# Patient Record
Sex: Female | Born: 1950
Health system: Southern US, Community
[De-identification: ages and names within clinical notes are randomized; demographics above are authoritative.]

## PROBLEM LIST (undated history)

## (undated) DIAGNOSIS — Z9119 Patient's noncompliance with other medical treatment and regimen: Secondary | ICD-10-CM

## (undated) DIAGNOSIS — K08109 Complete loss of teeth, unspecified cause, unspecified class: Secondary | ICD-10-CM

## (undated) DIAGNOSIS — R5383 Other fatigue: Secondary | ICD-10-CM

## (undated) DIAGNOSIS — E119 Type 2 diabetes mellitus without complications: Secondary | ICD-10-CM

## (undated) DIAGNOSIS — M549 Dorsalgia, unspecified: Secondary | ICD-10-CM

## (undated) DIAGNOSIS — R1011 Right upper quadrant pain: Secondary | ICD-10-CM

## (undated) DIAGNOSIS — I480 Paroxysmal atrial fibrillation: Secondary | ICD-10-CM

## (undated) DIAGNOSIS — R9431 Abnormal electrocardiogram [ECG] [EKG]: Secondary | ICD-10-CM

## (undated) DIAGNOSIS — Z91199 Patient's noncompliance with other medical treatment and regimen due to unspecified reason: Secondary | ICD-10-CM

## (undated) DIAGNOSIS — K219 Gastro-esophageal reflux disease without esophagitis: Secondary | ICD-10-CM

## (undated) DIAGNOSIS — I4892 Unspecified atrial flutter: Secondary | ICD-10-CM

## (undated) DIAGNOSIS — Z5189 Encounter for other specified aftercare: Secondary | ICD-10-CM

## (undated) DIAGNOSIS — Z973 Presence of spectacles and contact lenses: Secondary | ICD-10-CM

## (undated) DIAGNOSIS — E785 Hyperlipidemia, unspecified: Secondary | ICD-10-CM

## (undated) DIAGNOSIS — Z972 Presence of dental prosthetic device (complete) (partial): Secondary | ICD-10-CM

## (undated) DIAGNOSIS — I10 Essential (primary) hypertension: Secondary | ICD-10-CM

## (undated) DIAGNOSIS — J9601 Acute respiratory failure with hypoxia: Secondary | ICD-10-CM

## (undated) DIAGNOSIS — Z72820 Sleep deprivation: Secondary | ICD-10-CM

## (undated) DIAGNOSIS — J90 Pleural effusion, not elsewhere classified: Secondary | ICD-10-CM

## (undated) DIAGNOSIS — R21 Rash and other nonspecific skin eruption: Secondary | ICD-10-CM

## (undated) DIAGNOSIS — M1990 Unspecified osteoarthritis, unspecified site: Secondary | ICD-10-CM

## (undated) DIAGNOSIS — R160 Hepatomegaly, not elsewhere classified: Secondary | ICD-10-CM

## (undated) HISTORY — DX: Gastro-esophageal reflux disease without esophagitis: K21.9

## (undated) HISTORY — DX: Presence of dental prosthetic device (complete) (partial): Z97.2

## (undated) HISTORY — DX: Pleural effusion, not elsewhere classified: J90

## (undated) HISTORY — DX: Right upper quadrant pain: R10.11

## (undated) HISTORY — DX: Dorsalgia, unspecified: M54.9

## (undated) HISTORY — PX: BREAST EXCISIONAL BIOPSY: SUR124

## (undated) HISTORY — DX: Presence of dental prosthetic device (complete) (partial): K08.109

## (undated) HISTORY — PX: CATARACT EXTRACTION: SUR2

## (undated) HISTORY — PX: CARDIAC SURGERY: SHX584

## (undated) HISTORY — DX: Acute respiratory failure with hypoxia: J96.01

## (undated) HISTORY — DX: Presence of spectacles and contact lenses: Z97.3

## (undated) HISTORY — DX: Sleep deprivation: Z72.820

## (undated) HISTORY — DX: Other fatigue: R53.83

## (undated) HISTORY — PX: BREAST DUCTAL SYSTEM EXCISION: SHX5242

## (undated) HISTORY — PX: ESOPHAGEAL DILATION: SHX303

## (undated) HISTORY — DX: Abnormal electrocardiogram (ECG) (EKG): R94.31

## (undated) HISTORY — DX: Hyperlipidemia, unspecified: E78.5

## (undated) HISTORY — DX: Rash and other nonspecific skin eruption: R21

## (undated) HISTORY — DX: Unspecified osteoarthritis, unspecified site: M19.90

## (undated) HISTORY — PX: CHOLECYSTECTOMY: SHX55

## (undated) HISTORY — PX: ABDOMINAL HYSTERECTOMY: SHX81

## (undated) HISTORY — DX: Encounter for other specified aftercare: Z51.89

---

## 2006-02-09 ENCOUNTER — Ambulatory Visit (HOSPITAL_COMMUNITY): Admission: RE | Admit: 2006-02-09 | Discharge: 2006-02-09 | Payer: Self-pay | Admitting: Internal Medicine

## 2006-03-14 ENCOUNTER — Ambulatory Visit: Payer: Self-pay | Admitting: Internal Medicine

## 2006-03-17 ENCOUNTER — Ambulatory Visit: Payer: Self-pay | Admitting: Internal Medicine

## 2006-05-23 ENCOUNTER — Ambulatory Visit: Payer: Self-pay | Admitting: Internal Medicine

## 2007-02-05 ENCOUNTER — Emergency Department (HOSPITAL_COMMUNITY): Admission: EM | Admit: 2007-02-05 | Discharge: 2007-02-05 | Payer: Self-pay | Admitting: Emergency Medicine

## 2008-04-16 ENCOUNTER — Encounter: Admission: RE | Admit: 2008-04-16 | Discharge: 2008-04-16 | Payer: Self-pay | Admitting: Family Medicine

## 2008-06-18 ENCOUNTER — Encounter: Admission: RE | Admit: 2008-06-18 | Discharge: 2008-06-18 | Payer: Self-pay | Admitting: Family Medicine

## 2008-07-04 ENCOUNTER — Encounter: Admission: RE | Admit: 2008-07-04 | Discharge: 2008-07-04 | Payer: Self-pay | Admitting: Family Medicine

## 2008-09-12 ENCOUNTER — Encounter: Admission: RE | Admit: 2008-09-12 | Discharge: 2008-09-12 | Payer: Self-pay | Admitting: Family Medicine

## 2008-12-12 ENCOUNTER — Encounter: Admission: RE | Admit: 2008-12-12 | Discharge: 2009-01-08 | Payer: Self-pay | Admitting: Family Medicine

## 2009-03-27 ENCOUNTER — Encounter: Admission: RE | Admit: 2009-03-27 | Discharge: 2009-03-27 | Payer: Self-pay | Admitting: Family Medicine

## 2009-05-29 ENCOUNTER — Encounter: Admission: RE | Admit: 2009-05-29 | Discharge: 2009-05-29 | Payer: Self-pay | Admitting: Family Medicine

## 2009-06-23 ENCOUNTER — Encounter: Admission: RE | Admit: 2009-06-23 | Discharge: 2009-06-23 | Payer: Self-pay | Admitting: Family Medicine

## 2009-07-28 ENCOUNTER — Emergency Department (HOSPITAL_COMMUNITY): Admission: EM | Admit: 2009-07-28 | Discharge: 2009-07-28 | Payer: Self-pay | Admitting: Emergency Medicine

## 2009-09-23 ENCOUNTER — Encounter: Admission: RE | Admit: 2009-09-23 | Discharge: 2009-10-10 | Payer: Self-pay | Admitting: Family Medicine

## 2009-12-24 ENCOUNTER — Encounter
Admission: RE | Admit: 2009-12-24 | Discharge: 2010-02-10 | Payer: Self-pay | Source: Home / Self Care | Attending: Family Medicine | Admitting: Family Medicine

## 2010-02-01 ENCOUNTER — Encounter: Payer: Self-pay | Admitting: Internal Medicine

## 2010-02-22 ENCOUNTER — Emergency Department (HOSPITAL_COMMUNITY): Payer: Medicare Other

## 2010-02-22 ENCOUNTER — Emergency Department (HOSPITAL_COMMUNITY)
Admission: EM | Admit: 2010-02-22 | Discharge: 2010-02-22 | Disposition: A | Discharge status: Home / Self Care | Payer: Medicare Other | Attending: Emergency Medicine | Admitting: Emergency Medicine

## 2010-02-22 DIAGNOSIS — J40 Bronchitis, not specified as acute or chronic: Secondary | ICD-10-CM | POA: Insufficient documentation

## 2010-02-22 DIAGNOSIS — R509 Fever, unspecified: Secondary | ICD-10-CM | POA: Insufficient documentation

## 2010-02-22 DIAGNOSIS — R000 Tachycardia, unspecified: Secondary | ICD-10-CM | POA: Insufficient documentation

## 2010-02-22 DIAGNOSIS — E119 Type 2 diabetes mellitus without complications: Secondary | ICD-10-CM | POA: Insufficient documentation

## 2010-02-22 DIAGNOSIS — I10 Essential (primary) hypertension: Secondary | ICD-10-CM | POA: Insufficient documentation

## 2010-02-22 LAB — URINE MICROSCOPIC-ADD ON

## 2010-02-22 LAB — DIFFERENTIAL
Basophils Relative: 0 % (ref 0–1)
Eosinophils Absolute: 0.2 10*3/uL (ref 0.0–0.7)
Eosinophils Relative: 1 % (ref 0–5)
Lymphocytes Relative: 14 % (ref 12–46)
Neutro Abs: 12.4 10*3/uL — ABNORMAL HIGH (ref 1.7–7.7)

## 2010-02-22 LAB — BASIC METABOLIC PANEL
BUN: 17 mg/dL (ref 6–23)
CO2: 26 mEq/L (ref 19–32)
GFR calc Af Amer: 59 mL/min — ABNORMAL LOW (ref >60)
Potassium: 3.9 mEq/L (ref 3.5–5.1)

## 2010-02-22 LAB — CBC
MCV: 76.8 fL — ABNORMAL LOW (ref 78.0–100.0)
RBC: 4.23 MIL/uL (ref 3.87–5.11)
WBC: 16.8 10*3/uL — ABNORMAL HIGH (ref 4.0–10.5)

## 2010-02-22 LAB — URINALYSIS, ROUTINE W REFLEX MICROSCOPIC
Hgb urine dipstick: NEGATIVE
Leukocytes, UA: NEGATIVE
Protein, ur: 30 mg/dL — AB
pH: 5 (ref 5.0–8.0)

## 2010-03-05 ENCOUNTER — Encounter (HOSPITAL_COMMUNITY): Payer: Self-pay | Admitting: Radiology

## 2010-03-05 ENCOUNTER — Emergency Department (HOSPITAL_COMMUNITY): Payer: Medicare Other

## 2010-03-05 ENCOUNTER — Inpatient Hospital Stay (INDEPENDENT_AMBULATORY_CARE_PROVIDER_SITE_OTHER)
Admission: RE | Admit: 2010-03-05 | Discharge: 2010-03-05 | Disposition: A | Discharge status: Home / Self Care | Payer: Medicare Other | Source: Ambulatory Visit

## 2010-03-05 ENCOUNTER — Inpatient Hospital Stay (HOSPITAL_COMMUNITY)
Admission: EM | Admit: 2010-03-05 | Discharge: 2010-03-17 | DRG: 871 | Disposition: A | Discharge status: Other Acute Inpatient Hospital | Payer: Medicare Other | Attending: Internal Medicine | Admitting: Internal Medicine

## 2010-03-05 DIAGNOSIS — Z23 Encounter for immunization: Secondary | ICD-10-CM

## 2010-03-05 DIAGNOSIS — N12 Tubulo-interstitial nephritis, not specified as acute or chronic: Secondary | ICD-10-CM | POA: Diagnosis present

## 2010-03-05 DIAGNOSIS — G20A1 Parkinson's disease without dyskinesia, without mention of fluctuations: Secondary | ICD-10-CM | POA: Diagnosis present

## 2010-03-05 DIAGNOSIS — K8020 Calculus of gallbladder without cholecystitis without obstruction: Secondary | ICD-10-CM | POA: Diagnosis present

## 2010-03-05 DIAGNOSIS — F319 Bipolar disorder, unspecified: Secondary | ICD-10-CM | POA: Diagnosis present

## 2010-03-05 DIAGNOSIS — M6281 Muscle weakness (generalized): Secondary | ICD-10-CM

## 2010-03-05 DIAGNOSIS — Z794 Long term (current) use of insulin: Secondary | ICD-10-CM

## 2010-03-05 DIAGNOSIS — I209 Angina pectoris, unspecified: Secondary | ICD-10-CM | POA: Diagnosis present

## 2010-03-05 DIAGNOSIS — R7989 Other specified abnormal findings of blood chemistry: Secondary | ICD-10-CM | POA: Diagnosis present

## 2010-03-05 DIAGNOSIS — I319 Disease of pericardium, unspecified: Secondary | ICD-10-CM | POA: Diagnosis present

## 2010-03-05 DIAGNOSIS — M25519 Pain in unspecified shoulder: Secondary | ICD-10-CM | POA: Diagnosis present

## 2010-03-05 DIAGNOSIS — K219 Gastro-esophageal reflux disease without esophagitis: Secondary | ICD-10-CM | POA: Diagnosis present

## 2010-03-05 DIAGNOSIS — E785 Hyperlipidemia, unspecified: Secondary | ICD-10-CM | POA: Diagnosis present

## 2010-03-05 DIAGNOSIS — K560 Paralytic ileus: Secondary | ICD-10-CM | POA: Diagnosis not present

## 2010-03-05 DIAGNOSIS — I959 Hypotension, unspecified: Secondary | ICD-10-CM

## 2010-03-05 DIAGNOSIS — R000 Tachycardia, unspecified: Secondary | ICD-10-CM | POA: Diagnosis not present

## 2010-03-05 DIAGNOSIS — I10 Essential (primary) hypertension: Secondary | ICD-10-CM | POA: Diagnosis present

## 2010-03-05 DIAGNOSIS — A419 Sepsis, unspecified organism: Secondary | ICD-10-CM | POA: Diagnosis present

## 2010-03-05 DIAGNOSIS — K659 Peritonitis, unspecified: Secondary | ICD-10-CM | POA: Diagnosis not present

## 2010-03-05 DIAGNOSIS — J9819 Other pulmonary collapse: Secondary | ICD-10-CM | POA: Diagnosis present

## 2010-03-05 DIAGNOSIS — R627 Adult failure to thrive: Secondary | ICD-10-CM | POA: Diagnosis present

## 2010-03-05 DIAGNOSIS — J9600 Acute respiratory failure, unspecified whether with hypoxia or hypercapnia: Secondary | ICD-10-CM | POA: Diagnosis present

## 2010-03-05 DIAGNOSIS — E860 Dehydration: Secondary | ICD-10-CM | POA: Diagnosis present

## 2010-03-05 DIAGNOSIS — E119 Type 2 diabetes mellitus without complications: Secondary | ICD-10-CM | POA: Diagnosis present

## 2010-03-05 DIAGNOSIS — K750 Abscess of liver: Secondary | ICD-10-CM | POA: Diagnosis present

## 2010-03-05 DIAGNOSIS — G20 Parkinson's disease: Secondary | ICD-10-CM | POA: Diagnosis present

## 2010-03-05 DIAGNOSIS — J189 Pneumonia, unspecified organism: Secondary | ICD-10-CM | POA: Diagnosis present

## 2010-03-05 DIAGNOSIS — D72829 Elevated white blood cell count, unspecified: Secondary | ICD-10-CM | POA: Diagnosis present

## 2010-03-05 DIAGNOSIS — D509 Iron deficiency anemia, unspecified: Secondary | ICD-10-CM | POA: Diagnosis present

## 2010-03-05 DIAGNOSIS — R079 Chest pain, unspecified: Secondary | ICD-10-CM

## 2010-03-05 LAB — URINALYSIS, ROUTINE W REFLEX MICROSCOPIC
Bilirubin Urine: NEGATIVE
Hgb urine dipstick: NEGATIVE
Urine Glucose, Fasting: NEGATIVE mg/dL
pH: 5 (ref 5.0–8.0)

## 2010-03-05 LAB — COMPREHENSIVE METABOLIC PANEL
ALT: 55 U/L — ABNORMAL HIGH (ref 0–35)
Alkaline Phosphatase: 108 U/L (ref 39–117)
GFR calc non Af Amer: 33 mL/min — ABNORMAL LOW (ref >60)
Glucose, Bld: 139 mg/dL — ABNORMAL HIGH (ref 70–99)
Total Bilirubin: 0.2 mg/dL — ABNORMAL LOW (ref 0.3–1.2)

## 2010-03-05 LAB — AMYLASE: Amylase: 18 U/L (ref 0–105)

## 2010-03-05 LAB — POCT CARDIAC MARKERS
CKMB, poc: 1.5 ng/mL (ref 1.0–8.0)
Myoglobin, poc: 194 ng/mL (ref 12–200)
Troponin i, poc: <0.05 ng/mL (ref 0.00–0.09)

## 2010-03-05 LAB — URINE MICROSCOPIC-ADD ON

## 2010-03-05 LAB — DIFFERENTIAL
Basophils Relative: 0 % (ref 0–1)
Eosinophils Absolute: 0.1 10*3/uL (ref 0.0–0.7)
Lymphocytes Relative: 9 % — ABNORMAL LOW (ref 12–46)
Lymphs Abs: 2.3 10*3/uL (ref 0.7–4.0)
Monocytes Relative: 6 % (ref 3–12)
Neutro Abs: 20.3 10*3/uL — ABNORMAL HIGH (ref 1.7–7.7)
Neutrophils Relative %: 84 % — ABNORMAL HIGH (ref 43–77)

## 2010-03-05 LAB — CBC
Hemoglobin: 9.2 g/dL — ABNORMAL LOW (ref 12.0–15.0)
MCH: 25.2 pg — ABNORMAL LOW (ref 26.0–34.0)
MCV: 75.9 fL — ABNORMAL LOW (ref 78.0–100.0)
Platelets: 675 10*3/uL — ABNORMAL HIGH (ref 150–400)

## 2010-03-05 LAB — LIPASE, BLOOD: Lipase: 16 U/L (ref 11–59)

## 2010-03-05 LAB — GLUCOSE, CAPILLARY: Glucose-Capillary: 178 mg/dL — ABNORMAL HIGH (ref 70–99)

## 2010-03-06 ENCOUNTER — Inpatient Hospital Stay (HOSPITAL_COMMUNITY): Payer: Medicare Other

## 2010-03-06 DIAGNOSIS — I319 Disease of pericardium, unspecified: Secondary | ICD-10-CM

## 2010-03-06 LAB — DIFFERENTIAL
Lymphocytes Relative: 11 % — ABNORMAL LOW (ref 12–46)
Lymphs Abs: 2.2 10*3/uL (ref 0.7–4.0)
Neutro Abs: 16 10*3/uL — ABNORMAL HIGH (ref 1.7–7.7)
Neutrophils Relative %: 80 % — ABNORMAL HIGH (ref 43–77)

## 2010-03-06 LAB — PROTIME-INR
INR: 1.4 (ref 0.00–1.49)
Prothrombin Time: 17.4 seconds — ABNORMAL HIGH (ref 11.6–15.2)

## 2010-03-06 LAB — GLUCOSE, CAPILLARY
Glucose-Capillary: 106 mg/dL — ABNORMAL HIGH (ref 70–99)
Glucose-Capillary: 84 mg/dL (ref 70–99)

## 2010-03-06 LAB — CBC
HCT: 25.2 % — ABNORMAL LOW (ref 36.0–46.0)
Hemoglobin: 8.3 g/dL — ABNORMAL LOW (ref 12.0–15.0)
MCV: 75.7 fL — ABNORMAL LOW (ref 78.0–100.0)
RBC: 3.33 MIL/uL — ABNORMAL LOW (ref 3.87–5.11)
WBC: 20 10*3/uL — ABNORMAL HIGH (ref 4.0–10.5)

## 2010-03-06 LAB — COMPREHENSIVE METABOLIC PANEL
AST: 55 U/L — ABNORMAL HIGH (ref 0–37)
CO2: 26 mEq/L (ref 19–32)
Calcium: 8.2 mg/dL — ABNORMAL LOW (ref 8.4–10.5)
Creatinine, Ser: 1.04 mg/dL (ref 0.4–1.2)
GFR calc Af Amer: >60 mL/min (ref >60)
GFR calc non Af Amer: 54 mL/min — ABNORMAL LOW (ref >60)
Glucose, Bld: 97 mg/dL (ref 70–99)

## 2010-03-06 LAB — URINE CULTURE: Culture  Setup Time: 201202231847

## 2010-03-06 LAB — APTT: aPTT: 38 seconds — ABNORMAL HIGH (ref 24–37)

## 2010-03-06 MED ORDER — GADOBENATE DIMEGLUMINE 529 MG/ML IV SOLN
20.0000 mL | Freq: Once | INTRAVENOUS | Status: AC
  Administered 2010-03-06: 20 mL via INTRAVENOUS

## 2010-03-07 LAB — DIFFERENTIAL
Basophils Relative: 0 % (ref 0–1)
Eosinophils Absolute: 0.2 10*3/uL (ref 0.0–0.7)
Eosinophils Relative: 1 % (ref 0–5)
Lymphocytes Relative: 10 % — ABNORMAL LOW (ref 12–46)
Neutrophils Relative %: 80 % — ABNORMAL HIGH (ref 43–77)

## 2010-03-07 LAB — BASIC METABOLIC PANEL
GFR calc Af Amer: >60 mL/min (ref >60)
GFR calc non Af Amer: >60 mL/min (ref >60)
Glucose, Bld: 143 mg/dL — ABNORMAL HIGH (ref 70–99)
Potassium: 4.2 mEq/L (ref 3.5–5.1)
Sodium: 136 mEq/L (ref 135–145)

## 2010-03-07 LAB — GLUCOSE, CAPILLARY
Glucose-Capillary: 104 mg/dL — ABNORMAL HIGH (ref 70–99)
Glucose-Capillary: 211 mg/dL — ABNORMAL HIGH (ref 70–99)
Glucose-Capillary: 165 mg/dL — ABNORMAL HIGH (ref 70–99)

## 2010-03-07 LAB — CBC
HCT: 23.4 % — ABNORMAL LOW (ref 36.0–46.0)
RDW: 14 % (ref 11.5–15.5)
WBC: 18.3 10*3/uL — ABNORMAL HIGH (ref 4.0–10.5)

## 2010-03-08 ENCOUNTER — Inpatient Hospital Stay (HOSPITAL_COMMUNITY): Payer: Medicare Other

## 2010-03-08 ENCOUNTER — Other Ambulatory Visit: Payer: Self-pay | Admitting: Diagnostic Radiology

## 2010-03-08 DIAGNOSIS — J9600 Acute respiratory failure, unspecified whether with hypoxia or hypercapnia: Secondary | ICD-10-CM

## 2010-03-08 DIAGNOSIS — A419 Sepsis, unspecified organism: Secondary | ICD-10-CM

## 2010-03-08 DIAGNOSIS — K750 Abscess of liver: Secondary | ICD-10-CM

## 2010-03-08 LAB — COMPREHENSIVE METABOLIC PANEL
ALT: 29 U/L (ref 0–35)
Alkaline Phosphatase: 100 U/L (ref 39–117)
Alkaline Phosphatase: 102 U/L (ref 39–117)
BUN: 4 mg/dL — ABNORMAL LOW (ref 6–23)
BUN: 4 mg/dL — ABNORMAL LOW (ref 6–23)
CO2: 26 mEq/L (ref 19–32)
CO2: 24 mEq/L (ref 19–32)
Chloride: 102 mEq/L (ref 96–112)
Chloride: 102 mEq/L (ref 96–112)
Creatinine, Ser: 0.71 mg/dL (ref 0.4–1.2)
GFR calc non Af Amer: >60 mL/min (ref >60)
Glucose, Bld: 208 mg/dL — ABNORMAL HIGH (ref 70–99)
Glucose, Bld: 236 mg/dL — ABNORMAL HIGH (ref 70–99)
Potassium: 4.3 mEq/L (ref 3.5–5.1)
Potassium: 4.3 mEq/L (ref 3.5–5.1)
Sodium: 135 mEq/L (ref 135–145)
Total Bilirubin: 0.2 mg/dL — ABNORMAL LOW (ref 0.3–1.2)
Total Bilirubin: 0.5 mg/dL (ref 0.3–1.2)
Total Protein: 6.1 g/dL (ref 6.0–8.3)

## 2010-03-08 LAB — LIPASE, BLOOD: Lipase: 19 U/L (ref 11–59)

## 2010-03-08 LAB — DIFFERENTIAL
Basophils Relative: 0 % (ref 0–1)
Eosinophils Absolute: 0.2 10*3/uL (ref 0.0–0.7)
Lymphs Abs: 1.8 10*3/uL (ref 0.7–4.0)
Lymphs Abs: 1.3 10*3/uL (ref 0.7–4.0)
Monocytes Absolute: 1.5 10*3/uL — ABNORMAL HIGH (ref 0.1–1.0)
Monocytes Absolute: 0.6 10*3/uL (ref 0.1–1.0)
Monocytes Relative: 9 % (ref 3–12)
Monocytes Relative: 3 % (ref 3–12)
Neutro Abs: 18.8 10*3/uL — ABNORMAL HIGH (ref 1.7–7.7)
Neutrophils Relative %: 91 % — ABNORMAL HIGH (ref 43–77)

## 2010-03-08 LAB — CBC
HCT: 27.1 % — ABNORMAL LOW (ref 36.0–46.0)
Hemoglobin: 8 g/dL — ABNORMAL LOW (ref 12.0–15.0)
Hemoglobin: 8.9 g/dL — ABNORMAL LOW (ref 12.0–15.0)
MCH: 25.2 pg — ABNORMAL LOW (ref 26.0–34.0)
MCH: 25.2 pg — ABNORMAL LOW (ref 26.0–34.0)
MCHC: 32.5 g/dL (ref 30.0–36.0)
MCHC: 32.8 g/dL (ref 30.0–36.0)
MCV: 77.6 fL — ABNORMAL LOW (ref 78.0–100.0)
MCV: 76.8 fL — ABNORMAL LOW (ref 78.0–100.0)
Platelets: 588 10*3/uL — ABNORMAL HIGH (ref 150–400)
RBC: 3.17 MIL/uL — ABNORMAL LOW (ref 3.87–5.11)
RBC: 3.53 MIL/uL — ABNORMAL LOW (ref 3.87–5.11)

## 2010-03-08 LAB — IRON AND TIBC
Iron: 10 ug/dL — ABNORMAL LOW (ref 42–135)
Saturation Ratios: 7 % — ABNORMAL LOW (ref 20–55)
UIBC: 133 ug/dL

## 2010-03-08 LAB — RETICULOCYTES: Retic Ct Pct: 1.3 % (ref 0.4–3.1)

## 2010-03-08 LAB — GLUCOSE, CAPILLARY: Glucose-Capillary: 164 mg/dL — ABNORMAL HIGH (ref 70–99)

## 2010-03-08 LAB — FERRITIN: Ferritin: 820 ng/mL — ABNORMAL HIGH (ref 10–291)

## 2010-03-08 LAB — D-DIMER, QUANTITATIVE: D-Dimer, Quant: 4.56 ug/mL-FEU — ABNORMAL HIGH (ref 0.00–0.48)

## 2010-03-08 MED ORDER — IOHEXOL 300 MG/ML  SOLN
80.0000 mL | Freq: Once | INTRAMUSCULAR | Status: AC | PRN
  Administered 2010-03-08: 80 mL via INTRAVENOUS

## 2010-03-09 ENCOUNTER — Inpatient Hospital Stay (HOSPITAL_COMMUNITY): Payer: Medicare Other

## 2010-03-09 DIAGNOSIS — K750 Abscess of liver: Secondary | ICD-10-CM

## 2010-03-09 LAB — GLUCOSE, CAPILLARY
Glucose-Capillary: 146 mg/dL — ABNORMAL HIGH (ref 70–99)
Glucose-Capillary: 176 mg/dL — ABNORMAL HIGH (ref 70–99)
Glucose-Capillary: 165 mg/dL — ABNORMAL HIGH (ref 70–99)
Glucose-Capillary: 262 mg/dL — ABNORMAL HIGH (ref 70–99)

## 2010-03-09 LAB — CBC
HCT: 27.5 % — ABNORMAL LOW (ref 36.0–46.0)
MCH: 25 pg — ABNORMAL LOW (ref 26.0–34.0)
MCHC: 32.4 g/dL (ref 30.0–36.0)
MCHC: 32 g/dL (ref 30.0–36.0)
MCV: 77.2 fL — ABNORMAL LOW (ref 78.0–100.0)
Platelets: 582 10*3/uL — ABNORMAL HIGH (ref 150–400)
Platelets: 563 10*3/uL — ABNORMAL HIGH (ref 150–400)
RDW: 14 % (ref 11.5–15.5)
RDW: 14.1 % (ref 11.5–15.5)

## 2010-03-09 LAB — BASIC METABOLIC PANEL
BUN: 3 mg/dL — ABNORMAL LOW (ref 6–23)
CO2: 25 mEq/L (ref 19–32)
Calcium: 7.4 mg/dL — ABNORMAL LOW (ref 8.4–10.5)
Creatinine, Ser: 0.69 mg/dL (ref 0.4–1.2)
GFR calc Af Amer: >60 mL/min (ref >60)

## 2010-03-10 DIAGNOSIS — K750 Abscess of liver: Secondary | ICD-10-CM

## 2010-03-10 LAB — COMPREHENSIVE METABOLIC PANEL
AST: 22 U/L (ref 0–37)
Albumin: 1.4 g/dL — ABNORMAL LOW (ref 3.5–5.2)
Alkaline Phosphatase: 85 U/L (ref 39–117)
BUN: 6 mg/dL (ref 6–23)
CO2: 26 mEq/L (ref 19–32)
Chloride: 107 mEq/L (ref 96–112)
Creatinine, Ser: 0.74 mg/dL (ref 0.4–1.2)
GFR calc non Af Amer: >60 mL/min (ref >60)
Potassium: 3.9 mEq/L (ref 3.5–5.1)
Total Bilirubin: 0.4 mg/dL (ref 0.3–1.2)

## 2010-03-10 LAB — CBC
HCT: 26.7 % — ABNORMAL LOW (ref 36.0–46.0)
Hemoglobin: 8.3 g/dL — ABNORMAL LOW (ref 12.0–15.0)
Hemoglobin: 8.6 g/dL — ABNORMAL LOW (ref 12.0–15.0)
MCH: 24.1 pg — ABNORMAL LOW (ref 26.0–34.0)
MCH: 24.3 pg — ABNORMAL LOW (ref 26.0–34.0)
MCHC: 31.1 g/dL (ref 30.0–36.0)
MCHC: 31.3 g/dL (ref 30.0–36.0)
Platelets: 549 10*3/uL — ABNORMAL HIGH (ref 150–400)
RBC: 3.44 MIL/uL — ABNORMAL LOW (ref 3.87–5.11)
RDW: 14.4 % (ref 11.5–15.5)

## 2010-03-10 LAB — URINE MICROSCOPIC-ADD ON

## 2010-03-10 LAB — GLUCOSE, CAPILLARY
Glucose-Capillary: 218 mg/dL — ABNORMAL HIGH (ref 70–99)
Glucose-Capillary: 190 mg/dL — ABNORMAL HIGH (ref 70–99)
Glucose-Capillary: 178 mg/dL — ABNORMAL HIGH (ref 70–99)

## 2010-03-10 LAB — URINALYSIS, ROUTINE W REFLEX MICROSCOPIC
Hgb urine dipstick: NEGATIVE
Ketones, ur: 15 mg/dL — AB
Protein, ur: 30 mg/dL — AB
Urine Glucose, Fasting: NEGATIVE mg/dL
Urobilinogen, UA: 0.2 mg/dL (ref 0.0–1.0)

## 2010-03-10 NOTE — Progress Notes (Signed)
NAMENOELLY, LASSEIGNE NO.:  [id number]  MEDICAL RECORD NO.:  [id number]           PATIENT TYPE:  I  LOCATION:  2113                         FACILITY:  [facility]  PHYSICIAN:  Marinda Elk, M.D.DATE OF BIRTH:  26-Aug-1950                                PROGRESS NOTE   ADMITTING PHYSICIAN: Homero Fellers, MD with [facility].  PRIMARY CARE PHYSICIAN: Betti D. Pecola Leisure, MD  CONSULTANT FOR THIS HOSPITALIZATION: 1. Gabrielle Dare Janee Morn, MD with General Surgery. 2. Kalman Shan, MD with Pulmonary Critical Care Medicine. 3. Lacretia Leigh. Ninetta Lights, MD with Infectious Disease. 4. Interventional Radiology.  CHIEF COMPLAINT AND REASON FOR ADMISSION: Ms. Corbit is a 60 year old female patient who endorses having sickness symptoms for a week and half, was initially diagnosed with pneumonia February 21, 2010, took Levaquin for this diagnosis.  She represented back to the hospital on the day of admission because of abdominal pain, chest pain, weakness and anorexia.  The abdominal pain was localized to the right upper quadrant epigastric region.  This was associated with vomiting and diarrhea.  She also endorsed shortness of breath and mild nonproductive cough.  In the ER, CT of the abdomen and pelvis showed a 10.5-cm right hepatic mass versus abscess as well as moderate pericardial effusion and a small amount of pelvic ascites.  Her chest x- ray in the ER demonstrated worsening bilateral atelectasis or infiltrate.  The patient was hypoxic mildly but was afebrile.  She had significant white count of 24,000 with a left shift.  While in the ER General Surgery evaluated the patient as a consultant that requested internal medicine admit the patient.  PHYSICAL EXAMINATION: GENERAL:  The patient was found to be weak appearing, mildly dehydrated but in no obvious distress. VITAL SIGNS:  BP was 101/53, pulse 107, respirations 28, temperature 98.3.  Exam was unremarkable  except for the following findings. HEENT:  Oral mucous membranes were dry. ABDOMEN:  Distended, diffusely tender with focal tenderness in right upper quadrant epigastric guarding, questionable mild rebounding.  Her urinalysis was possibly consistent with urinary tract infection. Her white count was 24,000 with a left shift, hemoglobin 9.2, platelet count 675.  Sodium 137, potassium 4.1, BUN 31, creatinine 1.6.  LFTs were mildly elevated with an AST of 58, ALT of 55, total bilirubin 0.2. Amylase was normal.  Her plain abdominal x-ray in addition, showed a normal bowel gas pattern without any evidence of obstruction.  PAST MEDICAL HISTORY: 1. Diabetes mellitus type 2 on oral hypoglycemic agents. 2. Hypertension. 3. Bipolar disorder. 4. GERD. 5. Dyslipidemia. 6. Parkinson's disease. 7. Prior lumpectomy of the breast.  ADMITTING DIAGNOSES: 1. Leukocytosis, possible early sepsis. 2. Abdominal pain with associated 10 cm hepatic mass versus abscess. 3. Mild hypoxemic respiratory failure, question early community-     acquired pneumonia. 4. Moderate pericardial effusion seen on CT.  No symptoms. 5. Possible urinary tract infection. 6. Mild anemia, nonspecified. 7. Diabetes, moderate controlled. 8. Volume depletion and dehydration. 9. Transaminitis of uncertain etiology.  DIAGNOSTICS: 1. Abdominal x-ray, March 05, 2010, shows progression in bibasilar     airspace disease which could be atelectasis and pneumonia  with a     normal bowel gas pattern.  CT of the abdomen and pelvis on March 05, 2010 noncontrasted shows a 10.5 cm right hepatic mass or     abscess with recommendations for MRI to characterize the area.     Moderate pericardial effusion with small pleural effusions.  Small     amount of pelvic ascites.  Cholelithiasis. 2. Portable chest x-ray, March 06, 2010 shows very low lung     volumes, increase in bibasilar opacities likely atelectasis,     cardiomegaly  with vascular congestion. 3. MRI of the liver with and without contrast March 07, 2010 that     shows an 11 cm predominantly cystic lesion in the posterior segment     of the right hepatic lobe with a regular rim enhancement and a few     thickened enhancing internal septations.  This is consistent with     hepatic abscess with less likely differential including biliary     cyst adenoma or necrotic neoplasm.  In addition, there was     cholelithiasis without evidence of cholecystitis or biliary     dilatation. 4. CT angio of the chest on March 08, 2010 that shows a moderate     pericardial effusion primarily adjacent to the left ventricle,     moderate right pleural effusion, bibasilar atelectasis and no PE. 5. CT of the abdomen and pelvis March 08, 2010 that shows new     drainage catheter in the abscess in the liver with decreased free     fluid in the pelvis and this completely resolved.  Gallstones are     stable.  Otherwise, benign-appearing abdomen.  Slight increase in     right pleural effusion.  Stable small left effusion and moderate     pericardial effusion.  LABORATORY DATA: MRSA PCR screening was negative.  Lactic acid at presentation was normal at 0.9.  Lipase normal at 16.  PTT was mildly elevated at 38.  PT 17.4, anion of 1.4.  Urine culture showed no growth from March 05, 2010. Blood cultures from March 05, 2010 showed no growth.  D-dimer March 08, 2010 was elevated 4.56.  Anemia panel showed low iron at 10, TIBC of 143, percent sat 7, UIBC 133, vitamin B12 803, serum folate 9.1 and ferritin 820.  Repeat lactic acid March 08, 2010 was 1.0 with an amylase of 18.  LFTs at presentation were mildly elevated and had normalized by March 08, 2010 with a total bilirubin 0.5, alkaline phosphatase 102, AST 29, ALT 29.  Culture and fungus smears from the liver abscess aspiration showed no growth to date.  Anaerobic cultures showed gram-positive cocci in  pairs and chains.  HIV antibody was checked and it was nonreactive.  Repeat urinalysis on March 10, 2010 shows wbc's 3-6, rare yeast, positive nitrites, small amount of leukocytes, not consistent with a urinary tract infection and as of today sodium is 141, potassium 3.9, chloride 107, CO2 26, glucose 136, BUN 6, creatinine 0.74, total bilirubin 0.4, alkaline phosphatase 85, AST 22, ALT 19.  White count still elevated at 19,400 but trending downward, hemoglobin 8.3, hematocrit 26.7 and platelets 554,000.  HOSPITAL COURSE: 1. A 10.5-cm hepatic abscess.  The patient presented with failure to     thrive and fever and abdominal pain what was later clarified to be     hepatic abscess.  The patient has subsequently undergone     interventional radiology aspiration and  placement of a drain in     the abcess.  Cultures are pending and she is on empiric Unasyn.     Infectious Disease is assisting with the management.     Interventional Radiology recommends repeating a CT to determine if     drain is ready to come out once output is less than 10 mL in the 24     hours. 2. Ileus with mild peritonitis.  The patient has not had frank of     peritonitis or surgical abdomen of this hospitalization.  General     Surgery is following along with [place] and currently advancing the     diet.  The patient remains distended with hypoactive bowel sounds     and endorses early satiety with attempts to eat.  We are giving her     a Dulcolax suppository x1 today.  She is passing flatus but has not     had a bowel movement since admission. 3. Mild hypoxemic respiratory failure with a right pleural effusion.     The patient has been having issues with mild tachypnea, possibly     exacerbated by increasing abdominal girth from the previously     mentioned ileus.  She also has a right pleural effusion.  We will     check a 2-view chest x-ray in the morning with decubitus views to     determine if this is a layering  pleural effusion and might benefit     from thoracentesis.  The patient is currently on room air     maintaining sats of 95-98%. 4. Moderate pericardial effusion.  On initial CT, the patient was     found to have a moderate pericardial effusion.  This was further     characterized on echocardiogram from March 06, 2010 that     demonstrated predominantly posterior and lateral pericardial     effusion with mild RA collapse.  This was obtained when the patient     was clinically dehydrated and now she is somewhat euvolemic.  She     has persistent tachycardia.  Preadmission labs were reviewed and     she was not on beta blockers.  The etiology to the persistent     tachycardia, but any event this is related to the effusion and it     has been several days since rehab.  I have evaluated the patient.     We will go ahead and obtain an another 2-D echocardiogram today to     make sure the effusion is stable or possibly improving. 5. Dehydration and volume depletion.  This is a multifactorial problem     secondary to poor intake before admission as well as utilization of     combination ACE inhibitor and hydrochlorothiazide prior to     admission.  She did have some mild renal insufficiency at     presentation but this has resolved.  Due to poor p.o. intake     related to ileus, we will continue low volume IV fluids.  Suspect    due to the intra-abdominal process, she may be third spacing fluids     and volume particularly in the bowels themselves and this may be     contributing to be ileus problem. 6. Diabetes 2, currently the patient is maintaining stable CBGs on     sliding-scale insulin.  At home, she was on metformin, glyburide     and Januvia. 7. Hypertension.  The patient's  blood pressure is moderately     controlled here.  She is off medications. 8. Iron-deficiency anemia.  The patient presented with low-grade     anemia at 9.5 while dehydrated and after rehydration hemoglobin is      in the 8 range.  Serum iron was low.  Once the patient is able to     tolerate oral diet better recommend begin oral repletion with iron.  DISPOSITION: At the present time, the patient is appropriate to transfer to a telemetry bed. We  will continue telemetry to monitor the tachycardia as well as continue to monitor from a cardiac standpoint given the pericardial effusion.     [name] L. Rennis Harding, N.P.   ______________________________ Marinda Elk, M.D.    ALE/MEDQ  D:  03/10/2010  T:  03/10/2010  Job:  [id number]  Electronically Signed by Junious Silk N.P. on 03/10/2010 02:49:52 PM Electronically Signed by Lambert Keto M.D. on 03/10/2010 04:40:02 PM

## 2010-03-11 ENCOUNTER — Inpatient Hospital Stay (HOSPITAL_COMMUNITY): Payer: Medicare Other

## 2010-03-11 DIAGNOSIS — I319 Disease of pericardium, unspecified: Secondary | ICD-10-CM

## 2010-03-11 LAB — CBC
HCT: 26.8 % — ABNORMAL LOW (ref 36.0–46.0)
MCH: 24.4 pg — ABNORMAL LOW (ref 26.0–34.0)
MCHC: 30.6 g/dL (ref 30.0–36.0)
MCV: 78.3 fL (ref 78.0–100.0)
Platelets: 539 10*3/uL — ABNORMAL HIGH (ref 150–400)
Platelets: 536 10*3/uL — ABNORMAL HIGH (ref 150–400)
RBC: 3.32 MIL/uL — ABNORMAL LOW (ref 3.87–5.11)
RDW: 14.4 % (ref 11.5–15.5)
RDW: 14.5 % (ref 11.5–15.5)
WBC: 18.6 10*3/uL — ABNORMAL HIGH (ref 4.0–10.5)

## 2010-03-11 LAB — GLUCOSE, CAPILLARY: Glucose-Capillary: 211 mg/dL — ABNORMAL HIGH (ref 70–99)

## 2010-03-12 LAB — BASIC METABOLIC PANEL
BUN: 5 mg/dL — ABNORMAL LOW (ref 6–23)
Calcium: 8.3 mg/dL — ABNORMAL LOW (ref 8.4–10.5)
Chloride: 104 mEq/L (ref 96–112)
Creatinine, Ser: 0.73 mg/dL (ref 0.4–1.2)
GFR calc Af Amer: >60 mL/min (ref >60)
GFR calc non Af Amer: >60 mL/min (ref >60)

## 2010-03-12 LAB — CBC
MCV: 80.1 fL (ref 78.0–100.0)
Platelets: 543 10*3/uL — ABNORMAL HIGH (ref 150–400)
RBC: 3.47 MIL/uL — ABNORMAL LOW (ref 3.87–5.11)
RDW: 14.7 % (ref 11.5–15.5)
WBC: 17.7 10*3/uL — ABNORMAL HIGH (ref 4.0–10.5)

## 2010-03-12 LAB — CULTURE, BLOOD (ROUTINE X 2)
Culture  Setup Time: 201202241106
Culture: NO GROWTH

## 2010-03-12 LAB — GLUCOSE, CAPILLARY
Glucose-Capillary: 199 mg/dL — ABNORMAL HIGH (ref 70–99)
Glucose-Capillary: 253 mg/dL — ABNORMAL HIGH (ref 70–99)

## 2010-03-13 ENCOUNTER — Inpatient Hospital Stay (HOSPITAL_COMMUNITY): Payer: Medicare Other

## 2010-03-13 DIAGNOSIS — M79609 Pain in unspecified limb: Secondary | ICD-10-CM

## 2010-03-13 LAB — CBC
HCT: 23.7 % — ABNORMAL LOW (ref 36.0–46.0)
Hemoglobin: 7.4 g/dL — ABNORMAL LOW (ref 12.0–15.0)
MCHC: 31.2 g/dL (ref 30.0–36.0)
RDW: 14.7 % (ref 11.5–15.5)
WBC: 16.9 10*3/uL — ABNORMAL HIGH (ref 4.0–10.5)

## 2010-03-13 LAB — CULTURE, ROUTINE-ABSCESS

## 2010-03-13 LAB — GLUCOSE, CAPILLARY
Glucose-Capillary: 163 mg/dL — ABNORMAL HIGH (ref 70–99)
Glucose-Capillary: 188 mg/dL — ABNORMAL HIGH (ref 70–99)
Glucose-Capillary: 133 mg/dL — ABNORMAL HIGH (ref 70–99)
Glucose-Capillary: 180 mg/dL — ABNORMAL HIGH (ref 70–99)

## 2010-03-13 LAB — BASIC METABOLIC PANEL
Calcium: 8.3 mg/dL — ABNORMAL LOW (ref 8.4–10.5)
Chloride: 106 mEq/L (ref 96–112)
Creatinine, Ser: 0.6 mg/dL (ref 0.4–1.2)
GFR calc Af Amer: >60 mL/min (ref >60)
Sodium: 143 mEq/L (ref 135–145)

## 2010-03-13 LAB — ANAEROBIC CULTURE

## 2010-03-13 MED ORDER — IOHEXOL 300 MG/ML  SOLN
100.0000 mL | Freq: Once | INTRAMUSCULAR | Status: AC | PRN
  Administered 2010-03-13: 100 mL via INTRAVENOUS

## 2010-03-13 NOTE — Consult Note (Signed)
  NAMEJAI, BEAR             ACCOUNT NO.:  [id number]  MEDICAL RECORD NO.:  [id number]           PATIENT TYPE:  I  LOCATION:  2923                         FACILITY:  [facility]  PHYSICIAN:  Gabrielle Dare. Janee Morn, M.D.DATE OF BIRTH:  July 17, 1950  DATE OF CONSULTATION: DATE OF DISCHARGE:                                CONSULTATION   CHIEF COMPLAINT:  Liver mass.  HISTORY OF PRESENT ILLNESS:  Ms. Hereford is a 60 year old African American female who complains of right-sided abdominal pain with frequent sweats especially at night.  She was evaluated in the [facility] Emergency Department and workup shows elevated white blood cell count.  CT scan of the abdomen and pelvis demonstrates a 10.5 cm liver mass versus liver abscess.  PAST MEDICAL HISTORY:  Bipolar disorder, hypertension, Parkinson disease, GERD, and diabetes mellitus.  PAST SURGICAL HISTORY:  Tubal ligation and rectal surgery.  SOCIAL HISTORY:  She does not smoke or drink alcohol.  REVIEW OF SYSTEMS:  CONSTITUTIONAL:  She has frequent night sweats.  GI: She has abdominal pain as described above.  RESPIRATORY:  She has pain with deep inspiration under her right rib.  MEDICATIONS:  Lisinopril, benztropine, glyburide, metformin, hydroxyzine, Januvia, pantoprazole, risperidone, and Seroquel.  ALLERGIES:  ASPIRIN.  PHYSICAL EXAMINATION:  VITAL SIGNS:  Temperature 99.7, heart rate 104, blood pressure 109/59, respirations 31, and saturations 96%. HEENT:  Ears are clear.  Face is symmetric and nontender.  Lips are dry. NECK:  Supple with no significant tenderness.  Trachea is in the midline. LUNGS:  Clear to auscultation.  Although, respirations are slightly labored. CARDIAC:  S1 and S2 with palpable impulse on the left chest. ABDOMEN:  Some moderate distention with tenderness in the right upper quadrant.  Her liver edge is palpable.  Bowel sounds are present.  She has a scar under umbilicus likely from her previous tubal  ligation. SKIN:  Warm and dry. NEUROLOGIC:  No gross focal deficit.  I did not appreciate a significant tremor.  LABORATORY STUDIES:  White blood cell count 24.1, lipase 16, INR 1.40, AST 58, ALT 55, alkaline phosphatase 108, bilirubin 0.2.  CT scan of the abdomen and pelvis shows a 10.5 cm liver mass versus liver abscess.  She also has a gallstone visible, but no evidence of acute cholecystitis.  IMPRESSION:  A 10.5 cm liver mass versus abscess with elevated white blood cell count.  PLAN:  I agree with medical admission and IV antibiotics.  The MRI has planned to further characterize this lesion if it is an abscess.  I would recommend percutaneous drain and I discussed this with primary service.  We will follow along with you.     Gabrielle Dare Janee Morn, M.D.     BET/MEDQ  D:  03/06/2010  T:  03/06/2010  Job:  [id number]  cc:   [facility]  Electronically Signed by Violeta Gelinas M.D. on 03/13/2010 10:16:13 AM

## 2010-03-14 LAB — GLUCOSE, CAPILLARY
Glucose-Capillary: 159 mg/dL — ABNORMAL HIGH (ref 70–99)
Glucose-Capillary: 154 mg/dL — ABNORMAL HIGH (ref 70–99)

## 2010-03-15 LAB — CBC
HCT: 22.8 % — ABNORMAL LOW (ref 36.0–46.0)
Hemoglobin: 7.2 g/dL — ABNORMAL LOW (ref 12.0–15.0)
MCH: 25.1 pg — ABNORMAL LOW (ref 26.0–34.0)
MCHC: 31.6 g/dL (ref 30.0–36.0)
MCV: 79.4 fL (ref 78.0–100.0)
Platelets: 438 10*3/uL — ABNORMAL HIGH (ref 150–400)
RBC: 2.87 MIL/uL — ABNORMAL LOW (ref 3.87–5.11)
RDW: 14.8 % (ref 11.5–15.5)
WBC: 14.1 10*3/uL — ABNORMAL HIGH (ref 4.0–10.5)

## 2010-03-15 LAB — BASIC METABOLIC PANEL
BUN: 9 mg/dL (ref 6–23)
CO2: 28 mEq/L (ref 19–32)
Calcium: 8.5 mg/dL (ref 8.4–10.5)
Chloride: 103 mEq/L (ref 96–112)
Creatinine, Ser: 0.83 mg/dL (ref 0.4–1.2)
GFR calc Af Amer: >60 mL/min (ref >60)
GFR calc non Af Amer: >60 mL/min (ref >60)
Glucose, Bld: 176 mg/dL — ABNORMAL HIGH (ref 70–99)
Potassium: 4.6 mEq/L (ref 3.5–5.1)
Sodium: 140 mEq/L (ref 135–145)

## 2010-03-15 LAB — GLUCOSE, CAPILLARY: Glucose-Capillary: 161 mg/dL — ABNORMAL HIGH (ref 70–99)

## 2010-03-16 LAB — GLUCOSE, CAPILLARY: Glucose-Capillary: 174 mg/dL — ABNORMAL HIGH (ref 70–99)

## 2010-03-17 ENCOUNTER — Inpatient Hospital Stay
Admission: AD | Admit: 2010-03-17 | Discharge: 2010-04-15 | Disposition: A | Discharge status: Home / Self Care | Payer: Self-pay | Source: Ambulatory Visit | Attending: Internal Medicine | Admitting: Internal Medicine

## 2010-03-17 ENCOUNTER — Encounter: Payer: Self-pay | Admitting: Internal Medicine

## 2010-03-17 HISTORY — DX: Essential (primary) hypertension: I10

## 2010-03-17 LAB — GLUCOSE, CAPILLARY
Glucose-Capillary: 177 mg/dL — ABNORMAL HIGH (ref 70–99)
Glucose-Capillary: 163 mg/dL — ABNORMAL HIGH (ref 70–99)
Glucose-Capillary: 200 mg/dL — ABNORMAL HIGH (ref 70–99)

## 2010-03-18 DIAGNOSIS — K750 Abscess of liver: Secondary | ICD-10-CM

## 2010-03-18 LAB — CBC
HCT: 25 % — ABNORMAL LOW (ref 36.0–46.0)
Hemoglobin: 7.5 g/dL — ABNORMAL LOW (ref 12.0–15.0)
MCH: 24.2 pg — ABNORMAL LOW (ref 26.0–34.0)
MCHC: 30 g/dL (ref 30.0–36.0)
MCV: 80.6 fL (ref 78.0–100.0)

## 2010-03-18 LAB — VITAMIN B12: Vitamin B-12: 778 pg/mL (ref 211–911)

## 2010-03-18 LAB — IRON AND TIBC
Saturation Ratios: 18 % — ABNORMAL LOW (ref 20–55)
TIBC: 136 ug/dL — ABNORMAL LOW (ref 250–470)
UIBC: 112 ug/dL

## 2010-03-18 LAB — BASIC METABOLIC PANEL
BUN: 4 mg/dL — ABNORMAL LOW (ref 6–23)
CO2: 35 mEq/L — ABNORMAL HIGH (ref 19–32)
Calcium: 8 mg/dL — ABNORMAL LOW (ref 8.4–10.5)
Creatinine, Ser: 0.64 mg/dL (ref 0.4–1.2)
Glucose, Bld: 171 mg/dL — ABNORMAL HIGH (ref 70–99)

## 2010-03-18 LAB — ABO/RH: ABO/RH(D): O POS

## 2010-03-19 LAB — CBC
MCHC: 30.7 g/dL (ref 30.0–36.0)
MCV: 81.9 fL (ref 78.0–100.0)
Platelets: 365 10*3/uL (ref 150–400)
RDW: 15.5 % (ref 11.5–15.5)
WBC: 9.9 10*3/uL (ref 4.0–10.5)

## 2010-03-19 LAB — TYPE AND SCREEN
ABO/RH(D): O POS
Antibody Screen: NEGATIVE

## 2010-03-19 NOTE — Consult Note (Signed)
NAMEORISSA, ARREAGA NO.:  [id number]  MEDICAL RECORD NO.:  [id number]           PATIENT TYPE:  I  LOCATION:  4734                         FACILITY:  [facility]  PHYSICIAN:  Vesta Mixer, M.D. DATE OF BIRTH:  [birth year]  DATE OF CONSULTATION:  03/11/2010 DATE OF DISCHARGE:                                CONSULTATION   PRIMARY CARDIOLOGIST:  The patient is new to [facility].  PRIMARY CARE PROVIDER:  Dr. Haskel Schroeder.  PATIENT PROFILE:  A 60 year old African American female without prior cardiac history, who was admitted March 05, 2010, and diagnosed with a hepatic abscess requiring drainage and also unsteadily noted to have a moderate pericardial effusion whom we have been asked to evaluate.  PROBLEM LIST: 1. Moderate pericardial effusion March 06, 2010, 2-D echocardiogram     EF 60-65% with normal wall motion.  Grade 1 diastolic dysfunction.     Mild without a left atrium.  Moderate posterior pericardial     effusion. 2. A 10.5 cm right hepatic abscess.     a.     Status post percutaneous drain placement, March 08, 2010. 3. Hypertension. 4. Type 2 diabetes mellitus. 5. Hyperlipidemia. 6. Gastroesophageal reflux disease. 7. Obesity. 8. Bipolar disorder. 9. Parkinson. 10.Cholelithiasis noted on abdominal CT this admission. 11.Prerenal azotemia on admission. 12.History of breast lumpectomy.  ALLERGIES:  ASPIRIN causes GI upset.  HISTORY OF PRESENT ILLNESS:  A 60 year old [place] American female with the above problem list.  She has never been diagnosed with coronary artery disease.  However, had a long history of exertional chest tightness and dyspnea that occurs when she goes for her regular walks. Does not appear that have been evaluated.  The patient was seen at [facility] February, 12, 2012, with complaints of chest and upper epigastric pain.  There may have been a reactive airway component with wheezing as well.  She  was diagnosed with bronchitis.  There was no pneumonia on chest x-ray.  The patient was placed on Levaquin and discharged from the ED.  Unfortunately, she never really felt better.  She was having progressive right upper quadrant and diffuse abdominal discomfort along with vomiting and loose stools, was subsequently readmitted on March 05, 2010.  In the ED, a CT of the abdomen was performed showing a 10.5 cm right hepatic abscess versus mass as well as a moderate pericardial effusion.  The patient was placed on IV antibiotics and seen by [facility] with recommendation for MRI to better clarify abscess versus mass.  Followup MRI confirmed abscess on the 25th and Interventional Radiology was consulted for placement of percutaneous drain which was performed March 08, 2010.  A 300 mL of purulent yellow fluid were withdrawn. Since the 26th, the patient has been tachycardic.  She also developed significant respiratory distress following drain placement on the 26th, however, did not require intubation.  She has been having wheezing requiring nebulizers, however.  The patient has been seen by Infectious Disease as she has had persistent leukocytosis and it felt that she likely developed peritonitis in the setting of percutaneous drain placement and probable spillage of abscess  contents.  She had been maintained on Unasyn therapy.  Overall, her abdomen is feeling better, although she is still diffusely tender.  We have been asked to evaluate secondary to ongoing tachycardia and dyspnea in the setting of this known moderate pericardial effusion.  The patient currently denies any chest pain, PND or orthopnea.  She did apparently have a rough night last night with some dyspnea, but this was improved with nebulizers. She does have somewhat labored speech, however, simply with talking.  A repeat 2-D echocardiogram is pending.  CURRENT MEDICATIONS: 1. Unasyn 3 g IV q.6 h. 2.  Cogentin 2 mg daily. 3. Colace 100 mg b.i.d. 4. Ferrous sulfate 325 mg b.i.d. 5. Heparin 5000 units subcu q.8 h. 6. Atarax 50 mg b.i.d. 7. NovoLog sliding scale insulin q.i.d. a.c. and at bed time. 8. Protonix 40 mg daily. 9. Seroquel 50 mg daily. 10.Risperdal 3 mg nightly. 11.Januvia 50 mg daily.  FAMILY HISTORY:  Mother died at 55 with history of diabetes and MI. Father died at 23 with history of diabetes and hepatic cancer.  She has 3 brothers and 4 sisters and at least 2 of her sisters have diabetes.  SOCIAL HISTORY:  The patient lives in [place] with her daughter and granddaughter.  She is on disability related to history of hypertension, depression and Parkinson.  She denies tobacco, alcohol and drug use. She tries to walk on a regular basis for exercise.  REVIEW OF SYSTEMS:  Positive for chest pain or exertional chest tightness and dyspnea as outlined in the HPI and this has been going on for years.  She currently has labored speech.  She has a history of a shuffling gait.  She did have vomiting and diarrhea on admission.  She has ongoing abdominal and back pain.  She is diabetic.  She is a full code.  Otherwise, all systems reviewed and negative.  PHYSICAL EXAMINATION:  VITAL SIGNS:  Temperature 98.0, heart rate 124, respirations 28, blood pressure 115/79.  She is a pulsus paradoxus 14. Weight 91.808 kg, pulse ox 100% on 2 L. GENERAL:  Pleasant African American female, in no acute distress.  Awake and oriented x3.Marland Kitchen  She has a normal affect.  She does have labored speech. HEENT:  Normal nares grossly intact, nonfocal. SKIN:  Warm and dry without lesions or masses. NECK:  Supple and obese, difficult to assess JVP.  No bruits. LUNGS:  Respirations are regular and unlabored with diminished breath sounds at bilateral bases and crackles on the left.  Occasional inspiratory and expiratory wheezing. CARDIAC:  Regular tachycardic S1 and S2.  Somewhat distant heart sounds. No  rub. ABDOMEN:  Obese and firm.  She is diffusely tender more so on the right upper quadrant.  Bowel sounds present x4.  EXTREMITIES:  Warm, dry and pink.  No clubbing, cyanosis or edema.  Dorsalis pedis and posterior tibial pulse are 2+ and equal bilaterally.  Chest x-ray March 11, 2010, shows very low lung volumes with bibasilar opacities and small effusion.  CT angiography of the chest March 08, 2010, shows moderate pericardial effusion and moderate right pleural effusion.  No pulmonary embolus.  EKG shows sinus tachycardia rate of 127, normal axis, no acute ST-T changes.  Hemoglobin 8.2, hematocrit 26.8, WBC 18.9 and platelets 536.  Sodium 141, potassium 3.9, chloride 107, CO2 26, BUN 6, creatinine 0.74, glucose 136, total bilirubin 0.4, alkaline phosphatase 85, AST 22, ALT 19, total protein 5.7, albumin 1.4, amylase 18, lipase 19, CK-MB 1.5, troponin-I less than  0.05, INR 1.4, albumin 1.4, total protein 5.7, lactate 1.0, iron 10, TIBC 143, ferritin 820 folate 9.1, B12 803.  Urine culture shows no growth.  Blood culture showed no growth to date x2. Liver smear shows gram-positive cocci in chains and pairs, although culture shows no growth.  ASSESSMENT: 1. Moderate pericardial effusion.  The patient is having only     tachycardia and dyspnea.  She is a pulse paradoxus 14 mmHg.  I     agree with repeat 2-D echocardiogram as echo from March 06, 2010, has been reviewed by Dr. Elease Hashimoto and shows no evidence of     tamponades.  We planned to review the repeat echo, though at this     time does not appear that she needs to be tapped.  Her ongoing     dyspnea is likely multifactorial.  She has been wheezing a fair     amount and requiring nebulizers.  She also has pleural effusion on     chest x-ray.  History of asthma/COPD may account for her pulsus     paradoxus. 2. Chronic stable exertional angina.  The patient reports a long,     several year history of exertional chest  tightness and dyspnea with     her usual walks.  Risk factors include hypertension,     hyperlipidemia, diabetes and obesity.  Once current issues resolve,     the patient should likely undergo outpatient ischemic evaluation. 3. Hepatic abscess.  Antibiotics per Internal Medicine Infectious     Disease.  Additional workup is still pending.     Nicolasa Ducking, ANP   ______________________________ Vesta Mixer, M.D.    CB/MEDQ  D:  03/11/2010  T:  03/12/2010  Job:  [id number]  Electronically Signed by Nicolasa Ducking ANP on 03/17/2010 04:30:52 PM Electronically Signed by Kristeen Miss M.D. on 03/18/2010 07:03:42 AM

## 2010-03-21 LAB — BASIC METABOLIC PANEL
BUN: 4 mg/dL — ABNORMAL LOW (ref 6–23)
Calcium: 8.4 mg/dL (ref 8.4–10.5)
Creatinine, Ser: 0.7 mg/dL (ref 0.4–1.2)
GFR calc non Af Amer: >60 mL/min (ref >60)
Potassium: 4.5 mEq/L (ref 3.5–5.1)

## 2010-03-22 NOTE — Discharge Summary (Signed)
[name], [name]             ACCOUNT NO.:  [id number]  MEDICAL RECORD NO.:  [id number]           PATIENT TYPE:  I  LOCATION:  4734                         FACILITY:  [facility]  PHYSICIAN:  Lonia Blood, M.D.       DATE OF BIRTH:  1950/04/18  DATE OF ADMISSION:  03/05/2010 DATE OF DISCHARGE:  03/17/2010                        DISCHARGE SUMMARY - REFERRING   PRIMARY CARE PHYSICIAN:  Betti D. Pecola Leisure, MD  DISCHARGE DIAGNOSES: 1. Hepatic abscess, status post CT-guided liver abscess drain     placement on March 08, 2010 by Interventional Radiology. 2. Pericardial effusion felt to be simple and reactive in nature --     the patient will need to follow-up transthoracic echocardiogram on     March 25, 2010. 3. Bipolar disorder. 4. Diabetes mellitus type 2. 5. Hypertension. 6. Gastroesophageal reflux disease. 7. Morbid obesity. 8. Parkinsonism. 9. Cholelithiasis -- the patient needs to follow up [facility]     Surgery for cholecystectomy after the hepatic drain has been     removed.  DISCHARGE MEDICATIONS: 1. Tylenol 650 mg by mouth every 4 hours as needed for pain. 2. Ceftriaxone 1 g intravenously every 24 hours with stop date being     March 26, 2010. 3. Flagyl 500 mg by mouth three times a day with stop date March 26, 2010. 4. Colace 100 mg by mouth twice a day. 5. Ensure 1 can by mouth twice a day. 6. Iron 325 mg by mouth twice a day. 7. Glyburide 5 mg by mouth daily. 8. Heparin 5000 units subcutaneous every 8 hours. 9. Hydroxyzine 25 mg by mouth twice a day. 10.Insulin sliding scale. 11.Metoprolol 12.5 mg by mouth every 8 hours. 12.Multivitamin 1 tablet daily. 13.Zofran 4 mg by mouth every 6 hours for nausea. 14.Oxycodone 5 mg by mouth every 4 hours as needed for pain. 15.Risperdal 2 mg by mouth daily at bedtime. 16.Ambien 5 mg by mouth daily at bedtime as needed for sleep. 17.Cogentin 1 tablet by mouth daily. 18.Januvia 50 mg by mouth daily. 19.Protonix 40  mg daily.  CONDITION ON DISCHARGE:  [name] [name] is getting transferred to a [facility].  CONSULTATION AT ADMISSION:  The patient was seen in consultation by Critical Care Medicine and General Surgery, Infectious Disease, and Interventional Radiology.  The patient was seen in consultation by [facility] Cardiology, Pulmonary at [facility].  PROCEDURE AT THIS ADMISSION: 1. So the patient underwent a MRI of the liver on March 07, 2010     that shows an 11 cm lesion consistent with hepatic abscess. 2. On March 08, 2010, CT angiogram of the chest which shows     positive pericardial effusion, negative pulmonary emboli. 3. On March 08, 2010, CT-guided placement of liver abscess drained. 4. On March 13, 2010, CT scan of abdomen and pelvis with intravenous     contrast that shows decrease in size of the hepatic abscess to 8.5     x 5.4 cm. 5. On March 13, 2010, PICC line by interventional radiology placement. 6. Echocardiogram on March 10, 2010, which shows decrease in the  side of the pericardial effusion which is moderate in nature and     free-flowing without any internal echoes in the fluid and no     suggestion of hemodynamic compromise.  HISTORY AND PHYSICAL:  Refer to dictated H and P done by Dr. Phineas Douglas.  HOSPITAL COURSE:  For the first 5 days of this hospitalization, [name] [name] was cared for in the step-down unit.  She was started on broad- spectrum antibiotics by the admission team and which were continued by infectious consultant.  The patient underwent placement of hepatic drain on March 08, 2010.  After placement of hepatic drain, the patient develops sores-like symptoms.  She was transferred initially to step- down unit and subsequently to the ICU, but she never developed septic shock low full-blown decompensation.  It was felt a lot of her symptoms were due to pain and inability to breathe from her abscess.  We have noticed also the  patient was having pericardial effusion and a consult cardiologist felt this was reactive in nature, this is a hepatic abscess.  As [name] [name] was improving with antibiotics, it was felt there was no need to drain the pericardial effusion.  An MRI of the abdomen revealed presence of cholelithiasis, which is felt to be the source of the hepatic abscess.  Cholecystectomy has been recommended once the hepatic abscess has been appropriate drain, the patient has been treated.  After a prolonged period of incubation, the cultures finally grew microaerophilic streptococci.  Infectious Disease consulted recommend intravenous Rocephin and oral Flagyl for a total of 3 weeks. During this hospitalization, we have adjusted [name] [name]'s psychiatric medications as to provide her with the decreased sedation and more opportunity to participate with physical therapy.  We namely have decrease the dose of hydroxyzine, decrease the dose of Risperdal and discontinue the Seroquel.  Currently, [name] [name] is improving and she is getting transferred to the long-term acute care hospital.     Lonia Blood, M.D.     SL/MEDQ  D:  03/17/2010  T:  03/17/2010  Job:  [id number]  cc:   Jocelyn Lamer D. Pecola Leisure, M.D.  Electronically Signed by Lonia Blood M.D. on 03/22/2010 08:56:12 AM

## 2010-03-23 ENCOUNTER — Encounter: Payer: Self-pay | Admitting: Radiology

## 2010-03-23 ENCOUNTER — Other Ambulatory Visit (HOSPITAL_COMMUNITY): Payer: Medicare Other | Attending: Internal Medicine

## 2010-03-23 MED ORDER — IOHEXOL 300 MG/ML  SOLN
100.0000 mL | Freq: Once | INTRAMUSCULAR | Status: AC | PRN
  Administered 2010-03-23: 100 mL via INTRAVENOUS

## 2010-03-25 DIAGNOSIS — I319 Disease of pericardium, unspecified: Secondary | ICD-10-CM

## 2010-03-25 DIAGNOSIS — K750 Abscess of liver: Secondary | ICD-10-CM

## 2010-03-25 LAB — POTASSIUM: Potassium: 4.2 mEq/L (ref 3.5–5.1)

## 2010-03-25 LAB — BASIC METABOLIC PANEL
Calcium: 9.1 mg/dL (ref 8.4–10.5)
GFR calc Af Amer: >60 mL/min (ref >60)
GFR calc non Af Amer: >60 mL/min (ref >60)
Potassium: 5.1 mEq/L (ref 3.5–5.1)
Sodium: 139 mEq/L (ref 135–145)

## 2010-03-25 LAB — CBC
HCT: 31.9 % — ABNORMAL LOW (ref 36.0–46.0)
MCHC: 30.1 g/dL (ref 30.0–36.0)
Platelets: 381 10*3/uL (ref 150–400)
RDW: 16.3 % — ABNORMAL HIGH (ref 11.5–15.5)
WBC: 11.1 10*3/uL — ABNORMAL HIGH (ref 4.0–10.5)

## 2010-03-28 LAB — COMPREHENSIVE METABOLIC PANEL
ALT: 19 U/L (ref 0–35)
AST: 17 U/L (ref 0–37)
Albumin: 3.3 g/dL — ABNORMAL LOW (ref 3.5–5.2)
Alkaline Phosphatase: 60 U/L (ref 39–117)
Calcium: 8.9 mg/dL (ref 8.4–10.5)
GFR calc Af Amer: >60 mL/min (ref >60)
Potassium: 4 mEq/L (ref 3.5–5.1)
Sodium: 138 mEq/L (ref 135–145)
Total Protein: 6.6 g/dL (ref 6.0–8.3)

## 2010-03-28 LAB — CBC
Hemoglobin: 9.9 g/dL — ABNORMAL LOW (ref 12.0–15.0)
MCH: 25 pg — ABNORMAL LOW (ref 26.0–34.0)
MCHC: 30.9 g/dL (ref 30.0–36.0)
MCHC: 32.9 g/dL (ref 30.0–36.0)
Platelets: 397 10*3/uL (ref 150–400)
Platelets: 258 10*3/uL (ref 150–400)
RBC: 3.96 MIL/uL (ref 3.87–5.11)
RDW: 14.7 % (ref 11.5–15.5)
WBC: 7.5 10*3/uL (ref 4.0–10.5)

## 2010-03-28 LAB — BASIC METABOLIC PANEL
Calcium: 9 mg/dL (ref 8.4–10.5)
GFR calc non Af Amer: >60 mL/min (ref >60)
Potassium: 4 mEq/L (ref 3.5–5.1)
Sodium: 139 mEq/L (ref 135–145)

## 2010-03-28 LAB — DIFFERENTIAL
Eosinophils Absolute: 0.1 10*3/uL (ref 0.0–0.7)
Lymphs Abs: 2.4 10*3/uL (ref 0.7–4.0)
Monocytes Absolute: 0.4 10*3/uL (ref 0.1–1.0)
Monocytes Relative: 6 % (ref 3–12)
Neutrophils Relative %: 62 % (ref 43–77)

## 2010-03-28 LAB — URINALYSIS, ROUTINE W REFLEX MICROSCOPIC
Glucose, UA: NEGATIVE mg/dL
Ketones, ur: 15 mg/dL — AB
Nitrite: NEGATIVE
Protein, ur: NEGATIVE mg/dL
pH: 5.5 (ref 5.0–8.0)

## 2010-03-28 LAB — URINE MICROSCOPIC-ADD ON

## 2010-03-28 LAB — MAGNESIUM: Magnesium: 1.6 mg/dL (ref 1.5–2.5)

## 2010-03-28 LAB — URINE CULTURE: Colony Count: 15000

## 2010-03-28 LAB — GLUCOSE, CAPILLARY: Glucose-Capillary: 156 mg/dL — ABNORMAL HIGH (ref 70–99)

## 2010-03-31 ENCOUNTER — Other Ambulatory Visit (HOSPITAL_COMMUNITY): Payer: Medicare Other | Attending: Internal Medicine

## 2010-03-31 MED ORDER — IOHEXOL 300 MG/ML  SOLN
85.0000 mL | Freq: Once | INTRAMUSCULAR | Status: AC | PRN
  Administered 2010-03-31: 85 mL via INTRAVENOUS

## 2010-03-31 NOTE — H&P (Signed)
[name] [name], [name] [name] NO.:  [id number]  MEDICAL RECORD NO.:  [id number]           PATIENT TYPE:  E  LOCATION:  [facility]                         FACILITY:  [facility]  PHYSICIAN:  Homero Fellers, MD   DATE OF BIRTH:  08-26-1950  DATE OF ADMISSION:  03/05/2010 DATE OF DISCHARGE:                             HISTORY & PHYSICAL   PRIMARY CARE PHYSICIAN:  Betti D. Pecola Leisure, MD  CHIEF COMPLAINT:  Abdominal pain, chest pain, weakness, and poor appetite.  HISTORY OF PRESENT ILLNESS:  This is a 60 year old African American woman who has been feeling sick for about a week and half ago.  She went to [facility] on the February 21, 2010, because of her chest pain and chest discomfort and was apparently diagnosed with pneumonia, was given some Levaquin which she has almost completed.  The patient came back today because she was not feeling better.  She now cannot eat. She could not sleep.  Also, developed diffuse abdominal pain, more localized in the right upper quadrant and epigastric region.  She also had 1 episode of vomiting today and has been having scanty loose stools. The patient denied any definite fever since she has not been taking a temperature.  She has been having some shortness of breath and mild nonproductive cough.  She denied any history of travel out of immediately neighborhood.  She came to the emergency room and had a CT of the abdomen and pelvis done without contrast which showed a 10.5-cm right hepatic mass or abscess, moderate pericardial effusion, and small amount of pelvic ascites.  Chest x-ray also showed worsening bilateral atelectasis or infiltrate.  The patient is somewhat hypoxic and temperature is 98.3.  She, however, has the leukocytosis of 24,000 with a left shift and mild anemia.  She is going to be admitted for further workup and management.  Dr. Violeta Gelinas of General Surgery has been notified, but would prefer to follow the patient  as a consultant.  PAST MEDICAL HISTORY: 1. Diabetes mellitus, type 2. 2. High blood pressure. 3. Bipolar disorder. 4. Gastroesophageal reflux disease. 5. Hyperlipidemia. 6. Parkinson disease. 7. History of breast lumpectomy.  MEDICATION LIST: 1. Lisinopril 20/12.5 one tablet daily. 2. Benztropine 2 mg daily. 3. Glyburide/metformin 5/500 four tablets per day. 4. Hydroxyzine 50 mg 2 tablets per day. 5. Januvia 50 mg daily. 6. Protonix 40 mg daily. 7. Risperidone 3 mg daily. 8. Seroquel 50 mg daily.  ALLERGIES:  ASPIRIN causes gastric irritation.  SOCIAL HISTORY:  No smoking, alcohol, or drugs.  FAMILY HISTORY:  Positive for diabetes in both parents.  No history of myocardial infarction.  REVIEW OF SYSTEMS:  A 10-point review of systems negative except as above.  PHYSICAL EXAMINATION:  VITAL SIGNS:  Blood pressure 101/53, lowest blood pressure in the emergency room was 99/56.  Pulse 107, respirations 28, and temperature is 98.3. GENERAL:  The patient is weak, appeared to be mildly dehydrated.  No obvious respiratory distress. HEENT:  Mouth is dry. NECK:  Supple.  No JVD, adenopathy, or thyromegaly. LUNGS:  Clear anteriorly to auscultation.  There were reduced breath sounds at bases.  S1 and S2.  No  murmurs, rubs, or gallops. ABDOMEN:  Diffusely tender with increasing tenderness in the right upper quadrant and epigastric region.  There is mild rebound tenderness. Bowel sounds present. EXTREMITIES:  No edema, clubbing, or cyanosis. NEUROLOGICAL:  No deficits.  LABORATORY DATA:  Urinalysis suggests urinary tract infection.  White count 24,000 with a left shift.  Hemoglobin 9.2, platelet count 675. Chemistry:  Sodium is 137, potassium 4.1, BUN 31, and creatinine 1.6. Liver enzymes modestly elevated at 58 AST, 55 ALT, bilirubin is 0.2. Amylase is normal.  Chest x-ray showed progression of bibasilar airspace disease.  Abdominal x-ray showed normal bowel gas pattern.  CT of  the abdomen as described above.  ASSESSMENT:  This is a 60 year old woman admitted with: 1. A 10-cm hepatic mass which is likely an abscess. 2. Leukocytosis with left shift and possible early sepsis. 3. Bilateral pneumonia. 4. Moderate pericardial effusion.  Further evaluation is needed.  I do     not think she needs any drainage of pericardial fluid at this time,     but cardiology evaluation will be reasonable. 5. Urinary tract infection. 6. Mild anemia. 7. Diabetes mellitus with fair control. 8. Mild dehydration. 9. Elevated liver enzymes.  PLAN:  Admit to Step-Down, start broad-spectrum antibiotics including ceftazidime, Flagyl, and Levaquin.  This should cover Pseudomonas anaerobes or other gram negatives which are most likely indicated.  I would suggest ID consultation for optimization of antibiotics coverage. A 2-D echo has been ordered to further evaluate pericardial effusion.  I will also recommend cardiology evaluation.  Dr. Violeta Gelinas of General Surgery will see the patient tonight.  I have ordered MRI of the liver with contrast to further evaluate the liver mass or abscess.  She will probably benefit from CT drainage of the abscess if abscess is confirmed.  Creatinine is 1.6.  She was given some fluids.  We will repeat BMP to ensure that creatinine is adequate or appropriate for contrast.  The patient was given IV fluids.  I will use SCDs for DVT prophylaxis in the view of planned liver abscess drainage. Her condition overall is guarded.  70 minutes spent.     Homero Fellers, MD     FA/MEDQ  D:  03/05/2010  T:  03/05/2010  Job:  [id number]  Electronically Signed by Homero Fellers  on 03/31/2010 02:09:37 AM

## 2010-04-01 DIAGNOSIS — K750 Abscess of liver: Secondary | ICD-10-CM

## 2010-04-01 LAB — CBC
HCT: 31.6 % — ABNORMAL LOW (ref 36.0–46.0)
MCH: 25.3 pg — ABNORMAL LOW (ref 26.0–34.0)
MCV: 80.8 fL (ref 78.0–100.0)
RDW: 16.5 % — ABNORMAL HIGH (ref 11.5–15.5)
WBC: 9.3 10*3/uL (ref 4.0–10.5)

## 2010-04-01 LAB — BASIC METABOLIC PANEL
BUN: 6 mg/dL (ref 6–23)
Chloride: 98 mEq/L (ref 96–112)
Creatinine, Ser: 0.65 mg/dL (ref 0.4–1.2)
GFR calc non Af Amer: >60 mL/min (ref >60)
Glucose, Bld: 188 mg/dL — ABNORMAL HIGH (ref 70–99)
Potassium: 4.4 mEq/L (ref 3.5–5.1)

## 2010-04-03 LAB — BASIC METABOLIC PANEL
BUN: 5 mg/dL — ABNORMAL LOW (ref 6–23)
Calcium: 8.8 mg/dL (ref 8.4–10.5)
GFR calc non Af Amer: >60 mL/min (ref >60)
Glucose, Bld: 150 mg/dL — ABNORMAL HIGH (ref 70–99)

## 2010-04-03 LAB — CBC
HCT: 31.6 % — ABNORMAL LOW (ref 36.0–46.0)
MCHC: 31 g/dL (ref 30.0–36.0)
MCV: 81.4 fL (ref 78.0–100.0)
RDW: 16.5 % — ABNORMAL HIGH (ref 11.5–15.5)

## 2010-04-05 ENCOUNTER — Other Ambulatory Visit (HOSPITAL_COMMUNITY): Payer: Medicare Other | Attending: Internal Medicine

## 2010-04-06 ENCOUNTER — Other Ambulatory Visit (HOSPITAL_COMMUNITY): Payer: Medicare Other | Attending: Internal Medicine

## 2010-04-06 ENCOUNTER — Other Ambulatory Visit: Payer: Self-pay | Admitting: Diagnostic Radiology

## 2010-04-06 ENCOUNTER — Other Ambulatory Visit (HOSPITAL_COMMUNITY): Payer: Medicare Other | Attending: Pulmonary Disease

## 2010-04-06 ENCOUNTER — Other Ambulatory Visit (HOSPITAL_COMMUNITY): Payer: Medicare Other

## 2010-04-06 LAB — BODY FLUID CELL COUNT WITH DIFFERENTIAL
Eos, Fluid: 0 %
Lymphs, Fluid: 24 %

## 2010-04-06 LAB — FUNGUS CULTURE W SMEAR: Fungal Smear: NONE SEEN

## 2010-04-06 LAB — LACTATE DEHYDROGENASE, PLEURAL OR PERITONEAL FLUID: LD, Fluid: 157 U/L — ABNORMAL HIGH (ref 3–23)

## 2010-04-06 LAB — GLUCOSE, SEROUS FLUID: Glucose, Fluid: 249 mg/dL

## 2010-04-07 DIAGNOSIS — J90 Pleural effusion, not elsewhere classified: Secondary | ICD-10-CM

## 2010-04-07 DIAGNOSIS — K750 Abscess of liver: Secondary | ICD-10-CM

## 2010-04-07 LAB — CBC
Hemoglobin: 10.3 g/dL — ABNORMAL LOW (ref 12.0–15.0)
MCH: 25.3 pg — ABNORMAL LOW (ref 26.0–34.0)
MCHC: 31.8 g/dL (ref 30.0–36.0)
MCV: 79.6 fL (ref 78.0–100.0)

## 2010-04-07 LAB — BASIC METABOLIC PANEL
BUN: 6 mg/dL (ref 6–23)
CO2: 32 mEq/L (ref 19–32)
Calcium: 8.7 mg/dL (ref 8.4–10.5)
Chloride: 96 mEq/L (ref 96–112)
Creatinine, Ser: 0.59 mg/dL (ref 0.4–1.2)
GFR calc Af Amer: >60 mL/min (ref >60)

## 2010-04-07 LAB — LACTATE DEHYDROGENASE: LDH: 107 U/L (ref 94–250)

## 2010-04-09 ENCOUNTER — Other Ambulatory Visit (HOSPITAL_COMMUNITY): Payer: Medicare Other | Attending: Internal Medicine

## 2010-04-09 LAB — BODY FLUID CULTURE: Culture: NO GROWTH

## 2010-04-10 ENCOUNTER — Other Ambulatory Visit (HOSPITAL_COMMUNITY): Payer: Medicare Other

## 2010-04-10 ENCOUNTER — Other Ambulatory Visit: Payer: Self-pay | Admitting: Pulmonary Disease

## 2010-04-10 ENCOUNTER — Other Ambulatory Visit (HOSPITAL_COMMUNITY): Payer: Medicare Other | Attending: Internal Medicine

## 2010-04-10 DIAGNOSIS — K750 Abscess of liver: Secondary | ICD-10-CM

## 2010-04-10 DIAGNOSIS — J90 Pleural effusion, not elsewhere classified: Secondary | ICD-10-CM

## 2010-04-10 LAB — BODY FLUID CELL COUNT WITH DIFFERENTIAL
Eos, Fluid: 0 %
Neutrophil Count, Fluid: 34 % — ABNORMAL HIGH (ref 0–25)
Other Cells, Fluid: 0 %
Total Nucleated Cell Count, Fluid: 3659 cu mm — ABNORMAL HIGH (ref 0–1000)

## 2010-04-10 LAB — BASIC METABOLIC PANEL
BUN: 4 mg/dL — ABNORMAL LOW (ref 6–23)
Calcium: 9.1 mg/dL (ref 8.4–10.5)
Creatinine, Ser: 0.66 mg/dL (ref 0.4–1.2)
GFR calc non Af Amer: >60 mL/min (ref >60)
Glucose, Bld: 112 mg/dL — ABNORMAL HIGH (ref 70–99)
Potassium: 4.2 mEq/L (ref 3.5–5.1)

## 2010-04-10 LAB — LACTATE DEHYDROGENASE, PLEURAL OR PERITONEAL FLUID: LD, Fluid: 135 U/L — ABNORMAL HIGH (ref 3–23)

## 2010-04-10 LAB — PROTEIN, BODY FLUID: Total protein, fluid: 5.8 g/dL

## 2010-04-10 LAB — DIFFERENTIAL
Eosinophils Absolute: 0.4 10*3/uL (ref 0.0–0.7)
Eosinophils Relative: 4 % (ref 0–5)
Lymphocytes Relative: 23 % (ref 12–46)
Lymphs Abs: 2.7 10*3/uL (ref 0.7–4.0)
Monocytes Absolute: 1.5 10*3/uL — ABNORMAL HIGH (ref 0.1–1.0)
Monocytes Relative: 13 % — ABNORMAL HIGH (ref 3–12)

## 2010-04-10 LAB — CBC
HCT: 32.5 % — ABNORMAL LOW (ref 36.0–46.0)
MCH: 25.6 pg — ABNORMAL LOW (ref 26.0–34.0)
MCHC: 32.3 g/dL (ref 30.0–36.0)
MCV: 79.3 fL (ref 78.0–100.0)
Platelets: 394 10*3/uL (ref 150–400)
RDW: 16.2 % — ABNORMAL HIGH (ref 11.5–15.5)
WBC: 11.4 10*3/uL — ABNORMAL HIGH (ref 4.0–10.5)

## 2010-04-10 LAB — PH, BODY FLUID

## 2010-04-10 LAB — GLUCOSE, SEROUS FLUID

## 2010-04-11 ENCOUNTER — Other Ambulatory Visit (HOSPITAL_COMMUNITY): Payer: Medicare Other | Attending: Internal Medicine

## 2010-04-12 LAB — BASIC METABOLIC PANEL
BUN: 6 mg/dL (ref 6–23)
CO2: 30 mEq/L (ref 19–32)
Chloride: 98 mEq/L (ref 96–112)
Creatinine, Ser: 0.64 mg/dL (ref 0.4–1.2)
Glucose, Bld: 128 mg/dL — ABNORMAL HIGH (ref 70–99)
Potassium: 4.1 mEq/L (ref 3.5–5.1)

## 2010-04-12 LAB — CBC
HCT: 33.3 % — ABNORMAL LOW (ref 36.0–46.0)
MCH: 24.6 pg — ABNORMAL LOW (ref 26.0–34.0)
MCHC: 31.5 g/dL (ref 30.0–36.0)
MCV: 78 fL (ref 78.0–100.0)
Platelets: 390 10*3/uL (ref 150–400)
RDW: 16.2 % — ABNORMAL HIGH (ref 11.5–15.5)

## 2010-04-12 LAB — RHEUMATOID FACTOR: Rhuematoid fact SerPl-aCnc: <10 IU/mL (ref <=14)

## 2010-04-13 ENCOUNTER — Other Ambulatory Visit (HOSPITAL_COMMUNITY): Payer: Medicare Other

## 2010-04-13 ENCOUNTER — Other Ambulatory Visit (HOSPITAL_COMMUNITY): Payer: Medicare Other | Attending: Internal Medicine

## 2010-04-13 DIAGNOSIS — K750 Abscess of liver: Secondary | ICD-10-CM

## 2010-04-13 DIAGNOSIS — J90 Pleural effusion, not elsewhere classified: Secondary | ICD-10-CM

## 2010-04-13 LAB — BASIC METABOLIC PANEL
CO2: 30 mEq/L (ref 19–32)
Chloride: 98 mEq/L (ref 96–112)
Creatinine, Ser: 0.61 mg/dL (ref 0.4–1.2)
GFR calc Af Amer: >60 mL/min (ref >60)
Glucose, Bld: 93 mg/dL (ref 70–99)

## 2010-04-13 LAB — CBC
HCT: 30.5 % — ABNORMAL LOW (ref 36.0–46.0)
Hemoglobin: 9.7 g/dL — ABNORMAL LOW (ref 12.0–15.0)
MCH: 25.3 pg — ABNORMAL LOW (ref 26.0–34.0)
MCV: 79.4 fL (ref 78.0–100.0)
RBC: 3.84 MIL/uL — ABNORMAL LOW (ref 3.87–5.11)

## 2010-04-13 LAB — ANTI-NUCLEAR AB-TITER (ANA TITER): ANA Titer 1: 1:40 {titer} — ABNORMAL HIGH

## 2010-04-14 ENCOUNTER — Other Ambulatory Visit (HOSPITAL_COMMUNITY): Payer: Medicare Other | Attending: Internal Medicine

## 2010-04-14 LAB — BODY FLUID CULTURE

## 2010-04-14 LAB — MPO/PR-3 (ANCA) ANTIBODIES

## 2010-04-14 LAB — ANTI-NEUTROPHIL ANTIBODY

## 2010-04-14 LAB — C3 COMPLEMENT: C3 Complement: 102 mg/dL (ref 88–201)

## 2010-04-15 LAB — CBC
HCT: 30.5 % — ABNORMAL LOW (ref 36.0–46.0)
MCV: 77.8 fL — ABNORMAL LOW (ref 78.0–100.0)
RDW: 16.2 % — ABNORMAL HIGH (ref 11.5–15.5)
WBC: 10.4 10*3/uL (ref 4.0–10.5)

## 2010-04-15 LAB — COMPREHENSIVE METABOLIC PANEL
Alkaline Phosphatase: 40 U/L (ref 39–117)
BUN: 5 mg/dL — ABNORMAL LOW (ref 6–23)
Chloride: 100 mEq/L (ref 96–112)
GFR calc non Af Amer: >60 mL/min (ref >60)
Glucose, Bld: 93 mg/dL (ref 70–99)
Potassium: 4.1 mEq/L (ref 3.5–5.1)
Total Bilirubin: 0.2 mg/dL — ABNORMAL LOW (ref 0.3–1.2)
Total Protein: 7.6 g/dL (ref 6.0–8.3)

## 2010-04-15 LAB — DIFFERENTIAL
Basophils Absolute: 0.1 10*3/uL (ref 0.0–0.1)
Lymphs Abs: 3.3 10*3/uL (ref 0.7–4.0)
Monocytes Relative: 11 % (ref 3–12)
Neutro Abs: 5.5 10*3/uL (ref 1.7–7.7)

## 2010-04-29 ENCOUNTER — Encounter: Admit: 2010-04-29 | Payer: Self-pay | Admitting: Family Medicine

## 2010-05-06 ENCOUNTER — Encounter: Payer: Self-pay | Admitting: Cardiovascular Disease

## 2010-05-07 ENCOUNTER — Encounter: Payer: Medicare Other | Admitting: Cardiovascular Disease

## 2010-05-07 ENCOUNTER — Other Ambulatory Visit: Payer: Self-pay | Admitting: Family Medicine

## 2010-05-07 ENCOUNTER — Ambulatory Visit
Admission: RE | Admit: 2010-05-07 | Discharge: 2010-05-07 | Disposition: A | Discharge status: Home / Self Care | Payer: Medicare Other | Source: Ambulatory Visit | Attending: Family Medicine | Admitting: Family Medicine

## 2010-05-07 DIAGNOSIS — K7689 Other specified diseases of liver: Secondary | ICD-10-CM

## 2010-05-09 ENCOUNTER — Emergency Department (HOSPITAL_COMMUNITY)
Admission: EM | Admit: 2010-05-09 | Discharge: 2010-05-10 | Disposition: A | Discharge status: Home / Self Care | Payer: Medicare Other | Attending: Emergency Medicine | Admitting: Emergency Medicine

## 2010-05-09 ENCOUNTER — Emergency Department (HOSPITAL_COMMUNITY): Payer: Medicare Other

## 2010-05-09 DIAGNOSIS — M545 Low back pain, unspecified: Secondary | ICD-10-CM | POA: Insufficient documentation

## 2010-05-09 DIAGNOSIS — F319 Bipolar disorder, unspecified: Secondary | ICD-10-CM | POA: Insufficient documentation

## 2010-05-09 DIAGNOSIS — E785 Hyperlipidemia, unspecified: Secondary | ICD-10-CM | POA: Insufficient documentation

## 2010-05-09 DIAGNOSIS — G20 Parkinson's disease: Secondary | ICD-10-CM | POA: Insufficient documentation

## 2010-05-09 DIAGNOSIS — K219 Gastro-esophageal reflux disease without esophagitis: Secondary | ICD-10-CM | POA: Insufficient documentation

## 2010-05-09 DIAGNOSIS — R5381 Other malaise: Secondary | ICD-10-CM | POA: Insufficient documentation

## 2010-05-09 DIAGNOSIS — G20A1 Parkinson's disease without dyskinesia, without mention of fluctuations: Secondary | ICD-10-CM | POA: Insufficient documentation

## 2010-05-09 DIAGNOSIS — R42 Dizziness and giddiness: Secondary | ICD-10-CM | POA: Insufficient documentation

## 2010-05-09 DIAGNOSIS — E119 Type 2 diabetes mellitus without complications: Secondary | ICD-10-CM | POA: Insufficient documentation

## 2010-05-09 DIAGNOSIS — I10 Essential (primary) hypertension: Secondary | ICD-10-CM | POA: Insufficient documentation

## 2010-05-09 DIAGNOSIS — R109 Unspecified abdominal pain: Secondary | ICD-10-CM | POA: Insufficient documentation

## 2010-05-09 LAB — COMPREHENSIVE METABOLIC PANEL
Albumin: 3 g/dL — ABNORMAL LOW (ref 3.5–5.2)
BUN: 8 mg/dL (ref 6–23)
Creatinine, Ser: 0.59 mg/dL (ref 0.4–1.2)
Glucose, Bld: 110 mg/dL — ABNORMAL HIGH (ref 70–99)
Total Bilirubin: 0.9 mg/dL (ref 0.3–1.2)
Total Protein: 7.6 g/dL (ref 6.0–8.3)

## 2010-05-09 LAB — POCT CARDIAC MARKERS
CKMB, poc: <1 ng/mL — ABNORMAL LOW (ref 1.0–8.0)
Troponin i, poc: <0.05 ng/mL (ref 0.00–0.09)

## 2010-05-09 LAB — URINALYSIS, ROUTINE W REFLEX MICROSCOPIC
Leukocytes, UA: NEGATIVE
Specific Gravity, Urine: 1.02 (ref 1.005–1.030)
Urobilinogen, UA: 0.2 mg/dL (ref 0.0–1.0)

## 2010-05-09 LAB — URINE MICROSCOPIC-ADD ON

## 2010-05-10 LAB — DIFFERENTIAL
Basophils Absolute: 0.1 K/uL (ref 0.0–0.1)
Basophils Relative: 1 % (ref 0–1)
Eosinophils Absolute: 0.3 K/uL (ref 0.0–0.7)
Eosinophils Relative: 3 % (ref 0–5)
Lymphocytes Relative: 40 % (ref 12–46)
Lymphs Abs: 3.3 K/uL (ref 0.7–4.0)
Monocytes Absolute: 0.7 K/uL (ref 0.1–1.0)
Monocytes Relative: 9 % (ref 3–12)
Neutro Abs: 3.9 K/uL (ref 1.7–7.7)
Neutrophils Relative %: 47 % (ref 43–77)

## 2010-05-10 LAB — CBC
Platelets: ADEQUATE 10*3/uL (ref 150–400)
RBC: 4.76 MIL/uL (ref 3.87–5.11)
WBC: 8.3 10*3/uL (ref 4.0–10.5)

## 2010-05-12 ENCOUNTER — Encounter: Payer: Self-pay | Admitting: Cardiovascular Disease

## 2010-05-12 ENCOUNTER — Ambulatory Visit (INDEPENDENT_AMBULATORY_CARE_PROVIDER_SITE_OTHER): Payer: Medicare Other | Admitting: Cardiovascular Disease

## 2010-05-12 VITALS — BP 105/73 | HR 89 | Resp 16 | Ht 63.0 in | Wt 171.8 lb

## 2010-05-12 DIAGNOSIS — I313 Pericardial effusion (noninflammatory): Secondary | ICD-10-CM | POA: Insufficient documentation

## 2010-05-12 DIAGNOSIS — E782 Mixed hyperlipidemia: Secondary | ICD-10-CM | POA: Insufficient documentation

## 2010-05-12 DIAGNOSIS — E1169 Type 2 diabetes mellitus with other specified complication: Secondary | ICD-10-CM | POA: Insufficient documentation

## 2010-05-12 DIAGNOSIS — I3139 Other pericardial effusion (noninflammatory): Secondary | ICD-10-CM

## 2010-05-12 DIAGNOSIS — I10 Essential (primary) hypertension: Secondary | ICD-10-CM | POA: Insufficient documentation

## 2010-05-12 DIAGNOSIS — I319 Disease of pericardium, unspecified: Secondary | ICD-10-CM

## 2010-05-12 HISTORY — DX: Other pericardial effusion (noninflammatory): I31.39

## 2010-05-12 HISTORY — DX: Pericardial effusion (noninflammatory): I31.3

## 2010-05-12 NOTE — Patient Instructions (Signed)
Your physician recommends that you schedule a follow-up appointment in: 3 MONTHS WITH DR Elease Hashimoto WITH ECHO SAME DAY  Your physician has requested that you have an echocardiogram. Echocardiography is a painless test that uses sound waves to create images of your heart. It provides your doctor with information about the size and shape of your heart and how well your heart's chambers and valves are working. This procedure takes approximately one hour. There are no restrictions for this procedure. IN 3 MONTHS DX PERICARDIAL EFFUSION SEE DR Elease Hashimoto  SAME DAY

## 2010-05-12 NOTE — Progress Notes (Signed)
60 yo patient of Dr Delane Ginger mistakenly put on my schedule post hospital F/U  Reviewed recent consult note from hospitalization for liver abscess and drainage. Had a moderate pericardial effusion with no tamponade.  Las F/U echo 3/12 with no change.  Patient still had some pain in RUQ. No cardiac symptoms.  No dyspnea, SSCP, palpitations, syncope or edema.  Compliant with meds.  CRF;s HTN and DM well treated.    Reviewed echos from 2/28 and 3/14  Moderate pericardial effusion with no tamponade  Note connective tissue labs negative in hospital but ESR was 105.  Did not see TSH  ROS: Denies fever, malais, weight loss, blurry vision, decreased visual acuity, cough, sputum, SOB, hemoptysis, pleuritic pain, palpitaitons, heartburn, abdominal pain, melena, lower extremity edema, claudication, or rash.   General: Affect appropriate Healthy:  appears stated age HEENT: normal Neck supple with no adenopathy JVP normal no bruits no thyromegaly Lungs clear with no wheezing and good diaphragmatic motion Heart:  S1/S2 no murmur,rub, gallop or click PMI normal Abdomen: benighn, BS positve, no tenderness, no AAA no bruit.  No HSM or HJR Distal pulses intact with no bruits No edema Neuro non-focal Skin warm and dry No muscular weakness   Current Outpatient Prescriptions  Medication Sig Dispense Refill  . amLODipine (NORVASC) 5 MG tablet Take 5 mg by mouth daily.        . benztropine (COGENTIN) 2 MG tablet Take 1 mg by mouth 2 (two) times daily.       . ferrous gluconate (FERGON) 325 MG tablet Take 325 mg by mouth 2 (two) times daily.        . furosemide (LASIX) 40 MG tablet Take 40 mg by mouth daily.        Marland Kitchen glipiZIDE-metformin (METAGLIP) 5-500 MG per tablet Take 2 tablets by mouth 2 (two) times daily before a meal.       . hydrOXYzine (ATARAX) 25 MG tablet Take 25 mg by mouth 2 (two) times daily.        . metoprolol (LOPRESSOR) 50 MG tablet Take 50 mg by mouth 2 (two) times daily.        Marland Kitchen  Nystatin (NYSTOP EX) Apply topically as directed.        Marland Kitchen oxyCODONE (OXYCONTIN) 10 MG 12 hr tablet Take 10 mg by mouth every 8 (eight) hours as needed.        . pantoprazole (PROTONIX) 40 MG tablet Take 40 mg by mouth daily.        . QUEtiapine (SEROQUEL) 50 MG tablet Take 50 mg by mouth at bedtime.        Marland Kitchen zolpidem (AMBIEN) 10 MG tablet Take 10 mg by mouth at bedtime as needed.        Marland Kitchen DISCONTD: divalproex (DEPAKOTE) 500 MG 24 hr tablet Take 500 mg by mouth daily.        Marland Kitchen DISCONTD: glyBURIDE (DIABETA) 5 MG tablet Take 5 mg by mouth daily with breakfast.        . DISCONTD: lisinopril-hydrochlorothiazide (PRINZIDE,ZESTORETIC) 20-25 MG per tablet Take 1 tablet by mouth daily.        Marland Kitchen DISCONTD: metoprolol (TOPROL-XL) 100 MG 24 hr tablet Take 100 mg by mouth 2 (two) times daily.       Marland Kitchen DISCONTD: pioglitazone (ACTOS) 30 MG tablet Take 30 mg by mouth daily.        Marland Kitchen DISCONTD: RABEprazole (ACIPHEX) 20 MG tablet Take 20 mg by mouth daily.        Marland Kitchen  DISCONTD: risperiDONE (RISPERDAL) 3 MG tablet Take 3 mg by mouth 2 (two) times daily.        Marland Kitchen DISCONTD: simvastatin (ZOCOR) 20 MG tablet Take 20 mg by mouth at bedtime.          Allergies  Review of patient's allergies indicates no known allergies.  Electrocardiogram:  Assessment and Plan

## 2010-05-12 NOTE — Assessment & Plan Note (Signed)
Well controlled.  Continue current medications and low sodium Dash type diet.    

## 2010-05-12 NOTE — Assessment & Plan Note (Signed)
Cholesterol is at goal.  Continue current dose of statin and diet Rx.  No myalgias or side effects.  F/U  LFT's in 6 months. No results found for this basename: LDLCALC             

## 2010-05-12 NOTE — Assessment & Plan Note (Signed)
F/U echo in 3 months with Dr Elease Hashimoto.  Primary should further w/u ESR and follow.  Usually means inflamatory disease or cancer.  Currently not on steroids.

## 2010-05-24 LAB — AFB CULTURE WITH SMEAR (NOT AT ARMC)

## 2010-05-26 NOTE — Assessment & Plan Note (Signed)
 HEALTHCARE                         GASTROENTEROLOGY OFFICE NOTE   ZENDA, HERSKOWITZ                    MRN:          [id number]  DATE:05/23/2006                            DOB:          1950-11-28    HISTORY OF PRESENT ILLNESS:  Ms. Dusenbury is Dr. Lovell Sheehan' patient.  She is  a 60 year old African American female with mild distal esophageal  stricture which was seen on upper endoscopy on March 16, 2004, after  presenting with solid food dysphagia.  She was dilated with several  dilators, 14, 15 and 16 mm with complete resolution of her dysphagia.  She no longer has regurgitation of the food.  She is now on Prevacid 30  mg daily, but would be interested in a cheaper medications.   PHYSICAL EXAMINATION:  VITAL SIGNS:  Blood pressure 120/64, pulse 74,  weight 219 pounds.  The patient was not reexamined today.   IMPRESSION:  A 60 year old African American female with chronic  gastroesophageal reflux disease and benign distal esophageal strictures  status post dilation to 48-French with complete resolution of her  symptoms.   PLAN:  1. Continue anti-reflux measures.  2. Her insurance is [employer].  We will electronically transmit Aciphex      20 mg daily which is covered by [facility].  The patient was encouraged      to take one daily.  3. Continue to loose weight.  4. Full anti-reflux measures.  5. We will see her in about a year.     Hedwig Morton. Juanda Chance, MD  Electronically Signed    DMB/MedQ  DD: 05/23/2006  DT: 05/23/2006  Job #: [id number]   cc:   Della Goo, M.D.

## 2010-06-05 ENCOUNTER — Other Ambulatory Visit: Payer: Self-pay | Admitting: Gastroenterology

## 2010-06-15 ENCOUNTER — Encounter (INDEPENDENT_AMBULATORY_CARE_PROVIDER_SITE_OTHER): Payer: Self-pay | Admitting: General Surgery

## 2010-07-22 ENCOUNTER — Encounter (HOSPITAL_COMMUNITY)
Admission: RE | Admit: 2010-07-22 | Discharge: 2010-07-22 | Disposition: A | Discharge status: Home / Self Care | Payer: Medicare Other | Source: Ambulatory Visit | Attending: General Surgery | Admitting: General Surgery

## 2010-07-22 LAB — SURGICAL PCR SCREEN
MRSA, PCR: NEGATIVE
Staphylococcus aureus: POSITIVE — AB

## 2010-07-22 LAB — COMPREHENSIVE METABOLIC PANEL
AST: 14 U/L (ref 0–37)
Albumin: 3.5 g/dL (ref 3.5–5.2)
Alkaline Phosphatase: 82 U/L (ref 39–117)
BUN: 12 mg/dL (ref 6–23)
Creatinine, Ser: 0.6 mg/dL (ref 0.50–1.10)
Potassium: 4.3 mEq/L (ref 3.5–5.1)
Total Protein: 7.8 g/dL (ref 6.0–8.3)

## 2010-07-22 LAB — CBC
HCT: 41.8 % (ref 36.0–46.0)
MCHC: 32.5 g/dL (ref 30.0–36.0)
Platelets: 265 10*3/uL (ref 150–400)
RDW: 15.1 % (ref 11.5–15.5)
WBC: 6.7 10*3/uL (ref 4.0–10.5)

## 2010-07-24 ENCOUNTER — Ambulatory Visit (HOSPITAL_COMMUNITY)
Admission: RE | Admit: 2010-07-24 | Discharge: 2010-07-24 | Disposition: A | Discharge status: Home / Self Care | Payer: Medicare Other | Source: Ambulatory Visit | Attending: General Surgery | Admitting: General Surgery

## 2010-07-24 ENCOUNTER — Other Ambulatory Visit (INDEPENDENT_AMBULATORY_CARE_PROVIDER_SITE_OTHER): Payer: Self-pay | Admitting: General Surgery

## 2010-07-24 ENCOUNTER — Ambulatory Visit (HOSPITAL_COMMUNITY): Payer: Medicare Other

## 2010-07-24 DIAGNOSIS — K8020 Calculus of gallbladder without cholecystitis without obstruction: Secondary | ICD-10-CM | POA: Insufficient documentation

## 2010-07-24 DIAGNOSIS — Z79899 Other long term (current) drug therapy: Secondary | ICD-10-CM | POA: Insufficient documentation

## 2010-07-24 DIAGNOSIS — Z01812 Encounter for preprocedural laboratory examination: Secondary | ICD-10-CM | POA: Insufficient documentation

## 2010-07-24 DIAGNOSIS — K8010 Calculus of gallbladder with chronic cholecystitis without obstruction: Secondary | ICD-10-CM

## 2010-07-24 HISTORY — PX: CHOLECYSTECTOMY, LAPAROSCOPIC: SHX56

## 2010-07-24 LAB — GLUCOSE, CAPILLARY: Glucose-Capillary: 171 mg/dL — ABNORMAL HIGH (ref 70–99)

## 2010-07-25 NOTE — Op Note (Signed)
[name] [name], FENDERSON NO.:  [id number]  MEDICAL RECORD NO.:  [id number]  LOCATION:  [facility]                         FACILITY:  [facility]  PHYSICIAN:  Gabrielle Dare. Janee Morn, M.D.DATE OF BIRTH:  12-22-1950  DATE OF PROCEDURE:  07/24/2010 DATE OF DISCHARGE:                              OPERATIVE REPORT   PREOPERATIVE DIAGNOSES: 1. Cholelithiasis. 2. History of liver abscess.  POSTOPERATIVE DIAGNOSES: 1. Cholelithiasis. 2. History of liver abscess.  PROCEDURE:  Laparoscopic cholecystectomy with intraoperative cholangiogram.  SURGEON:  Gabrielle Dare. Janee Morn, MD  ANESTHESIA:  General endotracheal.  HISTORY OF PRESENT ILLNESS:  [name] is a 60 year old African American female, who was admitted to the hospital with a liver abscess. This was treated with percutaneous drainage and IV antibiotics.  She was noted to have gallstones at that time, but no evidence of acute cholecystitis.  After she recovered from her liver abscess, she was evaluated by Dr. Bosie Clos from GI for colonoscopy to make sure there were no sources of the abscess seen in her colon now, that showed a small polyp, but no other explanation.  She presents for cholecystectomy as this seemed the most likely source for her liver abscess.  PROCEDURE IN DETAIL:  Informed consent was obtained.  The patient was identified in the preop holding area.  She received intravenous antibiotics.  She was brought to the operating room.  General endotracheal anesthesia was administered by the anesthesia staff.  Her abdomen was prepped and draped in sterile fashion.  We did time-out procedure.  Next, 0.25% Marcaine with epinephrine was injected in the supraumbilical region to her previous periumbilical surgery. Supraumbilical incision was made.  Subcutaneous tissues were dissected down revealing the anterior fascia.  This was divided sharply along the midline and peritoneal cavity was entered under direct vision.  A  0 Vicryl pursestring suture was placed around the fascial opening.  The [facility] trocar was inserted into the abdomen.  The abdomen was insufflated with carbon dioxide in standard fashion.  Under direct vision, a 5-mm epigastric and two 5-mm right lateral ports were placed. Marcaine 0.25% with epinephrine was used at all port site.  The abdomen was then explored laparoscopically.  There were some adhesions in the left upper quadrant.  No significant abnormalities were seen grossly in the lower quadrant.  The right upper quadrant demonstrated the gallbladder had chronic adhesions of the omentum up to the gallbladder and the liver.  The dome of the gallbladder was retracted superomedially and these adhesions were gently and gradually taken down.  Some were adherent to the body of the gallbladder and some were adherent to the liver.  These were cleared off the liver and cautery was used to get good hemostasis.  The mostly filmy adhesions were stripped down off the gallbladder revealing the infundibulum.  This was then retracted inferolaterally.  The dissection continued first revealing the cystic artery.  This kind of overlaid the cystic duct and was seen to clearly run up along the body of the gallbladder.  This was dissected out and clipped twice proximally and once distally and divided.  The cystic duct was then dissected, beginning laterally and progressed medially. Dissection continued until a large window was  created between the infundibulum, cystic duct, and the liver.  Once we had critical view, the clip was placed on the infundibulum and cystic duct junction and a small nick was made in the cystic duct.  The cholangiogram catheter was inserted.  This demonstrated no common bile duct filling defects and good flow of contrast into the duodenum.  The catheter was removed. Four clips were placed proximally on the cystic duct and it was divided. The gallbladder was removed from the liver  bed with cautery achieving excellent hemostasis along the way.  The gallbladder was placed in EndoCatch bag and removed from the abdomen via the supraumbilical port site.  The liver bed was copiously irrigated.  Clips were checked and were in good position.  Meticulous hemostasis was ensured in the liver bed.  There was no other bleeding from the areas of adhesiolysis.  After copious irrigation, the irrigation fluid returned clear.  Liver bed remained dry.  Ports were removed under direct vision.  Pneumoperitoneum was released.  The supraumbilical fascia was closed by tying the 0 Vicryl pursestring suture with care not to trap any intraabdominal contents.  All four wounds were copiously irrigated.  The skin of each was closed with a running 4-0 Vicryl subcuticular stitch followed by Dermabond.  Sponge, needle, and instrument counts were all correct.  The patient tolerated the procedure well without apparent complication and was taken to the recovery room in stable condition.     Gabrielle Dare Janee Morn, M.D.     BET/MEDQ  D:  07/24/2010  T:  07/24/2010  Job:  [id number]  cc:   Shirley Friar, MD  Electronically Signed by Violeta Gelinas M.D. on 07/25/2010 02:46:56 PM

## 2010-08-12 ENCOUNTER — Other Ambulatory Visit (HOSPITAL_COMMUNITY): Payer: Medicare Other | Admitting: Radiology

## 2010-08-12 ENCOUNTER — Ambulatory Visit: Payer: Medicare Other | Admitting: Cardiovascular Disease

## 2010-08-12 ENCOUNTER — Encounter (INDEPENDENT_AMBULATORY_CARE_PROVIDER_SITE_OTHER): Payer: Medicare Other | Admitting: General Surgery

## 2010-08-17 ENCOUNTER — Other Ambulatory Visit (INDEPENDENT_AMBULATORY_CARE_PROVIDER_SITE_OTHER): Payer: Self-pay

## 2010-08-17 DIAGNOSIS — G8918 Other acute postprocedural pain: Secondary | ICD-10-CM

## 2010-08-17 MED ORDER — HYDROCODONE-ACETAMINOPHEN 5-325 MG PO TABS: 1.0000 | ORAL_TABLET | ORAL | Status: AC | PRN

## 2010-08-17 NOTE — Telephone Encounter (Signed)
Pt called for refill on Percocet.  She has had no refill s/p her gb surgery so I called in the refill protocol of Hydrocodone to [facility] [phone]

## 2010-08-26 ENCOUNTER — Encounter (INDEPENDENT_AMBULATORY_CARE_PROVIDER_SITE_OTHER): Payer: Self-pay

## 2010-08-28 ENCOUNTER — Ambulatory Visit (HOSPITAL_COMMUNITY): Payer: Medicare Other | Attending: Cardiovascular Disease | Admitting: Radiology

## 2010-08-28 ENCOUNTER — Other Ambulatory Visit (HOSPITAL_COMMUNITY): Payer: Self-pay | Admitting: Cardiovascular Disease

## 2010-08-28 DIAGNOSIS — I059 Rheumatic mitral valve disease, unspecified: Secondary | ICD-10-CM | POA: Insufficient documentation

## 2010-08-28 DIAGNOSIS — E785 Hyperlipidemia, unspecified: Secondary | ICD-10-CM | POA: Insufficient documentation

## 2010-08-28 DIAGNOSIS — I10 Essential (primary) hypertension: Secondary | ICD-10-CM | POA: Insufficient documentation

## 2010-08-28 DIAGNOSIS — I313 Pericardial effusion (noninflammatory): Secondary | ICD-10-CM

## 2010-08-28 DIAGNOSIS — I319 Disease of pericardium, unspecified: Secondary | ICD-10-CM | POA: Insufficient documentation

## 2010-09-02 ENCOUNTER — Other Ambulatory Visit: Payer: Self-pay | Admitting: Rheumatology

## 2010-09-02 ENCOUNTER — Encounter (INDEPENDENT_AMBULATORY_CARE_PROVIDER_SITE_OTHER): Payer: Self-pay | Admitting: General Surgery

## 2010-09-02 ENCOUNTER — Ambulatory Visit (INDEPENDENT_AMBULATORY_CARE_PROVIDER_SITE_OTHER): Payer: Medicare Other | Admitting: General Surgery

## 2010-09-02 ENCOUNTER — Ambulatory Visit
Admission: RE | Admit: 2010-09-02 | Discharge: 2010-09-02 | Disposition: A | Discharge status: Home / Self Care | Payer: Medicare Other | Source: Ambulatory Visit | Attending: Rheumatology | Admitting: Rheumatology

## 2010-09-02 VITALS — BP 106/68 | HR 68 | Temp 94.6°F

## 2010-09-02 DIAGNOSIS — M545 Low back pain: Secondary | ICD-10-CM

## 2010-09-02 DIAGNOSIS — Z9889 Other specified postprocedural states: Secondary | ICD-10-CM

## 2010-09-02 DIAGNOSIS — Z9049 Acquired absence of other specified parts of digestive tract: Secondary | ICD-10-CM

## 2010-09-02 NOTE — Progress Notes (Signed)
Subjective:     Patient ID: [name] [name], female   DOB: 22-Apr-1950, 60 y.o.   MRN: [id number]  HPI Patient is status post laparoscopic cholecystectomy. Patient had a previous history of hepatic abscess. Postoperatively the patient is doing well aside from some constipation. She still has some soreness around her navel. She is eating well.  Review of Systems     Objective:   Physical Exam Patient is alert and appears well. Abdomen is soft. All 4 wounds are well healed without signs of infection. She has no significant tenderness. No masses are felt.    Assessment:     Doing well status post laparoscopic cholecystectomy.    Plan:     No Heavy lifting for a total of 6 weeks after surgery. Try milk of magnesia for constipation. Return p.r.n. The patient's pathology report showed chronic cholecystitis. This was very likely the cause of her hepatic abscess.

## 2011-02-16 ENCOUNTER — Other Ambulatory Visit (HOSPITAL_COMMUNITY): Payer: Self-pay | Admitting: Cardiovascular Disease

## 2011-02-16 DIAGNOSIS — I313 Pericardial effusion (noninflammatory): Secondary | ICD-10-CM

## 2011-02-23 ENCOUNTER — Other Ambulatory Visit: Payer: Self-pay

## 2011-02-23 ENCOUNTER — Encounter: Payer: Self-pay | Admitting: Cardiovascular Disease

## 2011-02-23 ENCOUNTER — Ambulatory Visit (INDEPENDENT_AMBULATORY_CARE_PROVIDER_SITE_OTHER): Payer: PRIVATE HEALTH INSURANCE | Admitting: Cardiovascular Disease

## 2011-02-23 ENCOUNTER — Ambulatory Visit (HOSPITAL_COMMUNITY): Payer: PRIVATE HEALTH INSURANCE | Attending: Cardiology | Admitting: Radiology

## 2011-02-23 ENCOUNTER — Encounter (INDEPENDENT_AMBULATORY_CARE_PROVIDER_SITE_OTHER): Payer: PRIVATE HEALTH INSURANCE | Admitting: Cardiovascular Disease

## 2011-02-23 DIAGNOSIS — I319 Disease of pericardium, unspecified: Secondary | ICD-10-CM | POA: Insufficient documentation

## 2011-02-23 DIAGNOSIS — R0989 Other specified symptoms and signs involving the circulatory and respiratory systems: Secondary | ICD-10-CM

## 2011-02-23 DIAGNOSIS — I10 Essential (primary) hypertension: Secondary | ICD-10-CM

## 2011-02-23 DIAGNOSIS — E785 Hyperlipidemia, unspecified: Secondary | ICD-10-CM | POA: Insufficient documentation

## 2011-02-23 DIAGNOSIS — I313 Pericardial effusion (noninflammatory): Secondary | ICD-10-CM

## 2011-02-23 MED ORDER — CARVEDILOL 25 MG PO TABS: 25.0000 mg | ORAL_TABLET | Freq: Two times a day (BID) | ORAL | Status: DC

## 2011-02-23 NOTE — Assessment & Plan Note (Signed)
[name] [name] had a moderate-sized pericardial effusion during her hospitalization last February. Her pericardial effusion is no moderate. She does not have any specific collagen vascular disease. There is no evidence of infection.  We'll continue with her same medications. I'll see her again in one year.  She'll follow with her medical doctor for management of her hypertension and hyperlipidemia.

## 2011-02-23 NOTE — Assessment & Plan Note (Signed)
[name] [name]'s blood pressure was a little bit elevated today. We'll change her metoprolol to carvedilol 25 mg twice a day. She'll be seeing Dr. Pecola Leisure next week we'll have her followup with Dr. Pecola Leisure for further management of her hypertension. I will be happy to see her sooner than scheduled if needed for further advice regarding her high blood pressure.

## 2011-02-23 NOTE — Progress Notes (Signed)
[name] [name] Date of Birth  [birth year] [facility]     [place] [facility]  [address]    [address]   [address] [place], [place]  [address]    [place], [place]  [address] [phone]  Fax  [phone]  [phone]  Fax [phone]  Problem list: 1. Pericardial effusion-moderate. This was associated with a hepatic abscess that was discovered in February, 2012.  Followup echocardiogram performed in February, 2013 revealed a small pericardial effusion. There is no evidence of tamponade. 2. Hypertension 3. Diabetes mellitus 4. Bipolar disease  History of Present Illness:  [name] [name] is a 61 year old female who we saw last year for further evaluation of a pericardial effusion that was diagnosed when she was hospitalized with a hepatic abscess. The pericardial effusion has resolved and has not recurred. Her echocardiogram from last week reveals only a small pericardial effusion. There is no hemodynamic compromise.  Is involved is doing well. She feels quite well. She's not having any episodes of chest pain or shortness of breath.  Current Outpatient Prescriptions on File Prior to Visit  Medication Sig Dispense Refill  . amLODipine (NORVASC) 5 MG tablet Take 5 mg by mouth daily.        . benztropine (COGENTIN) 2 MG tablet Take 1 mg by mouth 2 (two) times daily.       . ferrous gluconate (FERGON) 325 MG tablet Take 325 mg by mouth 2 (two) times daily.        . furosemide (LASIX) 40 MG tablet Take 40 mg by mouth daily.        Marland Kitchen glipiZIDE-metformin (METAGLIP) 5-500 MG per tablet Take 2 tablets by mouth 2 (two) times daily before a meal.       . hydrOXYzine (ATARAX) 25 MG tablet Take 25 mg by mouth 2 (two) times daily.        Marland Kitchen oxyCODONE (OXYCONTIN) 10 MG 12 hr tablet Take 10 mg by mouth every 8 (eight) hours as needed.        . pantoprazole (PROTONIX) 40 MG tablet Take 40 mg by mouth daily.        . QUEtiapine (SEROQUEL) 50 MG tablet Take 50 mg by mouth at  bedtime.        Marland Kitchen zolpidem (AMBIEN) 10 MG tablet Take 10 mg by mouth at bedtime as needed.          No Known Allergies  Past Medical History  Diagnosis Date  . Diabetes mellitus   . Hypertension   . Lack of adequate sleep   . Fatigue   . Full dentures   . Rash     on right breast  . Abdominal pain   . Hemorrhoids   . Wears glasses   . Constipation   . Arthritis   . Back pain     Past Surgical History  Procedure Date  . Cholecystectomy, laparoscopic 07/24/2010    History  Smoking status  . Never Smoker   Smokeless tobacco  . Not on file    History  Alcohol Use No    Family History  Problem Relation Age of Onset  . Alcohol abuse Other   . Diabetes Other     Reviw of Systems:  Reviewed in the HPI.  All other systems are negative.  Physical Exam: Blood pressure 145/95, pulse 64, height 5\' 3"  (1.6 m), weight 192 lb (87.091 kg). General: Well developed, well nourished, in no acute distress.  Head: Normocephalic, atraumatic,  sclera non-icteric, mucus membranes are moist,   Neck: Supple. Negative for carotid bruits. JVD not elevated.  Lungs: Clear bilaterally to auscultation without wheezes, rales, or rhonchi. Breathing is unlabored.  Heart: RRR with S1 S2. No murmurs, rubs, or gallops appreciated.  Abdomen: Soft, non-tender, non-distended with normoactive bowel sounds. No hepatomegaly. No rebound/guarding. No obvious abdominal masses.  Msk:  Strength and tone appear normal for age.  Extremities: No clubbing or cyanosis. No edema.  Distal pedal pulses are 2+ and equal bilaterally.  Neuro: Alert and oriented X 3. Moves all extremities spontaneously.  Psych:  Responds to questions appropriately with a normal affect.  ECG:  Assessment / Plan:

## 2011-02-23 NOTE — Patient Instructions (Addendum)
Your physician wants you to follow-up in: 1 YEAR You will receive a reminder letter in the mail two months in advance. If you don't receive a letter, please call our office to schedule the follow-up appointment.  Your physician has recommended you make the following change in your medication: STOP METOPROLOL START COREG 25 MG ONE TABLET TWICE A DAY   F/U WITH PRIMARY CARE DR RE MED/ BP CHANGES AT YOUR OFFICE VISIT NEXT WEEK

## 2011-02-24 NOTE — Progress Notes (Signed)
This encounter was created in error - please disregard.

## 2011-03-26 ENCOUNTER — Emergency Department (INDEPENDENT_AMBULATORY_CARE_PROVIDER_SITE_OTHER)
Admission: EM | Admit: 2011-03-26 | Discharge: 2011-03-26 | Disposition: A | Discharge status: Home / Self Care | Payer: PRIVATE HEALTH INSURANCE | Source: Home / Self Care | Attending: Family Medicine | Admitting: Family Medicine

## 2011-03-26 ENCOUNTER — Encounter (HOSPITAL_COMMUNITY): Payer: Self-pay | Admitting: *Deleted

## 2011-03-26 DIAGNOSIS — J111 Influenza due to unidentified influenza virus with other respiratory manifestations: Secondary | ICD-10-CM

## 2011-03-26 HISTORY — DX: Hepatomegaly, not elsewhere classified: R16.0

## 2011-03-26 MED ORDER — GUAIFENESIN-CODEINE 100-10 MG/5ML PO SYRP: 5.0000 mL | ORAL_SOLUTION | Freq: Three times a day (TID) | ORAL | Status: AC | PRN

## 2011-03-26 MED ORDER — AZITHROMYCIN 250 MG PO TABS: 250.0000 mg | ORAL_TABLET | Freq: Every day | ORAL | Status: AC

## 2011-03-26 NOTE — ED Provider Notes (Signed)
History     CSN: [id number]  Arrival date & time 03/26/11  1050   First MD Initiated Contact with Patient 03/26/11 1228      Chief Complaint  Patient presents with  . Cough  . Fever  . Nasal Congestion    (Consider location/radiation/quality/duration/timing/severity/associated sxs/prior treatment) HPI Comments: Dajae presents for evaluation of one week of cough, chills, body aches, lower back pain, runny nose, and decreased appetite. She reports, that she's been taking [employer] for fever, without relief. She denied realize that this is acetaminophen as she cannot take acetaminophen secondary to liver disease. She has not tried ibuprofen. She also reports feeling dizzy and unsteady, when she sits up, so. She had to go home yesterday from church and lay down.  Patient is a 61 y.o. female presenting with flu symptoms. The history is provided by the patient.  Influenza This is a new problem. The current episode started more than 1 week ago. The problem occurs constantly. The problem has not changed since onset.Associated symptoms include headaches. The symptoms are aggravated by nothing. The symptoms are relieved by nothing. She has tried acetaminophen for the symptoms.    Past Medical History  Diagnosis Date  . Diabetes mellitus   . Hypertension   . Lack of adequate sleep   . Fatigue   . Full dentures   . Rash     on right breast  . Abdominal pain   . Hemorrhoids   . Wears glasses   . Constipation   . Arthritis   . Back pain   . Liver mass     Past Surgical History  Procedure Date  . Cholecystectomy, laparoscopic 07/24/2010    Family History  Problem Relation Age of Onset  . Alcohol abuse Other   . Diabetes Other     History  Substance Use Topics  . Smoking status: Never Smoker   . Smokeless tobacco: Not on file  . Alcohol Use: No    OB History    Grav Para Term Preterm Abortions TAB SAB Ect Mult Living                  Review of Systems    Constitutional: Positive for fever and chills.  HENT: Positive for congestion and rhinorrhea.   Eyes: Negative.   Respiratory: Positive for cough.   Cardiovascular: Negative.   Gastrointestinal: Negative.   Genitourinary: Negative.   Musculoskeletal: Positive for back pain.  Skin: Negative.   Neurological: Positive for light-headedness and headaches.    Allergies  Aspirin and Tylenol  Home Medications   Current Outpatient Rx  Name Route Sig Dispense Refill  . AMLODIPINE BESYLATE 5 MG PO TABS Oral Take 5 mg by mouth daily.      . AZITHROMYCIN 250 MG PO TABS Oral Take 1 tablet (250 mg total) by mouth daily. Take two tablets on first day, then one tablet each day for four days 6 tablet 0  . BENZTROPINE MESYLATE 2 MG PO TABS Oral Take 1 mg by mouth 2 (two) times daily.     Marland Kitchen CARVEDILOL 25 MG PO TABS Oral Take 1 tablet (25 mg total) by mouth 2 (two) times daily. 60 tablet 11  . FERROUS GLUCONATE 325 MG PO TABS Oral Take 325 mg by mouth 2 (two) times daily.      . FUROSEMIDE 40 MG PO TABS Oral Take 40 mg by mouth daily.      Marland Kitchen GLIPIZIDE-METFORMIN HCL 5-500 MG PO TABS Oral Take 2  tablets by mouth 2 (two) times daily before a meal.     . GUAIFENESIN-CODEINE 100-10 MG/5ML PO SYRP Oral Take 5 mLs by mouth 3 (three) times daily as needed for cough. 120 mL 0  . HYDROXYZINE HCL 25 MG PO TABS Oral Take 25 mg by mouth 2 (two) times daily.      . OXYCODONE HCL ER 10 MG PO TB12 Oral Take 10 mg by mouth every 8 (eight) hours as needed.      Marland Kitchen PANTOPRAZOLE SODIUM 40 MG PO TBEC Oral Take 40 mg by mouth daily.      . QUETIAPINE FUMARATE 50 MG PO TABS Oral Take 50 mg by mouth at bedtime.      Marland Kitchen ZOLPIDEM TARTRATE 10 MG PO TABS Oral Take 10 mg by mouth at bedtime as needed.        BP 132/81  Pulse 103  Temp(Src) 101.5 F (38.6 C) (Oral)  Resp 18  SpO2 98%  Physical Exam  Nursing note and vitals reviewed. Constitutional: She is oriented to person, place, and time. She appears well-developed and  well-nourished.  HENT:  Head: Normocephalic and atraumatic.  Right Ear: Tympanic membrane normal.  Left Ear: Tympanic membrane normal.  Mouth/Throat: Uvula is midline, oropharynx is clear and moist and mucous membranes are normal.  Eyes: EOM are normal.  Neck: Normal range of motion.  Pulmonary/Chest: Effort normal and breath sounds normal. She has no decreased breath sounds. She has no wheezes. She has no rhonchi.  Musculoskeletal: Normal range of motion.  Neurological: She is alert and oriented to person, place, and time.  Skin: Skin is warm and dry.  Psychiatric: Her behavior is normal.    ED Course  Procedures (including critical care time)  Labs Reviewed - No data to display No results found.   1. Influenza-like illness       MDM  Likely viral, given delayed rx for z-pack; given rx for guaifenesin AC        Renaee Munda, MD 03/26/11 1422

## 2011-03-26 NOTE — Discharge Instructions (Signed)
Your exam is not concerning for a bacterial infection, and this is most likely viral. I recommend supportive care with fever and pain control with ibuprofen 600 mg every 6 hours or 800 mg every 8 hours. Use nasal saline, available over the counter in brands such as Ayr, or Arm & Hammer, for nasal congestion. Also, there is evidence for Vitamin C daily. Use the prescribed cough syrup as directed. If no improvement in symptoms in 48 hours, fill antibiotic prescription. Return to care should your symptoms not improve, or worsen in any way.

## 2011-03-26 NOTE — ED Notes (Signed)
Pt is here with complaints of 1 week history of cough, chills, runny nose and decreased appetite.  Pt reports chest and back pain from coughing so much.

## 2012-12-21 IMAGING — CT CT ABD-PELV W/O CM
2 of 5 series · 17 of 46 positions shown, 19 images · non-contrast
Comparison: None.

CLINICAL DATA: Abdominal tenderness on the right

CT ABDOMEN AND PELVIS WITHOUT CONTRAST
TECHNIQUE: Multidetector CT imaging of the abdomen and pelvis was
performed following the standard protocol without intravenous
contrast.

[Series 2: abd/pelv w/o 5.0 b31f st · axial · non-contrast · 0.88mm/px · z∈[-430,+10]mm · 14 of 98 slices shown, 16 images]
[im 5/98  soft-tissue]
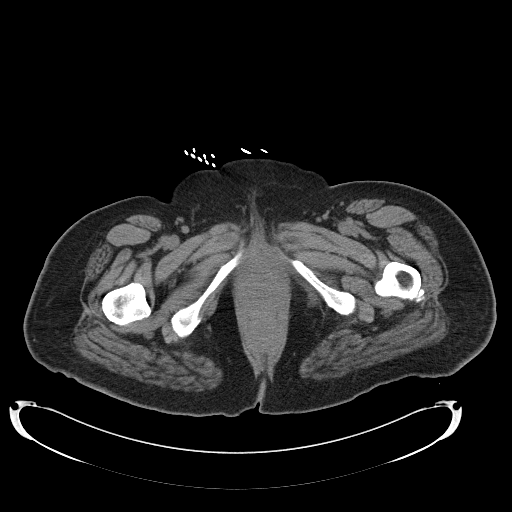
[im 5/98  bone]
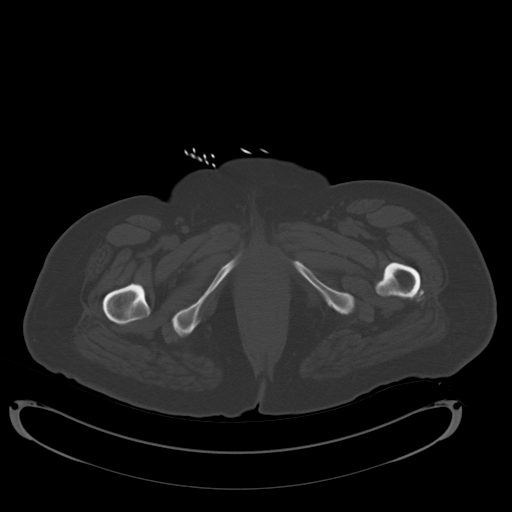
[im 13/98  soft-tissue]
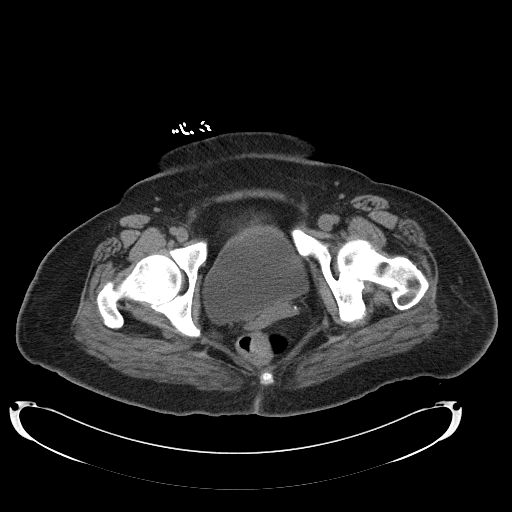
[im 21/98  soft-tissue]
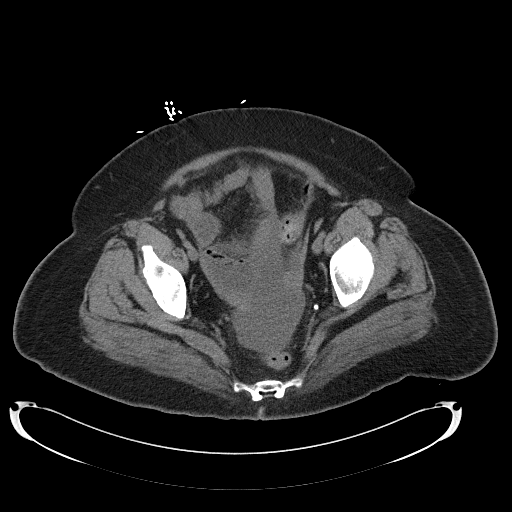
[im 25/98  soft-tissue]
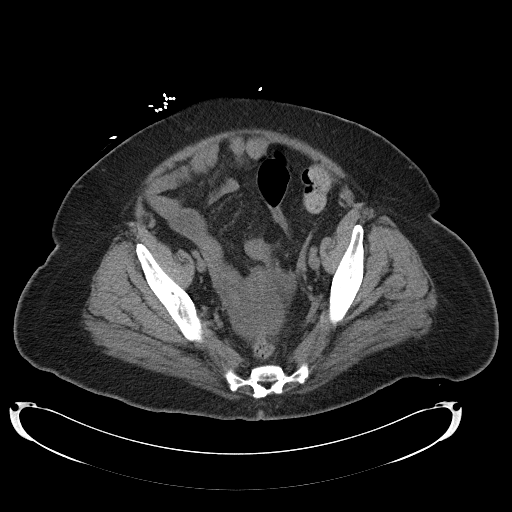
[im 33/98  soft-tissue]
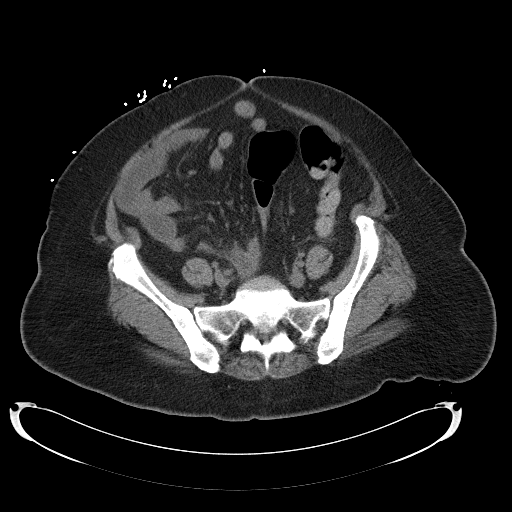
[im 41/98  soft-tissue]
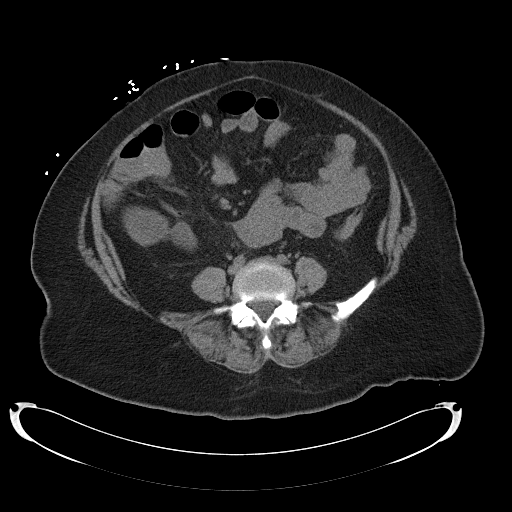
[im 45/98  soft-tissue]
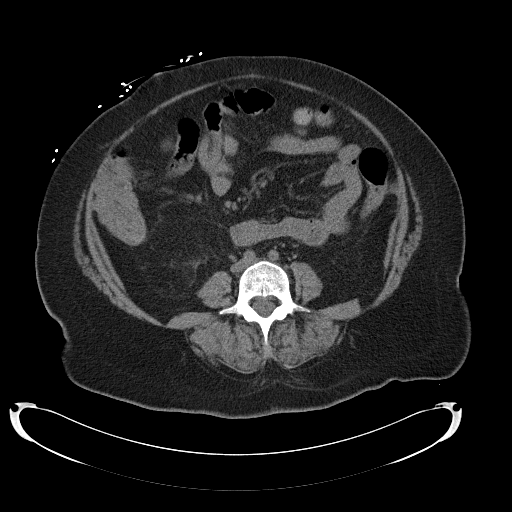
[im 53/98  soft-tissue]
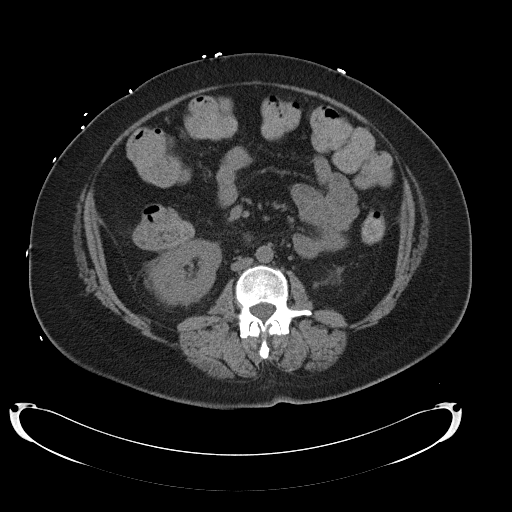
[im 57/98  soft-tissue]
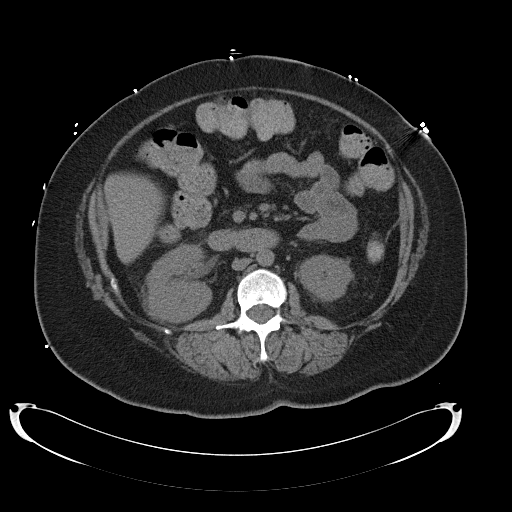
[im 57/98  bone]
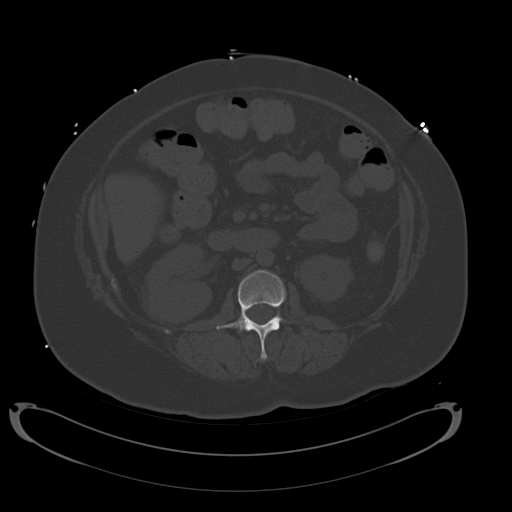
[im 65/98  soft-tissue]
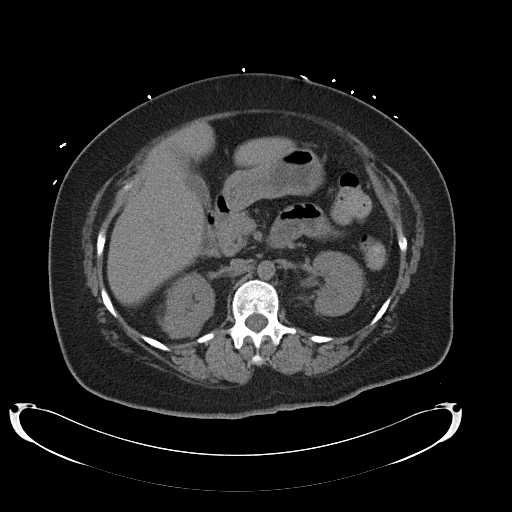
[im 73/98  soft-tissue]
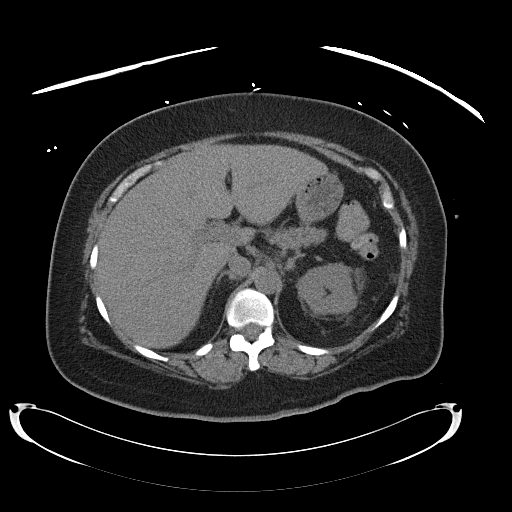
[im 77/98  soft-tissue]
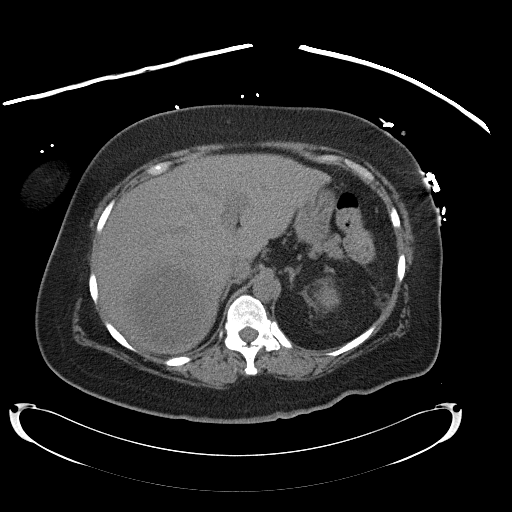
[im 85/98  soft-tissue]
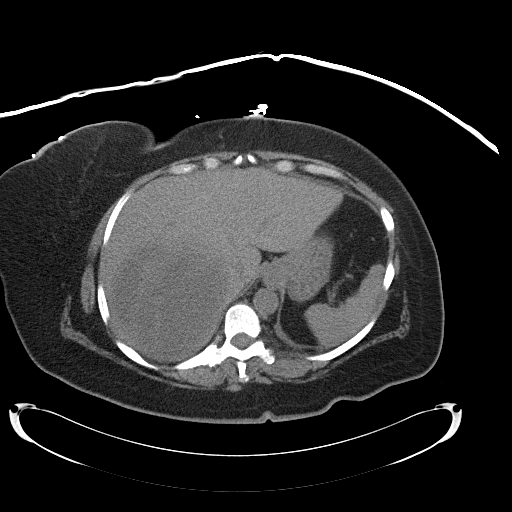
[im 93/98  soft-tissue]
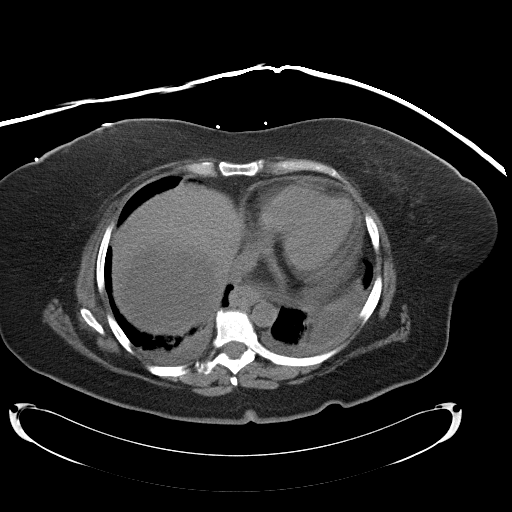

[Series 5: abd/pelv w/o 3.0 spo cor thins · coronal · non-contrast · 1.04mm/px · 3 of 89 slices shown]
[im 30/89  soft-tissue]
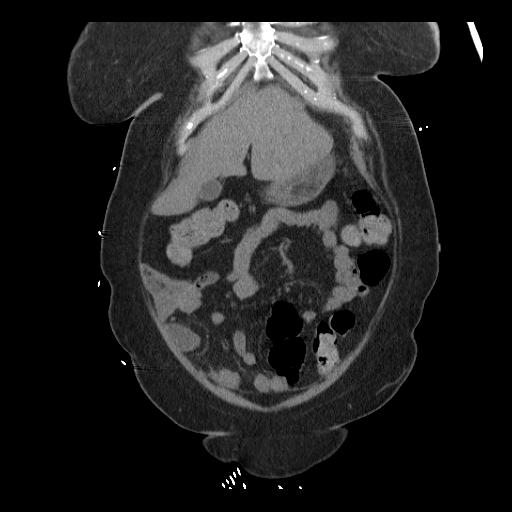
[im 40/89  soft-tissue]
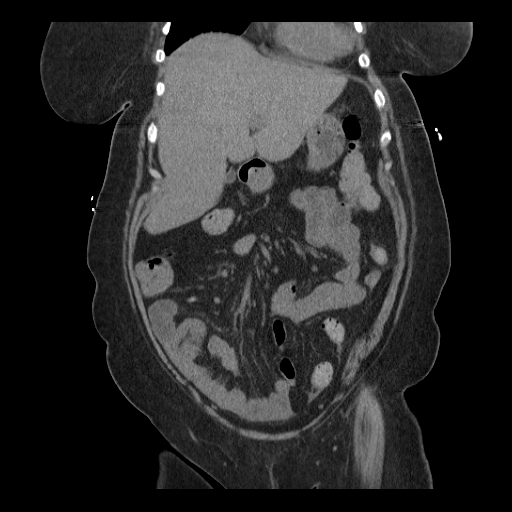
[im 49/89  soft-tissue]
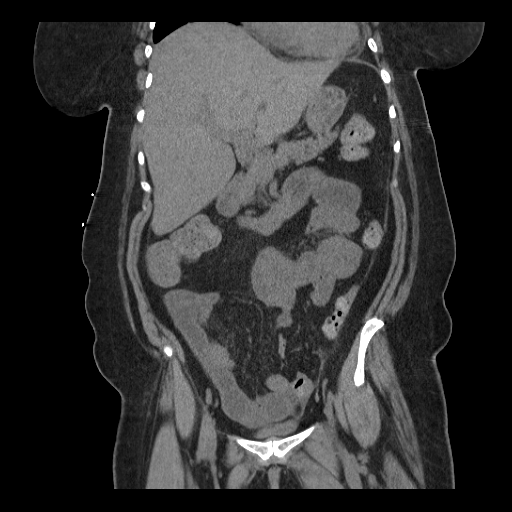

[17 of 46 positions shown; findings below may reference images not displayed]

FINDINGS: There are small pleural effusions.  There is a moderate
pericardial effusion.  There is patchy atelectasis posteriorly in
both lung bases.

There is a 10.5 cm low attenuation complex lesion in the posterior
right hepatic segment.  No other liver lesion evident on this
uninfused scan.

Partially calcified  sub centimeter gallstones are noted in the
gallbladder neck.  The gallbladder nondistended.

Unremarkable uninfused evaluation of adrenal glands, kidneys,
pancreas, abdominal aorta.

The stomach is nondistended.  Small bowel decompressed.  Normal
appendix.  The colon is nondilated containing moderate fecal
material.  There is a small amount of pelvic ascites.  Urinary
bladder incompletely distended.  Apparent prior hysterectomy.  No
free air.  Regional bones unremarkable.
IMPRESSION: 1.  10.5 cm right hepatic mass or abscess.  Recommend nonemergent
liver  MR with contrast for further characterization and treatment
planning.
2.  Moderate pericardial effusion and small pleural effusions.
3.  Small amount of pelvic ascites.
4.  Cholelithiasis.

## 2013-01-01 ENCOUNTER — Other Ambulatory Visit: Payer: Self-pay

## 2013-01-01 DIAGNOSIS — Z1231 Encounter for screening mammogram for malignant neoplasm of breast: Secondary | ICD-10-CM

## 2013-01-10 ENCOUNTER — Ambulatory Visit
Admission: RE | Admit: 2013-01-10 | Discharge: 2013-01-10 | Disposition: A | Discharge status: Home / Self Care | Payer: PRIVATE HEALTH INSURANCE | Source: Ambulatory Visit

## 2013-01-10 DIAGNOSIS — Z1231 Encounter for screening mammogram for malignant neoplasm of breast: Secondary | ICD-10-CM

## 2013-08-10 ENCOUNTER — Encounter: Payer: Self-pay | Admitting: Internal Medicine

## 2013-09-07 ENCOUNTER — Emergency Department (HOSPITAL_COMMUNITY)
Admission: EM | Admit: 2013-09-07 | Discharge: 2013-09-07 | Disposition: A | Discharge status: Home / Self Care | Payer: PRIVATE HEALTH INSURANCE | Attending: Emergency Medicine | Admitting: Emergency Medicine

## 2013-09-07 ENCOUNTER — Emergency Department (HOSPITAL_COMMUNITY): Payer: PRIVATE HEALTH INSURANCE

## 2013-09-07 ENCOUNTER — Encounter (HOSPITAL_COMMUNITY): Payer: Self-pay | Admitting: Emergency Medicine

## 2013-09-07 DIAGNOSIS — M129 Arthropathy, unspecified: Secondary | ICD-10-CM | POA: Insufficient documentation

## 2013-09-07 DIAGNOSIS — M549 Dorsalgia, unspecified: Secondary | ICD-10-CM | POA: Insufficient documentation

## 2013-09-07 DIAGNOSIS — Z8719 Personal history of other diseases of the digestive system: Secondary | ICD-10-CM | POA: Insufficient documentation

## 2013-09-07 DIAGNOSIS — G8929 Other chronic pain: Secondary | ICD-10-CM | POA: Insufficient documentation

## 2013-09-07 DIAGNOSIS — I10 Essential (primary) hypertension: Secondary | ICD-10-CM | POA: Insufficient documentation

## 2013-09-07 DIAGNOSIS — R112 Nausea with vomiting, unspecified: Secondary | ICD-10-CM

## 2013-09-07 DIAGNOSIS — R55 Syncope and collapse: Secondary | ICD-10-CM | POA: Diagnosis not present

## 2013-09-07 DIAGNOSIS — E119 Type 2 diabetes mellitus without complications: Secondary | ICD-10-CM | POA: Insufficient documentation

## 2013-09-07 DIAGNOSIS — R4182 Altered mental status, unspecified: Secondary | ICD-10-CM | POA: Insufficient documentation

## 2013-09-07 DIAGNOSIS — Z79899 Other long term (current) drug therapy: Secondary | ICD-10-CM | POA: Diagnosis not present

## 2013-09-07 LAB — CBC WITH DIFFERENTIAL/PLATELET
Basophils Absolute: 0 10*3/uL (ref 0.0–0.1)
Basophils Relative: 1 % (ref 0–1)
EOS ABS: 0.2 10*3/uL (ref 0.0–0.7)
EOS PCT: 2 % (ref 0–5)
HEMATOCRIT: 38.6 % (ref 36.0–46.0)
HEMOGLOBIN: 12.7 g/dL (ref 12.0–15.0)
LYMPHS ABS: 1.8 10*3/uL (ref 0.7–4.0)
LYMPHS PCT: 29 % (ref 12–46)
MCH: 26.1 pg (ref 26.0–34.0)
MCHC: 32.9 g/dL (ref 30.0–36.0)
MCV: 79.3 fL (ref 78.0–100.0)
MONO ABS: 0.3 10*3/uL (ref 0.1–1.0)
MONOS PCT: 6 % (ref 3–12)
Neutro Abs: 3.9 10*3/uL (ref 1.7–7.7)
Neutrophils Relative %: 62 % (ref 43–77)
PLATELETS: 274 10*3/uL (ref 150–400)
RBC: 4.87 MIL/uL (ref 3.87–5.11)
RDW: 15.3 % (ref 11.5–15.5)
WBC: 6.2 10*3/uL (ref 4.0–10.5)

## 2013-09-07 LAB — URINALYSIS, ROUTINE W REFLEX MICROSCOPIC
BILIRUBIN URINE: NEGATIVE
Glucose, UA: NEGATIVE mg/dL
HGB URINE DIPSTICK: NEGATIVE
Ketones, ur: 15 mg/dL — AB
NITRITE: NEGATIVE
PROTEIN: NEGATIVE mg/dL
SPECIFIC GRAVITY, URINE: 1.011 (ref 1.005–1.030)
UROBILINOGEN UA: 0.2 mg/dL (ref 0.0–1.0)
pH: 8 (ref 5.0–8.0)

## 2013-09-07 LAB — LIPASE, BLOOD: LIPASE: 16 U/L (ref 11–59)

## 2013-09-07 LAB — COMPREHENSIVE METABOLIC PANEL
ALT: 11 U/L (ref 0–35)
ANION GAP: 15 (ref 5–15)
AST: 10 U/L (ref 0–37)
Albumin: 3.3 g/dL — ABNORMAL LOW (ref 3.5–5.2)
Alkaline Phosphatase: 67 U/L (ref 39–117)
BUN: 9 mg/dL (ref 6–23)
CALCIUM: 9.3 mg/dL (ref 8.4–10.5)
CO2: 25 meq/L (ref 19–32)
CREATININE: 0.61 mg/dL (ref 0.50–1.10)
Chloride: 99 mEq/L (ref 96–112)
GLUCOSE: 143 mg/dL — AB (ref 70–99)
Potassium: 4.1 mEq/L (ref 3.7–5.3)
Sodium: 139 mEq/L (ref 137–147)
TOTAL PROTEIN: 7.6 g/dL (ref 6.0–8.3)
Total Bilirubin: 0.3 mg/dL (ref 0.3–1.2)

## 2013-09-07 LAB — I-STAT TROPONIN, ED: Troponin i, poc: 0 ng/mL (ref 0.00–0.08)

## 2013-09-07 LAB — URINE MICROSCOPIC-ADD ON

## 2013-09-07 LAB — CBG MONITORING, ED: GLUCOSE-CAPILLARY: 132 mg/dL — AB (ref 70–99)

## 2013-09-07 MED ORDER — ONDANSETRON 4 MG PO TBDP: ORAL_TABLET | ORAL | Status: DC

## 2013-09-07 MED ORDER — SODIUM CHLORIDE 0.9 % IV BOLUS (SEPSIS)
1000.0000 mL | Freq: Once | INTRAVENOUS | Status: AC
  Administered 2013-09-07: 1000 mL via INTRAVENOUS

## 2013-09-07 MED ORDER — OXYCODONE HCL 5 MG PO TABS: 5.0000 mg | ORAL_TABLET | ORAL | Status: DC | PRN

## 2013-09-07 MED ORDER — ONDANSETRON HCL 4 MG/2ML IJ SOLN
4.0000 mg | Freq: Once | INTRAMUSCULAR | Status: AC
Start: 2013-09-07 — End: 2013-09-07
  Administered 2013-09-07: 4 mg via INTRAVENOUS
  Filled 2013-09-07: qty 2

## 2013-09-07 MED ORDER — KETOROLAC TROMETHAMINE 30 MG/ML IJ SOLN
30.0000 mg | Freq: Once | INTRAMUSCULAR | Status: AC
Start: 2013-09-07 — End: 2013-09-07
  Administered 2013-09-07: 30 mg via INTRAVENOUS
  Filled 2013-09-07: qty 1

## 2013-09-07 MED ORDER — ONDANSETRON HCL 4 MG/2ML IJ SOLN
4.0000 mg | Freq: Once | INTRAMUSCULAR | Status: AC
  Administered 2013-09-07: 4 mg via INTRAVENOUS
  Filled 2013-09-07: qty 2

## 2013-09-07 MED ORDER — ONDANSETRON 4 MG PO TBDP: 4.0000 mg | ORAL_TABLET | Freq: Three times a day (TID) | ORAL | Status: DC | PRN

## 2013-09-07 MED ORDER — OXYCODONE HCL 5 MG PO CAPS: 5.0000 mg | ORAL_CAPSULE | ORAL | Status: DC | PRN

## 2013-09-07 NOTE — ED Notes (Signed)
Pt from home c/o N/V and  Back pain; per family started sometime this am; pt hyperventilating at present; pt appears tearful; pt answers some questions appropriately but will not look at this RN

## 2013-09-07 NOTE — ED Notes (Signed)
Lab states that urine microscopic is still in process

## 2013-09-07 NOTE — ED Notes (Signed)
CBG 132 reported to nurse

## 2013-09-07 NOTE — Discharge Instructions (Signed)
Follow up with your primary care doctor about your hospital visit. Continue to hydrate orally.Take all medications as prescribed & use Zofran as directed for nausea & vomiting.  Read the instructions below for reasons to return to the ER.   The 'BRAT' diet is suggested, then progress to diet as tolerated as symptoms abate. Call if bloody stools, persistent diarrhea, vomiting, fever or abdominal pain. Bananas.  Rice.  Applesauce.  Toast (and other simple starches such as crackers, potatoes, noodles).   SEEK IMMEDIATE MEDICAL ATTENTION IF:  You begin having localized abdominal pain that does not go away or becomes severe (The right side could  possibly be appendicitis. In an adult, the left lower portion of the abdomen could be colitis or diverticulitis)   A temperature above 101 develops  Repeated vomiting occurs (multiple uncontrollable episodes) or you are unable to keep fluids down  Blood is being passed in stools or vomit (bright red or black tarry stools).   Return also if you develop chest pain, difficulty breathing, dizziness or fainting, or become confused, poorly responsive, or inconsolable (young children).

## 2013-09-07 NOTE — ED Notes (Signed)
Called lab will add lipase to available blood.

## 2013-09-07 NOTE — ED Notes (Signed)
PA at bedside.

## 2013-09-07 NOTE — ED Provider Notes (Signed)
CSN: [id number]     Arrival date & time 09/07/13  1221 History   First MD Initiated Contact with Patient 09/07/13 1300     Chief Complaint  Patient presents with  . Back Pain  . Emesis  . Altered Mental Status     (Consider location/radiation/quality/duration/timing/severity/associated sxs/prior Treatment) HPI Comments: Patient presenting with several complaints.  She reports that she has chronic back pain, which has been present since 2012.  She reports that her back pain today is no different than the pain that she has had in the past.  Pain does not radiate.  She also reports that this morning she had two episodes of emesis and also felt like she was going to pass out.  She denies syncope.  She denies any chest pain, SOB, abdominal pain, diarrhea, numbness, tingling, or bowel/bladder incontinence.  Denies urinary symptoms.  She reports that she has had near syncope several times in the past and has been evaluated by Cardiology for this.  She reports that she is unsure what causes her to feel like she is going to pass out.  Review of the chart shows that the patient has a history of Pericardial Effusion.  Patient has not taken anything for symptoms prior to arrival.  Past abdominal surgeries include a laparoscopic cholecystectomy.    The history is provided by the patient.    Past Medical History  Diagnosis Date  . Diabetes mellitus   . Hypertension   . Lack of adequate sleep   . Fatigue   . Full dentures   . Rash     on right breast  . Abdominal pain   . Hemorrhoids   . Wears glasses   . Constipation   . Arthritis   . Back pain   . Liver mass    Past Surgical History  Procedure Laterality Date  . Cholecystectomy, laparoscopic  07/24/2010   Family History  Problem Relation Age of Onset  . Alcohol abuse Other   . Diabetes Other    History  Substance Use Topics  . Smoking status: Never Smoker   . Smokeless tobacco: Not on file  . Alcohol Use: No   OB History   Grav  Para Term Preterm Abortions TAB SAB Ect Mult Living                 Review of Systems  All other systems reviewed and are negative.     Allergies  Aspirin and Tylenol  Home Medications   Prior to Admission medications   Medication Sig Start Date End Date Taking? Authorizing Provider  amLODipine (NORVASC) 5 MG tablet Take 5 mg by mouth daily.      Historical Provider, MD  benztropine (COGENTIN) 2 MG tablet Take 1 mg by mouth 2 (two) times daily.     Historical Provider, MD  carvedilol (COREG) 25 MG tablet Take 1 tablet (25 mg total) by mouth 2 (two) times daily. 02/23/11 02/23/12  Vesta Mixer, MD  ferrous gluconate (FERGON) 325 MG tablet Take 325 mg by mouth 2 (two) times daily.      Historical Provider, MD  furosemide (LASIX) 40 MG tablet Take 40 mg by mouth daily.      Historical Provider, MD  glipiZIDE-metformin (METAGLIP) 5-500 MG per tablet Take 2 tablets by mouth 2 (two) times daily before a meal.     Historical Provider, MD  hydrOXYzine (ATARAX) 25 MG tablet Take 25 mg by mouth 2 (two) times daily.  Historical Provider, MD  oxyCODONE (OXYCONTIN) 10 MG 12 hr tablet Take 10 mg by mouth every 8 (eight) hours as needed.      Historical Provider, MD  pantoprazole (PROTONIX) 40 MG tablet Take 40 mg by mouth daily.      Historical Provider, MD  QUEtiapine (SEROQUEL) 50 MG tablet Take 50 mg by mouth at bedtime.      Historical Provider, MD  zolpidem (AMBIEN) 10 MG tablet Take 10 mg by mouth at bedtime as needed.      Historical Provider, MD   BP 178/82  Pulse 55  Temp(Src) 98.3 F (36.8 C) (Oral)  Resp 22  SpO2 100% Physical Exam  Nursing note and vitals reviewed. Constitutional: She appears well-developed and well-nourished.  HENT:  Head: Normocephalic and atraumatic.  Mouth/Throat: Oropharynx is clear and moist.  Eyes: EOM are normal. Pupils are equal, round, and reactive to light.  Neck: Normal range of motion. Neck supple.  Cardiovascular: Normal rate, regular  rhythm and normal heart sounds.   Pulmonary/Chest: Effort normal and breath sounds normal.  Abdominal: Soft. Bowel sounds are normal. She exhibits no distension and no mass. There is no rebound and no guarding.  Patient reports back pain with palpation of the abdomen  Musculoskeletal: Normal range of motion.       Cervical back: She exhibits no tenderness, no bony tenderness, no swelling, no edema and no deformity.       Thoracic back: She exhibits no tenderness, no bony tenderness, no swelling, no edema and no deformity.       Lumbar back: She exhibits no tenderness, no bony tenderness, no swelling, no edema and no deformity.  Neurological: She is alert. She has normal strength. No cranial nerve deficit or sensory deficit.  Reflex Scores:      Patellar reflexes are 2+ on the right side and 2+ on the left side. Skin: Skin is warm and dry.  Psychiatric: She has a normal mood and affect.    ED Course  Procedures (including critical care time) Labs Review Labs Reviewed  COMPREHENSIVE METABOLIC PANEL - Abnormal; Notable for the following:    Glucose, Bld 143 (*)    Albumin 3.3 (*)    All other components within normal limits  CBC WITH DIFFERENTIAL  LIPASE, BLOOD  URINALYSIS, ROUTINE W REFLEX MICROSCOPIC  CBG MONITORING, ED  Rosezena Sensor, ED    Imaging Review Dg Chest 2 View  09/07/2013   CLINICAL DATA:  Syncopal episode  EXAM: CHEST  2 VIEW  COMPARISON:  05/09/2010  FINDINGS: Cardiac shadow is within normal limits. The lungs are clear bilaterally. No focal infiltrate is noted. No bony abnormality is seen.  IMPRESSION: No active cardiopulmonary disease.   Electronically Signed   By: Alcide Clever M.D.   On: 09/07/2013 14:00     EKG Interpretation None      Date: 09/07/2013  Rate: 67  Rhythm: normal sinus rhythm  QRS Axis: normal  Intervals: normal  ST/T Wave abnormalities: normal  Conduction Disutrbances:none  Narrative Interpretation: low voltage  Old EKG Reviewed:  unchanged  4:35 PM Reassessed patient.  She reports that symptoms have improved at this time. MDM   Final diagnoses:  None   Patient presenting today with chronic back pain, vomiting, and near syncope.  Initial chief complaint stated AMS.  However, patient answering questions appropriately in the ED.  Family with her report that they have not noticed any confusion.  Back pain is chronic and similar to the pain  that she has had in the past.  No neurological deficits and normal neuro exam.  Patient can walk but states is painful.  No loss of bowel or bladder control.  No concern for cauda equina.  No fever, night sweats, weight loss, h/o cancer, IVDU.  Labs today unremarkable.  Patient also complaining of near syncope and vomiting.  No vomiting in ED.  Labs unremarkable.  No syncope.  EKG unremarkable.  Troponin negative.  No focal neurological deficits on exam.  VSS.  CXR negative. Feel that the patient is stable for discharge.  Patient discussed with Dr. Jeraldine Loots who also evaluated the patient and is in agreement with the plan.  Return precautions given to the patient.       Santiago Glad, PA-C 09/07/13 1646

## 2013-09-08 LAB — URINE CULTURE: Colony Count: 40000

## 2013-09-09 NOTE — ED Provider Notes (Signed)
  This was a shared visit with a mid-level provided (NP or PA).  Throughout the patient's course I was available for consultation/collaboration.  I saw the ECG (if appropriate), relevant labs and studies - I agree with the interpretation.  On my exam the patient was in no distress.  She was awake, alert, with improved condition. Patient had no neurologic function, was not in distress. Patient's EKG showed sinus rhythm, rate 67, unremarkable I had a lengthy conversation with her and the multiple family members about her presentation, her history of chronic pain, these results.       Gerhard Munch, MD 09/09/13 [phone]

## 2013-09-11 ENCOUNTER — Emergency Department (HOSPITAL_COMMUNITY): Payer: PRIVATE HEALTH INSURANCE

## 2013-09-11 ENCOUNTER — Encounter (HOSPITAL_COMMUNITY): Payer: Self-pay | Admitting: Emergency Medicine

## 2013-09-11 ENCOUNTER — Emergency Department (HOSPITAL_COMMUNITY)
Admission: EM | Admit: 2013-09-11 | Discharge: 2013-09-11 | Disposition: A | Discharge status: Home / Self Care | Payer: PRIVATE HEALTH INSURANCE | Attending: Emergency Medicine | Admitting: Emergency Medicine

## 2013-09-11 DIAGNOSIS — R109 Unspecified abdominal pain: Secondary | ICD-10-CM | POA: Insufficient documentation

## 2013-09-11 DIAGNOSIS — R112 Nausea with vomiting, unspecified: Secondary | ICD-10-CM | POA: Diagnosis not present

## 2013-09-11 DIAGNOSIS — Z8719 Personal history of other diseases of the digestive system: Secondary | ICD-10-CM | POA: Diagnosis not present

## 2013-09-11 DIAGNOSIS — E119 Type 2 diabetes mellitus without complications: Secondary | ICD-10-CM | POA: Diagnosis not present

## 2013-09-11 DIAGNOSIS — Z8739 Personal history of other diseases of the musculoskeletal system and connective tissue: Secondary | ICD-10-CM | POA: Insufficient documentation

## 2013-09-11 DIAGNOSIS — I10 Essential (primary) hypertension: Secondary | ICD-10-CM | POA: Diagnosis not present

## 2013-09-11 DIAGNOSIS — Z98811 Dental restoration status: Secondary | ICD-10-CM | POA: Insufficient documentation

## 2013-09-11 DIAGNOSIS — Z79899 Other long term (current) drug therapy: Secondary | ICD-10-CM | POA: Diagnosis not present

## 2013-09-11 DIAGNOSIS — R55 Syncope and collapse: Secondary | ICD-10-CM | POA: Insufficient documentation

## 2013-09-11 LAB — COMPREHENSIVE METABOLIC PANEL
ALT: 10 U/L (ref 0–35)
ANION GAP: 15 (ref 5–15)
AST: 16 U/L (ref 0–37)
Albumin: 2.9 g/dL — ABNORMAL LOW (ref 3.5–5.2)
Alkaline Phosphatase: 64 U/L (ref 39–117)
BUN: 9 mg/dL (ref 6–23)
CO2: 28 mEq/L (ref 19–32)
CREATININE: 0.59 mg/dL (ref 0.50–1.10)
Calcium: 9.2 mg/dL (ref 8.4–10.5)
Chloride: 92 mEq/L — ABNORMAL LOW (ref 96–112)
GFR calc non Af Amer: >90 mL/min (ref >90)
Glucose, Bld: 156 mg/dL — ABNORMAL HIGH (ref 70–99)
Potassium: 3.7 mEq/L (ref 3.7–5.3)
Sodium: 135 mEq/L — ABNORMAL LOW (ref 137–147)
TOTAL PROTEIN: 7.2 g/dL (ref 6.0–8.3)
Total Bilirubin: 0.4 mg/dL (ref 0.3–1.2)

## 2013-09-11 LAB — I-STAT TROPONIN, ED: Troponin i, poc: 0 ng/mL (ref 0.00–0.08)

## 2013-09-11 LAB — CBC WITH DIFFERENTIAL/PLATELET
Basophils Absolute: 0 10*3/uL (ref 0.0–0.1)
Basophils Relative: 0 % (ref 0–1)
EOS ABS: 0 10*3/uL (ref 0.0–0.7)
EOS PCT: 0 % (ref 0–5)
HCT: 38 % (ref 36.0–46.0)
Hemoglobin: 12.5 g/dL (ref 12.0–15.0)
LYMPHS ABS: 1.7 10*3/uL (ref 0.7–4.0)
Lymphocytes Relative: 21 % (ref 12–46)
MCH: 26 pg (ref 26.0–34.0)
MCHC: 32.9 g/dL (ref 30.0–36.0)
MCV: 79.2 fL (ref 78.0–100.0)
MONO ABS: 0.8 10*3/uL (ref 0.1–1.0)
Monocytes Relative: 10 % (ref 3–12)
Neutro Abs: 5.7 10*3/uL (ref 1.7–7.7)
Neutrophils Relative %: 69 % (ref 43–77)
PLATELETS: 235 10*3/uL (ref 150–400)
RBC: 4.8 MIL/uL (ref 3.87–5.11)
RDW: 14.7 % (ref 11.5–15.5)
WBC: 8.3 10*3/uL (ref 4.0–10.5)

## 2013-09-11 LAB — LIPASE, BLOOD: LIPASE: 14 U/L (ref 11–59)

## 2013-09-11 MED ORDER — IOHEXOL 300 MG/ML  SOLN
25.0000 mL | INTRAMUSCULAR | Status: AC
Start: 2013-09-11 — End: 2013-09-11
  Administered 2013-09-11: 25 mL via ORAL

## 2013-09-11 MED ORDER — SODIUM CHLORIDE 0.9 % IV BOLUS (SEPSIS)
500.0000 mL | Freq: Once | INTRAVENOUS | Status: AC
Start: 2013-09-11 — End: 2013-09-11
  Administered 2013-09-11: 500 mL via INTRAVENOUS

## 2013-09-11 MED ORDER — PROMETHAZINE HCL 25 MG PO TABS: 25.0000 mg | ORAL_TABLET | Freq: Four times a day (QID) | ORAL | Status: DC | PRN

## 2013-09-11 MED ORDER — MECLIZINE HCL 25 MG PO TABS
25.0000 mg | ORAL_TABLET | Freq: Once | ORAL | Status: AC
  Administered 2013-09-11: 25 mg via ORAL
  Filled 2013-09-11: qty 1

## 2013-09-11 MED ORDER — BENZTROPINE MESYLATE 1 MG PO TABS: 2.0000 mg | ORAL_TABLET | Freq: Two times a day (BID) | ORAL | Status: DC

## 2013-09-11 MED ORDER — ONDANSETRON HCL 4 MG/2ML IJ SOLN
4.0000 mg | Freq: Once | INTRAMUSCULAR | Status: AC
Start: 2013-09-11 — End: 2013-09-11
  Administered 2013-09-11: 4 mg via INTRAVENOUS
  Filled 2013-09-11: qty 2

## 2013-09-11 MED ORDER — IOHEXOL 300 MG/ML  SOLN
80.0000 mL | Freq: Once | INTRAMUSCULAR | Status: AC | PRN
  Administered 2013-09-11: 80 mL via INTRAVENOUS

## 2013-09-11 MED ORDER — OMEPRAZOLE 20 MG PO CPDR: 20.0000 mg | DELAYED_RELEASE_CAPSULE | Freq: Every day | ORAL | Status: DC

## 2013-09-11 NOTE — ED Notes (Signed)
Pt transported to CT scan.

## 2013-09-11 NOTE — Discharge Instructions (Signed)
Cut the dose of you Coreg (Carvedilol) in half.  Follow up with your Cardiologist regarding you passing out.  Follow up with GI physician listed above regarding your nausea and vomiting.

## 2013-09-11 NOTE — ED Notes (Signed)
Pt states she feels a little better but is still nauseated and dizzy but not as bad for either one.

## 2013-09-11 NOTE — ED Notes (Signed)
Heather, PA back at the bedside.

## 2013-09-11 NOTE — ED Notes (Signed)
Per [facility], pt from home for ongoing N/V which has been for a week, and a syncopal episode. Pt was seen here on 8/28 for same and given 4 zofran tabs. Pt reports finished them Sunday. She had gotten up to go to the bathroom and then she remembers waking up in the bed with her daughter standing beside the bed. Denies any pain. 20g to LAC and given 200 ml NS. Reports feeling dizzy like the room is spinning and is worse with movement. Was given 4 mg zofran IV.

## 2013-09-11 NOTE — ED Notes (Signed)
Heather, PA at the bedside.  

## 2013-09-11 NOTE — ED Notes (Signed)
CT made aware pt finished drinking PO contrast. 

## 2013-09-11 NOTE — ED Provider Notes (Signed)
CSN: [id number]     Arrival date & time 09/11/13  0740 History   First MD Initiated Contact with Patient 09/11/13 [phone]     Chief Complaint  Patient presents with  . Nausea  . Emesis  . Loss of Consciousness     (Consider location/radiation/quality/duration/timing/severity/associated sxs/prior Treatment) HPI Comments: Patient presents today with a chief complaint of syncope.  She reports that this morning she was urinating.  When she got up from the toilet she felt dizzy and then loss consciousness and fell to the ground.  She does not remember falling.  Her daughter reports that she heard the patient fall and quickly checked on the patient.  When daughter found her the patient was lying on the ground, but quickly regained consciousness within seconds.  Patient reports that she has had several episodes of near syncope in the past that has been occurring intermittently over the past 3 years.  She states that she has been seen by Cardiology in the past.  Review of the chart shows that the patient has a history of large Pericardial effusion that was diagnosed three years ago.  Patient denies chest pain, SOB, focal weakness, headache, neck pain, or vision changes.  She reports that she has also had nausea and vomiting over the past week.  She states that she has been vomiting twice daily.  She states that she is unable to keep anything down.  She states that she was taking Zofran tablets, but did not feel that it was helping.  No blood in her emesis.  She denies diarrhea or abdominal pain.  No fever or chills.    The history is provided by the patient.    Past Medical History  Diagnosis Date  . Diabetes mellitus   . Hypertension   . Lack of adequate sleep   . Fatigue   . Full dentures   . Rash     on right breast  . Abdominal pain   . Hemorrhoids   . Wears glasses   . Constipation   . Arthritis   . Back pain   . Liver mass    Past Surgical History  Procedure Laterality Date  .  Cholecystectomy, laparoscopic  07/24/2010   Family History  Problem Relation Age of Onset  . Alcohol abuse Other   . Diabetes Other    History  Substance Use Topics  . Smoking status: Never Smoker   . Smokeless tobacco: Not on file  . Alcohol Use: No   OB History   Grav Para Term Preterm Abortions TAB SAB Ect Mult Living                 Review of Systems  All other systems reviewed and are negative.     Allergies  Aspirin; Fruit & vegetable daily; and Tylenol  Home Medications   Prior to Admission medications   Medication Sig Start Date End Date Taking? Authorizing Provider  benztropine (COGENTIN) 2 MG tablet Take 1 mg by mouth 2 (two) times daily.     Historical Provider, MD  carvedilol (COREG) 25 MG tablet Take 1 tablet (25 mg total) by mouth 2 (two) times daily. 02/23/11 02/23/12  Vesta Mixer, MD  carvedilol (COREG) 25 MG tablet Take 25 mg by mouth 2 (two) times daily with a meal.    Historical Provider, MD  diltiazem (CARDIZEM CD) 240 MG 24 hr capsule Take 240 mg by mouth daily.    Historical Provider, MD  glipiZIDE-metformin (METAGLIP) 5-500 MG  per tablet Take 1 tablet by mouth 2 (two) times daily before a meal.     Historical Provider, MD  hydrochlorothiazide (HYDRODIURIL) 12.5 MG tablet Take 12.5 mg by mouth daily.    Historical Provider, MD  olmesartan-hydrochlorothiazide (BENICAR HCT) 40-25 MG per tablet Take 1 tablet by mouth daily.    Historical Provider, MD  ondansetron (ZOFRAN ODT) 4 MG disintegrating tablet 4mg  ODT q8 hours prn nausea/vomit 09/07/13   Joya Gaskins, MD  oxyCODONE (ROXICODONE) 5 MG immediate release tablet Take 1 tablet (5 mg total) by mouth every 4 (four) hours as needed for severe pain. 09/07/13   Joya Gaskins, MD  pioglitazone (ACTOS) 15 MG tablet Take 15 mg by mouth daily.    Historical Provider, MD   BP 154/76  Temp(Src) 97 F (36.1 C) (Oral)  Resp 22  Ht 5\' 2"  (1.575 m)  Wt 192 lb (87.091 kg)  BMI 35.11 kg/m2  SpO2  100% Physical Exam  Nursing note and vitals reviewed. Constitutional: She appears well-developed and well-nourished.  HENT:  Head: Normocephalic and atraumatic.  Mouth/Throat: Oropharynx is clear and moist.  Eyes: EOM are normal. Pupils are equal, round, and reactive to light.  Neck: Normal range of motion. Neck supple.  Cardiovascular: Normal rate, regular rhythm and normal heart sounds.   Pulmonary/Chest: Effort normal and breath sounds normal.  Abdominal: Soft. Bowel sounds are normal. She exhibits no distension and no mass. There is tenderness. There is no rebound and no guarding.  Diffuse abdominal tenderness to palpation  Musculoskeletal: Normal range of motion.  Neurological: She is alert. She has normal strength. No cranial nerve deficit or sensory deficit. Gait normal.  Skin: Skin is warm and dry.  Psychiatric: She has a normal mood and affect.    ED Course  Procedures (including critical care time) Labs Review Labs Reviewed  CBC WITH DIFFERENTIAL  COMPREHENSIVE METABOLIC PANEL  LIPASE, BLOOD  I-STAT TROPOININ, ED    Imaging Review Dg Chest 2 View  09/11/2013   CLINICAL DATA:  Syncope.  EXAM: CHEST  2 VIEW  COMPARISON:  September 07, 2013.  FINDINGS: Stable mild cardiomegaly. Both lungs are clear. No pneumothorax or pleural effusion is noted. The visualized skeletal structures are unremarkable.  IMPRESSION: No acute cardiopulmonary abnormality seen.   Electronically Signed   By: Roque Lias M.D.   On: 09/11/2013 08:39   Ct Abdomen Pelvis W Contrast  09/11/2013   CLINICAL DATA:  Nausea vomiting and lower abdominal pain for 1 week  EXAM: CT ABDOMEN AND PELVIS WITH CONTRAST  TECHNIQUE: Multidetector CT imaging of the abdomen and pelvis was performed using the standard protocol following bolus administration of intravenous contrast.  CONTRAST:  80 cc of Omnipaque 300 intravenously ; the patient received a small amount of oral contrast.  COMPARISON:  CT scan of the abdomen and  pelvis dated March 31, 2010.  FINDINGS: The liver exhibits normal density with no evidence of residual or recurrent hepatic abscess. A trace of intrahepatic ductal dilation consistent with previous cholecystectomy is demonstrated. The pancreas, spleen, adrenal glands, and kidneys exhibit no acute abnormalities. There is stable hypodensity in the mid to lower pole of the right kidney consistent with a cyst. The caliber of the abdominal aorta is normal.  There is a small hiatal hernia versus thickened distal esophageal wall. The stomach, small bowel, and colon exhibit no evidence of ileus, obstruction, or acute inflammation. The appendix is normal. There are scattered sigmoid diverticula without objective evidence of acute diverticulitis.  The uterus is surgically absent. There are no adnexal masses. There is no free pelvic fluid. The urinary bladder is normal. The lumbar spine and bony pelvis are unremarkable.  There is atelectasis versus scarring in the posterior costophrenic gutter on the left. There is small pericardial effusion which is not new.  IMPRESSION: 1. There is no evidence of a recurrent hepatic abscess nor other acute hepatobiliary abnormality. 2. There is no evidence of enteritis or colitis or diverticulitis. There is either a small hiatal hernia or thickening of the distal esophagus which could reflect esophagitis. 3. There is no acute hepatobiliary abnormality. 4. There is a small pericardial effusion which stable. A previously demonstrated right pleural effusion has resolved. There is minimal left basilar atelectasis or scarring.   Electronically Signed   By: David  [place]   On: 09/11/2013 16:01     EKG Interpretation None     10:47 AM Reassessed patient.  Ambulated patient in the room.  No ataxia.  Patient reports that her dizziness and nausea has improved.  Will fluid challenge and reassess. 12:15 PM Patient able to tolerate PO liquids. MDM   Final diagnoses:  None   Patient  presents today after a syncopal episode.  Patient also complaining of nausea and vomiting.  She was found to be Orthostatic upon initial evaluation.  Patient given IVF and dizziness improved.  Normal neurological exam.  Patient also found to be bradycardic.  She is currently on Diltiazem, HCTZ, and Coreg.  Patient instructed to half her dose of Coreg and follow up with Cardiology.  No ischemic changes on EKG.  Troponin negative.  She is also complaining of nausea and vomiting.  Labs today unremarkable.  Nausea improved in the ED.  No vomiting during ED course.  CT ab/pelvis results as above showing possible Esophagitis.  Patient started on Omeprazole and given follow up with GI.  Patient stable for discharge.  Return precautions given.  Patient also evaluated by Dr. Patria Mane who is in agreement with the plan.      Santiago Glad, PA-C 09/12/13 2219

## 2013-09-15 NOTE — ED Provider Notes (Signed)
Medical screening examination/treatment/procedure(s) were performed by non-physician practitioner and as supervising physician I was immediately available for consultation/collaboration.   EKG Interpretation   Date/Time:  Tuesday September 11 2013 07:38:56 EDT Ventricular Rate:  54 PR Interval:  164 QRS Duration: 88 QT Interval:  499 QTC Calculation: 473 R Axis:   81 Text Interpretation:  Sinus rhythm Borderline right axis deviation Minimal  ST depression, diffuse leads No significant change was found Confirmed by  Avyon Herendeen  MD, Caryn Bee ([id number]) on 09/11/2013 10:36:05 AM Also confirmed by Patria Mane   MD, Caryn Bee ([id number])  on 09/11/2013 10:52:26 AM        Lyanne Co, MD 09/15/13 [phone]

## 2014-06-17 DIAGNOSIS — E119 Type 2 diabetes mellitus without complications: Secondary | ICD-10-CM | POA: Diagnosis not present

## 2014-08-02 ENCOUNTER — Emergency Department (HOSPITAL_COMMUNITY): Payer: Medicare Other

## 2014-08-02 ENCOUNTER — Emergency Department (HOSPITAL_COMMUNITY)
Admission: EM | Admit: 2014-08-02 | Discharge: 2014-08-02 | Disposition: A | Discharge status: Home / Self Care | Payer: Medicare Other | Attending: Emergency Medicine | Admitting: Emergency Medicine

## 2014-08-02 ENCOUNTER — Encounter (HOSPITAL_COMMUNITY): Payer: Self-pay | Admitting: Emergency Medicine

## 2014-08-02 DIAGNOSIS — M1990 Unspecified osteoarthritis, unspecified site: Secondary | ICD-10-CM | POA: Diagnosis not present

## 2014-08-02 DIAGNOSIS — I10 Essential (primary) hypertension: Secondary | ICD-10-CM | POA: Insufficient documentation

## 2014-08-02 DIAGNOSIS — M545 Low back pain: Secondary | ICD-10-CM | POA: Diagnosis not present

## 2014-08-02 DIAGNOSIS — R1031 Right lower quadrant pain: Secondary | ICD-10-CM | POA: Insufficient documentation

## 2014-08-02 DIAGNOSIS — Z79899 Other long term (current) drug therapy: Secondary | ICD-10-CM | POA: Insufficient documentation

## 2014-08-02 DIAGNOSIS — E119 Type 2 diabetes mellitus without complications: Secondary | ICD-10-CM | POA: Insufficient documentation

## 2014-08-02 DIAGNOSIS — R109 Unspecified abdominal pain: Secondary | ICD-10-CM | POA: Diagnosis present

## 2014-08-02 LAB — URINALYSIS, ROUTINE W REFLEX MICROSCOPIC
Bilirubin Urine: NEGATIVE
GLUCOSE, UA: NEGATIVE mg/dL
HGB URINE DIPSTICK: NEGATIVE
Ketones, ur: 15 mg/dL — AB
NITRITE: NEGATIVE
Protein, ur: NEGATIVE mg/dL
Specific Gravity, Urine: 1.015 (ref 1.005–1.030)
Urobilinogen, UA: 1 mg/dL (ref 0.0–1.0)
pH: 5.5 (ref 5.0–8.0)

## 2014-08-02 LAB — COMPREHENSIVE METABOLIC PANEL
ALK PHOS: 71 U/L (ref 38–126)
ALT: 12 U/L — AB (ref 14–54)
AST: 16 U/L (ref 15–41)
Albumin: 3.8 g/dL (ref 3.5–5.0)
Anion gap: 9 (ref 5–15)
BILIRUBIN TOTAL: 0.5 mg/dL (ref 0.3–1.2)
CALCIUM: 10 mg/dL (ref 8.9–10.3)
CO2: 27 mmol/L (ref 22–32)
Chloride: 99 mmol/L — ABNORMAL LOW (ref 101–111)
Creatinine, Ser: 0.81 mg/dL (ref 0.44–1.00)
GFR calc Af Amer: >60 mL/min (ref >60)
GFR calc non Af Amer: >60 mL/min (ref >60)
GLUCOSE: 195 mg/dL — AB (ref 65–99)
Potassium: 3.8 mmol/L (ref 3.5–5.1)
Sodium: 135 mmol/L (ref 135–145)
Total Protein: 8.3 g/dL — ABNORMAL HIGH (ref 6.5–8.1)

## 2014-08-02 LAB — CBC
HCT: 44 % (ref 36.0–46.0)
HEMOGLOBIN: 14.6 g/dL (ref 12.0–15.0)
MCH: 25.8 pg — ABNORMAL LOW (ref 26.0–34.0)
MCHC: 33.2 g/dL (ref 30.0–36.0)
MCV: 77.7 fL — ABNORMAL LOW (ref 78.0–100.0)
Platelets: 245 10*3/uL (ref 150–400)
RBC: 5.66 MIL/uL — ABNORMAL HIGH (ref 3.87–5.11)
RDW: 15.4 % (ref 11.5–15.5)
WBC: 6.3 10*3/uL (ref 4.0–10.5)

## 2014-08-02 LAB — LIPASE, BLOOD: Lipase: 18 U/L — ABNORMAL LOW (ref 22–51)

## 2014-08-02 LAB — CBG MONITORING, ED: GLUCOSE-CAPILLARY: 171 mg/dL — AB (ref 65–99)

## 2014-08-02 LAB — URINE MICROSCOPIC-ADD ON

## 2014-08-02 MED ORDER — SODIUM CHLORIDE 0.9 % IV SOLN
INTRAVENOUS | Status: DC
  Administered 2014-08-02: 13:00:00 via INTRAVENOUS

## 2014-08-02 MED ORDER — IOHEXOL 300 MG/ML  SOLN
100.0000 mL | Freq: Once | INTRAMUSCULAR | Status: AC | PRN
  Administered 2014-08-02: 100 mL via INTRAVENOUS

## 2014-08-02 MED ORDER — FENTANYL CITRATE (PF) 100 MCG/2ML IJ SOLN
100.0000 ug | INTRAMUSCULAR | Status: DC | PRN
  Administered 2014-08-02 (×2): 100 ug via INTRAVENOUS
  Filled 2014-08-02 (×2): qty 2

## 2014-08-02 MED ORDER — SODIUM CHLORIDE 0.9 % IV BOLUS (SEPSIS)
500.0000 mL | Freq: Once | INTRAVENOUS | Status: AC
  Administered 2014-08-02: 500 mL via INTRAVENOUS

## 2014-08-02 MED ORDER — ONDANSETRON HCL 8 MG PO TABS: 8.0000 mg | ORAL_TABLET | Freq: Three times a day (TID) | ORAL | Status: DC | PRN

## 2014-08-02 MED ORDER — IOHEXOL 300 MG/ML  SOLN
25.0000 mL | Freq: Once | INTRAMUSCULAR | Status: AC | PRN
  Administered 2014-08-02: 25 mL via ORAL

## 2014-08-02 MED ORDER — ONDANSETRON HCL 4 MG/2ML IJ SOLN
4.0000 mg | Freq: Once | INTRAMUSCULAR | Status: AC
  Administered 2014-08-02: 4 mg via INTRAVENOUS
  Filled 2014-08-02: qty 2

## 2014-08-02 MED ORDER — TRAMADOL HCL 50 MG PO TABS: 50.0000 mg | ORAL_TABLET | Freq: Four times a day (QID) | ORAL | Status: DC | PRN

## 2014-08-02 NOTE — ED Notes (Signed)
Patient transported to CT 

## 2014-08-02 NOTE — ED Notes (Signed)
Pt. Stated, [name] [name] been sick since Monday with abdominal pain and back pain, sometimes I feel like Im going to pass out.

## 2014-08-02 NOTE — ED Provider Notes (Signed)
CSN: [id number]     Arrival date & time 08/02/14  1031 History   First MD Initiated Contact with Patient 08/02/14 1107     Chief Complaint  Patient presents with  . Abdominal Pain  . Back Pain     (Consider location/radiation/quality/duration/timing/severity/associated sxs/prior Treatment) HPI   [name] [name] is a 64 y.o. female who presents for evaluation of right-sided abdominal pain. The pain is intermittent. She denies fever, nausea or vomiting. She had a bowel movement yesterday. She is not having dysuria or vomiting. She has not had this previously. She is taking her usual medications. There are no other known modifying factors.   Past Medical History  Diagnosis Date  . Diabetes mellitus   . Hypertension   . Lack of adequate sleep   . Fatigue   . Full dentures   . Rash     on right breast  . Abdominal pain   . Hemorrhoids   . Wears glasses   . Constipation   . Arthritis   . Back pain   . Liver mass    Past Surgical History  Procedure Laterality Date  . Cholecystectomy, laparoscopic  07/24/2010   Family History  Problem Relation Age of Onset  . Alcohol abuse Other   . Diabetes Other    History  Substance Use Topics  . Smoking status: Never Smoker   . Smokeless tobacco: Not on file  . Alcohol Use: No   OB History    No data available     Review of Systems  All other systems reviewed and are negative.     Allergies  Aspirin; Fruit & vegetable daily; and Tylenol  Home Medications   Prior to Admission medications   Medication Sig Start Date End Date Taking? Authorizing Provider  benztropine (COGENTIN) 1 MG tablet Take 2 tablets (2 mg total) by mouth 2 (two) times daily. 09/11/13   Heather Laisure, PA-C  carvedilol (COREG) 25 MG tablet Take 25 mg by mouth 2 (two) times daily with a meal.    Historical Provider, MD  diltiazem (CARDIZEM CD) 240 MG 24 hr capsule Take 240 mg by mouth daily.    Historical Provider, MD  hydrochlorothiazide (HYDRODIURIL)  12.5 MG tablet Take 12.5 mg by mouth daily.    Historical Provider, MD  metFORMIN (GLUCOPHAGE) 1000 MG tablet Take 1,000 mg by mouth 2 (two) times daily. 08/25/13   Historical Provider, MD  omeprazole (PRILOSEC) 20 MG capsule Take 1 capsule (20 mg total) by mouth daily. 09/11/13   Heather Laisure, PA-C  ondansetron (ZOFRAN) 8 MG tablet Take 1 tablet (8 mg total) by mouth every 8 (eight) hours as needed for nausea or vomiting. 08/02/14   Mancel Bale, MD  pioglitazone (ACTOS) 15 MG tablet Take 15 mg by mouth daily.    Historical Provider, MD  promethazine (PHENERGAN) 25 MG tablet Take 1 tablet (25 mg total) by mouth every 6 (six) hours as needed for nausea or vomiting. 09/11/13   Heather Laisure, PA-C  traMADol (ULTRAM) 50 MG tablet Take 1 tablet (50 mg total) by mouth every 6 (six) hours as needed. 08/02/14   Mancel Bale, MD   BP 178/72 mmHg  Pulse 61  Temp(Src) 97.9 F (36.6 C) (Oral)  Resp 18  SpO2 100% Physical Exam  Constitutional: She is oriented to person, place, and time. She appears well-developed and well-nourished.  HENT:  Head: Normocephalic and atraumatic.  Right Ear: External ear normal.  Left Ear: External ear normal.  Eyes: Conjunctivae  and EOM are normal. Pupils are equal, round, and reactive to light.  Neck: Normal range of motion and phonation normal. Neck supple.  Cardiovascular: Normal rate, regular rhythm and normal heart sounds.   Pulmonary/Chest: Effort normal and breath sounds normal. No respiratory distress. She exhibits no bony tenderness.  Abdominal: Soft. There is tenderness (Right upper and lower abdominal tenderness, mild).  Musculoskeletal: Normal range of motion.  Neurological: She is alert and oriented to person, place, and time. No cranial nerve deficit or sensory deficit. She exhibits normal muscle tone. Coordination normal.  Skin: Skin is warm, dry and intact.  Psychiatric: She has a normal mood and affect. Her behavior is normal. Judgment and thought  content normal.  Nursing note and vitals reviewed.   ED Course  Procedures (including critical care time)  Medications  fentaNYL (SUBLIMAZE) injection 100 mcg (100 mcg Intravenous Given 08/02/14 1235)  0.9 %  sodium chloride infusion ( Intravenous New Bag/Given 08/02/14 1234)  ondansetron (ZOFRAN) injection 4 mg (4 mg Intravenous Given 08/02/14 1234)  sodium chloride 0.9 % bolus 500 mL (0 mLs Intravenous Stopped 08/02/14 1335)  iohexol (OMNIPAQUE) 300 MG/ML solution 25 mL (25 mLs Oral Contrast Given 08/02/14 1306)  iohexol (OMNIPAQUE) 300 MG/ML solution 100 mL (100 mLs Intravenous Contrast Given 08/02/14 1342)    Patient Vitals for the past 24 hrs:  BP Temp Temp src Pulse Resp SpO2  08/02/14 1427 178/72 mmHg - - 61 18 100 %  08/02/14 1415 178/72 mmHg - - 73 - 96 %  08/02/14 1330 161/75 mmHg - - (!) 59 - 100 %  08/02/14 1329 159/99 mmHg - - 60 18 100 %  08/02/14 1230 146/76 mmHg - - 87 - 100 %  08/02/14 1203 147/75 mmHg - - (!) 58 18 99 %  08/02/14 1130 168/87 mmHg - - 68 - 100 %  08/02/14 1043 152/80 mmHg 97.9 F (36.6 C) Oral 64 16 100 %    4:11 PM Reevaluation with update and discussion. After initial assessment and treatment, an updated evaluation reveals she is comfortable at this time. She has mild persistent low back pain, and nausea. Findings discussed with patient and family members, all questions were answered.Mancel Bale L     Labs Review Labs Reviewed  LIPASE, BLOOD - Abnormal; Notable for the following:    Lipase 18 (*)    All other components within normal limits  COMPREHENSIVE METABOLIC PANEL - Abnormal; Notable for the following:    Chloride 99 (*)    Glucose, Bld 195 (*)    BUN <5 (*)    Total Protein 8.3 (*)    ALT 12 (*)    All other components within normal limits  CBC - Abnormal; Notable for the following:    RBC 5.66 (*)    MCV 77.7 (*)    MCH 25.8 (*)    All other components within normal limits  URINALYSIS, ROUTINE W REFLEX MICROSCOPIC (NOT AT  [facility]) - Abnormal; Notable for the following:    APPearance HAZY (*)    Ketones, ur 15 (*)    Leukocytes, UA LARGE (*)    All other components within normal limits  CBG MONITORING, ED - Abnormal; Notable for the following:    Glucose-Capillary 171 (*)    All other components within normal limits  URINE CULTURE  URINE MICROSCOPIC-ADD ON    Imaging Review Ct Abdomen Pelvis W Contrast  08/02/2014   CLINICAL DATA:  Right-sided abdominal pain radiating to the pelvis and back.  Vomiting.  EXAM: CT ABDOMEN AND PELVIS WITH CONTRAST  TECHNIQUE: Multidetector CT imaging of the abdomen and pelvis was performed using the standard protocol following bolus administration of intravenous contrast.  CONTRAST:  OMNIPAQUE IOHEXOL 300 MG/ML  SOLN  COMPARISON:  09/11/2013  FINDINGS: Lower chest: Motion degradation of imaging through the lung bases is noted. Minimal dependent left basilar atelectasis. Trace left pleural fluid or thickening. Heart size at upper limits of normal with trace pericardial fluid.  Hepatobiliary: Cholecystectomy clips are noted. Mild fusiform prominence of the mid common duct measuring 1.1 cm image 23 is reidentified, with tapering to the ampulla. No focal hepatic abnormality.  Pancreas: Normal  Spleen: Normal  Adrenals/Urinary Tract: Adrenal glands are normal. Bilateral too small to characterize renal cortical hypodensities are noted. 7 mm right mid renal cortical cyst. No hydroureteronephrosis. No perinephric fluid or stranding.  Stomach/Bowel: Appendix is normal. A few colonic tics are identified without evidence for diverticulitis. No bowel wall thickening or focal segmental dilatation is identified. Stomach appears normal.  Vascular/Lymphatic: Mild atheromatous aortic calcification without aneurysm. No lymphadenopathy.  Other: Uterus surgically absent. Ovaries appear normal. No free air or fluid.  Musculoskeletal: No acute osseous abnormality or lytic or sclerotic osseous lesion.   IMPRESSION: No acute intra-abdominal or pelvic pathology.   Electronically Signed   By: Christiana Pellant M.D.   On: 08/02/2014 14:24     EKG Interpretation None      MDM   Final diagnoses:  Right lower quadrant abdominal pain    Nonspecific abdominal pain, nausea and back pain. No evidence for acute appendicitis, colitis, bowel obstruction, UTI or serious bacterial infection.  Nursing Notes Reviewed/ Care Coordinated Applicable Imaging Reviewed Interpretation of Laboratory Data incorporated into ED treatment  The patient appears reasonably screened and/or stabilized for discharge and I doubt any other medical condition or other [facility] requiring further screening, evaluation, or treatment in the ED at this time prior to discharge.  Plan: Home Medications- Zofran, Tramadol; Home Treatments- rest; return here if the recommended treatment, does not improve the symptoms; Recommended follow up- PCP prn      Mancel Bale, MD 08/02/14 [phone]

## 2014-08-02 NOTE — ED Notes (Signed)
Pt from home for eval of right sided abd pain with radiation to pelvic region and back since Monday, pt reports emesis on Monday and unable to keep any food or liquids down but "I got better on Tuesday and then started feeling bad again." Pt denies any urinary symptoms at this time and reports constant nausea. nad noted.

## 2014-08-02 NOTE — Discharge Instructions (Signed)

## 2014-08-03 LAB — URINE CULTURE: Special Requests: NORMAL

## 2014-08-29 ENCOUNTER — Other Ambulatory Visit: Payer: Self-pay

## 2014-08-29 DIAGNOSIS — E1165 Type 2 diabetes mellitus with hyperglycemia: Secondary | ICD-10-CM | POA: Diagnosis not present

## 2014-08-29 DIAGNOSIS — Z1231 Encounter for screening mammogram for malignant neoplasm of breast: Secondary | ICD-10-CM

## 2014-08-29 DIAGNOSIS — I10 Essential (primary) hypertension: Secondary | ICD-10-CM | POA: Diagnosis not present

## 2014-08-29 DIAGNOSIS — Z79899 Other long term (current) drug therapy: Secondary | ICD-10-CM | POA: Diagnosis not present

## 2014-08-29 DIAGNOSIS — Z1239 Encounter for other screening for malignant neoplasm of breast: Secondary | ICD-10-CM | POA: Diagnosis not present

## 2014-09-03 ENCOUNTER — Ambulatory Visit
Admission: RE | Admit: 2014-09-03 | Discharge: 2014-09-03 | Disposition: A | Discharge status: Home / Self Care | Payer: Medicaid Other | Source: Ambulatory Visit

## 2014-09-03 DIAGNOSIS — Z1231 Encounter for screening mammogram for malignant neoplasm of breast: Secondary | ICD-10-CM | POA: Diagnosis not present

## 2014-09-09 DIAGNOSIS — I313 Pericardial effusion (noninflammatory): Secondary | ICD-10-CM | POA: Diagnosis not present

## 2014-09-09 DIAGNOSIS — I10 Essential (primary) hypertension: Secondary | ICD-10-CM | POA: Diagnosis not present

## 2014-09-09 DIAGNOSIS — R55 Syncope and collapse: Secondary | ICD-10-CM | POA: Diagnosis not present

## 2014-09-09 DIAGNOSIS — Z6831 Body mass index (BMI) 31.0-31.9, adult: Secondary | ICD-10-CM | POA: Diagnosis not present

## 2014-10-09 ENCOUNTER — Encounter (HOSPITAL_COMMUNITY): Payer: Self-pay | Admitting: *Deleted

## 2014-10-09 ENCOUNTER — Emergency Department (HOSPITAL_COMMUNITY)
Admission: EM | Admit: 2014-10-09 | Discharge: 2014-10-09 | Disposition: A | Discharge status: Home / Self Care | Payer: Medicare Other | Attending: Emergency Medicine | Admitting: Emergency Medicine

## 2014-10-09 ENCOUNTER — Emergency Department (HOSPITAL_COMMUNITY): Payer: Medicare Other

## 2014-10-09 DIAGNOSIS — Z8719 Personal history of other diseases of the digestive system: Secondary | ICD-10-CM | POA: Diagnosis not present

## 2014-10-09 DIAGNOSIS — R110 Nausea: Secondary | ICD-10-CM | POA: Diagnosis not present

## 2014-10-09 DIAGNOSIS — R197 Diarrhea, unspecified: Secondary | ICD-10-CM | POA: Diagnosis not present

## 2014-10-09 DIAGNOSIS — R1084 Generalized abdominal pain: Secondary | ICD-10-CM | POA: Diagnosis not present

## 2014-10-09 DIAGNOSIS — R1010 Upper abdominal pain, unspecified: Secondary | ICD-10-CM | POA: Insufficient documentation

## 2014-10-09 DIAGNOSIS — R112 Nausea with vomiting, unspecified: Secondary | ICD-10-CM | POA: Insufficient documentation

## 2014-10-09 DIAGNOSIS — I10 Essential (primary) hypertension: Secondary | ICD-10-CM | POA: Diagnosis not present

## 2014-10-09 DIAGNOSIS — R109 Unspecified abdominal pain: Secondary | ICD-10-CM | POA: Diagnosis not present

## 2014-10-09 DIAGNOSIS — R102 Pelvic and perineal pain: Secondary | ICD-10-CM | POA: Diagnosis not present

## 2014-10-09 DIAGNOSIS — E119 Type 2 diabetes mellitus without complications: Secondary | ICD-10-CM | POA: Insufficient documentation

## 2014-10-09 DIAGNOSIS — Z9049 Acquired absence of other specified parts of digestive tract: Secondary | ICD-10-CM | POA: Insufficient documentation

## 2014-10-09 DIAGNOSIS — Z79899 Other long term (current) drug therapy: Secondary | ICD-10-CM | POA: Diagnosis not present

## 2014-10-09 DIAGNOSIS — R1030 Lower abdominal pain, unspecified: Secondary | ICD-10-CM | POA: Insufficient documentation

## 2014-10-09 LAB — COMPREHENSIVE METABOLIC PANEL
ALK PHOS: 65 U/L (ref 38–126)
ALT: 9 U/L — ABNORMAL LOW (ref 14–54)
ANION GAP: 10 (ref 5–15)
AST: 30 U/L (ref 15–41)
Albumin: 3.6 g/dL (ref 3.5–5.0)
BUN: 8 mg/dL (ref 6–20)
CALCIUM: 9.4 mg/dL (ref 8.9–10.3)
CO2: 30 mmol/L (ref 22–32)
CREATININE: 0.8 mg/dL (ref 0.44–1.00)
Chloride: 98 mmol/L — ABNORMAL LOW (ref 101–111)
Glucose, Bld: 170 mg/dL — ABNORMAL HIGH (ref 65–99)
Potassium: 4.9 mmol/L (ref 3.5–5.1)
SODIUM: 138 mmol/L (ref 135–145)
TOTAL PROTEIN: 7.9 g/dL (ref 6.5–8.1)
Total Bilirubin: 1.3 mg/dL — ABNORMAL HIGH (ref 0.3–1.2)

## 2014-10-09 LAB — CBC WITH DIFFERENTIAL/PLATELET
BASOS ABS: 0 10*3/uL (ref 0.0–0.1)
Basophils Relative: 0 %
EOS ABS: 0 10*3/uL (ref 0.0–0.7)
Eosinophils Relative: 1 %
HEMATOCRIT: 40.9 % (ref 36.0–46.0)
HEMOGLOBIN: 13.3 g/dL (ref 12.0–15.0)
Lymphocytes Relative: 26 %
Lymphs Abs: 1.6 10*3/uL (ref 0.7–4.0)
MCH: 25.3 pg — ABNORMAL LOW (ref 26.0–34.0)
MCHC: 32.5 g/dL (ref 30.0–36.0)
MCV: 77.8 fL — ABNORMAL LOW (ref 78.0–100.0)
MONOS PCT: 6 %
Monocytes Absolute: 0.3 10*3/uL (ref 0.1–1.0)
NEUTROS ABS: 4.1 10*3/uL (ref 1.7–7.7)
NEUTROS PCT: 67 %
Platelets: 277 10*3/uL (ref 150–400)
RBC: 5.26 MIL/uL — AB (ref 3.87–5.11)
RDW: 14.9 % (ref 11.5–15.5)
WBC: 6.1 10*3/uL (ref 4.0–10.5)

## 2014-10-09 LAB — URINALYSIS, ROUTINE W REFLEX MICROSCOPIC
Bilirubin Urine: NEGATIVE
Glucose, UA: NEGATIVE mg/dL
Hgb urine dipstick: NEGATIVE
Ketones, ur: NEGATIVE mg/dL
Leukocytes, UA: NEGATIVE
NITRITE: NEGATIVE
Protein, ur: NEGATIVE mg/dL
SPECIFIC GRAVITY, URINE: 1.008 (ref 1.005–1.030)
Urobilinogen, UA: 1 mg/dL (ref 0.0–1.0)
pH: 6.5 (ref 5.0–8.0)

## 2014-10-09 LAB — LIPASE, BLOOD: Lipase: 20 U/L — ABNORMAL LOW (ref 22–51)

## 2014-10-09 MED ORDER — HYDROMORPHONE HCL 1 MG/ML IJ SOLN
1.0000 mg | Freq: Once | INTRAMUSCULAR | Status: DC
  Filled 2014-10-09: qty 1

## 2014-10-09 MED ORDER — SODIUM CHLORIDE 0.9 % IV BOLUS (SEPSIS)
1000.0000 mL | Freq: Once | INTRAVENOUS | Status: AC
  Administered 2014-10-09: 1000 mL via INTRAVENOUS

## 2014-10-09 MED ORDER — ONDANSETRON HCL 4 MG/2ML IJ SOLN
4.0000 mg | Freq: Once | INTRAMUSCULAR | Status: AC
  Administered 2014-10-09: 4 mg via INTRAVENOUS
  Filled 2014-10-09: qty 2

## 2014-10-09 MED ORDER — MORPHINE SULFATE (PF) 4 MG/ML IV SOLN
4.0000 mg | Freq: Once | INTRAVENOUS | Status: AC
  Administered 2014-10-09: 4 mg via INTRAVENOUS
  Filled 2014-10-09: qty 1

## 2014-10-09 MED ORDER — IOHEXOL 300 MG/ML  SOLN
100.0000 mL | Freq: Once | INTRAMUSCULAR | Status: AC | PRN
Start: 2014-10-09 — End: 2014-10-09
  Administered 2014-10-09: 100 mL via INTRAVENOUS

## 2014-10-09 NOTE — ED Notes (Signed)
Patient sent here from her doctors office today.  Has had severe sharp abdominal pain, NVD x 3 days, body aches, pt is diabetic

## 2014-10-09 NOTE — Discharge Instructions (Signed)

## 2014-10-09 NOTE — ED Notes (Signed)
Bed: WA02 Expected date:  Expected time:  Means of arrival:  Comments: Ems- n/v/d

## 2014-10-09 NOTE — ED Notes (Signed)
Pt aware of need for urine specimen. Feels dehydrated at this time.  States she will try when she has received more of her IV fluids  Will call out when she feels like she can go.

## 2014-10-09 NOTE — ED Provider Notes (Signed)
CSN: [id number]     Arrival date & time 10/09/14  1147 History   First MD Initiated Contact with Patient 10/09/14 1220     Chief Complaint  Patient presents with  . Abdominal Pain  . Nausea     (Consider location/radiation/quality/duration/timing/severity/associated sxs/prior Treatment) HPI  64 year old female presents with a chief complaint of abdominal pain, nausea, vomiting, and diarrhea for the past 2 days. Started with diarrhea 2 days ago and then became vomiting with upper greater than lower abdominal pain. Has had similar episodes to this in the past with negative CT scans. Denies fevers. No hematemesis or bloody stools. No urinary symptoms. Patient rates her pain as severe. Patient is also having back pain with this.  Past Medical History  Diagnosis Date  . Diabetes mellitus   . Hypertension   . Lack of adequate sleep   . Fatigue   . Full dentures   . Rash     on right breast  . Abdominal pain   . Hemorrhoids   . Wears glasses   . Constipation   . Arthritis   . Back pain   . Liver mass    Past Surgical History  Procedure Laterality Date  . Cholecystectomy, laparoscopic  07/24/2010   Family History  Problem Relation Age of Onset  . Alcohol abuse Other   . Diabetes Other    Social History  Substance Use Topics  . Smoking status: Never Smoker   . Smokeless tobacco: None  . Alcohol Use: No   OB History    No data available     Review of Systems  Constitutional: Negative for fever.  Gastrointestinal: Positive for nausea, vomiting, abdominal pain and diarrhea. Negative for blood in stool.  Genitourinary: Negative for dysuria.  Musculoskeletal: Positive for back pain.  All other systems reviewed and are negative.     Allergies  Aspirin; Fruit & vegetable daily; and Tylenol  Home Medications   Prior to Admission medications   Medication Sig Start Date End Date Taking? Authorizing Provider  benztropine (COGENTIN) 1 MG tablet Take 2 tablets (2 mg  total) by mouth 2 (two) times daily. 09/11/13  Yes Heather Laisure, PA-C  carvedilol (COREG) 25 MG tablet Take 25 mg by mouth 2 (two) times daily with a meal.   Yes Historical Provider, MD  diltiazem (CARDIZEM CD) 240 MG 24 hr capsule Take 240 mg by mouth daily.   Yes Historical Provider, MD  metFORMIN (GLUCOPHAGE) 1000 MG tablet Take 1,000 mg by mouth 2 (two) times daily. 08/25/13  Yes Historical Provider, MD  omeprazole (PRILOSEC) 40 MG capsule Take 40 mg by mouth daily. 10/08/14  Yes Historical Provider, MD  ondansetron (ZOFRAN) 8 MG tablet Take 1 tablet (8 mg total) by mouth every 8 (eight) hours as needed for nausea or vomiting. 08/02/14  Yes Mancel Bale, MD  pioglitazone (ACTOS) 15 MG tablet Take 15 mg by mouth daily.   Yes Historical Provider, MD  triamterene-hydrochlorothiazide (MAXZIDE-25) 37.5-25 MG tablet Take 0.5 tablets by mouth daily. 08/29/14  Yes Historical Provider, MD  omeprazole (PRILOSEC) 20 MG capsule Take 1 capsule (20 mg total) by mouth daily. Patient not taking: Reported on 10/09/2014 09/11/13   Santiago Glad, PA-C  promethazine (PHENERGAN) 25 MG tablet Take 1 tablet (25 mg total) by mouth every 6 (six) hours as needed for nausea or vomiting. Patient not taking: Reported on 10/09/2014 09/11/13   Santiago Glad, PA-C  traMADol (ULTRAM) 50 MG tablet Take 1 tablet (50 mg total) by mouth  every 6 (six) hours as needed. Patient not taking: Reported on 10/09/2014 08/02/14   Mancel Bale, MD   BP 176/83 mmHg  Pulse 66  Temp(Src) 98.6 F (37 C) (Oral)  Resp 18  SpO2 100% Physical Exam  Constitutional: She is oriented to person, place, and time. She appears well-developed and well-nourished.  HENT:  Head: Normocephalic and atraumatic.  Right Ear: External ear normal.  Left Ear: External ear normal.  Nose: Nose normal.  Eyes: Right eye exhibits no discharge. Left eye exhibits no discharge.  Cardiovascular: Normal rate, regular rhythm and normal heart sounds.   Pulmonary/Chest:  Effort normal and breath sounds normal.  Abdominal: Soft. There is tenderness (upper > lower abdominal pain).  Neurological: She is alert and oriented to person, place, and time.  Skin: Skin is warm and dry.  Nursing note and vitals reviewed.   ED Course  Procedures (including critical care time) Labs Review Labs Reviewed  LIPASE, BLOOD - Abnormal; Notable for the following:    Lipase 20 (*)    All other components within normal limits  COMPREHENSIVE METABOLIC PANEL - Abnormal; Notable for the following:    Chloride 98 (*)    Glucose, Bld 170 (*)    ALT 9 (*)    Total Bilirubin 1.3 (*)    All other components within normal limits  CBC WITH DIFFERENTIAL/PLATELET - Abnormal; Notable for the following:    RBC 5.26 (*)    MCV 77.8 (*)    MCH 25.3 (*)    All other components within normal limits  URINALYSIS, ROUTINE W REFLEX MICROSCOPIC (NOT AT [facility]) - Abnormal; Notable for the following:    APPearance CLOUDY (*)    All other components within normal limits    Imaging Review US Abdomen Complete  10/09/2014   CLINICAL DATA:  Three days of nausea vomiting with abdominal and back pain, history of previous cholecystectomy, history of hypertension and diabetes.  EXAM: ULTRASOUND ABDOMEN COMPLETE  COMPARISON:  Abdominal and pelvic CT scan of August 02, 2014  FINDINGS: Gallbladder: The gallbladder is surgically absent.  Common bile duct: Diameter: 9.1 mm. No abnormal intraluminal echoes were demonstrated. The patient was quite tender to palpation during interrogation of the right upper quadrant for evaluation of the common bile duct.  Liver: The liver exhibits normal echotexture. There is no focal mass nor ductal dilation.  IVC: No abnormality visualized.  Pancreas: Visualized portion unremarkable.  Spleen: Size and appearance within normal limits.  Right Kidney: Length: 12.0 cm. The renal cortical echotexture is normal. There is a midpole cyst measuring 1 cm in diameter. There is no  hydronephrosis.  Left Kidney: Length: 10.5 cm. Echogenicity within normal limits. No mass or hydronephrosis visualized.  Abdominal aorta: No aneurysm visualized.  Other findings: There is no ascites.  IMPRESSION: 1. Mild dilation of the common bile duct not unusual in the post cholecystectomy state. However, there was marked tenderness in the right upper quadrant of the abdomen. No obvious abnormality in the gallbladder fossa or within the liver was observed. Further evaluation of the abdomen with CT scanning would be useful. 2. No acute abnormality demonstrated elsewhere within the abdomen.   Electronically Signed   By: David  [place] M.D.   On: 10/09/2014 16:47   I have personally reviewed and evaluated these images and lab results as part of my medical decision-making.   EKG Interpretation None      MDM   Final diagnoses:  None    Ultrasound ordered due to  patient's upper abdominal pain with repeatedly negative CT scans and mild bilirubin elevation. Ultrasound shows mild CBD dilation but no other specific findings. However they recommended CT given the nonspecific findings. Plan to get CT scan, if negative discharge home with pain and nausea medicine as well as follow-up with her gastroenterologist. Care transferred to Dr. Adela Lank with CT pending.    Pricilla Loveless, MD 10/09/14 [phone]

## 2014-10-09 NOTE — ED Notes (Signed)
Patient transported to CT 

## 2014-10-09 NOTE — ED Notes (Signed)
Per patient request the nurse is going to try and pull blood off patient IV

## 2014-10-11 DIAGNOSIS — M545 Low back pain: Secondary | ICD-10-CM | POA: Diagnosis not present

## 2014-10-11 DIAGNOSIS — R112 Nausea with vomiting, unspecified: Secondary | ICD-10-CM | POA: Diagnosis not present

## 2014-10-11 DIAGNOSIS — R109 Unspecified abdominal pain: Secondary | ICD-10-CM | POA: Diagnosis not present

## 2014-10-11 DIAGNOSIS — R1310 Dysphagia, unspecified: Secondary | ICD-10-CM | POA: Diagnosis not present

## 2014-10-25 ENCOUNTER — Ambulatory Visit
Admission: RE | Admit: 2014-10-25 | Discharge: 2014-10-25 | Disposition: A | Discharge status: Home / Self Care | Payer: Medicare Other | Source: Ambulatory Visit | Attending: Internal Medicine | Admitting: Internal Medicine

## 2014-10-25 ENCOUNTER — Other Ambulatory Visit: Payer: Self-pay | Admitting: Nurse Practitioner

## 2014-10-25 DIAGNOSIS — M545 Low back pain: Secondary | ICD-10-CM | POA: Diagnosis not present

## 2014-10-25 DIAGNOSIS — I10 Essential (primary) hypertension: Secondary | ICD-10-CM | POA: Diagnosis not present

## 2014-10-25 DIAGNOSIS — M47816 Spondylosis without myelopathy or radiculopathy, lumbar region: Secondary | ICD-10-CM | POA: Diagnosis not present

## 2014-10-25 DIAGNOSIS — E1165 Type 2 diabetes mellitus with hyperglycemia: Secondary | ICD-10-CM | POA: Diagnosis not present

## 2014-10-30 DIAGNOSIS — K219 Gastro-esophageal reflux disease without esophagitis: Secondary | ICD-10-CM | POA: Diagnosis not present

## 2014-10-30 DIAGNOSIS — K222 Esophageal obstruction: Secondary | ICD-10-CM | POA: Diagnosis not present

## 2014-10-30 DIAGNOSIS — R1310 Dysphagia, unspecified: Secondary | ICD-10-CM | POA: Diagnosis not present

## 2014-11-07 DIAGNOSIS — K222 Esophageal obstruction: Secondary | ICD-10-CM | POA: Diagnosis not present

## 2014-11-07 DIAGNOSIS — B3781 Candidal esophagitis: Secondary | ICD-10-CM | POA: Diagnosis not present

## 2014-11-07 DIAGNOSIS — R1310 Dysphagia, unspecified: Secondary | ICD-10-CM | POA: Diagnosis not present

## 2014-11-15 ENCOUNTER — Encounter: Payer: Self-pay | Admitting: Neurology

## 2014-11-15 ENCOUNTER — Ambulatory Visit (INDEPENDENT_AMBULATORY_CARE_PROVIDER_SITE_OTHER): Payer: Medicare Other | Admitting: Neurology

## 2014-11-15 VITALS — BP 144/93 | HR 93 | Ht 62.0 in | Wt 171.0 lb

## 2014-11-15 DIAGNOSIS — M549 Dorsalgia, unspecified: Secondary | ICD-10-CM

## 2014-11-15 HISTORY — DX: Dorsalgia, unspecified: M54.9

## 2014-11-15 MED ORDER — GABAPENTIN 300 MG PO CAPS: ORAL_CAPSULE | ORAL | Status: DC

## 2014-11-15 NOTE — Patient Instructions (Addendum)
We will get physical therapy and start a medication called gabapentin for the pain. We will get MRI of the low back.   Back Pain, Adult Back pain is very common in adults.The cause of back pain is rarely dangerous and the pain often gets better over time.The cause of your back pain may not be known. Some common causes of back pain include:  Strain of the muscles or ligaments supporting the spine.  Wear and tear (degeneration) of the spinal disks.  Arthritis.  Direct injury to the back. For many people, back pain may return. Since back pain is rarely dangerous, most people can learn to manage this condition on their own. HOME CARE INSTRUCTIONS Watch your back pain for any changes. The following actions may help to lessen any discomfort you are feeling:  Remain active. It is stressful on your back to sit or stand in one place for long periods of time. Do not sit, drive, or stand in one place for more than 30 minutes at a time. Take short walks on even surfaces as soon as you are able.Try to increase the length of time you walk each day.  Exercise regularly as directed by your health care provider. Exercise helps your back heal faster. It also helps avoid future injury by keeping your muscles strong and flexible.  Do not stay in bed.Resting more than 1-2 days can delay your recovery.  Pay attention to your body when you bend and lift. The most comfortable positions are those that put less stress on your recovering back. Always use proper lifting techniques, including:  Bending your knees.  Keeping the load close to your body.  Avoiding twisting.  Find a comfortable position to sleep. Use a firm mattress and lie on your side with your knees slightly bent. If you lie on your back, put a pillow under your knees.  Avoid feeling anxious or stressed.Stress increases muscle tension and can worsen back pain.It is important to recognize when you are anxious or stressed and learn ways to  manage it, such as with exercise.  Take medicines only as directed by your health care provider. Over-the-counter medicines to reduce pain and inflammation are often the most helpful.Your health care provider may prescribe muscle relaxant drugs.These medicines help dull your pain so you can more quickly return to your normal activities and healthy exercise.  Apply ice to the injured area:  Put ice in a plastic bag.  Place a towel between your skin and the bag.  Leave the ice on for 20 minutes, 2-3 times a day for the first 2-3 days. After that, ice and heat may be alternated to reduce pain and spasms.  Maintain a healthy weight. Excess weight puts extra stress on your back and makes it difficult to maintain good posture. SEEK MEDICAL CARE IF:  You have pain that is not relieved with rest or medicine.  You have increasing pain going down into the legs or buttocks.  You have pain that does not improve in one week.  You have night pain.  You lose weight.  You have a fever or chills. SEEK IMMEDIATE MEDICAL CARE IF:   You develop new bowel or bladder control problems.  You have unusual weakness or numbness in your arms or legs.  You develop nausea or vomiting.  You develop abdominal pain.  You feel faint.   This information is not intended to replace advice given to you by your health care provider. Make sure you discuss any questions  you have with your health care provider.   Document Released: 12/28/2004 Document Revised: 01/18/2014 Document Reviewed: 05/01/2013 Elsevier Interactive Patient Education [employer].

## 2014-11-15 NOTE — Progress Notes (Signed)
Reason for visit: Back pain  Referring physician: Dr. Jonnie Finner [name] [name] is a 64 y.o. female  History of present illness:  [name] [name] is a 64 year old right-handed white female with a history of chronic low back pain since 2012. The patient indicates that she went in for gallbladder surgery, and since that time she has had back pain that is persistent. She indicates that the pain generally stays in the low back, it rarely radiates down the left leg. The pain may become quite severe at times, lasting 2 weeks with pain that spreads up into the thoracic and lower cervical area as well. The pain does not radiate the down arms, the patient denies any weakness. She does have some numbness in the feet associated with her diabetes, but she denies any significant numbness on the legs or arms or body. The patient denies issues controlling the bowels or the bladder, she denies any changes in balance. She has no true weakness of the extremities. She denies headaches. She may have some nausea associated with the episodes of severe back pain. She may feel weak all over during episodes. She recently had an episode about 2 or 3 weeks ago. She indicates that she always has some degree of back pain. He comes to this office for an evaluation.  Past Medical History  Diagnosis Date  . Diabetes mellitus   . Hypertension   . Lack of adequate sleep   . Fatigue   . Full dentures   . Rash     on right breast  . Abdominal pain   . Hemorrhoids   . Wears glasses   . Constipation   . Arthritis   . Back pain   . Liver mass     Past Surgical History  Procedure Laterality Date  . Cholecystectomy, laparoscopic  07/24/2010  . Breast ductal system excision    . Esophageal dilation    . Abdominal hysterectomy      Family History  Problem Relation Age of Onset  . Alcohol abuse Other   . Diabetes Other   . Stroke Mother   . Other Father     broken heart after spouse spouse  . Diabetes Sister   .  Hypertension Sister   . Diabetes Sister   . Multiple sclerosis Sister     Social history:  reports that she has never smoked. She has never used smokeless tobacco. She reports that she does not drink alcohol or use illicit drugs.  Medications:  Prior to Admission medications   Medication Sig Start Date End Date Taking? Authorizing Provider  acyclovir ointment (ZOVIRAX) 5 % Apply 1 application topically every 3 (three) hours.   Yes Historical Provider, MD  amLODipine (NORVASC) 5 MG tablet Take 5 mg by mouth daily.   Yes Historical Provider, MD  benztropine (COGENTIN) 1 MG tablet Take 2 tablets (2 mg total) by mouth 2 (two) times daily. 09/11/13  Yes Heather Laisure, PA-C  carvedilol (COREG) 25 MG tablet Take 25 mg by mouth 2 (two) times daily with a meal.   Yes Historical Provider, MD  diltiazem (CARDIZEM CD) 240 MG 24 hr capsule Take 240 mg by mouth daily.   Yes Historical Provider, MD  fluconazole (DIFLUCAN) 200 MG tablet Take 1 tablet by mouth daily. 11/07/14  Yes Historical Provider, MD  metFORMIN (GLUCOPHAGE) 1000 MG tablet Take 1,000 mg by mouth 2 (two) times daily. 08/25/13  Yes Historical Provider, MD  omeprazole (PRILOSEC) 40 MG capsule Take 40  mg by mouth daily. 10/08/14  Yes Historical Provider, MD  ondansetron (ZOFRAN) 8 MG tablet Take 1 tablet (8 mg total) by mouth every 8 (eight) hours as needed for nausea or vomiting. 08/02/14  Yes Mancel Bale, MD  PARoxetine (PAXIL) 10 MG tablet Take 10 mg by mouth daily.   Yes Historical Provider, MD  pioglitazone (ACTOS) 15 MG tablet Take 15 mg by mouth daily.   Yes Historical Provider, MD  promethazine (PHENERGAN) 25 MG tablet Take 1 tablet (25 mg total) by mouth every 6 (six) hours as needed for nausea or vomiting. 09/11/13  Yes Heather Laisure, PA-C  traMADol (ULTRAM) 50 MG tablet Take 1 tablet (50 mg total) by mouth every 6 (six) hours as needed. 08/02/14  Yes Mancel Bale, MD  triamterene-hydrochlorothiazide (MAXZIDE-25) 37.5-25 MG tablet Take  0.5 tablets by mouth daily. 08/29/14  Yes Historical Provider, MD  valACYclovir (VALTREX) 1000 MG tablet Take 1,000 mg by mouth 3 (three) times daily.    Historical Provider, MD      Allergies  Allergen Reactions  . Aspirin Other (See Comments)    Unkown  . Fruit & Vegetable Daily [Nutritional Supplements] Other (See Comments)    Unknown  . Tylenol [Acetaminophen] Other (See Comments)    Cannot take due to Liver    ROS:  Out of a complete 14 system review of symptoms, the patient complains only of the following symptoms, and all other reviewed systems are negative.  Fatigue Chest pain, swelling in the legs Difficulty swallowing, esophageal stricture Blurred vision Shortness of breath, snoring Constipation Feeling hot, cold Joint pain Allergies Memory loss, weakness, difficulty swallowing, dizziness, passing out Anxiety, not enough sleep, decreased energy Restless legs  Blood pressure 144/93, pulse 93, height  (1.575 m), weight 171 lb (77.565 kg).  Physical Exam  General: The patient is alert and cooperative at the time of the examination.  Eyes: Pupils are equal, round, and reactive to light. Discs are flat bilaterally.  Neck: The neck is supple, no carotid bruits are noted.  Respiratory: The respiratory examination is clear.  Cardiovascular: The cardiovascular examination reveals a regular rate and rhythm, no obvious murmurs or rubs are noted.  Neuromuscular: Range of movement of the lumbar and cervical spines are within normal limits.  Skin: Extremities are without significant edema.  Neurologic Exam  Mental status: The patient is alert and oriented x 3 at the time of the examination. The patient has apparent normal recent and remote memory, with an apparently normal attention span and concentration ability.  Cranial nerves: Facial symmetry is present. There is good sensation of the face to pinprick and soft touch bilaterally. The strength of the facial  muscles and the muscles to head turning and shoulder shrug are normal bilaterally. Speech is well enunciated, no aphasia or dysarthria is noted. Extraocular movements are full. Visual fields are full. The tongue is midline, and the patient has symmetric elevation of the soft palate. No obvious hearing deficits are noted.  Motor: The motor testing reveals 5 over 5 strength of all 4 extremities. Good symmetric motor tone is noted throughout.  Sensory: Sensory testing is intact to pinprick, soft touch, vibration sensation, and position sense on all 4 extremities. No evidence of extinction is noted.  Coordination: Cerebellar testing reveals good finger-nose-finger and heel-to-shin bilaterally.  Gait and station: Gait is normal. Tandem gait is slightly unsteady. Romberg is negative. No drift is seen.  Reflexes: Deep tendon reflexes are symmetric, but are depressed bilaterally. Toes are downgoing  bilaterally.   Assessment/Plan:  1. Chronic low back pain, spine pain  2. Diabetes  The patient has a relatively unremarkable clinical examination. She reports chronic low back pain with occasional exacerbations of pain that spread up the spine. The patient may be having episodes of spasm. She will be sent for physical therapy for neuromuscular therapy, MRI of the low back will be done, the patient will be given gabapentin for the pain. She will follow-up in 4 months.  Marlan Palau MD 11/15/2014 3:55 PM  [facility] [address] [place], [place] [address]  Phone [phone] Fax [phone]

## 2014-12-02 DIAGNOSIS — K222 Esophageal obstruction: Secondary | ICD-10-CM | POA: Diagnosis not present

## 2014-12-02 DIAGNOSIS — R1310 Dysphagia, unspecified: Secondary | ICD-10-CM | POA: Diagnosis not present

## 2014-12-10 DIAGNOSIS — K222 Esophageal obstruction: Secondary | ICD-10-CM | POA: Diagnosis not present

## 2014-12-10 DIAGNOSIS — R1310 Dysphagia, unspecified: Secondary | ICD-10-CM | POA: Diagnosis not present

## 2014-12-31 ENCOUNTER — Ambulatory Visit: Payer: Medicare Other | Attending: Neurology

## 2015-01-31 DIAGNOSIS — E1165 Type 2 diabetes mellitus with hyperglycemia: Secondary | ICD-10-CM | POA: Diagnosis not present

## 2015-01-31 DIAGNOSIS — Z79899 Other long term (current) drug therapy: Secondary | ICD-10-CM | POA: Diagnosis not present

## 2015-01-31 DIAGNOSIS — I10 Essential (primary) hypertension: Secondary | ICD-10-CM | POA: Diagnosis not present

## 2015-03-20 ENCOUNTER — Ambulatory Visit: Payer: Medicare Other | Admitting: Adult Health

## 2015-03-21 ENCOUNTER — Ambulatory Visit: Payer: Medicare Other | Admitting: Neurology

## 2015-03-24 ENCOUNTER — Encounter: Payer: Self-pay | Admitting: Adult Health

## 2015-04-23 DIAGNOSIS — Z0000 Encounter for general adult medical examination without abnormal findings: Secondary | ICD-10-CM | POA: Diagnosis not present

## 2015-04-23 DIAGNOSIS — Z1239 Encounter for other screening for malignant neoplasm of breast: Secondary | ICD-10-CM | POA: Diagnosis not present

## 2015-04-23 DIAGNOSIS — Z79899 Other long term (current) drug therapy: Secondary | ICD-10-CM | POA: Diagnosis not present

## 2015-04-23 DIAGNOSIS — I10 Essential (primary) hypertension: Secondary | ICD-10-CM | POA: Diagnosis not present

## 2015-04-23 DIAGNOSIS — Z23 Encounter for immunization: Secondary | ICD-10-CM | POA: Diagnosis not present

## 2015-06-23 ENCOUNTER — Other Ambulatory Visit: Payer: Self-pay | Admitting: Internal Medicine

## 2015-06-23 ENCOUNTER — Other Ambulatory Visit: Payer: Self-pay | Admitting: Nurse Practitioner

## 2015-06-23 ENCOUNTER — Other Ambulatory Visit: Payer: Self-pay

## 2015-06-23 DIAGNOSIS — Z1231 Encounter for screening mammogram for malignant neoplasm of breast: Secondary | ICD-10-CM

## 2015-06-25 ENCOUNTER — Ambulatory Visit
Admission: RE | Admit: 2015-06-25 | Discharge: 2015-06-25 | Disposition: A | Discharge status: Home / Self Care | Payer: Medicare Other | Source: Ambulatory Visit

## 2015-06-25 DIAGNOSIS — Z1231 Encounter for screening mammogram for malignant neoplasm of breast: Secondary | ICD-10-CM

## 2015-07-20 ENCOUNTER — Emergency Department (HOSPITAL_COMMUNITY): Payer: Medicare Other

## 2015-07-20 ENCOUNTER — Emergency Department (HOSPITAL_COMMUNITY)
Admission: EM | Admit: 2015-07-20 | Discharge: 2015-07-21 | Disposition: A | Discharge status: Home / Self Care | Payer: Medicare Other | Attending: Physician Assistant | Admitting: Physician Assistant

## 2015-07-20 ENCOUNTER — Encounter (HOSPITAL_COMMUNITY): Payer: Self-pay | Admitting: Emergency Medicine

## 2015-07-20 DIAGNOSIS — K449 Diaphragmatic hernia without obstruction or gangrene: Secondary | ICD-10-CM | POA: Diagnosis not present

## 2015-07-20 DIAGNOSIS — R110 Nausea: Secondary | ICD-10-CM | POA: Diagnosis not present

## 2015-07-20 DIAGNOSIS — Z7984 Long term (current) use of oral hypoglycemic drugs: Secondary | ICD-10-CM | POA: Insufficient documentation

## 2015-07-20 DIAGNOSIS — R112 Nausea with vomiting, unspecified: Secondary | ICD-10-CM | POA: Diagnosis not present

## 2015-07-20 DIAGNOSIS — Z79899 Other long term (current) drug therapy: Secondary | ICD-10-CM | POA: Diagnosis not present

## 2015-07-20 DIAGNOSIS — I10 Essential (primary) hypertension: Secondary | ICD-10-CM | POA: Insufficient documentation

## 2015-07-20 DIAGNOSIS — E119 Type 2 diabetes mellitus without complications: Secondary | ICD-10-CM | POA: Diagnosis not present

## 2015-07-20 DIAGNOSIS — R1084 Generalized abdominal pain: Secondary | ICD-10-CM | POA: Insufficient documentation

## 2015-07-20 DIAGNOSIS — Z91010 Allergy to peanuts: Secondary | ICD-10-CM | POA: Insufficient documentation

## 2015-07-20 LAB — COMPREHENSIVE METABOLIC PANEL
ALK PHOS: 71 U/L (ref 38–126)
ALT: 16 U/L (ref 14–54)
ANION GAP: 8 (ref 5–15)
AST: 17 U/L (ref 15–41)
Albumin: 3.4 g/dL — ABNORMAL LOW (ref 3.5–5.0)
BILIRUBIN TOTAL: 0.6 mg/dL (ref 0.3–1.2)
BUN: <5 mg/dL — ABNORMAL LOW (ref 6–20)
CALCIUM: 9.3 mg/dL (ref 8.9–10.3)
CO2: 29 mmol/L (ref 22–32)
Chloride: 101 mmol/L (ref 101–111)
Creatinine, Ser: 0.68 mg/dL (ref 0.44–1.00)
GFR calc non Af Amer: >60 mL/min (ref >60)
Glucose, Bld: 175 mg/dL — ABNORMAL HIGH (ref 65–99)
Potassium: 4.1 mmol/L (ref 3.5–5.1)
SODIUM: 138 mmol/L (ref 135–145)
TOTAL PROTEIN: 7.8 g/dL (ref 6.5–8.1)

## 2015-07-20 LAB — LIPASE, BLOOD: LIPASE: 21 U/L (ref 11–51)

## 2015-07-20 LAB — CBC
HCT: 42.9 % (ref 36.0–46.0)
HEMOGLOBIN: 13.7 g/dL (ref 12.0–15.0)
MCH: 25.6 pg — ABNORMAL LOW (ref 26.0–34.0)
MCHC: 31.9 g/dL (ref 30.0–36.0)
MCV: 80.2 fL (ref 78.0–100.0)
Platelets: 299 10*3/uL (ref 150–400)
RBC: 5.35 MIL/uL — AB (ref 3.87–5.11)
RDW: 15 % (ref 11.5–15.5)
WBC: 5.4 10*3/uL (ref 4.0–10.5)

## 2015-07-20 MED ORDER — IOPAMIDOL (ISOVUE-300) INJECTION 61%
INTRAVENOUS | Status: AC
  Administered 2015-07-20: 100 mL
  Filled 2015-07-20: qty 100

## 2015-07-20 MED ORDER — SODIUM CHLORIDE 0.9 % IV BOLUS (SEPSIS)
1000.0000 mL | Freq: Once | INTRAVENOUS | Status: AC
  Administered 2015-07-20: 1000 mL via INTRAVENOUS

## 2015-07-20 NOTE — ED Notes (Signed)
Patient transported to CT 

## 2015-07-20 NOTE — ED Provider Notes (Signed)
CSN: [id number]     Arrival date & time 07/20/15  1738 History   First MD Initiated Contact with Patient 07/20/15 2006     Chief Complaint  Patient presents with  . Pain  . Weakness  . Emesis     (Consider location/radiation/quality/duration/timing/severity/associated sxs/prior Treatment) HPI   Patietn is a 65 year Old female with history of diabetes, hypertension, need for serial esophageal dilatations presenting today with decreased appetite, nausea, vomiting. Her last bowel movement was 8 days ago. Patient report she has history of constipation in the past. She reports that she's also had some urinary urgency. No fevers. She is not taking anything daily for constipation.  Patient does not have similar symptoms to last time she needed a dilatation.    Past Medical History  Diagnosis Date  . Diabetes mellitus   . Hypertension   . Lack of adequate sleep   . Fatigue   . Full dentures   . Rash     on right breast  . Abdominal pain   . Hemorrhoids   . Wears glasses   . Constipation   . Arthritis   . Back pain   . Liver mass    Past Surgical History  Procedure Laterality Date  . Cholecystectomy, laparoscopic  07/24/2010  . Breast ductal system excision    . Esophageal dilation    . Abdominal hysterectomy     Family History  Problem Relation Age of Onset  . Alcohol abuse Other   . Diabetes Other   . Stroke Mother   . Other Father     broken heart after spouse spouse  . Diabetes Sister   . Hypertension Sister   . Diabetes Sister   . Multiple sclerosis Sister    Social History  Substance Use Topics  . Smoking status: Never Smoker   . Smokeless tobacco: Never Used  . Alcohol Use: No   OB History    No data available     Review of Systems  Constitutional: Negative for activity change and fatigue.  HENT: Negative for congestion.   Eyes: Negative for discharge.  Respiratory: Negative for cough and chest tightness.   Cardiovascular: Negative for chest pain.   Gastrointestinal: Positive for constipation. Negative for diarrhea, blood in stool and abdominal distention.  Genitourinary: Positive for frequency. Negative for dysuria and difficulty urinating.  Musculoskeletal: Negative for joint swelling.  Skin: Negative for rash.  Allergic/Immunologic: Negative for immunocompromised state.  Neurological: Negative for seizures and speech difficulty.  Psychiatric/Behavioral: Negative for behavioral problems and agitation.  All other systems reviewed and are negative.     Allergies  Aspirin; Fruit & vegetable daily; Other; Peanut-containing drug products; and Tylenol  Home Medications   Prior to Admission medications   Medication Sig Start Date End Date Taking? Authorizing Provider  benztropine (COGENTIN) 1 MG tablet Take 2 tablets (2 mg total) by mouth 2 (two) times daily. 09/11/13  Yes Heather Laisure, PA-C  benztropine (COGENTIN) 2 MG tablet Take 4 mg by mouth at bedtime. 06/03/15  Yes Historical Provider, MD  carvedilol (COREG) 25 MG tablet Take 25 mg by mouth 2 (two) times daily with a meal.   Yes Historical Provider, MD  metFORMIN (GLUCOPHAGE) 1000 MG tablet Take 1,000 mg by mouth 2 (two) times daily. 08/25/13  Yes Historical Provider, MD  PARoxetine (PAXIL) 10 MG tablet Take 10 mg by mouth at bedtime.    Yes Historical Provider, MD  pioglitazone (ACTOS) 15 MG tablet Take 15 mg by mouth  daily.   Yes Historical Provider, MD  promethazine (PHENERGAN) 25 MG tablet Take 1 tablet (25 mg total) by mouth every 6 (six) hours as needed for nausea or vomiting. Patient taking differently: Take 25 mg by mouth daily.  09/11/13  Yes Heather Laisure, PA-C  triamterene-hydrochlorothiazide (MAXZIDE-25) 37.5-25 MG tablet Take 0.5 tablets by mouth daily. 08/29/14  Yes Historical Provider, MD  amLODipine (NORVASC) 5 MG tablet Take 5 mg by mouth daily.    Historical Provider, MD  diltiazem (CARDIZEM CD) 240 MG 24 hr capsule Take 240 mg by mouth at bedtime.     Historical  Provider, MD  gabapentin (NEURONTIN) 300 MG capsule One capsule at night for 1 week, then take one capsule twice a day Patient not taking: Reported on 07/20/2015 11/15/14   York Spaniel, MD  ondansetron (ZOFRAN) 4 MG tablet Take 1 tablet (4 mg total) by mouth every 8 (eight) hours as needed for nausea or vomiting. 07/21/15   Jeramiah Mccaughey Lyn Blessin Kanno, MD  ondansetron (ZOFRAN) 8 MG tablet Take 1 tablet (8 mg total) by mouth every 8 (eight) hours as needed for nausea or vomiting. Patient not taking: Reported on 07/20/2015 08/02/14   Mancel Bale, MD  senna-docusate (SENOKOT-S) 8.6-50 MG tablet Take 1 tablet by mouth at bedtime as needed for mild constipation. 07/21/15   Elza Sortor Lyn Shaylin Blatt, MD  traMADol (ULTRAM) 50 MG tablet Take 1 tablet (50 mg total) by mouth every 6 (six) hours as needed. Patient not taking: Reported on 07/20/2015 08/02/14   Mancel Bale, MD   BP 175/93 mmHg  Pulse 60  Temp(Src) 98.2 F (36.8 C)  Resp 18  SpO2 100% Physical Exam  Constitutional: She is oriented to person, place, and time. She appears well-developed and well-nourished.  HENT:  Head: Normocephalic and atraumatic.  Mildly dry mucous membranes.  Eyes: Conjunctivae are normal. Right eye exhibits no discharge.  Neck: Neck supple.  Cardiovascular: Normal rate, regular rhythm and normal heart sounds.   No murmur heard. Pulmonary/Chest: Effort normal and breath sounds normal. She has no wheezes. She has no rales.  Abdominal: Soft. She exhibits no distension. There is tenderness.  Diffuse nonfocal tenderness.  Musculoskeletal: Normal range of motion. She exhibits no edema.  Neurological: She is oriented to person, place, and time. No cranial nerve deficit.  Skin: Skin is warm and dry. No rash noted. She is not diaphoretic.  Psychiatric: She has a normal mood and affect. Her behavior is normal.  Nursing note and vitals reviewed.   ED Course  Procedures (including critical care time) Labs Review Labs Reviewed   COMPREHENSIVE METABOLIC PANEL - Abnormal; Notable for the following:    Glucose, Bld 175 (*)    BUN <5 (*)    Albumin 3.4 (*)    All other components within normal limits  CBC - Abnormal; Notable for the following:    RBC 5.35 (*)    MCH 25.6 (*)    All other components within normal limits  URINALYSIS, ROUTINE W REFLEX MICROSCOPIC (NOT AT [facility]) - Abnormal; Notable for the following:    Leukocytes, UA TRACE (*)    All other components within normal limits  URINE MICROSCOPIC-ADD ON - Abnormal; Notable for the following:    Squamous Epithelial / LPF 0-5 (*)    Bacteria, UA RARE (*)    All other components within normal limits  LIPASE, BLOOD    Imaging Review Ct Abdomen Pelvis W Contrast  07/20/2015  CLINICAL DATA:  Generalized abdominal pain, weakness, and  vomiting for 1 week. Previous symptoms after having esophagus stretched. Unable to eat or drink. EXAM: CT ABDOMEN AND PELVIS WITH CONTRAST TECHNIQUE: Multidetector CT imaging of the abdomen and pelvis was performed using the standard protocol following bolus administration of intravenous contrast. CONTRAST:  ISOVUE-300 IOPAMIDOL (ISOVUE-300) INJECTION 61% COMPARISON:  10/09/2014 FINDINGS: Mild dependent atelectasis in the lung bases. Small pericardial effusion. Small esophageal hiatal hernia. Thickening in distal esophagus may indicate reflux disease. Surgical absence of the gallbladder. No bile duct dilatation. The liver, spleen, pancreas, adrenal glands, abdominal aorta, inferior vena cava, and retroperitoneal lymph nodes are unremarkable. Mild parenchymal atrophy or scarring in the kidneys. Stomach, small bowel, and colon are not abnormally distended. No free air or free fluid in the abdomen. Pelvis: Uterus and ovaries are surgically absent. Bladder wall is not thickened. Scattered diverticula in the sigmoid colon without evidence of diverticulitis. The appendix is normal. No destructive bone lesions. IMPRESSION: Small esophageal  hiatal hernia with distal esophageal wall thickening. Small pericardial effusion. Mild fatty infiltration of the liver. No evidence of bowel obstruction or inflammation. Electronically Signed   By: Burman Nieves M.D.   On: 07/20/2015 23:49   I have personally reviewed and evaluated these images and lab results as part of my medical decision-making.   EKG Interpretation None      MDM   Final diagnoses:  Nausea   She has a very pleasant 65 year old female with history of diabetes, and multiple esophageal dilatations in the past. And constipation. Patient not had a bowel movement in 8 days. She reports that she's had mild nausea. Has had trouble keeping things down. Patient has mild tenderness diffusely. I think this is likely represents constipation. However given patient's comp located past medical history, we will get a CAT scan to confirm that there is no acute process. Otherwise labs are so far normal. UA is pending.  CT normal. Patietn tolerated PO without difficulty.  Will give medicines for contiaption, nausea and have patient follow up with GI physician.   Eloina Ergle Randall An, MD 07/21/15 2310

## 2015-07-20 NOTE — ED Notes (Addendum)
Pt c/o generalized body pain, weakness and vomiting x 1 week. Pt had symptoms in past since getting her esophagus stretched. Family reports pt has not been taking her medications or able to eat or drink well.

## 2015-07-21 DIAGNOSIS — R112 Nausea with vomiting, unspecified: Secondary | ICD-10-CM | POA: Diagnosis not present

## 2015-07-21 LAB — URINALYSIS, ROUTINE W REFLEX MICROSCOPIC
BILIRUBIN URINE: NEGATIVE
GLUCOSE, UA: NEGATIVE mg/dL
HGB URINE DIPSTICK: NEGATIVE
Ketones, ur: NEGATIVE mg/dL
Nitrite: NEGATIVE
Protein, ur: NEGATIVE mg/dL
SPECIFIC GRAVITY, URINE: 1.015 (ref 1.005–1.030)
pH: 6 (ref 5.0–8.0)

## 2015-07-21 LAB — URINE MICROSCOPIC-ADD ON

## 2015-07-21 MED ORDER — ONDANSETRON HCL 4 MG PO TABS
4.0000 mg | ORAL_TABLET | Freq: Once | ORAL | Status: AC
  Administered 2015-07-21: 4 mg via ORAL
  Filled 2015-07-21: qty 1

## 2015-07-21 MED ORDER — ONDANSETRON HCL 4 MG PO TABS: 4.0000 mg | ORAL_TABLET | Freq: Three times a day (TID) | ORAL | Status: DC | PRN

## 2015-07-21 MED ORDER — SENNOSIDES-DOCUSATE SODIUM 8.6-50 MG PO TABS: 1.0000 | ORAL_TABLET | Freq: Every evening | ORAL | Status: DC | PRN

## 2015-07-21 NOTE — ED Notes (Signed)
Patient able to ambulate independently  

## 2015-07-21 NOTE — Discharge Instructions (Signed)
We will need to follow-up with your GI physician. You had some thickening around your esophagus and may need to have a dilated again.  Nausea and Vomiting Nausea means you feel sick to your stomach. Throwing up (vomiting) is a reflex where stomach contents come out of your mouth. HOME CARE   Take medicine as told by your doctor.  Do not force yourself to eat. However, you do need to drink fluids.  If you feel like eating, eat a normal diet as told by your doctor.  Eat rice, wheat, potatoes, bread, lean meats, yogurt, fruits, and vegetables.  Avoid high-fat foods.  Drink enough fluids to keep your pee (urine) clear or pale yellow.  Ask your doctor how to replace body fluid losses (rehydrate). Signs of body fluid loss (dehydration) include:  Feeling very thirsty.  Dry lips and mouth.  Feeling dizzy.  Dark pee.  Peeing less than normal.  Feeling confused.  Fast breathing or heart rate. GET HELP RIGHT AWAY IF:   You have blood in your throw up.  You have black or bloody poop (stool).  You have a bad headache or stiff neck.  You feel confused.  You have bad belly (abdominal) pain.  You have chest pain or trouble breathing.  You do not pee at least once every 8 hours.  You have cold, clammy skin.  You keep throwing up after 24 to 48 hours.  You have a fever. MAKE SURE YOU:   Understand these instructions.  Will watch your condition.  Will get help right away if you are not doing well or get worse.   This information is not intended to replace advice given to you by your health care provider. Make sure you discuss any questions you have with your health care provider.   Document Released: 06/16/2007 Document Revised: 03/22/2011 Document Reviewed: 05/29/2010 Elsevier Interactive Patient Education [employer]

## 2015-07-25 DIAGNOSIS — Z79899 Other long term (current) drug therapy: Secondary | ICD-10-CM | POA: Diagnosis not present

## 2015-07-25 DIAGNOSIS — R109 Unspecified abdominal pain: Secondary | ICD-10-CM | POA: Diagnosis not present

## 2015-07-25 DIAGNOSIS — E1165 Type 2 diabetes mellitus with hyperglycemia: Secondary | ICD-10-CM | POA: Diagnosis not present

## 2015-07-25 DIAGNOSIS — R112 Nausea with vomiting, unspecified: Secondary | ICD-10-CM | POA: Diagnosis not present

## 2015-07-25 DIAGNOSIS — I10 Essential (primary) hypertension: Secondary | ICD-10-CM | POA: Diagnosis not present

## 2015-07-28 DIAGNOSIS — K222 Esophageal obstruction: Secondary | ICD-10-CM | POA: Diagnosis not present

## 2015-07-28 DIAGNOSIS — R1310 Dysphagia, unspecified: Secondary | ICD-10-CM | POA: Diagnosis not present

## 2015-07-28 DIAGNOSIS — R1013 Epigastric pain: Secondary | ICD-10-CM | POA: Diagnosis not present

## 2015-07-31 DIAGNOSIS — E119 Type 2 diabetes mellitus without complications: Secondary | ICD-10-CM | POA: Diagnosis not present

## 2015-08-12 DIAGNOSIS — B3781 Candidal esophagitis: Secondary | ICD-10-CM | POA: Diagnosis not present

## 2015-08-12 DIAGNOSIS — K222 Esophageal obstruction: Secondary | ICD-10-CM | POA: Diagnosis not present

## 2015-08-12 DIAGNOSIS — R1310 Dysphagia, unspecified: Secondary | ICD-10-CM | POA: Diagnosis not present

## 2015-09-02 DIAGNOSIS — I313 Pericardial effusion (noninflammatory): Secondary | ICD-10-CM | POA: Diagnosis not present

## 2015-09-04 ENCOUNTER — Other Ambulatory Visit: Payer: Self-pay | Admitting: Internal Medicine

## 2015-09-04 ENCOUNTER — Ambulatory Visit
Admission: RE | Admit: 2015-09-04 | Discharge: 2015-09-04 | Disposition: A | Discharge status: Home / Self Care | Payer: Medicare Other | Source: Ambulatory Visit | Attending: Internal Medicine | Admitting: Internal Medicine

## 2015-09-04 DIAGNOSIS — Z1231 Encounter for screening mammogram for malignant neoplasm of breast: Secondary | ICD-10-CM

## 2015-10-16 DIAGNOSIS — L309 Dermatitis, unspecified: Secondary | ICD-10-CM | POA: Diagnosis not present

## 2015-10-16 DIAGNOSIS — I10 Essential (primary) hypertension: Secondary | ICD-10-CM | POA: Diagnosis not present

## 2015-10-16 DIAGNOSIS — Z79899 Other long term (current) drug therapy: Secondary | ICD-10-CM | POA: Diagnosis not present

## 2015-10-16 DIAGNOSIS — B372 Candidiasis of skin and nail: Secondary | ICD-10-CM | POA: Diagnosis not present

## 2015-10-16 DIAGNOSIS — E1165 Type 2 diabetes mellitus with hyperglycemia: Secondary | ICD-10-CM | POA: Diagnosis not present

## 2015-10-28 DIAGNOSIS — I313 Pericardial effusion (noninflammatory): Secondary | ICD-10-CM | POA: Diagnosis not present

## 2015-10-28 DIAGNOSIS — I10 Essential (primary) hypertension: Secondary | ICD-10-CM | POA: Diagnosis not present

## 2015-10-28 DIAGNOSIS — I951 Orthostatic hypotension: Secondary | ICD-10-CM | POA: Diagnosis not present

## 2015-12-01 DIAGNOSIS — I951 Orthostatic hypotension: Secondary | ICD-10-CM | POA: Diagnosis not present

## 2015-12-01 DIAGNOSIS — I10 Essential (primary) hypertension: Secondary | ICD-10-CM | POA: Diagnosis not present

## 2015-12-01 DIAGNOSIS — I313 Pericardial effusion (noninflammatory): Secondary | ICD-10-CM | POA: Diagnosis not present

## 2016-01-26 DIAGNOSIS — E119 Type 2 diabetes mellitus without complications: Secondary | ICD-10-CM | POA: Diagnosis not present

## 2016-01-26 DIAGNOSIS — E785 Hyperlipidemia, unspecified: Secondary | ICD-10-CM | POA: Diagnosis not present

## 2016-01-26 DIAGNOSIS — I10 Essential (primary) hypertension: Secondary | ICD-10-CM | POA: Diagnosis not present

## 2016-01-26 DIAGNOSIS — J309 Allergic rhinitis, unspecified: Secondary | ICD-10-CM | POA: Diagnosis not present

## 2016-02-19 DIAGNOSIS — I313 Pericardial effusion (noninflammatory): Secondary | ICD-10-CM | POA: Diagnosis not present

## 2016-03-22 DIAGNOSIS — H40013 Open angle with borderline findings, low risk, bilateral: Secondary | ICD-10-CM | POA: Diagnosis not present

## 2016-04-19 DIAGNOSIS — I951 Orthostatic hypotension: Secondary | ICD-10-CM | POA: Diagnosis not present

## 2016-04-19 DIAGNOSIS — I313 Pericardial effusion (noninflammatory): Secondary | ICD-10-CM | POA: Diagnosis not present

## 2016-04-19 DIAGNOSIS — I10 Essential (primary) hypertension: Secondary | ICD-10-CM | POA: Diagnosis not present

## 2016-04-23 DIAGNOSIS — I10 Essential (primary) hypertension: Secondary | ICD-10-CM | POA: Diagnosis not present

## 2016-04-23 DIAGNOSIS — I313 Pericardial effusion (noninflammatory): Secondary | ICD-10-CM | POA: Diagnosis not present

## 2016-04-30 DIAGNOSIS — I10 Essential (primary) hypertension: Secondary | ICD-10-CM | POA: Diagnosis not present

## 2016-04-30 DIAGNOSIS — Z23 Encounter for immunization: Secondary | ICD-10-CM | POA: Diagnosis not present

## 2016-04-30 DIAGNOSIS — Z0000 Encounter for general adult medical examination without abnormal findings: Secondary | ICD-10-CM | POA: Diagnosis not present

## 2016-04-30 DIAGNOSIS — E1165 Type 2 diabetes mellitus with hyperglycemia: Secondary | ICD-10-CM | POA: Diagnosis not present

## 2016-05-03 ENCOUNTER — Other Ambulatory Visit: Payer: Self-pay | Admitting: Nurse Practitioner

## 2016-05-03 DIAGNOSIS — Z1231 Encounter for screening mammogram for malignant neoplasm of breast: Secondary | ICD-10-CM

## 2016-05-03 DIAGNOSIS — Z0000 Encounter for general adult medical examination without abnormal findings: Secondary | ICD-10-CM | POA: Diagnosis not present

## 2016-05-03 DIAGNOSIS — I10 Essential (primary) hypertension: Secondary | ICD-10-CM | POA: Diagnosis not present

## 2016-07-23 DIAGNOSIS — Z83511 Family history of glaucoma: Secondary | ICD-10-CM | POA: Diagnosis not present

## 2016-07-23 DIAGNOSIS — H33313 Horseshoe tear of retina without detachment, bilateral: Secondary | ICD-10-CM | POA: Diagnosis not present

## 2016-07-23 DIAGNOSIS — H33312 Horseshoe tear of retina without detachment, left eye: Secondary | ICD-10-CM | POA: Diagnosis not present

## 2016-07-23 DIAGNOSIS — H43813 Vitreous degeneration, bilateral: Secondary | ICD-10-CM | POA: Diagnosis not present

## 2016-07-23 DIAGNOSIS — E119 Type 2 diabetes mellitus without complications: Secondary | ICD-10-CM | POA: Diagnosis not present

## 2016-07-23 DIAGNOSIS — H40013 Open angle with borderline findings, low risk, bilateral: Secondary | ICD-10-CM | POA: Diagnosis not present

## 2016-07-23 DIAGNOSIS — H2513 Age-related nuclear cataract, bilateral: Secondary | ICD-10-CM | POA: Diagnosis not present

## 2016-07-23 DIAGNOSIS — H25013 Cortical age-related cataract, bilateral: Secondary | ICD-10-CM | POA: Diagnosis not present

## 2016-07-27 DIAGNOSIS — H33311 Horseshoe tear of retina without detachment, right eye: Secondary | ICD-10-CM | POA: Diagnosis not present

## 2016-08-27 DIAGNOSIS — Z9114 Patient's other noncompliance with medication regimen: Secondary | ICD-10-CM | POA: Diagnosis not present

## 2016-08-27 DIAGNOSIS — I10 Essential (primary) hypertension: Secondary | ICD-10-CM | POA: Diagnosis not present

## 2016-08-27 DIAGNOSIS — E785 Hyperlipidemia, unspecified: Secondary | ICD-10-CM | POA: Diagnosis not present

## 2016-08-27 DIAGNOSIS — E1165 Type 2 diabetes mellitus with hyperglycemia: Secondary | ICD-10-CM | POA: Diagnosis not present

## 2016-08-31 DIAGNOSIS — H33313 Horseshoe tear of retina without detachment, bilateral: Secondary | ICD-10-CM | POA: Diagnosis not present

## 2016-09-06 ENCOUNTER — Ambulatory Visit
Admission: RE | Admit: 2016-09-06 | Discharge: 2016-09-06 | Disposition: A | Discharge status: Home / Self Care | Payer: Medicare Other | Source: Ambulatory Visit | Attending: Nurse Practitioner | Admitting: Nurse Practitioner

## 2016-09-06 DIAGNOSIS — Z1231 Encounter for screening mammogram for malignant neoplasm of breast: Secondary | ICD-10-CM | POA: Diagnosis not present

## 2016-09-19 ENCOUNTER — Emergency Department (HOSPITAL_COMMUNITY): Payer: Medicare Other

## 2016-09-19 ENCOUNTER — Encounter (HOSPITAL_COMMUNITY): Payer: Self-pay | Admitting: Emergency Medicine

## 2016-09-19 ENCOUNTER — Emergency Department (HOSPITAL_COMMUNITY)
Admission: EM | Admit: 2016-09-19 | Discharge: 2016-09-20 | Disposition: A | Discharge status: Home / Self Care | Payer: Medicare Other | Attending: Emergency Medicine | Admitting: Emergency Medicine

## 2016-09-19 DIAGNOSIS — M79601 Pain in right arm: Secondary | ICD-10-CM | POA: Insufficient documentation

## 2016-09-19 DIAGNOSIS — W19XXXA Unspecified fall, initial encounter: Secondary | ICD-10-CM | POA: Insufficient documentation

## 2016-09-19 DIAGNOSIS — M25511 Pain in right shoulder: Secondary | ICD-10-CM | POA: Diagnosis not present

## 2016-09-19 DIAGNOSIS — Z7984 Long term (current) use of oral hypoglycemic drugs: Secondary | ICD-10-CM | POA: Insufficient documentation

## 2016-09-19 DIAGNOSIS — Z79899 Other long term (current) drug therapy: Secondary | ICD-10-CM | POA: Diagnosis not present

## 2016-09-19 DIAGNOSIS — Z91010 Allergy to peanuts: Secondary | ICD-10-CM | POA: Diagnosis not present

## 2016-09-19 DIAGNOSIS — E1165 Type 2 diabetes mellitus with hyperglycemia: Secondary | ICD-10-CM | POA: Insufficient documentation

## 2016-09-19 DIAGNOSIS — I10 Essential (primary) hypertension: Secondary | ICD-10-CM | POA: Diagnosis not present

## 2016-09-19 DIAGNOSIS — R739 Hyperglycemia, unspecified: Secondary | ICD-10-CM

## 2016-09-19 DIAGNOSIS — Z9114 Patient's other noncompliance with medication regimen: Secondary | ICD-10-CM | POA: Diagnosis not present

## 2016-09-19 LAB — BASIC METABOLIC PANEL
Anion gap: 10 (ref 5–15)
BUN: 14 mg/dL (ref 6–20)
CO2: 23 mmol/L (ref 22–32)
Calcium: 9.2 mg/dL (ref 8.9–10.3)
Chloride: 96 mmol/L — ABNORMAL LOW (ref 101–111)
Creatinine, Ser: 0.75 mg/dL (ref 0.44–1.00)
GFR calc Af Amer: >60 mL/min (ref >60)
GLUCOSE: 500 mg/dL — AB (ref 65–99)
POTASSIUM: 4.1 mmol/L (ref 3.5–5.1)
Sodium: 129 mmol/L — ABNORMAL LOW (ref 135–145)

## 2016-09-19 LAB — URINALYSIS, ROUTINE W REFLEX MICROSCOPIC
Bilirubin Urine: NEGATIVE
Glucose, UA: >=500 mg/dL — AB
Ketones, ur: NEGATIVE mg/dL
Leukocytes, UA: NEGATIVE
NITRITE: NEGATIVE
PROTEIN: NEGATIVE mg/dL
SPECIFIC GRAVITY, URINE: 1.027 (ref 1.005–1.030)
pH: 5 (ref 5.0–8.0)

## 2016-09-19 LAB — CBC
HEMATOCRIT: 41 % (ref 36.0–46.0)
Hemoglobin: 13.2 g/dL (ref 12.0–15.0)
MCH: 24.9 pg — AB (ref 26.0–34.0)
MCHC: 32.2 g/dL (ref 30.0–36.0)
MCV: 77.4 fL — ABNORMAL LOW (ref 78.0–100.0)
Platelets: 198 10*3/uL (ref 150–400)
RBC: 5.3 MIL/uL — ABNORMAL HIGH (ref 3.87–5.11)
RDW: 14 % (ref 11.5–15.5)
WBC: 9.8 10*3/uL (ref 4.0–10.5)

## 2016-09-19 LAB — CBG MONITORING, ED
GLUCOSE-CAPILLARY: 435 mg/dL — AB (ref 65–99)
Glucose-Capillary: 523 mg/dL (ref 65–99)

## 2016-09-19 MED ORDER — SODIUM CHLORIDE 0.9 % IV BOLUS (SEPSIS)
1000.0000 mL | Freq: Once | INTRAVENOUS | Status: AC
  Administered 2016-09-19: 1000 mL via INTRAVENOUS

## 2016-09-19 MED ORDER — INSULIN ASPART 100 UNIT/ML ~~LOC~~ SOLN
10.0000 [IU] | Freq: Once | SUBCUTANEOUS | Status: AC
  Administered 2016-09-19: 10 [IU] via SUBCUTANEOUS
  Filled 2016-09-19: qty 1

## 2016-09-19 MED ORDER — FENTANYL CITRATE (PF) 100 MCG/2ML IJ SOLN
50.0000 ug | Freq: Once | INTRAMUSCULAR | Status: AC
  Administered 2016-09-19: 50 ug via INTRAVENOUS
  Filled 2016-09-19: qty 2

## 2016-09-19 NOTE — ED Provider Notes (Signed)
[facility]-EMERGENCY DEPT Provider Note   CSN: [id number] Arrival date & time: 09/19/16  2106     History   Chief Complaint Chief Complaint  Patient presents with  . Arm Pain  . Hyperglycemia    HPI [name] [name] is a 66 y.o. female.  Patient presents to the ED with a chief complaint of right arm pain.  She states that she fell to the ground at a birthday party about 2 weeks ago.  She states that she has been having intermittent right shoulder pain since then, but this has worsened significantly over the past several days.  She states that now she is unable to move her shoulder at all without significant pain.  She denies any numbness, weakness, or tingling.  She denies any fevers, chills, or rash.  She states that she has been out of her home meds for the past several weeks. She states that she just got her med refilled yesterday, but has otherwise been non-compliant with her meds.  She states that she knows her blood sugar and blood pressure are high.     The history is provided by the patient. No language interpreter was used.    Past Medical History:  Diagnosis Date  . Abdominal pain   . Arthritis   . Back pain   . Constipation   . Diabetes mellitus   . Fatigue   . Full dentures   . Hemorrhoids   . Hypertension   . Lack of adequate sleep   . Liver mass   . Rash    on right breast  . Wears glasses     Patient Active Problem List   Diagnosis Date Noted  . Back pain 11/15/2014  . Pericardial effusion 05/12/2010  . HTN (hypertension) 05/12/2010  . Mixed hyperlipidemia 05/12/2010    Past Surgical History:  Procedure Laterality Date  . ABDOMINAL HYSTERECTOMY    . BREAST DUCTAL SYSTEM EXCISION    . BREAST EXCISIONAL BIOPSY Left   . CHOLECYSTECTOMY, LAPAROSCOPIC  07/24/2010  . ESOPHAGEAL DILATION      OB History    No data available       Home Medications    Prior to Admission medications   Medication Sig Start Date End Date Taking? Authorizing Provider    atorvastatin (LIPITOR) 10 MG tablet Take 10 mg by mouth daily at 6 PM.   Yes [provider]  carvedilol (COREG) 25 MG tablet Take 25 mg by mouth 2 (two) times daily with a meal.   Yes [provider]  docusate sodium (COLACE) 100 MG capsule Take 200 mg by mouth 2 (two) times daily.   Yes [provider]  ibuprofen (ADVIL,MOTRIN) 200 MG tablet Take 800 mg by mouth every 6 (six) hours as needed for mild pain.   Yes [provider]  omeprazole (PRILOSEC) 40 MG capsule Take 40 mg by mouth daily.   Yes [provider]  PARoxetine (PAXIL) 10 MG tablet Take 10 mg by mouth at bedtime.    Yes [provider]  benztropine (COGENTIN) 1 MG tablet Take 2 tablets (2 mg total) by mouth 2 (two) times daily. Patient not taking: Reported on 09/19/2016 09/11/13   Santiago Glad, PA-C  gabapentin (NEURONTIN) 300 MG capsule One capsule at night for 1 week, then take one capsule twice a day Patient not taking: Reported on 07/20/2015 11/15/14   York Spaniel, MD  metFORMIN (GLUCOPHAGE) 1000 MG tablet Take 1,000 mg by mouth 2 (two) times daily.  08/25/13   [provider]  pioglitazone (ACTOS) 15 MG tablet Take 15 mg by mouth daily.    [provider]    Family History Family History  Problem Relation Age of Onset  . Stroke Mother   . Other Father        broken heart after spouse spouse  . Diabetes Sister   . Hypertension Sister   . Diabetes Sister   . Multiple sclerosis Sister   . Alcohol abuse Other   . Diabetes Other     Social History Social History  Substance Use Topics  . Smoking status: Never Smoker  . Smokeless tobacco: Never Used  . Alcohol use No     Allergies   Aspirin; Fruit & vegetable daily [nutritional supplements]; Other; Peanut-containing drug products; and Tylenol [acetaminophen]   Review of Systems Review of Systems  All other systems reviewed and are negative.    Physical Exam Updated Vital Signs BP  (!) 213/114 (BP Location: Right Arm)   Pulse 83   Temp 98.3 F (36.8 C) (Oral)   Resp 16   Ht  (1.6 m)   SpO2 97%   Physical Exam  Constitutional: She is oriented to person, place, and time. She appears well-developed and well-nourished.  HENT:  Head: Normocephalic and atraumatic.  Eyes: Pupils are equal, round, and reactive to light. Conjunctivae and EOM are normal.  Neck: Normal range of motion. Neck supple.  Cardiovascular: Normal rate and regular rhythm.  Exam reveals no gallop and no friction rub.   No murmur heard. Pulmonary/Chest: Effort normal and breath sounds normal. No respiratory distress. She has no wheezes. She has no rales. She exhibits no tenderness.  Abdominal: Soft. Bowel sounds are normal. She exhibits no distension and no mass. There is no tenderness. There is no rebound and no guarding.  Musculoskeletal: Normal range of motion. She exhibits no edema or tenderness.  Right shoulder tender to palpation, no bony abnormality or deformity, ROM and strength is severely limited due to pain  Neurological: She is alert and oriented to person, place, and time.  Skin: Skin is warm and dry.  Psychiatric: She has a normal mood and affect. Her behavior is normal. Judgment and thought content normal.  No redness, no abscess  Nursing note and vitals reviewed.    ED Treatments / Results  Labs (all labs ordered are listed, but only abnormal results are displayed) Labs Reviewed  CBC - Abnormal; Notable for the following:       Result Value   RBC 5.30 (*)    MCV 77.4 (*)    MCH 24.9 (*)    All other components within normal limits  URINALYSIS, ROUTINE W REFLEX MICROSCOPIC - Abnormal; Notable for the following:    Color, Urine COLORLESS (*)    Glucose, UA >=500 (*)    Hgb urine dipstick SMALL (*)    Bacteria, UA RARE (*)    Squamous Epithelial / LPF 0-5 (*)    All other components within normal limits  CBG MONITORING, ED - Abnormal; Notable for the following:     Glucose-Capillary 523 (*)    All other components within normal limits  BASIC METABOLIC PANEL  CBG MONITORING, ED    EKG  EKG Interpretation None       Radiology No results found.  Procedures Procedures (including critical care time)  Medications Ordered in ED Medications  insulin aspart (novoLOG) injection 10 Units (not administered)  fentaNYL (SUBLIMAZE) injection 50 mcg (not administered)  sodium chloride 0.9 % bolus 1,000 mL (not administered)     Initial Impression / Assessment and Plan / ED Course  I have reviewed the triage vital signs and the nursing notes.  Pertinent labs & imaging results that were available during my care of the patient were reviewed by me and considered in my medical decision making (see chart for details).     Patient with 2 complaints.  1. Right shoulder pain.  Gradually worsening x 2 weeks.  Recent fall.  Will check right shoulder plain films.  Could be frozen shoulder.  Doubt septic joint, the joint is not hot or swollen.  2. Hyperglycemia.  Has been out of her meds for several weeks.  CBG is 523.  Will give some fluid and subq insulin.  Anion gap is 10.  Patient's glucose is trending down. She states that she did get her medications filled yesterday, and will start taking them.  She has no evidence of an organ damage. Will recommend close follow-up with PCP for better blood pressure and glucose control. She will also need to follow-up with orthopedics for possible frozen shoulder.  Final Clinical Impressions(s) / ED Diagnoses   Final diagnoses:  Hyperglycemia  Right arm pain    New Prescriptions New Prescriptions   TRAMADOL (ULTRAM) 50 MG TABLET    Take 1 tablet (50 mg total) by mouth every 6 (six) hours as needed.     Roxy Horseman, PA-C 09/20/16 0103    Gilda Crease, MD 09/20/16 [phone]

## 2016-09-19 NOTE — ED Triage Notes (Signed)
Pt reports right arm pain for two days, yesterday having leg pain so bad she "could not walk".  She is unable to move it due to the pain, hypertensive at 213/114.  Pt CBG was over 500, pt has not taken diabetic medications in 2-3weeks (trouble getting meds).  Negative NIH scale.

## 2016-09-19 NOTE — ED Notes (Signed)
Patient transported to X-ray 

## 2016-09-20 LAB — CBG MONITORING, ED: GLUCOSE-CAPILLARY: 328 mg/dL — AB (ref 65–99)

## 2016-09-20 MED ORDER — TRAMADOL HCL 50 MG PO TABS: 50.0000 mg | ORAL_TABLET | Freq: Four times a day (QID) | ORAL | 0 refills | Status: DC | PRN

## 2016-09-20 NOTE — ED Notes (Signed)
Rob, [place] aware of pt BP.

## 2016-09-20 NOTE — Discharge Instructions (Signed)
You must take your diabetes and blood pressure medications.  Please follow-up with your doctor as soon as possible.  The x-ray of your shoulder is normal.  You need to follow-up with an orthopedic doctor.  Please make an appointment.  Please try to move your arm as much as possible in the day to avoid getting a frozen shoulder.

## 2016-09-20 NOTE — ED Notes (Signed)
Rob, PA notified of pt updated CBG.

## 2016-09-23 DIAGNOSIS — E1165 Type 2 diabetes mellitus with hyperglycemia: Secondary | ICD-10-CM | POA: Diagnosis not present

## 2016-09-23 DIAGNOSIS — Z9181 History of falling: Secondary | ICD-10-CM | POA: Diagnosis not present

## 2016-09-23 DIAGNOSIS — Z09 Encounter for follow-up examination after completed treatment for conditions other than malignant neoplasm: Secondary | ICD-10-CM | POA: Diagnosis not present

## 2016-09-23 DIAGNOSIS — M79601 Pain in right arm: Secondary | ICD-10-CM | POA: Diagnosis not present

## 2016-09-29 DIAGNOSIS — Z7189 Other specified counseling: Secondary | ICD-10-CM | POA: Diagnosis not present

## 2016-09-29 DIAGNOSIS — E1165 Type 2 diabetes mellitus with hyperglycemia: Secondary | ICD-10-CM | POA: Diagnosis not present

## 2016-10-05 DIAGNOSIS — M25511 Pain in right shoulder: Secondary | ICD-10-CM | POA: Diagnosis not present

## 2016-11-29 DIAGNOSIS — I10 Essential (primary) hypertension: Secondary | ICD-10-CM | POA: Diagnosis not present

## 2016-11-29 DIAGNOSIS — E119 Type 2 diabetes mellitus without complications: Secondary | ICD-10-CM | POA: Diagnosis not present

## 2016-11-29 DIAGNOSIS — E785 Hyperlipidemia, unspecified: Secondary | ICD-10-CM | POA: Diagnosis not present

## 2017-02-17 DIAGNOSIS — I10 Essential (primary) hypertension: Secondary | ICD-10-CM | POA: Diagnosis not present

## 2017-02-17 DIAGNOSIS — E785 Hyperlipidemia, unspecified: Secondary | ICD-10-CM | POA: Diagnosis not present

## 2017-02-17 DIAGNOSIS — E1165 Type 2 diabetes mellitus with hyperglycemia: Secondary | ICD-10-CM | POA: Diagnosis not present

## 2017-03-15 DIAGNOSIS — H04123 Dry eye syndrome of bilateral lacrimal glands: Secondary | ICD-10-CM | POA: Diagnosis not present

## 2017-03-15 DIAGNOSIS — H33313 Horseshoe tear of retina without detachment, bilateral: Secondary | ICD-10-CM | POA: Diagnosis not present

## 2017-03-15 DIAGNOSIS — H43811 Vitreous degeneration, right eye: Secondary | ICD-10-CM | POA: Diagnosis not present

## 2017-03-15 DIAGNOSIS — E119 Type 2 diabetes mellitus without complications: Secondary | ICD-10-CM | POA: Diagnosis not present

## 2017-03-15 DIAGNOSIS — H25813 Combined forms of age-related cataract, bilateral: Secondary | ICD-10-CM | POA: Diagnosis not present

## 2017-05-11 ENCOUNTER — Other Ambulatory Visit: Payer: Self-pay | Admitting: Nurse Practitioner

## 2017-05-11 DIAGNOSIS — E785 Hyperlipidemia, unspecified: Secondary | ICD-10-CM | POA: Diagnosis not present

## 2017-05-11 DIAGNOSIS — E119 Type 2 diabetes mellitus without complications: Secondary | ICD-10-CM | POA: Diagnosis not present

## 2017-05-11 DIAGNOSIS — Z1231 Encounter for screening mammogram for malignant neoplasm of breast: Secondary | ICD-10-CM

## 2017-05-11 DIAGNOSIS — I10 Essential (primary) hypertension: Secondary | ICD-10-CM | POA: Diagnosis not present

## 2017-05-11 DIAGNOSIS — Z1239 Encounter for other screening for malignant neoplasm of breast: Secondary | ICD-10-CM | POA: Diagnosis not present

## 2017-05-11 DIAGNOSIS — Z23 Encounter for immunization: Secondary | ICD-10-CM | POA: Diagnosis not present

## 2017-06-08 ENCOUNTER — Encounter: Payer: Self-pay | Admitting: Family Medicine

## 2017-06-08 ENCOUNTER — Telehealth: Payer: Self-pay | Admitting: Family Medicine

## 2017-06-08 ENCOUNTER — Ambulatory Visit (INDEPENDENT_AMBULATORY_CARE_PROVIDER_SITE_OTHER): Payer: Medicare Other | Admitting: Family Medicine

## 2017-06-08 VITALS — BP 140/80 | HR 75 | Temp 98.6°F | Ht 63.0 in | Wt 186.0 lb

## 2017-06-08 DIAGNOSIS — M810 Age-related osteoporosis without current pathological fracture: Secondary | ICD-10-CM | POA: Diagnosis not present

## 2017-06-08 DIAGNOSIS — Z0000 Encounter for general adult medical examination without abnormal findings: Secondary | ICD-10-CM | POA: Diagnosis not present

## 2017-06-08 DIAGNOSIS — E1165 Type 2 diabetes mellitus with hyperglycemia: Secondary | ICD-10-CM | POA: Insufficient documentation

## 2017-06-08 DIAGNOSIS — Z780 Asymptomatic menopausal state: Secondary | ICD-10-CM

## 2017-06-08 DIAGNOSIS — E118 Type 2 diabetes mellitus with unspecified complications: Secondary | ICD-10-CM | POA: Diagnosis not present

## 2017-06-08 HISTORY — DX: Type 2 diabetes mellitus with hyperglycemia: E11.65

## 2017-06-08 LAB — POCT GLYCOSYLATED HEMOGLOBIN (HGB A1C): HbA1c, POC (controlled diabetic range): 12.6 % — AB (ref 0.0–7.0)

## 2017-06-08 NOTE — Patient Instructions (Addendum)
It was nice meeting you today Ms. Fairweather!  Today, your A1c was 12.6, which is higher than our goal of <7.0.  Please make an appointment to see Dr. Raymondo Band for your diabetes at the window as you leave.    We are also getting labs today to check your cholesterol and your kidney and liver function.  I will call you with any abnormal results.  Please call [facility] to schedule your DEXA scan for your bones.    I would like to see you back in three months to continue discussing your diabetes and to get a repeat A1c.  If you have any questions or concerns, please feel free to call the clinic.   Be well,  Dr. Frances Furbish

## 2017-06-08 NOTE — Telephone Encounter (Signed)
Tonya from [facility] called and said they are not able to schedule the pt with the diagnosis used for pt's orders. Osteopetrosis and Post menopausal are not able to be used as the diagnosis.   Diagnosis of estrogen deficiency, Osteopenia or Hyperparathyroidism are able to be used to place orders.   Archie Patten also wanted to let the team know for the future that screenings can not be used either.   Archie Patten said she is able to be contacted with any questions at [phone] ex [phone]
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
## 2017-06-09 ENCOUNTER — Ambulatory Visit (INDEPENDENT_AMBULATORY_CARE_PROVIDER_SITE_OTHER): Payer: Medicare Other | Admitting: Internal Medicine

## 2017-06-09 ENCOUNTER — Telehealth: Payer: Self-pay | Admitting: Internal Medicine

## 2017-06-09 ENCOUNTER — Other Ambulatory Visit: Payer: Self-pay

## 2017-06-09 ENCOUNTER — Encounter: Payer: Self-pay | Admitting: Internal Medicine

## 2017-06-09 VITALS — BP 178/98 | HR 80 | Temp 98.6°F | Wt 186.0 lb

## 2017-06-09 DIAGNOSIS — M8580 Other specified disorders of bone density and structure, unspecified site: Secondary | ICD-10-CM

## 2017-06-09 DIAGNOSIS — Z780 Asymptomatic menopausal state: Secondary | ICD-10-CM | POA: Insufficient documentation

## 2017-06-09 DIAGNOSIS — M810 Age-related osteoporosis without current pathological fracture: Secondary | ICD-10-CM

## 2017-06-09 DIAGNOSIS — I10 Essential (primary) hypertension: Secondary | ICD-10-CM | POA: Diagnosis not present

## 2017-06-09 DIAGNOSIS — E1165 Type 2 diabetes mellitus with hyperglycemia: Secondary | ICD-10-CM | POA: Diagnosis not present

## 2017-06-09 DIAGNOSIS — E875 Hyperkalemia: Secondary | ICD-10-CM | POA: Diagnosis not present

## 2017-06-09 HISTORY — DX: Other specified disorders of bone density and structure, unspecified site: M85.80

## 2017-06-09 LAB — COMPREHENSIVE METABOLIC PANEL
A/G RATIO: 1.2 (ref 1.2–2.2)
ALBUMIN: 3.7 g/dL (ref 3.6–4.8)
ALT: 16 IU/L (ref 0–32)
AST: 19 IU/L (ref 0–40)
Alkaline Phosphatase: 96 IU/L (ref 39–117)
BUN / CREAT RATIO: 10 — AB (ref 12–28)
BUN: 8 mg/dL (ref 8–27)
Bilirubin Total: 0.2 mg/dL (ref 0.0–1.2)
CALCIUM: 9.6 mg/dL (ref 8.7–10.3)
CO2: 25 mmol/L (ref 20–29)
CREATININE: 0.78 mg/dL (ref 0.57–1.00)
Chloride: 96 mmol/L (ref 96–106)
GFR, EST AFRICAN AMERICAN: 92 mL/min/{1.73_m2} (ref >59)
GFR, EST NON AFRICAN AMERICAN: 80 mL/min/{1.73_m2} (ref >59)
GLOBULIN, TOTAL: 3.1 g/dL (ref 1.5–4.5)
Glucose: 533 mg/dL (ref 65–99)
POTASSIUM: 5.3 mmol/L — AB (ref 3.5–5.2)
SODIUM: 135 mmol/L (ref 134–144)
TOTAL PROTEIN: 6.8 g/dL (ref 6.0–8.5)

## 2017-06-09 LAB — LIPID PANEL
CHOL/HDL RATIO: 3.8 ratio (ref 0.0–4.4)
Cholesterol, Total: 132 mg/dL (ref 100–199)
HDL: 35 mg/dL — ABNORMAL LOW (ref >39)
LDL CALC: 67 mg/dL (ref 0–99)
Triglycerides: 149 mg/dL (ref 0–149)
VLDL Cholesterol Cal: 30 mg/dL (ref 5–40)

## 2017-06-09 LAB — GLUCOSE, POCT (MANUAL RESULT ENTRY): POC GLUCOSE: 375 mg/dL — AB (ref 70–99)

## 2017-06-09 MED ORDER — GLUCOSE BLOOD VI STRP: ORAL_STRIP | 12 refills | Status: DC

## 2017-06-09 MED ORDER — ONETOUCH VERIO W/DEVICE KIT: 1.0000 | PACK | Freq: Three times a day (TID) | 0 refills | Status: DC

## 2017-06-09 NOTE — Telephone Encounter (Signed)
Received page to Emergency After-Hours Line from [facility] with critical lab value. Glucose was 533 from CMP drawn the morning of 5/29. Asked for K, which was somewhat elevated at 5.3. Will discuss with resident who saw patient in clinic yesterday to determine if medications were adjusted yesterday or if patient had been out of her ozempic or metformin. (She was establishing care yesterday.) Plan per note was for further discussion of treatment options with Dr. Raymondo Band given A1c of 12.6 when drawn yesterday. This appointment is scheduled for 6/6.  Dani Gobble, MD [facility], PGY-3

## 2017-06-09 NOTE — Assessment & Plan Note (Addendum)
Patient's A1c is 12.6 today, indicating that her diabetes is uncontrolled.  Glucose measurements have been around 500 in past hospitalizations.  Patient says that she takes ozempic weekly (although she does not remember the dose) and metformin 1000 mg BID as prescribed.  She clearly would benefit from tighter control of her diabetes, so I will refer her to Dr. Macky Lower clinic for evaluation and initiation of further therapy.  Will obtain CMP today to evaluate kidney function.  Will have her return in three months for an A1c check and further health maintenance.

## 2017-06-09 NOTE — Progress Notes (Addendum)
[facility] Phone: [phone]   Date of Visit: 06/09/2017   HPI:  DM2 with elevated cbgs: - patient was seen in clinic yesterday and had blood work done. She was found to have elevated cbgs to 500s with mild elevation of anion gap to 14.  She was asked to come to clinic today for follow-up -Reports that she has been doing okay.  Denies any nausea, vomiting, abdominal pain.  Denies any polyuria or polydipsia. -Reports that she her glucometer has not been working for about 2 months. -She is currently on metformin 1000 mg twice daily.  She is also on semaglutide.  Until recently, she has been using samples provided by the clinic.  She reports she has not been taking this medication the entire month of May until today.  Her insurance approved this medication and she took her first dose today.  Prior to this she reports taking medication in April.  Hypertension: -Patient is only on carvedilol 25 mg twice daily. -She reports that in the past she was on multiple medications which caused her to have syncopal episodes.  Over time she was weaned down on multiple medications and has not had any syncopal episodes since being on carvedilol. -Yesterday at her visit her blood pressure was 140/80.  Prior to this, her last blood pressure reading was in September 2018 which was significantly elevated.  Prior to that last blood pressure check was in 2017 which is also significantly elevated. -She denies any chest pain, shortness of breath, headaches, blurred vision -She reports that she has a wrist blood pressure monitor.  Her blood pressures have been normal at home.  She reports that sometimes she even has low blood pressures with the top number in the 90s.  ROS: See HPI.  PMFSH:  Past medical history: Hypertension, type 2 diabetes, osteopenia, hyperlipidemia  PHYSICAL EXAM: BP (!) 178/98   Pulse 80   Temp 98.6 F (37 C) (Oral)   Wt 186 lb (84.4 kg)   SpO2 97%   BMI 32.95  kg/m  GEN: NAD HEENT: Atraumatic, normocephalic, neck supple, EOMI, sclera clear  CV: RRR, no murmurs, rubs, or gallops PULM: CTAB, normal effort ABD: Soft, nontender, nondistended, NABS, no organomegaly SKIN: No rash or cyanosis; warm and well-perfused EXTR: No lower extremity edema or calf tenderness PSYCH: Mood and affect euthymic, normal rate and volume of speech NEURO: Awake, alert, no focal deficits grossly, normal speech   ASSESSMENT/PLAN:  Uncontrolled type 2 diabetes mellitus with hyperglycemia (HCC) CBG in clinic today is 375.  Patient is well-appearing and asymptomatic.  She restarted semaglutide today.  Therefore, we will not start an additional medicine today.  Prescribed her a new glucometer with strips today.  Instructed her to check her CBGs 3 times a day and to keep a record.  She has an appointment with Dr. Raymondo Band on June 6.  Repeat BMP today  HTN (hypertension) Her blood pressure was persistently elevated on recheck.  It is odd that her blood pressure yesterday was within normal limits and elevated today.  We discussed starting a second agent at a low dose with patient.  Due to her syncopal episodes in the past she is not interested in this.  We discussed that she goes home and checks her blood pressure again tonight.  If her blood pressure is still elevated tonight, she is to make an appointment for nurse visit tomorrow for blood pressure check.  If she truly does have high and low blood  pressures, she would benefit from ambulatory blood pressure monitoring.  ED precautions discussed.  Of note, bus voucher provided to patient .   Palma Holter, MD PGY  [facility]

## 2017-06-09 NOTE — Patient Instructions (Addendum)
1) please check your sugar three times a day. I sent in a prescription for your meter.   Diet Recommendations for Diabetes   Starchy (carb) foods include: Bread, rice, pasta, potatoes, corn, crackers, bagels, muffins, all baked goods.  (Fruits, milk, and yogurt also have carbohydrate, but most of these foods will not spike your blood sugar as the starchy foods will.)  A few fruits do cause high blood sugars; use small portions of bananas (limit to 1/2 at a time), grapes, and most tropical fruits.    Protein foods include: Meat, fish, poultry, eggs, dairy foods, and beans such as pinto and kidney beans (beans also provide carbohydrate).   1. Eat at least 3 meals and 1-2 snacks per day. Never go more than 4-5 hours while awake without eating.  2. Limit starchy foods to TWO per meal and ONE per snack. ONE portion of a starchy  food is equal to the following:   - ONE slice of bread (or its equivalent, such as half of a hamburger bun).   - 1/2 cup of a "scoopable" starchy food such as potatoes or rice.   - 15 grams of carbohydrate as shown on food label.  3. Both lunch and dinner should include a protein food, a carb food, and vegetables.   - Obtain twice as many veg's as protein or carbohydrate foods for both lunch and dinner.   - Fresh or frozen veg's are best.   - Try to keep frozen veg's on hand for a quick vegetable serving.    4. Breakfast should always include protein.

## 2017-06-09 NOTE — Assessment & Plan Note (Signed)
Her blood pressure was persistently elevated on recheck.  It is odd that her blood pressure yesterday was within normal limits and elevated today.  We discussed starting a second agent at a low dose with patient.  Due to her syncopal episodes in the past she is not interested in this.  We discussed that she goes home and checks her blood pressure again tonight.  If her blood pressure is still elevated tonight, she is to make an appointment for nurse visit tomorrow for blood pressure check.  If she truly does have high and low blood pressures, she would benefit from ambulatory blood pressure monitoring.  ED precautions discussed.

## 2017-06-09 NOTE — Assessment & Plan Note (Signed)
Will refer to [facility] for DEXA scan today.

## 2017-06-09 NOTE — Assessment & Plan Note (Addendum)
CBG in clinic today is 375.  Patient is well-appearing and asymptomatic.  She restarted semaglutide today.  Therefore, we will not start an additional medicine today.  Prescribed her a new glucometer with strips today.  Instructed her to check her CBGs 3 times a day and to keep a record.  She has an appointment with Dr. Raymondo Band on June 6.  Repeat BMP today

## 2017-06-09 NOTE — Telephone Encounter (Signed)
Called patient to discuss lab results. Glucose of 533. CMP did show slight anion gap of 14. Patient reports feeling well. She has not checked her CBG since last week. Asked her if she could check while we were on phone together. She attempted but said she could not get a reading due to "not enough blood" and concern she didn't have the right needle. She said she was due to take her ozempic today and would take this morning. Scheduled patient for same day appointment this afternoon to ensure CBG has improved and to make sure meter is working. Would consider starting insulin today versus waiting for appointment with Dr. Raymondo Band next week for fuller instruction in how to take this, as she likely chronically runs high.   Of note, patient says she will need a bus voucher today in order to get home from appointment.   Dani Gobble, MD [facility], PGY-3

## 2017-06-10 LAB — BASIC METABOLIC PANEL
BUN / CREAT RATIO: 15 (ref 12–28)
BUN: 11 mg/dL (ref 8–27)
CALCIUM: 10.1 mg/dL (ref 8.7–10.3)
CHLORIDE: 95 mmol/L — AB (ref 96–106)
CO2: 26 mmol/L (ref 20–29)
Creatinine, Ser: 0.75 mg/dL (ref 0.57–1.00)
GFR calc non Af Amer: 84 mL/min/{1.73_m2} (ref >59)
GFR, EST AFRICAN AMERICAN: 97 mL/min/{1.73_m2} (ref >59)
GLUCOSE: 345 mg/dL — AB (ref 65–99)
POTASSIUM: 4.9 mmol/L (ref 3.5–5.2)
Sodium: 137 mmol/L (ref 134–144)

## 2017-06-16 ENCOUNTER — Encounter: Payer: Self-pay | Admitting: Pharmacist

## 2017-06-16 ENCOUNTER — Ambulatory Visit (INDEPENDENT_AMBULATORY_CARE_PROVIDER_SITE_OTHER): Payer: Medicare Other | Admitting: Pharmacist

## 2017-06-16 DIAGNOSIS — I10 Essential (primary) hypertension: Secondary | ICD-10-CM

## 2017-06-16 DIAGNOSIS — E1165 Type 2 diabetes mellitus with hyperglycemia: Secondary | ICD-10-CM | POA: Diagnosis not present

## 2017-06-16 MED ORDER — ACCU-CHEK SOFT TOUCH LANCETS MISC: 12 refills | Status: DC

## 2017-06-16 MED ORDER — GLUCOSE BLOOD VI STRP
ORAL_STRIP | 12 refills | Status: DC
Start: 2017-06-16 — End: 2020-07-17

## 2017-06-16 MED ORDER — ACCU-CHEK SOFTCLIX LANCET DEV KIT: 1.0000 | PACK | Freq: Once | 0 refills | Status: AC

## 2017-06-16 NOTE — Assessment & Plan Note (Signed)
Hypertension longstanding for many years currently uncontrolled on carvedilol monotherapy.  BP goal = 140/90 mmHg due to h/o syncopal events and multiple intolerances to blood pressure medications. Patient is adherent with medication. Control is suboptimal due to limited pharmacotherapy options, dietary sodium intake, obesity. -No change to medications today -Discussed starting amlodipine 2.5mg  at next visit considering she did not tolerate amlodipine 5mg  in the past.

## 2017-06-16 NOTE — Progress Notes (Signed)
    S:     Chief Complaint  Patient presents with  . Medication Management    diabetes   Patient arrives in good spirits and ambulates without assistance.  Presents for diabetes evaluation, education, and management at the request of Dr. Frances Furbish. Patient was referred on 06/08/2017.  Patient was last seen by Primary Care Provider on 06/08/2017.   Patient reports Diabetes was diagnosed approximately 20 years ago.    Family/Social History: non-contributory  Insurance coverage/medication affordability: Now has Medicaid coverage in addition to Medicare plan [facility])  Patient reports adherence with medications.  Current diabetes medications include: Metformin 1000mg  twice daily AND Semaglutide 0.25mg  weekly.  Current hypertension medications include: Carvedilol 25mg  BID  Patient denies hypoglycemic events.  Patient reported dietary habits: Eats 2 meals/day Breakfast: none Lunch: bigger meal of the day Dinner: smaller meal of the day Snacks: 1 per day  Patient-reported exercise habits: minimal activity, but recently walked a lot on vacation with granddaughter.   Patient reports nocturia has decreased from 2 to 1 or less/night with restart of the Ozempic. [place]:  Physical Exam  Constitutional: She appears well-developed and well-nourished.   Review of Systems  Gastrointestinal: Negative for nausea and vomiting.  All other systems reviewed and are negative.  Lab Results  Component Value Date   HGBA1C 12.6 (A) 06/08/2017   Vitals:   06/16/17 1009  BP: (!) 152/84  Pulse: 87  SpO2: 98%    Lipid Panel     Component Value Date/Time   CHOL 132 06/08/2017 1110   TRIG 149 06/08/2017 1110   HDL 35 (L) 06/08/2017 1110   CHOLHDL 3.8 06/08/2017 1110   LDLCALC 67 06/08/2017 1110   Home fasting CBG: 140-160s.  Clinical ASCVD: No   A/P: Diabetes longstanding for 20 years currently improved on current regimen of metformin and semaglutide. Patient is able to verbalize appropriate  hypoglycemia management plan. Patient is adherent with medication. Control is suboptimal due to unoptimized dosing of semaglutide however patient previously had intolerance to 0.5 mg dose.  -Continued GLP-1 0.25mg  dose (generic name semaglutide) with goal of increasing dose by "1 click" above 0.25 mg dose each week until max tolerated dose is foun.   -Counseled on s/sx of and management of hypoglycemia -Sent CBG monitoring supplies -Nex A1C due 09/08/17 or later  Hypertension longstanding for many years currently uncontrolled on carvedilol monotherapy.  BP goal = 140/90 mmHg due to h/o syncopal events and multiple intolerances to blood pressure medications. Patient is adherent with medication. Control is suboptimal due to limited pharmacotherapy options, dietary sodium intake, obesity. -No change to medications today -Discussed starting amlodipine 2.5mg  at next visit considering she did not tolerate amlodipine 5mg  in the past.  Written patient instructions provided.  Total time in face to face counseling 45 minutes.   Follow up Pharmacist Clinic Visit in a month, July. Patient seen with Donnella Bi, PharmD, PGY1 Pharmacy Resident.

## 2017-06-16 NOTE — Patient Instructions (Addendum)
Nice to meet you today.   Continue Metformin twice daily at current dose.   Increase your Ozempic by 1 click higher weekly until your blood sugar numbers are a little lower.   Please stop increasing your dose if your blood sugar is near 100.    Please follow up in 1 month in pharmacy clinic.

## 2017-06-16 NOTE — Assessment & Plan Note (Signed)
Diabetes longstanding for 20 years currently improved on current regimen of metformin and semaglutide. Patient is able to verbalize appropriate hypoglycemia management plan. Patient is adherent with medication. Control is suboptimal due to unoptimized dosing of semaglutide however patient previously had intolerance to 0.5 mg dose.  -Continued GLP-1 0.25mg  dose (generic name semaglutide) with goal of increasing dose by "1 click" above 0.25 mg dose each week until max tolerated dose is foun.   -Counseled on s/sx of and management of hypoglycemia -Sent CBG monitoring supplies -Nex A1C due 09/08/17 or later

## 2017-09-06 ENCOUNTER — Encounter: Payer: Self-pay | Admitting: Family Medicine

## 2017-09-06 ENCOUNTER — Ambulatory Visit (INDEPENDENT_AMBULATORY_CARE_PROVIDER_SITE_OTHER): Payer: Medicare Other | Admitting: Family Medicine

## 2017-09-06 ENCOUNTER — Other Ambulatory Visit: Payer: Self-pay

## 2017-09-06 VITALS — BP 168/94 | HR 89 | Temp 98.2°F | Ht 63.0 in | Wt 186.0 lb

## 2017-09-06 DIAGNOSIS — Z23 Encounter for immunization: Secondary | ICD-10-CM

## 2017-09-06 DIAGNOSIS — Z0000 Encounter for general adult medical examination without abnormal findings: Secondary | ICD-10-CM

## 2017-09-06 DIAGNOSIS — I10 Essential (primary) hypertension: Secondary | ICD-10-CM | POA: Diagnosis not present

## 2017-09-06 DIAGNOSIS — E1165 Type 2 diabetes mellitus with hyperglycemia: Secondary | ICD-10-CM

## 2017-09-06 LAB — POCT GLYCOSYLATED HEMOGLOBIN (HGB A1C): HBA1C, POC (CONTROLLED DIABETIC RANGE): 8.8 % — AB (ref 0.0–7.0)

## 2017-09-06 LAB — POCT UA - MICROALBUMIN
CREATININE, POC: 200 mg/dL
MICROALBUMIN (UR) POC: 80 mg/L

## 2017-09-06 MED ORDER — ATORVASTATIN CALCIUM 20 MG PO TABS: 10.0000 mg | ORAL_TABLET | Freq: Every day | ORAL | 11 refills | Status: DC

## 2017-09-06 MED ORDER — OLMESARTAN MEDOXOMIL 5 MG PO TABS: 5.0000 mg | ORAL_TABLET | Freq: Every day | ORAL | 11 refills | Status: DC

## 2017-09-06 NOTE — Progress Notes (Signed)
Subjective:    [name] [name] - 67 y.o. female MRN [id number]  Date of birth: [birth year]  HPI  [name] [name] is here for diabetes and blood pressure follow up.  Diabetes - has increased Ozempic dose by 5 clicks (patient is unsure of her exact dose currently); has tried to go up more, but quantity was too large and would collect in her subcutaneous tissue -Continues to take metformin 1000 mg twice daily - fasting blood sugars are around 150-170 -No hypoglycemic events -Continues to walk daily  Hypertension - Has recorded her blood pressures daily, with most values in the 130s to 150s systolic and 70s to 80s diastolic - Continues to take Coreg 25 mg twice daily  Health Maintenance:  -Agreeable to flu shot and hepatitis C screening today.  Also agreeable to urine microalbumin. Health Maintenance Due  Topic Date Due  . FOOT EXAM  11/30/1961  . OPHTHALMOLOGY EXAM  11/30/1961  . COLONOSCOPY  11/30/2001  . DEXA SCAN  11/30/2016    -  reports that she has never smoked. She has never used smokeless tobacco. - Review of Systems: Per HPI. - Past Medical History: Patient Active Problem List   Diagnosis Date Noted  . Healthcare maintenance 09/07/2017  . Osteopenia after menopause 06/09/2017  . Uncontrolled type 2 diabetes mellitus with hyperglycemia (HCC) 06/08/2017  . Back pain 11/15/2014  . Pericardial effusion 05/12/2010  . HTN (hypertension) 05/12/2010  . Mixed hyperlipidemia 05/12/2010   - Medications: reviewed and updated   Objective:   Physical Exam BP (!) 168/94   Pulse 89   Temp 98.2 F (36.8 C) (Oral)   Ht 5\' 3"  (1.6 m)   Wt 186 lb (84.4 kg)   SpO2 97%   BMI 32.95 kg/m  Gen: NAD, alert, cooperative with exam, obese CV: RRR, good S1/S2, no murmur Resp: CTABL, no wheezes, non-labored Abd: SNTND, BS present, no guarding or organomegaly Psych: Fair insight and judgment, alert and oriented        Assessment & Plan:   Uncontrolled type 2 diabetes  mellitus with hyperglycemia (HCC) A1c 8.8 today, much improved from 12.2 three months ago.  Patient congratulated on her progress.  Will continue current therapy and change therapy as needed if A1c has not improved at her next visit.  Goal A1c for her age group is less than 8.  Reviewed patient's ASCVD risk with her, which gives her a 20% chance of developing heart attack or stroke in the next 10 years.  Encourage patient to consider increasing her Lipitor dose from 10 mg to 40 mg so that she would be on a high intensity statin.  Patient was very wary of increasing her dose of this medication and did not think a 20% chance of heart attack or stroke was very high.  After discussion, we decided to increase her Lipitor from 10 mg to 20 mg daily.  HTN (hypertension) Patient continues to be hypertensive, both today and on home readings.  Consequences of prolonged hypertension reviewed with the patient, including increased risk for heart attack and stroke as well as kidney disease.  We will continue carvedilol, but patient would benefit from addition of ACE inhibitor or are, especially given her diabetes diagnosis.  Patient was again very wary of starting a new medication, but the benefits of this medication were described to her, and patient was amenable to starting a low dose.  We will start Benicar 5 mg daily.  Healthcare maintenance Flu shot given today, hepatitis  C screen performed, and urine microalbumin collected.    Lezlie Octave, M.D. 09/07/2017, 11:43 AM PGY-2, [facility]

## 2017-09-06 NOTE — Progress Notes (Signed)
poct

## 2017-09-06 NOTE — Patient Instructions (Signed)
It was nice meeting you today Ms. Pyles!  Today, we are changing your Lipitor dose to 20 mg from 10 mg.  This is to prevent heart attack and stroke in the future.    We are also starting a medication called olmesartan for your blood pressure.  Please take this once per day.  Please call us if you have trouble with this medication.  Congratulations on your improvement in your diabetes!  Please continue the metformin and ozempic - you are doing a great job!  If you have any questions or concerns, please feel free to call the clinic.   Be well,  Dr. Frances Furbish

## 2017-09-07 DIAGNOSIS — Z0000 Encounter for general adult medical examination without abnormal findings: Secondary | ICD-10-CM | POA: Insufficient documentation

## 2017-09-07 LAB — HEPATITIS C ANTIBODY

## 2017-09-07 NOTE — Assessment & Plan Note (Signed)
A1c 8.8 today, much improved from 12.2 three months ago.  Patient congratulated on her progress.  Will continue current therapy and change therapy as needed if A1c has not improved at her next visit.  Goal A1c for her age group is less than 8.  Reviewed patient's ASCVD risk with her, which gives her a 20% chance of developing heart attack or stroke in the next 10 years.  Encourage patient to consider increasing her Lipitor dose from 10 mg to 40 mg so that she would be on a high intensity statin.  Patient was very wary of increasing her dose of this medication and did not think a 20% chance of heart attack or stroke was very high.  After discussion, we decided to increase her Lipitor from 10 mg to 20 mg daily.

## 2017-09-07 NOTE — Assessment & Plan Note (Signed)
Flu shot given today, hepatitis C screen performed, and urine microalbumin collected.

## 2017-09-07 NOTE — Assessment & Plan Note (Signed)
Patient continues to be hypertensive, both today and on home readings.  Consequences of prolonged hypertension reviewed with the patient, including increased risk for heart attack and stroke as well as kidney disease.  We will continue carvedilol, but patient would benefit from addition of ACE inhibitor or are, especially given her diabetes diagnosis.  Patient was again very wary of starting a new medication, but the benefits of this medication were described to her, and patient was amenable to starting a low dose.  We will start Benicar 5 mg daily.

## 2017-09-09 ENCOUNTER — Ambulatory Visit
Admission: RE | Admit: 2017-09-09 | Discharge: 2017-09-09 | Disposition: A | Discharge status: Home / Self Care | Payer: Medicare Other | Source: Ambulatory Visit | Attending: Nurse Practitioner | Admitting: Nurse Practitioner

## 2017-09-09 ENCOUNTER — Other Ambulatory Visit: Payer: Self-pay | Admitting: Family Medicine

## 2017-09-09 ENCOUNTER — Ambulatory Visit: Payer: Medicare Other

## 2017-09-09 ENCOUNTER — Ambulatory Visit
Admission: RE | Admit: 2017-09-09 | Discharge: 2017-09-09 | Disposition: A | Discharge status: Home / Self Care | Payer: Medicare Other | Source: Ambulatory Visit | Attending: Family Medicine | Admitting: Family Medicine

## 2017-09-09 DIAGNOSIS — Z1231 Encounter for screening mammogram for malignant neoplasm of breast: Secondary | ICD-10-CM

## 2017-09-09 DIAGNOSIS — M810 Age-related osteoporosis without current pathological fracture: Principal | ICD-10-CM

## 2017-09-09 DIAGNOSIS — Z780 Asymptomatic menopausal state: Secondary | ICD-10-CM

## 2017-09-13 ENCOUNTER — Encounter: Payer: Self-pay | Admitting: Family Medicine

## 2017-10-11 ENCOUNTER — Other Ambulatory Visit: Payer: Self-pay | Admitting: Nurse Practitioner

## 2017-11-13 ENCOUNTER — Other Ambulatory Visit: Payer: Self-pay | Admitting: Nurse Practitioner

## 2017-11-14 ENCOUNTER — Other Ambulatory Visit: Payer: Self-pay | Admitting: Nurse Practitioner

## 2017-11-21 ENCOUNTER — Other Ambulatory Visit: Payer: Self-pay

## 2017-11-22 ENCOUNTER — Other Ambulatory Visit: Payer: Self-pay | Admitting: Nurse Practitioner

## 2017-12-19 ENCOUNTER — Other Ambulatory Visit: Payer: Self-pay | Admitting: Nurse Practitioner

## 2017-12-30 ENCOUNTER — Ambulatory Visit (INDEPENDENT_AMBULATORY_CARE_PROVIDER_SITE_OTHER): Payer: Medicare Other | Admitting: Family Medicine

## 2017-12-30 ENCOUNTER — Encounter: Payer: Self-pay | Admitting: Family Medicine

## 2017-12-30 ENCOUNTER — Other Ambulatory Visit: Payer: Self-pay

## 2017-12-30 VITALS — BP 144/86 | HR 85 | Temp 98.6°F | Ht 63.0 in | Wt 189.4 lb

## 2017-12-30 DIAGNOSIS — R21 Rash and other nonspecific skin eruption: Secondary | ICD-10-CM | POA: Diagnosis not present

## 2017-12-30 DIAGNOSIS — E1165 Type 2 diabetes mellitus with hyperglycemia: Secondary | ICD-10-CM | POA: Diagnosis not present

## 2017-12-30 LAB — POCT GLYCOSYLATED HEMOGLOBIN (HGB A1C): HBA1C, POC (CONTROLLED DIABETIC RANGE): 9.4 % — AB (ref 0.0–7.0)

## 2017-12-30 MED ORDER — OZEMPIC (0.25 OR 0.5 MG/DOSE) 2 MG/1.5ML ~~LOC~~ SOPN: 0.5000 mg | PEN_INJECTOR | SUBCUTANEOUS | 3 refills | Status: DC

## 2017-12-30 MED ORDER — NYSTATIN 100000 UNIT/GM EX CREA: TOPICAL_CREAM | CUTANEOUS | 2 refills | Status: DC

## 2017-12-30 NOTE — Assessment & Plan Note (Signed)
Appears to be healing with residual hyperpigmentation.  Will refill nystatin since patient thinks that this is been helping.

## 2017-12-30 NOTE — Assessment & Plan Note (Signed)
Patient's A1c has risen from 8.8-9.4 today, so we will increase her Ozempic dose from 0.25 mg to 0.5 mg weekly.  Patient was agreeable to this since it is only 1 step up in her dose.  Patient was counseled that neither Ozempic nor metformin is known to cause hypoglycemia, so she should be less concerned about this.  She was also told that if the increased Ozempic dose does not work for her, we can always add another medication that would not cause hypoglycemia, such as an SGLT2 inhibitor.  She was encouraged to continue to work on reducing her consumption of starches and sweets.

## 2017-12-30 NOTE — Patient Instructions (Signed)
It was nice seeing you today [name] [name]!  For your diabetes, let us increase your Ozempic by 1 click, since I would like to get your A1c a bit lower.  Please continue to watch the amount of sugary and starchy foods you eat.  I would like to see you back in 3 months to follow-up on your diabetes.  I have sent in more nystatin cream for you.  I am glad it is working well.  If you have any questions or concerns, please feel free to call the clinic.   Be well,  Dr. Frances Furbish

## 2017-12-30 NOTE — Progress Notes (Signed)
   Subjective:    [name] [name] - 67 y.o. female MRN [id number]  Date of birth: Mar 11, 1950  CC:  [name] [name] is here for diabetes follow up.  She would   HPI: Diabetes - takes ozempic once weekly and metformin twice daily - blood sugars: mid-100s, ranging from 90-190 fasting - hypoglycemic events: has felt lightheaded, nauseated, and sleepy, occurred as recent as yesterday, occur about once per week, has had blood sugars as low as 30 before - eats about every 2-3 hours - worried that her body will "reject" higher doses of Ozempic, since she has developed nodules where she has injected medicine before  Rash under breasts - improved with nystatin cream - would like a refill of this   Health Maintenance:  Health Maintenance Due  Topic Date Due  . FOOT EXAM  11/30/1961  . OPHTHALMOLOGY EXAM  11/30/1961  . COLONOSCOPY  11/30/2001    -  reports that she has never smoked. She has never used smokeless tobacco. - Review of Systems: Per HPI. - Past Medical History: Patient Active Problem List   Diagnosis Date Noted  . Healthcare maintenance 09/07/2017  . Osteopenia after menopause 06/09/2017  . Uncontrolled type 2 diabetes mellitus with hyperglycemia (HCC) 06/08/2017  . Back pain 11/15/2014  . Pericardial effusion 05/12/2010  . HTN (hypertension) 05/12/2010  . Mixed hyperlipidemia 05/12/2010   - Medications: reviewed and updated   Objective:   Physical Exam BP (!) 144/86 (BP Location: Right Arm)   Pulse 85   Temp 98.6 F (37 C) (Oral)   Ht 5\' 3"  (1.6 m)   Wt 189 lb 6.4 oz (85.9 kg)   SpO2 98%   BMI 33.55 kg/m  Gen: NAD, alert, cooperative with exam, well-appearing CV: RRR, good S1/S2, no murmur, no edema Resp: CTABL, no wheezes, non-labored Skin: Hyperpigmented areas under bilateral breasts     Assessment & Plan:   No problem-specific Assessment & Plan notes found for this encounter.    Lezlie Octave, M.D. 12/30/2017, 10:29 AM PGY-2, [facility]  Family Medicine

## 2018-01-14 ENCOUNTER — Other Ambulatory Visit: Payer: Self-pay | Admitting: Nurse Practitioner

## 2018-02-02 ENCOUNTER — Telehealth: Payer: Self-pay

## 2018-02-02 NOTE — Telephone Encounter (Signed)
Received fax from pharmacy, PA needed on Ozempic. Clinical questions submitted via Cover My Meds.  Response received: This medication or product is on your plan's list of covered drugs. Prior authorization is not required at this time. If your pharmacy has questions regarding the processing of your prescription, please have them call the [facility] pharmacy help desk at [phone].  [facility] notified via voicemail.  Cover My Meds info: Key: [id number]  Ples Specter, RN [facility] RN)

## 2018-02-12 ENCOUNTER — Other Ambulatory Visit: Payer: Self-pay | Admitting: Nurse Practitioner

## 2018-02-21 ENCOUNTER — Other Ambulatory Visit: Payer: Self-pay | Admitting: Nurse Practitioner

## 2018-02-21 ENCOUNTER — Other Ambulatory Visit: Payer: Self-pay | Admitting: Internal Medicine

## 2018-05-17 ENCOUNTER — Other Ambulatory Visit: Payer: Self-pay | Admitting: Nurse Practitioner

## 2018-06-02 ENCOUNTER — Other Ambulatory Visit: Payer: Self-pay | Admitting: *Deleted

## 2018-06-02 MED ORDER — OZEMPIC (0.25 OR 0.5 MG/DOSE) 2 MG/1.5ML ~~LOC~~ SOPN: 0.5000 mg | PEN_INJECTOR | SUBCUTANEOUS | 3 refills | Status: DC

## 2018-07-07 ENCOUNTER — Other Ambulatory Visit: Payer: Self-pay | Admitting: Nurse Practitioner

## 2018-08-11 ENCOUNTER — Other Ambulatory Visit: Payer: Self-pay | Admitting: Family Medicine

## 2018-08-14 ENCOUNTER — Other Ambulatory Visit: Payer: Self-pay | Admitting: Family Medicine

## 2018-08-18 ENCOUNTER — Other Ambulatory Visit: Payer: Self-pay | Admitting: Family Medicine

## 2018-08-20 ENCOUNTER — Other Ambulatory Visit: Payer: Self-pay | Admitting: Nurse Practitioner

## 2018-08-24 ENCOUNTER — Other Ambulatory Visit: Payer: Self-pay | Admitting: Family Medicine

## 2018-11-03 ENCOUNTER — Other Ambulatory Visit: Payer: Self-pay | Admitting: Family Medicine

## 2018-11-03 ENCOUNTER — Other Ambulatory Visit: Payer: Self-pay | Admitting: Nurse Practitioner

## 2018-11-17 ENCOUNTER — Other Ambulatory Visit: Payer: Self-pay | Admitting: Family Medicine

## 2019-01-24 ENCOUNTER — Other Ambulatory Visit: Payer: Self-pay

## 2019-01-24 ENCOUNTER — Ambulatory Visit (INDEPENDENT_AMBULATORY_CARE_PROVIDER_SITE_OTHER): Payer: Medicare Other

## 2019-01-24 DIAGNOSIS — Z0000 Encounter for general adult medical examination without abnormal findings: Secondary | ICD-10-CM

## 2019-01-24 NOTE — Patient Instructions (Addendum)
You spoke to [name] [name], CMA over the phone for your annual wellness visit.  We discussed goals: Goals    . Patient Stated     Maintain lifestyle      We also discussed recommended health maintenance. . As discussed, you are due for a few things, see below for reference. You have a virtual apt with PCP on 1/20 to further discuss.   Health Maintenance  Topic Date Due  . FOOT EXAM  11/30/1961  . OPHTHALMOLOGY EXAM  11/30/1961  . COLONOSCOPY  11/30/2001  . PNA vac Low Risk Adult (2 of 2 - PPSV23) 05/14/2018  . HEMOGLOBIN A1C  07/01/2018  . INFLUENZA VACCINE  08/12/2018  . MAMMOGRAM  09/10/2019  . TETANUS/TDAP  05/14/2027  . DEXA SCAN  Completed  . Hepatitis C Screening  Completed   Keep up the good work walking and keeping a positive attitude during the Pandemic. When you are interested in an advance directive let us know. You can get your flu vaccine at your local pharmacy.  You are due for you mammogram this year.  Consider getting a FU colonoscopy. Keep virtual apt with PCP on 1/20 @3 :00pm   Health Maintenance, Female Adopting a healthy lifestyle and getting preventive care are important in promoting health and wellness. Ask your health care provider about:  The right schedule for you to have regular tests and exams.  Things you can do on your own to prevent diseases and keep yourself healthy. What should I know about diet, weight, and exercise? Eat a healthy diet   Eat a diet that includes plenty of vegetables, fruits, low-fat dairy products, and lean protein.  Do not eat a lot of foods that are high in solid fats, added sugars, or sodium. Maintain a healthy weight Body mass index (BMI) is used to identify weight problems. It estimates body fat based on height and weight. Your health care provider can help determine your BMI and help you achieve or maintain a healthy weight. Get regular exercise Get regular exercise. This is one of the most important things you can  do for your health. Most adults should:  Exercise for at least 150 minutes each week. The exercise should increase your heart rate and make you sweat (moderate-intensity exercise).  Do strengthening exercises at least twice a week. This is in addition to the moderate-intensity exercise.  Spend less time sitting. Even light physical activity can be beneficial. Watch cholesterol and blood lipids Have your blood tested for lipids and cholesterol at 68 years of age, then have this test every 5 years. Have your cholesterol levels checked more often if:  Your lipid or cholesterol levels are high.  You are older than 68 years of age.  You are at high risk for heart disease. What should I know about cancer screening? Depending on your health history and family history, you may need to have cancer screening at various ages. This may include screening for:  Breast cancer.  Cervical cancer.  Colorectal cancer.  Skin cancer.  Lung cancer. What should I know about heart disease, diabetes, and high blood pressure? Blood pressure and heart disease  High blood pressure causes heart disease and increases the risk of stroke. This is more likely to develop in people who have high blood pressure readings, are of African descent, or are overweight.  Have your blood pressure checked: ? Every 3-5 years if you are 66-56 years of age. ? Every year if you are 38 years old  or older. Diabetes Have regular diabetes screenings. This checks your fasting blood sugar level. Have the screening done:  Once every three years after age 17 if you are at a normal weight and have a low risk for diabetes.  More often and at a younger age if you are overweight or have a high risk for diabetes. What should I know about preventing infection? Hepatitis B If you have a higher risk for hepatitis B, you should be screened for this virus. Talk with your health care provider to find out if you are at risk for hepatitis B  infection. Hepatitis C Testing is recommended for:  Everyone born from [birth year] through 1964.  Anyone with known risk factors for hepatitis C. Sexually transmitted infections (STIs)  Get screened for STIs, including gonorrhea and chlamydia, if: ? You are sexually active and are younger than 68 years of age. ? You are older than 68 years of age and your health care provider tells you that you are at risk for this type of infection. ? Your sexual activity has changed since you were last screened, and you are at increased risk for chlamydia or gonorrhea. Ask your health care provider if you are at risk.  Ask your health care provider about whether you are at high risk for HIV. Your health care provider may recommend a prescription medicine to help prevent HIV infection. If you choose to take medicine to prevent HIV, you should first get tested for HIV. You should then be tested every 3 months for as long as you are taking the medicine. Pregnancy  If you are about to stop having your period (premenopausal) and you may become pregnant, seek counseling before you get pregnant.  Take 400 to 800 micrograms (mcg) of folic acid every day if you become pregnant.  Ask for birth control (contraception) if you want to prevent pregnancy. Osteoporosis and menopause Osteoporosis is a disease in which the bones lose minerals and strength with aging. This can result in bone fractures. If you are 60 years old or older, or if you are at risk for osteoporosis and fractures, ask your health care provider if you should:  Be screened for bone loss.  Take a calcium or vitamin D supplement to lower your risk of fractures.  Be given hormone replacement therapy (HRT) to treat symptoms of menopause. Follow these instructions at home: Lifestyle  Do not use any products that contain nicotine or tobacco, such as cigarettes, e-cigarettes, and chewing tobacco. If you need help quitting, ask your health care  provider.  Do not use street drugs.  Do not share needles.  Ask your health care provider for help if you need support or information about quitting drugs. Alcohol use  Do not drink alcohol if: ? Your health care provider tells you not to drink. ? You are pregnant, may be pregnant, or are planning to become pregnant.  If you drink alcohol: ? Limit how much you use to 0-1 drink a day. ? Limit intake if you are breastfeeding.  Be aware of how much alcohol is in your drink. In the [place] one drink equals one 12 oz bottle of beer (355 mL), one 5 oz glass of wine (148 mL), or one 1 oz glass of hard liquor (44 mL). General instructions  Schedule regular health, dental, and eye exams.  Stay current with your vaccines.  Tell your health care provider if: ? You often feel depressed. ? You have ever been abused or do not  feel safe at home. Summary  Adopting a healthy lifestyle and getting preventive care are important in promoting health and wellness.  Follow your health care provider's instructions about healthy diet, exercising, and getting tested or screened for diseases.  Follow your health care provider's instructions on monitoring your cholesterol and blood pressure. This information is not intended to replace advice given to you by your health care provider. Make sure you discuss any questions you have with your health care provider. Document Revised: 12/21/2017 Document Reviewed: 12/21/2017 Elsevier Patient Education  2020 [facility]'s number is [phone]. Please call with questions or concerns about what we discussed today.

## 2019-01-24 NOTE — Progress Notes (Addendum)
Subjective:   [name] [name] is a 69 y.o. female who presents for Medicare Annual (Subsequent) preventive examination.  The patient consented to a virtual visit.  Review of Systems: Defer to PCP  Cardiac Risk Factors include: advanced age (>12men, >27 women);diabetes mellitus;hypertension  Objective:   Vitals: There were no vitals taken for this visit.  There is no height or weight on file to calculate BMI.  Advanced Directives 01/24/2019 09/06/2017 09/06/2017 06/09/2017 06/08/2017 07/20/2015 10/09/2014  Does Patient Have a Medical Advance Directive? No No No No No No No  Would patient like information on creating a medical advance directive? No - Patient declined No - Patient declined No - Patient declined No - Patient declined No - Patient declined No - patient declined information No - patient declined information   Tobacco Social History   Tobacco Use  Smoking Status Never Smoker  Smokeless Tobacco Never Used     Counseling given: Not Answered  Clinical Intake:  Pre-visit preparation completed: Yes  How often do you need to have someone help you when you read instructions, pamphlets, or other written materials from your doctor or pharmacy?: 1 - Never  Past Medical History:  Diagnosis Date  . Abdominal pain   . Back pain   . Constipation   . Fatigue   . Full dentures   . Hemorrhoids   . Hypertension   . Lack of adequate sleep   . Liver mass   . Rash    on right breast  . Wears glasses    Past Surgical History:  Procedure Laterality Date  . ABDOMINAL HYSTERECTOMY    . BREAST DUCTAL SYSTEM EXCISION    . BREAST EXCISIONAL BIOPSY Left   . CHOLECYSTECTOMY, LAPAROSCOPIC  07/24/2010  . ESOPHAGEAL DILATION     Family History  Problem Relation Age of Onset  . Stroke Mother   . Other Father        broken heart after spouse spouse  . Diabetes Sister   . Hypertension Sister   . Diabetes Sister   . Multiple sclerosis Sister   . Alcohol abuse Other   . Diabetes  Other   . Breast cancer Neg Hx    Social History   Socioeconomic History  . Marital status: Single    Spouse name: Not on file  . Number of children: 3  . Years of education: Not on file  . Highest education level: High school graduate  Occupational History  . Occupation: Retired    Fish farm manager: OTHER    Comment: CNA  Tobacco Use  . Smoking status: Never Smoker  . Smokeless tobacco: Never Used  Substance and Sexual Activity  . Alcohol use: No    Alcohol/week: 0.0 standard drinks  . Drug use: No  . Sexual activity: Not Currently  Other Topics Concern  . Not on file  Social History Narrative   Patient lives alone in an apartment complex for seniors.   Patient does not drive. She walks to surrounding places near her complex or her children run errands for her. Patient never goes without.   Patient enjoys sowing, crafts, shopping, and walking.    Patient has 3 children and she is close with all of them.        Social Determinants of Health   Financial Resource Strain: Low Risk   . Difficulty of Paying Living Expenses: Not hard at all  Food Insecurity: No Food Insecurity  . Worried About Charity fundraiser in the Last  Year: Never true  . Ran Out of Food in the Last Year: Never true  Transportation Needs: No Transportation Needs  . Lack of Transportation (Medical): No  . Lack of Transportation (Non-Medical): No  Physical Activity: Sufficiently Active  . Days of Exercise per Week: 7 days  . Minutes of Exercise per Session: 60 min  Stress: No Stress Concern Present  . Feeling of Stress : Not at all  Social Connections: Somewhat Isolated  . Frequency of Communication with Friends and Family: More than three times a week  . Frequency of Social Gatherings with Friends and Family: More than three times a week  . Attends Religious Services: More than 4 times per year  . Active Member of Clubs or Organizations: No  . Attends Banker Meetings: Never  . Marital Status:  Never married   Outpatient Encounter Medications as of 01/24/2019  Medication Sig  . docusate sodium (COLACE) 100 MG capsule Take 200 mg by mouth 2 (two) times daily.  . fexofenadine (ALLEGRA) 180 MG tablet Take 180 mg by mouth daily.  Marland Kitchen glucose blood (ACCU-CHEK AVIVA) test strip Use as instructed  . Lancets (ACCU-CHEK SOFT TOUCH) lancets Once daily testing.  . nystatin cream (MYCOSTATIN) APPLY EXTERNALLY TO THE AFFECTED AREA TWICE DAILY  . omeprazole (PRILOSEC) 20 MG capsule Take 20 mg by mouth daily.  Marland Kitchen atorvastatin (LIPITOR) 20 MG tablet TAKE 1/2 TABLET(10 MG) BY MOUTH DAILY AT 6 PM (Patient not taking: Reported on 01/24/2019)  . carvedilol (COREG) 25 MG tablet TAKE 1 TABLET BY MOUTH EVERY MORNING AND 2 TABLETS EVERY NIGHT AT BEDTIME WITH FOOD (Patient not taking: Reported on 01/24/2019)  . metFORMIN (GLUCOPHAGE) 1000 MG tablet TAKE 1 TABLET BY MOUTH TWICE DAILY  . olmesartan (BENICAR) 5 MG tablet Take 1 tablet (5 mg total) by mouth daily. (Patient not taking: Reported on 01/24/2019)  . PARoxetine (PAXIL) 10 MG tablet TAKE 1 TABLET BY MOUTH EVERY DAY (Patient not taking: Reported on 01/24/2019)  . [DISCONTINUED] metFORMIN (GLUCOPHAGE) 1000 MG tablet Take 1,000 mg by mouth 2 (two) times daily.  . [DISCONTINUED] OZEMPIC, 0.25 OR 0.5 MG/DOSE, 2 MG/1.5ML SOPN Inject 0.5 mg into the skin once a week.   No facility-administered encounter medications on file as of 01/24/2019.   Activities of Daily Living In your present state of health, do you have any difficulty performing the following activities: 01/24/2019  Hearing? N  Vision? N  Difficulty concentrating or making decisions? N  Walking or climbing stairs? N  Dressing or bathing? N  Doing errands, shopping? Y  Comment doesnt drive- kids are very helpful  Preparing Food and eating ? N  Using the Toilet? N  In the past six months, have you accidently leaked urine? N  Do you have problems with loss of bowel control? N  Managing your  Medications? N  Managing your Finances? N  Housekeeping or managing your Housekeeping? N  Some recent data might be hidden   Patient Care Team: Lennox Solders, MD as PCP - General (Family Medicine) Arnette Felts, FNP as Referring Physician (General Practice)    Assessment:   This is a routine wellness examination for K[place].  Exercise Activities and Dietary recommendations Current Exercise Habits: Home exercise routine, Type of exercise: walking, Time (Minutes): 60, Frequency (Times/Week): 7, Weekly Exercise (Minutes/Week): 420, Intensity: Moderate  Goals    . Patient Stated     Maintain lifestyle      Fall Risk Fall Risk  01/24/2019 09/06/2017 06/09/2017 06/08/2017  Falls in the past year? 0 No No No   Is the patient's home free of loose throw rugs in walkways, pet beds, electrical cords, etc?   yes      Grab bars in the bathroom? yes      Handrails on the stairs?   yes      Adequate lighting?   yes  Patient rating of health (0-10) scale: 9  Depression Screen PHQ 2/9 Scores 01/24/2019 12/30/2017 09/06/2017 06/09/2017  PHQ - 2 Score 0 0 0 0  PHQ- 9 Score - - - 0    Cognitive Function  6CIT Screen 01/24/2019  What Year? 0 points  What month? 0 points  What time? 0 points  Count back from 20 0 points  Months in reverse 0 points  Repeat phrase 0 points  Total Score 0   Immunization History  Administered Date(s) Administered  . Influenza,inj,Quad PF,6+ Mos 08/26/2016, 09/06/2017  . Pneumococcal Conjugate-13 05/13/2017  . Tdap 05/13/2017  . Zoster Recombinat (Shingrix) 05/16/2017   Screening Tests Health Maintenance  Topic Date Due  . FOOT EXAM  11/30/1961  . OPHTHALMOLOGY EXAM  11/30/1961  . COLONOSCOPY  11/30/2001  . PNA vac Low Risk Adult (2 of 2 - PPSV23) 05/14/2018  . HEMOGLOBIN A1C  07/01/2018  . INFLUENZA VACCINE  08/12/2018  . MAMMOGRAM  09/10/2019  . TETANUS/TDAP  05/14/2027  . DEXA SCAN  Completed  . Hepatitis C Screening  Completed   Cancer  Screenings: Lung: Low Dose CT Chest recommended if Age 99-80 years, 30 pack-year currently smoking OR have quit w/in 15years. Patient does not qualify. Breast:  Up to date on Mammogram? Yes   Up to date of Bone Density/Dexa? Yes Colorectal: Due- counseling given  Additional Screenings: Hepatitis C Screening: Completed   Plan:  Keep up the good work walking and keeping a positive attitude during the Pandemic. When you are interested in an advance directive let us know. You can get your flu vaccine at your local pharmacy.  You are due for your mammogram this year.  Consider getting a FU colonoscopy. Keep virtual apt with PCP on 1/20 @3 :00pm  I have personally reviewed and noted the following in the patient's chart:   . Medical and social history . Use of alcohol, tobacco or illicit drugs  . Current medications and supplements . Functional ability and status . Nutritional status . Physical activity . Advanced directives . List of other physicians . Hospitalizations, surgeries, and ER visits in previous 12 months . Vitals . Screenings to include cognitive, depression, and falls . Referrals and appointments  In addition, I have reviewed and discussed with patient certain preventive protocols, quality metrics, and best practice recommendations. A written personalized care plan for preventive services as well as general preventive health recommendations were provided to patient.  This visit was conducted virtually in the setting of the COVID19 pandemic.    , CMA  01/24/2019    I have reviewed this visit and agree with the documentation.   Amanda C. 01/26/2019, MD PGY-3, [facility] 01/25/2019 9:17 PM

## 2019-01-31 ENCOUNTER — Telehealth (INDEPENDENT_AMBULATORY_CARE_PROVIDER_SITE_OTHER): Payer: Medicare Other | Admitting: Family Medicine

## 2019-01-31 ENCOUNTER — Other Ambulatory Visit: Payer: Self-pay

## 2019-01-31 DIAGNOSIS — I10 Essential (primary) hypertension: Secondary | ICD-10-CM

## 2019-01-31 DIAGNOSIS — E1165 Type 2 diabetes mellitus with hyperglycemia: Secondary | ICD-10-CM | POA: Diagnosis not present

## 2019-01-31 MED ORDER — ONDANSETRON HCL 4 MG PO TABS: 4.0000 mg | ORAL_TABLET | Freq: Three times a day (TID) | ORAL | 0 refills | Status: DC | PRN

## 2019-01-31 NOTE — Progress Notes (Signed)
 [facility] Telemedicine Visit  Patient consented to have virtual visit. Method of visit: Telephone  Encounter participants: Patient: [name] [name] - located at home Provider: Lennox Solders - located at [facility] Others (if applicable): None  Chief Complaint: diabetes and hypertension f/u  HPI:  Diabetes Sugars have been in the 70s in the mornings, will sometimes feel nauseated when this happens but denies any other hypoglycemic symptoms, would like a medicine to help with nausea Has been trying to eat small amounts throughout the day and focuses on eating vegetables and protein Continues to take Ozempic 0.25 mg once weekly Has stopped metformin  Hypertension Is not taking any medications for BP currently because her BP is on the low side BP is often around 80/60 according to her home monitor but denies dizziness or orthostatic symptoms  ROS: per HPI  Pertinent PMHx: Hypertension, type 2 diabetes, hyperlipidemia  Exam:  Respiratory: No cough or shortness of breath heard during conversation  Assessment/Plan:  HTN (hypertension) I am concerned that patient's home monitor is not accurately measuring her blood pressure and that she may actually be hypertensive and not taking any medication for this currently.  Encouraged her to have her blood pressure checked at [facility] or drugstore and call me with this number.  We also should see her soon if she is willing in order to check it here and discuss medications.  However, she is concerned about Covid, so this may need to wait until this is less of a concern.  Uncontrolled type 2 diabetes mellitus with hyperglycemia (HCC) I am also worried that patient may be under treating her diabetes by stopping her Metformin, but it is reassuring that she continues to take Ozempic.  Since her blood sugars that she reports do sound normal for fasting levels, we will not make any changes today.  I encouraged her to come in as  soon as she feels comfortable so that we can check her A1c and adjust medications.  Prescribed Zofran as needed for occasional nausea.    Time spent during visit with patient: 12 minutes

## 2019-02-01 ENCOUNTER — Other Ambulatory Visit: Payer: Self-pay | Admitting: Family Medicine

## 2019-02-01 NOTE — Assessment & Plan Note (Signed)
I am concerned that patient's home monitor is not accurately measuring her blood pressure and that she may actually be hypertensive and not taking any medication for this currently.  Encouraged her to have her blood pressure checked at [facility] or drugstore and call me with this number.  We also should see her soon if she is willing in order to check it here and discuss medications.  However, she is concerned about Covid, so this may need to wait until this is less of a concern.

## 2019-02-01 NOTE — Assessment & Plan Note (Addendum)
I am also worried that patient may be under treating her diabetes by stopping her Metformin, but it is reassuring that she continues to take Ozempic.  Since her blood sugars that she reports do sound normal for fasting levels, we will not make any changes today.  I encouraged her to come in as soon as she feels comfortable so that we can check her A1c and adjust medications.  Prescribed Zofran as needed for occasional nausea.

## 2019-07-25 NOTE — Progress Notes (Signed)
° ° °  SUBJECTIVE:   CHIEF COMPLAINT / HPI: weak legs and stiff joints  Patient reports she was at the beach a little while back and noticed joint stiffness and some weakness. She also reports having mild dizziness.  She stated that she knew her salt was low because when she licked her fingers or had some fat back symptoms resolved.  Denies any chest pain, palpitations, shortness of breath or lower extremity edema.  She denies any weakness, numbness or tingling or decrease in sensation.  Patient endorses polyuria and polydipsia.  Noncompliant with medications and reports she cannot take any pills.  Currently on Ozempic 0.25 units.  Patient states that she feels fine.  PERTINENT  PMH / PSH:  Diabetes type 2 uncontrolled Hypertension uncontrolled Esophageal strictures  OBJECTIVE:   BP (!) 145/75    Pulse 76    Wt 176 lb 9.6 oz (80.1 kg)    SpO2 99%    BMI 31.28 kg/m    General: Alert and oriented, no apparent distress  Cardiovascular: Irregular rhythm and tachycardic, no gallops or murmurs appreciated Respiratory: CTA bilaterally  Gastrointestinal: Bowel sounds present. No abdominal pain   ASSESSMENT/PLAN:   Atrial tachycardia (HCC) Patient in no acute distress.  Denies any previous history of heart palpitations.  Does report now that feeling heartbeat in her back. -EKG shows A. fib RVR with ventricular rate 164 bpm. -Given the patient unable to take p.o. medications well advised to go to emergency room for rate control and possible anticoagulation therapy. -Follow-up 1 week.   Uncontrolled type 2 diabetes mellitus with hyperglycemia (HCC) Hemoglobin A1c greater than 15.  CBG 377.  Patient reports she has not eaten today.  Patient has stopped taking Metformin and all the medications except for Ozempic. -Discussed risks of uncontrolled diabetes. -Given that patient will be going to the emergency department for atrial tachycardia we will revisit diabetes management at next  visit. -Follow-up in 1 week   Dana Allan, MD [facility]

## 2019-07-25 NOTE — Patient Instructions (Addendum)
Thank you for coming to see me today. It was a pleasure. Today we talked about:   You should pay attention to your hemoglobin A1C.  It is a three month test about your average blood sugar. If the A1C is - <7.0 is great.  That is our goal for treating you. - Between 7.0 and 9.0 is not so good.  We would need to work to do better. - Above 9.0 is terrible.  You would really need to work with [place] to get it under control.    Today's A1C greater than 15 Blood sugar 377  Blood work today, I will cal you if results are abnormal I recommend medication for Blood pressure and cholesterol to prevent cardiovascular events Please follow-up with PCP in 1-2 weeks  If you have any questions or concerns, please do not hesitate to call the office at [phone].  Best,   Dana Allan, MD [facility]    Diabetes Mellitus and Standards of Medical Care Managing diabetes (diabetes mellitus) can be complicated. Your diabetes treatment may be managed by a team of health care providers, including:  A physician who specializes in diabetes (endocrinologist).  A nurse practitioner or physician assistant.  Nurses.  A diet and nutrition specialist (registered dietitian).  A certified diabetes educator (CDE).  An exercise specialist.  A pharmacist.  An eye doctor.  A foot specialist (podiatrist).  A dentist.  A primary care provider.  A mental health provider. Your health care providers follow guidelines to help you get the best quality of care. The following schedule is a general guideline for your diabetes management plan. Your health care providers may give you more specific instructions. Physical exams Upon being diagnosed with diabetes mellitus, and each year after that, your health care provider will ask about your medical and family history. He or she will also do a physical exam. Your exam may include:  Measuring your height, weight, and body mass index (BMI).  Checking  your blood pressure. This will be done at every routine medical visit. Your target blood pressure may vary depending on your medical conditions, your age, and other factors.  Thyroid gland exam.  Skin exam.  Screening for damage to your nerves (peripheral neuropathy). This may include checking the pulse in your legs and feet and checking the level of sensation in your hands and feet.  A complete foot exam to inspect the structure and skin of your feet, including checking for cuts, bruises, redness, blisters, sores, or other problems.  Screening for blood vessel (vascular) problems, which may include checking the pulse in your legs and feet and checking your temperature. Blood tests Depending on your treatment plan and your personal needs, you may have the following tests done:  HbA1c (hemoglobin A1c). This test provides information about blood sugar (glucose) control over the previous 2-3 months. It is used to adjust your treatment plan, if needed. This test will be done: ? At least 2 times a year, if you are meeting your treatment goals. ? 4 times a year, if you are not meeting your treatment goals or if treatment goals have changed.  Lipid testing, including total, LDL, and HDL cholesterol and triglyceride levels. ? The goal for LDL is less than 100 mg/dL (5.5 mmol/L). If you are at high risk for complications, the goal is less than 70 mg/dL (3.9 mmol/L). ? The goal for HDL is 40 mg/dL (2.2 mmol/L) or higher for men and 50 mg/dL (2.8 mmol/L) or higher  for women. An HDL cholesterol of 60 mg/dL (3.3 mmol/L) or higher gives some protection against heart disease. ? The goal for triglycerides is less than 150 mg/dL (8.3 mmol/L).  Liver function tests.  Kidney function tests.  Thyroid function tests. Dental and eye exams  Visit your dentist two times a year.  If you have type 1 diabetes, your health care provider may recommend an eye exam 3-5 years after you are diagnosed, and then once a  year after your first exam. ? For children with type 1 diabetes, a health care provider may recommend an eye exam when your child is age 31 or older and has had diabetes for 3-5 years. After the first exam, your child should get an eye exam once a year.  If you have type 2 diabetes, your health care provider may recommend an eye exam as soon as you are diagnosed, and then once a year after your first exam. Immunizations   The yearly flu (influenza) vaccine is recommended for everyone 6 months or older who has diabetes.  The pneumonia (pneumococcal) vaccine is recommended for everyone 2 years or older who has diabetes. If you are 10 or older, you may get the pneumonia vaccine as a series of two separate shots.  The hepatitis B vaccine is recommended for adults shortly after being diagnosed with diabetes.  Adults and children with diabetes should receive all other vaccines according to age-specific recommendations from the Centers for Disease Control and Prevention (CDC). Mental and emotional health Screening for symptoms of eating disorders, anxiety, and depression is recommended at the time of diagnosis and afterward as needed. If your screening shows that you have symptoms (positive screening result), you may need more evaluation and you may work with a mental health care provider. Treatment plan Your treatment plan will be reviewed at every medical visit. You and your health care provider will discuss:  How you are taking your medicines, including insulin.  Any side effects you are experiencing.  Your blood glucose target goals.  The frequency of your blood glucose monitoring.  Lifestyle habits, such as activity level as well as tobacco, alcohol, and substance use. Diabetes self-management education Your health care provider will assess how well you are monitoring your blood glucose levels and whether you are taking your insulin correctly. He or she may refer you to:  A certified  diabetes educator to manage your diabetes throughout your life, starting at diagnosis.  A registered dietitian who can create or review your personal nutrition plan.  An exercise specialist who can discuss your activity level and exercise plan. Summary  Managing diabetes (diabetes mellitus) can be complicated. Your diabetes treatment may be managed by a team of health care providers.  Your health care providers follow guidelines in order to help you get the best quality of care.  Standards of care including having regular physical exams, blood tests, blood pressure monitoring, immunizations, screening tests, and education about how to manage your diabetes.  Your health care providers may also give you more specific instructions based on your individual health. This information is not intended to replace advice given to you by your health care provider. Make sure you discuss any questions you have with your health care provider. Document Revised: 09/16/2017 Document Reviewed: 09/26/2015 Elsevier Patient Education  2020 [employer].

## 2019-07-26 ENCOUNTER — Ambulatory Visit (HOSPITAL_COMMUNITY): Admit: 2019-07-26 | Discharge: 2019-07-26 | Disposition: A | Discharge status: Home / Self Care | Payer: Medicare Other

## 2019-07-26 ENCOUNTER — Ambulatory Visit (INDEPENDENT_AMBULATORY_CARE_PROVIDER_SITE_OTHER): Payer: Medicare Other | Admitting: Family Medicine

## 2019-07-26 ENCOUNTER — Inpatient Hospital Stay (HOSPITAL_COMMUNITY)
Admission: EM | Admit: 2019-07-26 | Discharge: 2019-08-01 | DRG: 308 | Disposition: A | Discharge status: Home / Self Care | Payer: Medicare Other | Attending: Family Medicine | Admitting: Family Medicine

## 2019-07-26 ENCOUNTER — Other Ambulatory Visit: Payer: Self-pay

## 2019-07-26 ENCOUNTER — Encounter (HOSPITAL_COMMUNITY): Payer: Self-pay | Admitting: Emergency Medicine

## 2019-07-26 ENCOUNTER — Emergency Department (HOSPITAL_COMMUNITY): Payer: Medicare Other

## 2019-07-26 ENCOUNTER — Encounter: Payer: Self-pay | Admitting: Family Medicine

## 2019-07-26 VITALS — BP 145/75 | HR 76 | Wt 176.6 lb

## 2019-07-26 DIAGNOSIS — I2510 Atherosclerotic heart disease of native coronary artery without angina pectoris: Secondary | ICD-10-CM | POA: Diagnosis present

## 2019-07-26 DIAGNOSIS — Z91018 Allergy to other foods: Secondary | ICD-10-CM | POA: Diagnosis not present

## 2019-07-26 DIAGNOSIS — Z20822 Contact with and (suspected) exposure to covid-19: Secondary | ICD-10-CM | POA: Diagnosis not present

## 2019-07-26 DIAGNOSIS — I480 Paroxysmal atrial fibrillation: Principal | ICD-10-CM | POA: Diagnosis present

## 2019-07-26 DIAGNOSIS — R9431 Abnormal electrocardiogram [ECG] [EKG]: Secondary | ICD-10-CM | POA: Diagnosis not present

## 2019-07-26 DIAGNOSIS — I4891 Unspecified atrial fibrillation: Secondary | ICD-10-CM | POA: Diagnosis not present

## 2019-07-26 DIAGNOSIS — Z79899 Other long term (current) drug therapy: Secondary | ICD-10-CM

## 2019-07-26 DIAGNOSIS — E782 Mixed hyperlipidemia: Secondary | ICD-10-CM | POA: Diagnosis not present

## 2019-07-26 DIAGNOSIS — Z886 Allergy status to analgesic agent status: Secondary | ICD-10-CM

## 2019-07-26 DIAGNOSIS — F319 Bipolar disorder, unspecified: Secondary | ICD-10-CM | POA: Diagnosis present

## 2019-07-26 DIAGNOSIS — E119 Type 2 diabetes mellitus without complications: Secondary | ICD-10-CM

## 2019-07-26 DIAGNOSIS — G4733 Obstructive sleep apnea (adult) (pediatric): Secondary | ICD-10-CM | POA: Diagnosis present

## 2019-07-26 DIAGNOSIS — Z6831 Body mass index (BMI) 31.0-31.9, adult: Secondary | ICD-10-CM

## 2019-07-26 DIAGNOSIS — I484 Atypical atrial flutter: Secondary | ICD-10-CM | POA: Diagnosis present

## 2019-07-26 DIAGNOSIS — J90 Pleural effusion, not elsewhere classified: Secondary | ICD-10-CM | POA: Diagnosis not present

## 2019-07-26 DIAGNOSIS — I495 Sick sinus syndrome: Secondary | ICD-10-CM | POA: Diagnosis not present

## 2019-07-26 DIAGNOSIS — I5041 Acute combined systolic (congestive) and diastolic (congestive) heart failure: Secondary | ICD-10-CM | POA: Diagnosis present

## 2019-07-26 DIAGNOSIS — I361 Nonrheumatic tricuspid (valve) insufficiency: Secondary | ICD-10-CM | POA: Diagnosis not present

## 2019-07-26 DIAGNOSIS — Z8719 Personal history of other diseases of the digestive system: Secondary | ICD-10-CM

## 2019-07-26 DIAGNOSIS — I471 Supraventricular tachycardia: Secondary | ICD-10-CM | POA: Diagnosis not present

## 2019-07-26 DIAGNOSIS — Z91010 Allergy to peanuts: Secondary | ICD-10-CM | POA: Diagnosis not present

## 2019-07-26 DIAGNOSIS — I110 Hypertensive heart disease with heart failure: Secondary | ICD-10-CM | POA: Diagnosis not present

## 2019-07-26 DIAGNOSIS — E1165 Type 2 diabetes mellitus with hyperglycemia: Secondary | ICD-10-CM | POA: Diagnosis not present

## 2019-07-26 DIAGNOSIS — M8580 Other specified disorders of bone density and structure, unspecified site: Secondary | ICD-10-CM | POA: Diagnosis present

## 2019-07-26 DIAGNOSIS — I517 Cardiomegaly: Secondary | ICD-10-CM | POA: Diagnosis not present

## 2019-07-26 DIAGNOSIS — I10 Essential (primary) hypertension: Secondary | ICD-10-CM | POA: Diagnosis not present

## 2019-07-26 DIAGNOSIS — R002 Palpitations: Secondary | ICD-10-CM | POA: Diagnosis not present

## 2019-07-26 LAB — SARS CORONAVIRUS 2 BY RT PCR (HOSPITAL ORDER, PERFORMED IN ~~LOC~~ HOSPITAL LAB): SARS Coronavirus 2: NEGATIVE

## 2019-07-26 LAB — CBC
HCT: 42.3 % (ref 36.0–46.0)
Hemoglobin: 12.9 g/dL (ref 12.0–15.0)
MCH: 25.2 pg — ABNORMAL LOW (ref 26.0–34.0)
MCHC: 30.5 g/dL (ref 30.0–36.0)
MCV: 82.6 fL (ref 80.0–100.0)
Platelets: 220 10*3/uL (ref 150–400)
RBC: 5.12 MIL/uL — ABNORMAL HIGH (ref 3.87–5.11)
RDW: 14 % (ref 11.5–15.5)
WBC: 7.7 10*3/uL (ref 4.0–10.5)
nRBC: 0 % (ref 0.0–0.2)

## 2019-07-26 LAB — BASIC METABOLIC PANEL
Anion gap: 12 (ref 5–15)
BUN: 9 mg/dL (ref 8–23)
CO2: 26 mmol/L (ref 22–32)
Calcium: 8.8 mg/dL — ABNORMAL LOW (ref 8.9–10.3)
Chloride: 100 mmol/L (ref 98–111)
Creatinine, Ser: 0.93 mg/dL (ref 0.44–1.00)
GFR calc Af Amer: >60 mL/min (ref >60)
GFR calc non Af Amer: >60 mL/min (ref >60)
Glucose, Bld: 381 mg/dL — ABNORMAL HIGH (ref 70–99)
Potassium: 4.5 mmol/L (ref 3.5–5.1)
Sodium: 138 mmol/L (ref 135–145)

## 2019-07-26 LAB — POCT GLYCOSYLATED HEMOGLOBIN (HGB A1C): HbA1c POC (<> result, manual entry): >15 % (ref 4.0–5.6)

## 2019-07-26 LAB — TROPONIN I (HIGH SENSITIVITY)
Troponin I (High Sensitivity): 20 ng/L — ABNORMAL HIGH (ref <18)
Troponin I (High Sensitivity): 18 ng/L — ABNORMAL HIGH (ref <18)

## 2019-07-26 LAB — CBG MONITORING, ED
Glucose-Capillary: 331 mg/dL — ABNORMAL HIGH (ref 70–99)
Glucose-Capillary: 263 mg/dL — ABNORMAL HIGH (ref 70–99)

## 2019-07-26 LAB — GLUCOSE, POCT (MANUAL RESULT ENTRY): POC Glucose: 377 mg/dl — AB (ref 70–99)

## 2019-07-26 LAB — HIV ANTIBODY (ROUTINE TESTING W REFLEX): HIV Screen 4th Generation wRfx: NONREACTIVE

## 2019-07-26 LAB — TSH: TSH: 2.32 u[IU]/mL (ref 0.350–4.500)

## 2019-07-26 LAB — HEPARIN LEVEL (UNFRACTIONATED): Heparin Unfractionated: 0.48 IU/mL (ref 0.30–0.70)

## 2019-07-26 MED ORDER — HEPARIN BOLUS VIA INFUSION
4000.0000 [IU] | Freq: Once | INTRAVENOUS | Status: AC
  Administered 2019-07-26: 4000 [IU] via INTRAVENOUS
  Filled 2019-07-26: qty 4000

## 2019-07-26 MED ORDER — SODIUM CHLORIDE 0.9% FLUSH: 3.0000 mL | Freq: Once | INTRAVENOUS | Status: DC

## 2019-07-26 MED ORDER — HEPARIN (PORCINE) 25000 UT/250ML-% IV SOLN
1050.0000 [IU]/h | INTRAVENOUS | Status: DC
  Administered 2019-07-26 – 2019-07-27 (×2): 1050 [IU]/h via INTRAVENOUS
  Filled 2019-07-26 (×3): qty 250

## 2019-07-26 MED ORDER — ONDANSETRON HCL 4 MG/2ML IJ SOLN: 4.0000 mg | Freq: Four times a day (QID) | INTRAMUSCULAR | Status: DC | PRN

## 2019-07-26 MED ORDER — INSULIN ASPART 100 UNIT/ML ~~LOC~~ SOLN
0.0000 [IU] | Freq: Every day | SUBCUTANEOUS | Status: DC
  Administered 2019-07-28 – 2019-07-30 (×3): 3 [IU] via SUBCUTANEOUS

## 2019-07-26 MED ORDER — METOPROLOL TARTRATE 25 MG PO TABS
25.0000 mg | ORAL_TABLET | Freq: Two times a day (BID) | ORAL | Status: DC
  Administered 2019-07-26 – 2019-07-27 (×2): 25 mg via ORAL
  Filled 2019-07-26 (×2): qty 1

## 2019-07-26 MED ORDER — POLYETHYLENE GLYCOL 3350 17 G PO PACK
17.0000 g | PACK | Freq: Every day | ORAL | Status: DC
  Administered 2019-07-27: 17 g via ORAL
  Filled 2019-07-26 (×3): qty 1

## 2019-07-26 MED ORDER — SENNA 8.6 MG PO TABS
1.0000 | ORAL_TABLET | Freq: Every day | ORAL | Status: DC
  Administered 2019-07-27 – 2019-08-01 (×5): 8.6 mg via ORAL
  Filled 2019-07-26 (×5): qty 1

## 2019-07-26 MED ORDER — DILTIAZEM LOAD VIA INFUSION
20.0000 mg | Freq: Once | INTRAVENOUS | Status: AC
  Administered 2019-07-26: 20 mg via INTRAVENOUS
  Filled 2019-07-26: qty 20

## 2019-07-26 MED ORDER — INSULIN ASPART 100 UNIT/ML ~~LOC~~ SOLN
0.0000 [IU] | Freq: Three times a day (TID) | SUBCUTANEOUS | Status: DC
  Administered 2019-07-26: 5 [IU] via SUBCUTANEOUS
  Administered 2019-07-27: 9 [IU] via SUBCUTANEOUS

## 2019-07-26 MED ORDER — INSULIN GLARGINE 100 UNIT/ML ~~LOC~~ SOLN
10.0000 [IU] | Freq: Every day | SUBCUTANEOUS | Status: DC
  Administered 2019-07-26: 10 [IU] via SUBCUTANEOUS
  Filled 2019-07-26 (×2): qty 0.1

## 2019-07-26 MED ORDER — DILTIAZEM HCL-DEXTROSE 125-5 MG/125ML-% IV SOLN (PREMIX)
5.0000 mg/h | INTRAVENOUS | Status: DC
  Administered 2019-07-26: 5 mg/h via INTRAVENOUS
  Administered 2019-07-27 – 2019-07-28 (×3): 15 mg/h via INTRAVENOUS
  Filled 2019-07-26 (×8): qty 125

## 2019-07-26 NOTE — Progress Notes (Signed)
FPTS Interim Progress Note  S: Went to see the patient as she was tachy in the 130s. Patient reports that she feels well. She is not tired, but normally goes to sleep around 10-12. Patient was receiving her metoprolol at the time of patient interview. She is aware that she is tachycardic. She reported that she used to see a "heart doctor for fluid around the heart" for approx 2 years, but stopped seeing anyone after the physician moved. She is in no pain at this time. Patient also reports that she normally takes "stool softeners" to avoid constipation.  O: BP (!) 138/93   Pulse (!) 132   Temp 98 F (36.7 C) (Oral)   Resp 20   Ht 5' 2.99" (1.6 m)   Wt 80.1 kg   SpO2 95%   BMI 31.29 kg/m   General: Patient resting comfortably in bed. Jovial mood CV: Tachycardic regular rhythm, no murmur detected Resp: Clear and equal to bilateral auscl, no extra work of breathing noted GI: Tender to palpation all 4 quadrants, but no pain to palpation, Normoactive bowel sounds  A/P: We will continue to monitor patient's vital signs. Patient received metoprolol during this encounter, hopefully the HR will decrease in response to this. - Continue to monitor - Start bowel regimen  Bobbye Morton, MD 07/26/2019, 10:16 PM PGY-1, [facility] pager [phone]

## 2019-07-26 NOTE — Plan of Care (Signed)
  Problem: Education: Goal: Understanding of medication regimen will improve Outcome: Progressing   Problem: Activity: Goal: Ability to tolerate increased activity will improve Outcome: Progressing   Problem: Cardiac: Goal: Ability to achieve and maintain adequate cardiopulmonary perfusion will improve Outcome: Progressing   

## 2019-07-26 NOTE — Progress Notes (Signed)
Noted that patient's heart rate remains in the 130s despite maximum of diltiazem drip.  Otherwise hemodynamically stable with systolics in the 180s.  Called cardiology, Dr. Mayford Knife, for any recommendations regarding further rate control at this time.  She recommend starting Lopressor 25mg  BID and titrating up as needed.  No formal cardiology consult needed currently, however happy to consult if she is not able to get rate controlled with the above measurements or concurrent abnormal echocardiogram.  Started Lopressor.  Will monitor and plan to have her follow-up with cardiology outpatient.  , DO

## 2019-07-26 NOTE — Progress Notes (Signed)
ANTICOAGULATION CONSULT NOTE - Initial Consult  Pharmacy Consult for Heparin Indication: atrial fibrillation  Allergies  Allergen Reactions   Fruit & Vegetable Daily [Nutritional Supplements] Swelling and Other (See Comments)    All fruits cause tongue swelling and numbness   Other Swelling    Tree nuts cause tongue swelling and numbness   Peanut-Containing Drug Products Swelling and Other (See Comments)    Tongue swelling and numbness   Aspirin Nausea And Vomiting and Other (See Comments)    Cannot take due to liver issues   Tylenol [Acetaminophen] Other (See Comments)    Cannot take due to liver issues    Patient Measurements: Height: 5' 2.99" (160 cm) Weight: 80.1 kg (176 lb 9.4 oz) IBW/kg (Calculated) : 52.38 Heparin Dosing Weight: 69.9kg  Vital Signs: Temp: 98.5 F (36.9 C) (07/15 1302) Temp Source: Oral (07/15 1302) BP: 181/132 (07/15 1400) Pulse Rate: 41 (07/15 1400)  Labs: Recent Labs    07/26/19 1318  HGB 12.9  HCT 42.3  PLT 220    CrCl cannot be calculated (Patient's most recent lab result is older than the maximum 21 days allowed.).   Medical History: Past Medical History:  Diagnosis Date   Abdominal pain    Back pain    Constipation    Fatigue    Full dentures    Hemorrhoids    Hypertension    Lack of adequate sleep    Liver mass    Rash    on right breast   Wears glasses    Assessment: Patient is a 69yo female who presents with atrial fibrillation. H/H & plt wnl. Patient is not on anticoagulation at home.  Goal of Therapy:  Heparin level 0.3-0.7 units/ml Monitor platelets by anticoagulation protocol: Yes   Plan:  Give 4000 units bolus x 1 Start heparin infusion at 1050 units/hr  Check 6h heparin level. Monitor daily HL, CBC, & s/sx of bleeding.  Arletha Pili 07/26/2019,2:17 PM

## 2019-07-26 NOTE — H&P (Addendum)
Family Medicine Teaching [facility] Admission History and Physical Service Pager: [phone]  Patient name: [name] [name] Medical record number: [id number] Date of birth: 04/28/1950 Age: 69 y.o. Gender: female  Primary Care Provider: Dana Allan, MD Consultants: Pharmacy, cardiology Code Status: Full Preferred Emergency Contact: Destiny, granddaughter, [phone] (But also both daughters listed in chart)  Chief Complaint: "Heart racing"   Assessment and Plan: [name] [name] is a 69 y.o. female presenting with palpitations and weakness. PMH is significant for hypertension, diabetes, history of pericardial effusion and mixed hyperlipidemia.   New onset atrial fibrillation with RVR: Acute, improving. Reports palpitations and generalized weakness intermittently for the past one week, without similar symptoms in the past.  Initial HR 160-170s but otherwise hemodynamically stable and well-appearing, started on diltiazem and heparin gtt in the ED.  HR consistently 130s on diltiazem 5 ml/hr during evaluation.  Unclear trigger for new onset atrial fibrillation, but does have significant risk factors including poorly controlled DM (A1c>15), hypertension, BMI 31, and advanced age.  However, will rule out alternative etiology including MI or CHF via echocardiogram and troponin especially given history of mild LE swelling and chest pressure last week.  Troponin reassuringly flat at 20>18. TSH WNL.  No s/sx of infection through history or clinical exam, CXR clear without evidence of PNA.  While has poorly controlled diabetes, no associated acidosis or anion gap to suggest contributory DKA/HHS. -Admit to cardiac telemetry, attending Dr. Pollie Meyer -IV Diltiazem drip, will likely transition tomorrow morning -Heparin IV, likely transition to Eliquis tomorrow -Add Lopressor 25 mg bid per cardiology recs (see separate progress note) -PT/OT -Check magnesium level -Vitals per routine -Consult  cardiology if unable to rate control with above regimen, including titrating up Lopressor -Follow up with cardiology outpatient -Will also need sleep study o/p to rule out OSA due to history of nighttime snoring with elevated BMI  Hypertension: Chronic, poorly controlled. BP admission was 159/115>160/86>181/132.  Prescribed Benicar 5 mg, however she does not take this at home.          -Lopressor and diltiazem drip as above -Monitor BP  Type 2 Diabetes: Chronic, poorly controlled. Blood glucose is 381 and hemoglobin A1c on admission is >15 compared to a year ago when it was 9.4.  No AG or acidosis to suggest DKA at this time.  Home meds include ozempic weekly, questionable compliance per granddaughter at bedside. -Start Lantus 10 units -sSSI -monitor CBG -Consult diabetes coordinator for additional assistance and education -Will need to go home on insulin if possible                                                                                        Hyperlipidemia: Chronic, stable. Patient has history of mixed hyperlipidemia. Lipid panel from 06/08/2017 demonstrated low HDL of 35 while cholesterol, triglycerides and LDL were all within normal limits.  Prescribed atorvastatin, she has not been taking this. -Repeat lipid panel -Consider restarting atorvastatin for primary prevention, especially in setting of DM  H/o Small Pericardial effusion Patient has a previous history of pericardial effusion in 2013.  Cardiology recommended follow-up in 1 year, however lost to follow-up.  Patient  denies any chest pain or dyspnea. No rubs or gallops were noted on physical exam.  -Echo as above  FEN/GI: Modified carb diet Prophylaxis: Heparin gtt  Disposition: Admit to cardiac telemetry, attending Dr. Pollie Meyer   History of Present Illness:  [name] [name] is a 69 y.o. female presenting with palpitations and weakness that started 1 week ago. Initially, she noticed these symptoms when she was  sitting at the picnic table during a cook out last week (July 4) when she suddenly felt a feeling of a "heavy weight" on the left side of her chest and her heart beating very fast. She did not take any medications to alleviate the symptoms but 8 some salt which seemed to help. She also admits that her 93 year old granddaughter pressing on her chest relieved the pressure as well. Patient states that she recovered from the initial episode that same day and these episodes kept occurring intermittently. She denies any associated blurred vision or vision changes, dizziness, dyspnea, lightheadedness and generalized pain. She denies a prior heart attack. But since these episodes started about a week ago, she admits to a decrease in her activity level. Patient previously would go walking everyday and has not been since this happened. She reports that the only time she has gone out in the past week is to go take out the trash which is when she feels weak from standing but sitting makes it better. She denies ever having experienced anything like this before and also denies any correlation of her symptoms to the warm weather. She states that it is more comfortable at night to sleep with pillow elevating her head. Patient saw Dr. Clent Ridges, her PCP, earlier today when she reports feeling fine. She has also received her first Moderna shot on 07/10/2019, wonders if this contributed. Patient denies any problems with gait, she ambulates at baseline without any assistance but has been limited lately due to her new onset of symptoms.  Denies any recent fever, nausea/vomiting, abdominal pain, unilateral leg swelling, rash/wounds, cough, dysuria.  Reports her family tells her that she snores loudly at night, no morning headaches or gasping for air.  ED course: On arrival, her heart rate was in the 160-170s but otherwise hemodynamically stable.  She was started on a diltiazem and heparin drip.  Chest x-ray clear.  CBC and BMP  noncontributory.  Initial troponin 20.  TSH normal.  Review Of Systems: Per HPI with the following additions:   Review of Systems  Constitutional: Positive for activity change. Negative for fever.  Eyes: Negative for visual disturbance.  Respiratory: Negative for shortness of breath.   Cardiovascular: Positive for palpitations and leg swelling. Negative for chest pain.  Gastrointestinal: Negative for abdominal pain.  Musculoskeletal: Negative for gait problem.  Neurological: Positive for weakness. Negative for dizziness, light-headedness and numbness.  Psychiatric/Behavioral: Negative for agitation and confusion.     Patient Active Problem List   Diagnosis Date Noted  . Atrial fibrillation with rapid ventricular response (HCC) 07/26/2019  . Rash 12/30/2017  . Healthcare maintenance 09/07/2017  . Osteopenia after menopause 06/09/2017  . Uncontrolled type 2 diabetes mellitus with hyperglycemia (HCC) 06/08/2017  . Back pain 11/15/2014  . Pericardial effusion 05/12/2010  . HTN (hypertension) 05/12/2010  . Mixed hyperlipidemia 05/12/2010    Past Medical History: Past Medical History:  Diagnosis Date  . Abdominal pain   . Back pain   . Constipation   . Fatigue   . Full dentures   . Hemorrhoids   .  Hypertension   . Lack of adequate sleep   . Liver mass   . Rash    on right breast  . Wears glasses     Past Surgical History: Past Surgical History:  Procedure Laterality Date  . ABDOMINAL HYSTERECTOMY    . BREAST DUCTAL SYSTEM EXCISION    . BREAST EXCISIONAL BIOPSY Left   . CHOLECYSTECTOMY, LAPAROSCOPIC  07/24/2010  . ESOPHAGEAL DILATION      Social History: Social History   Tobacco Use  . Smoking status: Never Smoker  . Smokeless tobacco: Never Used  Vaping Use  . Vaping Use: Never used  Substance Use Topics  . Alcohol use: No    Alcohol/week: 0.0 standard drinks  . Drug use: No    Please also refer to relevant sections of EMR.  Family History: Family  History  Problem Relation Age of Onset  . Stroke Mother   . Other Father        broken heart after spouse spouse  . Diabetes Sister   . Hypertension Sister   . Diabetes Sister   . Multiple sclerosis Sister   . Alcohol abuse Other   . Diabetes Other   . Breast cancer Neg Hx      Allergies and Medications: Allergies  Allergen Reactions  . Fruit & Vegetable Daily [Nutritional Supplements] Swelling and Other (See Comments)    All fruits cause tongue swelling and numbness  . Other Swelling    Tree nuts cause tongue swelling and numbness  . Peanut-Containing Drug Products Swelling and Other (See Comments)    Tongue swelling and numbness  . Aspirin Nausea And Vomiting and Other (See Comments)    Cannot take due to liver issues  . Tylenol [Acetaminophen] Other (See Comments)    Cannot take due to liver issues   No current facility-administered medications on file prior to encounter.   Current Outpatient Medications on File Prior to Encounter  Medication Sig Dispense Refill  . Semaglutide (OZEMPIC, 0.25 OR 0.5 MG/DOSE, [place]) Inject 0.25 mg into the skin once a week.    Marland Kitchen atorvastatin (LIPITOR) 20 MG tablet TAKE 1/2 TABLET(10 MG) BY MOUTH DAILY AT 6 PM (Patient not taking: Reported on 01/24/2019) 30 tablet 11  . docusate sodium (COLACE) 100 MG capsule Take 200 mg by mouth 2 (two) times daily. (Patient not taking: Reported on 07/26/2019)    . fexofenadine (ALLEGRA) 180 MG tablet Take 180 mg by mouth daily. (Patient not taking: Reported on 07/26/2019)    . glucose blood (ACCU-CHEK AVIVA) test strip Use as instructed (Patient not taking: Reported on 07/26/2019) 100 each 12  . Lancets (ACCU-CHEK SOFT TOUCH) lancets Once daily testing. (Patient not taking: Reported on 07/26/2019) 100 each 12  . nystatin cream (MYCOSTATIN) APPLY EXTERNALLY TO THE AFFECTED AREA TWICE DAILY (Patient not taking: Reported on 07/26/2019) 45 g 2  . olmesartan (BENICAR) 5 MG tablet Take 1 tablet (5 mg total) by mouth daily.  (Patient not taking: Reported on 01/24/2019) 30 tablet 11  . omeprazole (PRILOSEC) 20 MG capsule Take 20 mg by mouth daily. (Patient not taking: Reported on 07/26/2019)    . ondansetron (ZOFRAN) 4 MG tablet Take 1 tablet (4 mg total) by mouth every 8 (eight) hours as needed for nausea or vomiting. (Patient not taking: Reported on 07/26/2019) 20 tablet 0  . PARoxetine (PAXIL) 10 MG tablet TAKE 1 TABLET BY MOUTH EVERY DAY (Patient not taking: Reported on 01/24/2019) 90 tablet 0  . [DISCONTINUED] metFORMIN (GLUCOPHAGE) 1000 MG  tablet Take 1,000 mg by mouth 2 (two) times daily.    . [DISCONTINUED] OZEMPIC, 0.25 OR 0.5 MG/DOSE, 2 MG/1.5ML SOPN Inject 0.5 mg into the skin once a week. 1.5 mL 3    Objective: BP (!) 181/132   [name] (!) 41   Temp 98.5 F (36.9 C) (Oral)   Resp (!) 22   Ht 5' 2.99" (1.6 m)   Wt 80.1 kg   SpO2 98%   BMI 31.29 kg/m  Exam: General: Patient in no acute distress HEENT: Mucous membranes moist, EOMI, no goiter appreciated Neck: no JVD noted Cardiovascular: Tachycardic with irregularly irregular rhythm, no murmurs, gallops or rubs noted. Respiratory: lungs clear to auscultation bilaterally  Gastrointestinal: soft, nondistended, active bowel sounds  MSK: 1+ pitting edema equal bilaterally approximately to level of mid shin, no deformities noted Derm: skin warm and dry to touch, no rashes Neuro: Alert and oriented, able to follow simple commands, speech easily understandable without slurring, moves all extremities spontaneously and equally Psych: mood appropriate, no agitation noted   Labs and Imaging: CBC BMET  Recent Labs  Lab 07/26/19 1318  WBC 7.7  HGB 12.9  HCT 42.3  PLT 220   Recent Labs  Lab 07/26/19 1318  NA 138  K 4.5  CL 100  CO2 26  BUN 9  CREATININE 0.93  GLUCOSE 381*  CALCIUM 8.8*     EKG: tachycardia with little to no discernible p waves   DG Chest Port 1 View  Result Date: 07/26/2019 CLINICAL DATA:  Atrial fibrillation EXAM: PORTABLE  CHEST 1 VIEW COMPARISON:  09/11/2013 FINDINGS: Mild cardiomegaly. No focal airspace consolidation, pleural effusion, or pneumothorax. Osseous structures are within normal limits. IMPRESSION: No active disease. Electronically Signed   By: Duanne Guess D.O.   On: 07/26/2019 14:10    Reece Leader, DO 07/26/2019, 4:14 PM PGY-1, [facility] Family Medicine FPTS Intern pager: [phone], text pages welcome  FPTS Upper-Level Resident Addendum   I have independently interviewed and examined the patient. I have discussed the above with the original author and agree with their documentation. My edits for correction/addition/clarification are added. Please see also any attending notes.    Allayne Stack, DO  Family Medicine PGY-3

## 2019-07-26 NOTE — ED Triage Notes (Signed)
Pt has been off her medication x 1 year.  Family took her to PCP to make sure blood sugars ok prior to going to [place] in a few weeks.  Pt's HR 160-170.  Denies pain.  States she feels "heart beating in her back".  States she feels fine other than that.  Pt states, "I've still been taking my shot."

## 2019-07-26 NOTE — Progress Notes (Signed)
ANTICOAGULATION CONSULT NOTE  Pharmacy Consult for Heparin Indication: atrial fibrillation  Allergies  Allergen Reactions  . Fruit & Vegetable Daily [Nutritional Supplements] Swelling and Other (See Comments)    All fruits cause tongue swelling and numbness  . Other Swelling    Tree nuts cause tongue swelling and numbness  . Peanut-Containing Drug Products Swelling and Other (See Comments)    Tongue swelling and numbness  . Aspirin Nausea And Vomiting and Other (See Comments)    Cannot take due to liver issues  . Tylenol [Acetaminophen] Other (See Comments)    Cannot take due to liver issues    Patient Measurements: Height: 5' 2.99" (160 cm) Weight: 80.1 kg (176 lb 9.4 oz) IBW/kg (Calculated) : 52.38 Heparin Dosing Weight: 69.9kg  Vital Signs: Temp: 98 F (36.7 C) (07/15 1947) Temp Source: Oral (07/15 1947) BP: 158/110 (07/15 2200) Pulse Rate: 127 (07/15 2200)  Labs: Recent Labs    07/26/19 1318 07/26/19 1612 07/26/19 2149  HGB 12.9  --   --   HCT 42.3  --   --   PLT 220  --   --   HEPARINUNFRC  --   --  0.48  CREATININE 0.93  --   --   TROPONINIHS 20* 18*  --     Estimated Creatinine Clearance: 58.8 mL/min (by C-G formula based on SCr of 0.93 mg/dL).  Assessment: Patient is a 69yo female who presents with atrial fibrillation and started on IV heparin.  Heparin level is therapeutic; no bleeding reported.  Goal of Therapy:  Heparin level 0.3-0.7 units/ml Monitor platelets by anticoagulation protocol: Yes   Plan:  Continue heparin gtt at 1050 units/hr F/U AM labs  Dyland Panuco D. Laney Potash, PharmD, BCPS, BCCCP 07/26/2019, 10:32 PM

## 2019-07-26 NOTE — ED Provider Notes (Signed)
[facility] EMERGENCY DEPARTMENT Provider Note   CSN: [id number] Arrival date & time: 07/26/19  1246     History Chief Complaint  Patient presents with  . Tachycardia    [name] [name] is a 69 y.o. female.  HPI   This patient is a 69 year old female, she does have a history of hypertension, she has a history of a pericardial effusion and mixed hyperlipidemia as well as a history of diabetes on Metformin and Ozempic.  She presents to the hospital with a complaint of weakness and a rapid heartbeat after being seen at her doctor's office today.  She was sent over from the family practice here at [facility] with a diagnosis of tachycardia likely atrial fibrillation.  She feels that approximately 1 week ago she became weak and felt some palpitations but the palpitations have not continued however the weakness has.  She denies chest pain, coughing or shortness of breath at this time, she denies any swelling of the legs and has had no fevers chills nausea vomiting or diarrhea.  No rectal bleeding.  No swelling of the legs.  No headache.  No prior history of cardiac disease or arrhythmia and she does not currently take any anticoagulants.  Her symptoms have been persistent, nothing seems to make this better or worse, it is not associated with a feeling of chest pain back pain or palpitations at this time  Past Medical History:  Diagnosis Date  . Abdominal pain   . Back pain   . Constipation   . Fatigue   . Full dentures   . Hemorrhoids   . Hypertension   . Lack of adequate sleep   . Liver mass   . Rash    on right breast  . Wears glasses     Patient Active Problem List   Diagnosis Date Noted  . Rash 12/30/2017  . Healthcare maintenance 09/07/2017  . Osteopenia after menopause 06/09/2017  . Uncontrolled type 2 diabetes mellitus with hyperglycemia (HCC) 06/08/2017  . Back pain 11/15/2014  . Pericardial effusion 05/12/2010  . HTN (hypertension)  05/12/2010  . Mixed hyperlipidemia 05/12/2010    Past Surgical History:  Procedure Laterality Date  . ABDOMINAL HYSTERECTOMY    . BREAST DUCTAL SYSTEM EXCISION    . BREAST EXCISIONAL BIOPSY Left   . CHOLECYSTECTOMY, LAPAROSCOPIC  07/24/2010  . ESOPHAGEAL DILATION       OB History   No obstetric history on file.     Family History  Problem Relation Age of Onset  . Stroke Mother   . Other Father        broken heart after spouse spouse  . Diabetes Sister   . Hypertension Sister   . Diabetes Sister   . Multiple sclerosis Sister   . Alcohol abuse Other   . Diabetes Other   . Breast cancer Neg Hx     Social History   Tobacco Use  . Smoking status: Never Smoker  . Smokeless tobacco: Never Used  Vaping Use  . Vaping Use: Never used  Substance Use Topics  . Alcohol use: No    Alcohol/week: 0.0 standard drinks  . Drug use: No    Home Medications Prior to Admission medications   Medication Sig Start Date End Date Taking? Authorizing Provider  Semaglutide (OZEMPIC, 0.25 OR 0.5 MG/DOSE, [place]) Inject 0.25 mg into the skin once a week.   Yes [provider]  atorvastatin (LIPITOR) 20 MG tablet TAKE 1/2 TABLET(10 MG) BY  MOUTH DAILY AT 6 PM Patient not taking: Reported on 01/24/2019 11/17/18   Lennox Solders, MD  docusate sodium (COLACE) 100 MG capsule Take 200 mg by mouth 2 (two) times daily. Patient not taking: Reported on 07/26/2019    [provider]  fexofenadine (ALLEGRA) 180 MG tablet Take 180 mg by mouth daily. Patient not taking: Reported on 07/26/2019    [provider]  glucose blood (ACCU-CHEK AVIVA) test strip Use as instructed Patient not taking: Reported on 07/26/2019 06/16/17   Moses Manners, MD  Lancets (ACCU-CHEK SOFT TOUCH) lancets Once daily testing. Patient not taking: Reported on 07/26/2019 06/16/17   Moses Manners, MD  nystatin cream (MYCOSTATIN) APPLY EXTERNALLY TO THE AFFECTED AREA TWICE DAILY Patient not taking: Reported  on 07/26/2019 12/30/17   Lennox Solders, MD  olmesartan (BENICAR) 5 MG tablet Take 1 tablet (5 mg total) by mouth daily. Patient not taking: Reported on 01/24/2019 09/06/17   Lennox Solders, MD  omeprazole (PRILOSEC) 20 MG capsule Take 20 mg by mouth daily. Patient not taking: Reported on 07/26/2019    [provider]  ondansetron (ZOFRAN) 4 MG tablet Take 1 tablet (4 mg total) by mouth every 8 (eight) hours as needed for nausea or vomiting. Patient not taking: Reported on 07/26/2019 01/31/19   Lennox Solders, MD  PARoxetine (PAXIL) 10 MG tablet TAKE 1 TABLET BY MOUTH EVERY DAY Patient not taking: Reported on 01/24/2019 11/14/17   Arnette Felts, FNP  metFORMIN (GLUCOPHAGE) 1000 MG tablet Take 1,000 mg by mouth 2 (two) times daily. 08/25/13   [provider]  OZEMPIC, 0.25 OR 0.5 MG/DOSE, 2 MG/1.5ML SOPN Inject 0.5 mg into the skin once a week. 06/02/18   Lennox Solders, MD    Allergies    Fruit & vegetable daily [nutritional supplements], Other, Peanut-containing drug products, Aspirin, and Tylenol [acetaminophen]  Review of Systems   Review of Systems  All other systems reviewed and are negative.   Physical Exam Updated Vital Signs BP (!) 181/132   Pulse (!) 41   Temp 98.5 F (36.9 C) (Oral)   Resp (!) 22   SpO2 98%   Physical Exam Vitals and nursing note reviewed.  Constitutional:      General: She is in acute distress.     Appearance: She is well-developed.  HENT:     Head: Normocephalic and atraumatic.     Mouth/Throat:     Pharynx: No oropharyngeal exudate.  Eyes:     General: No scleral icterus.       Right eye: No discharge.        Left eye: No discharge.     Conjunctiva/sclera: Conjunctivae normal.     Pupils: Pupils are equal, round, and reactive to light.  Neck:     Thyroid: No thyromegaly.     Vascular: No JVD.  Cardiovascular:     Rate and Rhythm: Tachycardia present. Rhythm irregular.     Heart sounds: Normal heart sounds. No murmur  heard.  No friction rub. No gallop.   Pulmonary:     Effort: Pulmonary effort is normal. No respiratory distress.     Breath sounds: Normal breath sounds. No wheezing or rales.  Abdominal:     General: Bowel sounds are normal. There is no distension.     Palpations: Abdomen is soft. There is no mass.     Tenderness: There is no abdominal tenderness.  Musculoskeletal:        General: No tenderness.  Normal range of motion.     Cervical back: Normal range of motion and neck supple.  Lymphadenopathy:     Cervical: No cervical adenopathy.  Skin:    General: Skin is warm and dry.     Findings: No erythema or rash.  Neurological:     Mental Status: She is alert.     Coordination: Coordination normal.  Psychiatric:        Behavior: Behavior normal.     ED Results / Procedures / Treatments   Labs (all labs ordered are listed, but only abnormal results are displayed) Labs Reviewed  CBC - Abnormal; Notable for the following components:      Result Value   RBC 5.12 (*)    MCH 25.2 (*)    All other components within normal limits  CBG MONITORING, ED - Abnormal; Notable for the following components:   Glucose-Capillary 331 (*)    All other components within normal limits  SARS CORONAVIRUS 2 BY RT PCR (HOSPITAL ORDER, PERFORMED IN [facility])  BASIC METABOLIC PANEL  TSH  TROPONIN I (HIGH SENSITIVITY)    EKG None  Radiology DG Chest Port 1 View  Result Date: 07/26/2019 CLINICAL DATA:  Atrial fibrillation EXAM: PORTABLE CHEST 1 VIEW COMPARISON:  09/11/2013 FINDINGS: Mild cardiomegaly. No focal airspace consolidation, pleural effusion, or pneumothorax. Osseous structures are within normal limits. IMPRESSION: No active disease. Electronically Signed   By: Duanne Guess D.O.   On: 07/26/2019 14:10    Procedures .Critical Care Performed by: Eber Hong, MD Authorized by: Eber Hong, MD   Critical care provider statement:    Critical care time (minutes):   35   Critical care time was exclusive of:  Separately billable procedures and treating other patients and teaching time   Critical care was necessary to treat or prevent imminent or life-threatening deterioration of the following conditions:  Cardiac failure   Critical care was time spent personally by me on the following activities:  Blood draw for specimens, development of treatment plan with patient or surrogate, discussions with consultants, evaluation of patient's response to treatment, examination of patient, obtaining history from patient or surrogate, ordering and performing treatments and interventions, ordering and review of laboratory studies, ordering and review of radiographic studies, pulse oximetry, re-evaluation of patient's condition and review of old charts   (including critical care time)  Medications Ordered in ED Medications  sodium chloride flush (NS) 0.9 % injection 3 mL (has no administration in time range)  diltiazem (CARDIZEM) 1 mg/mL load via infusion 20 mg (20 mg Intravenous Bolus from Bag 07/26/19 1408)    And  diltiazem (CARDIZEM) 125 mg in dextrose 5% 125 mL (1 mg/mL) infusion (5 mg/hr Intravenous New Bag/Given 07/26/19 1407)    ED Course  I have reviewed the triage vital signs and the nursing notes.  Pertinent labs & imaging results that were available during my care of the patient were reviewed by me and considered in my medical decision making (see chart for details).    MDM Rules/Calculators/A&P                          This patient is in atrial fibrillation with a rapid ventricular rate.  She is hypertensive with a blood pressure of 159/115 and a heart rate of 162.  She is afebrile, her oxygen is 99% and she has clear lung sounds.  I will check a thyroid panel as well as some basic  labs, she will need rate control and anticoagulation given her risk for Blood clot.  Please see scoring system below  The patient denies substance abuse, cocaine use, alcohol or  tobacco of any significant degree, she has been taking an allergy medication but no other over-the-counter stimulants, no known history of thyroid dysfunction  This patient is critically ill requiring a Cardizem drip for rate control due to severe tachycardia  CHA2DS2-VASc Score = 4  The patient's score is based upon: CHF History: 0 HTN History: 1 Age : 1 Diabetes History: 1 Stroke History: 0 Vascular Disease History: 0 Gender: 1      ASSESSMENT AND PLAN: Persistent Atrial Fibrillation (ICD10:  I48.19) The patient's CHA2DS2-VASc score is 4, indicating a 4.8% annual risk of stroke.    Secondary Hypercoagulable State (ICD10:  D68.69) The patient is at significant risk for stroke/thromboembolism based upon her CHA2DS2-VASc Score of 4.  Start Heparin.   Chest x-ray, labs, EKG, cardiac monitoring and consultation with hospitalist for admission.  [name] [name] was evaluated in Emergency Department on 07/26/2019 for the symptoms described in the history of present illness. She was evaluated in the context of the global COVID-19 pandemic, which necessitated consideration that the patient might be at risk for infection with the SARS-CoV-2 virus that causes COVID-19. Institutional protocols and algorithms that pertain to the evaluation of patients at risk for COVID-19 are in a state of rapid change based on information released by regulatory bodies including the CDC and federal and state organizations. These policies and algorithms were followed during the patient's care in the ED.  D/w FP resident - they will admit  Signed,  Vida Roller, MD    07/26/2019 2:14 PM      Final Clinical Impression(s) / ED Diagnoses Final diagnoses:  Atrial fibrillation with rapid ventricular response (HCC)      Eber Hong, MD 07/26/19 1415

## 2019-07-27 ENCOUNTER — Observation Stay (HOSPITAL_COMMUNITY): Payer: Medicare Other

## 2019-07-27 DIAGNOSIS — E1165 Type 2 diabetes mellitus with hyperglycemia: Secondary | ICD-10-CM | POA: Diagnosis not present

## 2019-07-27 DIAGNOSIS — I471 Supraventricular tachycardia: Secondary | ICD-10-CM | POA: Diagnosis not present

## 2019-07-27 DIAGNOSIS — I2510 Atherosclerotic heart disease of native coronary artery without angina pectoris: Secondary | ICD-10-CM | POA: Diagnosis present

## 2019-07-27 DIAGNOSIS — Z91018 Allergy to other foods: Secondary | ICD-10-CM | POA: Diagnosis not present

## 2019-07-27 DIAGNOSIS — I5041 Acute combined systolic (congestive) and diastolic (congestive) heart failure: Secondary | ICD-10-CM | POA: Diagnosis not present

## 2019-07-27 DIAGNOSIS — I4891 Unspecified atrial fibrillation: Secondary | ICD-10-CM | POA: Diagnosis not present

## 2019-07-27 DIAGNOSIS — I484 Atypical atrial flutter: Secondary | ICD-10-CM | POA: Diagnosis present

## 2019-07-27 DIAGNOSIS — Z6831 Body mass index (BMI) 31.0-31.9, adult: Secondary | ICD-10-CM | POA: Diagnosis not present

## 2019-07-27 DIAGNOSIS — Z20822 Contact with and (suspected) exposure to covid-19: Secondary | ICD-10-CM | POA: Diagnosis not present

## 2019-07-27 DIAGNOSIS — R9431 Abnormal electrocardiogram [ECG] [EKG]: Secondary | ICD-10-CM | POA: Diagnosis not present

## 2019-07-27 DIAGNOSIS — F319 Bipolar disorder, unspecified: Secondary | ICD-10-CM | POA: Diagnosis present

## 2019-07-27 DIAGNOSIS — Z79899 Other long term (current) drug therapy: Secondary | ICD-10-CM | POA: Diagnosis not present

## 2019-07-27 DIAGNOSIS — Z91010 Allergy to peanuts: Secondary | ICD-10-CM | POA: Diagnosis not present

## 2019-07-27 DIAGNOSIS — R002 Palpitations: Secondary | ICD-10-CM | POA: Diagnosis present

## 2019-07-27 DIAGNOSIS — I10 Essential (primary) hypertension: Secondary | ICD-10-CM | POA: Diagnosis not present

## 2019-07-27 DIAGNOSIS — E782 Mixed hyperlipidemia: Secondary | ICD-10-CM | POA: Diagnosis present

## 2019-07-27 DIAGNOSIS — I361 Nonrheumatic tricuspid (valve) insufficiency: Secondary | ICD-10-CM | POA: Diagnosis not present

## 2019-07-27 DIAGNOSIS — M8580 Other specified disorders of bone density and structure, unspecified site: Secondary | ICD-10-CM | POA: Diagnosis present

## 2019-07-27 DIAGNOSIS — G4733 Obstructive sleep apnea (adult) (pediatric): Secondary | ICD-10-CM | POA: Diagnosis present

## 2019-07-27 DIAGNOSIS — I480 Paroxysmal atrial fibrillation: Secondary | ICD-10-CM | POA: Diagnosis not present

## 2019-07-27 DIAGNOSIS — Z886 Allergy status to analgesic agent status: Secondary | ICD-10-CM | POA: Diagnosis not present

## 2019-07-27 DIAGNOSIS — I110 Hypertensive heart disease with heart failure: Secondary | ICD-10-CM | POA: Diagnosis not present

## 2019-07-27 DIAGNOSIS — I495 Sick sinus syndrome: Secondary | ICD-10-CM | POA: Diagnosis present

## 2019-07-27 LAB — CBC
HCT: 41.9 % (ref 36.0–46.0)
Hemoglobin: 12.8 g/dL (ref 12.0–15.0)
MCH: 25.1 pg — ABNORMAL LOW (ref 26.0–34.0)
MCHC: 30.5 g/dL (ref 30.0–36.0)
MCV: 82.2 fL (ref 80.0–100.0)
Platelets: 191 10*3/uL (ref 150–400)
RBC: 5.1 MIL/uL (ref 3.87–5.11)
RDW: 14.1 % (ref 11.5–15.5)
WBC: 8 10*3/uL (ref 4.0–10.5)
nRBC: 0 % (ref 0.0–0.2)

## 2019-07-27 LAB — ECHOCARDIOGRAM COMPLETE
AR max vel: 0.76 cm2
AV Peak grad: 25 mmHg
Ao pk vel: 2.5 m/s
Area-P 1/2: 4.07 cm2
Calc EF: 32.3 %
Height: 62.992 in
S' Lateral: 2.6 cm
Single Plane A2C EF: 23.5 %
Single Plane A4C EF: 42.9 %
Weight: 2814.83 oz

## 2019-07-27 LAB — COMPREHENSIVE METABOLIC PANEL
ALT: 66 U/L — ABNORMAL HIGH (ref 0–44)
AST: 28 U/L (ref 15–41)
Albumin: 2.7 g/dL — ABNORMAL LOW (ref 3.5–5.0)
Alkaline Phosphatase: 94 U/L (ref 38–126)
Anion gap: 12 (ref 5–15)
BUN: 8 mg/dL (ref 8–23)
CO2: 24 mmol/L (ref 22–32)
Calcium: 8.2 mg/dL — ABNORMAL LOW (ref 8.9–10.3)
Chloride: 101 mmol/L (ref 98–111)
Creatinine, Ser: 0.71 mg/dL (ref 0.44–1.00)
GFR calc Af Amer: >60 mL/min (ref >60)
GFR calc non Af Amer: >60 mL/min (ref >60)
Glucose, Bld: 330 mg/dL — ABNORMAL HIGH (ref 70–99)
Potassium: 3.7 mmol/L (ref 3.5–5.1)
Sodium: 137 mmol/L (ref 135–145)
Total Bilirubin: 0.4 mg/dL (ref 0.3–1.2)
Total Protein: 6.6 g/dL (ref 6.5–8.1)

## 2019-07-27 LAB — GLUCOSE, CAPILLARY
Glucose-Capillary: 182 mg/dL — ABNORMAL HIGH (ref 70–99)
Glucose-Capillary: 454 mg/dL — ABNORMAL HIGH (ref 70–99)
Glucose-Capillary: 197 mg/dL — ABNORMAL HIGH (ref 70–99)
Glucose-Capillary: 307 mg/dL — ABNORMAL HIGH (ref 70–99)
Glucose-Capillary: 154 mg/dL — ABNORMAL HIGH (ref 70–99)

## 2019-07-27 LAB — MAGNESIUM: Magnesium: 1.5 mg/dL — ABNORMAL LOW (ref 1.7–2.4)

## 2019-07-27 LAB — MRSA PCR SCREENING: MRSA by PCR: NEGATIVE

## 2019-07-27 LAB — HEPARIN LEVEL (UNFRACTIONATED): Heparin Unfractionated: 0.42 IU/mL (ref 0.30–0.70)

## 2019-07-27 MED ORDER — MELATONIN 5 MG PO TABS
5.0000 mg | ORAL_TABLET | Freq: Every day | ORAL | Status: DC
  Administered 2019-07-27 – 2019-07-31 (×5): 5 mg via ORAL
  Filled 2019-07-27 (×5): qty 1

## 2019-07-27 MED ORDER — METOPROLOL TARTRATE 25 MG PO TABS
25.0000 mg | ORAL_TABLET | Freq: Once | ORAL | Status: AC
  Administered 2019-07-27: 25 mg via ORAL
  Filled 2019-07-27: qty 1

## 2019-07-27 MED ORDER — INSULIN ASPART 100 UNIT/ML ~~LOC~~ SOLN: 5.0000 [IU] | Freq: Once | SUBCUTANEOUS | Status: DC

## 2019-07-27 MED ORDER — INSULIN GLARGINE 100 UNIT/ML ~~LOC~~ SOLN
20.0000 [IU] | Freq: Every day | SUBCUTANEOUS | Status: DC
  Administered 2019-07-27: 20 [IU] via SUBCUTANEOUS
  Filled 2019-07-27 (×3): qty 0.2

## 2019-07-27 MED ORDER — METOPROLOL TARTRATE 50 MG PO TABS
50.0000 mg | ORAL_TABLET | Freq: Two times a day (BID) | ORAL | Status: DC
  Administered 2019-07-27 – 2019-07-28 (×2): 50 mg via ORAL
  Filled 2019-07-27 (×2): qty 1

## 2019-07-27 MED ORDER — FREESTYLE LIBRE 2 READER DEVI: 1.0000 | Freq: Once | 0 refills | Status: AC

## 2019-07-27 MED ORDER — INSULIN ASPART 100 UNIT/ML ~~LOC~~ SOLN
0.0000 [IU] | Freq: Three times a day (TID) | SUBCUTANEOUS | Status: DC
  Administered 2019-07-27: 3 [IU] via SUBCUTANEOUS
  Administered 2019-07-27: 11 [IU] via SUBCUTANEOUS
  Administered 2019-07-28 (×2): 5 [IU] via SUBCUTANEOUS
  Administered 2019-07-28 – 2019-07-29 (×4): 3 [IU] via SUBCUTANEOUS
  Administered 2019-07-30: 5 [IU] via SUBCUTANEOUS
  Administered 2019-07-30: 3 [IU] via SUBCUTANEOUS
  Administered 2019-07-31 – 2019-08-01 (×2): 5 [IU] via SUBCUTANEOUS
  Administered 2019-08-01: 2 [IU] via SUBCUTANEOUS

## 2019-07-27 MED ORDER — FREESTYLE LIBRE 2 SENSOR MISC: 1.0000 | Freq: Once | 0 refills | Status: AC

## 2019-07-27 MED ORDER — ACETAMINOPHEN 160 MG/5ML PO SOLN
650.0000 mg | Freq: Four times a day (QID) | ORAL | Status: DC | PRN
  Administered 2019-07-27: 650 mg via ORAL
  Filled 2019-07-27: qty 20.3

## 2019-07-27 MED ORDER — MAGNESIUM SULFATE 2 GM/50ML IV SOLN
2.0000 g | Freq: Once | INTRAVENOUS | Status: AC
  Administered 2019-07-27: 2 g via INTRAVENOUS
  Filled 2019-07-27: qty 50

## 2019-07-27 NOTE — Progress Notes (Addendum)
Family Medicine Teaching Service Daily Progress Note Intern Pager: [phone]  Patient name: [name] [name] Medical record number: [id number] Date of birth: 15-Sep-1950 Age: 69 y.o. Gender: female  Primary Care Provider: Dana Allan, MD Consultants: Cardiology  Code Status: Full  Pt Overview and Major Events to Date:  Admitted 7/15  Assessment and Plan: [name] [name] is a 69 year old female presenting with palpitations and weakness.  PMH significant for hypertension, diabetes, history of pericardial effusion, mixed hyperlipidemia.  New onset atrial fibrillation with RVR: Acute, improving. Could not sleep well overnight due to fast heart rate.  Currently on IV diltiazem drip at max dose.  Increased metoprolol from 25 mg to 50 mg twice daily (started this morning).  Last 24 hours, pulse 123-132.  Still in A. fib with RVR.  Magnesium this morning 1.5, gave 2 g IV magnesium.  SPO2 this morning 94-98%.   -PT/OT, appreciate recs -Follow-up magnesium level tomorrow morning -Follow-up routine vitals -We will need to follow-up outpatient with cardiology -We will also need sleep study to rule out OSA (history of nighttime snoring, elevated BMI)  Hypertension: Chronic, poorly controlled. This morning, BP systolic 125-132, diastolic 75-89.  Lopressor and diltiazem drip as above. -Continue to monitor BP  Type 2 Diabetes: Chronic, poorly controlled. Admission A1c >15, CBG 381.  This morning, FS glucose 454, given 9 units of insulin per sliding scale.  This morning, increased sliding scale from sensitive to regular and daily Lantus from 10 to 20 units.  Saw the diabetes coordinator this morning, told her she would like to start insulin at home. -Continue to follow FS glucose -We will work with diabetes coordinator to facilitate going home with insulin                                                                                        Hyperlipidemia: Chronic, stable. History of mixed  hyperlipidemia.  Prescribed atorvastatin at home, not taking. -Follow-up lipid panel results tomorrow morning -Consider restarting atorvastatin for primary prevention, setting of DM  H/o Small Pericardial effusion History of pericardial effusion in 2013.  Lost to follow-up.  Echo showed trivial circumferential pericardial effusion, LVEF 40 to 45%, LV global hypokinesis, moderately elevated pulmonary artery systolic pressure, moderate tricuspid regurgitation. -Appreciate cardiology recommendations -Will need outpatient cardiology follow-up   FEN/GI: Modified carb diet PPx: Heparin GTT  Disposition: Cardiac telemetry  Subjective:  [name] [name] was found sitting up in bed this morning, alert and conversational.  She did not sleep well overnight, could not pinpoint why.  York Spaniel it felt like her heart was beating very fast all night.  No shortness of breath, palpitations, or feeling like her heart was skipping a beat.  She was feeling very tired, and had a small headache.  She asks for something to help her sleep. No other complaints.  Objective: Temp:  [97.8 F (36.6 C)-98.2 F (36.8 C)] 97.8 F (36.6 C) (07/16 0833) Pulse Rate:  [123-132] 126 (07/16 0833) Resp:  [14-26] 21 (07/16 0833) BP: (125-180)/(75-118) 132/85 (07/16 0833) SpO2:  [94 %-98 %] 98 % (07/16 0833) Weight:  [79.8 kg] 79.8 kg (07/16 9024)  Physical Exam: General: Awake, alert,  oriented Cardiovascular: Irregularly irregular, tachycardic, no murmurs auscultated Respiratory: Clear to auscultation bilaterally Abdomen: Soft, nondistended Extremities: No BLE edema  Laboratory: Recent Labs  Lab 07/26/19 1318 07/27/19 0449  WBC 7.7 8.0  HGB 12.9 12.8  HCT 42.3 41.9  PLT 220 191   Recent Labs  Lab 07/26/19 1318 07/27/19 0449  NA 138 137  K 4.5 3.7  CL 100 101  CO2 26 24  BUN 9 8  CREATININE 0.93 0.71  CALCIUM 8.8* 8.2*  PROT  --  6.6  BILITOT  --  0.4  ALKPHOS  --  94  ALT  --  66*  AST  --  28  GLUCOSE  381* 330*    Imaging/Diagnostic Tests: ECHOCARDIOGRAM COMPLETE  Result Date: 07/27/2019    ECHOCARDIOGRAM REPORT   Patient Name:   [name] [name] Date of Exam: 07/27/2019 Medical Rec #:  [id number]         Height:       63.0 in Accession #:    [id number]        Weight:       175.9 lb Date of Birth:  1950/12/10        BSA:          1.831 m Patient Age:    68 years          BP:           132/85 mmHg Patient Gender: F                 HR:           93 bpm. Exam Location:  Inpatient Procedure: 2D Echo, Cardiac Doppler and Color Doppler Indications:    R94.31 Abnormal EKG; I48.91* Unspeicified atrial fibrillation  History:        Patient has prior history of Echocardiogram examinations, most                 recent 02/23/2011. Abnormal ECG, Arrythmias:Atrial Fibrillation;                 Risk Factors:Hypertension, Diabetes and Dyslipidemia.                 Pericardial effusion.  Sonographer:    [name] RDCS Referring Phys: [phone] BRIAN MILLER IMPRESSIONS  1. Trivial pericardial effusion is present. The pericardial effusion is circumferential.  2. Left ventricular ejection fraction, by estimation, is 40 to 45%. The left ventricle has mildly decreased function. The left ventricle demonstrates global hypokinesis. There is moderate left ventricular hypertrophy. Left ventricular diastolic parameters are indeterminate.  3. Right ventricular systolic function is normal. The right ventricular size is normal. There is moderately elevated pulmonary artery systolic pressure. The estimated right ventricular systolic pressure is 59.1 mmHg.  4. The mitral valve is normal in structure. Trivial mitral valve regurgitation. No evidence of mitral stenosis.  5. Tricuspid valve regurgitation is moderate.  6. The aortic valve is tricuspid. Aortic valve regurgitation is not visualized. Mild aortic valve sclerosis is present, with no evidence of aortic valve stenosis.  7. The inferior vena cava is normal in size with greater than 50%  respiratory variability, suggesting right atrial pressure of 3 mmHg. FINDINGS  Left Ventricle: Left ventricular ejection fraction, by estimation, is 40 to 45%. The left ventricle has mildly decreased function. The left ventricle demonstrates global hypokinesis. The left ventricular internal cavity size was normal in size. There is  moderate left ventricular hypertrophy. Left ventricular diastolic parameters are indeterminate. Right Ventricle: The  right ventricular size is normal. No increase in right ventricular wall thickness. Right ventricular systolic function is normal. There is moderately elevated pulmonary artery systolic pressure. The tricuspid regurgitant velocity is 3.32 m/s, and with an assumed right atrial pressure of 15 mmHg, the estimated right ventricular systolic pressure is 59.1 mmHg. Left Atrium: Left atrial size was normal in size. Right Atrium: Right atrial size was normal in size. Pericardium: Trivial pericardial effusion is present. The pericardial effusion is circumferential. There is no evidence of cardiac tamponade. Mitral Valve: The mitral valve is normal in structure. Normal mobility of the mitral valve leaflets. Trivial mitral valve regurgitation. No evidence of mitral valve stenosis. Tricuspid Valve: The tricuspid valve is normal in structure. Tricuspid valve regurgitation is moderate . No evidence of tricuspid stenosis. Aortic Valve: The aortic valve is tricuspid. Aortic valve regurgitation is not visualized. Mild aortic valve sclerosis is present, with no evidence of aortic valve stenosis. Aortic valve peak gradient measures 25.0 mmHg. Pulmonic Valve: The pulmonic valve was normal in structure. Pulmonic valve regurgitation is not visualized. No evidence of pulmonic stenosis. Aorta: The aortic root is normal in size and structure. Venous: The inferior vena cava is normal in size with greater than 50% respiratory variability, suggesting right atrial pressure of 3 mmHg. IAS/Shunts: No  atrial level shunt detected by color flow Doppler. Additional Comments: Trivial pericardial effusion is present. The pericardial effusion is circumferential.  LEFT VENTRICLE PLAX 2D LVIDd:         3.30 cm LVIDs:         2.60 cm LV PW:         1.50 cm LV IVS:        1.60 cm LVOT diam:     1.90 cm LV SV:         27 LV SV Index:   15 LVOT Area:     2.84 cm  LV Volumes (MOD) LV vol d, MOD A2C: 39.1 ml LV vol d, MOD A4C: 48.0 ml LV vol s, MOD A2C: 29.9 ml LV vol s, MOD A4C: 27.4 ml LV SV MOD A2C:     9.2 ml LV SV MOD A4C:     48.0 ml LV SV MOD BP:      14.1 ml RIGHT VENTRICLE            IVC RV S prime:     5.59 cm/s  IVC diam: 1.80 cm TAPSE (M-mode): 0.7 cm LEFT ATRIUM             Index       RIGHT ATRIUM           Index LA diam:        4.30 cm 2.35 cm/m  RA Area:     10.10 cm LA Vol (A2C):   26.6 ml 14.53 ml/m RA Volume:   19.70 ml  10.76 ml/m LA Vol (A4C):   41.6 ml 22.72 ml/m LA Biplane Vol: 35.7 ml 19.50 ml/m  AORTIC VALVE AV Area (Vmax): 0.76 cm AV Vmax:        250.00 cm/s AV Peak Grad:   25.0 mmHg LVOT Vmax:      67.20 cm/s LVOT Vmean:     51.600 cm/s LVOT VTI:       0.094 m  AORTA Ao Root diam: 3.60 cm Ao Asc diam:  3.30 cm MITRAL VALVE                TRICUSPID VALVE MV Area (PHT): 4.07 cm  TR Peak grad:   44.1 mmHg MV Decel Time: 187 msec     TR Vmax:        332.00 cm/s MV E velocity: 122.00 cm/s                             SHUNTS                             Systemic VTI:  0.09 m                             Systemic Diam: 1.90 cm Donato Schultz MD Electronically signed by Donato Schultz MD Signature Date/Time: 07/27/2019/2:04:58 PM    Final      Fayette Pho, MD 07/27/2019, 2:16 PM PGY-1, [facility] Family [facility] FPTS Intern pager: [phone], text pages welcome

## 2019-07-27 NOTE — Progress Notes (Signed)
  Echocardiogram 2D Echocardiogram has been performed.  [name] [name] 07/27/2019, 1:49 PM

## 2019-07-27 NOTE — CV Procedure (Signed)
2D echo attempted, but RN request to come back after lunch due to high heart rate.

## 2019-07-27 NOTE — Progress Notes (Signed)
Inpatient Diabetes Program Recommendations  AACE/ADA: New Consensus Statement on Inpatient Glycemic Control (2015)  Target Ranges:  Prepandial:   less than 140 mg/dL      Peak postprandial:   less than 180 mg/dL (1-2 hours)      Critically ill patients:  140 - 180 mg/dL   Lab Results  Component Value Date   GLUCAP 197 (H) 07/27/2019   HGBA1C >15.0 07/26/2019    Review of Glycemic Control  Diabetes history: DM 2 Outpatient Diabetes medications: Ozempic 0.25 mg weekly Current orders for Inpatient glycemic control:  Lantus 20 units Novolog 0-15 units tid + hs  A1c >15% Consult at MD request for education and assistance with insulin teaching.  Saw pt at bedside and simply explained how high her A1c was and that each oral medication can lower that percentage 1-2 points at the most and she would need insulin. Also described her body was not working like it use to and her genetics were working against her.   Pt is agreeable to taking insulin Daily at this time. Explained how the insulin pen maybe different from her Ozempic injections. Pt inquiring about the Freestyle Libre device for pts on medicare on insulin. I have the medicare preauthorization form to give to pt in addition to a reader device and sensor to give pt if MD agreeable and orders it (reader order # [id number], sensor order # [id number]).  Thanks,  Christena Deem RN, MSN, BC-ADM Inpatient Diabetes Coordinator Team Pager [phone] (8a-5p)

## 2019-07-27 NOTE — Progress Notes (Signed)
cbg 454, Dr. Morrie Sheldon notifed.  No new orders given.  9 units of novolog given SQ.

## 2019-07-27 NOTE — Progress Notes (Signed)
ANTICOAGULATION CONSULT NOTE  Pharmacy Consult for Heparin Indication: atrial fibrillation  Allergies  Allergen Reactions  . Fruit & Vegetable Daily [Nutritional Supplements] Swelling and Other (See Comments)    All fruits cause tongue swelling and numbness  . Other Swelling    Tree nuts cause tongue swelling and numbness  . Peanut-Containing Drug Products Swelling and Other (See Comments)    Tongue swelling and numbness  . Aspirin Nausea And Vomiting and Other (See Comments)    Cannot take due to liver issues  . Tylenol [Acetaminophen] Other (See Comments)    Cannot take due to liver issues    Patient Measurements: Height: 5' 2.99" (160 cm) Weight: 79.8 kg (175 lb 14.8 oz) IBW/kg (Calculated) : 52.38 Heparin Dosing Weight: 69.9kg  Vital Signs: Temp: 97.8 F (36.6 C) (07/16 0400) Temp Source: Oral (07/16 0400) BP: 129/82 (07/16 0400) Pulse Rate: 124 (07/16 0400)  Labs: Recent Labs    07/26/19 1318 07/26/19 1612 07/26/19 2149 07/27/19 0449  HGB 12.9  --   --  12.8  HCT 42.3  --   --  41.9  PLT 220  --   --  191  HEPARINUNFRC  --   --  0.48 0.42  CREATININE 0.93  --   --  0.71  TROPONINIHS 20* 18*  --   --     Estimated Creatinine Clearance: 68.3 mL/min (by C-G formula based on SCr of 0.71 mg/dL).  Assessment: Patient is a 69yo female who presents with atrial fibrillation and started on IV heparin.    Heparin level continues to be therapeutic; H/H, plt stable.  Goal of Therapy:  Heparin level 0.3-0.7 units/ml Monitor platelets by anticoagulation protocol: Yes   Plan:  Continue heparin gtt at 1050 units/hr Monitor daily HL, CBC/plt Monitor for signs/symptoms of bleeding   Alphia Moh, PharmD, BCPS, BCCP Clinical Pharmacist  Please check AMION for all [facility]acy phone numbers After 10:00 PM, call Main Pharmacy [phone]

## 2019-07-27 NOTE — Plan of Care (Signed)

## 2019-07-27 NOTE — Evaluation (Signed)
Physical Therapy Evaluation Patient Details Name: [name] [name] MRN: [id number] DOB: [birth year] Today's Date: 07/27/2019   History of Present Illness  Pt adm with afib with rvr. PMH -  HTN, DM, pericardial effusion  Clinical Impression  Pt doing well with mobility and no further PT needed.  Recommend pt continue amb in halls. Assist to manage lines if needed.       Follow Up Recommendations No PT follow up    Equipment Recommendations  None recommended by PT    Recommendations for Other Services       Precautions / Restrictions Precautions Precautions: Other (comment) Precaution Comments: watch HR      Mobility  Bed Mobility Overal bed mobility: Independent                Transfers Overall transfer level: Modified independent Equipment used: None                Ambulation/Gait Ambulation/Gait assistance: Modified independent (Device/Increase time) Gait Distance (Feet): 350 Feet Assistive device: None Gait Pattern/deviations: WFL(Within Functional Limits) Gait velocity: normal Gait velocity interpretation: >4.37 ft/sec, indicative of normal walking speed General Gait Details: Steady gait  Stairs            Wheelchair Mobility    Modified Rankin (Stroke Patients Only)       Balance Overall balance assessment: No apparent balance deficits (not formally assessed)                                           Pertinent Vitals/Pain Pain Assessment: No/denies pain    Home Living Family/patient expects to be discharged to:: Private residence Living Arrangements: Alone Available Help at Discharge: Family;Available PRN/intermittently Type of Home: Apartment Home Access: Level entry     Home Layout: One level Home Equipment: None      Prior Function Level of Independence: Independent               Hand Dominance        Extremity/Trunk Assessment   Upper Extremity Assessment Upper Extremity Assessment:  Overall WFL for tasks assessed    Lower Extremity Assessment Lower Extremity Assessment: Overall WFL for tasks assessed       Communication   Communication: No difficulties  Cognition Arousal/Alertness: Awake/alert Behavior During Therapy: WFL for tasks assessed/performed Overall Cognitive Status: Within Functional Limits for tasks assessed                                        General Comments General comments (skin integrity, edema, etc.): HR 110 at rest, 120 with amb    Exercises     Assessment/Plan    PT Assessment Patent does not need any further PT services  PT Problem List         PT Treatment Interventions      PT Goals (Current goals can be found in the Care Plan section)  Acute Rehab PT Goals PT Goal Formulation: All assessment and education complete, DC therapy    Frequency     Barriers to discharge        Co-evaluation               AM-PAC PT "6 Clicks" Mobility  Outcome Measure Help needed turning from your back to your side while in  a flat bed without using bedrails?: None Help needed moving from lying on your back to sitting on the side of a flat bed without using bedrails?: None Help needed moving to and from a bed to a chair (including a wheelchair)?: None Help needed standing up from a chair using your arms (e.g., wheelchair or bedside chair)?: None Help needed to walk in hospital room?: None Help needed climbing 3-5 steps with a railing? : None 6 Click Score: 24    End of Session   Activity Tolerance: Patient tolerated treatment well Patient left: in bed;with call bell/phone within reach Nurse Communication: Mobility status PT Visit Diagnosis: Other abnormalities of gait and mobility (R26.89)    Time: 1445-1457 PT Time Calculation (min) (ACUTE ONLY): 12 min   Charges:   PT Evaluation $PT Eval Low Complexity: 1 Low          [facility] PT Acute Rehabilitation Services Pager [phone] Office  [phone]   Angelina Ok [facility] 07/27/2019, 3:54 PM

## 2019-07-28 ENCOUNTER — Other Ambulatory Visit: Payer: Self-pay

## 2019-07-28 DIAGNOSIS — I4891 Unspecified atrial fibrillation: Secondary | ICD-10-CM | POA: Diagnosis not present

## 2019-07-28 LAB — COMPREHENSIVE METABOLIC PANEL
ALT: 51 U/L — ABNORMAL HIGH (ref 0–44)
AST: 24 U/L (ref 15–41)
Albumin: 2.6 g/dL — ABNORMAL LOW (ref 3.5–5.0)
Alkaline Phosphatase: 83 U/L (ref 38–126)
Anion gap: 9 (ref 5–15)
BUN: 13 mg/dL (ref 8–23)
CO2: 23 mmol/L (ref 22–32)
Calcium: 8.2 mg/dL — ABNORMAL LOW (ref 8.9–10.3)
Chloride: 104 mmol/L (ref 98–111)
Creatinine, Ser: 0.69 mg/dL (ref 0.44–1.00)
GFR calc Af Amer: >60 mL/min (ref >60)
GFR calc non Af Amer: >60 mL/min (ref >60)
Glucose, Bld: 205 mg/dL — ABNORMAL HIGH (ref 70–99)
Potassium: 3.7 mmol/L (ref 3.5–5.1)
Sodium: 136 mmol/L (ref 135–145)
Total Bilirubin: 0.6 mg/dL (ref 0.3–1.2)
Total Protein: 6.1 g/dL — ABNORMAL LOW (ref 6.5–8.1)

## 2019-07-28 LAB — GLUCOSE, CAPILLARY
Glucose-Capillary: 229 mg/dL — ABNORMAL HIGH (ref 70–99)
Glucose-Capillary: 242 mg/dL — ABNORMAL HIGH (ref 70–99)
Glucose-Capillary: 190 mg/dL — ABNORMAL HIGH (ref 70–99)
Glucose-Capillary: 310 mg/dL — ABNORMAL HIGH (ref 70–99)
Glucose-Capillary: 274 mg/dL — ABNORMAL HIGH (ref 70–99)

## 2019-07-28 LAB — CBC
HCT: 38.3 % (ref 36.0–46.0)
Hemoglobin: 11.5 g/dL — ABNORMAL LOW (ref 12.0–15.0)
MCH: 24.8 pg — ABNORMAL LOW (ref 26.0–34.0)
MCHC: 30 g/dL (ref 30.0–36.0)
MCV: 82.7 fL (ref 80.0–100.0)
Platelets: 194 10*3/uL (ref 150–400)
RBC: 4.63 MIL/uL (ref 3.87–5.11)
RDW: 14.2 % (ref 11.5–15.5)
WBC: 7.8 10*3/uL (ref 4.0–10.5)
nRBC: 0 % (ref 0.0–0.2)

## 2019-07-28 LAB — LIPID PANEL
Cholesterol: 156 mg/dL (ref 0–200)
HDL: 32 mg/dL — ABNORMAL LOW (ref >40)
LDL Cholesterol: 101 mg/dL — ABNORMAL HIGH (ref 0–99)
Total CHOL/HDL Ratio: 4.9 RATIO
Triglycerides: 116 mg/dL (ref <150)
VLDL: 23 mg/dL (ref 0–40)

## 2019-07-28 LAB — HEPARIN LEVEL (UNFRACTIONATED): Heparin Unfractionated: 0.52 IU/mL (ref 0.30–0.70)

## 2019-07-28 MED ORDER — METOPROLOL TARTRATE 50 MG PO TABS: 75.0000 mg | ORAL_TABLET | Freq: Two times a day (BID) | ORAL | Status: DC

## 2019-07-28 MED ORDER — INSULIN GLARGINE 100 UNIT/ML ~~LOC~~ SOLN
25.0000 [IU] | Freq: Every day | SUBCUTANEOUS | Status: DC
  Administered 2019-07-28 – 2019-07-31 (×4): 25 [IU] via SUBCUTANEOUS
  Filled 2019-07-28 (×6): qty 0.25

## 2019-07-28 MED ORDER — APIXABAN 5 MG PO TABS: 5.0000 mg | ORAL_TABLET | Freq: Two times a day (BID) | ORAL | Status: DC

## 2019-07-28 MED ORDER — METOPROLOL TARTRATE 50 MG PO TABS
50.0000 mg | ORAL_TABLET | Freq: Two times a day (BID) | ORAL | Status: DC
  Administered 2019-07-28 – 2019-07-31 (×7): 50 mg via ORAL
  Filled 2019-07-28 (×7): qty 1

## 2019-07-28 MED ORDER — DILTIAZEM HCL 60 MG PO TABS
120.0000 mg | ORAL_TABLET | Freq: Four times a day (QID) | ORAL | Status: DC
  Administered 2019-07-28 (×3): 120 mg via ORAL
  Filled 2019-07-28 (×3): qty 2

## 2019-07-28 MED ORDER — APIXABAN 5 MG PO TABS
5.0000 mg | ORAL_TABLET | Freq: Two times a day (BID) | ORAL | Status: DC
  Administered 2019-07-28 – 2019-08-01 (×9): 5 mg via ORAL
  Filled 2019-07-28 (×9): qty 1

## 2019-07-28 MED ORDER — POTASSIUM CHLORIDE CRYS ER 20 MEQ PO TBCR
40.0000 meq | EXTENDED_RELEASE_TABLET | Freq: Once | ORAL | Status: DC
  Filled 2019-07-28: qty 2

## 2019-07-28 MED ORDER — ATORVASTATIN CALCIUM 10 MG PO TABS
20.0000 mg | ORAL_TABLET | Freq: Every day | ORAL | Status: DC
  Administered 2019-07-28: 20 mg via ORAL
  Filled 2019-07-28: qty 2

## 2019-07-28 MED ORDER — POTASSIUM CHLORIDE CRYS ER 10 MEQ PO TBCR
40.0000 meq | EXTENDED_RELEASE_TABLET | Freq: Once | ORAL | Status: AC
  Administered 2019-07-28: 40 meq via ORAL
  Filled 2019-07-28: qty 4

## 2019-07-28 MED ORDER — ATORVASTATIN CALCIUM 10 MG PO TABS: 10.0000 mg | ORAL_TABLET | Freq: Every day | ORAL | Status: DC

## 2019-07-28 NOTE — Progress Notes (Addendum)
Central monitoring called to inform RN that QTC was 614.  EKG completed to confirm.  EKG shows QTc 485.  Consistent with previous EKG's.  Paged FPTS and made aware.  No new orders.

## 2019-07-28 NOTE — Progress Notes (Addendum)
Family Medicine Teaching Service Daily Progress Note Intern Pager: [phone]  Patient name: [name] [name] Medical record number: [id number] Date of birth: July 19, 1950 Age: 69 y.o. Gender: female  Primary Care Provider: Dana Allan, MD Consultants: Cardiology Code Status: Full  Pt Overview and Major Events to Date:  Admitted 7/15  Assessment and Plan: [name] [name] is a 69 year old female presenting with palpitations and weakness, found to have new onset A. fib with RVR.  PMH significant for HTN, DM, history of pericardial effusion, mixed hyperlipidemia.  New onset atrial fibrillationwith BUL:AGTXM, improving. Currently on IV diltiazem drip at max dose.  Last 24 hours metoprolol has been at 50 mg twice daily.  Overnight, pulse 94-118, BP systolic 97-132, diastolic 76-87.  SPO2 between 96 and 99%. -PT: No follow-up recommended -OT: Awaiting OT rec -Follow-up vitals -To follow-up outpatient with cardiology -Recommend outpatient sleep study to rule out OSA -Convert Cardizem IV to p.o. Cardizem: We will give Cardizem p.o. 125 mg first, let the IV dilt drip run for an hour, then d/c the IV. PO diltiazem will be 125mg  q6. This plan was confirmed by pharmacy, and verbally relayed to nursing. -Convert heparin GGT to p.o. Eliquis: We will give the Eliquis 5mg  and stop the heparin drip at the same time. This plan was confirmed by pharmacy, and the pharmacist will call the nurse to verbally relay.   Hypertension:Chronic, poorly controlled. Overnight, pulse 94-118, BP systolic 97-132, diastolic 76-87.  SPO2 between 96 and 99%.  Lopressor and diltiazem as above. -Continue to monitor BP  Type 2 Diabetes:Chronic, poorly controlled. Admission A1c >15.  FS glucose this morning 229. -Continue regular sliding scale -Continue Lantus 20 units -Continue to follow FS glucose -We will send home with  insulin  Hyperlipidemia:Chronic, stable. History of mixed hyperlipidemia.  Prescribed atorvastatin at home, not taking. -Lipid panel this morning: LDL 101, HDL 32 -Start atorvastatin 10 mg daily   H/o SmallPericardial effusion History of pericardial effusion in 2013.  Lost to follow-up.  Echo showed trivial circumferential pericardial effusion, LVEF 40 to 45%, LV global hypokinesis, moderately elevated pulmonary artery systolic pressure, moderate tricuspid regurgitation. -Appreciate cardiology recommendations -Will need outpatient cardiology follow-up   FEN/GI: Modified carb diet PPx: Heparin GGT  Disposition: Cardiac telemetry  Subjective:  [name] [name] was found laying on her left side in bed this morning, awake, alert, and conversational.  She reports sleeping better than the night before, waking up intermittently.  She still endorses a pressure-like headache on the left side of her face, but otherwise has no complaints.  Feels as though her heart is pounding a little less against her chest, but she can still feel her heartbeat in her ear when she sleeps.  Objective: Temp:  [97.3 F (36.3 C)-98.5 F (36.9 C)] 98.5 F (36.9 C) (07/17 0729) Pulse Rate:  [69-122] 115 (07/17 1058) Resp:  [14-23] 23 (07/17 1058) BP: (97-137)/(76-94) 125/91 (07/17 1058) SpO2:  [95 %-99 %] 96 % (07/17 1058) Weight:  [81.6 kg] 81.6 kg (07/17 0356)  Physical Exam: General: Awake, alert, oriented Cardiovascular: Irregularly irregular rhythm, tachycardic, no murmurs auscultated Respiratory: Clear to auscultation bilaterally Extremities: No BLE edema  Laboratory: Recent Labs  Lab 07/26/19 1318 07/27/19 0449 07/28/19 0349  WBC 7.7 8.0 7.8  HGB 12.9 12.8 11.5*  HCT 42.3 41.9 38.3  PLT 220 191 194   Recent Labs  Lab 07/26/19 1318 07/27/19 0449 07/28/19 0349  NA 138 137 136  K 4.5 3.7 3.7  CL 100 101 104   CO2  26 24 23   BUN 9 8 13   CREATININE 0.93 0.71 0.69  CALCIUM 8.8* 8.2* 8.2*  PROT  --  6.6 6.1*  BILITOT  --  0.4 0.6  ALKPHOS  --  94 83  ALT  --  66* 51*  AST  --  28 24  GLUCOSE 381* 330* 205*    Imaging/Diagnostic Tests: ECHOCARDIOGRAM COMPLETE  Result Date: 07/27/2019    ECHOCARDIOGRAM REPORT   Patient Name:   [name] [name] Date of Exam: 07/27/2019 Medical Rec #:  [name]         Height:       63.0 in Accession #:    07/29/2019        Weight:       175.9 lb Date of Birth:  [birth year]        BSA:          1.831 m Patient Age:    68 years          BP:           132/85 mmHg Patient Gender: F                 HR:           93 bpm. Exam Location:  Inpatient Procedure: 2D Echo, Cardiac Doppler and Color Doppler Indications:    R94.31 Abnormal EKG; I48.91* Unspeicified atrial fibrillation  History:        Patient has prior history of Echocardiogram examinations, most                 recent 02/23/2011. Abnormal ECG, Arrythmias:Atrial Fibrillation;                 Risk Factors:Hypertension, Diabetes and Dyslipidemia.                 Pericardial effusion.  Sonographer:    12/03/1950 RDCS Referring Phys: [phone] BRIAN MILLER IMPRESSIONS  1. Trivial pericardial effusion is present. The pericardial effusion is circumferential.  2. Left ventricular ejection fraction, by estimation, is 40 to 45%. The left ventricle has mildly decreased function. The left ventricle demonstrates global hypokinesis. There is moderate left ventricular hypertrophy. Left ventricular diastolic parameters are indeterminate.  3. Right ventricular systolic function is normal. The right ventricular size is normal. There is moderately elevated pulmonary artery systolic pressure. The estimated right ventricular systolic pressure is 59.1 mmHg.  4. The mitral valve is normal in structure. Trivial mitral valve regurgitation. No evidence of mitral stenosis.  5. Tricuspid valve regurgitation is moderate.  6. The aortic valve is tricuspid.  Aortic valve regurgitation is not visualized. Mild aortic valve sclerosis is present, with no evidence of aortic valve stenosis.  7. The inferior vena cava is normal in size with greater than 50% respiratory variability, suggesting right atrial pressure of 3 mmHg. FINDINGS  Left Ventricle: Left ventricular ejection fraction, by estimation, is 40 to 45%. The left ventricle has mildly decreased function. The left ventricle demonstrates global hypokinesis. The left ventricular internal cavity size was normal in size. There is  moderate left ventricular hypertrophy. Left ventricular diastolic parameters are indeterminate. Right Ventricle: The right ventricular size is normal. No increase in right ventricular wall thickness. Right ventricular systolic function is normal. There is moderately elevated pulmonary artery systolic pressure. The tricuspid regurgitant velocity is 3.32 m/s, and with an assumed right atrial pressure of 15 mmHg, the estimated right ventricular systolic pressure is 59.1 mmHg. Left Atrium: Left atrial size was normal in size.  Right Atrium: Right atrial size was normal in size. Pericardium: Trivial pericardial effusion is present. The pericardial effusion is circumferential. There is no evidence of cardiac tamponade. Mitral Valve: The mitral valve is normal in structure. Normal mobility of the mitral valve leaflets. Trivial mitral valve regurgitation. No evidence of mitral valve stenosis. Tricuspid Valve: The tricuspid valve is normal in structure. Tricuspid valve regurgitation is moderate . No evidence of tricuspid stenosis. Aortic Valve: The aortic valve is tricuspid. Aortic valve regurgitation is not visualized. Mild aortic valve sclerosis is present, with no evidence of aortic valve stenosis. Aortic valve peak gradient measures 25.0 mmHg. Pulmonic Valve: The pulmonic valve was normal in structure. Pulmonic valve regurgitation is not visualized. No evidence of pulmonic stenosis. Aorta: The aortic  root is normal in size and structure. Venous: The inferior vena cava is normal in size with greater than 50% respiratory variability, suggesting right atrial pressure of 3 mmHg. IAS/Shunts: No atrial level shunt detected by color flow Doppler. Additional Comments: Trivial pericardial effusion is present. The pericardial effusion is circumferential.  LEFT VENTRICLE PLAX 2D LVIDd:         3.30 cm LVIDs:         2.60 cm LV PW:         1.50 cm LV IVS:        1.60 cm LVOT diam:     1.90 cm LV SV:         27 LV SV Index:   15 LVOT Area:     2.84 cm  LV Volumes (MOD) LV vol d, MOD A2C: 39.1 ml LV vol d, MOD A4C: 48.0 ml LV vol s, MOD A2C: 29.9 ml LV vol s, MOD A4C: 27.4 ml LV SV MOD A2C:     9.2 ml LV SV MOD A4C:     48.0 ml LV SV MOD BP:      14.1 ml RIGHT VENTRICLE            IVC RV S prime:     5.59 cm/s  IVC diam: 1.80 cm TAPSE (M-mode): 0.7 cm LEFT ATRIUM             Index       RIGHT ATRIUM           Index LA diam:        4.30 cm 2.35 cm/m  RA Area:     10.10 cm LA Vol (A2C):   26.6 ml 14.53 ml/m RA Volume:   19.70 ml  10.76 ml/m LA Vol (A4C):   41.6 ml 22.72 ml/m LA Biplane Vol: 35.7 ml 19.50 ml/m  AORTIC VALVE AV Area (Vmax): 0.76 cm AV Vmax:        250.00 cm/s AV Peak Grad:   25.0 mmHg LVOT Vmax:      67.20 cm/s LVOT Vmean:     51.600 cm/s LVOT VTI:       0.094 m  AORTA Ao Root diam: 3.60 cm Ao Asc diam:  3.30 cm MITRAL VALVE                TRICUSPID VALVE MV Area (PHT): 4.07 cm     TR Peak grad:   44.1 mmHg MV Decel Time: 187 msec     TR Vmax:        332.00 cm/s MV E velocity: 122.00 cm/s  SHUNTS                             Systemic VTI:  0.09 m                             Systemic Diam: 1.90 cm Donato Schultz MD Electronically signed by Donato Schultz MD Signature Date/Time: 07/27/2019/2:04:58 PM    Final      Fayette Pho, MD 07/28/2019, 1:31 PM PGY-1, [facility] Family Medicine FPTS Intern pager: [phone], text pages welcome

## 2019-07-28 NOTE — Plan of Care (Signed)

## 2019-07-29 ENCOUNTER — Encounter: Payer: Self-pay | Admitting: Family Medicine

## 2019-07-29 DIAGNOSIS — I4891 Unspecified atrial fibrillation: Secondary | ICD-10-CM | POA: Diagnosis not present

## 2019-07-29 DIAGNOSIS — I471 Supraventricular tachycardia: Secondary | ICD-10-CM | POA: Insufficient documentation

## 2019-07-29 DIAGNOSIS — I4719 Other supraventricular tachycardia: Secondary | ICD-10-CM

## 2019-07-29 DIAGNOSIS — Z8719 Personal history of other diseases of the digestive system: Secondary | ICD-10-CM

## 2019-07-29 HISTORY — DX: Supraventricular tachycardia: I47.1

## 2019-07-29 HISTORY — DX: Other supraventricular tachycardia: I47.19

## 2019-07-29 HISTORY — DX: Personal history of other diseases of the digestive system: Z87.19

## 2019-07-29 LAB — BASIC METABOLIC PANEL
Anion gap: 8 (ref 5–15)
BUN: 11 mg/dL (ref 8–23)
CO2: 20 mmol/L — ABNORMAL LOW (ref 22–32)
Calcium: 8.5 mg/dL — ABNORMAL LOW (ref 8.9–10.3)
Chloride: 108 mmol/L (ref 98–111)
Creatinine, Ser: 0.63 mg/dL (ref 0.44–1.00)
GFR calc Af Amer: >60 mL/min (ref >60)
GFR calc non Af Amer: >60 mL/min (ref >60)
Glucose, Bld: 141 mg/dL — ABNORMAL HIGH (ref 70–99)
Potassium: 4.1 mmol/L (ref 3.5–5.1)
Sodium: 136 mmol/L (ref 135–145)

## 2019-07-29 LAB — CBC
HCT: 37.9 % (ref 36.0–46.0)
Hemoglobin: 11.5 g/dL — ABNORMAL LOW (ref 12.0–15.0)
MCH: 25.2 pg — ABNORMAL LOW (ref 26.0–34.0)
MCHC: 30.3 g/dL (ref 30.0–36.0)
MCV: 83.1 fL (ref 80.0–100.0)
Platelets: 208 10*3/uL (ref 150–400)
RBC: 4.56 MIL/uL (ref 3.87–5.11)
RDW: 14.4 % (ref 11.5–15.5)
WBC: 7.3 10*3/uL (ref 4.0–10.5)
nRBC: 0 % (ref 0.0–0.2)

## 2019-07-29 LAB — GLUCOSE, CAPILLARY
Glucose-Capillary: 190 mg/dL — ABNORMAL HIGH (ref 70–99)
Glucose-Capillary: 183 mg/dL — ABNORMAL HIGH (ref 70–99)
Glucose-Capillary: 198 mg/dL — ABNORMAL HIGH (ref 70–99)
Glucose-Capillary: 264 mg/dL — ABNORMAL HIGH (ref 70–99)

## 2019-07-29 LAB — MAGNESIUM: Magnesium: 1.5 mg/dL — ABNORMAL LOW (ref 1.7–2.4)

## 2019-07-29 MED ORDER — DILTIAZEM HCL 60 MG PO TABS
90.0000 mg | ORAL_TABLET | Freq: Four times a day (QID) | ORAL | Status: AC
  Administered 2019-07-29 (×3): 90 mg via ORAL
  Filled 2019-07-29 (×3): qty 2

## 2019-07-29 MED ORDER — MAGNESIUM OXIDE 400 (241.3 MG) MG PO TABS
400.0000 mg | ORAL_TABLET | Freq: Once | ORAL | Status: AC
  Administered 2019-07-29: 400 mg via ORAL
  Filled 2019-07-29: qty 1

## 2019-07-29 MED ORDER — ATORVASTATIN CALCIUM 40 MG PO TABS
40.0000 mg | ORAL_TABLET | Freq: Every day | ORAL | Status: DC
  Administered 2019-07-29 – 2019-08-01 (×4): 40 mg via ORAL
  Filled 2019-07-29 (×4): qty 1

## 2019-07-29 MED ORDER — DILTIAZEM HCL ER COATED BEADS 180 MG PO CP24
360.0000 mg | ORAL_CAPSULE | Freq: Every day | ORAL | Status: DC
  Administered 2019-07-30 – 2019-07-31 (×2): 360 mg via ORAL
  Filled 2019-07-29 (×2): qty 2

## 2019-07-29 NOTE — Progress Notes (Signed)
Family Medicine Teaching Service Daily Progress Note Intern Pager: [phone]  Patient name: [name] [name] Medical record number: [id number] Date of birth: [birth year] Age: 69 y.o. Gender: female  Primary Care Provider: Dana Allan, MD Consultants: Cardiology Code Status: Full  Pt Overview and Major Events to Date:  Admitted 7/15  Assessment and Plan: [name] is a 69 year old female presenting with palpitations and weakness, found to have new onset A. fib with RVR.  PMH significant for HTN, DM, history of pericardial effusion, mixed hyperlipidemia.  New onset atrial fibrillationwith ELF:YBOFB, improving. HR 50-118. Continued on PO diltazem 120mg  q6h and metoprolol 50mg  BID. Dilt held this morning due to low HR. Will decrease to 90mg  for 12PM dose and if patient tolerated well will transition to daily dose for possible discharge this evening. Heparin gtt dc'd yesterday and eliquis 5mg  BID started. -Start diltiazem 90mg  q6h for now; transition to daily dose per pharmacy  -Continue metop 50mg  BID  -Follow-up vitals -To follow-up outpatient with cardiology -Recommend outpatient sleep study to rule out OSA  Hypertension:Chronic, poorly controlled. SBP 93-137. DBP 52-94.  Lopressor and diltiazem as above. -Continue to monitor BP  Type 2 Diabetes:Chronic, poorly controlled. Admission A1c >15.  FS glucose this morning 190. -Continue regular sliding scale -Continue Lantus 25 units -Continue to follow FS glucose -We will send home with insulin  Hyperlipidemia:Chronic, stable. History of mixed hyperlipidemia.  Prescribed atorvastatin at home, not taking. -Lipid panel this morning: LDL 101, HDL 32. ASCVD 22.3% -Start atorvastatin 40 mg daily  H/o SmallPericardial effusion History of pericardial effusion in 2013.  Lost to follow-up.  Echo showed trivial circumferential pericardial effusion,  LVEF 40 to 45%, LV global hypokinesis, moderately elevated pulmonary artery systolic pressure, moderate tricuspid regurgitation. -Will need outpatient cardiology follow-up  FEN/GI: Modified carb diet PPx: Eliquis 5 mg BID  Disposition: Cardiac telemetry  Subjective:  She is doing well. Concern for low HR this morning. Understand some of her medication needs to be adjusted. Would like to go home today.  Objective: Temp:  [97.4 F (36.3 C)-98.1 F (36.7 C)] 97.4 F (36.3 C) (07/18 0739) Pulse Rate:  [50-122] 110 (07/18 0739) Resp:  [14-23] 20 (07/18 0739) BP: (93-138)/(52-94) 138/73 (07/18 0739) SpO2:  [94 %-97 %] 95 % (07/18 0739) Weight:  [82.2 kg] 82.2 kg (07/18 0401)  Physical Exam:  General: Appears well, no acute distress. Age appropriate. Sitting up on side of bed. Cardiac: Mildly tachycardic, normal heart sounds, no murmurs Respiratory: CTAB, normal effort Extremities: No LE edema. WWP.  Neuro: alert and oriented, no focal deficits Psych: normal affect, jovial  Laboratory: Recent Labs  Lab 07/27/19 0449 07/28/19 0349 07/29/19 0227  WBC 8.0 7.8 7.3  HGB 12.8 11.5* 11.5*  HCT 41.9 38.3 37.9  PLT 191 194 208   Recent Labs  Lab 07/27/19 0449 07/28/19 0349 07/29/19 0227  NA 137 136 136  K 3.7 3.7 4.1  CL 101 104 108  CO2 24 23 20*  BUN 8 13 11   CREATININE 0.71 0.69 0.63  CALCIUM 8.2* 8.2* 8.5*  PROT 6.6 6.1*  --   BILITOT 0.4 0.6  --   ALKPHOS 94 83  --   ALT 66* 51*  --   AST 28 24  --   GLUCOSE 330* 205* 141*    Imaging/Diagnostic Tests: No new imaging.   07/29/19, DO 07/29/2019, 7:59 AM PGY-2, [place] [facility] FPTS Intern pager: [phone], text pages welcome

## 2019-07-29 NOTE — Assessment & Plan Note (Signed)
Patient in no acute distress.  Denies any previous history of heart palpitations.  Does report now that feeling heartbeat in her back. -EKG shows A. fib RVR with ventricular rate 164 bpm. -Given the patient unable to take p.o. medications well advised to go to emergency room for rate control and possible anticoagulation therapy. -Follow-up 1 week.

## 2019-07-29 NOTE — Care Management (Signed)
Eliquis covered through Medicaid benefit.

## 2019-07-29 NOTE — Plan of Care (Signed)

## 2019-07-29 NOTE — Assessment & Plan Note (Signed)
Hemoglobin A1c greater than 15.  CBG 377.  Patient reports she has not eaten today.  Patient has stopped taking Metformin and all the medications except for Ozempic. -Discussed risks of uncontrolled diabetes. -Given that patient will be going to the emergency department for atrial tachycardia we will revisit diabetes management at next visit. -Follow-up in 1 week

## 2019-07-30 DIAGNOSIS — I10 Essential (primary) hypertension: Secondary | ICD-10-CM

## 2019-07-30 DIAGNOSIS — I4891 Unspecified atrial fibrillation: Secondary | ICD-10-CM | POA: Diagnosis not present

## 2019-07-30 LAB — CBC
HCT: 37.3 % (ref 36.0–46.0)
Hemoglobin: 11.4 g/dL — ABNORMAL LOW (ref 12.0–15.0)
MCH: 25.6 pg — ABNORMAL LOW (ref 26.0–34.0)
MCHC: 30.6 g/dL (ref 30.0–36.0)
MCV: 83.6 fL (ref 80.0–100.0)
Platelets: 210 10*3/uL (ref 150–400)
RBC: 4.46 MIL/uL (ref 3.87–5.11)
RDW: 14.5 % (ref 11.5–15.5)
WBC: 6.3 10*3/uL (ref 4.0–10.5)
nRBC: 0 % (ref 0.0–0.2)

## 2019-07-30 LAB — GLUCOSE, CAPILLARY
Glucose-Capillary: 113 mg/dL — ABNORMAL HIGH (ref 70–99)
Glucose-Capillary: 157 mg/dL — ABNORMAL HIGH (ref 70–99)
Glucose-Capillary: 212 mg/dL — ABNORMAL HIGH (ref 70–99)
Glucose-Capillary: 258 mg/dL — ABNORMAL HIGH (ref 70–99)

## 2019-07-30 LAB — BASIC METABOLIC PANEL
Anion gap: 6 (ref 5–15)
BUN: 11 mg/dL (ref 8–23)
CO2: 25 mmol/L (ref 22–32)
Calcium: 8.5 mg/dL — ABNORMAL LOW (ref 8.9–10.3)
Chloride: 107 mmol/L (ref 98–111)
Creatinine, Ser: 0.77 mg/dL (ref 0.44–1.00)
GFR calc Af Amer: >60 mL/min (ref >60)
GFR calc non Af Amer: >60 mL/min (ref >60)
Glucose, Bld: 152 mg/dL — ABNORMAL HIGH (ref 70–99)
Potassium: 4 mmol/L (ref 3.5–5.1)
Sodium: 138 mmol/L (ref 135–145)

## 2019-07-30 MED ORDER — INSULIN STARTER KIT- PEN NEEDLES (ENGLISH)
1.0000 | Freq: Once | Status: AC
  Administered 2019-07-30: 1
  Filled 2019-07-30: qty 1

## 2019-07-30 NOTE — Plan of Care (Signed)

## 2019-07-30 NOTE — TOC Progression Note (Signed)
Transition of Care [facility]) - Progression Note    Patient Details  Name: [name] [name] MRN: [id number] Date of Birth: 12-12-1950  Transition of Care [facility]) CM/SW Contact  Leone Haven, RN Phone Number: 07/30/2019, 11:17 AM  Clinical Narrative:    NCM spoke with patient , informed her of the co pay amount for her eliquis.  NCM informed MD to send medications to the [facility] pharmacy to be filled.  They will bring the meds to patient room prior to dc.  She has no other needs.         Expected Discharge Plan and Services                                                 Social Determinants of Health (SDOH) Interventions    Readmission Risk Interventions No flowsheet data found.

## 2019-07-30 NOTE — TOC Benefit Eligibility Note (Signed)
Transition of Care [facility]) Benefit Eligibility Note    Patient Details  Name: [name] [name] MRN: [id number] Date of Birth: 28-Mar-1950   Medication/Dose: Everlene Balls  5 MG BID  Covered?: Yes  Tier: 3 Drug  Prescription Coverage Preferred Pharmacy: [facility] [address] with Person/Company/Phone Number:: RICO  @ [employer]  # [phone]  Co-Pay: Alvester Morin  Prior Approval: No  Deductible:  (LOWE INCOME SUBSIDY LEVEL TWO)  Additional Notes: MEDICAID [place]  EFF-DATE: 05-11-2017   CO-PAY- $4.00    Mardene Sayer Phone Number: 07/30/2019, 10:15 AM

## 2019-07-30 NOTE — Discharge Instructions (Addendum)
Dear [name] [name],  Thank you for letting us participate in your care. I am glad your heart rate has improved.   POST-HOSPITAL & CARE INSTRUCTIONS 1. We started you on several new medications: Entresto 24-26 mg twice daily. Metoprolol 100 mg daily. Atorvastatin 40 mg daily. Eliquis 5 mg twice daily. Lantus 25 units nightly.  2. Please do not take Benicar if you have any at home.  3. You will need to follow up with cardiology outpatient. 4. Go to your follow up appointments (listed below)   DOCTOR'S APPOINTMENT   Future Appointments  Date Time Provider Department Center  08/03/2019  8:30 AM ACCESS TO CARE POOL [facility]  08/17/2019  8:45 AM Beatrice Lecher, PA-C [facility]    Follow-up Information    Tereso Newcomer T, PA-C Follow up on 08/17/2019.   Specialties: Cardiology, Physician Assistant Why: Please arrive 15 minutes early for your post-hospital cardiology follow-up appointment Contact information: [address] [place] [address] [phone]        Nahser, Deloris Ping, MD .   Specialty: Cardiology Contact information: [address] [place] [address] [phone]        [facility]. Go on 08/03/2019.   Why: Hospital follow up appt @8 :30am Contact information: [address] [place] [address] [phone]              Take care and be well!  Family Medicine Teaching Service Inpatient Team [place]  [facility]  [address] [place], [place] [address] [phone]   Heart Failure Education: 1. Weigh yourself EVERY morning after you go to the bathroom but before you eat or drink anything. Write this number down in a weight log/diary. If you gain 3 pounds overnight or 5 pounds in a week, call the office. 2. Take your medicines as prescribed. If you have concerns about your medications, please call us before you stop taking them.  3. Eat low  salt foods--Limit salt (sodium) to 2000 mg per day. This will help prevent your body from holding onto fluid. Read food labels as many processed foods have a lot of sodium, especially canned goods and prepackaged meats. If you would like some assistance choosing low sodium foods, we would be happy to set you up with a nutritionist. 4. Stay as active as you can everyday. Staying active will give you more energy and make your muscles stronger. Start with 5 minutes at a time and work your way up to 30 minutes a day. Break up your activities--do some in the morning and some in the afternoon. Start with 3 days per week and work your way up to 5 days as you can.  If you have chest pain, feel short of breath, dizzy, or lightheaded, STOP. If you don't feel better after a short rest, call 911. If you do feel better, call the office to let us know you have symptoms with exercise. 5. Limit all fluids for the day to less than 2 liters. Fluid includes all drinks, coffee, juice, ice chips, soup, jello, and all other liquids.   Information on my medicine - ELIQUIS (apixaban)  This medication education was reviewed with me or my healthcare representative as part of my discharge preparation.     Why was Eliquis prescribed for you? Eliquis was prescribed for you to reduce the risk of a blood clot forming that can cause a stroke if you  have a medical condition called atrial fibrillation (a type of irregular heartbeat).  What do You need to know about Eliquis ? Take your Eliquis TWICE DAILY - one tablet in the morning and one tablet in the evening with or without food. If you have difficulty swallowing the tablet whole please discuss with your pharmacist how to take the medication safely.  Take Eliquis exactly as prescribed by your doctor and DO NOT stop taking Eliquis without talking to the doctor who prescribed the medication.  Stopping may increase your risk of developing a stroke.  Refill your prescription  before you run out.  After discharge, you should have regular check-up appointments with your healthcare provider that is prescribing your Eliquis.  In the future your dose may need to be changed if your kidney function or weight changes by a significant amount or as you get older.  What do you do if you miss a dose? If you miss a dose, take it as soon as you remember on the same day and resume taking twice daily.  Do not take more than one dose of ELIQUIS at the same time to make up a missed dose.  Important Safety Information A possible side effect of Eliquis is bleeding. You should call your healthcare provider right away if you experience any of the following: ? Bleeding from an injury or your nose that does not stop. ? Unusual colored urine (red or dark brown) or unusual colored stools (red or black). ? Unusual bruising for unknown reasons. ? A serious fall or if you hit your head (even if there is no bleeding).  Some medicines may interact with Eliquis and might increase your risk of bleeding or clotting while on Eliquis. To help avoid this, consult your healthcare provider or pharmacist prior to using any new prescription or non-prescription medications, including herbals, vitamins, non-steroidal anti-inflammatory drugs (NSAIDs) and supplements.  This website has more information on Eliquis (apixaban): http://www.eliquis.com/eliquis/home  ============================================  Atrial Fibrillation    Atrial fibrillation is a type of heartbeat that is irregular or fast. If you have this condition, your heart beats without any order. This makes it hard for your heart to pump blood in a normal way. Atrial fibrillation may come and go, or it may become a long-lasting problem. If this condition is not treated, it can put you at higher risk for stroke, heart failure, and other heart problems.  What are the causes? This condition may be caused by diseases that damage the heart.  They include:  High blood pressure.  Heart failure.  Heart valve disease.  Heart surgery. Other causes include:  Diabetes.  Thyroid disease.  Being overweight.  Kidney disease. Sometimes the cause is not known.  What increases the risk? You are more likely to develop this condition if:  You are older.  You smoke.  You exercise often and very hard.  You have a family history of this condition.  You are a man.  You use drugs.  You drink a lot of alcohol.  You have lung conditions, such as emphysema, pneumonia, or COPD.  You have sleep apnea.   What are the signs or symptoms? Common symptoms of this condition include:  A feeling that your heart is beating very fast.  Chest pain or discomfort.  Feeling short of breath.  Suddenly feeling light-headed or weak.  Getting tired easily during activity.  Fainting.  Sweating. In some cases, there are no symptoms.  How is this treated? Treatment for this  condition depends on underlying conditions and how you feel when you have atrial fibrillation. They include: 1. Medicines to: ? Prevent blood clots. ? Treat heart rate or heart rhythm problems. 2. Using devices, such as a pacemaker, to correct heart rhythm problems. 3. Doing surgery to remove the part of the heart that sends bad signals. 4. Closing an area where clots can form in the heart (left atrial appendage). In some cases, your doctor will treat other underlying conditions.  Follow these instructions at home:  Medicines 1. Take over-the-counter and prescription medicines only as told by your doctor. 2. Do not take any new medicines without first talking to your doctor. 3. If you are taking blood thinners: ? Talk with your doctor before you take any medicines that have aspirin or NSAIDs, such as ibuprofen, in them. ? Take your medicine exactly as told by your doctor. Take it at the same time each day. ? Avoid activities that could hurt or bruise  you. Follow instructions about how to prevent falls. ? Wear a bracelet that says you are taking blood thinners. Or, carry a card that lists what medicines you take. Lifestyle          Do not use any products that have nicotine or tobacco in them. These include cigarettes, e-cigarettes, and chewing tobacco. If you need help quitting, ask your doctor.  Eat heart-healthy foods. Talk with your doctor about the right eating plan for you.  Exercise regularly as told by your doctor.  Do not drink alcohol.  Lose weight if you are overweight.  Do not use drugs, including cannabis.  General instructions  If you have a condition that causes breathing to stop for a short period of time (apnea), treat it as told by your doctor.  Keep a healthy weight. Do not use diet pills unless your doctor says they are safe for you. Diet pills may make heart problems worse.  Keep all follow-up visits as told by your doctor. This is important.  Contact a doctor if:  You notice a change in the speed, rhythm, or strength of your heartbeat.  You are taking a blood-thinning medicine and you get more bruising.  You get tired more easily when you move or exercise.  You have a sudden change in weight.  Get help right away if:    1. You have pain in your chest or your belly (abdomen). 2. You have trouble breathing. 3. You have side effects of blood thinners, such as blood in your vomit, poop (stool), or pee (urine), or bleeding that cannot stop. 4. You have any signs of a stroke. "BE FAST" is an easy way to remember the main warning signs: ? B - Balance. Signs are dizziness, sudden trouble walking, or loss of balance. ? E - Eyes. Signs are trouble seeing or a change in how you see. ? F - Face. Signs are sudden weakness or loss of feeling in the face, or the face or eyelid drooping on one side. ? A - Arms. Signs are weakness or loss of feeling in an arm. This happens suddenly and usually on one side of  the body. ? S - Speech. Signs are sudden trouble speaking, slurred speech, or trouble understanding what people say. ? T - Time. Time to call emergency services. Write down what time symptoms started. 5. You have other signs of a stroke, such as: ? A sudden, very bad headache with no known cause. ? Feeling like you may vomit (nausea). ?  Vomiting. ? A seizure.  These symptoms may be an emergency. Do not wait to see if the symptoms will go away. Get medical help right away. Call your local emergency services (911 in the [place] Do not drive yourself to the hospital. Summary  Atrial fibrillation is a type of heartbeat that is irregular or fast.  You are at higher risk of this condition if you smoke, are older, have diabetes, or are overweight.  Follow your doctor's instructions about medicines, diet, exercise, and follow-up visits.  Get help right away if you have signs or symptoms of a stroke.  Get help right away if you cannot catch your breath, or you have chest pain or discomfort. This information is not intended to replace advice given to you by your health care provider. Make sure you discuss any questions you have with your health care provider. Document Revised: 06/21/2018 Document Reviewed: 06/21/2018 Elsevier Patient Education  2020 [employer].   ===========================================================

## 2019-07-30 NOTE — Progress Notes (Signed)
Patient at rest HR 110 before ambulation, ambulated 1107ft HR sustained at 120bpm. Once patient was back in her room sitting after HR now 90's-103. Patient tolerated ambulation well, no chest pain or dizziness.

## 2019-07-30 NOTE — Consult Note (Signed)
Cardiology Consultation:   Patient ID: [name] [name] MRN: [id number]; DOB: 1950-03-14  Admit date: 07/26/2019 Date of Consult: 07/30/2019  Primary Care Provider: Dana Allan, MD [facility] Cardiologist: Kristeen Miss, MD  [facility] Electrophysiologist:  None   Patient Profile:   [name] [name] is a 69 y.o. female with a hx of pericardial effusion without tamponade but requiring drainage, hypertension, hyperlipidemia, uncontrolled diabetes, and bipolar disorder who is being seen today for the evaluation of atrial fibrillation at the request of Dr. Larita Fife.  History of Present Illness:   [name] [name] was initially seen in the hospital 03/2010 with a hepatic abscess.  She developed a moderate pericardial effusion that required drainage.  She did not have tamponade.  She underwent a work-up that was negative for collagen vascular disease and there is no evidence of infection.  No malignancy was found at the time.  [name] [name] presented to the hospital 07/26/2019 with weakness and tachycardia.  She was seen at her PCPs office and referred to the ED.  Symptoms have been ongoing for approximately 1 week prior to presentation.  She was found to have new onset atrial fibrillation with rapid ventricular response.  Heart rates were initially in the 160s to 170s.  She was started on diltiazem and heparin infusions.  Heparin was transitioned to Eliquis.  Diltiazem was switched from IV to oral at 420 mg daily and metoprolol 50 mg twice daily.  Cardiology was consulted due to episodes of bradycardia with 1.7 seconds of pauses and inadequate rate control.  Echo this admission revealed LVEF 40 to 45% with mild diffuse hypokinesis and moderate LVH.  PASP was 59 mmHg.  Right atrial pressure was 3 mmHg.  Of note her hemoglobin A1c was greater than 15%.    [name] [name] reports that she is feeling better today.  For the first time she has been able to get some rest.  She notes that she sleeps with the head of  her bed elevated.  She has done this chronically for years.  She likes to walk daily for exercise.  She has not been able to do this since the Thursday prior to admission.  Even prior to that she noted that she was getting more dyspneic at shorter distances than in the past.  She has not had any lower extremity edema, orthopnea, or PND.  She denies any chest pain or pressure but did feel her heart racing when she was more tachycardic.  She notes that she has a trip to [place] next month and is adamant that she will be on that plane.  Past Medical History:  Diagnosis Date  . Abdominal pain   . Back pain   . Constipation   . Fatigue   . Full dentures   . Hemorrhoids   . Hypertension   . Lack of adequate sleep   . Liver mass   . Rash    on right breast  . Wears glasses     Past Surgical History:  Procedure Laterality Date  . ABDOMINAL HYSTERECTOMY    . BREAST DUCTAL SYSTEM EXCISION    . BREAST EXCISIONAL BIOPSY Left   . CHOLECYSTECTOMY, LAPAROSCOPIC  07/24/2010  . ESOPHAGEAL DILATION       Home Medications:  Prior to Admission medications   Medication Sig Start Date End Date Taking? Authorizing Provider  olmesartan (BENICAR) 5 MG tablet Take 1 tablet (5 mg total) by mouth daily. 09/06/17  Yes Winfrey, Harlen Labs, MD  Semaglutide (OZEMPIC, 0.25  OR 0.5 MG/DOSE, [place]) Inject 0.25 mg into the skin once a week.   Yes [provider]  glucose blood (ACCU-CHEK AVIVA) test strip Use as instructed Patient not taking: Reported on 07/26/2019 06/16/17   Moses Manners, MD  Lancets (ACCU-CHEK SOFT TOUCH) lancets Once daily testing. Patient not taking: Reported on 07/26/2019 06/16/17   Moses Manners, MD  metFORMIN (GLUCOPHAGE) 1000 MG tablet Take 1,000 mg by mouth 2 (two) times daily. 08/25/13   [provider]  OZEMPIC, 0.25 OR 0.5 MG/DOSE, 2 MG/1.5ML SOPN Inject 0.5 mg into the skin once a week. 06/02/18   Lennox Solders, MD    Inpatient Medications: Scheduled Meds: .  apixaban  5 mg Oral BID  . atorvastatin  40 mg Oral Daily  . diltiazem  360 mg Oral Daily  . insulin aspart  0-15 Units Subcutaneous TID WC  . insulin aspart  0-5 Units Subcutaneous QHS  . insulin glargine  25 Units Subcutaneous QHS  . melatonin  5 mg Oral QHS  . metoprolol tartrate  50 mg Oral BID  . polyethylene glycol  17 g Oral Daily  . senna  1 tablet Oral Daily  . sodium chloride flush  3 mL Intravenous Once   Continuous Infusions:  PRN Meds: acetaminophen (TYLENOL) oral liquid 160 mg/5 mL, ondansetron (ZOFRAN) IV  Allergies:    Allergies  Allergen Reactions  . Fruit & Vegetable Daily [Nutritional Supplements] Swelling and Other (See Comments)    All fruits cause tongue swelling and numbness  . Other Swelling    Tree nuts cause tongue swelling and numbness  . Peanut-Containing Drug Products Swelling and Other (See Comments)    Tongue swelling and numbness  . Aspirin Nausea And Vomiting and Other (See Comments)    Cannot take due to liver issues  . Tylenol [Acetaminophen] Other (See Comments)    Cannot take due to liver issues    Social History:   Social History   Socioeconomic History  . Marital status: Single    Spouse name: Not on file  . Number of children: 3  . Years of education: Not on file  . Highest education level: High school graduate  Occupational History  . Occupation: Retired    Associate Professor: OTHER    Comment: CNA  Tobacco Use  . Smoking status: Never Smoker  . Smokeless tobacco: Never Used  Vaping Use  . Vaping Use: Never used  Substance and Sexual Activity  . Alcohol use: No    Alcohol/week: 0.0 standard drinks  . Drug use: No  . Sexual activity: Not Currently  Other Topics Concern  . Not on file  Social History Narrative   Patient lives alone in an apartment complex for seniors.   Patient does not drive. She walks to surrounding places near her complex or her children run errands for her. Patient never goes without.   Patient enjoys  sewing, crafts, shopping, and walking.    Patient has 3 children and she is close with all of them.        Social Determinants of Health   Financial Resource Strain: Low Risk   . Difficulty of Paying Living Expenses: Not hard at all  Food Insecurity: No Food Insecurity  . Worried About Programme researcher, broadcasting/film/video in the Last Year: Never true  . Ran Out of Food in the Last Year: Never true  Transportation Needs: No Transportation Needs  . Lack of Transportation (Medical): No  . Lack of Transportation (Non-Medical):  No  Physical Activity: Sufficiently Active  . Days of Exercise per Week: 7 days  . Minutes of Exercise per Session: 60 min  Stress: No Stress Concern Present  . Feeling of Stress : Not at all  Social Connections: Moderately Isolated  . Frequency of Communication with Friends and Family: More than three times a week  . Frequency of Social Gatherings with Friends and Family: More than three times a week  . Attends Religious Services: More than 4 times per year  . Active Member of Clubs or Organizations: No  . Attends Banker Meetings: Never  . Marital Status: Never married  Intimate Partner Violence: Not At Risk  . Fear of Current or Ex-Partner: No  . Emotionally Abused: No  . Physically Abused: No  . Sexually Abused: No    Family History:    Family History  Problem Relation Age of Onset  . Stroke Mother   . Other Father        broken heart after spouse spouse  . Diabetes Sister   . Hypertension Sister   . Diabetes Sister   . Multiple sclerosis Sister   . Alcohol abuse Other   . Diabetes Other   . Breast cancer Neg Hx      ROS:  Please see the history of present illness.   All other ROS reviewed and negative.     Physical Exam/Data:   Vitals:   07/30/19 0628 07/30/19 0721 07/30/19 1110 07/30/19 1644  BP: 137/86 (!) 139/91 93/66 (!) 144/93  Pulse:  (!) 117 (!) 59 (!) 108  Resp:  (!) Temp:  97.8 F (36.6 C) 97.9 F (36.6 C) 97.8 F  (36.6 C)  TempSrc:  Oral Oral Oral  SpO2:  99% 95% 99%  Weight:      Height:        Intake/Output Summary (Last 24 hours) at 07/30/2019 1802 Last data filed at 07/29/2019 2305 Gross per 24 hour  Intake 240 ml  Output 450 ml  Net -210 ml   Last 3 Weights 07/30/2019 07/29/2019 07/28/2019  Weight (lbs) 180 lb 12.4 oz 181 lb 3.5 oz 179 lb 14.3 oz  Weight (kg) 82 kg 82.2 kg 81.6 kg     VS:  BP (!) 144/93 (BP Location: Right Arm)   Pulse (!) 108   Temp 97.8 F (36.6 C) (Oral)   Resp 19   Ht 5' 2.99" (1.6 m)   Wt 82 kg   SpO2 99%   BMI 32.03 kg/m  , BMI Body mass index is 32.03 kg/m. GENERAL:  Well appearing HEENT: Pupils equal round and reactive, fundi not visualized, oral mucosa unremarkable NECK:  No jugular venous distention, waveform within normal limits, carotid upstroke brisk and symmetric, no bruits LUNGS:  Clear to auscultation bilaterally HEART: Tachycardic.  Irregularly irregular.  PMI not displaced or sustained,S1 and S2 within normal limits, no S3, no S4, no clicks, no rubs, no murmurs ABD:  Flat, positive bowel sounds normal in frequency in pitch, no bruits, no rebound, no guarding, no midline pulsatile mass, no hepatomegaly, no splenomegaly EXT:  2 plus pulses throughout, no edema, no cyanosis no clubbing SKIN:  No rashes no nodules NEURO:  Cranial nerves II through XII grossly intact, motor grossly intact throughout PSYCH:  Cognitively intact, oriented to person place and time  EKG:  The EKG was personally reviewed and demonstrates:  07/28/2019: 2:1 atrial flutter versus atrial tachycardia.  Rate 117 bpm.  Telemetry:  Telemetry was personally reviewed and demonstrates: Atypical atrial flutter with variable ventricular response.  Pauses up to 1.7 seconds.  Overnight bradycardia.  Relevant CV Studies: Echo 07/27/19:  1. Trivial pericardial effusion is present. The pericardial effusion is  circumferential.  2. Left ventricular ejection fraction, by estimation, is 40  to 45%. The  left ventricle has mildly decreased function. The left ventricle  demonstrates global hypokinesis. There is moderate left ventricular  hypertrophy. Left ventricular diastolic  parameters are indeterminate.  3. Right ventricular systolic function is normal. The right ventricular  size is normal. There is moderately elevated pulmonary artery systolic  pressure. The estimated right ventricular systolic pressure is 59.1 mmHg.  4. The mitral valve is normal in structure. Trivial mitral valve  regurgitation. No evidence of mitral stenosis.  5. Tricuspid valve regurgitation is moderate.  6. The aortic valve is tricuspid. Aortic valve regurgitation is not  visualized. Mild aortic valve sclerosis is present, with no evidence of  aortic valve stenosis.  7. The inferior vena cava is normal in size with greater than 50%  respiratory variability, suggesting right atrial pressure of 3 mmHg.   Echo 02/23/11:  Study Conclusions   - Left ventricle: Wall thickness was increased in a pattern  of mild LVH. Systolic function was normal. The estimated  ejection fraction was in the range of 55% to 60%. Wall  motion was normal; there were no regional wall motion  abnormalities. Features are consistent with a pseudonormal  left ventricular filling pattern, with concomitant  abnormal relaxation and increased filling pressure (grade  2 diastolic dysfunction). Doppler parameters are  consistent with elevated mean left atrial filling  pressure.  - Mitral valve: Calcified annulus. Mildly thickened leaflets  .  - Left atrium: LA midly dilated by volumetric measurements  - Pericardium, extracardiac: A small pericardial effusion  was identified.    Laboratory Data:  High Sensitivity Troponin:   Recent Labs  Lab 07/26/19 1318 07/26/19 1612  TROPONINIHS 20* 18*     Chemistry Recent Labs  Lab 07/28/19 0349 07/29/19 0227 07/30/19 0401  NA 136 136 138  K 3.7 4.1  4.0  CL 104 108 107  CO2 23 20* 25  GLUCOSE 205* 141* 152*  BUN CREATININE 0.69 0.63 0.77  CALCIUM 8.2* 8.5* 8.5*  GFRNONAA >60 >60 >60  GFRAA >60 >60 >60  ANIONGAP Recent Labs  Lab 07/27/19 0449 07/28/19 0349  PROT 6.6 6.1*  ALBUMIN 2.7* 2.6*  AST 28 24  ALT 66* 51*  ALKPHOS 94 83  BILITOT 0.4 0.6   Hematology Recent Labs  Lab 07/28/19 0349 07/29/19 0227 07/30/19 0401  WBC 7.8 7.3 6.3  RBC 4.63 4.56 4.46  HGB 11.5* 11.5* 11.4*  HCT 38.3 37.9 37.3  MCV 82.7 83.1 83.6  MCH 24.8* 25.2* 25.6*  MCHC 30.0 30.3 30.6  RDW 14.2 14.4 14.5  PLT 194 208 210   BNPNo results for input(s): BNP, PROBNP in the last 168 hours.  DDimer No results for input(s): DDIMER in the last 168 hours.   Radiology/Studies:  ECHOCARDIOGRAM COMPLETE  Result Date: 07/27/2019    ECHOCARDIOGRAM REPORT   Patient Name:   [name] [name] Date of Exam: 07/27/2019 Medical Rec #:  [id number]         Height:       63.0 in Accession #:    [id number]        Weight:       175.9  lb Date of Birth:  1950/10/27        BSA:          1.831 m Patient Age:    30 years          BP:           132/85 mmHg Patient Gender: F                 HR:           93 bpm. Exam Location:  Inpatient Procedure: 2D Echo, Cardiac Doppler and Color Doppler Indications:    R94.31 Abnormal EKG; I48.91* Unspeicified atrial fibrillation  History:        Patient has prior history of Echocardiogram examinations, most                 recent 02/23/2011. Abnormal ECG, Arrythmias:Atrial Fibrillation;                 Risk Factors:Hypertension, Diabetes and Dyslipidemia.                 Pericardial effusion.  Sonographer:    [name] RDCS Referring Phys: [phone] BRIAN MILLER IMPRESSIONS  1. Trivial pericardial effusion is present. The pericardial effusion is circumferential.  2. Left ventricular ejection fraction, by estimation, is 40 to 45%. The left ventricle has mildly decreased function. The left ventricle demonstrates global  hypokinesis. There is moderate left ventricular hypertrophy. Left ventricular diastolic parameters are indeterminate.  3. Right ventricular systolic function is normal. The right ventricular size is normal. There is moderately elevated pulmonary artery systolic pressure. The estimated right ventricular systolic pressure is 59.1 mmHg.  4. The mitral valve is normal in structure. Trivial mitral valve regurgitation. No evidence of mitral stenosis.  5. Tricuspid valve regurgitation is moderate.  6. The aortic valve is tricuspid. Aortic valve regurgitation is not visualized. Mild aortic valve sclerosis is present, with no evidence of aortic valve stenosis.  7. The inferior vena cava is normal in size with greater than 50% respiratory variability, suggesting right atrial pressure of 3 mmHg. FINDINGS  Left Ventricle: Left ventricular ejection fraction, by estimation, is 40 to 45%. The left ventricle has mildly decreased function. The left ventricle demonstrates global hypokinesis. The left ventricular internal cavity size was normal in size. There is  moderate left ventricular hypertrophy. Left ventricular diastolic parameters are indeterminate. Right Ventricle: The right ventricular size is normal. No increase in right ventricular wall thickness. Right ventricular systolic function is normal. There is moderately elevated pulmonary artery systolic pressure. The tricuspid regurgitant velocity is 3.32 m/s, and with an assumed right atrial pressure of 15 mmHg, the estimated right ventricular systolic pressure is 59.1 mmHg. Left Atrium: Left atrial size was normal in size. Right Atrium: Right atrial size was normal in size. Pericardium: Trivial pericardial effusion is present. The pericardial effusion is circumferential. There is no evidence of cardiac tamponade. Mitral Valve: The mitral valve is normal in structure. Normal mobility of the mitral valve leaflets. Trivial mitral valve regurgitation. No evidence of mitral valve  stenosis. Tricuspid Valve: The tricuspid valve is normal in structure. Tricuspid valve regurgitation is moderate . No evidence of tricuspid stenosis. Aortic Valve: The aortic valve is tricuspid. Aortic valve regurgitation is not visualized. Mild aortic valve sclerosis is present, with no evidence of aortic valve stenosis. Aortic valve peak gradient measures 25.0 mmHg. Pulmonic Valve: The pulmonic valve was normal in structure. Pulmonic valve regurgitation is not visualized. No evidence of pulmonic stenosis. Aorta: The aortic  root is normal in size and structure. Venous: The inferior vena cava is normal in size with greater than 50% respiratory variability, suggesting right atrial pressure of 3 mmHg. IAS/Shunts: No atrial level shunt detected by color flow Doppler. Additional Comments: Trivial pericardial effusion is present. The pericardial effusion is circumferential.  LEFT VENTRICLE PLAX 2D LVIDd:         3.30 cm LVIDs:         2.60 cm LV PW:         1.50 cm LV IVS:        1.60 cm LVOT diam:     1.90 cm LV SV:         27 LV SV Index:   15 LVOT Area:     2.84 cm  LV Volumes (MOD) LV vol d, MOD A2C: 39.1 ml LV vol d, MOD A4C: 48.0 ml LV vol s, MOD A2C: 29.9 ml LV vol s, MOD A4C: 27.4 ml LV SV MOD A2C:     9.2 ml LV SV MOD A4C:     48.0 ml LV SV MOD BP:      14.1 ml RIGHT VENTRICLE            IVC RV S prime:     5.59 cm/s  IVC diam: 1.80 cm TAPSE (M-mode): 0.7 cm LEFT ATRIUM             Index       RIGHT ATRIUM           Index LA diam:        4.30 cm 2.35 cm/m  RA Area:     10.10 cm LA Vol (A2C):   26.6 ml 14.53 ml/m RA Volume:   19.70 ml  10.76 ml/m LA Vol (A4C):   41.6 ml 22.72 ml/m LA Biplane Vol: 35.7 ml 19.50 ml/m  AORTIC VALVE AV Area (Vmax): 0.76 cm AV Vmax:        250.00 cm/s AV Peak Grad:   25.0 mmHg LVOT Vmax:      67.20 cm/s LVOT Vmean:     51.600 cm/s LVOT VTI:       0.094 m  AORTA Ao Root diam: 3.60 cm Ao Asc diam:  3.30 cm MITRAL VALVE                TRICUSPID VALVE MV Area (PHT): 4.07 cm      TR Peak grad:   44.1 mmHg MV Decel Time: 187 msec     TR Vmax:        332.00 cm/s MV E velocity: 122.00 cm/s                             SHUNTS                             Systemic VTI:  0.09 m                             Systemic Diam: 1.90 cm Donato Schultz MD Electronically signed by Donato Schultz MD Signature Date/Time: 07/27/2019/2:04:58 PM    Final     Assessment and Plan:   # Paroxysmal atrial fibrillation:  # SSS:  Rates remain poorly controlled on current doses of metoprolol and dilitiazem.  However she also has intermittent pauses 1.7s.  She reports snoring and I suspect  OSA may be contributing.  She will need an outpatient sleep study.  Given reduced LVEF, would like to avoid CCB if possible.  It is likely that her cardiomyopathy is rate related and would improve with rate control.  Therefore if diltiazem cannot be avoided it is okay.  We cannot further titrate her nodal agents due to bradycardia and pauses.  We will plan to get a TEE and cardioversion tomorrow.  Seems as though this is atypical atrial flutter.  However at times it also looks like atrial tachycardia, as the rate is slightly slower than I would expect for atrial flutter.  Initial EKGs looked as though there may have been some atrial fibrillation.  Will ask EP to review as well.  If it is atrial tachycardia she would not need a TEE or anticoagulation.  Continue Eliquis, diltiazem and metoprolol for now.  # Acute systolic and diastolic heart failure:  LVEF 53-97% down from 55% in 2013.  She is euvolemic.  It is likely rate related as above.  Consider adding an ARB post cardioversion if blood pressure tolerates.      For questions or updates, please contact [facility] Please consult [email or web address] for contact info under    Signed, Chilton Si, MD  07/30/2019 6:02 PM

## 2019-07-30 NOTE — Progress Notes (Signed)
Family Medicine Teaching Service Daily Progress Note Intern Pager: [phone]  Patient name: [name] [name] Medical record number: [id number] Date of birth: [birth year] Age: 69 y.o. Gender: female  Primary Care Provider: Dana Allan, MD Consultants: Cardiology Code Status: Full  Pt Overview and Major Events to Date:  Admitted 7/15  Assessment and Plan: [name] is a 69 year old female presented with palpitations and weakness, found to have new onset A. fib with RVR.  PMH significant for HTN, uncontrolled DM, history of pericardial effusion, mixed hyperlipidemia.  New onset atrial fibrillationwith JYN:WGNFA, improving. Last 24 hours, pulse 64-120, BP systolic 108-142, diastolic 74-104.  SPO2 97-100%.  Nurse ambulated patient this morning, heart rate was 110 at rest, 120 bpm while walking; after 20 minutes rest, heart rate 90-103. -Consult cardiology, appreciate recommendations -PT: No follow-up recommended -OT: Awaiting recommendations -Continue following vitals -Starting PO diltiazem 360 mg extended release daily this a.m., monitor HR -Continue metoprolol 50 mg twice daily -Continue Eliquis 5 mg twice daily -Recommend outpatient cardiology follow-up -Recommend outpatient sleep study to rule out OSA  Hypertension:Chronic, poorly controlled. Last 24 hours, pulse 64-120, BP systolic 108-142, diastolic 74-104.  SPO2 97-100%.  -Continue metoprolol and diltiazem as above -Continue to monitor BP  Type 2 Diabetes:Chronic, poorly controlled. Admission A1c greater than 15.  FS glucose this morning 113. -Continue moderate sliding scale coverage -Continue Lantus 25 units subcu nightly -Continue to follow-up FS glucose -We will send home with insulin -We will recommend restarting home olmesartan and semaglutide outpatient with PCP  Hyperlipidemia:Chronic, stable. History of mixed  hyperlipidemia. Prescribed atorvastatin at home, not taking. -Continue Lipitor 40 mg daily -Follow-up Lipitor response outpatient with PCP  H/o SmallPericardial effusion History of pericardial effusion in 2013. Lost to follow-up. Echo showed trivial circumferential pericardial effusion, LVEF 40 to 45%, LV global hypokinesis, moderately elevated pulmonary artery systolic pressure, moderate tricuspid regurgitation. -We will need outpatient cardiology follow-up   FEN/GI: Modified carb diet PPx: Eliquis 5 mg twice daily  Disposition: Cardiac telemetry  Subjective:  [name] was found sitting up in bed this morning, awake, alert, and conversational.  She said she had been feeling much better than she had before, as she was able to do her ADLs in the hospital room without being short of breath or fatigued.  She was in very good spirits.  I explained that our main goal at this point in her hospital stay was to get her heart rate down to normal level before discharge.  She was agreeable, and is looking forward to discharge home.  Objective: Temp:  [97.8 F (36.6 C)-98.3 F (36.8 C)] 97.9 F (36.6 C) (07/19 1110) Pulse Rate:  [59-120] 59 (07/19 1110) Resp:  [18-22] 20 (07/19 1110) BP: (93-139)/(66-95) 93/66 (07/19 1110) SpO2:  [95 %-100 %] 95 % (07/19 1110) Weight:  [82 kg] 82 kg (07/19 0500)  Physical Exam: General: Alert, oriented Cardiovascular: Regular rate and rhythm with intermittent irregularities, no murmurs auscultated Respiratory: Lungs clear to auscultation bilaterally Extremities: No BLE edema  Laboratory: Recent Labs  Lab 07/28/19 0349 07/29/19 0227 07/30/19 0401  WBC 7.8 7.3 6.3  HGB 11.5* 11.5* 11.4*  HCT 38.3 37.9 37.3  PLT 194 208 210   Recent Labs  Lab 07/27/19 0449 07/27/19 0449 07/28/19 0349 07/29/19 0227 07/30/19 0401  NA 137   < > 136 136 138  K 3.7   < > 3.7 4.1 4.0  CL 101   < > 104 108 107  CO2 24   < > 23  20* 25  BUN 8   < > 13 11 11    CREATININE 0.71   < > 0.69 0.63 0.77  CALCIUM 8.2*   < > 8.2* 8.5* 8.5*  PROT 6.6  --  6.1*  --   --   BILITOT 0.4  --  0.6  --   --   ALKPHOS 94  --  83  --   --   ALT 66*  --  51*  --   --   AST 28  --  24  --   --   GLUCOSE 330*   < > 205* 141* 152*   < > = values in this interval not displayed.    Imaging/Diagnostic Tests: No results found.  , MD 07/30/2019, 3:27 PM PGY-1, [facility] FPTS Intern pager: [phone], text pages welcome

## 2019-07-30 NOTE — Plan of Care (Signed)

## 2019-07-31 ENCOUNTER — Encounter (HOSPITAL_COMMUNITY): Payer: Self-pay | Admitting: Family Medicine

## 2019-07-31 ENCOUNTER — Inpatient Hospital Stay (HOSPITAL_COMMUNITY): Payer: Medicare Other | Admitting: Certified Registered"

## 2019-07-31 ENCOUNTER — Inpatient Hospital Stay (HOSPITAL_COMMUNITY): Payer: Medicare Other

## 2019-07-31 ENCOUNTER — Encounter (HOSPITAL_COMMUNITY)
Admission: EM | Disposition: A | Discharge status: Home / Self Care | Payer: Self-pay | Source: Home / Self Care | Attending: Family Medicine

## 2019-07-31 DIAGNOSIS — I361 Nonrheumatic tricuspid (valve) insufficiency: Secondary | ICD-10-CM

## 2019-07-31 DIAGNOSIS — I5041 Acute combined systolic (congestive) and diastolic (congestive) heart failure: Secondary | ICD-10-CM

## 2019-07-31 DIAGNOSIS — I4891 Unspecified atrial fibrillation: Secondary | ICD-10-CM | POA: Diagnosis not present

## 2019-07-31 DIAGNOSIS — I471 Supraventricular tachycardia: Secondary | ICD-10-CM

## 2019-07-31 LAB — PROTIME-INR
INR: 1.3 — ABNORMAL HIGH (ref 0.8–1.2)
Prothrombin Time: 15.8 seconds — ABNORMAL HIGH (ref 11.4–15.2)

## 2019-07-31 LAB — GLUCOSE, CAPILLARY
Glucose-Capillary: 89 mg/dL (ref 70–99)
Glucose-Capillary: 81 mg/dL (ref 70–99)
Glucose-Capillary: 84 mg/dL (ref 70–99)
Glucose-Capillary: 220 mg/dL — ABNORMAL HIGH (ref 70–99)
Glucose-Capillary: 137 mg/dL — ABNORMAL HIGH (ref 70–99)

## 2019-07-31 LAB — CBC
HCT: 37.7 % (ref 36.0–46.0)
Hemoglobin: 11.6 g/dL — ABNORMAL LOW (ref 12.0–15.0)
MCH: 25.5 pg — ABNORMAL LOW (ref 26.0–34.0)
MCHC: 30.8 g/dL (ref 30.0–36.0)
MCV: 82.9 fL (ref 80.0–100.0)
Platelets: 220 10*3/uL (ref 150–400)
RBC: 4.55 MIL/uL (ref 3.87–5.11)
RDW: 14.6 % (ref 11.5–15.5)
WBC: 6.7 10*3/uL (ref 4.0–10.5)
nRBC: 0 % (ref 0.0–0.2)

## 2019-07-31 LAB — BASIC METABOLIC PANEL
Anion gap: 7 (ref 5–15)
BUN: 10 mg/dL (ref 8–23)
CO2: 26 mmol/L (ref 22–32)
Calcium: 9 mg/dL (ref 8.9–10.3)
Chloride: 107 mmol/L (ref 98–111)
Creatinine, Ser: 0.7 mg/dL (ref 0.44–1.00)
GFR calc Af Amer: >60 mL/min (ref >60)
GFR calc non Af Amer: >60 mL/min (ref >60)
Glucose, Bld: 167 mg/dL — ABNORMAL HIGH (ref 70–99)
Potassium: 4.2 mmol/L (ref 3.5–5.1)
Sodium: 140 mmol/L (ref 135–145)

## 2019-07-31 SURGERY — ECHOCARDIOGRAM, TRANSESOPHAGEAL
Anesthesia: Monitor Anesthesia Care

## 2019-07-31 MED ORDER — LACTATED RINGERS IV SOLN: INTRAVENOUS | Status: DC

## 2019-07-31 MED ORDER — PERFLUTREN LIPID MICROSPHERE
INTRAVENOUS | Status: DC | PRN
  Administered 2019-07-31: 2 mL via INTRAVENOUS

## 2019-07-31 MED ORDER — PERFLUTREN LIPID MICROSPHERE
INTRAVENOUS | Status: AC
  Filled 2019-07-31: qty 10

## 2019-07-31 MED ORDER — PROPOFOL 10 MG/ML IV BOLUS
INTRAVENOUS | Status: DC | PRN
  Administered 2019-07-31 (×3): 20 mg via INTRAVENOUS

## 2019-07-31 MED ORDER — SACUBITRIL-VALSARTAN 24-26 MG PO TABS
1.0000 | ORAL_TABLET | Freq: Two times a day (BID) | ORAL | Status: DC
  Administered 2019-08-01: 1 via ORAL
  Filled 2019-07-31: qty 1

## 2019-07-31 MED ORDER — PHENOL 1.4 % MT LIQD
1.0000 | OROMUCOSAL | Status: DC | PRN
  Administered 2019-07-31: 1 via OROMUCOSAL
  Filled 2019-07-31: qty 177

## 2019-07-31 MED ORDER — PROPOFOL 500 MG/50ML IV EMUL
INTRAVENOUS | Status: DC | PRN
  Administered 2019-07-31: 125 ug/kg/min via INTRAVENOUS

## 2019-07-31 NOTE — CV Procedure (Signed)
TEE/CARDIOVERSION NOTE  TRANSESOPHAGEAL ECHOCARDIOGRAM (TEE):  Indictation: Atrial Tachycardia  Consent:   Informed consent was obtained prior to the procedure. The risks, benefits and alternatives for the procedure were discussed and the patient comprehended these risks.  Risks include, but are not limited to, cough, sore throat, vomiting, nausea, somnolence, esophageal and stomach trauma or perforation, bleeding, low blood pressure, aspiration, pneumonia, infection, trauma to the teeth and death.    Time Out: Verified patient identification, verified procedure, site/side was marked, verified correct patient position, special equipment/implants available, medications/allergies/relevent history reviewed, required imaging and test results available. Performed  Procedure:  After a procedural time-out, the patient was given propofol per anesthesia for sedation. The patient's heart rate, blood pressure, and oxygen saturation are monitored continuously during the procedure. The oropharynx was anesthetized with topical cetacaine.  The transesophageal probe was inserted in the esophagus and stomach without difficulty and multiple views were obtained. Agitated microbubble saline contrast was not administered. Definity contrast was administered.  Complications:    Complications: None Patient did tolerate procedure well.  Findings:  1. LEFT VENTRICLE: The left ventricular wall thickness is moderately increased.  The left ventricular cavity is normal in size. Wall motion is globally hypokinetic.  LVEF is 40-45%.  2. RIGHT VENTRICLE:  The right ventricle is normal in structure and function without any thrombus or masses.    3. LEFT ATRIUM:  The left atrium is normal in size without any thrombus or masses.  There is spontaneous echo contrast ("smoke") in the left atrium consistent with a low flow state.  4. LEFT ATRIAL APPENDAGE:  The left atrial appendage is large and notable to contain heavy  smoke. Definity contrast imaging was performed and did not suggest thrombus.  The appendage has single lobes. Pulse doppler indicates moderate flow in the appendage.  5. ATRIAL SEPTUM:  The atrial septum appears intact and is free of thrombus and/or masses.  There is no evidence for interatrial shunting by color doppler and Definity contrast.  6. RIGHT ATRIUM:  The right atrium is normal in size and function without any thrombus or masses.  7. MITRAL VALVE:  The mitral valve is normal in structure and function with trivial regurgitation.  There were no vegetations or stenosis.  8. AORTIC VALVE:  The aortic valve is trileaflet, normal in structure and function with no regurgitation.  There were no vegetations or stenosis  9. TRICUSPID VALVE:  The tricuspid valve is normal in structure and function with Moderate regurgitation.  There were no vegetations or stenosis  10.  PULMONIC VALVE:  The pulmonic valve is normal in structure and function with trivial regurgitation.  There were no vegetations or stenosis.   11. AORTIC ARCH, ASCENDING AND DESCENDING AORTA:  There was no Myrtis Ser et. Al, 1992) atherosclerosis of the ascending aorta, aortic arch, or proximal descending aorta.  12. PULMONARY VEINS: Anomalous pulmonary venous return was not noted.  13. PERICARDIUM: The pericardium appeared normal and non-thickened.  There is a trivial posterior pericardial effusion.  CARDIOVERSION:     Second Time Out: Verified patient identification, verified procedure, site/side was marked, verified correct patient position, special equipment/implants available, medications/allergies/relevent history reviewed, required imaging and test results available.  Performed  Procedure:  1. Patient placed on cardiac monitor, pulse oximetry, supplemental oxygen as necessary.  2. Sedation administered per anesthesia 3. Pacer pads placed anterior and posterior chest. 4. Cardioverted 1 time(s).  5. Cardioverted at 120J  biphasic.  Complications:  Complications: None Patient did tolerate procedure well.  Impression:  1. No LAA thrombus by Definity contrast, but heavy LA smoke 2. Negative for PFO 3. Moderate TR 4. LVEF 40-45% with global hypokinesis, moderate LVH 5. Trivial pericardial effusion 6. Successful DCCV with a single 120J biphasic shock  Recommendations:  1.  Management per primary cardiology service.  Time Spent Directly with the Patient:  60 minutes   Chrystie Nose, MD, [facility], FACP  [place]  [facility]  Medical Director of the [facility] &  Cardiovascular Risk Reduction Clinic Diplomate of the American Board of Clinical Lipidology Attending Cardiologist  Direct Dial: [phone]  Fax: [phone]  Website:  [email or web address] 07/31/2019, 11:46 AM

## 2019-07-31 NOTE — H&P (Signed)
   INTERVAL PROCEDURE H&P  History and Physical Interval Note:  07/31/2019 9:43 AM  [name] has presented today for their planned procedure. The various methods of treatment have been discussed with the patient and family. After consideration of risks, benefits and other options for treatment, the patient has consented to the procedure.  The patients' outpatient history has been reviewed, patient examined, and no change in status from most recent office note within the past 30 days. I have reviewed the patients' chart and labs and will proceed as planned. Questions were answered to the patient's satisfaction.   Chrystie Nose, MD, [facility], FACP  [place]  [facility]  Medical Director of the [facility] Lipid Disorders &  Cardiovascular Risk Reduction Clinic Diplomate of the American Board of Clinical Lipidology Attending Cardiologist  Direct Dial: [phone]  Fax: [phone]  Website:  [email or web address] 07/31/2019, 9:43 AM

## 2019-07-31 NOTE — Progress Notes (Signed)
  Echocardiogram Echocardiogram Transesophageal has been performed.  [name] [name] 07/31/2019, 11:29 AM

## 2019-07-31 NOTE — Anesthesia Procedure Notes (Signed)
Procedure Name: MAC Date/Time: 07/31/2019 10:44 AM Performed by: Amadeo Garnet, CRNA Pre-anesthesia Checklist: Patient identified, Emergency Drugs available, Patient being monitored and Suction available Patient Re-evaluated:Patient Re-evaluated prior to induction Oxygen Delivery Method: Nasal cannula Preoxygenation: Pre-oxygenation with 100% oxygen Induction Type: IV induction Placement Confirmation: positive ETCO2 Dental Injury: Teeth and Oropharynx as per pre-operative assessment

## 2019-07-31 NOTE — Anesthesia Preprocedure Evaluation (Signed)
Anesthesia Evaluation  Patient identified by MRN, date of birth, ID band Patient awake    Reviewed: Allergy & Precautions, NPO status , Patient's Chart, lab work & pertinent test results  Airway Mallampati: II  TM Distance: >3 FB Neck ROM: Full    Dental  (+) Teeth Intact   Pulmonary    breath sounds clear to auscultation       Cardiovascular hypertension,  Rhythm:Irregular Rate:Tachycardia     Neuro/Psych    GI/Hepatic   Endo/Other    Renal/GU      Musculoskeletal   Abdominal   Peds  Hematology   Anesthesia Other Findings   Reproductive/Obstetrics                             Anesthesia Physical Anesthesia Plan  ASA: III  Anesthesia Plan: MAC   Post-op Pain Management:    Induction: Intravenous  PONV Risk Score and Plan: Ondansetron and Dexamethasone  Airway Management Planned: Natural Airway and Nasal Cannula  Additional Equipment:   Intra-op Plan:   Post-operative Plan:   Informed Consent: I have reviewed the patients History and Physical, chart, labs and discussed the procedure including the risks, benefits and alternatives for the proposed anesthesia with the patient or authorized representative who has indicated his/her understanding and acceptance.     Dental advisory given  Plan Discussed with: CRNA and Anesthesiologist  Anesthesia Plan Comments:         Anesthesia Quick Evaluation

## 2019-07-31 NOTE — Progress Notes (Signed)
Just spoke with Dr. Duke Salvia, cardiology, regarding plan for cardiac meds after today's successful cardioversion.  Dr. Duke Salvia recommends the below:  -Stop diltiazem -Stop home olmesartan -Continue metoprolol 50 mg -Start Entresto 24-26 mg tomorrow morning  Vonita Moss, MD

## 2019-07-31 NOTE — Plan of Care (Signed)

## 2019-07-31 NOTE — Plan of Care (Signed)

## 2019-07-31 NOTE — Progress Notes (Signed)
   07/31/19 0709  Assess: MEWS Score  Temp 97.9 F (36.6 C)  BP (!) 150/100  Pulse Rate (!) 120  ECG Heart Rate (!) 124  Resp 20  Level of Consciousness Alert  SpO2 98 %  O2 Device Room Air  Assess: MEWS Score  MEWS Temp 0  MEWS Systolic 0  MEWS Pulse 2  MEWS RR 0  MEWS LOC 0  MEWS Score 2  MEWS Score Color Yellow  Assess: if the MEWS score is Yellow or Red  Were vital signs taken at a resting state? Yes  Focused Assessment No change from prior assessment  Early Detection of Sepsis Score *See Row Information* Low  MEWS guidelines implemented *See Row Information* Yes  Treat  MEWS Interventions Other (Comment) (paged krista pa)  Pain Scale 0-10  Pain Score 0  Take Vital Signs  Increase Vital Sign Frequency  Yellow: Q 2hr X 2 then Q 4hr X 2, if remains yellow, continue Q 4hrs  Escalate  MEWS: Escalate Yellow: discuss with charge nurse/RN and consider discussing with provider and RRT  Notify: Charge Nurse/RN  Name of Charge Nurse/RN Notified dawn  Date Charge Nurse/RN Notified 07/31/19  Time Charge Nurse/RN Notified 0730  Notify: Provider  Provider Name/Title Dot Lanes kroeger  Date Provider Notified 07/31/19  Time Provider Notified [phone]  Notification Type Page  Notification Reason Other (Comment) (protocol)  Response No new orders  Date of Provider Response 07/31/19  Time of Provider Response 0730  Document  Patient Outcome Not stable and remains on department  Progress note created (see row info) Yes   Paged krista kreoger pa, morning meds given, will continue to monitor, scheduled for cardioversion today.

## 2019-07-31 NOTE — Anesthesia Postprocedure Evaluation (Signed)
Anesthesia Post Note  Patient: [name] [name]  Procedure(s) Performed: TRANSESOPHAGEAL ECHOCARDIOGRAM (TEE) (N/A ) CARDIOVERSION (N/A )     Patient location during evaluation: Endoscopy Anesthesia Type: MAC Level of consciousness: awake and alert Pain management: pain level controlled Vital Signs Assessment: post-procedure vital signs reviewed and stable Respiratory status: spontaneous breathing, nonlabored ventilation, respiratory function stable and patient connected to nasal cannula oxygen Cardiovascular status: stable and blood pressure returned to baseline Postop Assessment: no apparent nausea or vomiting Anesthetic complications: no   No complications documented.  Last Vitals:  Vitals:   07/31/19 1209 07/31/19 1504  BP: (!) 145/93 139/80  Pulse: 78 80  Resp: 15 16  Temp: 36.4 C 36.6 C  SpO2: 98% 98%    Last Pain:  Vitals:   07/31/19 1504  TempSrc: Oral  PainSc: 0-No pain                 Zoi Devine COKER

## 2019-07-31 NOTE — Progress Notes (Signed)
Family Medicine Teaching Service Daily Progress Note Intern Pager: [phone]  Patient name: [name] [name] Medical record number: [id number] Date of birth: [birth year] Age: 69 y.o. Gender: female  Primary Care Provider: Dana Allan, MD Consultants: Cardiology Code Status: Full  Pt Overview and Major Events to Date:  Admitted 7/15  Assessment and Plan: [name] is a 69 year old female who presented with palpitations and weakness, found to have new onset A. fib with RVR.  PMH significant for HTN, uncontrolled DM, history of pericardial effusion, mixed hyperlipidemia.  New onset atrial fibrillationwith PPI:RJJOA, improving. Last 24 hours, heart rate 59-121, systolic BP 93-144, diastolic 41-66.  SPO2 95-100%. Cardiology recommended TEE with potential cardioversion. Cards recommended possibly adding ARB after CV. -We will follow up cardiology recommendations s/p TEE+/- CV -OT: Awaiting recommendations -Continue Eliquis 5 mg twice daily -Continue diltiazem 24-hour capsule 360 mg daily -Continue metoprolol 50 mg twice daily -Recommend outpatient sleep study to rule out OSA  Hypertension:Chronic, poorly controlled. Last 24 hours, heart rate 59-121, systolic BP 93-144, diastolic 06-30.  SPO2 95-100%. -Continue metoprolol, diltiazem as above -Continue to monitor BP  Type 2 Diabetes:Chronic, poorly controlled. Admission A1c greater than 15.  FS glucose this morning 89. -Continue moderate sliding cell coverage -Continue Lantus 25 units subcu nightly -Continue to follow FS glucose -We will send home with insulin -Recommend restarting home olmesartan and semaglutide outpatient with PCP  Hyperlipidemia:Chronic, stable. History of mixed hyperlipidemia. Prescribed atorvastatin at home, not taking. -Continue Lipitor 40 mg daily  -Follow-up Lipitor response outpatient with PCP  H/o  SmallPericardial effusion History of pericardial effusion in 2013. Lost to follow-up. Echo showed trivial circumferential pericardial effusion, LVEF 40 to 45%, LV global hypokinesis, moderately elevated pulmonary artery systolic pressure, moderate tricuspid regurgitation. -We will need outpatient cardiology follow-up  FEN/GI: Modified carb diet PPx: Eliquis 5 mg twice daily  Disposition: Cardiac, drain  Subjective:  [name] was found standing at the sink brushing her teeth upon entry to the room. She was alert, conversational, and pleasant. She was a little anxious about today's TTE +/- CV. We discussed her questions and eased her concerns. She reported feeling better than the day before, saying she was able to get up and around her room without feeling fatigued or out of breath. No f/c/n/v.   Objective: Temp:  [97.8 F (36.6 C)-98.3 F (36.8 C)] 98.3 F (36.8 C) (07/20 1015) Pulse Rate:  [59-124] 124 (07/20 1015) Resp:  [10-20] 18 (07/20 1015) BP: (93-174)/(66-120) 174/120 (07/20 1015) SpO2:  [95 %-100 %] 100 % (07/20 1015) Weight:  [82 kg] 82 kg (07/20 1015)  Physical Exam: General: awake, alert, oriented Cardiovascular: irregularly irregular rhythm Respiratory: CTAB Extremities: No BLE edema  Laboratory: Recent Labs  Lab 07/29/19 0227 07/30/19 0401 07/31/19 0255  WBC 7.3 6.3 6.7  HGB 11.5* 11.4* 11.6*  HCT 37.9 37.3 37.7  PLT 208 210 220   Recent Labs  Lab 07/27/19 0449 07/27/19 0449 07/28/19 0349 07/28/19 0349 07/29/19 0227 07/30/19 0401 07/31/19 0255  NA 137   < > 136   < > 136 138 140  K 3.7   < > 3.7   < > 4.1 4.0 4.2  CL 101   < > 104   < > 108 107 107  CO2 24   < > 23   < > 20* 25 26  BUN 8   < > 13   < > 11 11 10   CREATININE 0.71   < > 0.69   < >  0.63 0.77 0.70  CALCIUM 8.2*   < > 8.2*   < > 8.5* 8.5* 9.0  PROT 6.6  --  6.1*  --   --   --   --   BILITOT 0.4  --  0.6  --   --   --   --   ALKPHOS 94  --  83  --   --   --   --   ALT 66*  --   51*  --   --   --   --   AST 28  --  24  --   --   --   --   GLUCOSE 330*   < > 205*   < > 141* 152* 167*   < > = values in this interval not displayed.    Imaging/Diagnostic Tests: No results found.   Fayette Pho, MD 07/31/2019, 10:44 AM PGY-1, [facility] Family Medicine FPTS Intern pager: [phone], text pages welcome

## 2019-07-31 NOTE — Progress Notes (Signed)
Progress Note  Patient Name: [name] [name] Date of Encounter: 07/31/2019  [facility] Cardiologist: Kristeen Miss, MD   Subjective   Feeling well.  Okay with getting TEE/cardioversion later today.  Inpatient Medications    Scheduled Meds: . apixaban  5 mg Oral BID  . atorvastatin  40 mg Oral Daily  . diltiazem  360 mg Oral Daily  . insulin aspart  0-15 Units Subcutaneous TID WC  . insulin aspart  0-5 Units Subcutaneous QHS  . insulin glargine  25 Units Subcutaneous QHS  . melatonin  5 mg Oral QHS  . metoprolol tartrate  50 mg Oral BID  . polyethylene glycol  17 g Oral Daily  . senna  1 tablet Oral Daily  . sodium chloride flush  3 mL Intravenous Once   Continuous Infusions:  PRN Meds: acetaminophen (TYLENOL) oral liquid 160 mg/5 mL, ondansetron (ZOFRAN) IV   Vital Signs    Vitals:   07/30/19 2354 07/31/19 0000 07/31/19 0300 07/31/19 0709  BP: 126/83  (!) 136/91 (!) 150/100  Pulse: (!) 119  (!) 119 (!) 120  Resp: 10 19 16 20   Temp: 98.2 F (36.8 C)  97.8 F (36.6 C) 97.9 F (36.6 C)  TempSrc: Oral  Oral Oral  SpO2: 100%  99% 98%  Weight:      Height:       No intake or output data in the 24 hours ending 07/31/19 0828 Last 3 Weights 07/30/2019 07/29/2019 07/28/2019  Weight (lbs) 180 lb 12.4 oz 181 lb 3.5 oz 179 lb 14.3 oz  Weight (kg) 82 kg 82.2 kg 81.6 kg      Telemetry    2-1 atrial flutter.  Ventricular rate 120s- Personally Reviewed  ECG    07/31/2019: 2: 1 atrial flutter.  Rate 122 bpm. - Personally Reviewed  Physical Exam   VS:  BP (!) 150/100 (BP Location: Right Arm)   Pulse (!) 120   Temp 97.9 F (36.6 C) (Oral)   Resp 20   Ht 5' 2.99" (1.6 m)   Wt 82 kg   SpO2 98%   BMI 32.03 kg/m  , BMI Body mass index is 32.03 kg/m. GENERAL:  Well appearing HEENT: Pupils equal round and reactive, fundi not visualized, oral mucosa unremarkable NECK:  No jugular venous distention, waveform within normal limits, carotid upstroke brisk and  symmetric, no bruits LUNGS:  Clear to auscultation bilaterally HEART: Tachycardic.  Regular rhythm PMI not displaced or sustained,S1 and S2 within normal limits, no S3, no S4, no clicks, no rubs, no murmurs ABD:  Flat, positive bowel sounds normal in frequency in pitch, no bruits, no rebound, no guarding, no midline pulsatile mass, no hepatomegaly, no splenomegaly EXT:  2 plus pulses throughout, no edema, no cyanosis no clubbing SKIN:  No rashes no nodules NEURO:  Cranial nerves II through XII grossly intact, motor grossly intact throughout PSYCH:  Cognitively intact, oriented to person place and time   Labs    High Sensitivity Troponin:   Recent Labs  Lab 07/26/19 1318 07/26/19 1612  TROPONINIHS 20* 18*      Chemistry Recent Labs  Lab 07/27/19 0449 07/27/19 0449 07/28/19 0349 07/28/19 0349 07/29/19 0227 07/30/19 0401 07/31/19 0255  NA 137   < > 136   < > 136 138 140  K 3.7   < > 3.7   < > 4.1 4.0 4.2  CL 101   < > 104   < > 108 107 107  CO2 24   < >  23   < > 20* 25 26  GLUCOSE 330*   < > 205*   < > 141* 152* 167*  BUN 8   < > 13   < > 11 11 10   CREATININE 0.71   < > 0.69   < > 0.63 0.77 0.70  CALCIUM 8.2*   < > 8.2*   < > 8.5* 8.5* 9.0  PROT 6.6  --  6.1*  --   --   --   --   ALBUMIN 2.7*  --  2.6*  --   --   --   --   AST 28  --  24  --   --   --   --   ALT 66*  --  51*  --   --   --   --   ALKPHOS 94  --  83  --   --   --   --   BILITOT 0.4  --  0.6  --   --   --   --   GFRNONAA >60   < > >60   < > >60 >60 >60  GFRAA >60   < > >60   < > >60 >60 >60  ANIONGAP 12   < > 9   < > 8 6 7    < > = values in this interval not displayed.     Hematology Recent Labs  Lab 07/29/19 0227 07/30/19 0401 07/31/19 0255  WBC 7.3 6.3 6.7  RBC 4.56 4.46 4.55  HGB 11.5* 11.4* 11.6*  HCT 37.9 37.3 37.7  MCV 83.1 83.6 82.9  MCH 25.2* 25.6* 25.5*  MCHC 30.3 30.6 30.8  RDW 14.4 14.5 14.6  PLT 208 210 220    BNPNo results for input(s): BNP, PROBNP in the last 168 hours.    DDimer No results for input(s): DDIMER in the last 168 hours.   Radiology    No results found.  Cardiac Studies   Echo 07/27/19:  1. Trivial pericardial effusion is present. The pericardial effusion is  circumferential.  2. Left ventricular ejection fraction, by estimation, is 40 to 45%. The  left ventricle has mildly decreased function. The left ventricle  demonstrates global hypokinesis. There is moderate left ventricular  hypertrophy. Left ventricular diastolic  parameters are indeterminate.  3. Right ventricular systolic function is normal. The right ventricular  size is normal. There is moderately elevated pulmonary artery systolic  pressure. The estimated right ventricular systolic pressure is 59.1 mmHg.  4. The mitral valve is normal in structure. Trivial mitral valve  regurgitation. No evidence of mitral stenosis.  5. Tricuspid valve regurgitation is moderate.  6. The aortic valve is tricuspid. Aortic valve regurgitation is not  visualized. Mild aortic valve sclerosis is present, with no evidence of  aortic valve stenosis.  7. The inferior vena cava is normal in size with greater than 50%  respiratory variability, suggesting right atrial pressure of 3 mmHg.   Echo 02/23/11:  Study Conclusions   - Left ventricle: Wall thickness was increased in a pattern  of mild LVH. Systolic function was normal. The estimated  ejection fraction was in the range of 55% to 60%. Wall  motion was normal; there were no regional wall motion  abnormalities. Features are consistent with a pseudonormal  left ventricular filling pattern, with concomitant  abnormal relaxation and increased filling pressure (grade  2 diastolic dysfunction). Doppler parameters are  consistent with elevated mean left atrial filling  pressure.  -  Mitral valve: Calcified annulus. Mildly thickened leaflets  .  - Left atrium: LA midly dilated by volumetric measurements  - Pericardium,  extracardiac: A small pericardial effusion  was identified.   Patient Profile     69 y.o. female  with a hx of pericardial effusion without tamponade but requiring drainage, hypertension, hyperlipidemia, uncontrolled diabetes, and bipolar disorder admitted with new onset atrial fibrillation with RVR.  Assessment & Plan    # Paroxysmal atrial fibrillation:  # SSS:  Rates remain poorly controlled on current doses of metoprolol and dilitiazem.  However she also has intermittent pauses 1.7s.  She reports snoring and I suspect OSA may be contributing.  She will need an outpatient sleep study.  Given reduced LVEF, would like to avoid CCB if possible.  It is likely that her cardiomyopathy is rate related and would improve with rate control.   We cannot further titrate her nodal agents due to bradycardia and pauses.  We will plan to get a TEE and cardioversion today.  The patient and her daughter were updated and in agreement with the plan.  Continue metoprolol and diltiazem for now.  Continue Eliquis.  # Acute systolic and diastolic heart failure:  # Essential hypertension: LVEF 40-45% down from 55% in 2013.  She is euvolemic.  It is likely rate related as above.  Consider adding an ARB and stopping diltiazem post cardioversion if blood pressure tolerates.      For questions or updates, please contact [facility] Please consult [email or web address] for contact info under        Signed, Chilton Si, MD  07/31/2019, 8:28 AM

## 2019-07-31 NOTE — Transfer of Care (Signed)
Immediate Anesthesia Transfer of Care Note  Patient: [name] [name]  Procedure(s) Performed: TRANSESOPHAGEAL ECHOCARDIOGRAM (TEE) (N/A ) CARDIOVERSION (N/A )  Patient Location: Endoscopy Unit  Anesthesia Type:MAC  Level of Consciousness: awake, alert  and oriented  Airway & Oxygen Therapy: Patient Spontanous Breathing  Post-op Assessment: Report given to RN, Post -op Vital signs reviewed and stable and Patient moving all extremities  Post vital signs: Reviewed and stable  Last Vitals:  Vitals Value Taken Time  BP    Temp    Pulse    Resp    SpO2      Last Pain:  Vitals:   07/31/19 1015  TempSrc: Oral  PainSc: 0-No pain         Complications: No complications documented.

## 2019-08-01 ENCOUNTER — Encounter (HOSPITAL_COMMUNITY): Payer: Self-pay | Admitting: Internal Medicine

## 2019-08-01 ENCOUNTER — Telehealth: Payer: Self-pay | Admitting: Cardiovascular Disease

## 2019-08-01 LAB — MAGNESIUM: Magnesium: 1.4 mg/dL — ABNORMAL LOW (ref 1.7–2.4)

## 2019-08-01 LAB — BASIC METABOLIC PANEL
Anion gap: 9 (ref 5–15)
BUN: 10 mg/dL (ref 8–23)
CO2: 25 mmol/L (ref 22–32)
Calcium: 9.1 mg/dL (ref 8.9–10.3)
Chloride: 105 mmol/L (ref 98–111)
Creatinine, Ser: 0.63 mg/dL (ref 0.44–1.00)
GFR calc Af Amer: >60 mL/min (ref >60)
GFR calc non Af Amer: >60 mL/min (ref >60)
Glucose, Bld: 167 mg/dL — ABNORMAL HIGH (ref 70–99)
Potassium: 4.1 mmol/L (ref 3.5–5.1)
Sodium: 139 mmol/L (ref 135–145)

## 2019-08-01 LAB — CBC
HCT: 37.3 % (ref 36.0–46.0)
Hemoglobin: 11.6 g/dL — ABNORMAL LOW (ref 12.0–15.0)
MCH: 25.7 pg — ABNORMAL LOW (ref 26.0–34.0)
MCHC: 31.1 g/dL (ref 30.0–36.0)
MCV: 82.5 fL (ref 80.0–100.0)
Platelets: 210 10*3/uL (ref 150–400)
RBC: 4.52 MIL/uL (ref 3.87–5.11)
RDW: 14.7 % (ref 11.5–15.5)
WBC: 7.7 10*3/uL (ref 4.0–10.5)
nRBC: 0 % (ref 0.0–0.2)

## 2019-08-01 LAB — GLUCOSE, CAPILLARY
Glucose-Capillary: 112 mg/dL — ABNORMAL HIGH (ref 70–99)
Glucose-Capillary: 169 mg/dL — ABNORMAL HIGH (ref 70–99)
Glucose-Capillary: 143 mg/dL — ABNORMAL HIGH (ref 70–99)
Glucose-Capillary: 222 mg/dL — ABNORMAL HIGH (ref 70–99)

## 2019-08-01 MED ORDER — APIXABAN 5 MG PO TABS: 5.0000 mg | ORAL_TABLET | Freq: Two times a day (BID) | ORAL | 0 refills | Status: DC

## 2019-08-01 MED ORDER — MAGNESIUM SULFATE 2 GM/50ML IV SOLN
2.0000 g | Freq: Once | INTRAVENOUS | Status: AC
  Administered 2019-08-01: 2 g via INTRAVENOUS
  Filled 2019-08-01: qty 50

## 2019-08-01 MED ORDER — METOPROLOL SUCCINATE ER 100 MG PO TB24
100.0000 mg | ORAL_TABLET | Freq: Every day | ORAL | Status: DC
  Administered 2019-08-01: 100 mg via ORAL
  Filled 2019-08-01: qty 1

## 2019-08-01 MED ORDER — "PEN NEEDLES 5/16"" 31G X 8 MM MISC": 1.0000 | Freq: Every day | 0 refills | Status: DC

## 2019-08-01 MED ORDER — ATORVASTATIN CALCIUM 40 MG PO TABS: 40.0000 mg | ORAL_TABLET | Freq: Every day | ORAL | 0 refills | Status: DC

## 2019-08-01 MED ORDER — METOPROLOL SUCCINATE ER 100 MG PO TB24: 100.0000 mg | ORAL_TABLET | Freq: Every day | ORAL | 0 refills | Status: DC

## 2019-08-01 MED ORDER — INSULIN GLARGINE 100 UNIT/ML SOLOSTAR PEN
25.0000 [IU] | PEN_INJECTOR | Freq: Every day | SUBCUTANEOUS | 0 refills | Status: DC
Start: 2019-08-01 — End: 2019-08-09

## 2019-08-01 MED ORDER — SACUBITRIL-VALSARTAN 24-26 MG PO TABS: 1.0000 | ORAL_TABLET | Freq: Two times a day (BID) | ORAL | 30 refills | Status: DC

## 2019-08-01 MED FILL — PENTIPS 31G X 8 MM MISC: 31G X 8 MM | 30 days supply | Qty: 100 | Fill #0

## 2019-08-01 MED FILL — LANTUS SOLOSTAR 100 UNITS/M: 100 | 30 days supply | Qty: 9 | Fill #0

## 2019-08-01 MED FILL — METOPROLOL SUCCINATE ER 100: 100 | 30 days supply | Qty: 30 | Fill #0

## 2019-08-01 MED FILL — ELIQUIS 5 MG TABLET: 5 | 30 days supply | Qty: 60 | Fill #0

## 2019-08-01 MED FILL — ATORVASTATIN CALCIUM 40 MG: 40 | 30 days supply | Qty: 30 | Fill #0

## 2019-08-01 MED FILL — ENTRESTO 24 MG-26 MG TABLET: 24-26 | 30 days supply | Qty: 60 | Fill #0

## 2019-08-01 NOTE — TOC Benefit Eligibility Note (Signed)
Transition of Care [facility]) Benefit Eligibility Note    Patient Details  Name: LEIDY MASSAR MRN: [id number] Date of Birth: 1950-07-30   Medication/Dose: Sherryll Burger  24-26 MG BID  Covered?: Yes  Tier: 3 Drug  Prescription Coverage Preferred Pharmacy: [facility]  Spoke with Person/Company/Phone Number:: [facility]  @ [employer] # [phone]  Co-Pay: 25 % OF TOTAL COST  Prior Approval: No  Deductible:  (NO DEDUCTIBLE  and   OUT-OF-POCKET:UNMET)  Additional Notes: SECONDARY INS: MEDICAID FOR [place]  EFF-DATE - 05-11-2017  CO-PAY- $4.00    Mardene Sayer Phone Number: 08/01/2019, 2:51 PM

## 2019-08-01 NOTE — Progress Notes (Signed)
RN provided patient with detailed verbal discharge instructions. RN answered all questions. Copy of summary provide to patient. VSS at discharge.  IV's removed per protocol. Pt belongings placed in patient belongings bag by patient. Pt took belongings bags. RN discharged patient via wheelchair to [employer] entrance to private vehicle.

## 2019-08-01 NOTE — Progress Notes (Signed)
Progress Note  Patient Name: [name] [name] Date of Encounter: 08/01/2019  [facility] Cardiologist: Kristeen Miss, MD   Subjective   Feeling well.  She is feeling better now that she is in sinus rhythm.  She is eager to go home.  Inpatient Medications    Scheduled Meds: . apixaban  5 mg Oral BID  . atorvastatin  40 mg Oral Daily  . insulin aspart  0-15 Units Subcutaneous TID WC  . insulin aspart  0-5 Units Subcutaneous QHS  . insulin glargine  25 Units Subcutaneous QHS  . melatonin  5 mg Oral QHS  . metoprolol tartrate  50 mg Oral BID  . polyethylene glycol  17 g Oral Daily  . sacubitril-valsartan  1 tablet Oral BID  . senna  1 tablet Oral Daily  . sodium chloride flush  3 mL Intravenous Once   Continuous Infusions: . magnesium sulfate bolus IVPB     PRN Meds: acetaminophen (TYLENOL) oral liquid 160 mg/5 mL, ondansetron (ZOFRAN) IV, phenol   Vital Signs    Vitals:   07/31/19 1931 07/31/19 2130 07/31/19 2338 08/01/19 0430  BP: (!) 160/91 (!) 165/104 (!) 143/65 (!) 151/87  Pulse:  85 86 77  Resp: 18  (!) 24 20  Temp: 98.3 F (36.8 C)  97.9 F (36.6 C) 97.7 F (36.5 C)  TempSrc: Oral  Oral Oral  SpO2: 98%  96% 96%  Weight:    80.5 kg  Height:        Intake/Output Summary (Last 24 hours) at 08/01/2019 0831 Last data filed at 07/31/2019 1504 Gross per 24 hour  Intake 660 ml  Output 300 ml  Net 360 ml   Last 3 Weights 08/01/2019 07/31/2019 07/30/2019  Weight (lbs) 177 lb 7.5 oz 180 lb 12.4 oz 180 lb 12.4 oz  Weight (kg) 80.5 kg 82 kg 82 kg      Telemetry    Sinus rhythm.  Short run of sinus tachycardia with PACs- Personally Reviewed  ECG    07/31/2019: 2: 1 atrial flutter.  Rate 122 bpm. - Personally Reviewed  Physical Exam   VS:  BP (!) 151/87 (BP Location: Right Arm)   Pulse 77   Temp 97.7 F (36.5 C) (Oral)   Resp 20   Ht 5\' 3"  (1.6 m)   Wt 80.5 kg   SpO2 96%   BMI 31.44 kg/m  , BMI Body mass index is 31.44 kg/m. GENERAL:  Well  appearing HEENT: Pupils equal round and reactive, fundi not visualized, oral mucosa unremarkable NECK:  No jugular venous distention, waveform within normal limits, carotid upstroke brisk and symmetric, no bruits LUNGS:  Clear to auscultation bilaterally HEART: Regular rate and rhythm. PMI not displaced or sustained,S1 and S2 within normal limits, no S3, no S4, no clicks, no rubs, no murmurs ABD:  Flat, positive bowel sounds normal in frequency in pitch, no bruits, no rebound, no guarding, no midline pulsatile mass, no hepatomegaly, no splenomegaly EXT:  2 plus pulses throughout, no edema, no cyanosis no clubbing SKIN:  No rashes no nodules NEURO:  Cranial nerves II through XII grossly intact, motor grossly intact throughout PSYCH:  Cognitively intact, oriented to person place and time   Labs    High Sensitivity Troponin:   Recent Labs  Lab 07/26/19 1318 07/26/19 1612  TROPONINIHS 20* 18*      Chemistry Recent Labs  Lab 07/27/19 0449 07/27/19 0449 07/28/19 0349 07/29/19 0227 07/30/19 0401 07/31/19 0255 08/01/19 0225  NA 137   < >  136   < > 138 140 139  K 3.7   < > 3.7   < > 4.0 4.2 4.1  CL 101   < > 104   < > 107 107 105  CO2 24   < > 23   < > 25 26 25   GLUCOSE 330*   < > 205*   < > 152* 167* 167*  BUN 8   < > 13   < > 11 10 10   CREATININE 0.71   < > 0.69   < > 0.77 0.70 0.63  CALCIUM 8.2*   < > 8.2*   < > 8.5* 9.0 9.1  PROT 6.6  --  6.1*  --   --   --   --   ALBUMIN 2.7*  --  2.6*  --   --   --   --   AST 28  --  24  --   --   --   --   ALT 66*  --  51*  --   --   --   --   ALKPHOS 94  --  83  --   --   --   --   BILITOT 0.4  --  0.6  --   --   --   --   GFRNONAA >60   < > >60   < > >60 >60 >60  GFRAA >60   < > >60   < > >60 >60 >60  ANIONGAP 12   < > 9   < > 6 7 9    < > = values in this interval not displayed.     Hematology Recent Labs  Lab 07/30/19 0401 07/31/19 0255 08/01/19 0225  WBC 6.3 6.7 7.7  RBC 4.46 4.55 4.52  HGB 11.4* 11.6* 11.6*  HCT 37.3  37.7 37.3  MCV 83.6 82.9 82.5  MCH 25.6* 25.5* 25.7*  MCHC 30.6 30.8 31.1  RDW 14.5 14.6 14.7  PLT 210 220 210    BNPNo results for input(s): BNP, PROBNP in the last 168 hours.   DDimer No results for input(s): DDIMER in the last 168 hours.   Radiology    ECHO TEE  Result Date: 07/31/2019    TRANSESOPHOGEAL ECHO REPORT   Patient Name:   [name] [name] Date of Exam: 07/31/2019 Medical Rec #:  [id number]         Height:       63.0 in Accession #:    [id number]        Weight:       180.8 lb Date of Birth:  [birth year]        BSA:          1.852 m Patient Age:    68 years          BP:           174/120 mmHg Patient Gender: F                 HR:           122 bpm. Exam Location:  Inpatient Procedure: Transesophageal Echo, Cardiac Doppler, Color Doppler and Intracardiac            Opacification Agent Indications:     R94.31 Abnormal EKG  History:         Patient has prior history of Echocardiogram examinations, most  recent 07/27/2019. CHF, Abnormal ECG, Arrythmias:Atrial                  tachycardia and Atrial Fibrillation; Risk Factors:Diabetes.                  Pericardial effusion.  Sonographer:     [name] RDCS Referring Phys:  [id number] Lisette Abu HILTY Diagnosing Phys: Zoila Shutter MD PROCEDURE: After discussion of the risks and benefits of a TEE, an informed consent was obtained from the patient. The transesophogeal probe was passed without difficulty through the esophogus of the patient. Imaged were obtained with the patient in a supine position. Sedation performed by different physician. The patient was monitored while under deep sedation. Anesthestetic sedation was provided intravenously by Anesthesiology: 185mg  of Propofol. Image quality was good. The patient's vital signs; including heart rate, blood pressure, and oxygen saturation; remained stable throughout the procedure. The patient developed no complications during the procedure. A successful direct current cardioversion was  performed at 120 joules with 1 attempt. IMPRESSIONS  1. Left ventricular ejection fraction, by estimation, is 40 to 45%. The left ventricle has mildly decreased function. The left ventricle demonstrates global hypokinesis. There is moderate left ventricular hypertrophy.  2. Right ventricular systolic function is normal. The right ventricular size is normal. There is moderately elevated pulmonary artery systolic pressure. The estimated right ventricular systolic pressure is 57.0 mmHg.  3. Left atrial size was mildly dilated. No left atrial/left atrial appendage thrombus was detected. The LAA emptying velocity was 30 cm/s.  4. The mitral valve is grossly normal. Trivial mitral valve regurgitation.  5. Tricuspid valve regurgitation is moderate.  6. The aortic valve is tricuspid. Aortic valve regurgitation is not visualized. FINDINGS  Left Ventricle: Left ventricular ejection fraction, by estimation, is 40 to 45%. The left ventricle has mildly decreased function. The left ventricle demonstrates global hypokinesis. Definity contrast agent was given IV to delineate the left ventricular  endocardial borders. The left ventricular internal cavity size was normal in size. There is moderate left ventricular hypertrophy. Right Ventricle: The right ventricular size is normal. No increase in right ventricular wall thickness. Right ventricular systolic function is normal. There is moderately elevated pulmonary artery systolic pressure. The tricuspid regurgitant velocity is 3.24 m/s, and with an assumed right atrial pressure of 15 mmHg, the estimated right ventricular systolic pressure is 57.0 mmHg. Left Atrium: Left atrial size was mildly dilated. Spontaneous echo contrast was present. No left atrial/left atrial appendage thrombus was detected. The LAA emptying velocity was 30 cm/s. Right Atrium: Right atrial size was normal in size. Pericardium: Trivial pericardial effusion is present. The pericardial effusion is posterior to the  left ventricle. Mitral Valve: The mitral valve is grossly normal. Trivial mitral valve regurgitation. Tricuspid Valve: The tricuspid valve is grossly normal. Tricuspid valve regurgitation is moderate. Aortic Valve: The aortic valve is tricuspid. Aortic valve regurgitation is not visualized. Pulmonic Valve: The pulmonic valve was normal in structure. Pulmonic valve regurgitation is not visualized. Aorta: The aortic root and ascending aorta are structurally normal, with no evidence of dilitation. IAS/Shunts: No atrial level shunt detected by color flow Doppler.  TRICUSPID VALVE TR Peak grad:   42.0 mmHg TR Vmax:        324.00 cm/s MD Electronically signed by Zoila Shutter MD Signature Date/Time: 07/31/2019/12:06:20 PM    Final     Cardiac Studies   Echo 07/27/19:  1. Trivial pericardial effusion is present. The pericardial effusion is  circumferential.  2. Left ventricular ejection fraction, by estimation, is 40 to 45%. The  left ventricle has mildly decreased function. The left ventricle  demonstrates global hypokinesis. There is moderate left ventricular  hypertrophy. Left ventricular diastolic  parameters are indeterminate.  3. Right ventricular systolic function is normal. The right ventricular  size is normal. There is moderately elevated pulmonary artery systolic  pressure. The estimated right ventricular systolic pressure is 59.1 mmHg.  4. The mitral valve is normal in structure. Trivial mitral valve  regurgitation. No evidence of mitral stenosis.  5. Tricuspid valve regurgitation is moderate.  6. The aortic valve is tricuspid. Aortic valve regurgitation is not  visualized. Mild aortic valve sclerosis is present, with no evidence of  aortic valve stenosis.  7. The inferior vena cava is normal in size with greater than 50%  respiratory variability, suggesting right atrial pressure of 3 mmHg.   Echo 02/23/11:  Study Conclusions   - Left ventricle: Wall thickness was  increased in a pattern  of mild LVH. Systolic function was normal. The estimated  ejection fraction was in the range of 55% to 60%. Wall  motion was normal; there were no regional wall motion  abnormalities. Features are consistent with a pseudonormal  left ventricular filling pattern, with concomitant  abnormal relaxation and increased filling pressure (grade  2 diastolic dysfunction). Doppler parameters are  consistent with elevated mean left atrial filling  pressure.  - Mitral valve: Calcified annulus. Mildly thickened leaflets  .  - Left atrium: LA midly dilated by volumetric measurements  - Pericardium, extracardiac: A small pericardial effusion  was identified.   Patient Profile     69 y.o. female  with a hx of pericardial effusion without tamponade but requiring drainage, hypertension, hyperlipidemia, uncontrolled diabetes, and bipolar disorder admitted with new onset atrial fibrillation with RVR.  Assessment & Plan    # Paroxysmal atrial fibrillation:  # SSS:   Ms. Conteh is doing well and maintaining sinus rhythm after cardioversion on 7/20.  I am concerned that she may have sleep apnea and she will need a outpatient sleep study.  Given her reduced systolic function we will stop diltiazem.  Consolidate metoprolol to succinate.  Continue Eliquis.  It is likely that her cardiomyopathy is rate related and would improve with rate control.   We were unable to further titrate her nodal agents due to bradycardia and pauses.   OSA may have been contributing to this as well, as most of the episodes were overnight.  # Acute systolic and diastolic heart failure:  # Essential hypertension: LVEF 40-45% down from 55% in 2013.  She is euvolemic.  It is likely rate related as above.  We will consolidate metoprolol to succinate.  Start Liscomb today.  She does not have any ischemic symptoms.  If her LVEF does not improve with rate control she will need an outpatient  ischemia evaluation at a later date.  [facility] will sign off.   Medication Recommendations: Starting Entresto.   Other recommendations (labs, testing, etc): We will need a basic metabolic panel at follow-up on 8/6.  Outpatient sleep study.  Repeat echo in 3 months.  Consider ischemia evaluation if LVEF remains reduced  follow up as an outpatient: Scheduled 08/17/2019   For questions or updates, please contact [facility] Please consult [email or web address] for contact info under        Signed, Chilton Si, MD  08/01/2019, 8:31 AM

## 2019-08-01 NOTE — Telephone Encounter (Signed)
A TOC follow up appointment has been scheduled for 08/17/19 at 8:45 AM with Tereso Newcomer per Judy Pimple.

## 2019-08-01 NOTE — Progress Notes (Signed)
Family Medicine Teaching Service Daily Progress Note Intern Pager: [phone]  Patient name: [name] [name] Medical record number: [id number] Date of birth: 11-08-1950 Age: 69 y.o. Gender: female  Primary Care Provider: Dana Allan, MD Consultants: Cardiology  Code Status: Full   Pt Overview and Major Events to Date:  Admitted 7/15 7/20 TTE, cardioversion (successful)  Assessment and Plan: [name] [name] is a 69 year old female who presented with palpitations and weakness, found to have new onset A. fib with RVR.  S/p cardioversion 7/20.  PMH significant for HTN, uncontrolled DM, history of pericardial effusion, mixed hyperlipidemia.  New onset atrial fibrillationwith JYN:WGNFA, improving. After cardioversion, heart rate 76-86, BP systolic 115-151, diastolic 74-104.  SPO2 96-100%.  Cards recommended adding ARB after CV. -Stopped diltiazem yesterday -Continue Eliquis 5 mg twice daily -Continue metoprolol 50 mg -Start Entresto 24-26 mg this morning -Follow-up heart rate and blood pressure: Systolic 160s and 170s, diastolics 80s and 90s, pulse 74-88 -Recommend outpatient sleep study to rule out OSA  Hypertension:Chronic, poorly controlled. Post cardioversion vitals as above. -Continue metoprolol as above -Start Entresto as above -Continue to monitor  Type 2 Diabetes:Chronic, poorly controlled. Admission A1c greater than 15.  FS glucose this morning 112. -Continue moderate sliding scale coverage and Lantus 25 units subcu nightly -We will send home with insulin -Recommend restarting home semaglutide outpatient with PCP  Hyperlipidemia:Chronic, stable. H/O mixed hyperlipidemia.  Not taking prescribed atorvastatin at home. -Continue Lipitor 40 mg daily -Follow-up with PCP outpatient  H/o SmallPericardial effusion History of pericardial effusion in 2013. Lost to follow-up. Echo showed  trivial circumferential pericardial effusion, LVEF 40 to 45%, LV global hypokinesis, moderately elevated pulmonary artery systolic pressure, moderate tricuspid regurgitation. -We will need outpatient cardiology follow-up   FEN/GI: Modified carb diet PPx: Eliquis 5 mg twice daily  Disposition: CV progressive  Subjective:  [name] [name] was ambulating up on the side of her bed, getting dressed.  She has no complaints for [place] this morning, and saying she is doing really well with getting up and ambulating in the room.  She has so far tolerated the Entresto.  We discussed that we would see how she tolerates this medicine before sending her home, with possible discharge this afternoon.  She was amenable to that plan.  Objective: Temp:  [97.7 F (36.5 C)-98.3 F (36.8 C)] 97.7 F (36.5 C) (07/21 1116) Pulse Rate:  [73-129] 81 (07/21 1330) Resp:  [16-24] 19 (07/21 1330) BP: (139-179)/(65-104) 160/83 (07/21 1330) SpO2:  [86 %-100 %] 97 % (07/21 1330) Weight:  [80.5 kg] 80.5 kg (07/21 0430)  Physical Exam: General: Awake, alert, oriented Cardiovascular: Regular rate and rhythm, no murmurs auscultated Respiratory: Lungs clear to auscultation bilaterally Extremities: No BLE edema  Laboratory: Recent Labs  Lab 07/30/19 0401 07/31/19 0255 08/01/19 0225  WBC 6.3 6.7 7.7  HGB 11.4* 11.6* 11.6*  HCT 37.3 37.7 37.3  PLT 210 220 210   Recent Labs  Lab 07/27/19 0449 07/27/19 0449 07/28/19 0349 07/29/19 0227 07/30/19 0401 07/31/19 0255 08/01/19 0225  NA 137   < > 136   < > 138 140 139  K 3.7   < > 3.7   < > 4.0 4.2 4.1  CL 101   < > 104   < > 107 107 105  CO2 24   < > 23   < > 25 26 25   BUN 8   < > 13   < > 11 10 10   CREATININE 0.71   < > 0.69   < >  0.77 0.70 0.63  CALCIUM 8.2*   < > 8.2*   < > 8.5* 9.0 9.1  PROT 6.6  --  6.1*  --   --   --   --   BILITOT 0.4  --  0.6  --   --   --   --   ALKPHOS 94  --  83  --   --   --   --   ALT 66*  --  51*  --   --   --   --   AST 28  --   24  --   --   --   --   GLUCOSE 330*   < > 205*   < > 152* 167* 167*   < > = values in this interval not displayed.    Imaging/Diagnostic Tests: No results found.   Fayette Pho, MD 08/01/2019, 3:02 PM PGY-1, [facility] Family Medicine FPTS Intern pager: [phone], text pages welcome

## 2019-08-01 NOTE — Plan of Care (Signed)
  Problem: Education: Goal: Knowledge of disease or condition will improve Outcome: Progressing Goal: Understanding of medication regimen will improve Outcome: Progressing  Problem: Activity: Goal: Ability to tolerate increased activity will improve Outcome: Progressing   Problem: Cardiac: Goal: Ability to achieve and maintain adequate cardiopulmonary perfusion will improve Outcome: Progressing   Problem: Education: Goal: Knowledge of General Education information will improve Description: Including pain rating scale, medication(s)/side effects and non-pharmacologic comfort measures Outcome: Progressing   Problem: Clinical Measurements: Goal: Ability to maintain clinical measurements within normal limits will improve Outcome: Progressing

## 2019-08-02 NOTE — Telephone Encounter (Signed)
**Note De-Identified  Obfuscation** Patient contacted regarding discharge from [facility] on 08/01/2019.  Patient understands to follow up with provider Tereso Newcomer, PA-c on 08/17/2019 at 8:45 at [address] in [place], [place] [address]. Patient understands discharge instructions? Yes Patient understands medications and regiment? Yes Patient understands to bring all medications to this visit? Yes  Ask patient:  Are you enrolled in My Chart: No and she states she does not use the Internet.

## 2019-08-03 ENCOUNTER — Other Ambulatory Visit: Payer: Self-pay

## 2019-08-03 ENCOUNTER — Ambulatory Visit (INDEPENDENT_AMBULATORY_CARE_PROVIDER_SITE_OTHER): Payer: Medicare Other | Admitting: Family Medicine

## 2019-08-03 VITALS — BP 160/80 | HR 84 | Ht 63.0 in | Wt 174.6 lb

## 2019-08-03 DIAGNOSIS — E1165 Type 2 diabetes mellitus with hyperglycemia: Secondary | ICD-10-CM

## 2019-08-03 DIAGNOSIS — I471 Supraventricular tachycardia: Secondary | ICD-10-CM | POA: Diagnosis not present

## 2019-08-03 DIAGNOSIS — I5041 Acute combined systolic (congestive) and diastolic (congestive) heart failure: Secondary | ICD-10-CM | POA: Diagnosis not present

## 2019-08-03 DIAGNOSIS — I10 Essential (primary) hypertension: Secondary | ICD-10-CM

## 2019-08-03 MED ORDER — FREESTYLE LIBRE 2 SENSOR MISC
1.0000 | 11 refills | Status: DC
Start: 2019-08-03 — End: 2019-09-13

## 2019-08-03 MED ORDER — EMPAGLIFLOZIN 10 MG PO TABS: 10.0000 mg | ORAL_TABLET | Freq: Every day | ORAL | 3 refills | Status: DC

## 2019-08-03 NOTE — Progress Notes (Signed)
    SUBJECTIVE:   CHIEF COMPLAINT / HPI:   Diabetes Mellitus  Eats 2-3 meals a day.  Denies missing any doses of DM meds. Pt was started on insulin in the hospital. Denies increased thirst, hunger, or frequent urination.   Had a continuous glucometer placed in the hospital. Reports low CGB of 60 and high of 204 and her monitor alerted her. States she regularly takes Ozempic on Tuesdays. Reports her doctor stopped Metformin a while ago.   Atrial Flutter  Biventricular heart failure Had to be cardioverted during her admission. Denies chest pain, palpitations, shortness of breath. Takes Eliquis as prescribed. Has not had any recent falls, hematuria, nosebleeds or bloody or black stools.  HTN Takes Entresto and Metoprolol. Denies missing doses of antihypertensive medications. Denies chest pain, palpitations, lower extremity edema, exertional dyspnea, lightheadedness, headaches and vision changes.  Home BP Monitoring - BP 160s-210s systolic         PERTINENT  PMH / PSH: A. flutter/fibrillation, systolic and diastolic heart failure, hypertension  OBJECTIVE:   BP (!) 160/80   Pulse 84   Ht 5\' 3"  (1.6 m)   Wt 174 lb 9.6 oz (79.2 kg)   SpO2 97%   BMI 30.93 kg/m    GEN: pleasant older female, in no acute distress  CV: regular rate and rhythm, no murmurs appreciated  RESP: no increased work of breathing, clear to ascultation bilaterally  MSK: no lower extremity edema SKIN: warm, dry NEURO: grossly normal, moves all extremities appropriately PSYCH: Normal affect, appropriate speech and behavior    ASSESSMENT/PLAN:   HTN (hypertension) Uncontrolled. BP not at goal. Continue current medications.  Medications: Metoprolol, Entresto  Cardiovascular risk factors: advanced age (older than 60 for men, 33 for women), diabetes mellitus, dyslipidemia, hypertension and obesity (BMI >= 30 kg/m2).  History of target organ damage: angina/ prior myocardial infarction and heart  failure.  Preventitive Healthcare: Exercise: states she is active with her grandkids  Salt Restriction: "tries"   Atrial tachycardia (HCC) Regular rate and rhythm on exam.  Has had no bleeding events.  Continue taking Eliquis.  Cardiac follow-up with [facility] on August 17, 2019.  Acute combined systolic and diastolic heart failure (HCC) Has follow-up with Mr. August 19, 2019, NP at [facility] on August 17, 2019.  Defer treatment to her cardiology team. -Continue Entresto as prescribed  Uncontrolled type 2 diabetes mellitus with hyperglycemia (HCC) Uncontrolled.  A1c at recent admission >15. Reports her continuous monitor went off when it off when her CBG was 60 and again when it was 204.  Will follow with Dr. August 19, 2019 on 08/09/2019 to change to monitor and receive additional education.  -Continue Lantus 25 units -Start Jardiance 10 mg  -Follow-up in 3 months      08/11/2019, DO [facility]

## 2019-08-03 NOTE — Assessment & Plan Note (Addendum)
Uncontrolled. BP not at goal. Continue current medications.  Medications: Metoprolol, Entresto  Cardiovascular risk factors: advanced age (older than 52 for men, 55 for women), diabetes mellitus, dyslipidemia, hypertension and obesity (BMI >= 30 kg/m2).  History of target organ damage: angina/ prior myocardial infarction and heart failure.  Preventitive Healthcare: Exercise: states she is active with her grandkids  Salt Restriction: "tries"

## 2019-08-03 NOTE — Patient Instructions (Addendum)
It was great meeting you today!  Please check-out at the front desk before leaving the clinic. I'd like to see you back in 3 months but if you need to be seen earlier than that for any new issues we're happy to fit you in, just give [place] a call!  Visit Remembers: - Stop by the pharmacy to pick up your new prescription - Continue to work on your healthy eating habits and incorporating exercise into your daily life.  - Your goal is to have an BP < 140/90 - Medicine Changes: Start Jardiance (Empagliflozin)  - To Do: continue to monitor your blood sugar with the continuous reader   Regarding lab work today:  Due to recent changes in healthcare laws, you may see the results of your imaging and laboratory studies on MyChart before your provider has had a chance to review them.  I understand that in some cases there may be results that are confusing or concerning to you. Not all laboratory results come back in the same time frame and you may be waiting for multiple results in order to interpret others.  Please give [place] 72 hours in order for your provider to thoroughly review all the results before contacting the office for clarification of your results. If everything is normal, you will get a letter in the mail or a message in My Chart. Please give [place] a call if you do not hear from [place] after 2 weeks.  Please bring all of your medications with you to each visit.    If you haven't already, sign up for My Chart to have easy access to your labs results, and communication with your primary care physician.  Feel free to call with any questions or concerns at any time, at [phone].   Take care,  Dr. Katherina [facility]

## 2019-08-03 NOTE — Progress Notes (Signed)
Asked by Dr. Rachael Darby to meet and review CGM technique with patient and daughter who will assist with application.    Patient was provided LIbreView2 during hospitalization.  She reports she has MedicareA/B and Medicaid.  She also reports using the reader with her CGM device.  She does NOT have a smart phone.  Patient educated on purpose, proper use and potential adverse effects of Libre CGM.  Following instruction patient verbalized understanding of treatment plan.  Also instructed to purchase skin tac and Tegaderm.   Patient informed that we will attempt to send prescription for Libre CGM.  If it is NOT covered we will need to transition to Dexcom CGM in 1 week when sensor requires replacement.    I have asked patient to return in 1 week for either [facility] sensor replacement OR education and initiation of DEXCOM which is more likely covered by [facility] Medicaid.  New Rx for [place] sensors sent to pharmacy. ([facility] - [place]).   Patient did report 1 low nocturnal reading with CGM - No adjustment in insulin regimen made at this appointment (patient did not have reader with her).

## 2019-08-03 NOTE — Discharge Summary (Addendum)
Family Medicine Teaching [facility] Discharge Summary  Patient name: [name] [name] Medical record number: [id number] Date of birth: Aug 11, 1950 Age: 69 y.o. Gender: female Date of Admission: 07/26/2019  Date of Discharge: 7/23 Admitting Physician: Reece Leader, DO  Primary Care Provider: Dana Allan, MD Consultants: Cardiology  Indication for Hospitalization: New onset A. fib with RVR, uncontrolled diabetes mellitus  Discharge Diagnoses/Problem List:  New onset A. fib with RVR Uncontrolled diabetes type 2 Hypertension Hyperlipidemia History of small pericardial effusion  Disposition: Home  Discharge Condition: Stable  Discharge Exam:  Temp:  [97.7 F (36.5 C)-98.3 F (36.8 C)] 97.7 F (36.5 C) (07/21 1116) Pulse Rate:  [73-129] 81 (07/21 1330) Resp:  [16-24] 19 (07/21 1330) BP: (139-179)/(65-104) 160/83 (07/21 1330) SpO2:  [86 %-100 %] 97 % (07/21 1330) Weight:  [80.5 kg] 80.5 kg (07/21 0430)  Physical Exam: General: Awake, alert, oriented Cardiovascular: Regular rate and rhythm, no murmurs auscultated Respiratory: Lungs clear to auscultation bilaterally Extremities: No BLE edema  Brief Hospital Course:   New onset A.fib with RVR s/p cardioversion Hypertensive and tachycardic upon admission, BP 181/132, heart rate 160s to 170s.  Otherwise she was hemodynamically stable and well-appearing.  In ED, started on heparin GTT and diltiazem IV.  Troponin flat 20 > 18.  TSH WNL.  CXR negative. Was admitted to the cardiac telemetry floor and her heart rate responded somewhat to metoprolol + dilt, but she remained in A. fib at 120 bpm despite max diltiazem drip. Cardiology was consulted and performed TEE w/o thrombus and successful cardioversion 7/20.  She stayed in NSR for the remainder of her hospitalization. Echo EF was noted to be 40-45%, however suspected this may been related to uncontrolled rhythm.  She was discharged on metoprolol 50 mg, Entresto 24-26 mg, and Eliquis  5 mg twice daily. Did report sleep apnea symptoms (loud snoring, fatigue), recommend outpatient sleep study to rule out OSA.  DM type 2 Admission A1c >15.  Prescribed semaglutide at home, but unsure of compliance. Blood sugars normalized somewhat throughout admission with Lantus 25 units daily and moderate sliding scale insulin.  FS glucose upon discharge ranged 112-222.  She was sent home with insulin as above and restarted on Lipitor for primary prevention.  At outpatient follow-up, could consider adding SGLT2 for improved HF benefits.  Hypertension Hypertensive upon admission, BP 181/132.  Above treatment for new onset A. fib improved this somewhat, but BP remained elevated.  Last day of hospitalization, BP systolic 151-179, diastolic 81-99.  Metoprolol was increased stepwise, discharged on Entresto 24-26 mg and metoprolol 50 mg.    Issues for Follow Up:  1. Follow-up with outpatient cardiology: adjust metoprolol, Entresto, Eliquis as necessary; follow-up small pericardial effusion. Appt scheduled 8/6.  2. Please obtain BMP on follow up due to recent estresto start.  3. Follow-up BP and HR, titrate medications as appropriate.  4. Recommend outpatient sleep study to rule out OSA 5. Type 2 diabetes: Follow-up A1c in 3 months.  Sent home with insulin.  Follow-up appointment, consider starting SGLT2.  6. Monitor for medication compliance, was not taking any of her home medications on admit.   Significant Procedures: TEE and cardioversion (7/20)  Significant Labs and Imaging:  Recent Labs  Lab 07/30/19 0401 07/31/19 0255 08/01/19 0225  WBC 6.3 6.7 7.7  HGB 11.4* 11.6* 11.6*  HCT 37.3 37.7 37.3  PLT 210 220 210   Recent Labs  Lab 07/28/19 0349 07/28/19 0349 07/29/19 0227 07/29/19 0227 07/30/19 0401 07/30/19 0401 07/31/19 0255  08/01/19 0225  NA 136  --  136  --  138  --  140 139  K 3.7   < > 4.1   < > 4.0   < > 4.2 4.1  CL 104  --  108  --  107  --  107 105  CO2 23  --  20*  --   25  --  26 25  GLUCOSE 205*  --  141*  --  152*  --  167* 167*  BUN 13  --  11  --  11  --  10 10  CREATININE 0.69  --  0.63  --  0.77  --  0.70 0.63  CALCIUM 8.2*  --  8.5*  --  8.5*  --  9.0 9.1  MG  --   --  1.5*  --   --   --   --  1.4*  ALKPHOS 83  --   --   --   --   --   --   --   AST 24  --   --   --   --   --   --   --   ALT 51*  --   --   --   --   --   --   --   ALBUMIN 2.6*  --   --   --   --   --   --   --    < > = values in this interval not displayed.    PORTABLE CHEST 1 VIEW COMPARISON:  09/11/2013 FINDINGS: Mild cardiomegaly. No focal airspace consolidation, pleural effusion, or pneumothorax. Osseous structures are within normal limits. IMPRESSION: No active disease.  Transesophageal Echo, Cardiac Doppler, Color Doppler and  Intracardiac Opacification Agent PROCEDURE: After discussion of the risks and benefits of a TEE, an  informed consent was obtained from the patient. The transesophogeal probe  was passed without difficulty through the esophogus of the patient. Imaged  were obtained with the patient in a  supine position. Sedation performed by different physician. The patient  was monitored while under deep sedation. Anesthestetic sedation was  provided intravenously by Anesthesiology: 185mg  of Propofol. Image quality  was good. The patient's vital signs;  including heart rate, blood pressure, and oxygen saturation; remained  stable throughout the procedure. The patient developed no complications  during the procedure. A successful direct current cardioversion was  performed at 120 joules with 1 attempt.   IMPRESSIONS  1. Left ventricular ejection fraction, by estimation, is 40 to 45%. The  left ventricle has mildly decreased function. The left ventricle  demonstrates global hypokinesis. There is moderate left ventricular  hypertrophy.  2. Right ventricular systolic function is normal. The right ventricular  size is normal. There is moderately elevated  pulmonary artery systolic  pressure. The estimated right ventricular systolic pressure is 57.0 mmHg.  3. Left atrial size was mildly dilated. No left atrial/left atrial  appendage thrombus was detected. The LAA emptying velocity was 30 cm/s.  4. The mitral valve is grossly normal. Trivial mitral valve  regurgitation.  5. Tricuspid valve regurgitation is moderate.  6. The aortic valve is tricuspid. Aortic valve regurgitation is not  visualized.   FINDINGS  Left Ventricle: Left ventricular ejection fraction, by estimation, is 40  to 45%. The left ventricle has mildly decreased function. The left  ventricle demonstrates global hypokinesis. Definity contrast agent was  given IV to delineate the left ventricular  endocardial borders. The  left ventricular internal cavity size was normal  in size. There is moderate left ventricular hypertrophy.   Right Ventricle: The right ventricular size is normal. No increase in  right ventricular wall thickness. Right ventricular systolic function is  normal. There is moderately elevated pulmonary artery systolic pressure.  The tricuspid regurgitant velocity is  3.24 m/s, and with an assumed right atrial pressure of 15 mmHg, the  estimated right ventricular systolic pressure is 57.0 mmHg.   Left Atrium: Left atrial size was mildly dilated. Spontaneous echo  contrast was present. No left atrial/left atrial appendage thrombus was  detected. The LAA emptying velocity was 30 cm/s.   Right Atrium: Right atrial size was normal in size.   Pericardium: Trivial pericardial effusion is present. The pericardial  effusion is posterior to the left ventricle.   Mitral Valve: The mitral valve is grossly normal. Trivial mitral valve  regurgitation.   Tricuspid Valve: The tricuspid valve is grossly normal. Tricuspid valve  regurgitation is moderate.   Aortic Valve: The aortic valve is tricuspid. Aortic valve regurgitation is  not visualized.   Pulmonic  Valve: The pulmonic valve was normal in structure. Pulmonic valve  regurgitation is not visualized.   Aorta: The aortic root and ascending aorta are structurally normal, with  no evidence of dilitation.   IAS/Shunts: No atrial level shunt detected by color flow Doppler.     TRICUSPID VALVE  TR Peak grad:  42.0 mmHg  TR Vmax:    324.00 cm/s   Zoila Shutter MD  Electronically signed by Zoila Shutter MD  Signature Date/Time: 07/31/2019/12:06:20 PM   Results/Tests Pending at Time of Discharge: None  Discharge Medications:  Allergies as of 08/01/2019      Reactions   Fruit & Vegetable Daily [nutritional Supplements] Swelling, Other (See Comments)   All fruits cause tongue swelling and numbness   Other Swelling   Tree nuts cause tongue swelling and numbness   Peanut-containing Drug Products Swelling, Other (See Comments)   Tongue swelling and numbness   Aspirin Nausea And Vomiting, Other (See Comments)   Cannot take due to liver issues   Tylenol [acetaminophen] Other (See Comments)   Cannot take due to liver issues      Medication List    STOP taking these medications   olmesartan 5 MG tablet Commonly known as: BENICAR     TAKE these medications   accu-chek soft touch lancets Once daily testing.   apixaban 5 MG Tabs tablet Commonly known as: ELIQUIS Take 1 tablet (5 mg total) by mouth 2 (two) times daily.   atorvastatin 40 MG tablet Commonly known as: LIPITOR Take 1 tablet (40 mg total) by mouth daily.   glucose blood test strip Commonly known as: Accu-Chek Aviva Use as instructed   insulin glargine 100 UNIT/ML Solostar Pen Commonly known as: LANTUS Inject 25 Units into the skin at bedtime.   metoprolol succinate 100 MG 24 hr tablet Commonly known as: TOPROL-XL Take 1 tablet (100 mg total) by mouth daily. Take with or immediately following a meal.   OZEMPIC (0.25 OR 0.5 MG/DOSE) [place] Inject 0.25 mg into the skin once a week.   PEN NEEDLES 31GX5/16" 31G  X 8 MM Misc 1 each by Does not apply route at bedtime. Use to inject Lantus Solostart insulin pen   sacubitril-valsartan 24-26 MG Commonly known as: ENTRESTO Take 1 tablet by mouth 2 (two) times daily.     ASK your doctor about these medications   FreeStyle Libre 2 Reader  Devi 1 Device by Does not apply route once for 1 dose. Ask about: Should I take this medication?   FreeStyle Libre 2 Sensor Misc 1 Device by Does not apply route once for 1 dose. Ask about: Should I take this medication?       Discharge Instructions: Please refer to Patient Instructions section of EMR for full details.  Patient was counseled important signs and symptoms that should prompt return to medical care, changes in medications, dietary instructions, activity restrictions, and follow up appointments.   Follow-Up Appointments:  Follow-up Information    Tereso Newcomer T, PA-C Follow up on 08/17/2019.   Specialties: Cardiology, Physician Assistant Why: Please arrive 15 minutes early for your post-hospital cardiology follow-up appointment Contact information: [address] [place] [address] [phone]        [facility]. Go on 08/03/2019.   Why: Hospital follow up appt @8 :30am Contact information: [address] [place] [address] [phone]              Fayette Pho, MD 08/03/2019, 5:38 PM PGY-1, [facility]  FPTS Upper-Level Resident Addendum   My edits for correction/addition/clarification are added. Please see also any attending notes.    Allayne Stack, DO  Family Medicine PGY-3

## 2019-08-03 NOTE — Assessment & Plan Note (Signed)
Has follow-up with Mr. Wende Mott, NP at [facility] heart care on August 17, 2019.  Defer treatment to her cardiology team. -Continue Entresto as prescribed

## 2019-08-03 NOTE — Assessment & Plan Note (Signed)
Regular rate and rhythm on exam.  Has had no bleeding events.  Continue taking Eliquis.  Cardiac follow-up with [facility] heart care on August 17, 2019.

## 2019-08-03 NOTE — Assessment & Plan Note (Signed)
Uncontrolled.  A1c at recent admission >15. Reports her continuous monitor went off when it off when her CBG was 60 and again when it was 204.  Will follow with Dr. Raymondo Band on 08/09/2019 to change to monitor and receive additional education.  -Continue Lantus 25 units -Start Jardiance 10 mg  -Follow-up in 3 months

## 2019-08-09 ENCOUNTER — Ambulatory Visit (INDEPENDENT_AMBULATORY_CARE_PROVIDER_SITE_OTHER): Payer: Medicare Other | Admitting: Pharmacist

## 2019-08-09 ENCOUNTER — Other Ambulatory Visit: Payer: Self-pay

## 2019-08-09 VITALS — BP 159/91 | HR 84

## 2019-08-09 DIAGNOSIS — E1165 Type 2 diabetes mellitus with hyperglycemia: Secondary | ICD-10-CM | POA: Diagnosis not present

## 2019-08-09 MED ORDER — INSULIN GLARGINE 100 UNIT/ML SOLOSTAR PEN: 18.0000 [IU] | PEN_INJECTOR | Freq: Every day | SUBCUTANEOUS | 2 refills | Status: DC

## 2019-08-09 NOTE — Progress Notes (Signed)
S:    Chief Complaint  Patient presents with  . Medication Management    Libreview f/u placement   Patient presents today for diabetes management and Freestyle Libre sensor application. Patient was referred by Dr. Volanda Napoleon. Patient last sen by Dr. Susa Simmonds on 08/03/19 in which patient was started on Jardiance 10 mg once daily and continue on Lantus 25 units daily.    PMH significant for HTN, HLD, DM, Afib (on Eliquis), CHF  Patient reports Diabetes was diagnosed approximately 20 years ago.    Patient presents today in good spirits with her daughter and her husband.   Reports that sugars go up to 200's every night. Sugars are normally in 100s during the day (confirmed by AGP report). Patient states that she was woken up by Elenor Legato at 2AM with a low of 60 four days ago, but denied symptoms. Going to [place] on August 11th - will need an extra sensor. Following up with her pharmacy to assess PA status, but will provide sensor before trip if not resolved by then.  Working with pharmacy/insurance to get coverage for Vania Rea (she does not currently take this).    Insurance coverage/medication affordability: UHC Medicare and [place] Medicaid  Family/Social History: non-contributory  Patient-reported BG readings: 110, 125 Patient reports hypoglycemic event - experienced a low of 60 x1 (CGM notified patient). Denies hypoglycemic symptoms  Patient reports adherence with medications.  Current diabetes medications include: Lantus 25 units daily, Ozempic 0.25 mg once weekly, Jardiance 10 mg daily (not taking yet) Previously tried diabetes medications: Metformin (intolerance due to GI effects)  Patient reported dietary habits: vegetable soup  Patient-reported exercise habits: enjoys walking but has been limited due to high heart rate. Encouraged her to restart her exercise routine.   Patient denies nocturia (nighttime urination).  Patient denies neuropathy (nerve pain). Patient denies visual  changes.  Freestyle Libre 2.0 patient education Patient taking >500 mg Vitamin C: denies  Reminded patient to not take Vitamin C with Freestyle Libre.  Instruction: [employer] 14-day Freestyle Libre patient education  CGM overview and set-up 1. Button, touch screen, and icons 2. Power supply and recharging 3. Home screen 4. Date and time 5. Set BG target range: 80-250 mg/dL 6. Set alarm/alert tone  7. Interstitial vs. capillary blood glucose readings  8. When to verify sensor reading with fingerstick blood glucose  Sensor application -- sensor placed on back of right arm on 08/09/19 1. Site selection and site prep with alcohol pad/Skin Tac 2. Sensor prep-sensor pack and sensor applicator 3. Starting the sensor: 1 hour warm up before BG readings available     Will ask for fingersticks the first 12 hours   4. Sensor change every 14 days and rotate site 5. Call [employer] customer service if sensor comes off before 14 days  Safety and Troubleshooting 1. Scan the sensor at least every 8 hours 2. When the "test BG" symbol appears, test fingerstick blood glucose prior to    making treatment decisions 3. Do a fingerstick blood glucose test if the sensor readings do not match how    you feel 4. Remove sensor prior for MRI or CT. Sensor may be damaged by exposure to    airport x-ray screening 5. Vitamin C may cause false high readings and aspirin may cause false low     readings 6. Store sensor kit between 39 and 77 degrees. Can be refrigerated at this temp.  Contact information provided for [employer] customer service and/or trainer.  O:  Labs:   Vitals:   08/09/19 1411  BP: (!) 159/91  Pulse: 84  SpO2: 94%   Lab Results  Component Value Date   HGBA1C >15.0 07/26/2019   HGBA1C 9.4 (A) 12/30/2017   HGBA1C 8.8 (A) 09/06/2017   No results found for: CPEPTIDE    Component Value Date/Time   CHOL 156 07/28/2019 0349   CHOL 132 06/08/2017 1110   TRIG 116 07/28/2019 0349   HDL 32 (L)  07/28/2019 0349   HDL 35 (L) 06/08/2017 1110   CHOLHDL 4.9 07/28/2019 0349   VLDL 23 07/28/2019 0349   LDLCALC 101 (H) 07/28/2019 0349   LDLCALC 67 06/08/2017 1110   Lab Results  Component Value Date   MICRALBCREAT 30-300 09/06/2017    AGP Report 07/27/2019 - 08/09/2019  Glucose Management Indicator (GMI) = 7.4% Time in Ranges:  High (181-250 mg/dL) Target Range (70-180 mg/dL) Low (54-69 mg/dL)  35% 62% 0%    Assessment: Diabetes: Patient's longstanding diabetes (x14yr) uncontrolled likely due to previous medication non-adherence (self D/C of metformin in Jan 2021), A1c >15 at recent admission, and fluctuating blood sugars (60-211). Freestyle Libre 2.0 CGM placed on patient's back of right arm successfully 08/09/19.   Plan: 1. Increase Ozempic 0.5 mg once weekly on Tuesdays  2. Decrease Lantus to 18 units (from 25 units) once daily. 3. Anticipate starting Jardiance 10 mg daily pending insurance 4. Continue to use Freestyle Libre 2.0 CGM 5. Extensively discussed pathophysiology of diabetes, recommended lifestyle interventions, dietary effects on blood sugar control 6. Counseled on s/sx of and management of hypoglycemia   Hypertension: Patient's longstanding hypertension (dx 2012) currently uncontrolled on Entresto (sacubitril/valsartan) and Toprol XL (metoprolol succinate).    Plan: 1. Continue monitoring BP at home 2. If BP uncontrolled at next visit, consider spironolactone initiation.  Follow-up appointment in 3 weeks (after trip)  to review sugar readings and replace CGM sensor. Written patient instructions provided.  This appointment required 45 minutes of patient care (this includes precharting, chart review, review of results, face-to-face care, etc.).  Thank you for involving pharmacy to assist in providing this patient's care.  Patient seen by ELaurey Arrow PharmD Candidate and ILorel Monaco PharmD, PGY2 Pharmacy Resident.    Follow-up with [employer] RE insurance refusal  and possible coverage.  Call in #: [phone] # for patient: [id number] 7/28 Planned call back 8/3  If supply is not available for this patient - plan to supply 1 additional sensor prior to [place]

## 2019-08-09 NOTE — Patient Instructions (Addendum)
It was a pleasure seeing you in clinic today!  Medication Changes: Begin taking Lantus 18 units daily   Begin taking Ozempic 0.5 mg once weekly   Start taking Atorvastatin (cholesterol medication) in the evening (PM)  Continue using your LibreView. Please refer below for some reminders  Please remember... 1. Sensor/transmitter will last 14 days 2. Sensor should be applied to area away from waistband, scarring, tattoos, irritation, and bones. 3. Transmitter must be within 4-5 feet of receiver 4. Do a fingerstick blood glucose test if the sensor readings do not match how you feel 5. Remove sensor prior to magnetic resonance imaging (MRI), computed tomography (CT) scan, or high-frequency electrical heat (diathermy) treatment. 6. Freestyle Libre may be worn through a Environmental education officer. It may not be exposed to an advanced Imaging Technology (AIT) body scanner (also called a millimeter wave scanner) or the baggage x-ray machine. Instead, ask for hand-wanding or full-body pat-down and visual inspection.  7. Doses of vitamin C (>500 mg) may cause false high readings. 8. Store sensor kit between 36 and 86 degrees Farenheit. Can be refrigerated within this temperature range.  Problems with Freestyle sticking? 1. Order Skin Tac from [facility]. Alcohol swab area you plan to administer Freestyle Libre then let dry. Once dry, apply Skin Tac in a circular motion (with a spot in the middle for sensor without skin tac) and let dry. Once dry you can apply Freestyle [place]!   Problems taking off Freestyle Libre? 1. Remember to try to shower/bathe before removing Freestyle Libre 2. Order Tac Away from [facility] to help remove any extra adhesive left on your skin once you remove Freestyle Libre  Problems with irritation? 1. Before applying Freestyle Libre: Apply Nasacort nasal spray (can buy over the counter) to area you will apply Freestyle Libre --> alcohol swab --> Skin Tac --> Freestyle  Libre 2. After applying Freestyle Libre: Apply hydrocortisone cream to area until redness resolves   [employer] 1. Customer Sales Support (Freestyle orders and general customer questions)       Phone number: [phone]       Monday - Sunday  8 AM - 8 PM PST 2.   [email or web address]

## 2019-08-10 ENCOUNTER — Encounter: Payer: Self-pay | Admitting: Pharmacist

## 2019-08-10 NOTE — Assessment & Plan Note (Signed)
Diabetes: Patient's longstanding diabetes (x11yrs) uncontrolled likely due to previous medication non-adherence (self D/C of metformin in Jan 2021), A1c >15 at recent admission, and fluctuating blood sugars (60-211). Freestyle Libre 2.0 CGM placed on patient's back of right arm successfully 08/09/19.   Plan: 1. Increase Ozempic 0.5 mg once weekly on Tuesdays  2. Decrease Lantus to 18 units (from 25 units) once daily. 3. Anticipate starting Jardiance 10 mg daily pending insurance 4. Continue to use Freestyle Libre 2.0 CGM 5. Extensively discussed pathophysiology of diabetes, recommended lifestyle interventions, dietary effects on blood sugar control 6. Counseled on s/sx of and management of hypoglycemia

## 2019-08-13 NOTE — Progress Notes (Signed)
Reviewed: I agree with Dr. Koval's documentation and management. 

## 2019-08-16 NOTE — Progress Notes (Signed)
Cardiology Office Note:    Date:  08/17/2019   ID:  CLARK CLOWDUS, DOB 08-Jan-1951, MRN [id number]  PCP:  Dana Allan, MD  Cardiologist:  Kristeen Miss, MD  Electrophysiologist:  None   Referring MD: Dana Allan, MD   Chief Complaint:  Hospitalization Follow-up (Atrial fibrillation with rapid ventricular rate)    Patient Profile:    [name] [name] is a 69 y.o. female with:   Paroxysmal atrial fibrillation   admx with AF w/ RVR 7/21 >> s/p TEE-DCCV  Sick sinus syndrome  Bradycardia and 1.7 sec pauses noted - AV nodal blockers could not be further titrated  Chronic systolic and diastolic CHF  Dilated CM - ? Tachycardia mediated   Pulmonary hypertension -Echo 7/21: RVSP 59.1  Hx of pericardial effusion  In setting of admission w/ hepatic abscess  No tamponade; required pericardiocentesis   Hypertension   Hyperlipidemia   Diabetes mellitus   Bipolar d/o  Prior CV studies: TEE 07/31/2019 EF 40-45, RVSP 57, mild LAE, moderate TR  Echocardiogram 07/27/2019 EF 40-45, moderate LVH, normal RVSF, RVSP 59.1, trivial MR, moderate TR  History of Present Illness:    Ms. Sorenson was admitted 7/15-7/23 with new onset AFib with RVR.  Her HR was difficult to control.  However, her AV nodal blocking agents could not be increased further due to episodes of bradycardia and short pauses (1.7 sec).  An echocardiogram demonstrated reduced LV function with EF 40-45.  this was thought to be tachycardia mediated.  She underwent TEE-DCCV w/ restoration of normal sinus rhythm and improved symptoms.  She will need a repeat echocardiogram at some point in the next 8-12 weeks.  If her EF does not return to normal, she will need an ischemic evaluation.  She will need an OP sleep study as she likely has OSA contributing to her AFib.      She returns for follow up.   She is here with her daughter.  She has been feeling well without chest discomfort, significant shortness of breath,  orthopnea, leg swelling or syncope.  She has been tolerating her medications well.  Past Medical History:  Diagnosis Date  . Abdominal pain   . Back pain   . Constipation   . Fatigue   . Full dentures   . Hemorrhoids   . Hypertension   . Lack of adequate sleep   . Liver mass   . Rash    on right breast  . Wears glasses     Current Medications: Current Meds  Medication Sig  . apixaban (ELIQUIS) 5 MG TABS tablet Take 1 tablet (5 mg total) by mouth 2 (two) times daily.  Marland Kitchen atorvastatin (LIPITOR) 40 MG tablet Take 1 tablet (40 mg total) by mouth daily.  . Continuous Blood Gluc Sensor (FREESTYLE LIBRE 2 SENSOR) MISC Inject 1 Device into the skin every 14 (fourteen) days.  Marland Kitchen docusate sodium (COLACE) 100 MG capsule Take 100 mg by mouth daily.  Marland Kitchen glucose blood (ACCU-CHEK AVIVA) test strip Use as instructed  . insulin glargine (LANTUS) 100 UNIT/ML Solostar Pen Inject 18 Units into the skin at bedtime.  . Insulin Pen Needle (PEN NEEDLES 31GX5/16") 31G X 8 MM MISC 1 each by Does not apply route at bedtime. Use to inject Lantus Solostart insulin pen  . Lancets (ACCU-CHEK SOFT TOUCH) lancets Once daily testing.  . metoprolol succinate (TOPROL-XL) 100 MG 24 hr tablet Take 1 tablet (100 mg total) by mouth daily. Take with or immediately following a meal.  .  Semaglutide (OZEMPIC, 0.25 OR 0.5 MG/DOSE, Vazquez) Inject 0.5 mg into the skin once a week.  . [DISCONTINUED] apixaban (ELIQUIS) 5 MG TABS tablet Take 1 tablet (5 mg total) by mouth 2 (two) times daily.  . [DISCONTINUED] metoprolol succinate (TOPROL-XL) 100 MG 24 hr tablet Take 1 tablet (100 mg total) by mouth daily. Take with or immediately following a meal.  . [DISCONTINUED] sacubitril-valsartan (ENTRESTO) 24-26 MG Take 1 tablet by mouth 2 (two) times daily.     Allergies:   Fruit & vegetable daily [nutritional supplements], Other, Peanut-containing drug products, Aspirin, and Tylenol [acetaminophen]   Social History   Tobacco Use  .  Smoking status: Never Smoker  . Smokeless tobacco: Never Used  Vaping Use  . Vaping Use: Never used  Substance Use Topics  . Alcohol use: No    Alcohol/week: 0.0 standard drinks  . Drug use: No     Family Hx: The patient's family history includes Alcohol abuse in an other family member; Diabetes in her sister, sister and another family member; Hypertension in her sister; Multiple sclerosis in her sister; Other in her father; Stroke in her mother. There is no history of Breast cancer.  Review of Systems  Gastrointestinal: Negative for hematochezia and melena.  Genitourinary: Negative for hematuria.     EKGs/Labs/Other Test Reviewed:    EKG:  EKG is   ordered today.  The ekg ordered today demonstrates normal sinus rhythm, heart rate 76, normal axis, nonspecific ST-T wave changes, QTC 434  Recent Labs: 07/26/2019: TSH 2.320 07/28/2019: ALT 51 08/01/2019: BUN 10; Creatinine, Ser 0.63; Hemoglobin 11.6; Magnesium 1.4; Platelets 210; Potassium 4.1; Sodium 139   Recent Lipid Panel Lab Results  Component Value Date/Time   CHOL 156 07/28/2019 03:49 AM   CHOL 132 06/08/2017 11:10 AM   TRIG 116 07/28/2019 03:49 AM   HDL 32 (L) 07/28/2019 03:49 AM   HDL 35 (L) 06/08/2017 11:10 AM   CHOLHDL 4.9 07/28/2019 03:49 AM   LDLCALC 101 (H) 07/28/2019 03:49 AM   LDLCALC 67 06/08/2017 11:10 AM    Physical Exam:    VS:  BP (!) 178/80   Pulse 76   Ht 5\' 3"  (1.6 m)   Wt 175 lb 3.2 oz (79.5 kg)   SpO2 97%   BMI 31.04 kg/m     Wt Readings from Last 3 Encounters:  08/17/19 175 lb 3.2 oz (79.5 kg)  08/03/19 174 lb 9.6 oz (79.2 kg)  08/01/19 177 lb 7.5 oz (80.5 kg)     Constitutional:      Appearance: Healthy appearance. Not in distress.  Neck:     Vascular: JVD normal.  Pulmonary:     Effort: Pulmonary effort is normal.     Breath sounds: No wheezing. No rales.  Cardiovascular:     Normal rate. Regular rhythm. Normal S1. Normal S2.     Murmurs: There is no murmur.  Edema:     Peripheral edema absent.  Abdominal:     Palpations: Abdomen is soft. There is no hepatomegaly.  Skin:    General: Skin is warm and dry.  Neurological:     Mental Status: Alert and oriented to person, place and time.     Cranial Nerves: Cranial nerves are intact.      ASSESSMENT & PLAN:    1. PAF (paroxysmal atrial fibrillation) (HCC) 2. SSS (sick sinus syndrome) (HCC) Status post recent admission for atrial fibrillation with rapid ventricular rate.  Titration of her AV nodal blocking agents was  hindered by evidence of bradycardia at times as well as insignificant pauses.  She underwent TEE cardioversion and is currently maintaining sinus rhythm.  CHA2DS2-VASc=5 (diab, HTN, female, age x 1, CHF).  She is tolerating anticoagulation.  Continue apixaban 5 mg twice daily.  Continue metoprolol succinate 100 mg daily.  3. Chronic combined systolic and diastolic CHF (congestive heart failure) (HCC) 4. Dilated cardiomyopathy (HCC) Her cardiomyopathy is likely related to tachycardia.  EF 40-45%.  NYHA II.  Volume status appears stable.  Continue beta-blocker, Entresto.  Blood pressure is uncontrolled and I will adjust Entresto as outlined below.  If her blood pressure remains uncontrolled, we could consider adding spironolactone.  Obtain follow-up BMET today.  Repeat BMET in 2 weeks.  5. Essential hypertension Blood pressure above target.  Continue current dose of metoprolol succinate.  Increase Entresto to 49/51 mg twice daily.  Obtain BMET in 2 weeks.  Consider spironolactone if blood pressure remains uncontrolled.  6. Snoring She has history of snoring as well as atrial fibrillation.  Arrange home sleep study to rule sleep apnea.  7.  Hypomagnesemia Start magnesium oxide 4 mg daily.  Obtain follow-up magnesium level in 2 weeks.    Dispo:  Return in about 4 weeks (around 09/14/2019) for Routine Follow Up, w/ Tereso Newcomer, PA-C, in person.   Medication Adjustments/Labs and Tests  Ordered: Current medicines are reviewed at length with the patient today.  Concerns regarding medicines are outlined above.  Tests Ordered: Orders Placed This Encounter  Procedures  . Basic metabolic panel  . Magnesium  . Basic metabolic panel  . EKG 12-Lead  . Home sleep test   Medication Changes: Meds ordered this encounter  Medications  . apixaban (ELIQUIS) 5 MG TABS tablet    Sig: Take 1 tablet (5 mg total) by mouth 2 (two) times daily.    Dispense:  60 tablet    Refill:  6  . Magnesium Oxide 400 MG CAPS    Sig: Take 1 capsule (400 mg total) by mouth daily.    Dispense:  30 capsule    Refill:  6  . metoprolol succinate (TOPROL-XL) 100 MG 24 hr tablet    Sig: Take 1 tablet (100 mg total) by mouth daily. Take with or immediately following a meal.    Dispense:  30 tablet    Refill:  6  . sacubitril-valsartan (ENTRESTO) 49-51 MG    Sig: Take 1 tablet by mouth 2 (two) times daily.    Dispense:  60 tablet    Refill:  6    Signed, Tereso Newcomer, PA-C  08/17/2019 9:53 AM    [facility] [address] [place], [place], [place]  [address] Phone: [phone]; Fax: [phone]

## 2019-08-17 ENCOUNTER — Ambulatory Visit (INDEPENDENT_AMBULATORY_CARE_PROVIDER_SITE_OTHER): Payer: Medicare Other | Admitting: Physician Assistant

## 2019-08-17 ENCOUNTER — Encounter: Payer: Self-pay | Admitting: Pharmacist

## 2019-08-17 ENCOUNTER — Encounter: Payer: Self-pay | Admitting: Physician Assistant

## 2019-08-17 ENCOUNTER — Other Ambulatory Visit: Payer: Self-pay

## 2019-08-17 VITALS — BP 178/80 | HR 76 | Ht 63.0 in | Wt 175.2 lb

## 2019-08-17 DIAGNOSIS — I495 Sick sinus syndrome: Secondary | ICD-10-CM | POA: Diagnosis not present

## 2019-08-17 DIAGNOSIS — R0683 Snoring: Secondary | ICD-10-CM

## 2019-08-17 DIAGNOSIS — I5042 Chronic combined systolic (congestive) and diastolic (congestive) heart failure: Secondary | ICD-10-CM

## 2019-08-17 DIAGNOSIS — I10 Essential (primary) hypertension: Secondary | ICD-10-CM

## 2019-08-17 DIAGNOSIS — I480 Paroxysmal atrial fibrillation: Secondary | ICD-10-CM | POA: Diagnosis not present

## 2019-08-17 DIAGNOSIS — I420 Dilated cardiomyopathy: Secondary | ICD-10-CM | POA: Diagnosis not present

## 2019-08-17 LAB — BASIC METABOLIC PANEL
BUN/Creatinine Ratio: 14 (ref 12–28)
BUN: 11 mg/dL (ref 8–27)
CO2: 26 mmol/L (ref 20–29)
Calcium: 9.4 mg/dL (ref 8.7–10.3)
Chloride: 102 mmol/L (ref 96–106)
Creatinine, Ser: 0.77 mg/dL (ref 0.57–1.00)
GFR calc Af Amer: 92 mL/min/{1.73_m2} (ref >59)
GFR calc non Af Amer: 80 mL/min/{1.73_m2} (ref >59)
Glucose: 131 mg/dL — ABNORMAL HIGH (ref 65–99)
Potassium: 4.1 mmol/L (ref 3.5–5.2)
Sodium: 141 mmol/L (ref 134–144)

## 2019-08-17 MED ORDER — APIXABAN 5 MG PO TABS: 5.0000 mg | ORAL_TABLET | Freq: Two times a day (BID) | ORAL | 6 refills | Status: DC

## 2019-08-17 MED ORDER — MAGNESIUM OXIDE 400 MG PO CAPS: 1.0000 | ORAL_CAPSULE | Freq: Every day | ORAL | 6 refills | Status: DC

## 2019-08-17 MED ORDER — METOPROLOL SUCCINATE ER 100 MG PO TB24: 100.0000 mg | ORAL_TABLET | Freq: Every day | ORAL | 6 refills | Status: DC

## 2019-08-17 MED ORDER — ENTRESTO 49-51 MG PO TABS: 1.0000 | ORAL_TABLET | Freq: Two times a day (BID) | ORAL | 6 refills | Status: DC

## 2019-08-17 NOTE — Patient Instructions (Signed)
Medication Instructions:  Your physician has recommended you make the following change in your medication:   1) Increase Entresto to 49-51 mg, 1 tablet by mouth twice a day; you may take two of your 24-26 mg tablets twice a day until you run out 2) Start Magnesium 400 mg, 1 capsule by mouth once a day  Lab Work: You will have labs drawn today: BMET  Your physician recommends that you return for lab work in 2 weeks on 08/31/19, you do not need to be fasting for this appointment.  **You may come to the lab anytime between 7:30AM-4:30PM on 08/31/19**  Testing/Procedures: None ordered today  Follow-Up: On 09/19/19 at 9:45AM with Tereso Newcomer, PA-C

## 2019-08-17 NOTE — Progress Notes (Signed)
Patient arrives to clinic.    She has been having issues with insurance coverage of [employer] for her [employer].  She reports that she called the insurance company and they reported that they would be sending her supplies (3 months of sensors) in the next 14 days.   She is planning to depart for vacation in < 7 days and requested Libre sensors for use during her time away.  Her control appears to be excellent with some late evening elevations.  Upon medication review we determined her dosing was Lantus 18 units at bedtime.  We agreed to try a trial of Lantus 18 units in the AM.  I anticipate this will minimize the evening high readings as the AM dosing will move the trough of the Lantus to early morning 3-6 AM rather than 6-9 PM as was happening with QHS dosing.   Two sensors were provided.  I asked her to follow-up upon her return.  I will plan to call her in 3 weeks.

## 2019-08-20 ENCOUNTER — Other Ambulatory Visit: Payer: Self-pay | Admitting: *Deleted

## 2019-08-20 ENCOUNTER — Telehealth: Payer: Self-pay

## 2019-08-20 ENCOUNTER — Telehealth: Payer: Self-pay | Admitting: *Deleted

## 2019-08-20 MED ORDER — OZEMPIC (0.25 OR 0.5 MG/DOSE) 2 MG/1.5ML ~~LOC~~ SOPN
0.5000 mg | PEN_INJECTOR | SUBCUTANEOUS | 2 refills | Status: DC
Start: 2019-08-20 — End: 2019-11-14

## 2019-08-20 NOTE — Telephone Encounter (Signed)
Received detailed written order for CGM from [facility]. Dr. Raymondo Band brought signed form by Dr. Leveda Anna to nurse triage room for processing. Faxed signed form back to provided fax number. Will place form in green CGM folder in nurse office.   Veronda Prude, RN

## 2019-08-20 NOTE — Telephone Encounter (Signed)
Entered in error, should be refill request. [name] [name], CMA

## 2019-08-31 ENCOUNTER — Other Ambulatory Visit: Payer: Medicare Other | Admitting: *Deleted

## 2019-08-31 ENCOUNTER — Other Ambulatory Visit: Payer: Self-pay

## 2019-08-31 DIAGNOSIS — I420 Dilated cardiomyopathy: Secondary | ICD-10-CM | POA: Diagnosis not present

## 2019-08-31 DIAGNOSIS — I10 Essential (primary) hypertension: Secondary | ICD-10-CM | POA: Diagnosis not present

## 2019-08-31 DIAGNOSIS — I480 Paroxysmal atrial fibrillation: Secondary | ICD-10-CM

## 2019-08-31 DIAGNOSIS — I5042 Chronic combined systolic (congestive) and diastolic (congestive) heart failure: Secondary | ICD-10-CM

## 2019-08-31 LAB — BASIC METABOLIC PANEL
BUN/Creatinine Ratio: 17 (ref 12–28)
BUN: 13 mg/dL (ref 8–27)
CO2: 28 mmol/L (ref 20–29)
Calcium: 9.7 mg/dL (ref 8.7–10.3)
Chloride: 102 mmol/L (ref 96–106)
Creatinine, Ser: 0.77 mg/dL (ref 0.57–1.00)
GFR calc Af Amer: 92 mL/min/{1.73_m2} (ref >59)
GFR calc non Af Amer: 80 mL/min/{1.73_m2} (ref >59)
Glucose: 100 mg/dL — ABNORMAL HIGH (ref 65–99)
Potassium: 4.4 mmol/L (ref 3.5–5.2)
Sodium: 142 mmol/L (ref 134–144)

## 2019-08-31 LAB — MAGNESIUM: Magnesium: 1.7 mg/dL (ref 1.6–2.3)

## 2019-09-06 ENCOUNTER — Telehealth: Payer: Self-pay | Admitting: Pharmacist

## 2019-09-06 NOTE — Telephone Encounter (Signed)
Noted and agree. 

## 2019-09-06 NOTE — Telephone Encounter (Signed)
Contacted patient to follow-up on blood glucose and CGM supplies.   Patient states she had a wonderful trip to [place], her blood sugars have been excellent with majority of readings in the low 100s.  She reports lowest reading was 80, denied any symptoms) and feels like the CGM has improved her control.   She has NOT yet received her supply of Dexcom sensors from the mail order pharmacy.  She placed the last sensor we provided yesterday (she has 9 more days).  We both understood that her insurance is covering her CGM supplies and they were being sent through mail order pharmacy (anticipate delivery any day).   I will plan to call her in 1 week to ensure that she has received her CGM supplies.  No change in medication therapy today. She verbalized appreciation of follow-up call.

## 2019-09-10 ENCOUNTER — Encounter (HOSPITAL_COMMUNITY): Payer: Self-pay | Admitting: Internal Medicine

## 2019-09-13 ENCOUNTER — Telehealth: Payer: Self-pay | Admitting: Pharmacist

## 2019-09-13 MED ORDER — FREESTYLE LIBRE 2 SENSOR MISC: 1.0000 | 11 refills | Status: DC

## 2019-09-13 NOTE — Telephone Encounter (Signed)
Reviewed and agree.

## 2019-09-13 NOTE — Telephone Encounter (Signed)
Contacted patient RE CGM [employer] supply.   Patient reports she has NOT yet received any Libre sensors through the mail.  She requested a new prescription sent to her local pharmacy.

## 2019-09-18 NOTE — Progress Notes (Signed)
Cardiology Office Note:    Date:  09/19/2019   ID:  [name], DOB 11/18/1950, MRN [id number]  PCP:  Dana Allan, MD  Cardiologist:  Kristeen Miss, MD  Electrophysiologist:  None   Referring MD: Dana Allan, MD   Chief Complaint:  Follow-up (CHF, AFib, HTN)    Patient Profile:    [name] [name] is a 69 y.o. female with:   Paroxysmal atrial fibrillation   admx with AF w/ RVR 7/21 >> s/p TEE-DCCV  Sick sinus syndrome  Bradycardia and 1.7 sec pauses noted - AV nodal blockers could not be further titrated  Chronic systolic and diastolic CHF  Dilated CM - ? Tachycardia mediated   Echo 7/21: EF 40-45  Pulmonary hypertension -Echo 7/21: RVSP 59.1  Hx of pericardial effusion  In setting of admission w/ hepatic abscess  No tamponade; required pericardiocentesis   Hypertension   Hyperlipidemia   Diabetes mellitus   Bipolar d/o  Prior CV studies: TEE 07/31/2019 EF 40-45, RVSP 57, mild LAE, moderate TR  Echocardiogram 07/27/2019 EF 40-45, moderate LVH, normal RVSF, RVSP 59.1, trivial MR, moderate TR  History of Present Illness:    [name] [name] was admitted in 7/21 with new onset AFib with RVR with associated dilated CM (EF 40-45).  Her HR was difficult to control, but AV nodal blocking agents could not be increased further due to episodes of bradycardia and short pauses (1.7 sec).  She underwent TEE-DCCV w/ restoration of normal sinus rhythm and improved symptoms.  She was last seen in 8/21 for post hospital follow up.  I increased her Entresto with plans to start Spironolactone at follow up if her BP will allow.  She will need a follow up echocardiogram to recheck EF once she is on maximal GDMT.  She returns for follow up.  She is here alone today.  She went to [place] after her last visit here and had a good time. She has not had chest pain, shortness of breath, syncope, leg swelling, orthopnea.  She really does not check her BP at home that much. Her daughters  help her with her medications.  She is not sure if she is taking Eliquis or Entresto but is certain she is only taking one twice daily med.  Of note, she played a video game on her phone during most of the interview today.     Past Medical History:  Diagnosis Date  . Abdominal pain   . Back pain   . Constipation   . Fatigue   . Full dentures   . Hemorrhoids   . Hypertension   . Lack of adequate sleep   . Liver mass   . Rash    on right breast  . Wears glasses     Current Medications: Current Meds  Medication Sig  . Continuous Blood Gluc Sensor (FREESTYLE LIBRE 2 SENSOR) MISC Inject 1 Device into the skin every 14 (fourteen) days.  Marland Kitchen docusate sodium (COLACE) 100 MG capsule Take 100 mg by mouth daily.  Marland Kitchen glucose blood (ACCU-CHEK AVIVA) test strip Use as instructed  . insulin glargine (LANTUS) 100 UNIT/ML Solostar Pen Inject 18 Units into the skin at bedtime.  . Insulin Pen Needle (PEN NEEDLES 31GX5/16") 31G X 8 MM MISC 1 each by Does not apply route at bedtime. Use to inject Lantus Solostart insulin pen  . Lancets (ACCU-CHEK SOFT TOUCH) lancets Once daily testing.  . Magnesium Oxide 400 MG CAPS Take 1 capsule (400 mg total) by mouth daily.  Marland Kitchen  metoprolol succinate (TOPROL-XL) 100 MG 24 hr tablet Take 1 tablet (100 mg total) by mouth daily. Take with or immediately following a meal.  . sacubitril-valsartan (ENTRESTO) 49-51 MG Take 1 tablet by mouth 2 (two) times daily.  . Semaglutide,0.25 or 0.5MG /DOS, (OZEMPIC, 0.25 OR 0.5 MG/DOSE,) 2 MG/1.5ML SOPN Inject 0.375 mLs (0.5 mg total) into the skin once a week.     Allergies:   Fruit & vegetable daily [nutritional supplements], Other, Peanut-containing drug products, Aspirin, and Tylenol [acetaminophen]   Social History   Tobacco Use  . Smoking status: Never Smoker  . Smokeless tobacco: Never Used  Vaping Use  . Vaping Use: Never used  Substance Use Topics  . Alcohol use: No    Alcohol/week: 0.0 standard drinks  . Drug use: No       Family Hx: The patient's family history includes Alcohol abuse in an other family member; Diabetes in her sister, sister and another family member; Hypertension in her sister; Multiple sclerosis in her sister; Other in her father; Stroke in her mother. There is no history of Breast cancer.  Review of Systems  Respiratory: Negative for hemoptysis.   Gastrointestinal: Negative for hematochezia and melena.  Genitourinary: Negative for hematuria.      EKGs/Labs/Other Test Reviewed:    EKG:  EKG is    ordered today.  The ekg ordered today demonstrates normal sinus rhythm, HR 84, normal axis, non-specific ST-TW changes, QTc 465 ms, no changes  Recent Labs: 07/26/2019: TSH 2.320 07/28/2019: ALT 51 08/01/2019: Hemoglobin 11.6; Platelets 210 08/31/2019: BUN 13; Creatinine, Ser 0.77; Magnesium 1.7; Potassium 4.4; Sodium 142   Recent Lipid Panel Lab Results  Component Value Date/Time   CHOL 156 07/28/2019 03:49 AM   CHOL 132 06/08/2017 11:10 AM   TRIG 116 07/28/2019 03:49 AM   HDL 32 (L) 07/28/2019 03:49 AM   HDL 35 (L) 06/08/2017 11:10 AM   CHOLHDL 4.9 07/28/2019 03:49 AM   LDLCALC 101 (H) 07/28/2019 03:49 AM   LDLCALC 67 06/08/2017 11:10 AM    Physical Exam:    VS:  BP (!) 160/100   Ht 5\' 3"  (1.6 m)   Wt 176 lb (79.8 kg)   SpO2 98%   BMI 31.18 kg/m     Wt Readings from Last 3 Encounters:  09/19/19 176 lb (79.8 kg)  08/17/19 175 lb 3.2 oz (79.5 kg)  08/03/19 174 lb 9.6 oz (79.2 kg)     Constitutional:      Appearance: Healthy appearance. Not in distress.  Neck:     Thyroid: No thyromegaly.     Vascular: JVD normal.  Pulmonary:     Effort: Pulmonary effort is normal.     Breath sounds: No wheezing. No rales.  Cardiovascular:     Normal rate. Regular rhythm. Normal S1. Normal S2.     Murmurs: There is no murmur.  Edema:    Peripheral edema absent.  Abdominal:     Palpations: Abdomen is soft.  Skin:    General: Skin is warm and dry.  Neurological:     Mental  Status: Alert and oriented to person, place and time.     Cranial Nerves: Cranial nerves are intact.       ASSESSMENT & PLAN:    1. Essential hypertension Her blood pressure is out of control.  It is not clear what medication she is taking.  She does not have her medications with her today.  She is supposed to be on metoprolol succinate 100 mg  and Entresto 49/51 mg.  Before adding another medication, I would like to confirm her medications from home.  I have asked her to contact us today to give [place] the exact list of what she is taking.  If she is no longer on Entresto, I will resume it at 49/51 daily.  If she is taking metoprolol and Entresto, I will add hydralazine 25 mg 3 times a day.  I will see her back in close follow-up in the next 2 weeks.  I have asked that she monitor her blood pressure and bring a list of her blood pressure readings in for review.  Addendum: The patient did call back and noted that she was no longer taking Entresto.  She is concerned about side effects.  There is some question as to whether or not she is taking metoprolol.  We have asked her to resume metoprolol if she has stopped it.  She will remain off of Entresto and I will place her on valsartan 160 mg daily.  She will need a BMET in 2 weeks.  When I see her back in 2 weeks, I will try to place her on spironolactone.  2. PAF (paroxysmal atrial fibrillation) (HCC) Status post TEE guided cardioversion in July 2021.  She is maintaining sinus rhythm.  It is not clear if she is still taking Apixaban.  As noted above, I have asked her to review her medications when she is home.  She is not taking Apixaban, I will resume this.  As noted, close follow-up in 2 weeks.  3. Chronic combined systolic and diastolic CHF (congestive heart failure) (HCC) 4. Dilated cardiomyopathy (HCC) Likely tachycardia mediated cardiomyopathy.  We will repeat an echocardiogram once daily is on maximal guideline directed medical therapy.  At this  point, she should be on metoprolol and Entresto.  I will consider adding Entresto at some point once we know more about her medications.  With how uncontrolled her blood pressure is, I will likely place her on hydralazine if she is truly taking metoprolol and Entresto.  5. Snoring Home sleep study has been ordered.   Dispo:  Return in about 2 weeks (around 10/03/2019) for Close Follow Up, w/ Tereso Newcomer, PA-C, in person.   Medication Adjustments/Labs and Tests Ordered: Current medicines are reviewed at length with the patient today.  Concerns regarding medicines are outlined above.  Tests Ordered: Orders Placed This Encounter  Procedures  . EKG 12-Lead   Medication Changes: No orders of the defined types were placed in this encounter.   Signed, Tereso Newcomer, PA-C  09/19/2019 10:30 AM    [facility] [address] [place], [place], [place]  [address] Phone: [phone]; Fax: [phone]

## 2019-09-19 ENCOUNTER — Ambulatory Visit (INDEPENDENT_AMBULATORY_CARE_PROVIDER_SITE_OTHER): Payer: Medicare Other | Admitting: Physician Assistant

## 2019-09-19 ENCOUNTER — Other Ambulatory Visit: Payer: Self-pay

## 2019-09-19 ENCOUNTER — Encounter: Payer: Self-pay | Admitting: Physician Assistant

## 2019-09-19 ENCOUNTER — Telehealth: Payer: Self-pay | Admitting: Physician Assistant

## 2019-09-19 ENCOUNTER — Telehealth: Payer: Self-pay

## 2019-09-19 VITALS — BP 160/100 | Ht 63.0 in | Wt 176.0 lb

## 2019-09-19 DIAGNOSIS — I420 Dilated cardiomyopathy: Secondary | ICD-10-CM

## 2019-09-19 DIAGNOSIS — R0683 Snoring: Secondary | ICD-10-CM | POA: Diagnosis not present

## 2019-09-19 DIAGNOSIS — I5042 Chronic combined systolic (congestive) and diastolic (congestive) heart failure: Secondary | ICD-10-CM | POA: Diagnosis not present

## 2019-09-19 DIAGNOSIS — I480 Paroxysmal atrial fibrillation: Secondary | ICD-10-CM

## 2019-09-19 DIAGNOSIS — I10 Essential (primary) hypertension: Secondary | ICD-10-CM

## 2019-09-19 NOTE — Telephone Encounter (Signed)
Her ejection fraction is low.  Normal is 55-65% and hers was 40-45% based upon echocardiogram done in the hospital. Sherryll Burger is an excellent drug that would provide much benefit for her as well as control her blood pressure.  Review of her chart indicates that she told her primary care that she was also not taking metoprolol.  PLAN:  1. Start Valsartan 160 mg once daily  2. BMET 2 weeks 3. If not taking Metoprolol succinate 100 mg once daily, resume taking it.   4. Keep f/u with me 9/22 5. Monitor BP at home.  Call if >170/110. 6. Low sodium diet Tereso Newcomer, PA-C    09/19/2019 1:47 PM

## 2019-09-19 NOTE — Telephone Encounter (Signed)
Please call patient to make an appointment to discuss medications.  Looks like she is supposed to be taking Metoprolol, Entresto and Eliquis.  Have her bring in all her medications to the appointment.  Thank you Dana Allan, MD [facility]

## 2019-09-19 NOTE — Telephone Encounter (Signed)
Patient calls nurse line with questions regarding her blood pressure medications. Looks like patient saw her cardiologist today, however there was confusion on what she was actually taking. Patient reports to me she has not been taking [name] or Metoprolol as prescribed by cards. I advised patient to give them a call to confirm her current bp meds. Patient reports she was told to call us. Will forward to PCP to see as there is some confusion.

## 2019-09-19 NOTE — Telephone Encounter (Signed)
Patient states she was told to call to clarify the medications she is taking. She states she has not been taking entresto.

## 2019-09-19 NOTE — Telephone Encounter (Signed)
I returned call to patient, she states that she is taking Eliquis 5 mg twice a day and Metoprolol. She is not taking Entresto, she states that she does not have heart failure. Patient states that she does not want to take [facility] because she has looked it up and does not like all the side effects listed and the fact that it can affect the liver. Patient states that she had a cyst on her liver and does not want to take any medication to damage her liver.

## 2019-09-19 NOTE — Patient Instructions (Addendum)
Medication Instructions:  Review the medication list. When you get home today, review your medications and compare it with this list and call us back. These are the heart medications that you should be taking (our office will refill):  Apixaban (Eliquis) 5 mg twice daily - This is a blood thinner to prevent you from having a stroke. Very Important.  Metoprolol succinate (Toprol-XL) 100 mg once daily - This is a blood pressure medication that lowers your heart rate.  Sacubitril-Valsartan (Entresto) 49/51 mg twice daily - This is a heart failure medication and blood pressure medication.  Follow-Up: At [facility], you and your health needs are our priority.  As part of our continuing mission to provide you with exceptional heart care, we have created designated Provider Care Teams.  These Care Teams include your primary Cardiologist (physician) and Advanced Practice Providers (APPs -  Physician Assistants and Nurse Practitioners) who all work together to provide you with the care you need, when you need it.  We recommend signing up for the patient portal called "MyChart".  Sign up information is provided on this After Visit Summary.  MyChart is used to connect with patients for Virtual Visits (Telemedicine).  Patients are able to view lab/test results, encounter notes, upcoming appointments, etc.  Non-urgent messages can be sent to your provider as well.   To learn more about what you can do with MyChart, go to [email or web address].    Your next appointment:   2 week(s) on 10/03/19 at 2:15PM  The format for your next appointment:   In Person  Provider:   Tereso Newcomer, PA-C    Other Instructions Check your blood pressure once daily and write it down.  Bring the list of BP readings to your next appt.

## 2019-09-20 MED ORDER — VALSARTAN 160 MG PO TABS
160.0000 mg | ORAL_TABLET | Freq: Every day | ORAL | 1 refills | Status: DC
Start: 2019-09-20 — End: 2019-09-28

## 2019-09-20 NOTE — Telephone Encounter (Signed)
I called and spoke with patient, she wants to research Valsartan before starting this medication. She wants to look up the side effects. She asked me to send prescription in and she will look into the medication and decided if she wants to take it

## 2019-09-20 NOTE — Telephone Encounter (Signed)
Patient scheduled for 9/17. Will bring meds. Sunday Spillers, CMA

## 2019-09-21 NOTE — Telephone Encounter (Signed)
Her BP is pretty high. I recommend not delaying treatment to prevent complications such as stroke, MI, etc. Tereso Newcomer, PA-C    09/21/2019 10:15 AM

## 2019-09-24 NOTE — Telephone Encounter (Signed)
Patient states that she looked up Valsartan and it had too many side effects for her, so she will discuss her medications with her PCP on 09/28/19 and bring updated list to appointment on 10/03/19 when she comes back to see you.

## 2019-09-24 NOTE — Telephone Encounter (Signed)
Her BP was uncontrolled at her recent appt. She is at risk of stroke and heart attack acutely if her BP remains uncontrolled.  She should see her PCP this week to help manage her BP. If she is willing, we could get her in to see our PharmD in the HTN clinic to help manage her medications. Tereso Newcomer, PA-C    09/24/2019 5:00 PM

## 2019-09-25 ENCOUNTER — Telehealth: Payer: Self-pay

## 2019-09-25 DIAGNOSIS — E1165 Type 2 diabetes mellitus with hyperglycemia: Secondary | ICD-10-CM

## 2019-09-25 NOTE — Telephone Encounter (Signed)
I received a freestyle libre form for patient. I contacted the pharmacy and her insurance will not pay for this. Since she has medicare I can probably get the Dexcom approved via [employer]. If this is an option let me know. Will forward to provider who left paper.

## 2019-09-25 NOTE — Telephone Encounter (Signed)
Patient aware and will discuss with PCP on 09/28/19

## 2019-09-28 ENCOUNTER — Other Ambulatory Visit: Payer: Self-pay

## 2019-09-28 ENCOUNTER — Ambulatory Visit (INDEPENDENT_AMBULATORY_CARE_PROVIDER_SITE_OTHER): Payer: Medicare Other | Admitting: Family Medicine

## 2019-09-28 ENCOUNTER — Encounter: Payer: Self-pay | Admitting: Family Medicine

## 2019-09-28 ENCOUNTER — Other Ambulatory Visit: Payer: Self-pay | Admitting: Family Medicine

## 2019-09-28 VITALS — BP 162/110 | HR 91 | Ht 63.0 in | Wt 176.8 lb

## 2019-09-28 DIAGNOSIS — Z23 Encounter for immunization: Secondary | ICD-10-CM | POA: Diagnosis not present

## 2019-09-28 DIAGNOSIS — Z0000 Encounter for general adult medical examination without abnormal findings: Secondary | ICD-10-CM | POA: Diagnosis not present

## 2019-09-28 DIAGNOSIS — I10 Essential (primary) hypertension: Secondary | ICD-10-CM | POA: Diagnosis not present

## 2019-09-28 DIAGNOSIS — Z1239 Encounter for other screening for malignant neoplasm of breast: Secondary | ICD-10-CM

## 2019-09-28 MED ORDER — BLOOD PRESSURE KIT: PACK | 0 refills | Status: DC

## 2019-09-28 MED ORDER — ENALAPRIL MALEATE 5 MG PO TABS: 5.0000 mg | ORAL_TABLET | Freq: Every day | ORAL | 0 refills | Status: DC

## 2019-09-28 NOTE — Progress Notes (Signed)
    SUBJECTIVE:   CHIEF COMPLAINT / HPI: discuss BP meds  Patient reports that she "does not have heart failure" and does not want to continue taking Entresto (has not taken this since discharge from hospital) or Diovan secondary to possible damage to liver.  Was recently seen by Cards who recommended she take Entresto and Metoprolol.  She is willing to take "straight BP meds"  BP at home 160's.  Denies any chest pain, SOB, visual disturbances or headaches.    PERTINENT  PMH / PSH:  HTN DM Type 2 Afib on Eliquis  OBJECTIVE:   BP (!) 162/110   Pulse 91   Ht 5\' 3"  (1.6 m)   Wt 176 lb 12.8 oz (80.2 kg)   SpO2 96%   BMI 31.32 kg/m    General: Alert and oriented, no apparent distress  Cardiovascular: RRR with no murmurs noted Respiratory: CTA bilaterally  Gastrointestinal: Bowel sounds present. No abdominal pain     ASSESSMENT/PLAN:   HTN (hypertension) Elevated BP today.  She does not want to take Entreesto or Diovan -Continue Metoprolol 100 mg daily -Start Enalapril 5 mg qhs -Monitor BP at home and will review at next visit -Recommend patient follow up with Cards for medication management -Follow up in 4 weeks, will need Bmet and A1c at that time  Healthcare maintenance Mammogram ordered today Flu vaccine today PNA vaccine not given today, patient will decide for next visit Reports Colonoscopy done about 5 years ago, no record in chart. Will readdress at next visit     01-[birth year]-1993, MD [facility]

## 2019-09-28 NOTE — Patient Instructions (Signed)
Enalapril Tablets What is this medicine? ENALAPRIL (e NAL a pril) is an ACE inhibitor. It treats high blood pressure. This medicine may be used for other purposes; ask your health care provider or pharmacist if you have questions. COMMON BRAND NAME(S): Vasotec What should I tell my health care provider before I take this medicine? They need to know if you have any of these conditions:  bone marrow disease  heart or blood vessel disease  if you are on a special diet, such as a low salt diet  immune system disease like lupus  kidney or liver disease  low blood pressure  previous swelling of the tongue, face, or lips with difficulty breathing, difficulty swallowing, hoarseness, or tightening of the throat  an unusual or allergic reaction to enalapril, other ACE inhibitors, insect venom, foods, dyes, or preservatives  pregnant or trying to get pregnant  breast-feeding How should I use this medicine? Take this drug by mouth. Take it as directed on the prescription label at the same time every day. You can take it with or without food. If it upsets your stomach, take it with food.Keep taking it unless your health care provider tells you to stop. Talk to your health care provider about the use of this drug in children. While it may be prescribed for selected conditions, precautions do apply. Overdosage: If you think you have taken too much of this medicine contact a poison control center or emergency room at once. NOTE: This medicine is only for you. Do not share this medicine with others. What if I miss a dose? If you miss a dose, take it as soon as you can. If it is almost time for your next dose, take only that dose. Do not take double or extra doses. What may interact with this medicine? Do not take this medication with any of the following medications:  sacubitril; valsartan This medicine may also interact with the following:  diuretics  lithium  medicines for high blood  pressure  NSAIDs, medicines for pain and inflammation, like ibuprofen or naproxen  potassium salts or potassium supplements This list may not describe all possible interactions. Give your health care provider a list of all the medicines, herbs, non-prescription drugs, or dietary supplements you use. Also tell them if you smoke, drink alcohol, or use illegal drugs. Some items may interact with your medicine. What should I watch for while using this medicine? Visit your doctor or health care professional for regular checks on your progress. Check your blood pressure as directed. Ask your doctor or health care professional what your blood pressure should be and when you should contact him or her. Call your doctor or health care professional if you notice an irregular or fast heart beat. You may get drowsy or dizzy. Do not drive, use machinery, or do anything that needs mental alertness until you know how this drug affects you. Do not stand or sit up quickly, especially if you are an older patient. This reduces the risk of dizzy or fainting spells. Alcohol can make you more drowsy and dizzy. Avoid alcoholic drinks. Women should inform their doctor if they wish to become pregnant or think they might be pregnant. There is a potential for serious side effects to an unborn child. Talk to your health care professional or pharmacist for more information. Check with your doctor or health care professional if you get an attack of severe diarrhea, nausea and vomiting, or if you sweat a lot. The loss of too  much body fluid can make it dangerous for you to take this medicine. Avoid salt substitutes unless you are told otherwise by your doctor or health care professional. Do not treat yourself for coughs, colds, or pain while you are taking this medicine without asking your doctor or health care professional for advice. Some ingredients may increase your blood pressure. What side effects may I notice from receiving  this medicine? Side effects that you should report to your doctor or health care professional as soon as possible:  allergic reactions like skin rash, itching or hives, swelling of the face, lips, or tongue  breathing problems  change in amount of urine passed  chest pain  feeling faint or lightheaded, falls  fever or chills  numbness or tingling in your fingers or toes  redness, blistering, peeling or loosening of the skin, including inside the mouth  swelling of ankles, legs  unusual bleeding or bruising or pinpoint red spots on the skin Side effects that usually do not require medical attention (report to your doctor or health care professional if they continue or are bothersome):  change in sex drive or performance  cough  sun sensitivity  tiredness This list may not describe all possible side effects. Call your doctor for medical advice about side effects. You may report side effects to FDA at 1-800-FDA-1088. Where should I keep my medicine? Keep out of the reach of children and pets. Store at room temperature between 15 and 30 degrees C (59 and 86 degrees F). Protect from moisture. Keep the container tightly closed. NOTE: This sheet is a summary. It may not cover all possible information. If you have questions about this medicine, talk to your doctor, pharmacist, or health care provider.  2020 Elsevier/Gold Standard (2018-08-01 11:57:37)   Please follow up with PCP in 2 weeks  Dana Allan, MD  [facility] Medicine Teaching Service

## 2019-10-01 ENCOUNTER — Telehealth: Payer: Self-pay | Admitting: Family Medicine

## 2019-10-01 ENCOUNTER — Encounter: Payer: Self-pay | Admitting: Family Medicine

## 2019-10-01 DIAGNOSIS — I10 Essential (primary) hypertension: Secondary | ICD-10-CM

## 2019-10-01 NOTE — Assessment & Plan Note (Signed)
Mammogram ordered today Flu vaccine today PNA vaccine not given today, patient will decide for next visit Reports Colonoscopy done about 5 years ago, no record in chart. Will readdress at next visit

## 2019-10-01 NOTE — Assessment & Plan Note (Addendum)
Elevated BP today.  She does not want to take Entreesto or Diovan -Continue Metoprolol 100 mg daily -Start Enalapril 5 mg qhs -Monitor BP at home and will review at next visit -Recommend patient follow up with Cards for medication management -Follow up in 4 weeks, will need Bmet and A1c at that time

## 2019-10-01 NOTE — Telephone Encounter (Signed)
Faxed to [employer]

## 2019-10-01 NOTE — Telephone Encounter (Signed)
Recently started on Enalapril.  Called patient and LVM to return clinic call.  Please schedule her for lab Bmet on Fri 09/24.  Dana Allan, MD Family Medicine Residency

## 2019-10-02 NOTE — Telephone Encounter (Signed)
LVM to call office to assist her in getting this lab appointment scheduled.Arnold Depinto Zimmerman Rumple, CMA

## 2019-10-03 ENCOUNTER — Ambulatory Visit: Payer: Medicare Other | Admitting: Physician Assistant

## 2019-10-03 NOTE — Telephone Encounter (Signed)
CGM was denied by [employer]. Patient does not meet criteria, as she is only injecting insulin once per day and one weekly Ozempic injection.

## 2019-10-04 NOTE — Telephone Encounter (Signed)
Contacted patient to inform her insurance denied Freestyle libre due to not having multiple daily insulin injections, however she did not answer. Left HIPAA compliant voicemail requesting patient to call Dr. Macky Lower office when available.  Fabio Neighbors, PharmD PGY2 Ambulatory Care Resident [facility]   Pharmacy

## 2019-10-08 NOTE — Addendum Note (Signed)
Addended by: Kathrin Ruddy on: 10/08/2019 12:30 PM   Modules accepted: Orders

## 2019-10-08 NOTE — Telephone Encounter (Signed)
Contacted patient to make her aware that insurance was not covering her CGM due to only single injection daily for insulin plus Ozempic weekly.    Patient states she is aware that she will not be getting any more CGM supplies.  She states she will not be routinely sticking her fingers but states she has adequate strips if she needs to check.  She reports adherence with her medications (lantus and ozempic).   She thanked me for the follow-up.  Please advise if I can be of further assistance with this patient.

## 2019-10-11 NOTE — Telephone Encounter (Signed)
Contacted pt about scheduling this and she said that she would call back when she could find a convenient time to come. Kizer Nobbe Zimmerman Rumple, CMA

## 2019-10-15 NOTE — Telephone Encounter (Signed)
Pt has an appt on 10/15 to see Dr. Clent Ridges. Sunday Spillers, CMA

## 2019-10-26 ENCOUNTER — Ambulatory Visit: Payer: Medicare Other | Admitting: Family Medicine

## 2019-11-09 ENCOUNTER — Other Ambulatory Visit: Payer: Self-pay | Admitting: Family Medicine

## 2019-11-09 ENCOUNTER — Other Ambulatory Visit: Payer: Self-pay

## 2019-11-09 ENCOUNTER — Ambulatory Visit (INDEPENDENT_AMBULATORY_CARE_PROVIDER_SITE_OTHER): Payer: Medicare Other | Admitting: Family Medicine

## 2019-11-09 VITALS — BP 158/96 | HR 89 | Ht 63.0 in | Wt 183.6 lb

## 2019-11-09 DIAGNOSIS — I10 Essential (primary) hypertension: Secondary | ICD-10-CM | POA: Diagnosis not present

## 2019-11-09 DIAGNOSIS — Z23 Encounter for immunization: Secondary | ICD-10-CM

## 2019-11-09 DIAGNOSIS — E1165 Type 2 diabetes mellitus with hyperglycemia: Secondary | ICD-10-CM | POA: Diagnosis not present

## 2019-11-09 LAB — POCT GLYCOSYLATED HEMOGLOBIN (HGB A1C): Hemoglobin A1C: 8.3 % — AB (ref 4.0–5.6)

## 2019-11-09 MED ORDER — "PEN NEEDLES 5/16"" 31G X 8 MM MISC": 1.0000 | Freq: Every day | 0 refills | Status: DC

## 2019-11-09 NOTE — Progress Notes (Signed)
    SUBJECTIVE:   CHIEF COMPLAINT / HPI: follow up blood pressure  Hypertension  Patient here for follow-up of uncontrolled  hypertension. Blood pressure is not well controlled at home. He/She is exercising and is adherent to a low-salt diet. She currently takes Lisinopril 5 mg daily and is adherent to regimen.  Cardiac symptoms: none. Patient denies: chest pain, chest pressure/discomfort, claudication, dyspnea, fatigue, irregular heart beat, lower extremity edema and palpitations. Cardiovascular risk factors: advanced age (older than 45 for men, 47 for women), diabetes mellitus, dyslipidemia, hypertension and obesity (BMI >= 30 kg/m2). Use of agents associated with hypertension: none.   DM Type 2 Fasting checks: Does not check Compliance: takes meds daily Diet: watches diet  Exercise: moderate A1C: 8.3 Symptoms: Denies symptoms of hypoglycemia. Denies  symptoms of  polyuria, polydipsia. Reports no numbness in extremities, and no foot ulcers/trauma Meds: Lanuts 20u daily (she recently increased the dose from 18u 2 weeks ago), Ozempic 0.5mg  weekly,  Pneumonia vaccine: 11/09/19   Monitoring Labs and Parameters Last A1C:  Lab Results  Component Value Date   HGBA1C 8.3 (A) 11/09/2019   Last Lipid:     Component Value Date/Time   CHOL 156 07/28/2019 0349   CHOL 132 06/08/2017 1110   HDL 32 (L) 07/28/2019 0349   HDL 35 (L) 06/08/2017 1110   Last Bmet  Potassium  Date Value Ref Range Status  11/09/2019 4.5 3.5 - 5.2 mmol/L Final   Sodium  Date Value Ref Range Status  11/09/2019 145 (H) 134 - 144 mmol/L Final   Creatinine, Ser  Date Value Ref Range Status  11/09/2019 0.80 0.57 - 1.00 mg/dL Final      PERTINENT  PMH / PSH:  HTN Combined CHF DM Type 2 Afib on Eliquis  OBJECTIVE:   BP (!) 158/96   Pulse 89   Ht 5\' 3"  (1.6 m)   Wt 183 lb 9.6 oz (83.3 kg)   SpO2 99%   BMI 32.52 kg/m    General: Alert and oriented, no apparent distress  Cardiovascular: RRR with no  murmurs noted Respiratory: CTA bilaterally  Gastrointestinal: Bowel sounds present. No abdominal pain   ASSESSMENT/PLAN:   HTN (hypertension) BP 158/96, goal <120/80.  Patient does not want to increase BP med yet. -24 hr BP monitoring with Dr on Nov 2 -Would like to increase Lisinopril to 10 mg daily, will discuss with patient once monitoring completed -Follow up in 4 weeks or sooner if needed  Uncontrolled type 2 diabetes mellitus with hyperglycemia (HCC) Denies any hypoglycemic events or polyuria, polydipsia.  Does not check sugars at home.  A1c improving at 8.3 today, previously at 9.9.   -Continue Lantus 20 units daily -Continue Ozempic 0.5 mg weekly -Consider adding Jardiance given history HF, although patient does not like taking too many medications.  Will address again at next visit. -Advise not to self increase Lantus given can increase risk of hypoglycemic events. -Bmet today -Follow up in 4 weeks or sooner if needed     Nov 4, MD [facility]

## 2019-11-09 NOTE — Patient Instructions (Addendum)
Thank you for coming to see me today. It was a pleasure.  You should pay attention to your hemoglobin A1C.  It is a three month test about your average blood sugar. If the A1C is - <7.0 is great.  That is our goal for treating you. - Between 7.0 and 9.0 is not so good.  We would need to work to do better. - Above 9.0 is terrible.  You would really need to work with [place] to get it under control.    Today's A1C = 8.3  Check your sugars in the morning before you eat for about 2 times a week  Continue Lantus 20u daily  I will call you with the results of your labs  Appointment with Dr Raymondo Band for 24 hr blood pressure reading on Nov 2 at 9am  Please follow-up with 4 weeks  If you have any questions or concerns, please do not hesitate to call the office at [phone].  Best,   Dana Allan, MD Family Medicine Residency

## 2019-11-10 LAB — BASIC METABOLIC PANEL
BUN/Creatinine Ratio: 16 (ref 12–28)
BUN: 13 mg/dL (ref 8–27)
CO2: 27 mmol/L (ref 20–29)
Calcium: 9.6 mg/dL (ref 8.7–10.3)
Chloride: 105 mmol/L (ref 96–106)
Creatinine, Ser: 0.8 mg/dL (ref 0.57–1.00)
GFR calc Af Amer: 88 mL/min/{1.73_m2} (ref >59)
GFR calc non Af Amer: 77 mL/min/{1.73_m2} (ref >59)
Glucose: 139 mg/dL — ABNORMAL HIGH (ref 65–99)
Potassium: 4.5 mmol/L (ref 3.5–5.2)
Sodium: 145 mmol/L — ABNORMAL HIGH (ref 134–144)

## 2019-11-12 ENCOUNTER — Encounter: Payer: Self-pay | Admitting: Family Medicine

## 2019-11-12 NOTE — Assessment & Plan Note (Signed)
BP 158/96, goal <120/80.  Patient does not want to increase BP med yet. -24 hr BP monitoring with Dr Raymondo Band on Nov 2 -Would like to increase Lisinopril to 10 mg daily, will discuss with patient once monitoring completed -Follow up in 4 weeks or sooner if needed

## 2019-11-12 NOTE — Assessment & Plan Note (Signed)
Denies any hypoglycemic events or polyuria, polydipsia.  Does not check sugars at home.  A1c improving at 8.3 today, previously at 9.9.   -Continue Lantus 20 units daily -Continue Ozempic 0.5 mg weekly -Consider adding Jardiance given history HF, although patient does not like taking too many medications.  Will address again at next visit. -Advise not to self increase Lantus given can increase risk of hypoglycemic events. -Bmet today -Follow up in 4 weeks or sooner if needed

## 2019-11-13 ENCOUNTER — Ambulatory Visit (INDEPENDENT_AMBULATORY_CARE_PROVIDER_SITE_OTHER): Payer: Medicare Other | Admitting: Pharmacist

## 2019-11-13 ENCOUNTER — Other Ambulatory Visit: Payer: Self-pay

## 2019-11-13 ENCOUNTER — Encounter: Payer: Self-pay | Admitting: Pharmacist

## 2019-11-13 DIAGNOSIS — I10 Essential (primary) hypertension: Secondary | ICD-10-CM

## 2019-11-13 DIAGNOSIS — E1165 Type 2 diabetes mellitus with hyperglycemia: Secondary | ICD-10-CM | POA: Diagnosis not present

## 2019-11-13 NOTE — Progress Notes (Signed)
S:    Patient arrives in good spirits and ambulating without assistance.    Presents to the clinic for ambulatory blood pressure evaluation with an ambulatory blood pressure monitor .   Patient was referred and last seen by Primary Care Provider on 11/09/2019.    Patient has ~10 years of clinically diagnosed hypertension.    Medication compliance is reported to be compliant with all medications.  Discussed procedure for wearing the monitor and gave patient written instructions. Monitor was placed on non-dominant arm with instructions to return in the morning.   Current BP Medications include:  Enalapril 5mg  taken daily at bedtime and metoprolol succinate 100 mg daily.   Antihypertensives tried in the past include: carvedilol 25 mg BID, diltiazem 240 mg daily, lisinopril hydrochlorothiazide 20/25 mg daily, metoprolol tartrate 50 mg BID, sacubitril/valsartan 49/51 mg daily, HCTZ 12.5mg  and traimaterene/ HCTZ 37.5/25 mg daily.    Patient reports low readings with aggressive blood pressure lowering regimen in the past 6 months.  She prefers to avoid lowering blood pressure to a level that causes her to feel light-headed/dizzy.   O:  Physical Exam Vitals reviewed.  Pulmonary:     Effort: Pulmonary effort is normal.  Neurological:     Mental Status: She is alert.  Psychiatric:        Mood and Affect: Mood normal.        Thought Content: Thought content normal.      Review of Systems  All other systems reviewed and are negative.   Last 3 Office BP readings: BP Readings from Last 3 Encounters:  11/09/19 (!) 158/96  09/28/19 (!) 162/110  09/19/19 (!) 160/100    Basic Metabolic Panel    Component Value Date/Time   NA 145 (H) 11/09/2019 1201   K 4.5 11/09/2019 1201   CL 105 11/09/2019 1201   CO2 27 11/09/2019 1201   GLUCOSE 139 (H) 11/09/2019 1201   GLUCOSE 167 (H) 08/01/2019 0225   BUN 13 11/09/2019 1201   CREATININE 0.80 11/09/2019 1201   CALCIUM 9.6 11/09/2019 1201    GFRNONAA 77 11/09/2019 1201   GFRAA 88 11/09/2019 1201    Renal function: Estimated Creatinine Clearance: 69.8 mL/min (by C-G formula based on SCr of 0.8 mg/dL).   Today's Office Blood Pressure (BP) reading: 223/119 mmHg (manual reading)  ABPM Study Data: Arm Placement left arm   Overall Mean 24hr BP:   199/103 mmHg HR: 78   Daytime Mean BP:  200/102 mmHg HR: 77   Nighttime Mean BP:  199/108 mmHg HR: 83   Dipping Pattern: No.  Sys:   0%   Dia: -5%   [normal dipping ~10-20%]  Non-hypertensive ABPM thresholds: daytime BP <125/75 mmHg, sleeptime BP <120/70 mmHg    A/P: History of hypertension for at least 10 years, found to have elevated blood pressure on 24-hour ambulatory blood pressure monitor.  Average AWAKE blood pressure of 200/102 mmHg, and a nocturnal dipping pattern that is abnormal  with readings 199/108. Following some discussion of benefit, risks and side effects the following changes to medications were implemented: - Increase enalapril from 5mg  QHS to 10mg  (2X 5mg )  - START Amlodipine 2.5mg  once daily QHS  At next follow-up consideration of either ARB+ low dose Thiazide or possibly return to carvedilol or titration of amlodipine.  I would be happy to discuss at that time.   Diabetes long-standing with improved control on Ozempic and Lantus per report of home blood glucose readings.  Patient requested refill for  Ozempic 0.5mg  weekly.  Refill provided.    Results reviewed and written information provided.  Total time in face-to-face counseling 25 minutes.   F/U Clinic Visit with Dr. Clent Ridges in 2-3 weeks. .  Patient seen with Jani Gravel, PharmD Candidate, and Rexford Maus, PharmD - PGY-1 Resident.

## 2019-11-13 NOTE — Patient Instructions (Addendum)
Your blood pressure report showed elevated readings.   Changes today: - Increase your enalapril to TWO tablets at night.  - START amlodipine 2.5mg  at night.   Refill for Ozempic sent to day - continue same dose.   Follow-up with Dr. Clent Ridges in 2-3 weeks.

## 2019-11-14 MED ORDER — OZEMPIC (0.25 OR 0.5 MG/DOSE) 2 MG/1.5ML ~~LOC~~ SOPN: 0.5000 mg | PEN_INJECTOR | SUBCUTANEOUS | 3 refills | Status: DC

## 2019-11-14 MED ORDER — ENALAPRIL MALEATE 5 MG PO TABS: 10.0000 mg | ORAL_TABLET | Freq: Every day | ORAL | Status: DC

## 2019-11-14 MED ORDER — AMLODIPINE BESYLATE 2.5 MG PO TABS: 2.5000 mg | ORAL_TABLET | Freq: Every day | ORAL | 1 refills | Status: DC

## 2019-11-14 NOTE — Assessment & Plan Note (Signed)
History of hypertension for at least 10 years, found to have elevated blood pressure on 24-hour ambulatory blood pressure monitor.  Average AWAKE blood pressure of 200/102 mmHg, and a nocturnal dipping pattern that is abnormal  with readings 199/108. Following some discussion of benefit, risks and side effects the following changes to medications were implemented: - Increase enalapril from 5mg  QHS to 10mg  (2X 5mg )  - START Amlodipine 2.5mg  once daily QHS  At next follow-up consideration of either ARB+ low dose Thiazide or possibly return to carvedilol or titration of amlodipine.  I would be happy to discuss at that time.

## 2019-11-14 NOTE — Progress Notes (Signed)
Reviewed: I agree with Dr. Koval's documentation and management. 

## 2020-02-03 ENCOUNTER — Other Ambulatory Visit: Payer: Self-pay

## 2020-02-03 ENCOUNTER — Encounter (HOSPITAL_BASED_OUTPATIENT_CLINIC_OR_DEPARTMENT_OTHER): Payer: Self-pay | Admitting: Emergency Medicine

## 2020-02-03 ENCOUNTER — Emergency Department (HOSPITAL_BASED_OUTPATIENT_CLINIC_OR_DEPARTMENT_OTHER): Payer: Medicare Other

## 2020-02-03 ENCOUNTER — Inpatient Hospital Stay (HOSPITAL_BASED_OUTPATIENT_CLINIC_OR_DEPARTMENT_OTHER)
Admission: EM | Admit: 2020-02-03 | Discharge: 2020-02-07 | DRG: 308 | Disposition: A | Discharge status: Home Health | Payer: Medicare Other | Attending: Family Medicine | Admitting: Family Medicine

## 2020-02-03 DIAGNOSIS — B370 Candidal stomatitis: Secondary | ICD-10-CM | POA: Diagnosis not present

## 2020-02-03 DIAGNOSIS — Z886 Allergy status to analgesic agent status: Secondary | ICD-10-CM | POA: Diagnosis not present

## 2020-02-03 DIAGNOSIS — Z6829 Body mass index (BMI) 29.0-29.9, adult: Secondary | ICD-10-CM

## 2020-02-03 DIAGNOSIS — R1013 Epigastric pain: Secondary | ICD-10-CM

## 2020-02-03 DIAGNOSIS — I484 Atypical atrial flutter: Secondary | ICD-10-CM | POA: Diagnosis present

## 2020-02-03 DIAGNOSIS — I5042 Chronic combined systolic (congestive) and diastolic (congestive) heart failure: Secondary | ICD-10-CM | POA: Diagnosis not present

## 2020-02-03 DIAGNOSIS — U071 COVID-19: Secondary | ICD-10-CM | POA: Diagnosis not present

## 2020-02-03 DIAGNOSIS — I420 Dilated cardiomyopathy: Secondary | ICD-10-CM | POA: Diagnosis present

## 2020-02-03 DIAGNOSIS — Z833 Family history of diabetes mellitus: Secondary | ICD-10-CM

## 2020-02-03 DIAGNOSIS — E785 Hyperlipidemia, unspecified: Secondary | ICD-10-CM | POA: Diagnosis not present

## 2020-02-03 DIAGNOSIS — I517 Cardiomegaly: Secondary | ICD-10-CM | POA: Diagnosis not present

## 2020-02-03 DIAGNOSIS — I340 Nonrheumatic mitral (valve) insufficiency: Secondary | ICD-10-CM | POA: Diagnosis not present

## 2020-02-03 DIAGNOSIS — N3000 Acute cystitis without hematuria: Secondary | ICD-10-CM | POA: Diagnosis not present

## 2020-02-03 DIAGNOSIS — I5043 Acute on chronic combined systolic (congestive) and diastolic (congestive) heart failure: Secondary | ICD-10-CM | POA: Diagnosis present

## 2020-02-03 DIAGNOSIS — K649 Unspecified hemorrhoids: Secondary | ICD-10-CM | POA: Diagnosis present

## 2020-02-03 DIAGNOSIS — E1165 Type 2 diabetes mellitus with hyperglycemia: Secondary | ICD-10-CM | POA: Diagnosis not present

## 2020-02-03 DIAGNOSIS — Z794 Long term (current) use of insulin: Secondary | ICD-10-CM | POA: Diagnosis not present

## 2020-02-03 DIAGNOSIS — I429 Cardiomyopathy, unspecified: Secondary | ICD-10-CM | POA: Diagnosis not present

## 2020-02-03 DIAGNOSIS — Z8249 Family history of ischemic heart disease and other diseases of the circulatory system: Secondary | ICD-10-CM | POA: Diagnosis not present

## 2020-02-03 DIAGNOSIS — Z7901 Long term (current) use of anticoagulants: Secondary | ICD-10-CM

## 2020-02-03 DIAGNOSIS — R000 Tachycardia, unspecified: Secondary | ICD-10-CM | POA: Diagnosis present

## 2020-02-03 DIAGNOSIS — I110 Hypertensive heart disease with heart failure: Secondary | ICD-10-CM | POA: Diagnosis present

## 2020-02-03 DIAGNOSIS — Z79899 Other long term (current) drug therapy: Secondary | ICD-10-CM

## 2020-02-03 DIAGNOSIS — I4891 Unspecified atrial fibrillation: Secondary | ICD-10-CM | POA: Diagnosis not present

## 2020-02-03 DIAGNOSIS — Z823 Family history of stroke: Secondary | ICD-10-CM | POA: Diagnosis not present

## 2020-02-03 DIAGNOSIS — Z5320 Procedure and treatment not carried out because of patient's decision for unspecified reasons: Secondary | ICD-10-CM | POA: Diagnosis not present

## 2020-02-03 DIAGNOSIS — Z91010 Allergy to peanuts: Secondary | ICD-10-CM

## 2020-02-03 DIAGNOSIS — Z91018 Allergy to other foods: Secondary | ICD-10-CM

## 2020-02-03 DIAGNOSIS — R0602 Shortness of breath: Secondary | ICD-10-CM | POA: Diagnosis not present

## 2020-02-03 DIAGNOSIS — I495 Sick sinus syndrome: Secondary | ICD-10-CM | POA: Diagnosis present

## 2020-02-03 DIAGNOSIS — I480 Paroxysmal atrial fibrillation: Secondary | ICD-10-CM | POA: Diagnosis not present

## 2020-02-03 DIAGNOSIS — R1310 Dysphagia, unspecified: Secondary | ICD-10-CM | POA: Insufficient documentation

## 2020-02-03 DIAGNOSIS — I5041 Acute combined systolic (congestive) and diastolic (congestive) heart failure: Secondary | ICD-10-CM

## 2020-02-03 DIAGNOSIS — I10 Essential (primary) hypertension: Secondary | ICD-10-CM | POA: Diagnosis not present

## 2020-02-03 DIAGNOSIS — Z820 Family history of epilepsy and other diseases of the nervous system: Secondary | ICD-10-CM

## 2020-02-03 DIAGNOSIS — I361 Nonrheumatic tricuspid (valve) insufficiency: Secondary | ICD-10-CM | POA: Diagnosis not present

## 2020-02-03 DIAGNOSIS — R197 Diarrhea, unspecified: Secondary | ICD-10-CM | POA: Diagnosis present

## 2020-02-03 DIAGNOSIS — R030 Elevated blood-pressure reading, without diagnosis of hypertension: Secondary | ICD-10-CM | POA: Diagnosis not present

## 2020-02-03 HISTORY — DX: Morbid (severe) obesity due to excess calories: E66.01

## 2020-02-03 HISTORY — DX: Epigastric pain: R10.13

## 2020-02-03 HISTORY — DX: Dysphagia, unspecified: R13.10

## 2020-02-03 HISTORY — DX: Type 2 diabetes mellitus without complications: E11.9

## 2020-02-03 LAB — SARS CORONAVIRUS 2 BY RT PCR (HOSPITAL ORDER, PERFORMED IN ~~LOC~~ HOSPITAL LAB): SARS Coronavirus 2: POSITIVE — AB

## 2020-02-03 LAB — COMPREHENSIVE METABOLIC PANEL
ALT: 73 U/L — ABNORMAL HIGH (ref 0–44)
AST: 50 U/L — ABNORMAL HIGH (ref 15–41)
Albumin: 2.8 g/dL — ABNORMAL LOW (ref 3.5–5.0)
Alkaline Phosphatase: 91 U/L (ref 38–126)
Anion gap: 8 (ref 5–15)
BUN: 15 mg/dL (ref 8–23)
CO2: 27 mmol/L (ref 22–32)
Calcium: 8.4 mg/dL — ABNORMAL LOW (ref 8.9–10.3)
Chloride: 103 mmol/L (ref 98–111)
Creatinine, Ser: 0.92 mg/dL (ref 0.44–1.00)
GFR, Estimated: >60 mL/min (ref >60)
Glucose, Bld: 133 mg/dL — ABNORMAL HIGH (ref 70–99)
Potassium: 4.2 mmol/L (ref 3.5–5.1)
Sodium: 138 mmol/L (ref 135–145)
Total Bilirubin: 0.4 mg/dL (ref 0.3–1.2)
Total Protein: 7.2 g/dL (ref 6.5–8.1)

## 2020-02-03 LAB — URINALYSIS, ROUTINE W REFLEX MICROSCOPIC
Bilirubin Urine: NEGATIVE
Glucose, UA: NEGATIVE mg/dL
Ketones, ur: NEGATIVE mg/dL
Nitrite: NEGATIVE
Protein, ur: 100 mg/dL — AB
Specific Gravity, Urine: 1.03 (ref 1.005–1.030)
pH: 5 (ref 5.0–8.0)

## 2020-02-03 LAB — CBC WITH DIFFERENTIAL/PLATELET
Abs Immature Granulocytes: 0 10*3/uL (ref 0.00–0.07)
Basophils Absolute: 0 10*3/uL (ref 0.0–0.1)
Basophils Relative: 0 %
Eosinophils Absolute: 0.1 10*3/uL (ref 0.0–0.5)
Eosinophils Relative: 1 %
HCT: 44.4 % (ref 36.0–46.0)
Hemoglobin: 13.9 g/dL (ref 12.0–15.0)
Lymphocytes Relative: 63 %
Lymphs Abs: 4.3 10*3/uL — ABNORMAL HIGH (ref 0.7–4.0)
MCH: 25.5 pg — ABNORMAL LOW (ref 26.0–34.0)
MCHC: 31.3 g/dL (ref 30.0–36.0)
MCV: 81.3 fL (ref 80.0–100.0)
Monocytes Absolute: 0.3 10*3/uL (ref 0.1–1.0)
Monocytes Relative: 5 %
Neutro Abs: 2.1 10*3/uL (ref 1.7–7.7)
Neutrophils Relative %: 31 %
Platelets: 233 10*3/uL (ref 150–400)
RBC: 5.46 MIL/uL — ABNORMAL HIGH (ref 3.87–5.11)
RDW: 14.5 % (ref 11.5–15.5)
WBC: 6.8 10*3/uL (ref 4.0–10.5)
nRBC: 0 % (ref 0.0–0.2)

## 2020-02-03 LAB — URINALYSIS, MICROSCOPIC (REFLEX): WBC, UA: >50 WBC/hpf (ref 0–5)

## 2020-02-03 LAB — LACTIC ACID, PLASMA: Lactic Acid, Venous: 1 mmol/L (ref 0.5–1.9)

## 2020-02-03 LAB — CBG MONITORING, ED: Glucose-Capillary: 132 mg/dL — ABNORMAL HIGH (ref 70–99)

## 2020-02-03 MED ORDER — SODIUM CHLORIDE 0.9 % IV SOLN
INTRAVENOUS | Status: DC | PRN
  Administered 2020-02-03: 250 mL via INTRAVENOUS

## 2020-02-03 MED ORDER — DILTIAZEM HCL 25 MG/5ML IV SOLN
10.0000 mg | Freq: Once | INTRAVENOUS | Status: AC
  Administered 2020-02-03: 10 mg via INTRAVENOUS
  Filled 2020-02-03: qty 5

## 2020-02-03 MED ORDER — SODIUM CHLORIDE 0.9 % IV BOLUS
1000.0000 mL | Freq: Once | INTRAVENOUS | Status: AC
  Administered 2020-02-03: 1000 mL via INTRAVENOUS

## 2020-02-03 MED ORDER — SODIUM CHLORIDE 0.9 % IV SOLN
1.0000 g | Freq: Once | INTRAVENOUS | Status: AC
  Administered 2020-02-03: 1 g via INTRAVENOUS
  Filled 2020-02-03: qty 10

## 2020-02-03 MED ORDER — DILTIAZEM HCL-DEXTROSE 125-5 MG/125ML-% IV SOLN (PREMIX)
5.0000 mg/h | INTRAVENOUS | Status: DC
  Administered 2020-02-03: 5 mg/h via INTRAVENOUS
  Administered 2020-02-04: 15 mg/h via INTRAVENOUS
  Administered 2020-02-04: 10 mg/h via INTRAVENOUS
  Filled 2020-02-03 (×3): qty 125

## 2020-02-03 MED ORDER — INSULIN GLARGINE 100 UNIT/ML ~~LOC~~ SOLN
18.0000 [IU] | Freq: Once | SUBCUTANEOUS | Status: AC
  Administered 2020-02-03: 18 [IU] via SUBCUTANEOUS
  Filled 2020-02-03: qty 1

## 2020-02-03 MED ORDER — APIXABAN 2.5 MG PO TABS
5.0000 mg | ORAL_TABLET | Freq: Once | ORAL | Status: AC
  Administered 2020-02-03: 5 mg via ORAL
  Filled 2020-02-03: qty 2

## 2020-02-03 NOTE — ED Notes (Signed)
O2 sats 92-96% while ambulating on RA

## 2020-02-03 NOTE — ED Notes (Signed)
Per EDP Yao as long as pt HR remains below 150 on 15mg  of cardizem no new orders.

## 2020-02-03 NOTE — ED Provider Notes (Addendum)
[facility] EMERGENCY DEPARTMENT Provider Note   CSN: [id number] Arrival date & time: 02/03/20  1040     History Chief Complaint  Patient presents with  . Shortness of Breath    [name] [name] is a 70 y.o. female past medical history of atrial fibrillation on Eliquis, type 2 diabetes, hypertension, combined systolic and diastolic heart failure, presenting to the emergency department with complaint of 1 week of feeling very fatigued.  She was seen at urgent care prior to arrival and sent here for tachycardia.  She endorses COVID exposure recently.  She feels more short of breath when she stands up and exerts herself.  Intermittent cough. Has had an episode or 2 of diarrhea though no vomiting or nausea. She is not having any chest pain or abdominal pain.    No fevers or chills.  No new leg swelling.  No urinary symptoms. She is vaccinated against COVID including a booster.  The history is provided by the patient.       Past Medical History:  Diagnosis Date  . Abdominal pain   . Back pain   . Constipation   . Diabetes mellitus without complication ([place])   . Fatigue   . Full dentures   . Hemorrhoids   . Hypertension   . Lack of adequate sleep   . Liver mass   . Rash    on right breast  . Wears glasses     Patient Active Problem List   Diagnosis Date Noted  . Atrial fibrillation with RVR ([place]) 02/03/2020  . Acute combined systolic and diastolic heart failure ([place])   . Morbid obesity ([place])   . Atrial tachycardia ([place]) 07/29/2019  . History of esophageal stricture 07/29/2019  . Atrial fibrillation with rapid ventricular response ([place]) 07/26/2019  . Rash 12/30/2017  . Healthcare maintenance 09/07/2017  . Osteopenia after menopause 06/09/2017  . Uncontrolled type 2 diabetes mellitus with hyperglycemia ([place]) 06/08/2017  . Back pain 11/15/2014  . Pericardial effusion 05/12/2010  . HTN (hypertension) 05/12/2010  . Mixed hyperlipidemia 05/12/2010    Past  Surgical History:  Procedure Laterality Date  . BREAST DUCTAL SYSTEM EXCISION    . BREAST EXCISIONAL BIOPSY Left   . CARDIOVERSION N/A 07/31/2019   Procedure: CARDIOVERSION;  Surgeon: Pixie Casino, MD;  Location: [facility] ENDOSCOPY;  Service: Cardiovascular;  Laterality: N/A;  . CHOLECYSTECTOMY    . CHOLECYSTECTOMY, LAPAROSCOPIC  07/24/2010  . ESOPHAGEAL DILATION    . TEE WITHOUT CARDIOVERSION N/A 07/31/2019   Procedure: TRANSESOPHAGEAL ECHOCARDIOGRAM (TEE);  Surgeon: Pixie Casino, MD;  Location: [facility] ENDOSCOPY;  Service: Cardiovascular;  Laterality: N/A;     OB History   No obstetric history on file.     Family History  Problem Relation Age of Onset  . Stroke Mother   . Other Father        broken heart after spouse spouse  . Diabetes Sister   . Hypertension Sister   . Diabetes Sister   . Multiple sclerosis Sister   . Alcohol abuse Other   . Diabetes Other   . Breast cancer Neg Hx     Social History   Tobacco Use  . Smoking status: Never Smoker  . Smokeless tobacco: Never Used  Vaping Use  . Vaping Use: Never used  Substance Use Topics  . Alcohol use: No    Alcohol/week: 0.0 standard drinks  . Drug use: No    Home Medications Prior to Admission medications   Medication Sig Start  Date End Date Taking? Authorizing Provider  amLODipine (NORVASC) 2.5 MG tablet Take 1 tablet (2.5 mg total) by mouth at bedtime. 11/14/19   Leavy Cella, RPH-CPP  Blood Pressure KIT Monitor blood pressure twice a day 09/28/19   Carollee Leitz, MD  docusate sodium (COLACE) 100 MG capsule Take 100 mg by mouth daily.    [provider]  ELIQUIS 5 MG TABS tablet Take 5 mg by mouth 2 (two) times daily. 09/28/19   [provider]  enalapril (VASOTEC) 5 MG tablet Take 2 tablets (10 mg total) by mouth at bedtime. 11/14/19   Leavy Cella, RPH-CPP  glucose blood (ACCU-CHEK AVIVA) test strip Use as instructed 06/16/17   Zenia Resides, MD  insulin glargine (LANTUS) 100 UNIT/ML  Solostar Pen Inject 18 Units into the skin at bedtime. 08/09/19   Leavy Cella, RPH-CPP  Insulin Pen Needle (PEN NEEDLES 31GX5/16") 31G X 8 MM MISC 1 each by Does not apply route at bedtime. Use to inject Lantus Solostart insulin pen 11/09/19   Carollee Leitz, MD  Lancets (ACCU-CHEK SOFT [facility]) lancets Once daily testing. 06/16/17   Zenia Resides, MD  Magnesium Oxide 400 MG CAPS Take 1 capsule (400 mg total) by mouth daily. 08/17/19   Richardson Dopp T, PA-C  metoprolol succinate (TOPROL-XL) 100 MG 24 hr tablet Take 1 tablet (100 mg total) by mouth daily. Take with or immediately following a meal. 08/17/19   Weaver, Scott T, PA-C  Semaglutide,0.25 or 0.5MG/DOS, (OZEMPIC, 0.25 OR 0.5 MG/DOSE,) 2 MG/1.5ML SOPN Inject 0.5 mg into the skin once a week. 11/14/19   Leavy Cella, RPH-CPP    Allergies    Fruit & vegetable daily [nutritional supplements], Other, Peanut-containing drug products, Aspirin, and Tylenol [acetaminophen]  Review of Systems   Review of Systems  Constitutional: Positive for fatigue.  Respiratory: Positive for shortness of breath.   All other systems reviewed and are negative.   Physical Exam Updated Vital Signs BP (!) 139/103   Pulse (!) 125   Temp 99.8 F (37.7 C) (Oral)   Resp 19   Ht 5' 3" (1.6 m)   Wt 74.8 kg   SpO2 98%   BMI 29.23 kg/m   Physical Exam Vitals and nursing note reviewed.  Constitutional:      Appearance: She is well-developed and well-nourished.  HENT:     Head: Normocephalic and atraumatic.  Eyes:     Conjunctiva/sclera: Conjunctivae normal.  Cardiovascular:     Rate and Rhythm: Regular rhythm. Tachycardia present.  Pulmonary:     Effort: Pulmonary effort is normal.     Breath sounds: Normal breath sounds.  Abdominal:     General: Bowel sounds are normal.     Palpations: Abdomen is soft.     Tenderness: There is no abdominal tenderness. There is no guarding or rebound.  Musculoskeletal:     Right lower leg: No edema.     Left lower  leg: No edema.  Skin:    General: Skin is warm.  Neurological:     Mental Status: She is alert.  Psychiatric:        Mood and Affect: Mood and affect normal.        Behavior: Behavior normal.     ED Results / Procedures / Treatments   Labs (all labs ordered are listed, but only abnormal results are displayed) Labs Reviewed  SARS CORONAVIRUS 2 BY RT PCR (HOSPITAL ORDER, [place] [facility]) - Abnormal; Notable  for the following components:      Result Value   SARS Coronavirus 2 POSITIVE (*)    All other components within normal limits  COMPREHENSIVE METABOLIC PANEL - Abnormal; Notable for the following components:   Glucose, Bld 133 (*)    Calcium 8.4 (*)    Albumin 2.8 (*)    AST 50 (*)    ALT 73 (*)    All other components within normal limits  CBC WITH DIFFERENTIAL/PLATELET - Abnormal; Notable for the following components:   RBC 5.46 (*)    MCH 25.5 (*)    Lymphs Abs 4.3 (*)    All other components within normal limits  URINALYSIS, ROUTINE W REFLEX MICROSCOPIC - Abnormal; Notable for the following components:   Color, Urine AMBER (*)    APPearance CLOUDY (*)    Hgb urine dipstick TRACE (*)    Protein, ur 100 (*)    Leukocytes,Ua SMALL (*)    All other components within normal limits  URINALYSIS, MICROSCOPIC (REFLEX) - Abnormal; Notable for the following components:   Bacteria, UA MANY (*)    All other components within normal limits  CBG MONITORING, ED - Abnormal; Notable for the following components:   Glucose-Capillary 132 (*)    All other components within normal limits  URINE CULTURE  LACTIC ACID, PLASMA    EKG EKG Interpretation  Date/Time:  Sunday February 03 2020 14:21:28 EST Ventricular Rate:  127 PR Interval:    QRS Duration: 79 QT Interval:  294 QTC Calculation: 428 R Axis:   81 Text Interpretation: sinus tachycardia vs aflutter Borderline right axis deviation Borderline repolarization abnormality previous EKG showed afib  Confirmed by Wandra Arthurs ([id number]) on 02/03/2020 4:35:19 PM   Radiology DG Chest Port 1 View  Result Date: 02/03/2020 CLINICAL DATA:  Shortness of breath. EXAM: PORTABLE CHEST 1 VIEW COMPARISON:  Chest radiograph 07/26/2019. FINDINGS: Stable enlarged cardiac and mediastinal contours. No consolidative pulmonary opacities. No pleural effusion or pneumothorax. Osseous structures unremarkable. IMPRESSION: No active disease. Electronically Signed   By: Lovey Newcomer M.D.   On: 02/03/2020 12:14    Procedures Procedures (including critical care time) CRITICAL CARE Performed by: [place] N    Total critical care time: 40 minutes  Critical care time was exclusive of separately billable procedures and treating other patients.  Critical care was necessary to treat or prevent imminent or life-threatening deterioration.  Critical care was time spent personally by me on the following activities: development of treatment plan with patient and/or surrogate as well as nursing, discussions with consultants, evaluation of patient's response to treatment, examination of patient, obtaining history from patient or surrogate, ordering and performing treatments and interventions, ordering and review of laboratory studies, ordering and review of radiographic studies, pulse oximetry and re-evaluation of patient's condition.  Medications Ordered in ED Medications  diltiazem (CARDIZEM) 125 mg in dextrose 5% 125 mL (1 mg/mL) infusion (15 mg/hr Intravenous Rate/Dose Change 02/03/20 2001)  0.9 %  sodium chloride infusion ( Intravenous Stopped 02/03/20 1959)  diltiazem (CARDIZEM) injection 10 mg (has no administration in time range)  sodium chloride 0.9 % bolus 1,000 mL ( Intravenous Stopped 02/03/20 1809)  diltiazem (CARDIZEM) injection 10 mg (10 mg Intravenous Given 02/03/20 1712)  cefTRIAXone (ROCEPHIN) 1 g in sodium chloride 0.9 % 100 mL IVPB ( Intravenous Stopped 02/03/20 1952)  apixaban (ELIQUIS) tablet 5 mg (5 mg  Oral Given 02/03/20 1842)  insulin glargine (LANTUS) injection 18 Units (18 Units Subcutaneous Given 02/03/20 1908)  ED Course  I have reviewed the triage vital signs and the nursing notes.  Pertinent labs & imaging results that were available during my care of the patient were reviewed by me and considered in my medical decision making (see chart for details).    MDM Rules/Calculators/A&P                          Patient here with 1 week of symptoms and positive COVID exposure. Was found to be tachycardic at [facility] and sent here for further evaluation. She has known hx of afib and is compliant on eliquis. She endorses shortness of breath, fatigue, intermittent cough. No chest pain. On exam she is in no distress. Speaking in full sentences. She does have a tachycardia around 130, EKG peers to be either A atrial flutter with RVR versus sinus tachycardia. She is very compliant on Eliquis, doubt PE. Chest x-ray is negative. She is ambulating well taking O2 sat on room air. UA with bacteriuria though asymptomatic. She is treated with Rocephin, urine culture sent   COVID swab is positive. Labs are reassuring. Her tachycardia is treated with IV fluids and Cardizem bolus without any improvement. She is started on Cardizem infusion and admitted for rate control of her tachycardia that appears to be more consistent with a flutter with RVR.  [name] [name] was evaluated in Emergency Department on 02/03/2020 for the symptoms described in the history of present illness. She was evaluated in the context of the global COVID-19 pandemic, which necessitated consideration that the patient might be at risk for infection with the SARS-CoV-2 virus that causes COVID-19. Institutional protocols and algorithms that pertain to the evaluation of patients at risk for COVID-19 are in a state of rapid change based on information released by regulatory bodies including the CDC and federal and state organizations. These policies  and algorithms were followed during the patient's care in the ED.  Final Clinical Impression(s) / ED Diagnoses Final diagnoses:  COVID-19  Atrial fibrillation with RVR [facility])    Rx / [place] Orders ED Discharge Orders    None       Lenoard Helbert, [place] N, PA-C 02/03/20 2041    Rune Mendez, [place] N, PA-C 02/03/20 2042    Drenda Freeze, MD 02/06/20 1050

## 2020-02-03 NOTE — ED Notes (Signed)
Attempted to call report; call transferred, not answered; will try again.

## 2020-02-03 NOTE — ED Notes (Signed)
Water provided to pt.

## 2020-02-03 NOTE — ED Notes (Signed)
Provided pt with frozen meal (meatloaf/potatoes), Ginger Ale and graham crackers with peanut butter; pt sts she has not eaten today before this.

## 2020-02-03 NOTE — ED Triage Notes (Signed)
Pt arrives pov with driver, c/o increased b/p and shob x 2 days

## 2020-02-04 ENCOUNTER — Encounter (HOSPITAL_COMMUNITY): Payer: Self-pay | Admitting: Family Medicine

## 2020-02-04 ENCOUNTER — Telehealth: Payer: Self-pay | Admitting: *Deleted

## 2020-02-04 ENCOUNTER — Observation Stay (HOSPITAL_COMMUNITY): Payer: Medicare Other

## 2020-02-04 ENCOUNTER — Observation Stay (HOSPITAL_BASED_OUTPATIENT_CLINIC_OR_DEPARTMENT_OTHER): Payer: Medicare Other

## 2020-02-04 DIAGNOSIS — I4891 Unspecified atrial fibrillation: Secondary | ICD-10-CM | POA: Diagnosis not present

## 2020-02-04 DIAGNOSIS — B370 Candidal stomatitis: Secondary | ICD-10-CM

## 2020-02-04 DIAGNOSIS — N3000 Acute cystitis without hematuria: Secondary | ICD-10-CM

## 2020-02-04 DIAGNOSIS — U071 COVID-19: Secondary | ICD-10-CM

## 2020-02-04 DIAGNOSIS — Z7901 Long term (current) use of anticoagulants: Secondary | ICD-10-CM

## 2020-02-04 DIAGNOSIS — I361 Nonrheumatic tricuspid (valve) insufficiency: Secondary | ICD-10-CM

## 2020-02-04 DIAGNOSIS — I340 Nonrheumatic mitral (valve) insufficiency: Secondary | ICD-10-CM | POA: Diagnosis not present

## 2020-02-04 DIAGNOSIS — I517 Cardiomegaly: Secondary | ICD-10-CM | POA: Diagnosis not present

## 2020-02-04 DIAGNOSIS — I5042 Chronic combined systolic (congestive) and diastolic (congestive) heart failure: Secondary | ICD-10-CM

## 2020-02-04 DIAGNOSIS — R0602 Shortness of breath: Secondary | ICD-10-CM | POA: Diagnosis not present

## 2020-02-04 HISTORY — DX: Chronic combined systolic (congestive) and diastolic (congestive) heart failure: I50.42

## 2020-02-04 HISTORY — DX: Long term (current) use of anticoagulants: Z79.01

## 2020-02-04 HISTORY — DX: COVID-19: U07.1

## 2020-02-04 HISTORY — DX: Candidal stomatitis: B37.0

## 2020-02-04 LAB — CBC WITH DIFFERENTIAL/PLATELET
Abs Immature Granulocytes: 0.03 10*3/uL (ref 0.00–0.07)
Basophils Absolute: 0 10*3/uL (ref 0.0–0.1)
Basophils Relative: 0 %
Eosinophils Absolute: 0.1 10*3/uL (ref 0.0–0.5)
Eosinophils Relative: 2 %
HCT: 44.8 % (ref 36.0–46.0)
Hemoglobin: 13.5 g/dL (ref 12.0–15.0)
Immature Granulocytes: 1 %
Lymphocytes Relative: 41 %
Lymphs Abs: 2.3 10*3/uL (ref 0.7–4.0)
MCH: 24.9 pg — ABNORMAL LOW (ref 26.0–34.0)
MCHC: 30.1 g/dL (ref 30.0–36.0)
MCV: 82.5 fL (ref 80.0–100.0)
Monocytes Absolute: 0.4 10*3/uL (ref 0.1–1.0)
Monocytes Relative: 7 %
Neutro Abs: 2.8 10*3/uL (ref 1.7–7.7)
Neutrophils Relative %: 49 %
Platelets: 200 10*3/uL (ref 150–400)
RBC: 5.43 MIL/uL — ABNORMAL HIGH (ref 3.87–5.11)
RDW: 14.6 % (ref 11.5–15.5)
WBC: 5.7 10*3/uL (ref 4.0–10.5)
nRBC: 0 % (ref 0.0–0.2)

## 2020-02-04 LAB — COMPREHENSIVE METABOLIC PANEL
ALT: 61 U/L — ABNORMAL HIGH (ref 0–44)
AST: 36 U/L (ref 15–41)
Albumin: 2.4 g/dL — ABNORMAL LOW (ref 3.5–5.0)
Alkaline Phosphatase: 77 U/L (ref 38–126)
Anion gap: 10 (ref 5–15)
BUN: 12 mg/dL (ref 8–23)
CO2: 21 mmol/L — ABNORMAL LOW (ref 22–32)
Calcium: 8.1 mg/dL — ABNORMAL LOW (ref 8.9–10.3)
Chloride: 103 mmol/L (ref 98–111)
Creatinine, Ser: 0.69 mg/dL (ref 0.44–1.00)
GFR, Estimated: >60 mL/min (ref >60)
Glucose, Bld: 218 mg/dL — ABNORMAL HIGH (ref 70–99)
Potassium: 4.3 mmol/L (ref 3.5–5.1)
Sodium: 134 mmol/L — ABNORMAL LOW (ref 135–145)
Total Bilirubin: 0.7 mg/dL (ref 0.3–1.2)
Total Protein: 6.4 g/dL — ABNORMAL LOW (ref 6.5–8.1)

## 2020-02-04 LAB — MAGNESIUM: Magnesium: 1.5 mg/dL — ABNORMAL LOW (ref 1.7–2.4)

## 2020-02-04 LAB — HEMOGLOBIN A1C
Hgb A1c MFr Bld: 10.4 % — ABNORMAL HIGH (ref 4.8–5.6)
Mean Plasma Glucose: 251.78 mg/dL

## 2020-02-04 LAB — GLUCOSE, CAPILLARY
Glucose-Capillary: 168 mg/dL — ABNORMAL HIGH (ref 70–99)
Glucose-Capillary: 237 mg/dL — ABNORMAL HIGH (ref 70–99)
Glucose-Capillary: 159 mg/dL — ABNORMAL HIGH (ref 70–99)
Glucose-Capillary: 171 mg/dL — ABNORMAL HIGH (ref 70–99)
Glucose-Capillary: 199 mg/dL — ABNORMAL HIGH (ref 70–99)

## 2020-02-04 LAB — LACTATE DEHYDROGENASE: LDH: 187 U/L (ref 98–192)

## 2020-02-04 LAB — FERRITIN: Ferritin: 88 ng/mL (ref 11–307)

## 2020-02-04 LAB — ECHOCARDIOGRAM COMPLETE
Area-P 1/2: 5.73 cm2
Calc EF: 19 %
Height: 63 in
S' Lateral: 3.4 cm
Single Plane A2C EF: 18.3 %
Single Plane A4C EF: 20.5 %
Weight: 2640 oz

## 2020-02-04 LAB — TROPONIN I (HIGH SENSITIVITY)
Troponin I (High Sensitivity): 20 ng/L — ABNORMAL HIGH (ref <18)
Troponin I (High Sensitivity): 20 ng/L — ABNORMAL HIGH (ref <18)

## 2020-02-04 LAB — FIBRINOGEN: Fibrinogen: 532 mg/dL — ABNORMAL HIGH (ref 210–475)

## 2020-02-04 LAB — D-DIMER, QUANTITATIVE
D-Dimer, Quant: 0.31 ug/mL-FEU (ref 0.00–0.50)
D-Dimer, Quant: <0.27 ug/mL-FEU (ref 0.00–0.50)

## 2020-02-04 LAB — TSH: TSH: 2.436 u[IU]/mL (ref 0.350–4.500)

## 2020-02-04 LAB — C-REACTIVE PROTEIN: CRP: 2.7 mg/dL — ABNORMAL HIGH (ref <1.0)

## 2020-02-04 LAB — PROCALCITONIN: Procalcitonin: <0.1 ng/mL

## 2020-02-04 MED ORDER — INSULIN GLARGINE 100 UNIT/ML ~~LOC~~ SOLN
18.0000 [IU] | Freq: Every day | SUBCUTANEOUS | Status: DC
  Administered 2020-02-04: 18 [IU] via SUBCUTANEOUS
  Filled 2020-02-04 (×2): qty 0.18

## 2020-02-04 MED ORDER — OXYCODONE HCL 5 MG PO TABS: 5.0000 mg | ORAL_TABLET | ORAL | Status: DC | PRN

## 2020-02-04 MED ORDER — NYSTATIN 100000 UNIT/ML MT SUSP
5.0000 mL | Freq: Four times a day (QID) | OROMUCOSAL | Status: DC
  Administered 2020-02-04: 500000 [IU] via OROMUCOSAL
  Filled 2020-02-04: qty 5

## 2020-02-04 MED ORDER — METOPROLOL SUCCINATE ER 100 MG PO TB24
100.0000 mg | ORAL_TABLET | Freq: Every day | ORAL | Status: DC
  Administered 2020-02-04: 100 mg via ORAL
  Filled 2020-02-04: qty 1

## 2020-02-04 MED ORDER — ACETAMINOPHEN 325 MG PO TABS: 650.0000 mg | ORAL_TABLET | Freq: Four times a day (QID) | ORAL | Status: DC | PRN

## 2020-02-04 MED ORDER — NYSTATIN 100000 UNIT/ML MT SUSP: 5.0000 mL | Freq: Four times a day (QID) | OROMUCOSAL | Status: DC

## 2020-02-04 MED ORDER — METOPROLOL TARTRATE 5 MG/5ML IV SOLN
5.0000 mg | Freq: Once | INTRAVENOUS | Status: AC
  Administered 2020-02-04: 5 mg via INTRAVENOUS
  Filled 2020-02-04: qty 5

## 2020-02-04 MED ORDER — MAGNESIUM SULFATE 2 GM/50ML IV SOLN
2.0000 g | Freq: Once | INTRAVENOUS | Status: AC
  Administered 2020-02-04: 2 g via INTRAVENOUS
  Filled 2020-02-04: qty 50

## 2020-02-04 MED ORDER — AMLODIPINE BESYLATE 5 MG PO TABS: 2.5000 mg | ORAL_TABLET | Freq: Every day | ORAL | Status: DC

## 2020-02-04 MED ORDER — INSULIN ASPART 100 UNIT/ML ~~LOC~~ SOLN
0.0000 [IU] | Freq: Three times a day (TID) | SUBCUTANEOUS | Status: DC
  Administered 2020-02-04: 5 [IU] via SUBCUTANEOUS
  Administered 2020-02-04 (×2): 3 [IU] via SUBCUTANEOUS
  Administered 2020-02-05: 2 [IU] via SUBCUTANEOUS
  Administered 2020-02-05 – 2020-02-06 (×3): 3 [IU] via SUBCUTANEOUS
  Administered 2020-02-06: 5 [IU] via SUBCUTANEOUS
  Administered 2020-02-07 (×2): 2 [IU] via SUBCUTANEOUS

## 2020-02-04 MED ORDER — POLYETHYLENE GLYCOL 3350 17 G PO PACK: 17.0000 g | PACK | Freq: Every day | ORAL | Status: DC | PRN

## 2020-02-04 MED ORDER — ADULT MULTIVITAMIN W/MINERALS CH
1.0000 | ORAL_TABLET | Freq: Every day | ORAL | Status: DC
  Administered 2020-02-04 – 2020-02-05 (×2): 1 via ORAL
  Filled 2020-02-04 (×3): qty 1

## 2020-02-04 MED ORDER — SODIUM CHLORIDE 0.9 % IV SOLN
1.0000 g | INTRAVENOUS | Status: DC
  Administered 2020-02-04: 1 g via INTRAVENOUS
  Filled 2020-02-04: qty 1

## 2020-02-04 MED ORDER — ENALAPRIL MALEATE 5 MG PO TABS: 10.0000 mg | ORAL_TABLET | Freq: Every day | ORAL | Status: DC

## 2020-02-04 MED ORDER — INSULIN ASPART 100 UNIT/ML ~~LOC~~ SOLN: 0.0000 [IU] | Freq: Every day | SUBCUTANEOUS | Status: DC

## 2020-02-04 MED ORDER — ONDANSETRON HCL 4 MG/2ML IJ SOLN: 4.0000 mg | Freq: Four times a day (QID) | INTRAMUSCULAR | Status: DC | PRN

## 2020-02-04 MED ORDER — SODIUM CHLORIDE 0.9 % IV BOLUS
250.0000 mL | Freq: Once | INTRAVENOUS | Status: AC
  Administered 2020-02-04: 250 mL via INTRAVENOUS

## 2020-02-04 MED ORDER — PERFLUTREN LIPID MICROSPHERE
1.0000 mL | INTRAVENOUS | Status: AC | PRN
  Filled 2020-02-04: qty 10

## 2020-02-04 MED ORDER — LIVING WELL WITH DIABETES BOOK
Freq: Once | Status: AC
  Administered 2020-02-04: 1
  Filled 2020-02-04: qty 1

## 2020-02-04 MED ORDER — NYSTATIN 100000 UNIT/ML MT SUSP
5.0000 mL | Freq: Four times a day (QID) | OROMUCOSAL | Status: DC
  Administered 2020-02-04 – 2020-02-07 (×13): 500000 [IU] via ORAL
  Filled 2020-02-04 (×13): qty 5

## 2020-02-04 MED ORDER — APIXABAN 5 MG PO TABS
5.0000 mg | ORAL_TABLET | Freq: Two times a day (BID) | ORAL | Status: DC
  Administered 2020-02-04 – 2020-02-07 (×7): 5 mg via ORAL
  Filled 2020-02-04 (×7): qty 1

## 2020-02-04 MED ORDER — ONDANSETRON HCL 4 MG PO TABS: 4.0000 mg | ORAL_TABLET | Freq: Four times a day (QID) | ORAL | Status: DC | PRN

## 2020-02-04 NOTE — Progress Notes (Signed)
Went to evaluate the patient after noting heart rates in the 120s.  Found patient resting comfortably in bed.  She states she feels much better than when she initially came in.  Her heart rate was in the 120s and and was regular on exam and sinus tachycardia on the monitor (she initially presented in A. fib with RVR).  The patient denied any complaints.  Denied shortness of breath or chest pains.  She was noted to have dry mucous membranes and stated that she had not been drinking much fluids lately.  Patient without any crackles on auscultation of her lungs and no edema in her lower extremities.  I spoke to the patient about the results of her echo and her poor ejection fraction.  I recommended to the patient that we give her a small bolus of fluids to see if this improves her tachycardia since it was in sinus at the time of my interview with her.  I explained to her that we have to be careful with fluid resuscitation due to her heart function.  Patient expressed her understanding.  We will give small fluid bolus of 250 cc normal saline.  Will monitor to see if this improves her heart rate.  Her heart rate may simply be due to her COVID-19 infection, but with her dry mucous membranes I do feel that she would benefit from some fluids and this may assist with her heart rate as well.  I discussed with the patient to let us know if she develops any shortness of breath, chest pains, or other symptoms.

## 2020-02-04 NOTE — Progress Notes (Signed)
Staff from phlebotomist was unable to draw lab due to difficult IV access.  Phlebotomist will hand off to day shift team.  Filiberto Pinks, RN

## 2020-02-04 NOTE — Assessment & Plan Note (Signed)
Continue IV Rocephin. Urine cx obtained in ER.

## 2020-02-04 NOTE — Assessment & Plan Note (Signed)
Continue Toprol-XL.  Patient does not take any scheduled or as needed diuretics.  Update her echo.

## 2020-02-04 NOTE — H&P (Addendum)
History and Physical    [name] [name] SWN:[id number] DOB: Jan 29, 1950 DOA: 02/03/2020  PCP: Carollee Leitz, MD   Patient coming from: Home  I have personally briefly reviewed patient's old medical records in [place]  CC: weakness HPI: 70 year old African-American female with history of chronic atrial fibrillation, type 2 diabetes, chronic anticoagulation, history of chronic combined systolic and diastolic heart failure presented to the [place] emergency department due to weakness for about a week.  Patient's family has had Covid.  Patient was around her grandson who had Covid.  Patient has not had any shortness of breath.  She has had a couple bouts of diarrhea.  No fevers.  She has been vaccinated along with her booster for Covid.  Patient states that she has felt fatigued for about a week.  She has not been able to eat or drink very much.  She feels that she has had a coating on her tongue.  When she presented to the ER, she was noted to be tachycardic with heart rates greater than 100.  EKG demonstrated rapid atrial fibrillation.  Her Covid test was positive.  She was started on a Cardizem drip.  She was transferred to [facility] for further care.  On presentation to the floor, she was on a Cardizem drip at 15 mg/h.  Heart rate still elevated 115-120 bpm.  Patient is on Toprol-XL 100 mg daily for rate control.  Patient states that she takes her Eliquis twice a day.  She took her last dose yesterday(Sunday).   Patient denies any chest pain.  She is not dyspneic.    ED Course: Patient noted to have rapid A. fib in the ER.  Started on a Cardizem drip.  Transferred [facility]  Review of Systems:  Review of Systems  Constitutional: Positive for malaise/fatigue. Negative for chills and fever.       Anorexia for 1 week.  HENT:       Coating on her tongue  Eyes: Negative.   Respiratory: Negative.   Cardiovascular: Positive for palpitations.   Gastrointestinal: Positive for diarrhea. Negative for abdominal pain.       Only occasional diarrhea  Genitourinary: Negative.   Musculoskeletal: Negative.   Skin: Negative.   Neurological: Negative.   Endo/Heme/Allergies: Negative.   Psychiatric/Behavioral: Negative.   All other systems reviewed and are negative.   Past Medical History:  Diagnosis Date  . Abdominal pain   . Back pain   . Chronic combined systolic and diastolic CHF (congestive heart failure) ([place]) 02/04/2020  . Constipation   . Diabetes mellitus without complication ([place])   . Fatigue   . Full dentures   . Hemorrhoids   . Hypertension   . Lack of adequate sleep   . Liver mass   . Rash    on right breast  . Wears glasses     Past Surgical History:  Procedure Laterality Date  . BREAST DUCTAL SYSTEM EXCISION    . BREAST EXCISIONAL BIOPSY Left   . CARDIOVERSION N/A 07/31/2019   Procedure: CARDIOVERSION;  Surgeon: Pixie Casino, MD;  Location: [facility] ENDOSCOPY;  Service: Cardiovascular;  Laterality: N/A;  . CHOLECYSTECTOMY    . CHOLECYSTECTOMY, LAPAROSCOPIC  07/24/2010  . ESOPHAGEAL DILATION    . TEE WITHOUT CARDIOVERSION N/A 07/31/2019   Procedure: TRANSESOPHAGEAL ECHOCARDIOGRAM (TEE);  Surgeon: Pixie Casino, MD;  Location: [facility] ENDOSCOPY;  Service: Cardiovascular;  Laterality: N/A;     reports that she has never smoked.  She has never used smokeless tobacco. She reports that she does not drink alcohol and does not use drugs.  Allergies  Allergen Reactions  . Fruit & Vegetable Daily [Nutritional Supplements] Swelling and Other (See Comments)    All fruits cause tongue swelling and numbness Organic fruits are okay to eat  . Other Swelling    Tree nuts cause tongue swelling and numbness  . Peanut-Containing Drug Products Swelling and Other (See Comments)    Tongue swelling and numbness  . Aspirin Nausea And Vomiting and Other (See Comments)    Cannot take due to liver issues  . Tylenol [Acetaminophen]  Other (See Comments)    Cannot take due to liver issues    Family History  Problem Relation Age of Onset  . Stroke Mother   . Other Father        broken heart after spouse spouse  . Diabetes Sister   . Hypertension Sister   . Diabetes Sister   . Multiple sclerosis Sister   . Alcohol abuse Other   . Diabetes Other   . Breast cancer Neg Hx     Prior to Admission medications   Medication Sig Start Date End Date Taking? Authorizing Provider  amLODipine (NORVASC) 2.5 MG tablet Take 1 tablet (2.5 mg total) by mouth at bedtime. 11/14/19   Leavy Cella, RPH-CPP  Blood Pressure KIT Monitor blood pressure twice a day 09/28/19   Carollee Leitz, MD  docusate sodium (COLACE) 100 MG capsule Take 100 mg by mouth daily.    [provider]  ELIQUIS 5 MG TABS tablet Take 5 mg by mouth 2 (two) times daily. 09/28/19   [provider]  enalapril (VASOTEC) 5 MG tablet Take 2 tablets (10 mg total) by mouth at bedtime. 11/14/19   Leavy Cella, RPH-CPP  glucose blood (ACCU-CHEK AVIVA) test strip Use as instructed 06/16/17   Zenia Resides, MD  insulin glargine (LANTUS) 100 UNIT/ML Solostar Pen Inject 18 Units into the skin at bedtime. 08/09/19   Leavy Cella, RPH-CPP  Insulin Pen Needle (PEN NEEDLES 31GX5/16") 31G X 8 MM MISC 1 each by Does not apply route at bedtime. Use to inject Lantus Solostart insulin pen 11/09/19   Carollee Leitz, MD  Lancets (ACCU-CHEK SOFT [facility]) lancets Once daily testing. 06/16/17   Zenia Resides, MD  Magnesium Oxide 400 MG CAPS Take 1 capsule (400 mg total) by mouth daily. 08/17/19   Richardson Dopp T, PA-C  metoprolol succinate (TOPROL-XL) 100 MG 24 hr tablet Take 1 tablet (100 mg total) by mouth daily. Take with or immediately following a meal. 08/17/19   Weaver, Scott T, PA-C  Semaglutide,0.25 or 0.5MG /DOS, (OZEMPIC, 0.25 OR 0.5 MG/DOSE,) 2 MG/1.5ML SOPN Inject 0.5 mg into the skin once a week. 11/14/19   Leavy Cella, RPH-CPP    Physical Exam: Vitals:    02/03/20 2215 02/03/20 2317 02/04/20 0000 02/04/20 0137  BP:  104/70 101/80 (!) 137/105  Pulse: (!) 122 (!) 120 93 61  Resp: (!) 26 (!) 30 (!) 25 (!) 24  Temp:    98.7 F (37.1 C)  TempSrc:    Oral  SpO2: 96% 95% 96% 98%  Weight:      Height:        Physical Exam Vitals and nursing note reviewed.  Constitutional:      General: She is not in acute distress.    Appearance: She is normal weight. She is not ill-appearing, toxic-appearing or diaphoretic.  HENT:  Head: Normocephalic and atraumatic.  Cardiovascular:     Rate and Rhythm: Tachycardia present. Rhythm irregular.  Pulmonary:     Effort: Pulmonary effort is normal. No tachypnea, accessory muscle usage or respiratory distress.     Breath sounds: Normal breath sounds. No decreased breath sounds, wheezing, rhonchi or rales.  Chest:     Chest wall: No tenderness.  Abdominal:     General: Bowel sounds are normal.     Palpations: Abdomen is soft. There is no hepatomegaly or mass.     Tenderness: There is no abdominal tenderness. There is no guarding or rebound.  Musculoskeletal:     Right lower leg: No edema.     Left lower leg: No edema.  Skin:    General: Skin is warm and dry.     Capillary Refill: Capillary refill takes less than 2 seconds.  Neurological:     General: No focal deficit present.     Mental Status: She is alert and oriented to person, place, and time.      Labs on Admission: I have personally reviewed following labs and imaging studies  CBC: Recent Labs  Lab 02/03/20 1445  WBC 6.8  NEUTROABS 2.1  HGB 13.9  HCT 44.4  MCV 81.3  PLT 850   Basic Metabolic Panel: Recent Labs  Lab 02/03/20 1445  NA 138  K 4.2  CL 103  CO2 27  GLUCOSE 133*  BUN 15  CREATININE 0.92  CALCIUM 8.4*   GFR: Estimated Creatinine Clearance: 56.7 mL/min (by C-G formula based on SCr of 0.92 mg/dL). Liver Function Tests: Recent Labs  Lab 02/03/20 1445  AST 50*  ALT 73*  ALKPHOS 91  BILITOT 0.4  PROT 7.2   ALBUMIN 2.8*   No results for input(s): LIPASE, AMYLASE in the last 168 hours. No results for input(s): AMMONIA in the last 168 hours. Coagulation Profile: No results for input(s): INR, PROTIME in the last 168 hours. Cardiac Enzymes: No results for input(s): CKTOTAL, CKMB, CKMBINDEX, TROPONINI in the last 168 hours. BNP (last 3 results) No results for input(s): PROBNP in the last 8760 hours. HbA1C: No results for input(s): HGBA1C in the last 72 hours. CBG: Recent Labs  Lab 02/03/20 1904 02/04/20 0143  GLUCAP 132* 168*   Lipid Profile: No results for input(s): CHOL, HDL, LDLCALC, TRIG, CHOLHDL, LDLDIRECT in the last 72 hours. Thyroid Function Tests: No results for input(s): TSH, T4TOTAL, FREET4, T3FREE, THYROIDAB in the last 72 hours. Anemia Panel: No results for input(s): VITAMINB12, FOLATE, FERRITIN, TIBC, IRON, RETICCTPCT in the last 72 hours. Urine analysis:    Component Value Date/Time   COLORURINE AMBER (A) 02/03/2020 1642   APPEARANCEUR CLOUDY (A) 02/03/2020 1642   LABSPEC 1.030 02/03/2020 1642   PHURINE 5.0 02/03/2020 1642   GLUCOSEU NEGATIVE 02/03/2020 1642   HGBUR TRACE (A) 02/03/2020 1642   BILIRUBINUR NEGATIVE 02/03/2020 1642   KETONESUR NEGATIVE 02/03/2020 1642   PROTEINUR 100 (A) 02/03/2020 1642   UROBILINOGEN 1.0 10/09/2014 1605   NITRITE NEGATIVE 02/03/2020 1642   LEUKOCYTESUR SMALL (A) 02/03/2020 1642    Radiological Exams on Admission: I have personally reviewed images Portable chest 1 View  Result Date: 02/04/2020 CLINICAL DATA:  Shortness of breath EXAM: PORTABLE CHEST 1 VIEW COMPARISON:  February 03, 2020 FINDINGS: There is persistent cardiomegaly. There is no pneumothorax or large pleural effusion. There are findings are suspicious for air bronchograms in the retrocardiac region. There is no acute osseous abnormality. IMPRESSION: Cardiomegaly with findings suspicious for retrocardiac airspace  disease. Electronically Signed   By: Constance Holster  M.D.   On: 02/04/2020 02:14   DG Chest Port 1 View  Result Date: 02/03/2020 CLINICAL DATA:  Shortness of breath. EXAM: PORTABLE CHEST 1 VIEW COMPARISON:  Chest radiograph 07/26/2019. FINDINGS: Stable enlarged cardiac and mediastinal contours. No consolidative pulmonary opacities. No pleural effusion or pneumothorax. Osseous structures unremarkable. IMPRESSION: No active disease. Electronically Signed   By: Lovey Newcomer M.D.   On: 02/03/2020 12:14    EKG: I have personally reviewed EKG: shows rapid afib   Assessment:  70 yo AAF admitted for rapid atrial fibrillation, also incidentally has covid-10  Assessment/Plan Principal Problem:   Atrial fibrillation with RVR (HCC) Active Problems:   COVID-19 virus infection   Uncontrolled type 2 diabetes mellitus with hyperglycemia (HCC)   Chronic anticoagulation - on Eliquis for chronic afib   Oral thrush   Chronic combined systolic and diastolic CHF (congestive heart failure) ([place])   Acute cystitis    Atrial fibrillation with RVR ([place]) Admit to cardiac telemetry.  Continue IV Cardizem.  Patient will continue her Toprol-XL.  We will hold her Vasotec and her amlodipine in case her Toprol-XL dose needs to be increased and/or p.o. Cardizem added.  We will update her echo.  We will check a TSH. continue Eliquis for CVA prophylaxis.Marland Kitchen  COVID-19 virus infection Patient is currently asymptomatic.  She has had both Covid vaccines plus the booster.  No indication for steroids.  Patient is on room air.  Uncontrolled type 2 diabetes mellitus with hyperglycemia (HCC) Check A1c.  Patient placed on sliding scale insulin.  Continue Lantus.  Chronic anticoagulation - on Eliquis for chronic afib Continue Eliquis.  Oral thrush Start oral nystatin swish and spit 4 times a day.  Chronic combined systolic and diastolic CHF (congestive heart failure) (HCC) Continue Toprol-XL.  Patient does not take any scheduled or as needed diuretics.  Update her  echo.  Acute cystitis Continue IV Rocephin. Urine cx obtained in ER.   DVT prophylaxis: Eliquis Code Status: Full Code Family Communication: no family at bedside  Disposition Plan: DC to home after HR controlled  Consults called: none  Admission status: Observation, Telemetry bed   Kristopher Oppenheim, DO [facility] 02/04/2020, 2:37 AM

## 2020-02-04 NOTE — Hospital Course (Addendum)
[name] [name] presented to [facility] with 1 week of generalized fatigue/weakness. She was found to be COVID+ and in AFib with RVR and HR from the 120s-140s. She came to the [facility] and was admitted to the family medicine teaching service. Her COVID did not require any supplemental oxygen and she was not deemed a candidate for remdesivir given the duration of her symptoms.  For her Afib with RVR, she was treated with diltiazem, but once found to have worsening of her heart failure (LVEF 25-30%), she was transitioned to metoprolol and amiodarone for rate control. Cardiology was consulted and recommended cardioversion, which she adamantly refused. We increased her home metoprolol dose and achieved adequate rate control on an amiodarone drip and she was transitioned to PO. She was able to maintain rates <110 on PO and was discharged home on the following meds for rate control: -Metoprolol Succinate 200mg  daily (take 100mg  with breakfast and 100mg  with dinner per patient preference) -Amiodarone 200mg  BID until 2/10 then 200mg  once daily. -Eliquis 5mg  BID  Items for PCP Follow-up: 1) Her A1c on admission was 10.4 despite adherence to her home regimen of Lantus and Ozempic. Recommend outpatient optimization of this regimen. 2) Blood pressures have been persistently elevated to 150s systolic. Patient reports not taking her home enalapril or amlodipine. She refused any further intervention in the hospital. Outpatient optimization recommended.  3) Please note the dosing change in her amiodarone after 2/5. She should go from taking 200mg  BID to taking 200mg  once daily.  4) Patient was found to have oral thrush on admission, was treated with Nystatin rinse. It is likely that this was related to her uncontrolled diabetes, but monitor for signs of immunosupression. HIV neg in July 2021. Not sexually active, no IVDU.

## 2020-02-04 NOTE — ED Notes (Signed)
Spoke to pt daughter per pt request, updated on transfer status and gave room number and phone number for unit; also updated floor that pt was on route with [facility]

## 2020-02-04 NOTE — Assessment & Plan Note (Signed)
-   Continue Eliquis 

## 2020-02-04 NOTE — Assessment & Plan Note (Signed)
Start oral nystatin swish and spit 4 times a day.

## 2020-02-04 NOTE — Assessment & Plan Note (Signed)
Patient is currently asymptomatic.  She has had both Covid vaccines plus the booster.  No indication for steroids.  Patient is on room air.

## 2020-02-04 NOTE — Assessment & Plan Note (Signed)
Check A1c.  Patient placed on sliding scale insulin.  Continue Lantus.

## 2020-02-04 NOTE — Progress Notes (Signed)
Received signout from Dr. Carollee Herter with [facility].  In brief, this is a 70 year old female with a history of A. fib (on anticoagulation with apixaban and on metoprolol for rate control), T2DM, HTN presenting with 1 week of generalized weakness, diarrhea, and dyspnea in the setting of known exposure to Covid (grandson tested positive for Covid last week).  She initially presented to med [facility] ED and was found to be positive for Covid (has been stable on room air) as well as A. fib with RVR with rate in the 120s to 140s.  She has been started on diltiazem drip and ordered for IV metoprolol tartrate 5 mg (not yet given) with plan to continue her home metoprolol succinate 100 mg.  Her home amlodipine and enalapril have been held to allow room for rate control.  TTE has been ordered for her as well.  For diabetes, she has been ordered for Lantus 18 units nightly and moderate sliding scale insulin.  Overall, she is primarily admitted for A. fib in RVR with incidental/mild COVID-19 infection.  She will likely be discharged once her rate is under control.  FPTS to resume care at 7 AM.

## 2020-02-04 NOTE — Subjective & Objective (Signed)
CC: weakness HPI: 70 year old African-American female with history of chronic atrial fibrillation, type 2 diabetes, chronic anticoagulation, history of chronic combined systolic and diastolic heart failure presented to the med [facility] emergency department due to weakness for about a week.  Patient's family has had Covid.  Patient was around her grandson who had Covid.  Patient has not had any shortness of breath.  She has had a couple bouts of diarrhea.  No fevers.  She has been vaccinated along with her booster for Covid.  Patient states that she has felt fatigued for about a week.  She has not been able to eat or drink very much.  She feels that she has had a coating on her tongue.  When she presented to the ER, she was noted to be tachycardic with heart rates greater than 100.  EKG demonstrated rapid atrial fibrillation.  Her Covid test was positive.  She was started on a Cardizem drip.  She was transferred to [facility] for further care.  On presentation to the floor, she was on a Cardizem drip at 15 mg/h.  Heart rate still elevated 115-120 bpm.  Patient is on Toprol-XL 100 mg daily for rate control.  Patient states that she takes her Eliquis twice a day.  She took her last dose yesterday(Sunday).   Patient denies any chest pain.  She is not dyspneic.

## 2020-02-04 NOTE — Progress Notes (Signed)
  Echocardiogram 2D Echocardiogram has been performed.  Janalyn Harder 02/04/2020, 10:13 AM

## 2020-02-04 NOTE — Assessment & Plan Note (Signed)
Admit to cardiac telemetry.  Continue IV Cardizem.  Patient will continue her Toprol-XL.  We will hold her Vasotec and her amlodipine in case her Toprol-XL dose needs to be increased and/or p.o. Cardizem added.  We will update her echo.  We will check a TSH. continue Eliquis for CVA prophylaxis.Marland Kitchen

## 2020-02-04 NOTE — Progress Notes (Signed)
Pt transferred from [facility] to [facility] 4E17 by [facility]. Pt appeared alert and oriented x 4, neurological intact, ambulated independently in room. No distress at arrival.  Atrial fibrillation with RVR on monitor, HR 126, BP 137/105 mmHg. Denied chest pain. Room air SPO2 98%.,RR 16-18. Denied SOB or respiratory distress. Bilateral lung clear auscultated. On Cardizem gtt at 15 mg/hr. Dr. Imogene Burn on-call provider assessed Pt at bedside.  CCMD called for EKG verification, CHG bath given, room oriented, cal bell within reach. Pt satisfied with care. We will continue to monitor.  Filiberto Pinks, RN

## 2020-02-04 NOTE — Telephone Encounter (Signed)
Called to discuss with patient about COVID-19 symptoms and the use of one of the available treatments for those with mild to moderate Covid symptoms and at a high risk of hospitalization.  Pt appears to qualify for outpatient treatment due to co-morbid conditions and/or a member of an at-risk group in accordance with the FDA Emergency Use Authorization.    Patient has been admitted with Covid symptoms.  [name] [name]

## 2020-02-04 NOTE — Progress Notes (Addendum)
Family Medicine Teaching Service Daily Progress Note Intern Pager: [phone]  Patient name: [name] [name] Medical record number: [id number] Date of birth: 27-Mar-1950 Age: 70 y.o. Gender: female  Primary Care Provider: Dana Allan, MD Consultants: None Code Status: Full  Pt Overview and Major Events to Date:  1/24- Admitted  Assessment and Plan: Ms. Joswick is a 70yo female with a history of Afib, T2DM, HFrEF who presents with 1 week history of weakness.    A Fib w/ RVR  HFrEF Good rate control today. Pulse rate measured manually 72. Will monitor off diltiazem gtt. Restarted home metoprolol. Echo today with EF 25-30%, decreased compared to 40-45% in July. Consider COVID cardiomyopathy as a contributing factor to her RVR and acutely decreased EF.  - D/c Diltizem gtt  - Continue home metoprolol succinate 100mg  daily - Continue Eliquis  - Consider HH services on discharge due to poor medication adherence   COVID+: CXR suspicious for retrocardiac airspace disease.  Breathing comfortably on RA. This is likely the primary cause of her fatigue. Given her 1-week history of symptoms, she is outside of the window for Remdesivir therapy.  - No further intervention at this time  Hypomagnesemia:  Mag 1.5 today. - 2g IV mag today  Poorly-controlled DM2: Glucoses overnight 132-237. Received 5u Novolog. On semaglutide 0.25 mg weekly at home. A1c 10.4. - Lantus 18u qhs - SSI - PCP follow-up for inadequate control   Asymptomatic Bacteruria:  Pt. Has remained afebrile. No suprapubic tenderness. Pt denies any dysuria. Receiving CTX. Urine culture pending.  - D/c antibiotics as patient is asymptomatic  Oral Thrush: Mild thrush of tongue. Likely 2/2 poorly controlled diabetes. HIV negative in July. Not sexually active. No IVDU.  - Continue Nystatin rinse   FEN/GI: Heart healthy PPx: On chronic Eliquis for AFib   Status is: Observation  The patient remains OBS appropriate and will d/c  before 2 midnights.  Dispo: The patient is from: Home              Anticipated d/c is to: Home              Anticipated d/c date is: 1 day              Patient currently is not medically stable to d/c.   Difficult to place patient No    Subjective:  Ms. Lenart reports feeling "better" today. She denies any SOB, CP today. She denies any palpitations. She is requesting that we keep her daughter up to date on her care.  On further questioning about her COVID symptoms, it seems that she has been sick for about a week, and that her nephew just tested positive a few days ago.   Objective: Temp:  [98.7 F (37.1 C)-99.8 F (37.7 C)] 98.7 F (37.1 C) (01/24 0600) Pulse Rate:  [51-133] 104 (01/24 0600) Resp:  [14-32] 21 (01/24 0600) BP: (101-168)/(70-133) 130/108 (01/24 0600) SpO2:  [95 %-100 %] 97 % (01/24 0600) Weight:  [74.8 kg] 74.8 kg (01/23 1053) Physical Exam: General: Well-appearing, comfortably sitting up in bed eating breakfast Cardiovascular: Irregularly regular rhythm, normal rate, no murmurs appreciated Respiratory: Lungs CTAB, normal WOB on RA Abdomen: Soft, no suprapubic tenderness, no ascites or organomegaly Extremities: No LE edema, all extremities warm and well-perfused  Laboratory: Recent Labs  Lab 02/03/20 1445  WBC 6.8  HGB 13.9  HCT 44.4  PLT 233   Recent Labs  Lab 02/03/20 1445  NA 138  K 4.2  CL 103  CO2 27  BUN 15  CREATININE 0.92  CALCIUM 8.4*  PROT 7.2  BILITOT 0.4  ALKPHOS 91  ALT 73*  AST 50*  GLUCOSE 133*    Imaging/Diagnostic Tests: Echocardiogram 02/04/2020 1. Since the last study on 07/27/2019 LVEF has decreased, now severely  impaired at 25-30% and diffuse hypokinesis. Severe concentric LVH. RVEF is  at least moderately decreased. Evaluation for infiltrative  cardiomyopathies such as cardiac amyloidosis  should be considered.  2. Left ventricular ejection fraction, by estimation, is 25 to 30%. The  left ventricle has severely  decreased function. The left ventricle has no  regional wall motion abnormalities. There is severe concentric left  ventricular hypertrophy. Left  ventricular diastolic function could not be evaluated.  3. Right ventricular systolic function is moderately reduced. The right  ventricular size is moderately enlarged. There is moderately elevated  pulmonary artery systolic pressure. The estimated right ventricular  systolic pressure is 50.3 mmHg.  4. Left atrial size was mildly dilated.  5. The mitral valve is normal in structure. Mild mitral valve  regurgitation. No evidence of mitral stenosis.  6. Tricuspid valve regurgitation is moderate to severe.  7. The aortic valve is normal in structure. Aortic valve regurgitation is  not visualized. Mild to moderate aortic valve sclerosis/calcification is  present, without any evidence of aortic stenosis.  8. The inferior vena cava is normal in size with greater than 50%  respiratory variability, suggesting right atrial pressure of 3 mmHg.   FINDINGS  Left Ventricle: Left ventricular ejection fraction, by estimation, is 25  to 30%. The left ventricle has severely decreased function. The left  ventricle has no regional wall motion abnormalities. Definity contrast  agent was given IV to delineate the left  ventricular endocardial borders. The left ventricular internal cavity  size was normal in size. There is severe concentric left ventricular  hypertrophy. Left ventricular diastolic function could not be evaluated  due to atrial fibrillation. Left  ventricular diastolic function could not be evaluated.   Right Ventricle: The right ventricular size is moderately enlarged. No  increase in right ventricular wall thickness. Right ventricular systolic  function is moderately reduced. There is moderately elevated pulmonary  artery systolic pressure. The tricuspid  regurgitant velocity is 2.97 m/s, and with an assumed right atrial  pressure  of 15 mmHg, the estimated right ventricular systolic pressure is  50.3 mmHg.   Left Atrium: Left atrial size was mildly dilated.   Right Atrium: Right atrial size was normal in size.   Pericardium: Trivial pericardial effusion is present.   Mitral Valve: The mitral valve is normal in structure. Mild mitral valve  regurgitation. No evidence of mitral valve stenosis.   Tricuspid Valve: The tricuspid valve is normal in structure. Tricuspid  valve regurgitation is moderate to severe. No evidence of tricuspid  stenosis.   Aortic Valve: The aortic valve is normal in structure. Aortic valve  regurgitation is not visualized. Mild to moderate aortic valve  sclerosis/calcification is present, without any evidence of aortic  stenosis.   Pulmonic Valve: The pulmonic valve was normal in structure. Pulmonic valve  regurgitation is mild. No evidence of pulmonic stenosis.   Aorta: The aortic root is normal in size and structure.   Venous: The inferior vena cava is normal in size with greater than 50%  respiratory variability, suggesting right atrial pressure of 3 mmHg.   IAS/Shunts: No atrial level shunt detected by color flow Doppler.   Dorothyann Gibbs, Medical Student 02/04/2020, 6:46 AM  Resident attestation: I agree with the documentation of Student Dr. Marisue Humble above. I have made adjustments to his note as appropriate. I have seen the patient and performed physical exam on the patient consistent with his documented physical exam above.  Jackelyn Poling, DO

## 2020-02-05 DIAGNOSIS — I5042 Chronic combined systolic (congestive) and diastolic (congestive) heart failure: Secondary | ICD-10-CM

## 2020-02-05 DIAGNOSIS — I484 Atypical atrial flutter: Secondary | ICD-10-CM | POA: Diagnosis not present

## 2020-02-05 DIAGNOSIS — U071 COVID-19: Secondary | ICD-10-CM

## 2020-02-05 LAB — COMPREHENSIVE METABOLIC PANEL
ALT: 51 U/L — ABNORMAL HIGH (ref 0–44)
AST: 32 U/L (ref 15–41)
Albumin: 2.2 g/dL — ABNORMAL LOW (ref 3.5–5.0)
Alkaline Phosphatase: 75 U/L (ref 38–126)
Anion gap: 8 (ref 5–15)
BUN: 14 mg/dL (ref 8–23)
CO2: 23 mmol/L (ref 22–32)
Calcium: 8.3 mg/dL — ABNORMAL LOW (ref 8.9–10.3)
Chloride: 102 mmol/L (ref 98–111)
Creatinine, Ser: 0.75 mg/dL (ref 0.44–1.00)
GFR, Estimated: >60 mL/min (ref >60)
Glucose, Bld: 165 mg/dL — ABNORMAL HIGH (ref 70–99)
Potassium: 4.2 mmol/L (ref 3.5–5.1)
Sodium: 133 mmol/L — ABNORMAL LOW (ref 135–145)
Total Bilirubin: 0.8 mg/dL (ref 0.3–1.2)
Total Protein: 6 g/dL — ABNORMAL LOW (ref 6.5–8.1)

## 2020-02-05 LAB — CBC WITH DIFFERENTIAL/PLATELET
Abs Immature Granulocytes: 0.01 10*3/uL (ref 0.00–0.07)
Basophils Absolute: 0 10*3/uL (ref 0.0–0.1)
Basophils Relative: 0 %
Eosinophils Absolute: 0.2 10*3/uL (ref 0.0–0.5)
Eosinophils Relative: 4 %
HCT: 40.1 % (ref 36.0–46.0)
Hemoglobin: 12.9 g/dL (ref 12.0–15.0)
Immature Granulocytes: 0 %
Lymphocytes Relative: 35 %
Lymphs Abs: 1.8 10*3/uL (ref 0.7–4.0)
MCH: 25.9 pg — ABNORMAL LOW (ref 26.0–34.0)
MCHC: 32.2 g/dL (ref 30.0–36.0)
MCV: 80.5 fL (ref 80.0–100.0)
Monocytes Absolute: 0.4 10*3/uL (ref 0.1–1.0)
Monocytes Relative: 8 %
Neutro Abs: 2.7 10*3/uL (ref 1.7–7.7)
Neutrophils Relative %: 53 %
Platelets: 188 10*3/uL (ref 150–400)
RBC: 4.98 MIL/uL (ref 3.87–5.11)
RDW: 14.5 % (ref 11.5–15.5)
WBC: 5 10*3/uL (ref 4.0–10.5)
nRBC: 0 % (ref 0.0–0.2)

## 2020-02-05 LAB — GLUCOSE, CAPILLARY
Glucose-Capillary: 155 mg/dL — ABNORMAL HIGH (ref 70–99)
Glucose-Capillary: 137 mg/dL — ABNORMAL HIGH (ref 70–99)
Glucose-Capillary: 175 mg/dL — ABNORMAL HIGH (ref 70–99)
Glucose-Capillary: 153 mg/dL — ABNORMAL HIGH (ref 70–99)

## 2020-02-05 LAB — URINE CULTURE

## 2020-02-05 MED ORDER — METOPROLOL SUCCINATE ER 50 MG PO TB24
150.0000 mg | ORAL_TABLET | Freq: Every day | ORAL | Status: DC
  Administered 2020-02-05 – 2020-02-07 (×3): 150 mg via ORAL
  Filled 2020-02-05 (×3): qty 3

## 2020-02-05 MED ORDER — METOPROLOL TARTRATE 5 MG/5ML IV SOLN
5.0000 mg | Freq: Once | INTRAVENOUS | Status: AC
  Administered 2020-02-05: 5 mg via INTRAVENOUS
  Filled 2020-02-05: qty 5

## 2020-02-05 MED ORDER — INSULIN GLARGINE 100 UNIT/ML ~~LOC~~ SOLN
18.0000 [IU] | Freq: Every day | SUBCUTANEOUS | Status: DC
  Administered 2020-02-05 – 2020-02-07 (×3): 18 [IU] via SUBCUTANEOUS
  Filled 2020-02-05 (×3): qty 0.18

## 2020-02-05 NOTE — Consult Note (Signed)
Cardiology Consultation:   Patient ID: [name] [name] MRN: [id number]; DOB: [birth year]  Admit date: 02/03/2020 Date of Consult: 02/05/2020  Primary Care Provider: Carollee Leitz, MD [facility] Cardiologist: Mertie Moores, MD  [place] Electrophysiologist:  None    Patient Profile:   [name] [name] is a 70 y.o. female with a hx of PAF (TEE/DCCV 7/21), sick sinus syndrome, Chronic systolic and diastolic CHF, HTN, HLD, and DM who is being seen today for the evaluation of CHF and Afib at the request of Dr. Gwendlyn Deutscher.  History of Present Illness:   [name] [name] is a 70 yo female with PMH noted above. She has been follow by Dr. Acie Fredrickson as an outpatient. She has a hx of PAF first noted 7/21 with associated dilated cardiomyopathy EF 40-45%.  Her heart rate was difficult to control, but AV nodal blocking agents were unable to be increased due to episodes of bradycardia and short pauses.  She underwent TEE/DCCV with restoration of sinus rhythm and improved symptoms.  She was seen in the office for post hospital follow-up and her Delene Loll was increased with plans to start spironolactone at her follow-up visit.  At her follow-up visit on 09/19/2019 it was noted that her blood pressure was significantly elevated and it was unclear which medication she was actually taking.  She informed that she was no longer taking her Entresto as she was concerned of side effects.  Also concerned that she was not taking her metoprolol.  She was asked to resume if she had stopped.  She was continued off Entresto and placed on valsartan 160 mg daily.  It was also unclear if she was taking her apixaban.  It was reinforced that she should resume this.  Plan was to repeat an echocardiogram when she was back on guideline directed medical therapy.  Of note outpatient sleep study was ordered.  She presented to [place] emergency department on 1/24 with complaints of weakness for about 1 week.  Reported that most  of her family member tested positive for Covid.  She had not had any significant shortness of breath but has had several bouts of diarrhea.  No fevers.  Main complaint was fatigue, and stated she had been unable to eat or drink much over the past couple days prior to admission 2/2 to thrush.   In the ER she was noted to be tachycardic with labs notable for sodium 134, creatinine 0.69, magnesium 1.5, WBC 6.8, hemoglobin 13.9, dimer 0.31, hemoglobin A1c 10.4, TSH 2.436.  Patient actually has more of an atrial flutter appearance heart rate 143 bpm on admission EKG.  She was started on diltiazem drip and continued on Eliquis.  Positive for Covid on admission though asymptomatic.  Admitted to family medicine teaching service for further management.  Echocardiogram this admission showed decline in EF to 25-30% with diffuse hypokinesis.  Severe LVH, RV EF was at least moderately decreased.  Mildly dilated left atrium.  Her diltiazem drip was stopped and transitioned to Toprol XL which was increased to 150 mg.    Past Medical History:  Diagnosis Date  . Abdominal pain   . Back pain   . Chronic combined systolic and diastolic CHF (congestive heart failure) ([place]) 02/04/2020  . Constipation   . Diabetes mellitus without complication ([place])   . Fatigue   . Full dentures   . Hemorrhoids   . Hypertension   . Lack of adequate sleep   . Liver mass   . Rash  on right breast  . Wears glasses     Past Surgical History:  Procedure Laterality Date  . BREAST DUCTAL SYSTEM EXCISION    . BREAST EXCISIONAL BIOPSY Left   . CARDIOVERSION N/A 07/31/2019   Procedure: CARDIOVERSION;  Surgeon: Pixie Casino, MD;  Location: [facility] ENDOSCOPY;  Service: Cardiovascular;  Laterality: N/A;  . CHOLECYSTECTOMY    . CHOLECYSTECTOMY, LAPAROSCOPIC  07/24/2010  . ESOPHAGEAL DILATION    . TEE WITHOUT CARDIOVERSION N/A 07/31/2019   Procedure: TRANSESOPHAGEAL ECHOCARDIOGRAM (TEE);  Surgeon: Pixie Casino, MD;  Location: [facility]  ENDOSCOPY;  Service: Cardiovascular;  Laterality: N/A;     Home Medications:  Prior to Admission medications   Medication Sig Start Date End Date Taking? Authorizing Provider  docusate sodium (COLACE) 100 MG capsule Take 100 mg by mouth daily.   Yes [provider]  ELIQUIS 5 MG TABS tablet Take 5 mg by mouth 2 (two) times daily. 09/28/19  Yes [provider]  insulin glargine (LANTUS) 100 UNIT/ML Solostar Pen Inject 18 Units into the skin at bedtime. Patient taking differently: Inject 20 Units into the skin every evening. 08/09/19  Yes Leavy Cella, RPH-CPP  Magnesium Oxide 400 MG CAPS Take 1 capsule (400 mg total) by mouth daily. 08/17/19  Yes Weaver, Scott T, PA-C  metoprolol succinate (TOPROL-XL) 100 MG 24 hr tablet Take 1 tablet (100 mg total) by mouth daily. Take with or immediately following a meal. 08/17/19  Yes Weaver, Scott T, PA-C  Semaglutide,0.25 or 0.5MG /DOS, (OZEMPIC, 0.25 OR 0.5 MG/DOSE,) 2 MG/1.5ML SOPN Inject 0.5 mg into the skin once a week. Patient taking differently: Inject 0.5 mg into the skin every Monday. 11/14/19  Yes Leavy Cella, RPH-CPP  amLODipine (NORVASC) 2.5 MG tablet Take 1 tablet (2.5 mg total) by mouth at bedtime. Patient not taking: Reported on 02/04/2020 11/14/19   Leavy Cella, RPH-CPP  Blood Pressure KIT Monitor blood pressure twice a day 09/28/19   Carollee Leitz, MD  enalapril (VASOTEC) 5 MG tablet Take 2 tablets (10 mg total) by mouth at bedtime. Patient not taking: Reported on 02/04/2020 11/14/19   Leavy Cella, RPH-CPP  glucose blood (ACCU-CHEK AVIVA) test strip Use as instructed 06/16/17   Zenia Resides, MD  Insulin Pen Needle (PEN NEEDLES 31GX5/16") 31G X 8 MM MISC 1 each by Does not apply route at bedtime. Use to inject Lantus Solostart insulin pen 11/09/19   Carollee Leitz, MD  Lancets (ACCU-CHEK SOFT [facility]) lancets Once daily testing. 06/16/17   Zenia Resides, MD    Inpatient Medications: Scheduled Meds: . apixaban  5 mg Oral  BID  . insulin aspart  0-15 Units Subcutaneous TID WC  . insulin aspart  0-5 Units Subcutaneous QHS  . insulin glargine  18 Units Subcutaneous Daily  . metoprolol succinate  150 mg Oral Daily  . multivitamin with minerals  1 tablet Oral Daily  . nystatin  5 mL Oral QID   Continuous Infusions: . sodium chloride Stopped (02/03/20 1959)   PRN Meds: sodium chloride, acetaminophen, ondansetron **OR** ondansetron (ZOFRAN) IV, polyethylene glycol  Allergies:    Allergies  Allergen Reactions  . Fruit & Vegetable Daily [Nutritional Supplements] Swelling and Other (See Comments)    All fruits cause tongue swelling and numbness Organic fruits are okay to eat  . Other Swelling    Tree nuts cause tongue swelling and numbness  . Peanut-Containing Drug Products Swelling and Other (See Comments)    Tongue swelling and numbness  .  Aspirin Nausea And Vomiting and Other (See Comments)    Cannot take due to liver issues Other reaction(s): Unknown  . Tylenol [Acetaminophen] Other (See Comments)    Cannot take due to liver issues    Social History:   Social History   Socioeconomic History  . Marital status: Single    Spouse name: Not on file  . Number of children: 3  . Years of education: Not on file  . Highest education level: High school graduate  Occupational History  . Occupation: Retired    Fish farm manager: OTHER    Comment: CNA  Tobacco Use  . Smoking status: Never Smoker  . Smokeless tobacco: Never Used  Vaping Use  . Vaping Use: Never used  Substance and Sexual Activity  . Alcohol use: No    Alcohol/week: 0.0 standard drinks  . Drug use: No  . Sexual activity: Not Currently  Other Topics Concern  . Not on file  Social History Narrative   Patient lives alone in an apartment complex for seniors.   Patient does not drive. She walks to surrounding places near her complex or her children run errands for her. Patient never goes without.   Patient enjoys sewing, crafts, shopping, and  walking.    Patient has 3 children and she is close with all of them.        Social Determinants of Health   Financial Resource Strain: Not on file  Food Insecurity: Not on file  Transportation Needs: Not on file  Physical Activity: Not on file  Stress: Not on file  Social Connections: Not on file  Intimate Partner Violence: Not on file    Family History:    Family History  Problem Relation Age of Onset  . Stroke Mother   . Other Father        broken heart after spouse spouse  . Diabetes Sister   . Hypertension Sister   . Diabetes Sister   . Multiple sclerosis Sister   . Alcohol abuse Other   . Diabetes Other   . Breast cancer Neg Hx      ROS:  Please see the history of present illness.   All other ROS reviewed and negative.     Physical Exam/Data:   Vitals:   02/05/20 0000 02/05/20 0355 02/05/20 0906 02/05/20 1101  BP: (!) 152/111 (!) 151/118 (!) 161/107 (!) 158/122  Pulse: (!) 123 (!) 124 (!) 125 (!) 126  Resp: (!) 21 18 (!) 22 (!) 22  Temp: 98 F (36.7 C) 97.9 F (36.6 C) 98.2 F (36.8 C) 98.1 F (36.7 C)  TempSrc: Oral Oral Oral Oral  SpO2: 97% 98% 99% 100%  Weight:      Height:        Intake/Output Summary (Last 24 hours) at 02/05/2020 1538 Last data filed at 02/04/2020 2115 Gross per 24 hour  Intake 290.13 ml  Output -  Net 290.13 ml   Last 3 Weights 02/03/2020 11/13/2019 11/09/2019  Weight (lbs) 165 lb 184 lb 6.4 oz 183 lb 9.6 oz  Weight (kg) 74.844 kg 83.643 kg 83.28 kg     Body mass index is 29.23 kg/m.  General:  Well nourished, well developed, in no acute distress HEENT: normal Lymph: no adenopathy Neck: no JVD Endocrine:  No thryomegaly Vascular: No carotid bruits; FA pulses 2+ bilaterally without bruits  Cardiac:  normal S1, S2; Tachy; no murmur  Lungs:  clear to auscultation bilaterally, no wheezing, rhonchi or rales  Abd: soft, nontender, no  hepatomegaly  Ext: no edema Musculoskeletal:  No deformities, BUE and BLE strength normal  and equal Skin: warm and dry  Neuro:  CNs 2-12 intact, no focal abnormalities noted Psych:  Normal affect   EKG:  The EKG was personally reviewed and demonstrates: Atrial flutter 2:1 143bpm  Telemetry:  Telemetry was personally reviewed and demonstrates:  Episodes of afib, but most recently atrial flutter with rates in the 130s  Relevant CV Studies:  Echo: 02/04/20  IMPRESSIONS    1. Since the last study on 07/27/2019 LVEF has decreased, now severely  impaired at 25-30% and diffuse hypokinesis. Severe concentric LVH. RVEF is  at least moderately decreased. Evaluation for infiltrative  cardiomyopathies such as cardiac amyloidosis  should be considered.  2. Left ventricular ejection fraction, by estimation, is 25 to 30%. The  left ventricle has severely decreased function. The left ventricle has no  regional wall motion abnormalities. There is severe concentric left  ventricular hypertrophy. Left  ventricular diastolic function could not be evaluated.  3. Right ventricular systolic function is moderately reduced. The right  ventricular size is moderately enlarged. There is moderately elevated  pulmonary artery systolic pressure. The estimated right ventricular  systolic pressure is 10.6 mmHg.  4. Left atrial size was mildly dilated.  5. The mitral valve is normal in structure. Mild mitral valve  regurgitation. No evidence of mitral stenosis.  6. Tricuspid valve regurgitation is moderate to severe.  7. The aortic valve is normal in structure. Aortic valve regurgitation is  not visualized. Mild to moderate aortic valve sclerosis/calcification is  present, without any evidence of aortic stenosis.  8. The inferior vena cava is normal in size with greater than 50%  respiratory variability, suggesting right atrial pressure of 3 mmHg.   Laboratory Data:  High Sensitivity Troponin:   Recent Labs  Lab 02/04/20 0657  TROPONINIHS 20*  20*     Chemistry Recent Labs  Lab  02/03/20 1445 02/04/20 0657 02/05/20 0251  NA 138 134* 133*  K 4.2 4.3 4.2  CL 103 103 102  CO2 27 21* 23  GLUCOSE 133* 218* 165*  BUN $Re'15 12 14  'jqJ$ CREATININE 0.92 0.69 0.75  CALCIUM 8.4* 8.1* 8.3*  GFRNONAA >60 >60 >60  ANIONGAP $RemoveB'8 10 8    'qKjUxaOg$ Recent Labs  Lab 02/03/20 1445 02/04/20 0657 02/05/20 0251  PROT 7.2 6.4* 6.0*  ALBUMIN 2.8* 2.4* 2.2*  AST 50* 36 32  ALT 73* 61* 51*  ALKPHOS 91 77 75  BILITOT 0.4 0.7 0.8   Hematology Recent Labs  Lab 02/03/20 1445 02/04/20 0657 02/05/20 0251  WBC 6.8 5.7 5.0  RBC 5.46* 5.43* 4.98  HGB 13.9 13.5 12.9  HCT 44.4 44.8 40.1  MCV 81.3 82.5 80.5  MCH 25.5* 24.9* 25.9*  MCHC 31.3 30.1 32.2  RDW 14.5 14.6 14.5  PLT 233 200 188   BNPNo results for input(s): BNP, PROBNP in the last 168 hours.  DDimer  Recent Labs  Lab 02/04/20 0657  DDIMER <0.27  0.31     Radiology/Studies:  Portable chest 1 View  Result Date: 02/04/2020 CLINICAL DATA:  Shortness of breath EXAM: PORTABLE CHEST 1 VIEW COMPARISON:  February 03, 2020 FINDINGS: There is persistent cardiomegaly. There is no pneumothorax or large pleural effusion. There are findings are suspicious for air bronchograms in the retrocardiac region. There is no acute osseous abnormality. IMPRESSION: Cardiomegaly with findings suspicious for retrocardiac airspace disease. Electronically Signed   By: Constance Holster M.D.   On: 02/04/2020 02:14  DG Chest Port 1 View  Result Date: 02/03/2020 CLINICAL DATA:  Shortness of breath. EXAM: PORTABLE CHEST 1 VIEW COMPARISON:  Chest radiograph 07/26/2019. FINDINGS: Stable enlarged cardiac and mediastinal contours. No consolidative pulmonary opacities. No pleural effusion or pneumothorax. Osseous structures unremarkable. IMPRESSION: No active disease. Electronically Signed   By: Lovey Newcomer M.D.   On: 02/03/2020 12:14   ECHOCARDIOGRAM COMPLETE  Result Date: 02/04/2020    ECHOCARDIOGRAM REPORT   Patient Name:   [name] [name] Date of Exam:  02/04/2020 Medical Rec #:  [id number]         Height:       63.0 in Accession #:    [id number]        Weight:       165.0 lb Date of Birth:  Sep 15, 1950        BSA:          1.782 m Patient Age:    43 years          BP:           105/80 mmHg Patient Gender: F                 HR:           120 bpm. Exam Location:  Inpatient Procedure: 2D Echo, Cardiac Doppler, Color Doppler and Intracardiac            Opacification Agent Indications:    I48.91* Unspeicified atrial fibrillation  History:        Patient has prior history of Echocardiogram examinations, most                 recent 07/31/2019. CHF, Abnormal ECG, Arrythmias:Atrial                 Fibrillation; Risk Factors:Diabetes, Hypertension and                 Dyslipidemia.  Sonographer:    Roseanna Rainbow RDCS Referring Phys: [id number] ERIC CHEN  Sonographer Comments: Technically difficult study due to poor echo windows and patient is morbidly obese. Image acquisition challenging due to patient body habitus. Delayed for consult. IMPRESSIONS  1. Since the last study on 07/27/2019 LVEF has decreased, now severely impaired at 25-30% and diffuse hypokinesis. Severe concentric LVH. RVEF is at least moderately decreased. Evaluation for infiltrative cardiomyopathies such as cardiac amyloidosis should be considered.  2. Left ventricular ejection fraction, by estimation, is 25 to 30%. The left ventricle has severely decreased function. The left ventricle has no regional wall motion abnormalities. There is severe concentric left ventricular hypertrophy. Left ventricular diastolic function could not be evaluated.  3. Right ventricular systolic function is moderately reduced. The right ventricular size is moderately enlarged. There is moderately elevated pulmonary artery systolic pressure. The estimated right ventricular systolic pressure is 32.6 mmHg.  4. Left atrial size was mildly dilated.  5. The mitral valve is normal in structure. Mild mitral valve regurgitation. No evidence of mitral  stenosis.  6. Tricuspid valve regurgitation is moderate to severe.  7. The aortic valve is normal in structure. Aortic valve regurgitation is not visualized. Mild to moderate aortic valve sclerosis/calcification is present, without any evidence of aortic stenosis.  8. The inferior vena cava is normal in size with greater than 50% respiratory variability, suggesting right atrial pressure of 3 mmHg. FINDINGS  Left Ventricle: Left ventricular ejection fraction, by estimation, is 25 to 30%. The left ventricle has severely decreased function. The left ventricle has no regional  wall motion abnormalities. Definity contrast agent was given IV to delineate the left  ventricular endocardial borders. The left ventricular internal cavity size was normal in size. There is severe concentric left ventricular hypertrophy. Left ventricular diastolic function could not be evaluated due to atrial fibrillation. Left ventricular diastolic function could not be evaluated. Right Ventricle: The right ventricular size is moderately enlarged. No increase in right ventricular wall thickness. Right ventricular systolic function is moderately reduced. There is moderately elevated pulmonary artery systolic pressure. The tricuspid  regurgitant velocity is 2.97 m/s, and with an assumed right atrial pressure of 15 mmHg, the estimated right ventricular systolic pressure is 53.6 mmHg. Left Atrium: Left atrial size was mildly dilated. Right Atrium: Right atrial size was normal in size. Pericardium: Trivial pericardial effusion is present. Mitral Valve: The mitral valve is normal in structure. Mild mitral valve regurgitation. No evidence of mitral valve stenosis. Tricuspid Valve: The tricuspid valve is normal in structure. Tricuspid valve regurgitation is moderate to severe. No evidence of tricuspid stenosis. Aortic Valve: The aortic valve is normal in structure. Aortic valve regurgitation is not visualized. Mild to moderate aortic valve  sclerosis/calcification is present, without any evidence of aortic stenosis. Pulmonic Valve: The pulmonic valve was normal in structure. Pulmonic valve regurgitation is mild. No evidence of pulmonic stenosis. Aorta: The aortic root is normal in size and structure. Venous: The inferior vena cava is normal in size with greater than 50% respiratory variability, suggesting right atrial pressure of 3 mmHg. IAS/Shunts: No atrial level shunt detected by color flow Doppler.  LEFT VENTRICLE PLAX 2D LVIDd:         3.90 cm LVIDs:         3.40 cm LV PW:         1.60 cm LV IVS:        1.85 cm LVOT diam:     2.00 cm LV SV:         22 LV SV Index:   12 LVOT Area:     3.14 cm  LV Volumes (MOD) LV vol d, MOD A2C: 49.3 ml LV vol d, MOD A4C: 41.9 ml LV vol s, MOD A2C: 40.3 ml LV vol s, MOD A4C: 33.3 ml LV SV MOD A2C:     9.0 ml LV SV MOD A4C:     41.9 ml LV SV MOD BP:      8.8 ml IVC IVC diam: 2.00 cm LEFT ATRIUM             Index       RIGHT ATRIUM           Index LA diam:        4.30 cm 2.41 cm/m  RA Area:     15.50 cm LA Vol (A2C):   46.1 ml 25.87 ml/m RA Volume:   36.00 ml  20.20 ml/m LA Vol (A4C):   64.6 ml 36.25 ml/m LA Biplane Vol: 58.2 ml 32.66 ml/m  AORTIC VALVE             PULMONIC VALVE LVOT Vmax:   56.40 cm/s  PR End Diast Vel: 2.12 msec LVOT Vmean:  32.900 cm/s LVOT VTI:    0.069 m  AORTA Ao Root diam: 3.40 cm Ao Asc diam:  3.10 cm MITRAL VALVE                TRICUSPID VALVE MV Area (PHT): 5.73 cm     TR Peak grad:   35.3 mmHg MV Decel Time:  133 msec     TR Vmax:        297.00 cm/s MV E velocity: 116.00 cm/s                             SHUNTS                             Systemic VTI:  0.07 m                             Systemic Diam: 2.00 cm Ena Dawley MD Electronically signed by Ena Dawley MD Signature Date/Time: 02/04/2020/11:40:45 AM    Final      Assessment and Plan:   [name] [name] is a 70 y.o. female with a hx of PAF (TEE/DCCV 7/21), sick sinus syndrome, Chronic systolic and diastolic CHF,  HTN, HLD, and DM who is being seen today for the evaluation of CHF and Afib at the request of Dr. Gwendlyn Deutscher.  1. Acute on Chronic systolic CHF: does have known hx of HF with EF being 40-45% back last year in the setting of Afib RVR. Felt to be tachy-mediated. EF is now down to 25-30% in the setting of COVID and aflutter. She was unaware of her elevated rates prior to admission, therefore unclear how long she has been out of rhythm. Reports compliance with home medications prior to admission including Eliquis. Initially placed on Dilt gtt on admission but stopped with new low EF and currently on Toprol XL $RemoveB'150mg'PNWqRrlI$  daily. Was put on Entresto as an outpatient but stopped as she was concerned regarding side effects and suppose to switch to valsartan. Says she was not taking this though. Had been on Enalapril PTA.  -- continue on Toprol XL, will attempt to revisit the idea of Entresto. Possibly spiro prior to discharge  2. Aflutter/afib RVR: EKG on admission actually looks most like aflutter, has also had some episodes of afib on telemetry. Currently in aflutter on telemetry. Suspect with her low EF she will not tolerate rapid rates. Would benefit from cardioversion. She reports being compliant with medications prior to admission but there has been concern with compliance in the past.  -- review with MD and plan for TEE/DCCV tomorrow pending patient is agreeable.  -- continue on Toprol XL and Eliquis  3. COVID +: has been stable on room air. Not treated with remdesevir as symptoms were >7 ago.   4. Oral Thrush: treated with nystatin   5. DM: Hgb A1c 10.4 -- on SSI while inpatient, home insulin prior to admission -- may benefit from [place] prior to discharge  6. HTN: blood pressures remain elevated. As above, will revisit Entresto, if she is not agreeable would resume enalapril/ +/- spiro.    Risk Assessment/Risk Scores:   New York Heart Association (NYHA) Functional Class NYHA Class I  CHA2DS2-VASc  Score = 4  This indicates a 4.8% annual risk of stroke. The patient's score is based upon: CHF History: Yes HTN History: Yes Diabetes History: Yes Stroke History: No Vascular Disease History: No   For questions or updates, please contact [place] Please consult [email or web address] for contact info under    Signed, Reino Bellis, NP  02/05/2020 3:38 PM

## 2020-02-05 NOTE — Progress Notes (Signed)
Family Medicine Teaching Service Daily Progress Note Intern Pager: [phone]  Patient name: ADANNA ZUCKERMAN Medical record number: [id number] Date of birth: 1950/08/22 Age: 70 y.o. Gender: female  Primary Care Provider: Dana Allan, MD Consultants: None Code Status: Full  Pt Overview and Major Events to Date:  1/24- Admitted  Assessment and Plan: Ms. Dix is a 70yo female with a history of Afib, T2DM, HFrEF who presents with 1 week of weakness, found to have COVID-19 and a-fib with RVR.  A-Fib w/ RVR- resolved  Sinus tachycardia Patient remains in NSR with HR in the 120s at rest. S/p dilt gtt on admission. She received a small fluid bolus yesterday and an additional 5mg  IV metoprolol overnight without significant improvement in her HR. Likely secondary to COVID - Increase metoprolol to 150mg   - Will reach out to cardiology for additional recommendations - Telemetry monitoring - Continue Eliquis 5mg  BID  HFrEF Echo on 02/04/20 showed EF 25-30%, decreased compared to 40-45% in July. Consider COVID cardiomyopathy as a contributing factor. Home meds: metoprolol succinate 100mg  daily -Metoprolol 150mg  daily as above -Will reach out to cardiology for additional recommendations  COVID+ Remains stable on room air. Did not initiate remdesevir as patient's symptoms began >7 days ago. - Tylenol 650mg  q6h prn for fever, pain  Poorly-controlled DM2 Fasting glucose 155 this morning. BG range 155-199 over past 24h. Received 9u SAI and 18u Lantus yesterday. Home meds: semaglutide 0.25 mg weekly. A1c 10.4 on admission. - Lantus 18u daily - SSI - PCP follow-up for inadequate control   Asymptomatic Bacteruria UA on admission showed many bacteria and >50 WBCs. Patient completely asymptomatic. S/p CTX x1 on admission. Urine culture pending.  - f/u urine culture results  Oral Thrush Mild thrush of tongue. Likely 2/2 poorly controlled diabetes. HIV negative in July. Not sexually active. No  IVDU.  - Continue Nystatin rinse   FEN/GI: Heart healthy PPx: On chronic Eliquis for AFib   Status is: Observation The patient remains OBS appropriate and will d/c before 2 midnights.  Dispo: The patient is from: Home              Anticipated d/c is to: Home              Anticipated d/c date is: 1 day              Patient currently is not medically stable to d/c.   Difficult to place patient No    Subjective:  Patient denies complaints this morning other than some ongoing weakness. She is able to get out of bed and use the bedside commode without difficulty. She denies shortness of breath, palpitations, cough, or GI symptoms.  Objective: Temp:  [97.6 F (36.4 C)-98.3 F (36.8 C)] 97.9 F (36.6 C) (01/25 0355) Pulse Rate:  [90-124] 124 (01/25 0355) Resp:  [15-26] 18 (01/25 0355) BP: (105-159)/(80-118) 151/118 (01/25 0355) SpO2:  [97 %-100 %] 98 % (01/25 0355) Physical Exam: General: Well-appearing, comfortably sitting up in bed Cardiovascular: tachycardic, normal S1/S2 Respiratory: Lungs CTAB, normal WOB on RA Abdomen: Soft, nondistended, nontender Extremities: No LE edema, all extremities warm and well-perfused  Laboratory: Recent Labs  Lab 02/03/20 1445 02/04/20 0657 02/05/20 0251  WBC 6.8 5.7 5.0  HGB 13.9 13.5 12.9  HCT 44.4 44.8 40.1  PLT 233 200 188   Recent Labs  Lab 02/03/20 1445 02/04/20 0657 02/05/20 0251  NA 138 134* 133*  K 4.2 4.3 4.2  CL 103 103 102  CO2 27 21*  23  BUN 15 12 14   CREATININE 0.92 0.69 0.75  CALCIUM 8.4* 8.1* 8.3*  PROT 7.2 6.4* 6.0*  BILITOT 0.4 0.7 0.8  ALKPHOS 91 77 75  ALT 73* 61* 51*  AST 50* 36 32  GLUCOSE 133* 218* 165*    Imaging/Diagnostic Tests: No new imaging/diagnostic tests.   , MD 02/05/2020, 6:18 AM

## 2020-02-05 NOTE — Progress Notes (Signed)
Heart rate remains elevated in 120s, reordered IV metoprolol 5x1.  Patient otherwise hemodynamically stable, asymptomatic.  Peggyann Shoals, DO [facility], PGY-3 02/05/2020 5:07 AM

## 2020-02-06 ENCOUNTER — Encounter (HOSPITAL_COMMUNITY)
Admission: EM | Disposition: A | Discharge status: Home Health | Payer: Self-pay | Source: Home / Self Care | Attending: Family Medicine

## 2020-02-06 DIAGNOSIS — Z7901 Long term (current) use of anticoagulants: Secondary | ICD-10-CM | POA: Diagnosis not present

## 2020-02-06 DIAGNOSIS — I110 Hypertensive heart disease with heart failure: Secondary | ICD-10-CM | POA: Diagnosis present

## 2020-02-06 DIAGNOSIS — R0602 Shortness of breath: Secondary | ICD-10-CM | POA: Diagnosis not present

## 2020-02-06 DIAGNOSIS — I480 Paroxysmal atrial fibrillation: Secondary | ICD-10-CM | POA: Diagnosis present

## 2020-02-06 DIAGNOSIS — Z823 Family history of stroke: Secondary | ICD-10-CM | POA: Diagnosis not present

## 2020-02-06 DIAGNOSIS — K649 Unspecified hemorrhoids: Secondary | ICD-10-CM | POA: Diagnosis present

## 2020-02-06 DIAGNOSIS — N3000 Acute cystitis without hematuria: Secondary | ICD-10-CM | POA: Diagnosis present

## 2020-02-06 DIAGNOSIS — I484 Atypical atrial flutter: Secondary | ICD-10-CM | POA: Diagnosis not present

## 2020-02-06 DIAGNOSIS — I5043 Acute on chronic combined systolic (congestive) and diastolic (congestive) heart failure: Secondary | ICD-10-CM | POA: Diagnosis present

## 2020-02-06 DIAGNOSIS — Z91010 Allergy to peanuts: Secondary | ICD-10-CM | POA: Diagnosis not present

## 2020-02-06 DIAGNOSIS — R000 Tachycardia, unspecified: Secondary | ICD-10-CM | POA: Diagnosis present

## 2020-02-06 DIAGNOSIS — I5042 Chronic combined systolic (congestive) and diastolic (congestive) heart failure: Secondary | ICD-10-CM | POA: Diagnosis not present

## 2020-02-06 DIAGNOSIS — I4891 Unspecified atrial fibrillation: Secondary | ICD-10-CM | POA: Diagnosis present

## 2020-02-06 DIAGNOSIS — I420 Dilated cardiomyopathy: Secondary | ICD-10-CM | POA: Diagnosis present

## 2020-02-06 DIAGNOSIS — I10 Essential (primary) hypertension: Secondary | ICD-10-CM

## 2020-02-06 DIAGNOSIS — I429 Cardiomyopathy, unspecified: Secondary | ICD-10-CM

## 2020-02-06 DIAGNOSIS — E1165 Type 2 diabetes mellitus with hyperglycemia: Secondary | ICD-10-CM | POA: Diagnosis present

## 2020-02-06 DIAGNOSIS — Z820 Family history of epilepsy and other diseases of the nervous system: Secondary | ICD-10-CM | POA: Diagnosis not present

## 2020-02-06 DIAGNOSIS — Z794 Long term (current) use of insulin: Secondary | ICD-10-CM | POA: Diagnosis not present

## 2020-02-06 DIAGNOSIS — U071 COVID-19: Secondary | ICD-10-CM | POA: Diagnosis not present

## 2020-02-06 DIAGNOSIS — Z886 Allergy status to analgesic agent status: Secondary | ICD-10-CM | POA: Diagnosis not present

## 2020-02-06 DIAGNOSIS — Z79899 Other long term (current) drug therapy: Secondary | ICD-10-CM | POA: Diagnosis not present

## 2020-02-06 DIAGNOSIS — Z6829 Body mass index (BMI) 29.0-29.9, adult: Secondary | ICD-10-CM | POA: Diagnosis not present

## 2020-02-06 DIAGNOSIS — B370 Candidal stomatitis: Secondary | ICD-10-CM | POA: Diagnosis not present

## 2020-02-06 DIAGNOSIS — Z8249 Family history of ischemic heart disease and other diseases of the circulatory system: Secondary | ICD-10-CM | POA: Diagnosis not present

## 2020-02-06 DIAGNOSIS — Z91018 Allergy to other foods: Secondary | ICD-10-CM | POA: Diagnosis not present

## 2020-02-06 DIAGNOSIS — Z833 Family history of diabetes mellitus: Secondary | ICD-10-CM | POA: Diagnosis not present

## 2020-02-06 DIAGNOSIS — E785 Hyperlipidemia, unspecified: Secondary | ICD-10-CM | POA: Diagnosis present

## 2020-02-06 LAB — COMPREHENSIVE METABOLIC PANEL
ALT: 46 U/L — ABNORMAL HIGH (ref 0–44)
AST: 30 U/L (ref 15–41)
Albumin: 2.3 g/dL — ABNORMAL LOW (ref 3.5–5.0)
Alkaline Phosphatase: 76 U/L (ref 38–126)
Anion gap: 8 (ref 5–15)
BUN: 15 mg/dL (ref 8–23)
CO2: 25 mmol/L (ref 22–32)
Calcium: 8.7 mg/dL — ABNORMAL LOW (ref 8.9–10.3)
Chloride: 102 mmol/L (ref 98–111)
Creatinine, Ser: 0.82 mg/dL (ref 0.44–1.00)
GFR, Estimated: >60 mL/min (ref >60)
Glucose, Bld: 86 mg/dL (ref 70–99)
Potassium: 4 mmol/L (ref 3.5–5.1)
Sodium: 135 mmol/L (ref 135–145)
Total Bilirubin: 0.5 mg/dL (ref 0.3–1.2)
Total Protein: 6.1 g/dL — ABNORMAL LOW (ref 6.5–8.1)

## 2020-02-06 LAB — CBC WITH DIFFERENTIAL/PLATELET
Abs Immature Granulocytes: 0.02 10*3/uL (ref 0.00–0.07)
Basophils Absolute: 0 10*3/uL (ref 0.0–0.1)
Basophils Relative: 0 %
Eosinophils Absolute: 0.2 10*3/uL (ref 0.0–0.5)
Eosinophils Relative: 3 %
HCT: 41.9 % (ref 36.0–46.0)
Hemoglobin: 12.6 g/dL (ref 12.0–15.0)
Immature Granulocytes: 0 %
Lymphocytes Relative: 47 %
Lymphs Abs: 2.5 10*3/uL (ref 0.7–4.0)
MCH: 24.7 pg — ABNORMAL LOW (ref 26.0–34.0)
MCHC: 30.1 g/dL (ref 30.0–36.0)
MCV: 82 fL (ref 80.0–100.0)
Monocytes Absolute: 0.4 10*3/uL (ref 0.1–1.0)
Monocytes Relative: 8 %
Neutro Abs: 2.3 10*3/uL (ref 1.7–7.7)
Neutrophils Relative %: 42 %
Platelets: 242 10*3/uL (ref 150–400)
RBC: 5.11 MIL/uL (ref 3.87–5.11)
RDW: 14.3 % (ref 11.5–15.5)
WBC: 5.4 10*3/uL (ref 4.0–10.5)
nRBC: 0 % (ref 0.0–0.2)

## 2020-02-06 LAB — GLUCOSE, CAPILLARY
Glucose-Capillary: 79 mg/dL (ref 70–99)
Glucose-Capillary: 172 mg/dL — ABNORMAL HIGH (ref 70–99)
Glucose-Capillary: 215 mg/dL — ABNORMAL HIGH (ref 70–99)
Glucose-Capillary: 150 mg/dL — ABNORMAL HIGH (ref 70–99)

## 2020-02-06 SURGERY — ECHOCARDIOGRAM, TRANSESOPHAGEAL
Anesthesia: Monitor Anesthesia Care

## 2020-02-06 MED ORDER — AMIODARONE LOAD VIA INFUSION
150.0000 mg | Freq: Once | INTRAVENOUS | Status: AC
  Administered 2020-02-06: 150 mg via INTRAVENOUS
  Filled 2020-02-06: qty 83.34

## 2020-02-06 MED ORDER — AMIODARONE HCL IN DEXTROSE 360-4.14 MG/200ML-% IV SOLN
60.0000 mg/h | INTRAVENOUS | Status: AC
  Administered 2020-02-06: 60 mg/h via INTRAVENOUS
  Filled 2020-02-06: qty 200

## 2020-02-06 MED ORDER — AMIODARONE HCL IN DEXTROSE 360-4.14 MG/200ML-% IV SOLN
30.0000 mg/h | INTRAVENOUS | Status: DC
  Administered 2020-02-06 – 2020-02-07 (×2): 30 mg/h via INTRAVENOUS
  Filled 2020-02-06 (×2): qty 200

## 2020-02-06 MED ORDER — METOPROLOL TARTRATE 5 MG/5ML IV SOLN
5.0000 mg | Freq: Once | INTRAVENOUS | Status: AC
  Administered 2020-02-06: 5 mg via INTRAVENOUS
  Filled 2020-02-06: qty 5

## 2020-02-06 NOTE — Progress Notes (Signed)
Interim progress note  Visited patient in her room room this evening to check in on her.  Patient has continued to have A. fib/a flutter with associated heart rate in the 120-140 range.  Team signed out to me that patient was unwilling to change any of her medication regimen regarding her worsening heart failure and continued arrhythmia with rapid ventricular rate.  She said she is not interested in any medications with other side effects.  I also revisited the patient's CODE STATUS with her, she would like to remain full code.  Peggyann Shoals, DO [facility], PGY-3 02/06/2020 12:33 AM

## 2020-02-06 NOTE — Progress Notes (Addendum)
Family Medicine Teaching Service Daily Progress Note Intern Pager: [phone]  Patient name: [name] [name] Medical record number: [id number] Date of birth: [birth year] Age: 70 y.o. Gender: female  Primary Care Provider: Dana Allan, MD Consultants: Cards Code Status: Full  Pt Overview and Major Events to Date:  1/24- Admitted 1/25- Cardiology consulted, patient refused further intervention  Assessment and Plan: [name] [name] is a 70yo female with a history of Afib, T2DM, HFrEF who presents with 1 week of weakness, found to have COVID-19 and a-fib with RVR. Later found to have atypical A Flutter.   Atrial Flutter with RVR  Remains tachycardic to the 120s. Saw cardiology yesterday who recommended cardioversion vs. adding amiodarone, but she refused any change at that time. After further conversation with pt and her daughter, she is agreeable to starting amiodarone at this time. She would still like to leave today, but voices understanding that we would like to get her rate controlled prior to discharge.  - Metoprolol succinate 150mg  daily - Started IV amiodarone - Reconsult to cards given her decision to start amiodarone after all  - Telemetry monitoring - Continue Eliquis 5mg  BID  HFrEF  HTN Patient currently refusing any medication changes at this time. BPs have been persistently elevated to the 150s. Home med list included enalapril and amlodipine but patient reports she was not taking this. - Metoprolol 150mg  daily as above - Pt refuses further intervention  COVID+ Remains stable on room air. Did not initiate remdesevir as patient's symptoms began >7 days ago. - Tylenol 650mg  q6h prn for fever, pain  Poorly-controlled DM2 Fasting glucose 79 this morning. BG range 79-175 over past 24h. Received 3n SAI  and 18u Lantus yesterday. Home meds: semaglutide 0.25 mg weekly. A1c 10.4 on admission. - Lantus 18u daily - SSI - PCP follow-up for inadequate control   Oral  Thrush Nearly resolved. Patient pleased with improvement. - Day 3/7 nystatin therapy  FEN/GI: Heart healthy PPx: On chronic Eliquis for AFib   Status is: Observation  The patient remains OBS appropriate and will d/c before 2 midnights.  Dispo: The patient is from: Home              Anticipated d/c is to: Home              Anticipated d/c date is: 1 day              Patient currently is not medically stable to d/c.   Difficult to place patient No  Subjective:  [name] [name] feels well this morning and is asking to go home. We discussed the risks of going home with inadequately controlled Atrial Flutter, especially given her baseline heart failure. She is amenable to starting amiodarone and understands that we would like to get her rate controlled prior to discharge and that this may hold her here for one more day.   Objective: Temp:  [97.5 F (36.4 C)-98.3 F (36.8 C)] 97.5 F (36.4 C) (01/26 0000) Pulse Rate:  [115-126] 122 (01/26 0400) Resp:  [18-24] 20 (01/26 0400) BP: (147-161)/(107-122) 153/119 (01/26 0400) SpO2:  [96 %-100 %] 97 % (01/26 0400) Physical Exam: General: Comfortable appearing, sitting up in bed Cardiovascular: regular, tachycardic, no m/r/g Respiratory: Lungs CTAB  Abdomen: Soft, non-tender, non-distended Extremities: No LE edema, all extremities warm and well-perfused  Laboratory: Recent Labs  Lab 02/04/20 0657 02/05/20 0251 02/06/20 0110  WBC 5.7 5.0 5.4  HGB 13.5 12.9 12.6  HCT 44.8 40.1 41.9  PLT 200 188  242   Recent Labs  Lab 02/04/20 0657 02/05/20 0251 02/06/20 0110  NA 134* 133* 135  K 4.3 4.2 4.0  CL 103 102 102  CO2 21* 23 25  BUN 12 14 15   CREATININE 0.69 0.75 0.82  CALCIUM 8.1* 8.3* 8.7*  PROT 6.4* 6.0* 6.1*  BILITOT 0.7 0.8 0.5  ALKPHOS 77 75 76  ALT 61* 51* 46*  AST 36 32 30  GLUCOSE 218* 165* 86    Imaging/Diagnostic Tests: No new imaging/tests  , Medical Student 02/06/2020, 5:37 AM   Resident  attestation: I agree with the documentation of Student Dr. 02/08/2020 above. I have made adjustments to his note as appropriate. I have seen the patient and performed physical exam on the patient consistent with his documented physical exam above.  I had prolonged discussion with the patient about the potential adverse effects of continuing with a rapid heart rate as she has at this time particular with her history of heart failure.  I also discussed cardiology recommendation for amiodarone versus cardioversion.  I spoke in detail about treatments including the amiodarone and answer the patient's questions about adverse effects versus continuing without any medication.  I also spoke with the patient's daughter on the phone while in the presence of the patient per the patient's request and voiced the options and recommendations.  At the end of the discussion the patient did decide she would be interested in trying amiodarone but did not want to make further adjustments to her medication regimen.  Due to this we did place an order to initiate amiodarone and did reach out to cardiology to inform them as the patient had changed her mind and is now interested in using this medication.  The patient continues to voice the fact that she wishes to discharge home today but she did express understanding to me that it will take some time to get her rate under controlled and for Marisue Humble to feel comfortable discharging her we would like for that to occur.  Patient does understand that she has capacity and autonomy and is able to discharge as she wishes, however would be against medical advice and the patient wishes to remain with [place] at this time.  [place], DO

## 2020-02-06 NOTE — Progress Notes (Signed)
Progress Note  Patient Name: [name] [name] Date of Encounter: 02/06/2020  [facility] Cardiologist: Kristeen Miss, MD  Subjective   Asked by primary team to see patient as she had changed her mind about treatment. On my arrival, she denied changing her preferences. She is adamant that she does not want cardioversion. She was asking about cardizem, but we discussed that given her low EF this is contraindicated. We then discussed amiodarone at length. She is very concerned, we reviewed risks at length. Her main concern is that she cannot swallow large pills. She is willing to try it but still wants to go home. Unwilling to discuss change to BP meds.  Inpatient Medications    Scheduled Meds: . amiodarone  150 mg Intravenous Once  . apixaban  5 mg Oral BID  . insulin aspart  0-15 Units Subcutaneous TID WC  . insulin aspart  0-5 Units Subcutaneous QHS  . insulin glargine  18 Units Subcutaneous Daily  . metoprolol succinate  150 mg Oral Daily  . multivitamin with minerals  1 tablet Oral Daily  . nystatin  5 mL Oral QID   Continuous Infusions: . sodium chloride Stopped (02/03/20 1959)  . amiodarone     Followed by  . amiodarone     PRN Meds: sodium chloride, acetaminophen, ondansetron **OR** ondansetron (ZOFRAN) IV, polyethylene glycol   Vital Signs    Vitals:   02/05/20 2205 02/06/20 0000 02/06/20 0400 02/06/20 0855  BP: (!) 155/112 (!) 147/112 (!) 153/119 (!) 160/118  Pulse: (!) 115 (!) 122 (!) 122 (!) 125  Resp: 20 18 20 20   Temp: 98.3 F (36.8 C) (!) 97.5 F (36.4 C)  (!) 97.5 F (36.4 C)  TempSrc: Oral Oral  Oral  SpO2: 96% 98% 97% 100%  Weight:      Height:        Intake/Output Summary (Last 24 hours) at 02/06/2020 1130 Last data filed at 02/06/2020 0900 Gross per 24 hour  Intake 240 ml  Output 500 ml  Net -260 ml   Last 3 Weights 02/03/2020 11/13/2019 11/09/2019  Weight (lbs) 165 lb 184 lb 6.4 oz 183 lb 9.6 oz  Weight (kg) 74.844 kg 83.643 kg 83.28 kg       Telemetry    2:1 atrial flutter - Personally Reviewed  ECG    No new - Personally Reviewed  Physical Exam   GEN: No acute distress.   Neck: No JVD Cardiac: tachycardic but RRR, no murmurs, rubs, or gallops.  Respiratory: Clear to auscultation bilaterally. GI: Soft, nontender, non-distended  MS: No edema; No deformity. Neuro:  Nonfocal  Psych: Normal affect   Labs    High Sensitivity Troponin:   Recent Labs  Lab 02/04/20 0657  TROPONINIHS 20*  20*      Chemistry Recent Labs  Lab 02/04/20 0657 02/05/20 0251 02/06/20 0110  NA 134* 133* 135  K 4.3 4.2 4.0  CL 103 102 102  CO2 21* 23 25  GLUCOSE 218* 165* 86  BUN 12 14 15   CREATININE 0.69 0.75 0.82  CALCIUM 8.1* 8.3* 8.7*  PROT 6.4* 6.0* 6.1*  ALBUMIN 2.4* 2.2* 2.3*  AST 36 32 30  ALT 61* 51* 46*  ALKPHOS 77 75 76  BILITOT 0.7 0.8 0.5  GFRNONAA >60 >60 >60  ANIONGAP 10 8 8      Hematology Recent Labs  Lab 02/04/20 0657 02/05/20 0251 02/06/20 0110  WBC 5.7 5.0 5.4  RBC 5.43* 4.98 5.11  HGB 13.5 12.9 12.6  HCT 44.8 40.1 41.9  MCV 82.5 80.5 82.0  MCH 24.9* 25.9* 24.7*  MCHC 30.1 32.2 30.1  RDW 14.6 14.5 14.3  PLT 200 188 242    BNPNo results for input(s): BNP, PROBNP in the last 168 hours.   DDimer  Recent Labs  Lab 02/04/20 0657  DDIMER <0.27  0.31     Radiology    No results found.  Cardiac Studies   Echo 02/04/20 1. Since the last study on 07/27/2019 LVEF has decreased, now severely  impaired at 25-30% and diffuse hypokinesis. Severe concentric LVH. RVEF is  at least moderately decreased. Evaluation for infiltrative  cardiomyopathies such as cardiac amyloidosis  should be considered.  2. Left ventricular ejection fraction, by estimation, is 25 to 30%. The  left ventricle has severely decreased function. The left ventricle has no  regional wall motion abnormalities. There is severe concentric left  ventricular hypertrophy. Left  ventricular diastolic function could not be  evaluated.  3. Right ventricular systolic function is moderately reduced. The right  ventricular size is moderately enlarged. There is moderately elevated  pulmonary artery systolic pressure. The estimated right ventricular  systolic pressure is 50.3 mmHg.  4. Left atrial size was mildly dilated.  5. The mitral valve is normal in structure. Mild mitral valve  regurgitation. No evidence of mitral stenosis.  6. Tricuspid valve regurgitation is moderate to severe.  7. The aortic valve is normal in structure. Aortic valve regurgitation is  not visualized. Mild to moderate aortic valve sclerosis/calcification is  present, without any evidence of aortic stenosis.  8. The inferior vena cava is normal in size with greater than 50%  respiratory variability, suggesting right atrial pressure of 3 mmHg.   Patient Profile     70 y.o. female with a hx of PAF (TEE/DCCV 7/21), sick sinus syndrome, Chronic systolic and diastolic CHF, HTN, HLD, and DM who is being seen today for the evaluation of tachycardia and heart failure at the request of Dr. Lum Babe.  Assessment & Plan    Atypical atrial flutter, with past history of paroxysmal atrial fibrillation -CHA2DS2/VAS Stroke Risk Points=5 -continue apixaban 5 mg BID -she adamantly declines cardioversion -we discussed amiodarone. Primary team has already started IV. Her biggest concern is for the size of the pill, so would consider changing to oral -would load with 200 mg oral BID amiodarone for two weeks, then change to 200 mg daily if she wishes to go home and declines IV.  Newly reduced LVEF -on metoprolol succinate -refuses entresto, ARB, spironolactone -she has been on enalapril in the past, Would consider adding back if she is amenable  Covid infection -on room air  Hypertension -blood pressure remains elevated, declines any additional medications   Type II diabetes -conside SGLT2i given reduced EF and diabetes. She tells me she will  not take any other medications. She is at least on GLP1RA as outpatient  Total time of encounter: 40 minutes total time of encounter, including 30 minutes spent in face-to-face patient care. This time includes coordination of care and counseling regarding options for management. Remainder of non-face-to-face time involved reviewing chart documents/testing relevant to the patient encounter and documentation in the medical record.  Jodelle Red, MD, PhD, [facility]   [facility]   For questions or updates, please contact [facility] Please consult [email or web address] for contact info under    Signed, Jodelle Red, MD  02/06/2020, 11:30 AM

## 2020-02-07 DIAGNOSIS — R0602 Shortness of breath: Secondary | ICD-10-CM

## 2020-02-07 LAB — CBC WITH DIFFERENTIAL/PLATELET
Abs Immature Granulocytes: 0.01 10*3/uL (ref 0.00–0.07)
Basophils Absolute: 0 10*3/uL (ref 0.0–0.1)
Basophils Relative: 0 %
Eosinophils Absolute: 0.2 10*3/uL (ref 0.0–0.5)
Eosinophils Relative: 3 %
HCT: 44.4 % (ref 36.0–46.0)
Hemoglobin: 13.6 g/dL (ref 12.0–15.0)
Immature Granulocytes: 0 %
Lymphocytes Relative: 37 %
Lymphs Abs: 1.8 10*3/uL (ref 0.7–4.0)
MCH: 24.8 pg — ABNORMAL LOW (ref 26.0–34.0)
MCHC: 30.6 g/dL (ref 30.0–36.0)
MCV: 81 fL (ref 80.0–100.0)
Monocytes Absolute: 0.5 10*3/uL (ref 0.1–1.0)
Monocytes Relative: 11 %
Neutro Abs: 2.3 10*3/uL (ref 1.7–7.7)
Neutrophils Relative %: 49 %
Platelets: 244 10*3/uL (ref 150–400)
RBC: 5.48 MIL/uL — ABNORMAL HIGH (ref 3.87–5.11)
RDW: 14.4 % (ref 11.5–15.5)
WBC: 4.9 10*3/uL (ref 4.0–10.5)
nRBC: 0 % (ref 0.0–0.2)

## 2020-02-07 LAB — COMPREHENSIVE METABOLIC PANEL
ALT: 44 U/L (ref 0–44)
AST: 28 U/L (ref 15–41)
Albumin: 2.3 g/dL — ABNORMAL LOW (ref 3.5–5.0)
Alkaline Phosphatase: 73 U/L (ref 38–126)
Anion gap: 9 (ref 5–15)
BUN: 15 mg/dL (ref 8–23)
CO2: 23 mmol/L (ref 22–32)
Calcium: 8.5 mg/dL — ABNORMAL LOW (ref 8.9–10.3)
Chloride: 103 mmol/L (ref 98–111)
Creatinine, Ser: 0.83 mg/dL (ref 0.44–1.00)
GFR, Estimated: >60 mL/min (ref >60)
Glucose, Bld: 167 mg/dL — ABNORMAL HIGH (ref 70–99)
Potassium: 4.4 mmol/L (ref 3.5–5.1)
Sodium: 135 mmol/L (ref 135–145)
Total Bilirubin: 0.5 mg/dL (ref 0.3–1.2)
Total Protein: 6 g/dL — ABNORMAL LOW (ref 6.5–8.1)

## 2020-02-07 LAB — GLUCOSE, CAPILLARY
Glucose-Capillary: 143 mg/dL — ABNORMAL HIGH (ref 70–99)
Glucose-Capillary: 142 mg/dL — ABNORMAL HIGH (ref 70–99)

## 2020-02-07 MED ORDER — AMIODARONE HCL 200 MG PO TABS
200.0000 mg | ORAL_TABLET | Freq: Two times a day (BID) | ORAL | Status: DC
  Administered 2020-02-07: 200 mg via ORAL
  Filled 2020-02-07: qty 1

## 2020-02-07 MED ORDER — AMIODARONE HCL 200 MG PO TABS
200.0000 mg | ORAL_TABLET | Freq: Every day | ORAL | Status: DC
Start: 2020-02-21 — End: 2020-02-07

## 2020-02-07 MED ORDER — AMIODARONE HCL 200 MG PO TABS: 200.0000 mg | ORAL_TABLET | Freq: Two times a day (BID) | ORAL | 0 refills | Status: DC

## 2020-02-07 MED ORDER — AMIODARONE HCL 200 MG PO TABS: 200.0000 mg | ORAL_TABLET | Freq: Two times a day (BID) | ORAL | Status: DC

## 2020-02-07 MED ORDER — INSULIN GLARGINE 100 UNIT/ML SOLOSTAR PEN: 18.0000 [IU] | PEN_INJECTOR | Freq: Every day | SUBCUTANEOUS | 0 refills | Status: DC

## 2020-02-07 MED ORDER — AMIODARONE HCL 200 MG PO TABS: 200.0000 mg | ORAL_TABLET | Freq: Every day | ORAL | 0 refills | Status: DC

## 2020-02-07 MED ORDER — NYSTATIN 100000 UNIT/ML MT SUSP: 5.0000 mL | Freq: Four times a day (QID) | OROMUCOSAL | 0 refills | Status: AC

## 2020-02-07 MED ORDER — METOPROLOL SUCCINATE ER 100 MG PO TB24: 100.0000 mg | ORAL_TABLET | Freq: Two times a day (BID) | ORAL | 0 refills | Status: DC

## 2020-02-07 MED ORDER — AMIODARONE HCL 200 MG PO TABS: 200.0000 mg | ORAL_TABLET | Freq: Every day | ORAL | Status: DC

## 2020-02-07 NOTE — Progress Notes (Signed)
Brief cardiology progress note:  Remains in 2:1 atypical atrial flutter, but started on amiodarone 02/06/20. This will likely take some time to have an effect.  [facility] will sign off.   Medication Recommendations:  Start amiodarone 200 mg BID for two weeks, then 200 mg daily. Continue apixaban 5 mg BID, metoprolol succinate 150 mg daily. May be able to reduce this dose as an outpatient if she returns to sinus rhythm Other recommendations (labs, testing, etc):  None Follow up as an outpatient: She has an appt with Tereso Newcomer on 03/05/20  Jodelle Red, MD, PhD, [facility]  [facility]  [address], [address] [place], [place] [address] [phone]

## 2020-02-07 NOTE — Discharge Instructions (Signed)
[name] [name],   It has been a pleasure taking care of you in the hospital. Our biggest goal for you right now is to get your heart rate under control. The metoprolol and amiodarone are the medications that will help you with this. Please note that you will be taking each of these twice daily for now. In a few weeks, you will reduce the amiodarone to once per day and we may also reduce your metoprolol to once daily at a future visit.  For your thrush, please continue to use the oral rinse for three more days. We are going to have you see Dr. Mauri Reading, one of Dr. Claris Che partners, on Tuesday, 2/1 at 3:10pm.  You also have an appointment on 2/23 at 11:15am with cardiology.   In the meantime, if you have worsening chest pain, shortness of breath, or loss of consciousness, please come back to the emergency room.   We are so glad you are feeling better!     Atrial Flutter  Atrial flutter is a type of abnormal heart rhythm (arrhythmia). The heart has an electrical system that tells it how to beat. In atrial flutter, the signals move rapidly in the top chambers of the heart (the atria). This makes your heart beat very fast. Atrial flutter can come and go, or it can be permanent. The goal of treatment is to prevent blood clots from forming, control your heart rate, or restore your heartbeat to a normal rhythm. If this condition is not treated, it can cause serious problems, such as a weakened heart muscle (cardiomyopathy) or a stroke. What are the causes? This condition is often caused by conditions that damage the heart's electrical system. These include:  Heart conditions and heart surgery. These include heart attacks and open-heart surgery.  Lung problems, such as COPD or a blood clot in the lung (pulmonary embolism, or PE).  Poorly controlled high blood pressure (hypertension).  Overactive thyroid (hyperthyroidism).  Diabetes. In some cases, the cause of this condition is not known. What  increases the risk? You are more likely to develop this condition if:  You are an elderly adult.  You are a man.  You are overweight (obese).  You have obstructive sleep apnea.  You have a family history of atrial flutter.  You have diabetes.  You drink a lot of alcohol, especially binge drinking.  You use drugs, including cannabis.  You smoke. What are the signs or symptoms? Symptoms of this condition include:  A feeling that your heart is pounding or racing (palpitations).  Shortness of breath.  Chest pain.  Feeling dizzy or light-headed.  Fainting.  Low blood pressure (hypotension).  Fatigue.  Tiring easily during exercise or activity. In some cases, there are no symptoms. How is this diagnosed? This condition may be diagnosed with:  An electrocardiogram (ECG) to check electrical signals of the heart.  An ambulatory cardiac monitor to record your heart's activity for a few days.  An echocardiogram to create pictures of your heart.  A transesophageal echocardiogram (TEE) to create even better pictures of your heart.  A stress test to check your blood supply while you exercise.  Imaging tests, such as a CT scan or chest X-ray.  Blood tests. How is this treated? Treatment depends on underlying conditions and how you feel when you experience atrial flutter. This condition may be treated with:  Medicines to prevent blood clots or to treat heart rate or heart rhythm problems.  Electrical cardioversion to reset the heart's  rhythm.  Ablation to remove the heart tissue that sends abnormal signals.  Left atrial appendage closure to seal the area where blood clots can form. In some cases, underlying conditions will be treated. Follow these instructions at home: Medicines  Take over-the-counter and prescription medicines only as told by your health care provider.  Do not take any new medicines without talking to your health care provider.  If you are  taking blood thinners: ? Talk with your health care provider before you take any medicines that contain aspirin or NSAIDs, such as ibuprofen. These medicines increase your risk for dangerous bleeding. ? Take your medicine exactly as told, at the same time every day. ? Avoid activities that could cause injury or bruising, and follow instructions about how to prevent falls. ? Wear a medical alert bracelet or carry a card that lists what medicines you take. Lifestyle  Eat heart-healthy foods. Talk with a dietitian to make an eating plan that is right for you.  Do not use any products that contain nicotine or tobacco, such as cigarettes, e-cigarettes, and chewing tobacco. If you need help quitting, ask your health care provider.  Do not drink alcohol.  Do not use drugs, including cannabis.  Lose weight if you are overweight or obese.  Exercise regularly as instructed by your health care provider. General instructions  Do not use diet pills unless your health care provider approves. Diet pills may make heart problems worse.  If you have obstructive sleep apnea, manage your condition as told by your health care provider.  Keep all follow-up visits as told by your health care provider. This is important. Contact a health care provider if you:  Notice a change in the rate, rhythm, or strength of your heartbeat.  Are taking a blood thinner and you notice more bruising.  Have a sudden change in weight.  Tire more easily when you exercise or do heavy work. Get help right away if you have:  Pain or pressure in your chest.  Shortness of breath.  Fainting.  Increasing sweating with no known cause.  Side effects of blood thinners, such as blood in your vomit, stool, or urine, or bleeding that cannot stop.  Any symptoms of a stroke. "BE FAST" is an easy way to remember the main warning signs of a stroke: ? B - Balance. Signs are dizziness, sudden trouble walking, or loss of  balance. ? E - Eyes. Signs are trouble seeing or a sudden change in vision. ? F - Face. Signs are sudden weakness or numbness of the face, or the face or eyelid drooping on one side. ? A - Arms. Signs are weakness or numbness in an arm. This happens suddenly and usually on one side of the body. ? S - Speech. Signs are sudden trouble speaking, slurred speech, or trouble understanding what people say. ? T - Time. Time to call emergency services. Write down what time symptoms started.  Other signs of a stroke, such as: ? A sudden, severe headache with no known cause. ? Nausea or vomiting. ? Seizure.  These symptoms may represent a serious problem that is an emergency. Do not wait to see if the symptoms will go away. Get medical help right away. Call your local emergency services (911 in the [place] Do not drive yourself to the hospital. Summary  Atrial flutter is an abnormal heart rhythm that can give you symptoms of palpitations, shortness of breath, or fatigue.  Atrial flutter is often treated with medicines  to keep your heart in a normal rhythm and to prevent a stroke.  Get help right away if you cannot catch your breath, or have chest pain or pressure.  Get help right away if you have signs or symptoms of a stroke. This information is not intended to replace advice given to you by your health care provider. Make sure you discuss any questions you have with your health care provider. Document Revised: 06/21/2018 Document Reviewed: 06/21/2018 Elsevier Patient Education  2021 [employer].

## 2020-02-07 NOTE — TOC Transition Note (Signed)
Transition of Care (TOC) - CM/SW Discharge Note Donn Pierini RN, BSN Transitions of Care Unit 4E- RN Case Manager See Treatment Team for direct phone #    Patient Details  Name: [name] [name] MRN: [id number] Date of Birth: [birth year]  Transition [facility] CM/SW Contact:  Darrold Span, RN Phone Number: 02/07/2020, 5:07 PM   Clinical Narrative:    Pt has been cleared for discharge and stable for transition home, +COVID. Call made via telephone to pt's room to discuss [facility]- orders placed for HHRN/PT/aide- pt states she has never had HH before however is agreeable to services - not sure if she wants aide- informed pt she can decide later. Questions answered regarding HH services and choice offered Per CMS guidelines from medicare.gov website with star ratings- per pt she states she does not have a preference and defers to this Clinical research associate to secure agency on her behalf.   Call made to Amy with [facility] for [facility] needs- referral has been accepted. They will do start of care tomorrow.   Final next level of care: Home w Home Health Services Barriers to Discharge: No Barriers Identified   Patient Goals and CMS Choice Patient states their goals for this hospitalization and ongoing recovery are:: return home CMS Medicare.gov Compare Post Acute Care list provided to:: Patient Choice offered to / list presented to : Patient  Discharge Placement               Home with [facility]        Discharge Plan and Services   Discharge Planning Services: CM Consult Post Acute Care Choice: Home Health          DME Arranged: N/A DME Agency: NA       HH Arranged: RN,PT HH Agency: [facility] Date HH Agency Contacted: 02/07/20 Time HH Agency Contacted: 1630 Representative spoke with at [facility] Agency: Amy  Social Determinants of Health (SDOH) Interventions     Readmission Risk Interventions Readmission Risk Prevention Plan 02/07/2020  Post Dischage Appt Complete  Medication  Screening Complete  Transportation Screening Complete  Some recent data might be hidden

## 2020-02-07 NOTE — Progress Notes (Signed)
Discharge instructions (including medications) discussed with and copy provided to patient/caregiver given by Kathy Brown  

## 2020-02-07 NOTE — Discharge Summary (Signed)
[facility] Discharge Summary  Patient name: [name] [name] Medical record number: [id number] Date of birth: 19-Jun-1950 Age: 70 y.o. Gender: female Date of Admission: 02/03/2020  Date of Discharge: 02/07/20 Admitting Physician: Delora Fuel, MD  Primary Care Provider: Carollee Leitz, MD Consultants: Cardiology  Indication for Hospitalization: Atrial Flutter with RVR; COVID-19  Discharge Diagnoses/Problem List:  Atrial Flutter with RVR COVID-19 HFrEF- acute on chronic Hypertension DM2- uncontrolled Oral candidiasis- resolved  Disposition: Home with [facility] services  Discharge Condition: Ambulatory   Discharge Exam:  General: Alert and engaged, sitting up in bed eating breakfast CV: RRR, no m/r/g Respiratory: Lungs clear to auscultation bilaterally Abdomen: Soft, non-tender, non-distended Extremities: No LE edema, all extremities warm and well-perfused   Brief Hospital Course:  [name] [name] presented to [place] with 1 week of generalized fatigue/weakness. She was found to be COVID+ and in AFib with RVR and HR from the 120s-140s. She came to the [facility] and was admitted to the family medicine teaching service. Her COVID did not require any supplemental oxygen and she was not deemed a candidate for remdesivir given the duration of her symptoms.  For her Afib with RVR, she was treated with diltiazem, but once found to have worsening of her heart failure (LVEF 25-30%), she was transitioned to metoprolol and amiodarone for rate control. Cardiology was consulted and recommended cardioversion, which she adamantly refused. We increased her home metoprolol dose and achieved adequate rate control on an amiodarone drip and she was transitioned to PO. She was able to maintain rates <110 on PO and was discharged home on the following meds for rate control: -Metoprolol Succinate 200mg  daily (take 100mg  with breakfast and 100mg  with dinner per patient  preference) -Amiodarone 200mg  BID until 2/10 then 200mg  once daily. -Eliquis 5mg  BID  Items for PCP Follow-up: 1) Her A1c on admission was 10.4 despite adherence to her home regimen of Lantus and Ozempic. Recommend outpatient optimization of this regimen. 2) Blood pressures have been persistently elevated to 267T systolic. Patient reports not taking her home enalapril or amlodipine. She refused any further intervention in the hospital. Outpatient optimization recommended.  3) Please note the dosing change in her amiodarone after 2/5. She should go from taking 200mg  BID to taking 200mg  once daily.  4) Patient was found to have oral thrush on admission, was treated with Nystatin rinse. It is likely that this was related to her uncontrolled diabetes, but monitor for signs of immunosupression. HIV neg in July 2021. Not sexually active, no IVDU.     Significant Procedures:  Echocardiogram (results below)   Significant Labs and Imaging:  Recent Labs  Lab 02/05/20 0251 02/06/20 0110 02/07/20 0251  WBC 5.0 5.4 4.9  HGB 12.9 12.6 13.6  HCT 40.1 41.9 44.4  PLT 188 242 244   Recent Labs  Lab 02/03/20 1445 02/04/20 0657 02/05/20 0251 02/06/20 0110 02/07/20 0251  NA 138 134* 133* 135 135  K 4.2 4.3 4.2 4.0 4.4  CL 103 103 102 102 103  CO2 27 21* 23 25 23   GLUCOSE 133* 218* 165* 86 167*  BUN 15 12 14 15 15   CREATININE 0.92 0.69 0.75 0.82 0.83  CALCIUM 8.4* 8.1* 8.3* 8.7* 8.5*  MG  --  1.5*  --   --   --   ALKPHOS 91 77 75 76 73  AST 50* 36 32 30 28  ALT 73* 61* 51* 46* 44  ALBUMIN 2.8* 2.4* 2.2* 2.3* 2.3*  ECHO COMPLETE WITH IMAGING ENHANCING AGENT 02/04/2020  Narrative ECHOCARDIOGRAM REPORT    Patient Name:   [name] [name] Date of Exam: 02/04/2020 Medical Rec #:  [id number]         Height:       63.0 in Accession #:    [id number]        Weight:       165.0 lb Date of Birth:  31-Jan-1950        BSA:          1.782 m Patient Age:    41 years          BP:            105/80 mmHg Patient Gender: F                 HR:           120 bpm. Exam Location:  Inpatient  Procedure: 2D Echo, Cardiac Doppler, Color Doppler and Intracardiac Opacification Agent  Indications:    I48.91* Unspeicified atrial fibrillation  History:        Patient has prior history of Echocardiogram examinations, most recent 07/31/2019. CHF, Abnormal ECG, Arrythmias:Atrial Fibrillation; Risk Factors:Diabetes, Hypertension and Dyslipidemia.  Sonographer:    Roseanna Rainbow RDCS Referring Phys: [id number] ERIC CHEN   Sonographer Comments: Technically difficult study due to poor echo windows and patient is morbidly obese. Image acquisition challenging due to patient body habitus. Delayed for consult. IMPRESSIONS   1. Since the last study on 07/27/2019 LVEF has decreased, now severely impaired at 25-30% and diffuse hypokinesis. Severe concentric LVH. RVEF is at least moderately decreased. Evaluation for infiltrative cardiomyopathies such as cardiac amyloidosis should be considered. 2. Left ventricular ejection fraction, by estimation, is 25 to 30%. The left ventricle has severely decreased function. The left ventricle has no regional wall motion abnormalities. There is severe concentric left ventricular hypertrophy. Left ventricular diastolic function could not be evaluated. 3. Right ventricular systolic function is moderately reduced. The right ventricular size is moderately enlarged. There is moderately elevated pulmonary artery systolic pressure. The estimated right ventricular systolic pressure is 24.0 mmHg. 4. Left atrial size was mildly dilated. 5. The mitral valve is normal in structure. Mild mitral valve regurgitation. No evidence of mitral stenosis. 6. Tricuspid valve regurgitation is moderate to severe. 7. The aortic valve is normal in structure. Aortic valve regurgitation is not visualized. Mild to moderate aortic valve sclerosis/calcification is present, without any evidence of aortic  stenosis. 8. The inferior vena cava is normal in size with greater than 50% respiratory variability, suggesting right atrial pressure of 3 mmHg.  FINDINGS Left Ventricle: Left ventricular ejection fraction, by estimation, is 25 to 30%. The left ventricle has severely decreased function. The left ventricle has no regional wall motion abnormalities. Definity contrast agent was given IV to delineate the left ventricular endocardial borders. The left ventricular internal cavity size was normal in size. There is severe concentric left ventricular hypertrophy. Left ventricular diastolic function could not be evaluated due to atrial fibrillation. Left ventricular diastolic function could not be evaluated.  Right Ventricle: The right ventricular size is moderately enlarged. No increase in right ventricular wall thickness. Right ventricular systolic function is moderately reduced. There is moderately elevated pulmonary artery systolic pressure. The tricuspid regurgitant velocity is 2.97 m/s, and with an assumed right atrial pressure of 15 mmHg, the estimated right ventricular systolic pressure is 97.3 mmHg.  Left Atrium: Left atrial size was mildly dilated.  Right Atrium: Right atrial  size was normal in size.  Pericardium: Trivial pericardial effusion is present.  Mitral Valve: The mitral valve is normal in structure. Mild mitral valve regurgitation. No evidence of mitral valve stenosis.  Tricuspid Valve: The tricuspid valve is normal in structure. Tricuspid valve regurgitation is moderate to severe. No evidence of tricuspid stenosis.  Aortic Valve: The aortic valve is normal in structure. Aortic valve regurgitation is not visualized. Mild to moderate aortic valve sclerosis/calcification is present, without any evidence of aortic stenosis.  Pulmonic Valve: The pulmonic valve was normal in structure. Pulmonic valve regurgitation is mild. No evidence of pulmonic stenosis.  Aorta: The aortic root is  normal in size and structure.  Venous: The inferior vena cava is normal in size with greater than 50% respiratory variability, suggesting right atrial pressure of 3 mmHg.  IAS/Shunts: No atrial level shunt detected by color flow Doppler.   LEFT VENTRICLE PLAX 2D LVIDd:         3.90 cm LVIDs:         3.40 cm LV PW:         1.60 cm LV IVS:        1.85 cm LVOT diam:     2.00 cm LV SV:         22 LV SV Index:   12 LVOT Area:     3.14 cm  LV Volumes (MOD) LV vol d, MOD A2C: 49.3 ml LV vol d, MOD A4C: 41.9 ml LV vol s, MOD A2C: 40.3 ml LV vol s, MOD A4C: 33.3 ml LV SV MOD A2C:     9.0 ml LV SV MOD A4C:     41.9 ml LV SV MOD BP:      8.8 ml  IVC IVC diam: 2.00 cm  LEFT ATRIUM             Index       RIGHT ATRIUM           Index LA diam:        4.30 cm 2.41 cm/m  RA Area:     15.50 cm LA Vol (A2C):   46.1 ml 25.87 ml/m RA Volume:   36.00 ml  20.20 ml/m LA Vol (A4C):   64.6 ml 36.25 ml/m LA Biplane Vol: 58.2 ml 32.66 ml/m AORTIC VALVE             PULMONIC VALVE LVOT Vmax:   56.40 cm/s  PR End Diast Vel: 2.12 msec LVOT Vmean:  32.900 cm/s LVOT VTI:    0.069 m  AORTA Ao Root diam: 3.40 cm Ao Asc diam:  3.10 cm  MITRAL VALVE                TRICUSPID VALVE MV Area (PHT): 5.73 cm     TR Peak grad:   35.3 mmHg MV Decel Time: 133 msec     TR Vmax:        297.00 cm/s MV E velocity: 116.00 cm/s SHUNTS Systemic VTI:  0.07 m Systemic Diam: 2.00 cm  Ena Dawley MD Electronically signed by Ena Dawley MD Signature Date/Time: 02/04/2020/11:40:45 AM    Final   Results/Tests Pending at Time of Discharge: None  Discharge Medications:  Allergies as of 02/07/2020      Reactions   Fruit & Vegetable Daily [nutritional Supplements] Swelling, Other (See Comments)   All fruits cause tongue swelling and numbness Organic fruits are okay to eat   Other Swelling   Tree nuts cause tongue swelling and numbness  Peanut-containing Drug Products Swelling, Other (See  Comments)   Tongue swelling and numbness   Aspirin Nausea And Vomiting, Other (See Comments)   Cannot take due to liver issues Other reaction(s): Unknown   Tylenol [acetaminophen] Other (See Comments)   Cannot take due to liver issues      Medication List    STOP taking these medications   amLODipine 2.5 MG tablet Commonly known as: NORVASC   docusate sodium 100 MG capsule Commonly known as: COLACE   enalapril 5 MG tablet Commonly known as: VASOTEC     TAKE these medications   accu-chek soft touch lancets Once daily testing.   amiodarone 200 MG tablet Commonly known as: PACERONE Take 1 tablet (200 mg total) by mouth 2 (two) times daily for 12 days.   amiodarone 200 MG tablet Commonly known as: PACERONE Take 1 tablet (200 mg total) by mouth daily. Start taking on: February 21, 2020   Blood Pressure Kit Monitor blood pressure twice a day   Eliquis 5 MG Tabs tablet Generic drug: apixaban Take 5 mg by mouth 2 (two) times daily.   glucose blood test strip Commonly known as: Accu-Chek Aviva Use as instructed   insulin glargine 100 UNIT/ML Solostar Pen Commonly known as: LANTUS Inject 18 Units into the skin daily. What changed: when to take this   Magnesium Oxide 400 MG Caps Take 1 capsule (400 mg total) by mouth daily.   metoprolol succinate 100 MG 24 hr tablet Commonly known as: TOPROL-XL Take 1 tablet (100 mg total) by mouth 2 (two) times daily. ** DO NOT CRUSH **  (BETA BLOCKER) What changed:   when to take this  additional instructions   nystatin 100000 UNIT/ML suspension Commonly known as: MYCOSTATIN Take 5 mLs (500,000 Units total) by mouth 4 (four) times daily for 3 days. Swish in mouth and then spit out.   Ozempic (0.25 or 0.5 MG/DOSE) 2 MG/1.5ML Sopn Generic drug: Semaglutide(0.25 or 0.5MG /DOS) Inject 0.5 mg into the skin once a week. What changed: when to take this   PEN NEEDLES 31GX5/16" 31G X 8 MM Misc 1 each by Does not apply route at  bedtime. Use to inject Lantus Solostart insulin pen       Discharge Instructions: Please refer to Patient Instructions section of EMR for full details.  Patient was counseled important signs and symptoms that should prompt return to medical care, changes in medications, dietary instructions, activity restrictions, and follow up appointments.   Follow-Up Appointments: Cardiology with Richardson Dopp on 03/05/20    Pearla Dubonnet, Medical Student 02/07/2020, 2:04 PM

## 2020-02-07 NOTE — Progress Notes (Incomplete)
Family Medicine Teaching Service Daily Progress Note Intern Pager: [phone]  Patient name: [name] [name] Medical record number: [id number] Date of birth: [birth year] Age: 70 y.o. Gender: female  Primary Care Provider: Dana Allan, MD Consultants: Cards Code Status: Full  Pt Overview and Major Events to Date:  1/24- Admitted 1/25- Cardiology consulted, patient refused further intervention  Assessment and Plan: [name] [name] is a 70yo female with a history of Afib, T2DM, HFrEF who presents with 1 week of weakness, found to have COVID-19 and a-fib with RVR. Later found to have atypical A Flutter.   Atrial Flutter with RVR  Rates are controlled under 110 on amiodarone gtt. Patient is requesting discharge home. As long as she remains rate controlled on PO amiodarone, she should be able to discharge home today. Patient remains asymptomatic.  -Metoprolol succinate 150mg  daily - Transition to amiodarone PO 200mg  daily for two weeks, then 100mg  daily per cards rec - Cards following, grateful for their input  - Telemetry monitoring - Continue Eliquis5mg  BID  HFrEF  HTN Patient currently refusing any medication changes at this time. BPs have been persistently elevated to the 150s. Home med list included enalapril and amlodipine but patient reports she was not taking these. Patient voices understanding of risk of inadequately treating her BP.  - Metoprolol 150mg  dailyas above - Pt refuses further intervention  COVID+ Remains stable on room air. Did not initiate remdesevir as patient's symptoms began >7 days ago. -Tylenol 650mg  q6h prn for fever, pain  Poorly-controlled DM2 Fasting glucose 143 this morning. BG range 143-215 over past 24h.Received 10u SAI and 18u Lantus yesterday. Home meds:semaglutide 0.25 mg weekly.A1c 10.4on admission. - Lantus 18udaily - SSI - PCP follow-up for inadequate control   Oral Thrush Nearly resolved. Patient pleased with improvement. - Day  4/7 nystatin therapy  FEN/GI: Heart healthy PPx: On chronic Eliquis for AFib  Status is: Inpatient  {Inpatient:23812}  Dispo: The patient is from: {From:23814}              Anticipated d/c is to: {To:23815}              Anticipated d/c date is: {Days:23816}              Patient currently {Medically stable:23817}   Difficult to place patient {Yes/No:25151}        Subjective:  ***  Objective: Temp:  [97.5 F (36.4 C)-98.1 F (36.7 C)] 97.8 F (36.6 C) (01/27 0449) Pulse Rate:  [107-125] 107 (01/27 0449) Resp:  [16-24] 16 (01/27 0449) BP: (120-160)/(90-118) 130/99 (01/27 0449) SpO2:  [97 %-100 %] 98 % (01/27 0449) Physical Exam: General: *** Cardiovascular: *** Respiratory: *** Abdomen: *** Extremities: ***  Laboratory: Recent Labs  Lab 02/05/20 0251 02/06/20 0110 02/07/20 0251  WBC 5.0 5.4 4.9  HGB 12.9 12.6 13.6  HCT 40.1 41.9 44.4  PLT 188 242 244   Recent Labs  Lab 02/05/20 0251 02/06/20 0110 02/07/20 0251  NA 133* 135 135  K 4.2 4.0 4.4  CL 102 102 103  CO2 23 25 23   BUN 14 15 15   CREATININE 0.75 0.82 0.83  CALCIUM 8.3* 8.7* 8.5*  PROT 6.0* 6.1* 6.0*  BILITOT 0.8 0.5 0.5  ALKPHOS 75 76 73  ALT 51* 46* 44  AST 32 30 28  GLUCOSE 165* 86 167*    ***  Imaging/Diagnostic Tests: 02/08/20, Medical Student 02/07/2020, 6:42 AM PGY-***, 02/07/20 Health Family Medicine FPTS Intern pager: [phone]*, text pages welcome

## 2020-02-07 NOTE — Plan of Care (Signed)
  Problem: Education: Goal: Knowledge of General Education information will improve Description: Including pain rating scale, medication(s)/side effects and non-pharmacologic comfort measures Outcome: Adequate for Discharge   

## 2020-02-11 ENCOUNTER — Other Ambulatory Visit: Payer: Self-pay | Admitting: Family Medicine

## 2020-02-12 ENCOUNTER — Inpatient Hospital Stay: Payer: Medicare Other | Admitting: Family Medicine

## 2020-03-03 ENCOUNTER — Other Ambulatory Visit: Payer: Self-pay | Admitting: Family Medicine

## 2020-03-03 ENCOUNTER — Ambulatory Visit (HOSPITAL_COMMUNITY)
Admission: RE | Admit: 2020-03-03 | Discharge: 2020-03-03 | Disposition: A | Discharge status: Home / Self Care | Payer: Medicare Other | Source: Ambulatory Visit | Attending: Family Medicine | Admitting: Family Medicine

## 2020-03-03 ENCOUNTER — Other Ambulatory Visit: Payer: Self-pay

## 2020-03-03 ENCOUNTER — Encounter: Payer: Self-pay | Admitting: Family Medicine

## 2020-03-03 ENCOUNTER — Telehealth: Payer: Self-pay | Admitting: Family Medicine

## 2020-03-03 ENCOUNTER — Ambulatory Visit (INDEPENDENT_AMBULATORY_CARE_PROVIDER_SITE_OTHER): Payer: Medicare Other | Admitting: Family Medicine

## 2020-03-03 ENCOUNTER — Telehealth: Payer: Self-pay | Admitting: Cardiovascular Disease

## 2020-03-03 VITALS — BP 144/64 | HR 120 | Wt 209.4 lb

## 2020-03-03 DIAGNOSIS — I5042 Chronic combined systolic (congestive) and diastolic (congestive) heart failure: Secondary | ICD-10-CM

## 2020-03-03 DIAGNOSIS — E1165 Type 2 diabetes mellitus with hyperglycemia: Secondary | ICD-10-CM | POA: Diagnosis not present

## 2020-03-03 DIAGNOSIS — I4891 Unspecified atrial fibrillation: Secondary | ICD-10-CM

## 2020-03-03 MED ORDER — FUROSEMIDE 40 MG PO TABS: 40.0000 mg | ORAL_TABLET | Freq: Two times a day (BID) | ORAL | 0 refills | Status: DC

## 2020-03-03 NOTE — Telephone Encounter (Signed)
PT pcp called in and need to fu with Dr Robet Leu office asap for heart failure and SOB , Decreased ejection fraction.  Pt expressed to them that she does not want to see a PA, Dr Elease Hashimoto only .     Best number for PCP  [phone] Or please contact pt with the appt.

## 2020-03-03 NOTE — Patient Instructions (Addendum)
Thank you for coming to see me today. It was a pleasure.   We will get some labs today.  If they are abnormal or we need to do something about them, I will call you.  If they are normal, I will send you a message on MyChart (if it is active) or a letter in the mail.  If you don't hear from [place] in 2 weeks, please call the office at the number below.  I have placed an order for chest xray.  Please go to [facility] at [employer] or at [facility] to have this completed.  You do not need an appointment, but if you would like to call them beforehand, their number is [phone].  We will contact you with your results afterwards.   Take Lasix 40 mg twice a day. Take Metoprolol 100 mg twice a day as scheduled  It is important for you to follow up on Wed at the clinic.  You will need to see a cardiologist and they will be contacting you as soon as possible with an appointment.   If you develop fevers>100.5, shortness of breath, chest pain, palpitations, dizziness, abdominal pain, nausea, vomiting, diarrhea or cannot eat or drink then please go to the ER immediately.   If you have any questions or concerns, please do not hesitate to call the office at [phone].  Best,   Dana Allan, MD     Heart Failure Action Plan A heart failure action plan helps you understand what to do when you have symptoms of heart failure. Your action plan is a color-coded plan that lists the symptoms to watch for and indicates what actions to take.  If you have symptoms in the red zone, you need medical care right away.  If you have symptoms in the yellow zone, you are having problems.  If you have symptoms in the green zone, you are doing well. Follow the plan that was created by you and your health care provider. Review your plan each time you visit your health care provider. Red zone These signs and symptoms mean you should get medical help right away:  You have trouble breathing  when resting.  You have a dry cough that is getting worse.  You have swelling or pain in your legs or abdomen that is getting worse.  You suddenly gain more than 2-3 lb (0.9-1.4 kg) in 24 hours, or more than 5 lb (2.3 kg) in a week. This amount may be more or less depending on your condition.  You have trouble staying awake or you feel confused.  You have chest pain.  You do not have an appetite.  You pass out.  You have worsening sadness or depression. If you have any of these symptoms, call your local emergency services (911 in the [place] right away. Do not drive yourself to the hospital.   Yellow zone These signs and symptoms mean your condition may be getting worse and you should make some changes:  You have trouble breathing when you are active, or you need to sleep with your head raised on extra pillows to help you breathe.  You have swelling in your legs or abdomen.  You gain 2-3 lb (0.9-1.4 kg) in 24 hours, or 5 lb (2.3 kg) in a week. This amount may be more or less depending on your condition.  You get tired easily.  You have trouble sleeping.  You have a dry cough. If you have any of  these symptoms:  Contact your health care provider within the next day.  Your health care provider may adjust your medicines.   Green zone These signs mean you are doing well and can continue what you are doing:  You do not have shortness of breath.  You have very little swelling or no new swelling.  Your weight is stable (no gain or loss).  You have a normal activity level.  You do not have chest pain or any other new symptoms.    Where to find more information  American Heart Association: www.heart.org Summary  A heart failure action plan helps you understand what to do when you have symptoms of heart failure.  Follow the action plan that was created by you and your health care provider.  Get help right away if you have any symptoms in the red zone. This information  is not intended to replace advice given to you by your health care provider. Make sure you discuss any questions you have with your health care provider. Document Revised: 08/13/2019 Document Reviewed: 08/13/2019 Elsevier Patient Education  2021 [employer].

## 2020-03-03 NOTE — Progress Notes (Signed)
    SUBJECTIVE:   CHIEF COMPLAINT / HPI: hospital follow up  Patient admitted to hospital between 01/23 and 01/27 and treated for COVID 19, Afib RVR. Discharged on 01/27. Presents today for follow up because she is concerned by the increase in lower extremity edema.  Most recent ECHO shows a decline in EF 25-30% with severe diffuse hypokinesis and severe concentric LVH.  RVEF is at least moderately decreased and it is recommended evaluation for infiltrative cardiomyopathies.  Unfortunately she has cancelled multiple appointments with Cardiology.  Current medication includes Metoprolol 100 mg daily, ordered BID and Eliquis 5 mg BID. She has not been taking Amiodarone as was ordered on discharge.  Diabetes No hypoglycemic events.  Does not check sugars at home.  States compliance with Ozempic 0.5 mg weekly and Lantus 18 u daily.  Last A1c 10.4 in 02/04/19.    PERTINENT  PMH / PSH:  Recent COVID 19 PNA DM Type 2 Afib on Eliquis HFrEF    OBJECTIVE:   BP (!) 144/64   Pulse (!) 120   Wt 209 lb 6.4 oz (95 kg)   SpO2 99%   BMI 37.09 kg/m    General: Alert, no acute distress Cardio: Tachycardic and regular, HR 129, no murmurs appreciated, JVD appears elevated. Pulm: CTAB, diminished at bases, Speaks in short sentences,  Abdomen: Bowel sounds normal. Abdominal anasarca, soft and non tender Extremities: pitting edema extending to thighs bilaterally    ASSESSMENT/PLAN:   Chronic combined systolic and diastolic CHF (congestive heart failure) (HCC) Worsening shortness of breath.  Increase in weight of 47 lbs since 02/03/19.  Increase in lower extremity edema bilaterally up to thigh.  Denies any chest pain.  I initially recommended hospitalization for aggressive diuresing.  Patient does not want to be hospitalized.  I discussed my concerns of her decrease in EF from 40-45% to 25-30% in 6 months.  She has cancelled many cardiology appointments.  She is agreeable to see a different  cardiologist.   -ECG shows ST without STEMI  -Will start Lasix 40 mg BID -Advised to continue Metoprolol 100 mg BID as scheduled as she was only taking daily. -Bmet, BNP and CBC today -Chest xray today -I have sent another referral to [facility], Dr Tawanna Cooler, as patient is requesting a female provider.  -Strict return precautions -Follow up appointment scheduled for Wed afternoon, repeat Bmet and weight at next visit.  Uncontrolled type 2 diabetes mellitus with hyperglycemia (HCC) Last A1c 10.4, completed last month.  Discussed importance of continuing medications.  Patient manages her own medication how she likes them managed as she is well aware of her body and is very sensitive to medications. -Repeat A1c  In 2/12 -Strict return precautions provided -Follow up as needed     Dana Allan, MD [facility]

## 2020-03-03 NOTE — Progress Notes (Signed)
Called to schedule with cardiology. Limited availability as patient does not want to see PA. Office will reach out to schedule patient with cardiologist as soon as possible.

## 2020-03-03 NOTE — Telephone Encounter (Signed)
I placed new referral to [facility] cardiology.  Please call to see if patient can get quicker appointment with Dr Tawanna Cooler.   Thanks  Kenney Houseman

## 2020-03-03 NOTE — Assessment & Plan Note (Addendum)
Worsening shortness of breath.  Increase in weight of 47 lbs since 02/03/19.  Increase in lower extremity edema bilaterally up to thigh.  Denies any chest pain.  I initially recommended hospitalization for aggressive diuresing.  Patient does not want to be hospitalized.  I discussed my concerns of her decrease in EF from 40-45% to 25-30% in 6 months.  She has cancelled many cardiology appointments.  She is agreeable to see a different cardiologist.   -ECG shows ST without STEMI  -Will start Lasix 40 mg BID -Advised to continue Metoprolol 100 mg BID as scheduled as she was only taking daily. -Bmet, BNP and CBC today -Chest xray today -I have sent another referral to [facility], Dr Tawanna Cooler, as patient is requesting a female provider.  -Strict return precautions -Follow up appointment scheduled for Wed afternoon, repeat Bmet and weight at next visit.

## 2020-03-03 NOTE — Telephone Encounter (Signed)
Please cancel appointment if patient refusing.   Thanks Kenney Houseman

## 2020-03-03 NOTE — Telephone Encounter (Signed)
[facility] office called stating the patient is not accepting appointment to be seen there by doctor.. They are calling to see if doctor Clent Ridges can call patient to inform her. Appointment is already made. Please advise. Thanks!

## 2020-03-03 NOTE — Assessment & Plan Note (Signed)
Last A1c 10.4, completed last month.  Discussed importance of continuing medications.  Patient manages her own medication how she likes them managed as she is well aware of her body and is very sensitive to medications. -Repeat A1c  In 2/12 -Strict return precautions provided -Follow up as needed

## 2020-03-03 NOTE — Telephone Encounter (Signed)
Call placed to Pt to advise of appt made with Dr. Elease Hashimoto on 03/10/20.  Pt asks if this is the doctor in [facility].  Advised this was the doctor she had seen in [place] across from [facility].  Pt states "oh no I'm not coming to see him".  She states her doctor's name starts with a "Wy" she can't remember.    Advised per PCP wanted Pt to see Dr. Elease Hashimoto.  Pt again refuses.  Call placed to Pt's PCP to advise Pt is refusing follow up with Dr. Elease Hashimoto.  Appt still in epic.  If Pt does not want appt please cancel.

## 2020-03-04 ENCOUNTER — Telehealth: Payer: Self-pay | Admitting: Cardiovascular Disease

## 2020-03-04 LAB — CBC WITH DIFFERENTIAL/PLATELET
Basophils Absolute: 0 10*3/uL (ref 0.0–0.2)
Basos: 1 %
EOS (ABSOLUTE): 0.1 10*3/uL (ref 0.0–0.4)
Eos: 2 %
Hematocrit: 45.6 % (ref 34.0–46.6)
Hemoglobin: 14 g/dL (ref 11.1–15.9)
Immature Grans (Abs): 0 10*3/uL (ref 0.0–0.1)
Immature Granulocytes: 0 %
Lymphocytes Absolute: 2.4 10*3/uL (ref 0.7–3.1)
Lymphs: 34 %
MCH: 25.2 pg — ABNORMAL LOW (ref 26.6–33.0)
MCHC: 30.7 g/dL — ABNORMAL LOW (ref 31.5–35.7)
MCV: 82 fL (ref 79–97)
Monocytes Absolute: 0.7 10*3/uL (ref 0.1–0.9)
Monocytes: 10 %
Neutrophils Absolute: 3.7 10*3/uL (ref 1.4–7.0)
Neutrophils: 53 %
Platelets: 281 10*3/uL (ref 150–450)
RBC: 5.56 x10E6/uL — ABNORMAL HIGH (ref 3.77–5.28)
RDW: 13.9 % (ref 11.7–15.4)
WBC: 7 10*3/uL (ref 3.4–10.8)

## 2020-03-04 LAB — BASIC METABOLIC PANEL
BUN/Creatinine Ratio: 17 (ref 12–28)
BUN: 16 mg/dL (ref 8–27)
CO2: 21 mmol/L (ref 20–29)
Calcium: 9.1 mg/dL (ref 8.7–10.3)
Chloride: 104 mmol/L (ref 96–106)
Creatinine, Ser: 0.96 mg/dL (ref 0.57–1.00)
GFR calc Af Amer: 70 mL/min/{1.73_m2} (ref >59)
GFR calc non Af Amer: 61 mL/min/{1.73_m2} (ref >59)
Glucose: 161 mg/dL — ABNORMAL HIGH (ref 65–99)
Potassium: 5.1 mmol/L (ref 3.5–5.2)
Sodium: 138 mmol/L (ref 134–144)

## 2020-03-04 LAB — BRAIN NATRIURETIC PEPTIDE: BNP: 1981.9 pg/mL — ABNORMAL HIGH (ref 0.0–100.0)

## 2020-03-04 NOTE — Telephone Encounter (Signed)
[facility] Client is calling stating the patient is requesting a provider switch from Dr. Elease Hashimoto to Dr. Servando Salina in [place] due to wanting a female provider and not wanting to be seen at the [facility] office any longer. [facility] Client is requesting a callback to schedule once approved. Please advise.

## 2020-03-04 NOTE — Telephone Encounter (Signed)
That is fine with me.

## 2020-03-04 NOTE — Telephone Encounter (Signed)
Called [facility] on [address] regarding appointment. Canceled appointment with Dr. Elease Hashimoto, as patient was refusing to be seen in [place] office.   Spoke with Morrie Sheldon at [facility] this morning. Advised that new referral is not needed and that message would be sent for switch of provider within [facility] system.   Will await response for scheduling.   Veronda Prude, RN

## 2020-03-05 ENCOUNTER — Ambulatory Visit: Payer: Medicare Other | Admitting: Physician Assistant

## 2020-03-05 ENCOUNTER — Ambulatory Visit (INDEPENDENT_AMBULATORY_CARE_PROVIDER_SITE_OTHER): Payer: Medicare Other | Admitting: Student in an Organized Health Care Education/Training Program

## 2020-03-05 ENCOUNTER — Other Ambulatory Visit: Payer: Self-pay

## 2020-03-05 VITALS — BP 140/70 | HR 136 | Ht 63.0 in | Wt 206.8 lb

## 2020-03-05 DIAGNOSIS — I5042 Chronic combined systolic (congestive) and diastolic (congestive) heart failure: Secondary | ICD-10-CM | POA: Diagnosis not present

## 2020-03-05 NOTE — Progress Notes (Signed)
   SUBJECTIVE:   CHIEF COMPLAINT / HPI: f/u CHF, DM  CHF- patient had recent follow up with PCP in regards to her hospitalization for Afib RVR. At that appointment she was asymptomatic but not taking her medications as prescribed at discharge and was tachycardic to 120s. ECG showed sinus rhythm. PCP recommended she take her medications as prescribed. Since that appointment, patient has only been taking metoprolol 100mg  daily and lasix BID- which has had a significant impact on urine output and improvement in her lower extremity swelling. (prescribed metoprolol BID plus amiodarone 200mg  daily) Patient has follow up with cardiology scheduled in less than 2 weeks. Her daughter comes with her today as she has a lot of questions about the course of treatment in the hospital as well as where to go from here. She shows a lot of concern for her mother's health.  Review of Systems  Constitutional: Positive for malaise/fatigue. Negative for diaphoresis and fever.  Respiratory: Positive for shortness of breath (with exertion and has been improving). Negative for cough.   Cardiovascular: Positive for orthopnea (elevated on 2 pillows or in a recliner) and leg swelling (endorses much improved since last appointment). Negative for chest pain and palpitations.  Gastrointestinal: Negative for abdominal pain, diarrhea, nausea and vomiting.   OBJECTIVE:   BP 140/70   Pulse (!) 136   Ht 5\' 3"  (1.6 m)   Wt 206 lb 12.8 oz (93.8 kg)   SpO2 100%   BMI 36.63 kg/m   Repeat pulse by provider after patient had been sitting for a few minutes was ~125bpm  General: NAD, pleasant, able to participate in exam Cardiac: tachycardic, regular rhythm, did not appreciate a murmur. Positive for JVD. No carotid bruit. Quick cap refill.  Respiratory: CTAB, normal effort, No wheezes, rales or rhonchi Abdomen: soft, nontender, nondistended, no hepatic or splenomegaly, +BS Extremities: moderate, non-pitting edema. WWP. Compression  stockings in place Skin: warm and dry, no rashes noted Neuro: alert and oriented Psych: Normal affect and mood  ASSESSMENT/PLAN:   Chronic combined systolic and diastolic CHF (congestive heart failure) (HCC) Patient is asymptomatic and plans to follow up with cardiology.  Has not been taking prescribed medications correctly. She discontinued amiodarone and only taking daily metoprolol. Adherent with lasix BID. - recommended patient take metoprolol BID and she and daughter agreed.  - continue lasix BID and take earlier in the day for prevention of sleep interruption with nocturia.  - recommended amiodarone  - spent considerable amount of time with patient and daughter describing the nature of her disease and the cause of her symptoms, treatments, and implications of non-adherence with the medications. The daughter seems to have good influence on mother for encouraging adherence with the treatment plan. Would be good to keep her in the loop for further treatment changes - CCM referral placed for patient request to have hospital bed to help with her PND   , DO [facility]

## 2020-03-05 NOTE — Patient Instructions (Signed)
It was a pleasure to see you today!  To summarize our discussion for this visit:  Your heart rate is still too fast and is contributing to your swelling in legs and lungs. Please take your metoprolol TWICE per day as well as your lasix twice per day.   You can follow up with cardiology at your nearest appointment, or if you have any concerns sooner, please don't hesitate to come back and let us know.   Please go to the emergency department if you feel chest pain associated with shortness of breath or have episode that it feels like you are going to pass out.   Some additional health maintenance measures we should update are: Health Maintenance Due  Topic Date Due  . FOOT EXAM  Never done  . OPHTHALMOLOGY EXAM  Never done  . COLONOSCOPY (Pts 45-77yrs Insurance coverage will need to be confirmed)  Never done  . URINE MICROALBUMIN  09/07/2018  . MAMMOGRAM  09/10/2019  . COVID-19 Vaccine (3 - Booster for Moderna series) 02/07/2020  .    Call the clinic at [phone] if your symptoms worsen or you have any concerns.   Thank you for allowing me to take part in your care,  Dr. Jamelle Rushing

## 2020-03-05 NOTE — Telephone Encounter (Signed)
That would be fine with me.  

## 2020-03-06 LAB — BASIC METABOLIC PANEL
BUN/Creatinine Ratio: 16 (ref 12–28)
BUN: 17 mg/dL (ref 8–27)
CO2: 24 mmol/L (ref 20–29)
Calcium: 8.9 mg/dL (ref 8.7–10.3)
Chloride: 102 mmol/L (ref 96–106)
Creatinine, Ser: 1.08 mg/dL — ABNORMAL HIGH (ref 0.57–1.00)
GFR calc Af Amer: 61 mL/min/{1.73_m2} (ref >59)
GFR calc non Af Amer: 53 mL/min/{1.73_m2} — ABNORMAL LOW (ref >59)
Glucose: 197 mg/dL — ABNORMAL HIGH (ref 65–99)
Potassium: 4.8 mmol/L (ref 3.5–5.2)
Sodium: 139 mmol/L (ref 134–144)

## 2020-03-07 NOTE — Assessment & Plan Note (Addendum)
Patient is asymptomatic and plans to follow up with cardiology.  Has not been taking prescribed medications correctly. She discontinued amiodarone and only taking daily metoprolol. Adherent with lasix BID. - recommended patient take metoprolol BID and she and daughter agreed.  - continue lasix BID and take earlier in the day for prevention of sleep interruption with nocturia.  - recommended amiodarone  - spent considerable amount of time with patient and daughter describing the nature of her disease and the cause of her symptoms, treatments, and implications of non-adherence with the medications. The daughter seems to have good influence on mother for encouraging adherence with the treatment plan. Would be good to keep her in the loop for further treatment changes - CCM referral placed for patient request to have hospital bed to help with her PND

## 2020-03-10 ENCOUNTER — Ambulatory Visit: Payer: Medicare Other | Admitting: Cardiovascular Disease

## 2020-03-12 ENCOUNTER — Other Ambulatory Visit: Payer: Self-pay

## 2020-03-12 DIAGNOSIS — Z72820 Sleep deprivation: Secondary | ICD-10-CM | POA: Insufficient documentation

## 2020-03-12 DIAGNOSIS — E1159 Type 2 diabetes mellitus with other circulatory complications: Secondary | ICD-10-CM | POA: Insufficient documentation

## 2020-03-12 DIAGNOSIS — K219 Gastro-esophageal reflux disease without esophagitis: Secondary | ICD-10-CM | POA: Insufficient documentation

## 2020-03-12 DIAGNOSIS — R5383 Other fatigue: Secondary | ICD-10-CM | POA: Insufficient documentation

## 2020-03-12 DIAGNOSIS — R160 Hepatomegaly, not elsewhere classified: Secondary | ICD-10-CM | POA: Insufficient documentation

## 2020-03-12 DIAGNOSIS — Z972 Presence of dental prosthetic device (complete) (partial): Secondary | ICD-10-CM | POA: Insufficient documentation

## 2020-03-12 DIAGNOSIS — K08109 Complete loss of teeth, unspecified cause, unspecified class: Secondary | ICD-10-CM | POA: Insufficient documentation

## 2020-03-12 DIAGNOSIS — I10 Essential (primary) hypertension: Secondary | ICD-10-CM | POA: Insufficient documentation

## 2020-03-12 DIAGNOSIS — E119 Type 2 diabetes mellitus without complications: Secondary | ICD-10-CM | POA: Insufficient documentation

## 2020-03-12 DIAGNOSIS — Z973 Presence of spectacles and contact lenses: Secondary | ICD-10-CM | POA: Insufficient documentation

## 2020-03-18 ENCOUNTER — Encounter: Payer: Self-pay | Admitting: Cardiology

## 2020-03-18 ENCOUNTER — Other Ambulatory Visit: Payer: Self-pay

## 2020-03-18 ENCOUNTER — Ambulatory Visit (INDEPENDENT_AMBULATORY_CARE_PROVIDER_SITE_OTHER): Payer: Medicare Other | Admitting: Cardiology

## 2020-03-18 ENCOUNTER — Telehealth: Payer: Self-pay | Admitting: *Deleted

## 2020-03-18 VITALS — BP 130/100 | HR 90 | Ht 63.0 in | Wt 200.0 lb

## 2020-03-18 DIAGNOSIS — I5032 Chronic diastolic (congestive) heart failure: Secondary | ICD-10-CM

## 2020-03-18 DIAGNOSIS — E1165 Type 2 diabetes mellitus with hyperglycemia: Secondary | ICD-10-CM | POA: Diagnosis not present

## 2020-03-18 DIAGNOSIS — I480 Paroxysmal atrial fibrillation: Secondary | ICD-10-CM

## 2020-03-18 DIAGNOSIS — I361 Nonrheumatic tricuspid (valve) insufficiency: Secondary | ICD-10-CM | POA: Insufficient documentation

## 2020-03-18 DIAGNOSIS — I5020 Unspecified systolic (congestive) heart failure: Secondary | ICD-10-CM

## 2020-03-18 DIAGNOSIS — R600 Localized edema: Secondary | ICD-10-CM | POA: Diagnosis not present

## 2020-03-18 HISTORY — DX: Localized edema: R60.0

## 2020-03-18 HISTORY — DX: Chronic diastolic (congestive) heart failure: I50.32

## 2020-03-18 HISTORY — DX: Unspecified systolic (congestive) heart failure: I50.20

## 2020-03-18 HISTORY — DX: Nonrheumatic tricuspid (valve) insufficiency: I36.1

## 2020-03-18 MED ORDER — METOLAZONE 5 MG PO TABS: ORAL_TABLET | ORAL | 0 refills | Status: DC

## 2020-03-18 MED ORDER — METOLAZONE 5 MG PO TABS: 5.0000 mg | ORAL_TABLET | Freq: Every day | ORAL | 3 refills | Status: DC

## 2020-03-18 NOTE — Telephone Encounter (Signed)
Patient left message on referral line asking for an order to be placed with [facility] for personal care services.  Clinical portion of form completed and emailed to provider.  Please print, complete and fax to number on the form.  Thanks [employer]

## 2020-03-18 NOTE — Patient Instructions (Signed)
Medication Instructions:  Your physician has recommended you make the following change in your medication:  START: Metolazone 5 mg for 7 days 30 minutes before Lasix  *If you need a refill on your cardiac medications before your next appointment, please call your pharmacy*   Lab Work: Your physician recommends that you return for lab work: TODAY:BMET, Mag  If you have labs (blood work) drawn today and your tests are completely normal, you will receive your results only by: Marland Kitchen MyChart Message (if you have MyChart) OR . A paper copy in the mail If you have any lab test that is abnormal or we need to change your treatment, we will call you to review the results.   Testing/Procedures: None   Follow-Up: At [facility], you and your health needs are our priority.  As part of our continuing mission to provide you with exceptional heart care, we have created designated Provider Care Teams.  These Care Teams include your primary Cardiologist (physician) and Advanced Practice Providers (APPs -  Physician Assistants and Nurse Practitioners) who all work together to provide you with the care you need, when you need it.  We recommend signing up for the patient portal called "MyChart".  Sign up information is provided on this After Visit Summary.  MyChart is used to connect with patients for Virtual Visits (Telemedicine).  Patients are able to view lab/test results, encounter notes, upcoming appointments, etc.  Non-urgent messages can be sent to your provider as well.   To learn more about what you can do with MyChart, go to [email or web address].    Your next appointment:   4 week(s)  The format for your next appointment:   In Person  Provider:   Thomasene Ripple, DO   Other Instructions

## 2020-03-18 NOTE — Progress Notes (Signed)
Cardiology Office Note:    Date:  03/18/2020   ID:  [name] [name], DOB 1950/04/10, MRN [id number]  PCP:  Carollee Leitz, MD  Cardiologist:  Berniece Salines, DO  Electrophysiologist:  None   Referring MD: Zenia Resides, MD     History of Present Illness:    [name] [name] is a 70 y.o. female with a hx of uncontrolled diabetes mellitus, hypertension, heart failure with reduced ejection fraction recent EF on February 04, 2020 was 25 to 30%, atrial fibrillation on Eliquis and amiodarone, moderate pulmonary hypertension.  The patient is here today for follow-up visit as she has transferred care from our [facility] office with Dr. Acie Fredrickson to [facility].  She is here today with his daughter.  She tells me that her biggest problem is the fact that she is experiencing significant bilateral leg edema.  She denies any chest pain or shortness of breath.  Past Medical History:  Diagnosis Date  . Abdominal pain   . Back pain   . Chronic combined systolic and diastolic CHF (congestive heart failure) ([place]) 02/04/2020  . Constipation   . Diabetes mellitus without complication ([place])   . Fatigue   . Full dentures   . Hemorrhoids   . Hypertension   . Lack of adequate sleep   . Liver mass   . Rash    on right breast  . Wears glasses     Past Surgical History:  Procedure Laterality Date  . BREAST DUCTAL SYSTEM EXCISION    . BREAST EXCISIONAL BIOPSY Left   . CARDIOVERSION N/A 07/31/2019   Procedure: CARDIOVERSION;  Surgeon: Pixie Casino, MD;  Location: [facility] ENDOSCOPY;  Service: Cardiovascular;  Laterality: N/A;  . CHOLECYSTECTOMY    . CHOLECYSTECTOMY, LAPAROSCOPIC  07/24/2010  . ESOPHAGEAL DILATION    . TEE WITHOUT CARDIOVERSION N/A 07/31/2019   Procedure: TRANSESOPHAGEAL ECHOCARDIOGRAM (TEE);  Surgeon: Pixie Casino, MD;  Location: [facility] ENDOSCOPY;  Service: Cardiovascular;  Laterality: N/A;    Current Medications: Current Meds  Medication Sig  . amiodarone (PACERONE) 200 MG tablet  Take 1 tablet (200 mg total) by mouth 2 (two) times daily for 12 days.  Marland Kitchen amiodarone (PACERONE) 200 MG tablet Take 1 tablet (200 mg total) by mouth daily.  Marland Kitchen amLODipine (NORVASC) 2.5 MG tablet Take 2.5 mg by mouth daily.  . B-D UF III MINI PEN NEEDLES 31G X 5 MM MISC USE WITH LANTUS PEN DAILY AS DIRECTED  . Blood Pressure KIT Monitor blood pressure twice a day  . ELIQUIS 5 MG TABS tablet Take 5 mg by mouth 2 (two) times daily.  . fluticasone (FLONASE) 50 MCG/ACT nasal spray Place 2 sprays into both nostrils daily.  . furosemide (LASIX) 40 MG tablet TAKE 1 TABLET(40 MG) BY MOUTH TWICE DAILY  . glucose blood (ACCU-CHEK AVIVA) test strip Use as instructed  . insulin glargine (LANTUS) 100 UNIT/ML Solostar Pen Inject 18 Units into the skin daily.  . Lancets (ACCU-CHEK SOFT TOUCH) lancets Once daily testing.  . Magnesium Oxide 400 MG CAPS Take 1 capsule (400 mg total) by mouth daily.  . metolazone (ZAROXOLYN) 5 MG tablet Take 30 minutes before Lasix for 7 days  . metoprolol succinate (TOPROL-XL) 100 MG 24 hr tablet Take 1 tablet (100 mg total) by mouth 2 (two) times daily. ** DO NOT CRUSH **  (BETA BLOCKER)  . Semaglutide,0.25 or 0.5MG/DOS, (OZEMPIC, 0.25 OR 0.5 MG/DOSE,) 2 MG/1.5ML SOPN Inject 0.5 mg into the skin once a week. (Patient taking differently:  Inject 0.5 mg into the skin every Monday.)  . [DISCONTINUED] metolazone (ZAROXOLYN) 5 MG tablet Take 1 tablet (5 mg total) by mouth daily.     Allergies:   Fruit & vegetable daily [nutritional supplements], Other, Peanut-containing drug products, Aspirin, and Tylenol [acetaminophen]   Social History   Socioeconomic History  . Marital status: Single    Spouse name: Not on file  . Number of children: 3  . Years of education: Not on file  . Highest education level: High school graduate  Occupational History  . Occupation: Retired    Fish farm manager: OTHER    Comment: CNA  Tobacco Use  . Smoking status: Never Smoker  . Smokeless tobacco: Never  Used  Vaping Use  . Vaping Use: Never used  Substance and Sexual Activity  . Alcohol use: No    Alcohol/week: 0.0 standard drinks  . Drug use: No  . Sexual activity: Not Currently  Other Topics Concern  . Not on file  Social History Narrative   Patient lives alone in an apartment complex for seniors.   Patient does not drive. She walks to surrounding places near her complex or her children run errands for her. Patient never goes without.   Patient enjoys sewing, crafts, shopping, and walking.    Patient has 3 children and she is close with all of them.        Social Determinants of Health   Financial Resource Strain: Not on file  Food Insecurity: Not on file  Transportation Needs: Not on file  Physical Activity: Not on file  Stress: Not on file  Social Connections: Not on file     Family History: The patient's family history includes Alcohol abuse in an other family member; Diabetes in her sister, sister and another family member; Hypertension in her sister; Multiple sclerosis in her sister; Other in her father; Stroke in her mother. There is no history of Breast cancer.  ROS:   Review of Systems  Constitution: Negative for decreased appetite, fever and weight gain.  HENT: Negative for congestion, ear discharge, hoarse voice and sore throat.   Eyes: Negative for discharge, redness, vision loss in right eye and visual halos.  Cardiovascular: Negative for chest pain, dyspnea on exertion, leg swelling, orthopnea and palpitations.  Respiratory: Negative for cough, hemoptysis, shortness of breath and snoring.   Endocrine: Negative for heat intolerance and polyphagia.  Hematologic/Lymphatic: Negative for bleeding problem. Does not bruise/bleed easily.  Skin: Negative for flushing, nail changes, rash and suspicious lesions.  Musculoskeletal: Negative for arthritis, joint pain, muscle cramps, myalgias, neck pain and stiffness.  Gastrointestinal: Negative for abdominal pain, bowel  incontinence, diarrhea and excessive appetite.  Genitourinary: Negative for decreased libido, genital sores and incomplete emptying.  Neurological: Negative for brief paralysis, focal weakness, headaches and loss of balance.  Psychiatric/Behavioral: Negative for altered mental status, depression and suicidal ideas.  Allergic/Immunologic: Negative for HIV exposure and persistent infections.    EKGs/Labs/Other Studies Reviewed:    The following studies were reviewed today:   EKG: None today  TTE IMPRESSIONS  1. Since the last study on 07/27/2019 LVEF has decreased, now severely impaired at 25-30% and diffuse hypokinesis. Severe concentric LVH. RVEF is at least moderately decreased. Evaluation for infiltrative cardiomyopathies such as cardiac amyloidosis should be considered.  2. Left ventricular ejection fraction, by estimation, is 25 to 30%. The left ventricle has severely decreased function. The left ventricle has no regional wall motion abnormalities. There is severe concentric left ventricular hypertrophy. Left  ventricular diastolic function could not be evaluated.  3. Right ventricular systolic function is moderately reduced. The right ventricular size is moderately enlarged. There is moderately elevated pulmonary artery systolic pressure. The estimated right ventricular systolic pressure is 38.1 mmHg.  4. Left atrial size was mildly dilated.  5. The mitral valve is normal in structure. Mild mitral valve regurgitation. No evidence of mitral stenosis.  6. Tricuspid valve regurgitation is moderate to severe.  7. The aortic valve is normal in structure. Aortic valve regurgitation is not visualized. Mild to moderate aortic valve sclerosis/calcification is  present, without any evidence of aortic stenosis.  8. The inferior vena cava is normal in size with greater than 50% respiratory variability, suggesting right atrial pressure of 3 mmHg.    Recent Labs: 02/04/2020: Magnesium 1.5;  TSH 2.436 02/07/2020: ALT 44 03/03/2020: BNP 1,981.9; Hemoglobin 14.0; Platelets 281 03/05/2020: BUN 17; Creatinine, Ser 1.08; Potassium 4.8; Sodium 139  Recent Lipid Panel    Component Value Date/Time   CHOL 156 07/28/2019 0349   CHOL 132 06/08/2017 1110   TRIG 116 07/28/2019 0349   HDL 32 (L) 07/28/2019 0349   HDL 35 (L) 06/08/2017 1110   CHOLHDL 4.9 07/28/2019 0349   VLDL 23 07/28/2019 0349   LDLCALC 101 (H) 07/28/2019 0349   LDLCALC 67 06/08/2017 1110    Physical Exam:    VS:  BP (!) 130/100   Pulse 90   Ht _0  (1.6 m)   Wt 200 lb (90.7 kg)   SpO2 96%   BMI 35.43 kg/m     Wt Readings from Last 3 Encounters:  03/18/20 200 lb (90.7 kg)  03/05/20 206 lb 12.8 oz (93.8 kg)  03/03/20 209 lb 6.4 oz (95 kg)     GEN: Well nourished, well developed in no acute distress HEENT: Normal NECK: No JVD; No carotid bruits LYMPHATICS: No lymphadenopathy CARDIAC: S1S2 noted,RRR, no murmurs, rubs, gallops RESPIRATORY:  Clear to auscultation without rales, wheezing or rhonchi  ABDOMEN: Soft, non-tender, non-distended, +bowel sounds, no guarding. EXTREMITIES: No edema, No cyanosis, no clubbing MUSCULOSKELETAL:  No deformity  SKIN: Warm and dry NEUROLOGIC:  Alert and oriented x 3, non-focal PSYCHIATRIC:  Normal affect, good insight  ASSESSMENT:    1. Bilateral leg edema   2. HFrEF (heart failure with reduced ejection fraction) ([place])   3. Nonrheumatic tricuspid valve regurgitation   4. PAF (paroxysmal atrial fibrillation) ([place])   5. Uncontrolled type 2 diabetes mellitus with hyperglycemia ([place])    PLAN:    She is on Lasix 40 mg twice a day we will like to do is add metolazone 5 mg 30 minutes before her first Lasix dose for 7 days.  I am hoping this can help.  Diurese effectively.  If this does not work we will transition her from furosemide to torsemide.  In addition we discussed her echocardiogram results which was done in January 2022 showed EF 25 to 30%.  I explained to the  patient that we will have to do a couple things which would include a left heart catheterization to rule out coronary artery disease.  In addition there is concern for infiltrative disease in this patient like amyloidosis.  We discussed consideration for further testing.  But the patient is not interested in doing any further testing at this time.  She does have uncontrolled diabetes she has been working with her PCP to get this adjusted.  We discussed about potential endocrine referral-we will refer the patient for this.  We  will also send her for podiatry evaluation.  Her blood pressure is elevated I am hoping that once we get through the metolazone and diuresing we can be able to adjust medications.  We will need to start the patient on Entresto 25-24 mg twice daily, she is on Toprol XL which we will going to continue and eventually will also need to place her on Aldactone.  All of these will be done cautiously giving her lightly elevated kidney function and also to avoid hypotension.  Blood work will be done today to get a BMP and mag.  Continue patient on amiodarone 200 mg daily and Eliquis for her stroke prevention.    The patient is in agreement with the above plan. The patient left the office in stable condition.  The patient will follow up in 4 weeks or sooner if needed.   Medication Adjustments/Labs and Tests Ordered: Current medicines are reviewed at length with the patient today.  Concerns regarding medicines are outlined above.  Orders Placed This Encounter  Procedures  . Basic metabolic panel  . Magnesium  . Ambulatory referral to Podiatry  . Ambulatory referral to Endocrinology   Meds ordered this encounter  Medications  . DISCONTD: metolazone (ZAROXOLYN) 5 MG tablet    Sig: Take 1 tablet (5 mg total) by mouth daily.    Dispense:  90 tablet    Refill:  3  . metolazone (ZAROXOLYN) 5 MG tablet    Sig: Take 30 minutes before Lasix for 7 days    Dispense:  7 tablet     Refill:  0    Patient Instructions  Medication Instructions:  Your physician has recommended you make the following change in your medication:  START: Metolazone 5 mg for 7 days 30 minutes before Lasix  *If you need a refill on your cardiac medications before your next appointment, please call your pharmacy*   Lab Work: Your physician recommends that you return for lab work: TODAY:BMET, [place]  If you have labs (blood work) drawn today and your tests are completely normal, you will receive your results only by: Marland Kitchen MyChart Message (if you have MyChart) OR . A paper copy in the mail If you have any lab test that is abnormal or we need to change your treatment, we will call you to review the results.   Testing/Procedures: None   Follow-Up: At [facility], you and your health needs are our priority.  As part of our continuing mission to provide you with exceptional heart care, we have created designated Provider Care Teams.  These Care Teams include your primary Cardiologist (physician) and Advanced Practice Providers (APPs -  Physician Assistants and Nurse Practitioners) who all work together to provide you with the care you need, when you need it.  We recommend signing up for the patient portal called "MyChart".  Sign up information is provided on this After Visit Summary.  MyChart is used to connect with patients for Virtual Visits (Telemedicine).  Patients are able to view lab/test results, encounter notes, upcoming appointments, etc.  Non-urgent messages can be sent to your provider as well.   To learn more about what you can do with MyChart, go to [email or web address].    Your next appointment:   4 week(s)  The format for your next appointment:   In Person  Provider:   Berniece Salines, DO   Other Instructions      Adopting a Healthy Lifestyle.  Know what a healthy weight is for you (roughly BMI <  25) and aim to maintain this   Aim for 7+ servings of fruits and  vegetables daily   65-80+ fluid ounces of water or unsweet tea for healthy kidneys   Limit to max 1 drink of alcohol per day; avoid smoking/tobacco   Limit animal fats in diet for cholesterol and heart health - choose grass fed whenever available   Avoid highly processed foods, and foods high in saturated/trans fats   Aim for low stress - take time to unwind and care for your mental health   Aim for 150 min of moderate intensity exercise weekly for heart health, and weights twice weekly for bone health   Aim for 7-9 hours of sleep daily   When it comes to diets, agreement about the perfect plan isnt easy to find, even among the experts. Experts at the [employer] developed an idea known as the Healthy Eating Plate. Just imagine a plate divided into logical, healthy portions.   The emphasis is on diet quality:   Load up on vegetables and fruits - one-half of your plate: Aim for color and variety, and remember that potatoes dont count.   Go for whole grains - one-quarter of your plate: Whole wheat, barley, wheat berries, quinoa, oats, brown rice, and foods made with them. If you want pasta, go with whole wheat pasta.   Protein power - one-quarter of your plate: Fish, chicken, beans, and nuts are all healthy, versatile protein sources. Limit red meat.   The diet, however, does go beyond the plate, offering a few other suggestions.   Use healthy plant oils, such as olive, canola, soy, corn, sunflower and peanut. Check the labels, and avoid partially hydrogenated oil, which have unhealthy trans fats.   If youre thirsty, drink water. Coffee and tea are good in moderation, but skip sugary drinks and limit milk and dairy products to one or two daily servings.   The type of carbohydrate in the diet is more important than the amount. Some sources of carbohydrates, such as vegetables, fruits, whole grains, and beans-are healthier than others.   Finally, stay  active  Signed, Berniece Salines, DO  03/18/2020 4:58 PM    [facility]

## 2020-03-19 ENCOUNTER — Telehealth: Payer: Self-pay

## 2020-03-19 LAB — BASIC METABOLIC PANEL
BUN/Creatinine Ratio: 16 (ref 12–28)
BUN: 16 mg/dL (ref 8–27)
CO2: 25 mmol/L (ref 20–29)
Calcium: 9 mg/dL (ref 8.7–10.3)
Chloride: 100 mmol/L (ref 96–106)
Creatinine, Ser: 1.02 mg/dL — ABNORMAL HIGH (ref 0.57–1.00)
Glucose: 239 mg/dL — ABNORMAL HIGH (ref 65–99)
Potassium: 4.7 mmol/L (ref 3.5–5.2)
Sodium: 142 mmol/L (ref 134–144)
eGFR: 60 mL/min/{1.73_m2} (ref >59)

## 2020-03-19 LAB — MAGNESIUM: Magnesium: 1.7 mg/dL (ref 1.6–2.3)

## 2020-03-19 NOTE — Telephone Encounter (Signed)
Spoke with patient regarding results and recommendation.  Patient verbalizes understanding and is agreeable to plan of care. Advised patient to call back with any issues or concerns.  

## 2020-03-19 NOTE — Telephone Encounter (Signed)
-----   Message from Thomasene Ripple, DO sent at 03/19/2020 11:26 AM EST ----- Labs stable, creatinine stable compared to 2 weeks ago.  And your magnesium has improved.  Blood sugar is elevated though

## 2020-03-23 NOTE — Telephone Encounter (Signed)
Hi Deborah, I have signed the forms.  Would you mind reviewing them please?  I will leave in your box and please let me know if there is anything else I need to do.  Thank you  Kenney Houseman

## 2020-03-25 ENCOUNTER — Other Ambulatory Visit: Payer: Self-pay

## 2020-03-25 ENCOUNTER — Encounter: Payer: Self-pay | Admitting: Podiatry

## 2020-03-25 ENCOUNTER — Ambulatory Visit (INDEPENDENT_AMBULATORY_CARE_PROVIDER_SITE_OTHER): Payer: Medicare Other | Admitting: Podiatry

## 2020-03-25 ENCOUNTER — Ambulatory Visit (INDEPENDENT_AMBULATORY_CARE_PROVIDER_SITE_OTHER): Payer: Medicare Other

## 2020-03-25 ENCOUNTER — Ambulatory Visit: Payer: Self-pay | Admitting: Licensed Clinical Social Worker

## 2020-03-25 DIAGNOSIS — M79672 Pain in left foot: Secondary | ICD-10-CM

## 2020-03-25 DIAGNOSIS — M79675 Pain in left toe(s): Secondary | ICD-10-CM | POA: Diagnosis not present

## 2020-03-25 DIAGNOSIS — I5020 Unspecified systolic (congestive) heart failure: Secondary | ICD-10-CM | POA: Diagnosis not present

## 2020-03-25 DIAGNOSIS — B351 Tinea unguium: Secondary | ICD-10-CM

## 2020-03-25 DIAGNOSIS — R600 Localized edema: Secondary | ICD-10-CM | POA: Diagnosis not present

## 2020-03-25 DIAGNOSIS — M79671 Pain in right foot: Secondary | ICD-10-CM

## 2020-03-25 DIAGNOSIS — M79674 Pain in right toe(s): Secondary | ICD-10-CM | POA: Diagnosis not present

## 2020-03-25 DIAGNOSIS — Z7901 Long term (current) use of anticoagulants: Secondary | ICD-10-CM | POA: Diagnosis not present

## 2020-03-25 DIAGNOSIS — E1165 Type 2 diabetes mellitus with hyperglycemia: Secondary | ICD-10-CM | POA: Diagnosis not present

## 2020-03-25 NOTE — Patient Instructions (Addendum)
Diabetes Mellitus and Foot Care Foot care is an important part of your health, especially when you have diabetes. Diabetes may cause you to have problems because of poor blood flow (circulation) to your feet and legs, which can cause your skin to:  Become thinner and drier.  Break more easily.  Heal more slowly.  Peel and crack. You may also have nerve damage (neuropathy) in your legs and feet, causing decreased feeling in them. This means that you may not notice minor injuries to your feet that could lead to more serious problems. Noticing and addressing any potential problems early is the best way to prevent future foot problems. How to care for your feet Foot hygiene  Wash your feet daily with warm water and mild soap. Do not use hot water. Then, pat your feet and the areas between your toes until they are completely dry. Do not soak your feet as this can dry your skin.  Trim your toenails straight across. Do not dig under them or around the cuticle. File the edges of your nails with an emery board or nail file.  Apply a moisturizing lotion or petroleum jelly to the skin on your feet and to dry, brittle toenails. Use lotion that does not contain alcohol and is unscented. Do not apply lotion between your toes.   Shoes and socks  Wear clean socks or stockings every day. Make sure they are not too tight. Do not wear knee-high stockings since they may decrease blood flow to your legs.  Wear shoes that fit properly and have enough cushioning. Always look in your shoes before you put them on to be sure there are no objects inside.  To break in new shoes, wear them for just a few hours a day. This prevents injuries on your feet. Wounds, scrapes, corns, and calluses  Check your feet daily for blisters, cuts, bruises, sores, and redness. If you cannot see the bottom of your feet, use a mirror or ask someone for help.  Do not cut corns or calluses or try to remove them with medicine.  If you  find a minor scrape, cut, or break in the skin on your feet, keep it and the skin around it clean and dry. You may clean these areas with mild soap and water. Do not clean the area with peroxide, alcohol, or iodine.  If you have a wound, scrape, corn, or callus on your foot, look at it several times a day to make sure it is healing and not infected. Check for: ? Redness, swelling, or pain. ? Fluid or blood. ? Warmth. ? Pus or a bad smell.   General tips  Do not cross your legs. This may decrease blood flow to your feet.  Do not use heating pads or hot water bottles on your feet. They may burn your skin. If you have lost feeling in your feet or legs, you may not know this is happening until it is too late.  Protect your feet from hot and cold by wearing shoes, such as at the beach or on hot pavement.  Schedule a complete foot exam at least once a year (annually) or more often if you have foot problems. Report any cuts, sores, or bruises to your health care provider immediately. Where to find more information  American Diabetes Association: www.diabetes.org  Association of Diabetes Care & Education Specialists: www.diabeteseducator.org Contact a health care provider if:  You have a medical condition that increases your risk of infection and   you have any cuts, sores, or bruises on your feet.  You have an injury that is not healing.  You have redness on your legs or feet.  You feel burning or tingling in your legs or feet.  You have pain or cramps in your legs and feet.  Your legs or feet are numb.  Your feet always feel cold.  You have pain around any toenails. Get help right away if:  You have a wound, scrape, corn, or callus on your foot and: ? You have pain, swelling, or redness that gets worse. ? You have fluid or blood coming from the wound, scrape, corn, or callus. ? Your wound, scrape, corn, or callus feels warm to the touch. ? You have pus or a bad smell coming from  the wound, scrape, corn, or callus. ? You have a fever. ? You have a red line going up your leg. Summary  Check your feet every day for blisters, cuts, bruises, sores, and redness.  Apply a moisturizing lotion or petroleum jelly to the skin on your feet and to dry, brittle toenails.  Wear shoes that fit properly and have enough cushioning.  If you have foot problems, report any cuts, sores, or bruises to your health care provider immediately.  Schedule a complete foot exam at least once a year (annually) or more often if you have foot problems. This information is not intended to replace advice given to you by your health care provider. Make sure you discuss any questions you have with your health care provider. Document Revised: 07/19/2019 Document Reviewed: 07/19/2019 Elsevier Patient Education  2021 Elsevier Inc.  

## 2020-03-25 NOTE — Chronic Care Management (AMB) (Signed)
  Care Management  Consultation Note  03/25/2020 Name: [name] [name] MRN: [id number] DOB: [birth year]  [name] [name] is a 70 y.o. year old female who is a primary care patient of Dana Allan, MD. The CCM team was consulted for Consultation reference  care coordination needs for personal care service form.  Intervention: Patient was not interviewed or contacted during this encounter.   CCM LCSW collaborated with  PCP .  Conducted brief assessment, recommendations and relevant information discussed.  Follow up Plan: If further intervention is needed the care management team is available to follow up after a formal CCM referral is placed     Collaboration with Dana Allan, MD regarding development and update of comprehensive plan of care as evidenced by provider attestation and co-signature Review of patient past medical history, allergies, medications, and health status, including review of pertinent consultant reports was performed as part of comprehensive evaluation and provision of care management/care coordination services.   Care Plan Conditions to be addressed/monitored per PCP order: , Level of care concerns  There are no care plans to display for this patient.    Soundra Pilon, LCSW

## 2020-03-25 NOTE — Progress Notes (Signed)
  Subjective:  Patient ID: [name] [name], female    DOB: 09-07-1950,  MRN: [id number]  Chief Complaint  Patient presents with  . Foot Pain    Bilateral foot pain and swelling for about 2-3 weeks  Nail discoloration    70 y.o. female presents with the above complaint. History confirmed with patient.  Here with her daughter.  The feet are not that painful but have been swelling.  She thinks this may be due to medication or heart problems.  Objective:  Physical Exam: warm, good capillary refill, no trophic changes or ulcerative lesions, normal DP and PT pulses, normal monofilament exam and normal sensory exam.  She has thickened elongated and discolored toenails x10.  Mild +1 pitting edema throughout the bilateral lower extremities Radiographs: X-ray of both feet: no fracture, dislocation, swelling or degenerative changes noted Assessment:   1. Pain in both feet   2. Uncontrolled type 2 diabetes mellitus with hyperglycemia (HCC)   3. Chronic anticoagulation - on Eliquis for chronic afib   4. HFrEF (heart failure with reduced ejection fraction) (HCC)   5. Bilateral leg edema   6. Pain due to onychomycosis of toenails of both feet      Plan:  Patient was evaluated and treated and all questions answered.  Patient educated on diabetes. Discussed proper diabetic foot care and discussed risks and complications of disease. Educated patient in depth on reasons to return to the office immediately should he/she discover anything concerning or new on the feet. All questions answered. Discussed proper shoes as well.   Discussed the etiology and treatment options for the condition in detail with the patient. Educated patient on the topical and oral treatment options for mycotic nails. Recommended debridement of the nails today. Sharp and mechanical debridement performed of all painful and mycotic nails today. Nails debrided in length and thickness using a nail nipper to level of comfort.  Discussed treatment options including appropriate shoe gear. Follow up as needed for painful nails.   Discussed etiology and treatment options of bilateral lower extremity edema with patient and her daughter.  I recommended compression stockings over-the-counter.  Think this is likely due to her cardiac history as well as recent medications   Return in about 3 months (around 06/25/2020) for at risk diabetic foot care.

## 2020-04-10 ENCOUNTER — Other Ambulatory Visit: Payer: Self-pay | Admitting: Physician Assistant

## 2020-04-10 NOTE — Telephone Encounter (Signed)
88f,90.7kg, scr 1.02(03/18/20), lovw/tobb 03/18/20

## 2020-04-23 ENCOUNTER — Ambulatory Visit: Payer: Medicare Other | Admitting: Cardiology

## 2020-05-07 ENCOUNTER — Telehealth: Payer: Self-pay

## 2020-05-07 DIAGNOSIS — Z9114 Patient's other noncompliance with medication regimen: Secondary | ICD-10-CM

## 2020-05-07 NOTE — Progress Notes (Signed)
[facility])                                            [facility] Quality Pharmacy Team                                        Statin Quality Measure Assessment    05/07/2020  [name] [name] 18-Dec-1950 [id number]   Next Appointment: none w/PCP or pharmacist  I am a [facility] clinical pharmacist that reviews patients for statin quality initiatives.     Per review of chart and payor information, patient has a diagnosis of diabetes but is not currently filling a statin prescription.  This places patient into the SUPD (Statin Use In Patients with Diabetes) measure for CMS.    Per July 2021 encounter , it was noted during that visit patient was not taking atorvastatin 40 mg (unclear reason) and was instructed to resume taking medication. Per chart review, patient had a 30 -DS of atorvastatin written on 07/2019 without refills - there is no other record of additional prescriptions for this medication. I called to talk w/ the patient regarding compliance, possible medication surplus,or prescription filled via cash. However, she declined patient identifiers, I explained a general reason for the call, and therefore was unable to proceed with call. When I told her I could not continue the call, she verbalized understanding and stated she was not taking any medication (without regard to knowing my specific question).   The 10-year ASCVD risk score Denman George DC [name]., et al., 2013) is: 20.4%   Values used to calculate the score:     Age: 70 years     Sex: Female     Is Non-Hispanic African American: No     Diabetic: Yes     Tobacco smoker: No     Systolic Blood Pressure: 130 mmHg     Is BP treated: Yes     HDL Cholesterol: 32 mg/dL     Total Cholesterol: 156 mg/dL      Component Value Date/Time   CHOL 156 07/28/2019 0349   CHOL 132 06/08/2017 1110   TRIG 116 07/28/2019 0349   HDL 32 (L) 07/28/2019 0349   HDL 35 (L) 06/08/2017 1110   CHOLHDL 4.9  07/28/2019 0349   VLDL 23 07/28/2019 0349   LDLCALC 101 (H) 07/28/2019 0349   LDLCALC 67 06/08/2017 1110    Please consider clinic staff reach out regarding medication compliance as a familiar healthcare personnel may have better outcomes. If patient has stopped taking atorvastatin d/t intolerance please consider reduced administration frequency or include a CPT code for myopathy (G72.0) if applicable at the next office visit.  Please let us know your decision.    Thank you!    Cephus Shelling, PharmD Clinical Pharmacist [facility] Cell: [phone]

## 2020-05-12 ENCOUNTER — Other Ambulatory Visit: Payer: Self-pay

## 2020-05-12 ENCOUNTER — Encounter (HOSPITAL_COMMUNITY)
Admission: EM | Disposition: A | Discharge status: Skilled Nursing Facility | Payer: Self-pay | Source: Home / Self Care | Attending: Pulmonary Disease

## 2020-05-12 ENCOUNTER — Inpatient Hospital Stay (HOSPITAL_COMMUNITY): Payer: Medicare Other

## 2020-05-12 ENCOUNTER — Emergency Department (HOSPITAL_COMMUNITY): Payer: Medicare Other | Admitting: Anesthesiology

## 2020-05-12 ENCOUNTER — Emergency Department (HOSPITAL_COMMUNITY): Payer: Medicare Other

## 2020-05-12 ENCOUNTER — Inpatient Hospital Stay (HOSPITAL_COMMUNITY)
Admission: EM | Admit: 2020-05-12 | Discharge: 2020-05-26 | DRG: 023 | Disposition: A | Discharge status: Skilled Nursing Facility | Payer: Medicare Other | Attending: Internal Medicine | Admitting: Internal Medicine

## 2020-05-12 ENCOUNTER — Encounter (HOSPITAL_COMMUNITY): Payer: Self-pay

## 2020-05-12 DIAGNOSIS — Z4682 Encounter for fitting and adjustment of non-vascular catheter: Secondary | ICD-10-CM | POA: Diagnosis not present

## 2020-05-12 DIAGNOSIS — Z6833 Body mass index (BMI) 33.0-33.9, adult: Secondary | ICD-10-CM

## 2020-05-12 DIAGNOSIS — R404 Transient alteration of awareness: Secondary | ICD-10-CM | POA: Diagnosis not present

## 2020-05-12 DIAGNOSIS — D638 Anemia in other chronic diseases classified elsewhere: Secondary | ICD-10-CM | POA: Diagnosis not present

## 2020-05-12 DIAGNOSIS — I471 Supraventricular tachycardia: Secondary | ICD-10-CM | POA: Diagnosis present

## 2020-05-12 DIAGNOSIS — J9690 Respiratory failure, unspecified, unspecified whether with hypoxia or hypercapnia: Secondary | ICD-10-CM | POA: Diagnosis not present

## 2020-05-12 DIAGNOSIS — D509 Iron deficiency anemia, unspecified: Secondary | ICD-10-CM | POA: Diagnosis present

## 2020-05-12 DIAGNOSIS — Z95810 Presence of automatic (implantable) cardiac defibrillator: Secondary | ICD-10-CM | POA: Diagnosis not present

## 2020-05-12 DIAGNOSIS — N179 Acute kidney failure, unspecified: Secondary | ICD-10-CM | POA: Diagnosis not present

## 2020-05-12 DIAGNOSIS — I63031 Cerebral infarction due to thrombosis of right carotid artery: Secondary | ICD-10-CM | POA: Diagnosis not present

## 2020-05-12 DIAGNOSIS — I63411 Cerebral infarction due to embolism of right middle cerebral artery: Secondary | ICD-10-CM | POA: Diagnosis not present

## 2020-05-12 DIAGNOSIS — I5022 Chronic systolic (congestive) heart failure: Secondary | ICD-10-CM | POA: Diagnosis not present

## 2020-05-12 DIAGNOSIS — E11649 Type 2 diabetes mellitus with hypoglycemia without coma: Secondary | ICD-10-CM | POA: Diagnosis not present

## 2020-05-12 DIAGNOSIS — E871 Hypo-osmolality and hyponatremia: Secondary | ICD-10-CM | POA: Diagnosis not present

## 2020-05-12 DIAGNOSIS — Z886 Allergy status to analgesic agent status: Secondary | ICD-10-CM

## 2020-05-12 DIAGNOSIS — J90 Pleural effusion, not elsewhere classified: Secondary | ICD-10-CM | POA: Diagnosis not present

## 2020-05-12 DIAGNOSIS — G459 Transient cerebral ischemic attack, unspecified: Secondary | ICD-10-CM | POA: Diagnosis not present

## 2020-05-12 DIAGNOSIS — E785 Hyperlipidemia, unspecified: Secondary | ICD-10-CM | POA: Diagnosis present

## 2020-05-12 DIAGNOSIS — I428 Other cardiomyopathies: Secondary | ICD-10-CM | POA: Diagnosis present

## 2020-05-12 DIAGNOSIS — R4781 Slurred speech: Secondary | ICD-10-CM | POA: Diagnosis not present

## 2020-05-12 DIAGNOSIS — Z8616 Personal history of COVID-19: Secondary | ICD-10-CM | POA: Diagnosis not present

## 2020-05-12 DIAGNOSIS — I361 Nonrheumatic tricuspid (valve) insufficiency: Secondary | ICD-10-CM | POA: Diagnosis not present

## 2020-05-12 DIAGNOSIS — I4892 Unspecified atrial flutter: Secondary | ICD-10-CM | POA: Diagnosis not present

## 2020-05-12 DIAGNOSIS — J9601 Acute respiratory failure with hypoxia: Secondary | ICD-10-CM | POA: Diagnosis not present

## 2020-05-12 DIAGNOSIS — I313 Pericardial effusion (noninflammatory): Secondary | ICD-10-CM | POA: Diagnosis present

## 2020-05-12 DIAGNOSIS — E876 Hypokalemia: Secondary | ICD-10-CM | POA: Diagnosis present

## 2020-05-12 DIAGNOSIS — I5023 Acute on chronic systolic (congestive) heart failure: Secondary | ICD-10-CM | POA: Diagnosis present

## 2020-05-12 DIAGNOSIS — R0902 Hypoxemia: Secondary | ICD-10-CM | POA: Diagnosis not present

## 2020-05-12 DIAGNOSIS — I495 Sick sinus syndrome: Secondary | ICD-10-CM | POA: Diagnosis present

## 2020-05-12 DIAGNOSIS — E8809 Other disorders of plasma-protein metabolism, not elsewhere classified: Secondary | ICD-10-CM | POA: Diagnosis present

## 2020-05-12 DIAGNOSIS — I272 Pulmonary hypertension, unspecified: Secondary | ICD-10-CM | POA: Diagnosis present

## 2020-05-12 DIAGNOSIS — R2689 Other abnormalities of gait and mobility: Secondary | ICD-10-CM | POA: Diagnosis not present

## 2020-05-12 DIAGNOSIS — M6281 Muscle weakness (generalized): Secondary | ICD-10-CM | POA: Diagnosis not present

## 2020-05-12 DIAGNOSIS — Z978 Presence of other specified devices: Secondary | ICD-10-CM | POA: Diagnosis not present

## 2020-05-12 DIAGNOSIS — Z833 Family history of diabetes mellitus: Secondary | ICD-10-CM

## 2020-05-12 DIAGNOSIS — R29818 Other symptoms and signs involving the nervous system: Secondary | ICD-10-CM | POA: Diagnosis not present

## 2020-05-12 DIAGNOSIS — E041 Nontoxic single thyroid nodule: Secondary | ICD-10-CM

## 2020-05-12 DIAGNOSIS — D72829 Elevated white blood cell count, unspecified: Secondary | ICD-10-CM | POA: Diagnosis not present

## 2020-05-12 DIAGNOSIS — R531 Weakness: Secondary | ICD-10-CM | POA: Diagnosis not present

## 2020-05-12 DIAGNOSIS — E875 Hyperkalemia: Secondary | ICD-10-CM | POA: Diagnosis not present

## 2020-05-12 DIAGNOSIS — Z8249 Family history of ischemic heart disease and other diseases of the circulatory system: Secondary | ICD-10-CM

## 2020-05-12 DIAGNOSIS — R29810 Facial weakness: Secondary | ICD-10-CM | POA: Diagnosis present

## 2020-05-12 DIAGNOSIS — Z820 Family history of epilepsy and other diseases of the nervous system: Secondary | ICD-10-CM

## 2020-05-12 DIAGNOSIS — I110 Hypertensive heart disease with heart failure: Secondary | ICD-10-CM | POA: Diagnosis present

## 2020-05-12 DIAGNOSIS — I4891 Unspecified atrial fibrillation: Secondary | ICD-10-CM | POA: Diagnosis not present

## 2020-05-12 DIAGNOSIS — Z9889 Other specified postprocedural states: Secondary | ICD-10-CM | POA: Diagnosis not present

## 2020-05-12 DIAGNOSIS — E669 Obesity, unspecified: Secondary | ICD-10-CM | POA: Diagnosis present

## 2020-05-12 DIAGNOSIS — I469 Cardiac arrest, cause unspecified: Secondary | ICD-10-CM | POA: Diagnosis not present

## 2020-05-12 DIAGNOSIS — R4189 Other symptoms and signs involving cognitive functions and awareness: Secondary | ICD-10-CM | POA: Diagnosis present

## 2020-05-12 DIAGNOSIS — G8194 Hemiplegia, unspecified affecting left nondominant side: Secondary | ICD-10-CM | POA: Diagnosis not present

## 2020-05-12 DIAGNOSIS — R414 Neurologic neglect syndrome: Secondary | ICD-10-CM | POA: Diagnosis not present

## 2020-05-12 DIAGNOSIS — E1165 Type 2 diabetes mellitus with hyperglycemia: Secondary | ICD-10-CM | POA: Diagnosis not present

## 2020-05-12 DIAGNOSIS — R498 Other voice and resonance disorders: Secondary | ICD-10-CM | POA: Diagnosis not present

## 2020-05-12 DIAGNOSIS — R6889 Other general symptoms and signs: Secondary | ICD-10-CM | POA: Diagnosis not present

## 2020-05-12 DIAGNOSIS — R29706 NIHSS score 6: Secondary | ICD-10-CM | POA: Diagnosis present

## 2020-05-12 DIAGNOSIS — I5043 Acute on chronic combined systolic (congestive) and diastolic (congestive) heart failure: Secondary | ICD-10-CM

## 2020-05-12 DIAGNOSIS — R278 Other lack of coordination: Secondary | ICD-10-CM | POA: Diagnosis not present

## 2020-05-12 DIAGNOSIS — I6521 Occlusion and stenosis of right carotid artery: Secondary | ICD-10-CM | POA: Diagnosis not present

## 2020-05-12 DIAGNOSIS — I5041 Acute combined systolic (congestive) and diastolic (congestive) heart failure: Secondary | ICD-10-CM | POA: Diagnosis not present

## 2020-05-12 DIAGNOSIS — R34 Anuria and oliguria: Secondary | ICD-10-CM | POA: Diagnosis not present

## 2020-05-12 DIAGNOSIS — Z86711 Personal history of pulmonary embolism: Secondary | ICD-10-CM

## 2020-05-12 DIAGNOSIS — I6300 Cerebral infarction due to thrombosis of unspecified precerebral artery: Secondary | ICD-10-CM | POA: Diagnosis not present

## 2020-05-12 DIAGNOSIS — T45516A Underdosing of anticoagulants, initial encounter: Secondary | ICD-10-CM | POA: Diagnosis present

## 2020-05-12 DIAGNOSIS — I6389 Other cerebral infarction: Secondary | ICD-10-CM | POA: Diagnosis not present

## 2020-05-12 DIAGNOSIS — Z79899 Other long term (current) drug therapy: Secondary | ICD-10-CM

## 2020-05-12 DIAGNOSIS — I5021 Acute systolic (congestive) heart failure: Secondary | ICD-10-CM | POA: Diagnosis not present

## 2020-05-12 DIAGNOSIS — R570 Cardiogenic shock: Secondary | ICD-10-CM | POA: Diagnosis not present

## 2020-05-12 DIAGNOSIS — I63231 Cerebral infarction due to unspecified occlusion or stenosis of right carotid arteries: Secondary | ICD-10-CM | POA: Diagnosis not present

## 2020-05-12 DIAGNOSIS — J9811 Atelectasis: Secondary | ICD-10-CM | POA: Diagnosis not present

## 2020-05-12 DIAGNOSIS — I420 Dilated cardiomyopathy: Secondary | ICD-10-CM | POA: Diagnosis not present

## 2020-05-12 DIAGNOSIS — Z823 Family history of stroke: Secondary | ICD-10-CM

## 2020-05-12 DIAGNOSIS — Z7189 Other specified counseling: Secondary | ICD-10-CM | POA: Diagnosis not present

## 2020-05-12 DIAGNOSIS — I639 Cerebral infarction, unspecified: Secondary | ICD-10-CM | POA: Diagnosis not present

## 2020-05-12 DIAGNOSIS — I4819 Other persistent atrial fibrillation: Secondary | ICD-10-CM | POA: Diagnosis not present

## 2020-05-12 DIAGNOSIS — I5042 Chronic combined systolic (congestive) and diastolic (congestive) heart failure: Secondary | ICD-10-CM

## 2020-05-12 DIAGNOSIS — N390 Urinary tract infection, site not specified: Secondary | ICD-10-CM | POA: Diagnosis not present

## 2020-05-12 DIAGNOSIS — I499 Cardiac arrhythmia, unspecified: Secondary | ICD-10-CM | POA: Diagnosis not present

## 2020-05-12 DIAGNOSIS — F319 Bipolar disorder, unspecified: Secondary | ICD-10-CM | POA: Diagnosis present

## 2020-05-12 DIAGNOSIS — Z452 Encounter for adjustment and management of vascular access device: Secondary | ICD-10-CM | POA: Diagnosis not present

## 2020-05-12 DIAGNOSIS — E782 Mixed hyperlipidemia: Secondary | ICD-10-CM | POA: Diagnosis not present

## 2020-05-12 DIAGNOSIS — I517 Cardiomegaly: Secondary | ICD-10-CM | POA: Diagnosis not present

## 2020-05-12 DIAGNOSIS — I5082 Biventricular heart failure: Secondary | ICD-10-CM | POA: Diagnosis present

## 2020-05-12 DIAGNOSIS — Z515 Encounter for palliative care: Secondary | ICD-10-CM | POA: Diagnosis not present

## 2020-05-12 DIAGNOSIS — R1312 Dysphagia, oropharyngeal phase: Secondary | ICD-10-CM | POA: Diagnosis not present

## 2020-05-12 DIAGNOSIS — Z743 Need for continuous supervision: Secondary | ICD-10-CM | POA: Diagnosis not present

## 2020-05-12 DIAGNOSIS — R911 Solitary pulmonary nodule: Secondary | ICD-10-CM | POA: Diagnosis not present

## 2020-05-12 DIAGNOSIS — I63131 Cerebral infarction due to embolism of right carotid artery: Principal | ICD-10-CM

## 2020-05-12 DIAGNOSIS — I5020 Unspecified systolic (congestive) heart failure: Secondary | ICD-10-CM | POA: Diagnosis not present

## 2020-05-12 DIAGNOSIS — K5903 Drug induced constipation: Secondary | ICD-10-CM | POA: Diagnosis present

## 2020-05-12 HISTORY — DX: Cerebral infarction, unspecified: I63.9

## 2020-05-12 HISTORY — PX: IR PERCUTANEOUS ART THROMBECTOMY/INFUSION INTRACRANIAL INC DIAG ANGIO: IMG6087

## 2020-05-12 HISTORY — DX: Patient's noncompliance with other medical treatment and regimen: Z91.19

## 2020-05-12 HISTORY — DX: Unspecified atrial flutter: I48.92

## 2020-05-12 HISTORY — PX: IR CT HEAD LTD: IMG2386

## 2020-05-12 HISTORY — DX: Patient's noncompliance with other medical treatment and regimen due to unspecified reason: Z91.199

## 2020-05-12 HISTORY — PX: IR US GUIDE VASC ACCESS RIGHT: IMG2390

## 2020-05-12 HISTORY — PX: RADIOLOGY WITH ANESTHESIA: SHX6223

## 2020-05-12 HISTORY — DX: Paroxysmal atrial fibrillation: I48.0

## 2020-05-12 LAB — ECHOCARDIOGRAM COMPLETE
Area-P 1/2: 6.17 cm2
Calc EF: 6.5 %
S' Lateral: 4.6 cm
Single Plane A2C EF: 6 %
Single Plane A4C EF: 14.4 %
Weight: 3167.57 oz

## 2020-05-12 LAB — DIFFERENTIAL
Abs Immature Granulocytes: 0.02 10*3/uL (ref 0.00–0.07)
Basophils Absolute: 0 10*3/uL (ref 0.0–0.1)
Basophils Relative: 1 %
Eosinophils Absolute: 0.1 10*3/uL (ref 0.0–0.5)
Eosinophils Relative: 1 %
Immature Granulocytes: 0 %
Lymphocytes Relative: 33 %
Lymphs Abs: 2 10*3/uL (ref 0.7–4.0)
Monocytes Absolute: 0.6 10*3/uL (ref 0.1–1.0)
Monocytes Relative: 10 %
Neutro Abs: 3.4 10*3/uL (ref 1.7–7.7)
Neutrophils Relative %: 55 %

## 2020-05-12 LAB — POCT I-STAT 7, (LYTES, BLD GAS, ICA,H+H)
Acid-Base Excess: 1 mmol/L (ref 0.0–2.0)
Bicarbonate: 24.7 mmol/L (ref 20.0–28.0)
Calcium, Ion: 1.5 mmol/L — ABNORMAL HIGH (ref 1.15–1.40)
HCT: 42 % (ref 36.0–46.0)
Hemoglobin: 14.3 g/dL (ref 12.0–15.0)
O2 Saturation: 99 %
Potassium: 4.1 mmol/L (ref 3.5–5.1)
Sodium: 136 mmol/L (ref 135–145)
TCO2: 26 mmol/L (ref 22–32)
pCO2 arterial: 37.4 mmHg (ref 32.0–48.0)
pH, Arterial: 7.428 (ref 7.350–7.450)
pO2, Arterial: 130 mmHg — ABNORMAL HIGH (ref 83.0–108.0)

## 2020-05-12 LAB — I-STAT CHEM 8, ED
BUN: 17 mg/dL (ref 8–23)
Calcium, Ion: 1.02 mmol/L — ABNORMAL LOW (ref 1.15–1.40)
Chloride: 101 mmol/L (ref 98–111)
Creatinine, Ser: 0.9 mg/dL (ref 0.44–1.00)
Glucose, Bld: 253 mg/dL — ABNORMAL HIGH (ref 70–99)
HCT: 45 % (ref 36.0–46.0)
Hemoglobin: 15.3 g/dL — ABNORMAL HIGH (ref 12.0–15.0)
Potassium: 3.4 mmol/L — ABNORMAL LOW (ref 3.5–5.1)
Sodium: 137 mmol/L (ref 135–145)
TCO2: 22 mmol/L (ref 22–32)

## 2020-05-12 LAB — CBC
HCT: 42.8 % (ref 36.0–46.0)
Hemoglobin: 13.5 g/dL (ref 12.0–15.0)
MCH: 25.1 pg — ABNORMAL LOW (ref 26.0–34.0)
MCHC: 31.5 g/dL (ref 30.0–36.0)
MCV: 79.6 fL — ABNORMAL LOW (ref 80.0–100.0)
Platelets: 242 10*3/uL (ref 150–400)
RBC: 5.38 MIL/uL — ABNORMAL HIGH (ref 3.87–5.11)
RDW: 15.5 % (ref 11.5–15.5)
WBC: 6.2 10*3/uL (ref 4.0–10.5)
nRBC: 0 % (ref 0.0–0.2)

## 2020-05-12 LAB — COMPREHENSIVE METABOLIC PANEL
ALT: 26 U/L (ref 0–44)
AST: 47 U/L — ABNORMAL HIGH (ref 15–41)
Albumin: 2.6 g/dL — ABNORMAL LOW (ref 3.5–5.0)
Alkaline Phosphatase: 106 U/L (ref 38–126)
Anion gap: 13 (ref 5–15)
BUN: 15 mg/dL (ref 8–23)
CO2: 22 mmol/L (ref 22–32)
Calcium: 8.8 mg/dL — ABNORMAL LOW (ref 8.9–10.3)
Chloride: 100 mmol/L (ref 98–111)
Creatinine, Ser: 1.09 mg/dL — ABNORMAL HIGH (ref 0.44–1.00)
GFR, Estimated: 55 mL/min — ABNORMAL LOW (ref >60)
Glucose, Bld: 257 mg/dL — ABNORMAL HIGH (ref 70–99)
Potassium: 3.4 mmol/L — ABNORMAL LOW (ref 3.5–5.1)
Sodium: 135 mmol/L (ref 135–145)
Total Bilirubin: 1 mg/dL (ref 0.3–1.2)
Total Protein: 6.4 g/dL — ABNORMAL LOW (ref 6.5–8.1)

## 2020-05-12 LAB — MAGNESIUM: Magnesium: 1.4 mg/dL — ABNORMAL LOW (ref 1.7–2.4)

## 2020-05-12 LAB — COOXEMETRY PANEL
Carboxyhemoglobin: 0.6 % (ref 0.5–1.5)
Methemoglobin: 0.6 % (ref 0.0–1.5)
O2 Saturation: 47.1 %
Total hemoglobin: 13.3 g/dL (ref 12.0–16.0)

## 2020-05-12 LAB — PROTIME-INR
INR: 1.3 — ABNORMAL HIGH (ref 0.8–1.2)
Prothrombin Time: 16.5 seconds — ABNORMAL HIGH (ref 11.4–15.2)

## 2020-05-12 LAB — APTT: aPTT: 26 seconds (ref 24–36)

## 2020-05-12 LAB — RESP PANEL BY RT-PCR (FLU A&B, COVID) ARPGX2
Influenza A by PCR: NEGATIVE
Influenza B by PCR: NEGATIVE
SARS Coronavirus 2 by RT PCR: NEGATIVE

## 2020-05-12 LAB — MRSA PCR SCREENING: MRSA by PCR: NEGATIVE

## 2020-05-12 LAB — LACTIC ACID, PLASMA
Lactic Acid, Venous: 3.1 mmol/L (ref 0.5–1.9)
Lactic Acid, Venous: 2.4 mmol/L (ref 0.5–1.9)

## 2020-05-12 LAB — PHOSPHORUS: Phosphorus: 4.9 mg/dL — ABNORMAL HIGH (ref 2.5–4.6)

## 2020-05-12 LAB — CBG MONITORING, ED: Glucose-Capillary: 242 mg/dL — ABNORMAL HIGH (ref 70–99)

## 2020-05-12 LAB — GLUCOSE, CAPILLARY: Glucose-Capillary: 225 mg/dL — ABNORMAL HIGH (ref 70–99)

## 2020-05-12 LAB — TROPONIN I (HIGH SENSITIVITY): Troponin I (High Sensitivity): 99 ng/L — ABNORMAL HIGH

## 2020-05-12 SURGERY — IR WITH ANESTHESIA
Anesthesia: General

## 2020-05-12 MED ORDER — INSULIN ASPART 100 UNIT/ML IJ SOLN
0.0000 [IU] | INTRAMUSCULAR | Status: DC
  Administered 2020-05-12: 5 [IU] via SUBCUTANEOUS
  Administered 2020-05-13: 2 [IU] via SUBCUTANEOUS
  Administered 2020-05-13: 8 [IU] via SUBCUTANEOUS
  Administered 2020-05-13: 5 [IU] via SUBCUTANEOUS
  Administered 2020-05-13: 3 [IU] via SUBCUTANEOUS
  Administered 2020-05-13 – 2020-05-15 (×9): 2 [IU] via SUBCUTANEOUS
  Administered 2020-05-16 (×3): 3 [IU] via SUBCUTANEOUS
  Administered 2020-05-16 (×2): 2 [IU] via SUBCUTANEOUS
  Administered 2020-05-17 (×3): 3 [IU] via SUBCUTANEOUS
  Administered 2020-05-17 – 2020-05-18 (×3): 2 [IU] via SUBCUTANEOUS
  Administered 2020-05-18: 5 [IU] via SUBCUTANEOUS
  Administered 2020-05-18 – 2020-05-19 (×3): 2 [IU] via SUBCUTANEOUS
  Administered 2020-05-19: 3 [IU] via SUBCUTANEOUS
  Administered 2020-05-20: 2 [IU] via SUBCUTANEOUS
  Administered 2020-05-20: 3 [IU] via SUBCUTANEOUS
  Administered 2020-05-20: 2 [IU] via SUBCUTANEOUS
  Administered 2020-05-20: 8 [IU] via SUBCUTANEOUS
  Administered 2020-05-20 – 2020-05-21 (×4): 3 [IU] via SUBCUTANEOUS
  Administered 2020-05-21: 2 [IU] via SUBCUTANEOUS
  Administered 2020-05-21: 5 [IU] via SUBCUTANEOUS
  Administered 2020-05-22: 2 [IU] via SUBCUTANEOUS

## 2020-05-12 MED ORDER — TICAGRELOR 90 MG PO TABS
ORAL_TABLET | ORAL | Status: AC
  Filled 2020-05-12: qty 2

## 2020-05-12 MED ORDER — FAMOTIDINE IN NACL 20-0.9 MG/50ML-% IV SOLN
20.0000 mg | Freq: Two times a day (BID) | INTRAVENOUS | Status: DC
  Filled 2020-05-12: qty 50

## 2020-05-12 MED ORDER — AMIODARONE HCL IN DEXTROSE 360-4.14 MG/200ML-% IV SOLN
INTRAVENOUS | Status: DC | PRN
  Administered 2020-05-12: 60 mg/h via INTRAVENOUS

## 2020-05-12 MED ORDER — SODIUM CHLORIDE 0.9 % IV SOLN: INTRAVENOUS | Status: DC | PRN

## 2020-05-12 MED ORDER — DEXMEDETOMIDINE HCL IN NACL 400 MCG/100ML IV SOLN: 0.0000 ug/kg/h | INTRAVENOUS | Status: DC

## 2020-05-12 MED ORDER — POTASSIUM CHLORIDE 20 MEQ PO PACK: 20.0000 meq | PACK | Freq: Every day | ORAL | Status: DC

## 2020-05-12 MED ORDER — CLOPIDOGREL BISULFATE 300 MG PO TABS
ORAL_TABLET | ORAL | Status: AC
  Filled 2020-05-12: qty 1

## 2020-05-12 MED ORDER — STROKE: EARLY STAGES OF RECOVERY BOOK
Freq: Once | Status: AC
  Filled 2020-05-12: qty 1

## 2020-05-12 MED ORDER — DOCUSATE SODIUM 50 MG/5ML PO LIQD
100.0000 mg | Freq: Two times a day (BID) | ORAL | Status: DC
  Administered 2020-05-12: 100 mg
  Filled 2020-05-12: qty 10

## 2020-05-12 MED ORDER — POLYETHYLENE GLYCOL 3350 17 G PO PACK: 17.0000 g | PACK | Freq: Every day | ORAL | Status: DC

## 2020-05-12 MED ORDER — EPINEPHRINE PF 1 MG/ML IJ SOLN
INTRAMUSCULAR | Status: DC | PRN
  Administered 2020-05-12: .2 mg via INTRAVENOUS

## 2020-05-12 MED ORDER — PHENYLEPHRINE HCL-NACL 20-0.9 MG/250ML-% IV SOLN
INTRAVENOUS | Status: DC | PRN
  Administered 2020-05-12: 25 ug/min via INTRAVENOUS

## 2020-05-12 MED ORDER — IOHEXOL 350 MG/ML SOLN
75.0000 mL | Freq: Once | INTRAVENOUS | Status: AC | PRN
  Administered 2020-05-12: 75 mL via INTRAVENOUS

## 2020-05-12 MED ORDER — IOHEXOL 240 MG/ML SOLN
150.0000 mL | Freq: Once | INTRAMUSCULAR | Status: AC | PRN
  Administered 2020-05-12: 50 mL via INTRA_ARTERIAL

## 2020-05-12 MED ORDER — AMIODARONE LOAD VIA INFUSION
150.0000 mg | Freq: Once | INTRAVENOUS | Status: DC
  Filled 2020-05-12: qty 83.34

## 2020-05-12 MED ORDER — IOHEXOL 240 MG/ML SOLN
INTRAMUSCULAR | Status: AC
  Filled 2020-05-12: qty 100

## 2020-05-12 MED ORDER — FENTANYL CITRATE (PF) 100 MCG/2ML IJ SOLN
25.0000 ug | INTRAMUSCULAR | Status: DC | PRN
  Administered 2020-05-12: 50 ug via INTRAVENOUS
  Filled 2020-05-12: qty 2

## 2020-05-12 MED ORDER — SODIUM CHLORIDE 0.9% FLUSH
3.0000 mL | Freq: Once | INTRAVENOUS | Status: AC
  Administered 2020-05-12: 3 mL via INTRAVENOUS

## 2020-05-12 MED ORDER — ROCURONIUM BROMIDE 10 MG/ML (PF) SYRINGE
PREFILLED_SYRINGE | INTRAVENOUS | Status: DC | PRN
  Administered 2020-05-12: 60 mg via INTRAVENOUS

## 2020-05-12 MED ORDER — LABETALOL HCL 5 MG/ML IV SOLN
10.0000 mg | Freq: Once | INTRAVENOUS | Status: AC
  Administered 2020-05-12: 10 mg via INTRAVENOUS

## 2020-05-12 MED ORDER — LIDOCAINE 2% (20 MG/ML) 5 ML SYRINGE
INTRAMUSCULAR | Status: DC | PRN
  Administered 2020-05-12: 40 mg via INTRAVENOUS

## 2020-05-12 MED ORDER — VERAPAMIL HCL 2.5 MG/ML IV SOLN
INTRAVENOUS | Status: AC
  Filled 2020-05-12: qty 2

## 2020-05-12 MED ORDER — NOREPINEPHRINE 4 MG/250ML-% IV SOLN
0.0000 ug/min | INTRAVENOUS | Status: DC
  Administered 2020-05-12 – 2020-05-13 (×2): 2 ug/min via INTRAVENOUS
  Filled 2020-05-12 (×2): qty 250

## 2020-05-12 MED ORDER — SODIUM CHLORIDE 0.9 % IV SOLN: INTRAVENOUS | Status: AC

## 2020-05-12 MED ORDER — SODIUM CHLORIDE 0.9 % IV SOLN
50.0000 mL | Freq: Once | INTRAVENOUS | Status: AC
  Administered 2020-05-12: 50 mL via INTRAVENOUS

## 2020-05-12 MED ORDER — CLEVIDIPINE BUTYRATE 0.5 MG/ML IV EMUL: 0.0000 mg/h | INTRAVENOUS | Status: DC

## 2020-05-12 MED ORDER — CANGRELOR TETRASODIUM 50 MG IV SOLR
INTRAVENOUS | Status: AC
  Filled 2020-05-12: qty 50

## 2020-05-12 MED ORDER — PROPOFOL 10 MG/ML IV BOLUS
INTRAVENOUS | Status: DC | PRN
  Administered 2020-05-12: 120 mg via INTRAVENOUS

## 2020-05-12 MED ORDER — AMIODARONE HCL IN DEXTROSE 360-4.14 MG/200ML-% IV SOLN
30.0000 mg/h | INTRAVENOUS | Status: DC
  Administered 2020-05-13 (×4): 30 mg/h via INTRAVENOUS
  Filled 2020-05-12 (×5): qty 200

## 2020-05-12 MED ORDER — PROSOURCE TF PO LIQD
45.0000 mL | Freq: Two times a day (BID) | ORAL | Status: DC
  Administered 2020-05-12: 45 mL
  Filled 2020-05-12: qty 45

## 2020-05-12 MED ORDER — INSULIN GLARGINE 100 UNIT/ML ~~LOC~~ SOLN
18.0000 [IU] | Freq: Every day | SUBCUTANEOUS | Status: DC
  Administered 2020-05-12 – 2020-05-21 (×10): 18 [IU] via SUBCUTANEOUS
  Filled 2020-05-12 (×13): qty 0.18

## 2020-05-12 MED ORDER — MAGNESIUM SULFATE 4 GM/100ML IV SOLN
4.0000 g | Freq: Once | INTRAVENOUS | Status: AC
  Administered 2020-05-12: 4 g via INTRAVENOUS
  Filled 2020-05-12: qty 100

## 2020-05-12 MED ORDER — ATORVASTATIN CALCIUM 80 MG PO TABS
80.0000 mg | ORAL_TABLET | Freq: Every day | ORAL | Status: DC
  Administered 2020-05-12: 80 mg
  Filled 2020-05-12: qty 1

## 2020-05-12 MED ORDER — ALTEPLASE (STROKE) FULL DOSE INFUSION
0.9000 mg/kg | Freq: Once | INTRAVENOUS | Status: AC
  Administered 2020-05-12: 80.8 mg via INTRAVENOUS

## 2020-05-12 MED ORDER — ACETAMINOPHEN 650 MG RE SUPP: 650.0000 mg | RECTAL | Status: DC | PRN

## 2020-05-12 MED ORDER — SENNOSIDES-DOCUSATE SODIUM 8.6-50 MG PO TABS: 1.0000 | ORAL_TABLET | Freq: Every evening | ORAL | Status: DC | PRN

## 2020-05-12 MED ORDER — SUCCINYLCHOLINE CHLORIDE 200 MG/10ML IV SOSY
PREFILLED_SYRINGE | INTRAVENOUS | Status: DC | PRN
  Administered 2020-05-12: 120 mg via INTRAVENOUS

## 2020-05-12 MED ORDER — PANTOPRAZOLE SODIUM 40 MG IV SOLR
40.0000 mg | Freq: Every day | INTRAVENOUS | Status: DC
  Administered 2020-05-12 – 2020-05-14 (×3): 40 mg via INTRAVENOUS
  Filled 2020-05-12 (×3): qty 40

## 2020-05-12 MED ORDER — CHLORHEXIDINE GLUCONATE 0.12% ORAL RINSE (MEDLINE KIT)
15.0000 mL | Freq: Two times a day (BID) | OROMUCOSAL | Status: DC
  Administered 2020-05-12 – 2020-05-13 (×2): 15 mL via OROMUCOSAL

## 2020-05-12 MED ORDER — CLEVIDIPINE BUTYRATE 0.5 MG/ML IV EMUL
INTRAVENOUS | Status: AC
  Filled 2020-05-12: qty 50

## 2020-05-12 MED ORDER — MIDAZOLAM HCL 2 MG/2ML IJ SOLN
INTRAMUSCULAR | Status: AC
  Filled 2020-05-12: qty 2

## 2020-05-12 MED ORDER — ACETAMINOPHEN 325 MG PO TABS: 650.0000 mg | ORAL_TABLET | ORAL | Status: DC | PRN

## 2020-05-12 MED ORDER — AMIODARONE IV BOLUS ONLY 150 MG/100ML
INTRAVENOUS | Status: DC | PRN
  Administered 2020-05-12: 150 mg via INTRAVENOUS

## 2020-05-12 MED ORDER — CALCIUM CHLORIDE 10 % IV SOLN
INTRAVENOUS | Status: DC | PRN
  Administered 2020-05-12: 400 mg via INTRAVENOUS

## 2020-05-12 MED ORDER — MIDAZOLAM HCL 5 MG/5ML IJ SOLN
INTRAMUSCULAR | Status: DC | PRN
  Administered 2020-05-12: 2 mg via INTRAVENOUS

## 2020-05-12 MED ORDER — ALBUTEROL SULFATE HFA 108 (90 BASE) MCG/ACT IN AERS
INHALATION_SPRAY | RESPIRATORY_TRACT | Status: DC | PRN
  Administered 2020-05-12: 5 via RESPIRATORY_TRACT

## 2020-05-12 MED ORDER — EPINEPHRINE PF 1 MG/ML IJ SOLN
INTRAVENOUS | Status: DC | PRN
  Administered 2020-05-12: 2 ug/min via INTRAVENOUS

## 2020-05-12 MED ORDER — VASOPRESSIN 20 UNIT/ML IV SOLN
INTRAVENOUS | Status: DC | PRN
  Administered 2020-05-12: 5 [IU] via INTRAVENOUS
  Administered 2020-05-12 (×3): 2 [IU] via INTRAVENOUS
  Administered 2020-05-12: 3 [IU] via INTRAVENOUS
  Administered 2020-05-12: 2 [IU] via INTRAVENOUS
  Administered 2020-05-12: 5 [IU] via INTRAVENOUS
  Administered 2020-05-12: 1 [IU] via INTRAVENOUS
  Administered 2020-05-12: 3 [IU] via INTRAVENOUS

## 2020-05-12 MED ORDER — IOHEXOL 240 MG/ML SOLN
INTRAMUSCULAR | Status: AC
  Filled 2020-05-12: qty 200

## 2020-05-12 MED ORDER — IOHEXOL 300 MG/ML  SOLN
50.0000 mL | Freq: Once | INTRAMUSCULAR | Status: AC | PRN
  Administered 2020-05-12: 10 mL via INTRA_ARTERIAL

## 2020-05-12 MED ORDER — ASPIRIN 81 MG PO CHEW
CHEWABLE_TABLET | ORAL | Status: AC
  Filled 2020-05-12: qty 1

## 2020-05-12 MED ORDER — TIROFIBAN HCL IN NACL 5-0.9 MG/100ML-% IV SOLN
INTRAVENOUS | Status: AC
  Filled 2020-05-12: qty 100

## 2020-05-12 MED ORDER — ORAL CARE MOUTH RINSE
15.0000 mL | OROMUCOSAL | Status: DC
  Administered 2020-05-12 – 2020-05-13 (×6): 15 mL via OROMUCOSAL

## 2020-05-12 MED ORDER — VITAL HIGH PROTEIN PO LIQD: 1000.0000 mL | ORAL | Status: DC

## 2020-05-12 MED ORDER — ACETAMINOPHEN 160 MG/5ML PO SOLN: 650.0000 mg | ORAL | Status: DC | PRN

## 2020-05-12 MED ORDER — FENTANYL CITRATE (PF) 100 MCG/2ML IJ SOLN: 25.0000 ug | INTRAMUSCULAR | Status: DC | PRN

## 2020-05-12 MED ORDER — FENTANYL CITRATE (PF) 100 MCG/2ML IJ SOLN
12.5000 ug | INTRAMUSCULAR | Status: DC | PRN
  Administered 2020-05-13: 25 ug via INTRAVENOUS
  Filled 2020-05-12: qty 2

## 2020-05-12 MED ORDER — EPTIFIBATIDE 20 MG/10ML IV SOLN
INTRAVENOUS | Status: AC
  Filled 2020-05-12: qty 10

## 2020-05-12 MED ORDER — AMIODARONE HCL IN DEXTROSE 360-4.14 MG/200ML-% IV SOLN
60.0000 mg/h | INTRAVENOUS | Status: AC
  Administered 2020-05-12: 60 mg/h via INTRAVENOUS
  Filled 2020-05-12 (×2): qty 200

## 2020-05-12 MED ORDER — VASOPRESSIN 20 UNITS/100 ML INFUSION FOR SHOCK
0.0000 [IU]/min | INTRAVENOUS | Status: AC
Start: 2020-05-12 — End: 2020-05-12
  Administered 2020-05-12: .03 [IU]/min via INTRAVENOUS
  Filled 2020-05-12: qty 100

## 2020-05-12 NOTE — ED Provider Notes (Signed)
[place] EMERGENCY DEPARTMENT Provider Note   CSN: [id number] Arrival date & time: 05/12/20  1217     History No chief complaint on file.   [name] [name] is a 70 y.o. female.  Patient is a 70 year old female with a history of diabetes, CHF, hypertension, atrial fibrillation on Eliquis but she stopped it 1 week ago who is presenting today with paramedics for garbled speech, left-sided facial droop and left-sided weakness.  It was reported that patient spoke with her family at 10:00 AM this morning and she was her normal self.  She does report that when she woke up this morning she had severe abdominal pain that went into her back that is still present but gotten significantly better.  However family FaceTime to her approximately 61 and noted that the patient had difficulty speaking had a droop in the side of her face and so they called 911.  When paramedics arrived they noted some left-sided weakness.  Patient normally walks without assistance and was having difficulty walking.  She has no prior history of stroke.  She denies any vomiting or diarrhea today.  The history is provided by the patient, the EMS personnel and medical records.       Past Medical History:  Diagnosis Date  . Abdominal pain   . Back pain   . Chronic combined systolic and diastolic CHF (congestive heart failure) ([place]) 02/04/2020  . Constipation   . Diabetes mellitus without complication ([place])   . Fatigue   . Full dentures   . Hemorrhoids   . Hypertension   . Lack of adequate sleep   . Liver mass   . Rash    on right breast  . Wears glasses     Patient Active Problem List   Diagnosis Date Noted  . Bilateral leg edema 03/18/2020  . HFrEF (heart failure with reduced ejection fraction) ([place]) 03/18/2020  . Nonrheumatic tricuspid valve regurgitation 03/18/2020  . Gastroesophageal reflux disease 03/12/2020  . Wears glasses   . Liver mass   . Lack of adequate sleep   . Hypertension    . Full dentures   . Fatigue   . Diabetes mellitus without complication ([place])   . SOB (shortness of breath)   . A-fib ([place]) 02/06/2020  . COVID-19 02/04/2020  . Chronic anticoagulation - on Eliquis for chronic afib 02/04/2020  . Chronic combined systolic and diastolic CHF (congestive heart failure) ([place]) 02/04/2020  . Oral thrush 02/04/2020  . Acute cystitis 02/04/2020  . Acute combined systolic and diastolic heart failure ([place]) 02/03/2020  . Morbid obesity ([place]) 02/03/2020  . Atrial fibrillation with RVR ([place]) 02/03/2020  . Dysphagia 02/03/2020  . Epigastric pain 02/03/2020  . Atrial tachycardia ([place]) 07/29/2019  . History of esophageal stricture 07/29/2019  . Atrial fibrillation with rapid ventricular response ([place]) 07/26/2019  . Rash 12/30/2017  . Healthcare maintenance 09/07/2017  . Osteopenia after menopause 06/09/2017  . Uncontrolled type 2 diabetes mellitus with hyperglycemia ([place]) 06/08/2017  . Back pain 11/15/2014  . Pericardial effusion 05/12/2010  . HTN (hypertension) 05/12/2010  . Mixed hyperlipidemia 05/12/2010    Past Surgical History:  Procedure Laterality Date  . BREAST DUCTAL SYSTEM EXCISION    . BREAST EXCISIONAL BIOPSY Left   . CARDIOVERSION N/A 07/31/2019   Procedure: CARDIOVERSION;  Surgeon: Pixie Casino, MD;  Location: [facility] ENDOSCOPY;  Service: Cardiovascular;  Laterality: N/A;  . CHOLECYSTECTOMY    . CHOLECYSTECTOMY, LAPAROSCOPIC  07/24/2010  . ESOPHAGEAL DILATION    .  TEE WITHOUT CARDIOVERSION N/A 07/31/2019   Procedure: TRANSESOPHAGEAL ECHOCARDIOGRAM (TEE);  Surgeon: Pixie Casino, MD;  Location: [facility] ENDOSCOPY;  Service: Cardiovascular;  Laterality: N/A;     OB History   No obstetric history on file.     Family History  Problem Relation Age of Onset  . Stroke Mother   . Other Father        broken heart after spouse spouse  . Diabetes Sister   . Hypertension Sister   . Diabetes Sister   . Multiple sclerosis Sister   . Alcohol abuse  Other   . Diabetes Other   . Breast cancer Neg Hx     Social History   Tobacco Use  . Smoking status: Never Smoker  . Smokeless tobacco: Never Used  Vaping Use  . Vaping Use: Never used  Substance Use Topics  . Alcohol use: No    Alcohol/week: 0.0 standard drinks  . Drug use: No    Home Medications Prior to Admission medications   Medication Sig Start Date End Date Taking? Authorizing Provider  amiodarone (PACERONE) 200 MG tablet Take 1 tablet (200 mg total) by mouth 2 (two) times daily for 12 days. 02/07/20 02/19/20  Doristine Mango L, DO  amiodarone (PACERONE) 200 MG tablet Take 1 tablet (200 mg total) by mouth daily. 02/21/20   Anderson, Chelsey L, DO  amLODipine (NORVASC) 2.5 MG tablet Take 2.5 mg by mouth daily. 02/09/20   [provider]  B-D UF III MINI PEN NEEDLES 31G X 5 MM MISC USE WITH LANTUS PEN DAILY AS DIRECTED 02/11/20   Carollee Leitz, MD  Blood Pressure KIT Monitor blood pressure twice a day 09/28/19   Carollee Leitz, MD  ELIQUIS 5 MG TABS tablet TAKE 1 TABLET(5 MG) BY MOUTH TWICE DAILY 04/10/20   Tobb, Kardie, DO  fluticasone (FLONASE) 50 MCG/ACT nasal spray Place 2 sprays into both nostrils daily. 02/09/20   [provider]  furosemide (LASIX) 40 MG tablet TAKE 1 TABLET(40 MG) BY MOUTH TWICE DAILY 03/03/20   Carollee Leitz, MD  glucose blood (ACCU-CHEK AVIVA) test strip Use as instructed 06/16/17   Zenia Resides, MD  insulin glargine (LANTUS) 100 UNIT/ML Solostar Pen Inject 18 Units into the skin daily. 02/07/20   Anderson, Chelsey L, DO  Lancets (ACCU-CHEK SOFT TOUCH) lancets Once daily testing. 06/16/17   Zenia Resides, MD  Magnesium Oxide 400 MG CAPS Take 1 capsule (400 mg total) by mouth daily. 08/17/19   Richardson Dopp T, PA-C  metolazone (ZAROXOLYN) 5 MG tablet Take 30 minutes before Lasix for 7 days 03/18/20   Tobb, Kardie, DO  metoprolol succinate (TOPROL-XL) 100 MG 24 hr tablet Take 1 tablet (100 mg total) by mouth 2 (two) times daily. ** DO NOT CRUSH  **  (BETA BLOCKER) 02/07/20 03/08/20  Anderson, Chelsey L, DO  Semaglutide,0.25 or 0.5MG /DOS, (OZEMPIC, 0.25 OR 0.5 MG/DOSE,) 2 MG/1.5ML SOPN Inject 0.5 mg into the skin once a week. Patient taking differently: Inject 0.5 mg into the skin every Monday. 11/14/19   Leavy Cella, RPH-CPP    Allergies    Fruit & vegetable daily [nutritional supplements], Other, Peanut-containing drug products, Aspirin, and Tylenol [acetaminophen]  Review of Systems   Review of Systems  All other systems reviewed and are negative.   Physical Exam Updated Vital Signs Wt 89.8 kg   BMI 35.07 kg/m   Physical Exam Vitals and nursing note reviewed.  Constitutional:      General: She is not in  acute distress.    Appearance: Normal appearance.  HENT:     Head: Normocephalic.     Mouth/Throat:     Mouth: Mucous membranes are moist.  Eyes:     Extraocular Movements: Extraocular movements intact.     Conjunctiva/sclera: Conjunctivae normal.     Pupils: Pupils are equal, round, and reactive to light.  Cardiovascular:     Rate and Rhythm: Tachycardia present. Rhythm regularly irregular.     Pulses: Normal pulses.  Pulmonary:     Effort: Pulmonary effort is normal.  Musculoskeletal:     Cervical back: Normal range of motion and neck supple.  Skin:    General: Skin is warm and dry.  Neurological:     Mental Status: She is alert.     Comments: Mild neglect on the left.  Pronator drift on the left upper/lower ext.  Left sided facial droop.  No notable aphasia  Psychiatric:        Mood and Affect: Mood normal.        Behavior: Behavior normal.     ED Results / Procedures / Treatments   Labs (all labs ordered are listed, but only abnormal results are displayed) Labs Reviewed  I-STAT CHEM 8, ED - Abnormal; Notable for the following components:      Result Value   Potassium 3.4 (*)    Glucose, Bld 253 (*)    Calcium, Ion 1.02 (*)    Hemoglobin 15.3 (*)    All other components within normal limits   CBG MONITORING, ED - Abnormal; Notable for the following components:   Glucose-Capillary 242 (*)    All other components within normal limits  PROTIME-INR  APTT  CBC  DIFFERENTIAL  COMPREHENSIVE METABOLIC PANEL    EKG EKG Interpretation  Date/Time:  Monday May 12 2020 13:00:33 EDT Ventricular Rate:  154 PR Interval:    QRS Duration: 85 QT Interval:  306 QTC Calculation: 490 R Axis:   81 Text Interpretation: recurrent  Atrial fibrillation with rapid V-rate Borderline right axis deviation Repolarization abnormality, prob rate related Confirmed by Blanchie Dessert [phone]) on 05/12/2020 1:14:11 PM   Radiology CT HEAD CODE STROKE WO CONTRAST  Result Date: 05/12/2020 CLINICAL DATA:  Code stroke. Neuro deficit, acute, stroke suspected. Additional history provided: Left-sided weakness. EXAM: CT HEAD WITHOUT CONTRAST TECHNIQUE: Contiguous axial images were obtained from the base of the skull through the vertex without intravenous contrast. COMPARISON:  Prior head CT examinations 05/10/2010. FINDINGS: Brain: Cerebral volume is normal. There is no acute intracranial hemorrhage. No demarcated cortical infarct. No extra-axial fluid collection. No evidence of intracranial mass. No midline shift. Vascular: No hyperdense vessel.  Atherosclerotic calcifications Skull: Normal. Negative for fracture or focal lesion. Sinuses/Orbits: Redemonstrated bilateral proptosis. Rightward gaze. No significant paranasal sinus disease. ASPECTS [facility] Stroke Program Early CT Score) - Ganglionic level infarction (caudate, lentiform nuclei, internal capsule, insula, M1-M3 cortex): 7 - Supraganglionic infarction (M4-M6 cortex): 3 Total score (0-10 with 10 being normal): 10 These results were communicated to Dr. Cheral Marker at 12:40 pmon 5/2/2022by text page via the [facility] messaging system. IMPRESSION: No evidence of acute intracranial abnormality.  ASPECTS is 10. Redemonstrated bilateral proptosis. Electronically Signed   By:  Kellie Simmering DO   On: 05/12/2020 12:41    Procedures Procedures   Medications Ordered in ED Medications  sodium chloride flush (NS) 0.9 % injection 3 mL (has no administration in time range)    ED Course  I have reviewed the triage vital signs and the  nursing notes.  Pertinent labs & imaging results that were available during my care of the patient were reviewed by me and considered in my medical decision making (see chart for details).    MDM Rules/Calculators/A&P                          70 year old female with multiple medical problems including multiple stroke risk factors presenting today as a code stroke.  Neurology present upon patient's arrival.  Her airway was intact and she went directly to CT. patient was noted to have left-sided facial droop, arm and leg drift and some difficulty ambulating.  She was also having garbled speech for her family and EMS upon arrival but seems to be improving now.  Patient does have a history of air atrial fibrillation but reported stopped Eliquis 1 week ago.  She was last seen normal at 10 AM this morning when she was speaking with her family on the phone.  They noticed the deficits about 1130 today when they FaceTime to her and she was not acting normal.  1:14 PM CT was negative however CTA head did showed clot present.  CMP without acute findings, CBC within normal limits, PT/INR is 1.3.  On repeat evaluation patient's mental status is declining.  Now she is more sleepy, she is now gazing towards the right.  She is talking with her daughter on the phone but patient initially refused tPA.  However daughter feels that they should proceed.  At this time patient does not appear to be making competent decisions.  Do not think that she can fully make this decision.  Neurologist is present at bedside and speaking with the daughter she feels that the patient would want a proceed with the procedure.  Patient will go to IR for attempted clot retrieval.  Risks  and benefits were discussed with her daughter.  Patient is currently in atrial fibrillation with RVR.  She was given labetalol and also started on Cleviprex for blood pressure control.  MDM Number of Diagnoses or Management Options   Amount and/or Complexity of Data Reviewed Clinical lab tests: ordered and reviewed Tests in the radiology section of CPT: ordered and reviewed Tests in the medicine section of CPT: ordered and reviewed   CRITICAL CARE Performed by: Aleeha Boline Total critical care time: 30 minutes Critical care time was exclusive of separately billable procedures and treating other patients. Critical care was necessary to treat or prevent imminent or life-threatening deterioration. Critical care was time spent personally by me on the following activities: development of treatment plan with patient and/or surrogate as well as nursing, discussions with consultants, evaluation of patient's response to treatment, examination of patient, obtaining history from patient or surrogate, ordering and performing treatments and interventions, ordering and review of laboratory studies, ordering and review of radiographic studies, pulse oximetry and re-evaluation of patient's condition.   Final Clinical Impression(s) / ED Diagnoses Final diagnoses:  Acute ischemic stroke [facility])    Rx / DC Orders ED Discharge Orders    None       Blanchie Dessert, MD 05/12/20 1318

## 2020-05-12 NOTE — Code Documentation (Addendum)
Stroke Response Nurse Documentation Code Documentation  YOUSRA IVENS is a 70 y.o. female arriving to [facility] ED via [facility] on 05/12/2020 with past medical hx of Afib and CHF. Code stroke was activated by EMS. Patient from home where she was LKW at 1000 when family talked to her on the phone and she was noted to be walking around the house and normal speech. At 1130, family FaceTimed her and noted a facial droop and slurred speech. EMS was called and activated the Code Stroke.   Patient reports she stopped taking her Eliquis one week ago due to the side effects and was not instructed to by her doctor.   Stroke team at the bedside on patient arrival. Labs drawn and patient cleared for CT by Dr. Anitra Lauth. Patient to CT with team. NIHSS 6, see documentation for details and code stroke times. Patient with left facial droop, left arm weakness, bilateral leg weakness and left decreased sensation on exam. The following imaging was completed:  CT, CTA head and neck. Patient is a candidate for tPA, but significant delay noted due to patient refusal. Pt refused tPA in CT Suite. Taken back to the room and initially refused IR. Called daughter and daughter spoke with patient about proceeded with tPA and IR.   Care/Plan: q15 x 2 hours, q30 x 6 hours, q1h x 16 hours and take patient for endovascular treatment. Admission to ICU. Bedside handoff with IR RN Barbara Cower.    Lucila Maine  Stroke Response RN

## 2020-05-12 NOTE — Anesthesia Preprocedure Evaluation (Signed)
Anesthesia Evaluation  Patient identified by MRN, date of birth, ID band Patient confused    Reviewed: Patient's Chart, lab work & pertinent test results, Unable to perform ROS - Chart review only  Airway Mallampati: III  TM Distance: >3 FB     Dental  (+) Edentulous Upper, Edentulous Lower   Pulmonary     + decreased breath sounds      Cardiovascular hypertension,  Rhythm:Irregular Rate:Tachycardia     Neuro/Psych    GI/Hepatic   Endo/Other  diabetes  Renal/GU      Musculoskeletal   Abdominal   Peds  Hematology   Anesthesia Other Findings   Reproductive/Obstetrics                             Anesthesia Physical Anesthesia Plan  ASA: IV and emergent  Anesthesia Plan: General   Post-op Pain Management:    Induction: Intravenous, Cricoid pressure planned and Rapid sequence  PONV Risk Score and Plan:   Airway Management Planned: Oral ETT  Additional Equipment:   Intra-op Plan:   Post-operative Plan:   Informed Consent: I have reviewed the patients History and Physical, chart, labs and discussed the procedure including the risks, benefits and alternatives for the proposed anesthesia with the patient or authorized representative who has indicated his/her understanding and acceptance.       Plan Discussed with: CRNA and Anesthesiologist  Anesthesia Plan Comments:         Anesthesia Quick Evaluation

## 2020-05-12 NOTE — Progress Notes (Signed)
PT Cancellation Note  Patient Details Name: [name] [name] MRN: [id number] DOB: 13-Dec-1950   Cancelled Treatment:    Reason Eval/Treat Not Completed: Medical issues which prohibited therapy - pt s/p code blue, and on strict bedrest. PT to check back.  Marye Round, PT DPT Acute Rehabilitation Services Pager [phone]  Office [phone]    Truddie Coco 05/12/2020, 3:37 PM

## 2020-05-12 NOTE — Anesthesia Postprocedure Evaluation (Signed)
Anesthesia Post Note  Patient: [name] [name]  Procedure(s) Performed: IR WITH ANESTHESIA (N/A )     Patient location during evaluation: ICU Anesthesia Type: General Level of consciousness: sedated and patient remains intubated per anesthesia plan Pain management: pain level controlled Vital Signs Assessment: vitals unstable Respiratory status: patient remains intubated per anesthesia plan and respiratory function unstable Cardiovascular status: unstable Postop Assessment: no apparent nausea or vomiting Anesthetic complications: no Comments: Patient underwent CPR at end of procedure prior to ICU transport. Afib RVR with inadequate MAP despite multiple vasopressor infusions and boluses. See intraop record for details. ROSC obtained. Vasopressors adjusted. Central line placed. Lungs wet bilaterally. Difficult to obtain accurate SpO2 given clamping down from vasopressors, PaO2 on ABG following CPR appeared ok. Transported to ICU intubated and on vasopressors, discussed patient with ICU attending prior to transport.   No complications documented.  Last Vitals:  Vitals:   05/12/20 1300 05/12/20 1309  BP: (!) 163/104   Pulse: (!) 147   Resp: (!) 29   Temp:  36.8 C  SpO2: 98%     Last Pain:  Vitals:   05/12/20 1309  TempSrc: Oral                 Beryle Lathe

## 2020-05-12 NOTE — Sedation Documentation (Signed)
Procedure started. Anesthesia case 

## 2020-05-12 NOTE — Sedation Documentation (Signed)
Pt's BP rapidly decreasing with systolic pressure now in the 60s. Chest compressions started. Code blue called. See anesthesia record for documentation.

## 2020-05-12 NOTE — Progress Notes (Signed)
Pt placed on vent in IR and transported to 4N32 with no complications noted during transport. Report given to [facility] RRT.

## 2020-05-12 NOTE — Progress Notes (Signed)
  Echocardiogram 2D Echocardiogram has been performed.  Janalyn Harder 05/12/2020, 7:11 PM

## 2020-05-12 NOTE — Code Documentation (Signed)
  Patient Name: VERDIA BOLT   MRN: [id number]   Date of Birth/ Sex: Sep 26, 1950 , female      Admission Date: 05/12/2020  Attending Provider: Lupita Leash, MD  Primary Diagnosis: <principal problem not specified>   Indication: Pt was in her usual state of health until this PM, when she was noted to be hypotensive after mechanical carotid thrombectomy and subsequently found in PEA arrest. Code blue was subsequently called. At the time of arrival on scene, ACLS protocol was underway.   Technical Description:  - CPR performance duration:  2 minutes  - Was defibrillation or cardioversion used? No   - Was external pacer placed? No  - Was patient intubated pre/post CPR? Yes   Medications Administered: Y = Yes; Blank = No Amiodarone  y  Atropine    Calcium    Epinephrine  y  Lidocaine    Magnesium    Norepinephrine    Phenylephrine    Sodium bicarbonate    Vasopressin     Post CPR evaluation:  - Final Status - Was patient successfully resuscitated ? Yes - What is current rhythm? Sinus - What is current hemodynamic status?  Stable   Miscellaneous Information:  - Labs sent, including: Lactic acid, troponin, mg, phos  - Primary team notified?  Yes  - Family Notified? Yes  - Additional notes/ transfer status: Anesthesia at bedside during procedure. PCCM notified.      Quincy Simmonds, MD  05/12/2020, 7:52 PM

## 2020-05-12 NOTE — Procedures (Signed)
INTERVENTIONAL NEURORADIOLOGY BRIEF POSTPROCEDURE NOTE  Diagnostic cerebral angiogram and mechanical thrombectomy  Attending: Dr. Baldemar Lenis  Assistant: None.  Diagnosis: Right ICA terminus occlusion.  Access site: RCFA, 58F  Access closure: Perclose ProStyle  Anesthesia: GETA  Medication used: refer to anesthesia documentation.  Complications: None.  Estimated blood loss: 50 mL  Specimen: None.  Findings: Occlusion of the right ICA terminus. MT performed with direct contact aspiration, one pass, with complete recanalization.  No hemorrhagic complication on flat panel head CT.  The patient tolerated the procedure well without incident.

## 2020-05-12 NOTE — Progress Notes (Signed)
eLink Physician-Brief Progress Note Patient Name: [name] [name] DOB: 06/13/1950 MRN: [id number]   Date of Service  05/12/2020  HPI/Events of Note  On both pepcid and protonix  eICU Interventions  Discontinued pepcid     Intervention Category Intermediate Interventions: Best-practice therapies (e.g. DVT, beta blocker, etc.)  Darl Pikes 05/12/2020, 9:36 PM

## 2020-05-12 NOTE — Progress Notes (Signed)
Reassessed patient at bedside, following commands in all four extremities.  Off all vasopressors and hemodynamically stable.  Will follow.    Canary Brim, MSN, APRN, NP-C, AGACNP-BC [facility] 05/12/2020, 5:15 PM   Please see [email or web address] for pager details.   From 7A-7P if no response, please call [phone] After hours, please call ELink [phone]

## 2020-05-12 NOTE — Anesthesia Procedure Notes (Signed)
Central Venous Catheter Insertion Performed by: Beryle Lathe, MD, anesthesiologist Start/End5/02/2020 2:52 PM, 05/12/2020 2:59 PM Patient location: [facility] procedure area. Emergency situation Preanesthetic checklist: patient identified, IV checked, monitors and equipment checked, pre-op evaluation, timeout performed and anesthesia consent Position: supine Patient sedated Hand hygiene performed , maximum sterile barriers used  and Seldinger technique used Catheter size: 8 Fr Central line was placed.Double lumen Procedure performed using ultrasound guided technique. Ultrasound Notes:anatomy identified, needle tip was noted to be adjacent to the nerve/plexus identified, no ultrasound evidence of intravascular and/or intraneural injection and image(s) printed for medical record Attempts: 1 Following insertion, line sutured, dressing applied and Biopatch. Post procedure assessment: blood return through all ports, free fluid flow and no air  Patient tolerated the procedure well with no immediate complications.

## 2020-05-12 NOTE — Consult Note (Addendum)
Cardiology Consultation:   Patient ID: [name] [name] MRN: [id number]; DOB: January 05, 1951  Admit date: 05/12/2020 Date of Consult: 05/12/2020  PCP:  [name] Leitz, MD   [facility]  Cardiologist:  [name] Salines, DO  Advanced Practice Provider:  No care team member to display Electrophysiologist:  None   Patient Profile:   [name] [name] is a 70 y.o. female with a hx of PAF/flutter, SSS, chronic combined CHF, pulmonary HTN, prior pericardial effusion, HTN, HLD, bipolar disorder, diabetes mellitus who is being seen today for the evaluation of atrial fib/flutter and CHF at the request of [name] [name].  History of Present Illness:   [name] [name] has a remote history of pericardial effusion in 2012 while admitted for liver abscess - this was felt reactive in nature without tamponade and did not require intervention. She was later admitted 07/2019 with new onset atrial fib with difficult-to-control heart rates. Her AV nodal blocking agents could not be increased further due to episodes of bradycardia and short pauses (1.7 sec).  An echocardiogram demonstrated reduced LV function with EF 40-45%, this was thought to be tachycardia mediated.  She underwent TEE-DCCV w/ restoration of normal sinus rhythm and improved symptoms. She was managed as an outpatient for CHF med titration but this appeared wrought with issues of inconsistent dosing, failure to follow up further, and patient self-discontinuation (I.e. patient stopped Entresto due to reading about potential liver effects). She was in the hospital 01/2020 with Covid and atrial flutter in the context of not taking her medications. She refused to consider TEE/cardioversion. She was treated with rate control strategy and anticoagulation. 2D Echo at that time showed EF 25-30%, diffuse HK, severe concentric LVH, moderately reduced RV function, moderately elevated RVSP, mild MR - evaluation of infiltrative cardiomyopathies such as  amyloidosis should be considered. She cancelled her post-hospital follow-up. She established care with Dr. Harriet [name] in 03/2020 due to requested provider switch. She was edematous so metolazone was added to Lasix 41m BID at that time. The patient declined any further cardiac testing including heart cath or amyloid imaging. Per community telephone outreach 05/07/20, atorvastatin rx fill record noncompliance was identified but the patient refused to be identified on the phone so the call was not completed.   History is obtained in chart review and talking to daughter at bedside. She was admitted with an acute stroke this morning. Per report she had stopped her Eliquis 1 week prior for unclear reasons. She was on a video call with family and noted to have fallen while doing so. Afterwards she complained of feeling unwell and then family noticed she was making an unusual noise with garbled speech. She also had left sided facial droop, left sided weakness. She had also apparently had some abdominal pain prior to this event. She otherwise had been doing "okay" but did have DOE. No CP reported. She was initially hypertensive 160s/100s. CTA of the head showed T-shaped large vessel occlusion involving the right ICA terminus, proximal M1 right middle cerebral artery and proximal A1 right anterior cerebral artery. She received TPA but this was initially delayed due to patient refusal before agreeing. In IR she was intubated, sedated and placed on mechanical ventilation and underwent thrombectomy of the clot with revascularization. After completion of the procedure, her blood pressure rapidly dropped and she went into PEA arrest. She received epi x2 and was placed on vasopressin, neo, amio, and epinephrine. In discussing with PCCM it is felt perhaps her hypotension was related in part  to her sensitivity to sedation. This was able to be weaned with SBP in the 90s but this evening BP is now in the mid 01V systolic so she will be  started on levophed. She was also started on amiodarone for what appears to be rapid atrial flutter. Per notes, holding anticoagulation further until OK'd by neuro.  Labs otherwise notable for hypoalbuminemia 2.6, Cr 1.09, hypokalemia of 3.4, hypomagnesemia 1.4, Covid negative, hsTroponin 99.    Past Medical History:  Diagnosis Date  . Abdominal pain   . Atrial flutter ([place])   . Back pain   . Chronic combined systolic and diastolic CHF (congestive heart failure) ([place]) 02/04/2020  . Constipation   . Diabetes mellitus without complication ([place])   . Fatigue   . Full dentures   . Hemorrhoids   . History of noncompliance with medical treatment, presenting hazards to health   . Hypertension   . Lack of adequate sleep   . Liver mass   . PAF (paroxysmal atrial fibrillation) ([place])   . Rash    on right breast  . Wears glasses     Past Surgical History:  Procedure Laterality Date  . BREAST DUCTAL SYSTEM EXCISION    . BREAST EXCISIONAL BIOPSY Left   . CARDIOVERSION N/A 07/31/2019   Procedure: CARDIOVERSION;  Surgeon: [name] Casino, MD;  Location: [facility] ENDOSCOPY;  Service: Cardiovascular;  Laterality: N/A;  . CHOLECYSTECTOMY    . CHOLECYSTECTOMY, LAPAROSCOPIC  07/24/2010  . ESOPHAGEAL DILATION    . IR PERCUTANEOUS ART THROMBECTOMY/INFUSION INTRACRANIAL INC DIAG ANGIO  05/12/2020      . TEE WITHOUT CARDIOVERSION N/A 07/31/2019   Procedure: TRANSESOPHAGEAL ECHOCARDIOGRAM (TEE);  Surgeon: [name] Casino, MD;  Location[facility] ENDOSCOPY;  Service: Cardiovascular;  Laterality: N/A;     Home Medications:  Prior to Admission medications   Medication Sig Start Date End Date Taking? Authorizing Provider  amiodarone (PACERONE) 200 MG tablet Take 1 tablet (200 mg total) by mouth 2 (two) times daily for 12 days. 02/07/20 02/19/20  [name] Mango L, DO  amiodarone (PACERONE) 200 MG tablet Take 1 tablet (200 mg total) by mouth daily. 02/21/20   Anderson, Chelsey L, DO  amLODipine (NORVASC) 2.5 MG  tablet Take 2.5 mg by mouth daily. 02/09/20   [provider]  B-D UF III MINI PEN NEEDLES 31G X 5 MM MISC USE WITH LANTUS PEN DAILY AS DIRECTED 02/11/20   [name] Leitz, MD  Blood Pressure KIT Monitor blood pressure twice a day 09/28/19   [name] Leitz, MD  ELIQUIS 5 MG TABS tablet TAKE 1 TABLET(5 MG) BY MOUTH TWICE DAILY 04/10/20   Tobb, Kardie, DO  fluticasone (FLONASE) 50 MCG/ACT nasal spray Place 2 sprays into both nostrils daily. 02/09/20   [provider]  furosemide (LASIX) 40 MG tablet TAKE 1 TABLET(40 MG) BY MOUTH TWICE DAILY 03/03/20   [name] Leitz, MD  glucose blood (ACCU-CHEK AVIVA) test strip Use as instructed 06/16/17   Zenia Resides, MD  insulin glargine (LANTUS) 100 UNIT/ML Solostar Pen Inject 18 Units into the skin daily. 02/07/20   Anderson, Chelsey L, DO  Lancets (ACCU-CHEK SOFT TOUCH) lancets Once daily testing. 06/16/17   Zenia Resides, MD  Magnesium Oxide 400 MG CAPS Take 1 capsule (400 mg total) by mouth daily. 08/17/19   Richardson Dopp T, PA-C  metolazone (ZAROXOLYN) 5 MG tablet Take 30 minutes before Lasix for 7 days 03/18/20   Tobb, Kardie, DO  metoprolol succinate (TOPROL-XL) 100 MG 24 hr tablet  Take 1 tablet (100 mg total) by mouth 2 (two) times daily. ** DO NOT CRUSH **  (BETA BLOCKER) 02/07/20 03/08/20  Anderson, Chelsey L, DO  Semaglutide,0.25 or 0.5MG/DOS, (OZEMPIC, 0.25 OR 0.5 MG/DOSE,) 2 MG/1.5ML SOPN Inject 0.5 mg into the skin once a week. Patient taking differently: Inject 0.5 mg into the skin every Monday. 11/14/19   Leavy Cella, RPH-CPP    Inpatient Medications: Scheduled Meds: .  stroke: mapping our early stages of recovery book   Does not apply Once  . amiodarone  150 mg Intravenous Once  . chlorhexidine gluconate (MEDLINE KIT)  15 mL Mouth Rinse BID  . docusate  100 mg Per Tube BID  . feeding supplement (PROSource TF)  45 mL Per Tube BID  . feeding supplement (VITAL HIGH PROTEIN)  1,000 mL Per Tube Q24H  . iohexol      . mouth rinse  15 mL  Mouth Rinse 10 times per day  . midazolam      . pantoprazole (PROTONIX) IV  40 mg Intravenous QHS  . polyethylene glycol  17 g Per Tube Daily   Continuous Infusions: . sodium chloride    . amiodarone     Followed by  . amiodarone    . clevidipine    . clevidipine    . dexmedetomidine (PRECEDEX) IV infusion    . famotidine (PEPCID) IV    . norepinephrine (LEVOPHED) Adult infusion 2 mcg/min (05/12/20 1730)   PRN Meds: fentaNYL (SUBLIMAZE) injection, senna-docusate  Allergies:    Allergies  Allergen Reactions  . Fruit & Vegetable Daily [Nutritional Supplements] Swelling and Other (See Comments)    All fruits cause tongue swelling and numbness Organic fruits are okay to eat  . Other Swelling    Tree nuts cause tongue swelling and numbness  . Peanut-Containing Drug Products Swelling and Other (See Comments)    Tongue swelling and numbness  . Aspirin Nausea And Vomiting and Other (See Comments)    Cannot take due to liver issues Other reaction(s): Unknown  . Tylenol [Acetaminophen] Other (See Comments)    Cannot take due to liver issues    Social History:   Social History   Socioeconomic History  . Marital status: Single    Spouse name: Not on file  . Number of children: 3  . Years of education: Not on file  . Highest education level: High school graduate  Occupational History  . Occupation: Retired    Fish farm manager: OTHER    Comment: CNA  Tobacco Use  . Smoking status: Never Smoker  . Smokeless tobacco: Never Used  Vaping Use  . Vaping Use: Never used  Substance and Sexual Activity  . Alcohol use: No    Alcohol/week: 0.0 standard drinks  . Drug use: No  . Sexual activity: Not Currently  Other Topics Concern  . Not on file  Social History Narrative   Patient lives alone in an apartment complex for seniors.   Patient does not drive. She walks to surrounding places near her complex or her children run errands for her. Patient never goes without.   Patient enjoys  sewing, crafts, shopping, and walking.    Patient has 3 children and she is close with all of them.        Social Determinants of Health   Financial Resource Strain: Not on file  Food Insecurity: Not on file  Transportation Needs: Not on file  Physical Activity: Not on file  Stress: Not on file  Social Connections:  Not on file  Intimate Partner Violence: Not on file    Family History:   Family History  Problem Relation Age of Onset  . Stroke Mother   . Other Father        broken heart after spouse spouse  . Diabetes Sister   . Hypertension Sister   . Diabetes Sister   . Multiple sclerosis Sister   . Alcohol abuse Other   . Diabetes Other   . Breast cancer Neg Hx      ROS:  Unable to assess due to intubated/sedated  Physical Exam/Data:   Vitals:   05/12/20 1645 05/12/20 1700 05/12/20 1715 05/12/20 1730  BP: (!) 139/115 93/81 (!) 87/66   Pulse: (!) 129 (!) 116 (!) 116 (!) 116  Resp: (!) 28 (!) 22 (!) 22 (!) 22  Temp:      TempSrc:      SpO2: 100% 97% (!) 87% (!) 88%  Weight:        Intake/Output Summary (Last 24 hours) at 05/12/2020 1736 Last data filed at 05/12/2020 1441 Gross per 24 hour  Intake 350 ml  Output --  Net 350 ml   Last 3 Weights 05/12/2020 03/18/2020 03/05/2020  Weight (lbs) 197 lb 15.6 oz 200 lb 206 lb 12.8 oz  Weight (kg) 89.8 kg 90.719 kg 93.804 kg     Body mass index is 35.07 kg/m.  General: Critically ill, intubated AAF in no acute distress. Head: Normocephalic, atraumatic, sclera non-icteric, no xanthomas, nares are without discharge. Neck: JVP not elevated. Lungs: Mechanical BS bilaterally with coarse BS, no acute wheezing or rhonchi Heart: Reg rhythm tachycardic S1 S2 without murmurs, rubs, or gallops.  Abdomen: Soft, non-tender, non-distended with normoactive bowel sounds. No rebound/guarding. Extremities: No clubbing or cyanosis. 1+ BLE edema. Distal pedal pulses are 2+ and equal bilaterally. Neuro: sedated on vent but follows few simple  commands i.e. stick out tongue Psych: Unable to assess given intubated  EKG:  The EKG was personally reviewed and demonstrates:  Atrial flutter  Telemetry:  Telemetry was personally reviewed and demonstrates:  Atrial flutter    Relevant CV Studies: 2d echo 02/04/20 1. Since the last study on 07/27/2019 LVEF has decreased, now severely  impaired at 25-30% and diffuse hypokinesis. Severe concentric LVH. RVEF is  at least moderately decreased. Evaluation for infiltrative  cardiomyopathies such as cardiac amyloidosis  should be considered.  2. Left ventricular ejection fraction, by estimation, is 25 to 30%. The  left ventricle has severely decreased function. The left ventricle has no  regional wall motion abnormalities. There is severe concentric left  ventricular hypertrophy. Left  ventricular diastolic function could not be evaluated.  3. Right ventricular systolic function is moderately reduced. The right  ventricular size is moderately enlarged. There is moderately elevated  pulmonary artery systolic pressure. The estimated right ventricular  systolic pressure is 96.4 mmHg.  4. Left atrial size was mildly dilated.  5. The mitral valve is normal in structure. Mild mitral valve  regurgitation. No evidence of mitral stenosis.  6. Tricuspid valve regurgitation is moderate to severe.  7. The aortic valve is normal in structure. Aortic valve regurgitation is  not visualized. Mild to moderate aortic valve sclerosis/calcification is  present, without any evidence of aortic stenosis.  8. The inferior vena cava is normal in size with greater than 50%  respiratory variability, suggesting right atrial pressure of 3 mmHg.   Laboratory Data:  High Sensitivity Troponin:   Recent Labs  Lab 05/12/20 1550  TROPONINIHS 99*     Chemistry Recent Labs  Lab 05/12/20 1220 05/12/20 1228 05/12/20 1430  NA 135 137 136  K 3.4* 3.4* 4.1  CL 100 101  --   CO2 22  --   --   GLUCOSE 257*  253*  --   BUN 15 17  --   CREATININE 1.09* 0.90  --   CALCIUM 8.8*  --   --   GFRNONAA 55*  --   --   ANIONGAP 13  --   --     Recent Labs  Lab 05/12/20 1220  PROT 6.4*  ALBUMIN 2.6*  AST 47*  ALT 26  ALKPHOS 106  BILITOT 1.0   Hematology Recent Labs  Lab 05/12/20 1220 05/12/20 1228 05/12/20 1430  WBC 6.2  --   --   RBC 5.38*  --   --   HGB 13.5 15.3* 14.3  HCT 42.8 45.0 42.0  MCV 79.6*  --   --   MCH 25.1*  --   --   MCHC 31.5  --   --   RDW 15.5  --   --   PLT 242  --   --    BNPNo results for input(s): BNP, PROBNP in the last 168 hours.  DDimer No results for input(s): DDIMER in the last 168 hours.   Radiology/Studies:  DG Abd 1 View  Result Date: 05/12/2020 CLINICAL DATA:  Enteric catheter placement EXAM: ABDOMEN - 1 VIEW COMPARISON:  None. FINDINGS: Frontal view of the lower chest and upper abdomen demonstrates enteric catheter passing below diaphragm tip overlying proximal duodenum. External defibrillator pads overlie the lower chest. Bowel gas pattern is unremarkable. IMPRESSION: 1. Enteric catheter tip projecting over proximal duodenum. Electronically Signed   By: Randa Ngo M.D.   On: 05/12/2020 16:28   CT ANGIO NECK W OR WO CONTRAST  Result Date: 05/12/2020 CLINICAL DATA:  Left-sided weakness EXAM: CT ANGIOGRAPHY HEAD AND NECK TECHNIQUE: Multidetector CT imaging of the head and neck was performed using the standard protocol during bolus administration of intravenous contrast. Multiplanar CT image reconstructions and MIPs were obtained to evaluate the vascular anatomy. Carotid stenosis measurements (when applicable) are obtained utilizing NASCET criteria, using the distal internal carotid diameter as the denominator. CONTRAST:  11m OMNIPAQUE IOHEXOL 350 MG/ML SOLN COMPARISON:  Noncontrast head CT performed earlier today 05/12/2020. FINDINGS: CTA NECK FINDINGS Aortic arch: Standard aortic branching. No hemodynamically significant innominate or proximal  subclavian artery stenosis. Right carotid system: CCA and ICA patent within the neck without significant stenosis (50% or greater). Mild soft and calcified plaque within the carotid bifurcation and proximal ICA. Asymmetrically diminished enhancement of the cervical right ICA, likely due to downstream occlusion. Left carotid system: CCA and ICA patent within the neck without significant stenosis (50% or greater). Mild soft and calcified plaque within the carotid bifurcation and proximal ICA. Vertebral arteries: Patent within the neck without stenosis. Duplicated proximal left vertebral artery. Skeleton: No acute bony abnormality or aggressive osseous lesion. Cervical spondylosis. Other neck: Asymmetric prominence of the right thyroid lobe. Underlying right thyroid lobe nodule measuring at least 2.8 cm. Upper chest: Partially imaged left pleural effusion. Review of the MIP images confirms the above findings CTA HEAD FINDINGS Anterior circulation: Asymmetrically diminished enhancement and caliber of the right ICA siphon, likely secondary to downstream occlusion. Absent enhancement within the right ICA terminus, proximal M1 right MCA and proximal A1 right ACA. Findings are compatible with T-shaped occlusion secondary to thrombus. There is some  reconstitution of enhancement more distally within the M1 right MCA and within right M2 vessels. There is reconstitution of enhancement within the distal A1 right ACA. The intracranial left ICA is patent. No left M2 proximal branch occlusion or high-grade proximal stenosis is identified. Developmentally hypoplastic A1 left ACA. The A2 and more distal anterior cerebral arteries are patent. No intracranial aneurysm is identified. Posterior circulation: The intracranial vertebral arteries are patent. The basilar artery is patent. The posterior cerebral arteries are patent. A right posterior communicating artery is present. The left posterior communicating artery is hypoplastic or  absent. Venous sinuses: Within the limitations of contrast timing, no convincing thrombus. Anatomic variants: As described Review of the MIP images confirms the above findings Critical/emergent results were called by telephone at the time of interpretation on 05/12/2020 at 12:53 pm to provider ERIC [facility] , who verbally acknowledged these results. IMPRESSION: CTA neck: 1. The common carotid, internal carotid and vertebral arteries are patent within the neck without hemodynamically significant stenosis. Mild atherosclerotic disease within the carotid bifurcations and proximal ICAs. 2. Asymmetrically diminished enhancement of the cervical right ICA, likely secondary to downstream occlusion. 3. 2.8 cm right thyroid lobe nodule. Non-emergent thyroid ultrasound is recommended for further evaluation. CTA head: T-shaped large vessel occlusion involving the right ICA terminus, proximal M1 right middle cerebral artery and proximal A1 right anterior cerebral artery. Electronically Signed   By: Kellie Simmering DO   On: 05/12/2020 13:13   Portable Chest x-ray  Result Date: 05/12/2020 CLINICAL DATA:  Stroke and respiratory failure. EXAM: PORTABLE CHEST 1 VIEW COMPARISON:  02/04/2020 FINDINGS: Stable cardiac enlargement. Endotracheal tube present with the tip approximately 3 cm above the carina. Right jugular central line present with the tip in the lower SVC. No pneumothorax. Left lower lobe atelectasis/consolidation present. No significant pulmonary edema or pleural fluid. IMPRESSION: Left lower lobe atelectasis/consolidation.  No pneumothorax. Electronically Signed   By: Aletta Edouard M.D.   On: 05/12/2020 16:25   CT HEAD CODE STROKE WO CONTRAST  Result Date: 05/12/2020 CLINICAL DATA:  Code stroke. Neuro deficit, acute, stroke suspected. Additional history provided: Left-sided weakness. EXAM: CT HEAD WITHOUT CONTRAST TECHNIQUE: Contiguous axial images were obtained from the base of the skull through the vertex without  intravenous contrast. COMPARISON:  Prior head CT examinations 05/10/2010. FINDINGS: Brain: Cerebral volume is normal. There is no acute intracranial hemorrhage. No demarcated cortical infarct. No extra-axial fluid collection. No evidence of intracranial mass. No midline shift. Vascular: No hyperdense vessel.  Atherosclerotic calcifications Skull: Normal. Negative for fracture or focal lesion. Sinuses/Orbits: Redemonstrated bilateral proptosis. Rightward gaze. No significant paranasal sinus disease. ASPECTS [facility] Stroke Program Early CT Score) - Ganglionic level infarction (caudate, lentiform nuclei, internal capsule, insula, M1-M3 cortex): 7 - Supraganglionic infarction (M4-M6 cortex): 3 Total score (0-10 with 10 being normal): 10 These results were communicated to Dr. Cheral Marker at 12:40 pmon 5/2/2022by text page via the [facility] messaging system. IMPRESSION: No evidence of acute intracranial abnormality.  ASPECTS is 10. Redemonstrated bilateral proptosis. Electronically Signed   By: Kellie Simmering DO   On: 05/12/2020 12:41   CT ANGIO HEAD CODE STROKE  Result Date: 05/12/2020 CLINICAL DATA:  Left-sided weakness EXAM: CT ANGIOGRAPHY HEAD AND NECK TECHNIQUE: Multidetector CT imaging of the head and neck was performed using the standard protocol during bolus administration of intravenous contrast. Multiplanar CT image reconstructions and MIPs were obtained to evaluate the vascular anatomy. Carotid stenosis measurements (when applicable) are obtained utilizing NASCET criteria, using the distal internal carotid diameter  as the denominator. CONTRAST:  16m OMNIPAQUE IOHEXOL 350 MG/ML SOLN COMPARISON:  Noncontrast head CT performed earlier today 05/12/2020. FINDINGS: CTA NECK FINDINGS Aortic arch: Standard aortic branching. No hemodynamically significant innominate or proximal subclavian artery stenosis. Right carotid system: CCA and ICA patent within the neck without significant stenosis (50% or greater). Mild soft and  calcified plaque within the carotid bifurcation and proximal ICA. Asymmetrically diminished enhancement of the cervical right ICA, likely due to downstream occlusion. Left carotid system: CCA and ICA patent within the neck without significant stenosis (50% or greater). Mild soft and calcified plaque within the carotid bifurcation and proximal ICA. Vertebral arteries: Patent within the neck without stenosis. Duplicated proximal left vertebral artery. Skeleton: No acute bony abnormality or aggressive osseous lesion. Cervical spondylosis. Other neck: Asymmetric prominence of the right thyroid lobe. Underlying right thyroid lobe nodule measuring at least 2.8 cm. Upper chest: Partially imaged left pleural effusion. Review of the MIP images confirms the above findings CTA HEAD FINDINGS Anterior circulation: Asymmetrically diminished enhancement and caliber of the right ICA siphon, likely secondary to downstream occlusion. Absent enhancement within the right ICA terminus, proximal M1 right MCA and proximal A1 right ACA. Findings are compatible with T-shaped occlusion secondary to thrombus. There is some reconstitution of enhancement more distally within the M1 right MCA and within right M2 vessels. There is reconstitution of enhancement within the distal A1 right ACA. The intracranial left ICA is patent. No left M2 proximal branch occlusion or high-grade proximal stenosis is identified. Developmentally hypoplastic A1 left ACA. The A2 and more distal anterior cerebral arteries are patent. No intracranial aneurysm is identified. Posterior circulation: The intracranial vertebral arteries are patent. The basilar artery is patent. The posterior cerebral arteries are patent. A right posterior communicating artery is present. The left posterior communicating artery is hypoplastic or absent. Venous sinuses: Within the limitations of contrast timing, no convincing thrombus. Anatomic variants: As described Review of the MIP images  confirms the above findings Critical/emergent results were called by telephone at the time of interpretation on 05/12/2020 at 12:53 pm [facility] these results. IMPRESSION: CTA neck: 1. The common carotid, internal carotid and vertebral arteries are patent within the neck without hemodynamically significant stenosis. Mild atherosclerotic disease within the carotid bifurcations and proximal ICAs. 2. Asymmetrically diminished enhancement of the cervical right ICA, likely secondary to downstream occlusion. 3. 2.8 cm right thyroid lobe nodule. Non-emergent thyroid ultrasound is recommended for further evaluation. CTA head: T-shaped large vessel occlusion involving the right ICA terminus, proximal M1 right middle cerebral artery and proximal A1 right anterior cerebral artery. Electronically Signed   By: KKellie SimmeringDO   On: 05/12/2020 13:13   Assessment and Plan:   1. Acute stroke  - suspect embolic due to nonadherence to anticoagulation for persistent atrial fib/flutter - s/p TPA, mechanical thrombectomy - per neuro  2. Acute hypoxic respiratory failure with hypoxemia - placed on ventilator - per discussion with PCCM, pulse ox may not be accurate currently - they have been monitoring her ABG  3. PEA arrest with persistent hypotension post-thrombectomy - will review with MD - per review with PCCM, given recurrent/persistent hypotension, they will be starting low dose levophed for BP augmentation  4. Acute on chronic combined CHF/cardiomyopathy - etiology previously felt possibly tachy-mediated but patient declined further ischemic and infiltrative work-ups - GDMT has been challenging due to patient's self-discontinuation of medications - in preliminary discussions, Dr. TRadford Paxindicates that the patient's daughter would  like both palliative care and advanced heart failure involved to review options. Sent message to cardmaster to ask AHF to see and I  placed/called palliative care request - co-ox pending - per Dr. Radford Pax, stat echo ordered and she signed out to on-call MD   5. Persistent atrial flutter with prior history of atrial fib - patient had self discontinued anticoagulation prior to admission, making management very chalelnging  - she is not currently a candidate for repeat TEE/DCCV in present state and therefore rate control strategy may be the most appropriate given nonadherence - no anticoagulation until OK with neuro - will need to involve patient/family in long term goals of care - will review plans for amiodarone with MD  6. Hyperlipidemia - revisit statin when taking orals   7. History of small pericardial effusion - relook with echo  8. Hypokalemia/hypomagnesemia - initial K 3.4, repeat 1430 was 4.1 - Mg 1.4, no repletion yet, replete Mg with 4g mag sulfate and follow  9. Thyroid nodule - incidental finding on CT, needs OP f/u - TSH with AM labs  Risk Assessment/Risk Scores:       New York Heart Association (NYHA) Functional Class Unable to assess, intubated/sedated   CHA2DS2-VASc Score = 7  This indicates a 11.2% annual risk of stroke. The patient's score is based upon: CHF History: Yes HTN History: Yes Diabetes History: Yes Stroke History: Yes Vascular Disease History: No Age Score: 1 Gender Score: 1       For questions or updates, please contact [place] Please consult [email or web address] for contact info under    Signed, Charlie Pitter, PA-C  05/12/2020 5:36 PM

## 2020-05-12 NOTE — Consult Note (Signed)
NAME:  [name] [name], MRN:  [id number], DOB:  1950/03/03, LOS: 0 ADMISSION DATE:  05/12/2020, CONSULTATION DATE:  5/2 REFERRING MD:  Dr. Corliss Skains, CHIEF COMPLAINT:  Left sided weakness and facial droop   History of Present Illness:  70 yo F who present to [facility] 5/2 with reports left sided facial droop and weakness.  Patient has a pertinent PMH of Diabetes, Chronic combined HF, HTN, and A-Fib. No previous history of stroke. Never smoker. She was recently admitted on 01/2020 with Covid and A-Fib RVR. She takes Amiodarone and Eliquis for A-Fib. Home medications also include: Amlodipine, lasix, magnesium, metolazone, metoprolol, insulin, semaglutide. She lives alone and is largely independent of ADL's but does have exertional dyspnea. She was recently approved for a CNA to assist at home.  She was previously recommended for LHC to further work up heart failure but refused procedure.  She has refused medications in the past after reading the side effect profiles.  She presented 5/2 with left sided facial droop and weakness. She stopped taking her home Eliquis about a week ago for unclear reasons. Family states she was normal at 10 am, but noticed at 11:30 am she had difficulty speaking and left sided facial droop while using FaceTime so they called 911. Paramedics noticed left sided weakness on arrival.   Patient arrived to the ED as Code Stroke. Airway intact and patient sent to CT for neuroimaging.  CTA shows T-shaped large vessel occlusion involving the R ICA terminus, proximal M1 R MCA and proximal A1 R ACA with mild atherosclerotic disease within the carotid bifurcations and proximal ICAs. Mental status declined and was refusing tPA initially. Patient was felt not to be in a position to make decisions and daughter agreed for team to proceed to go to IR for clot removal and started tPA. Cleviprex and Labetalol started for patients A-Fib with RVR and BP control.   PCCM consulted for evaluation, ICU  care.   Pertinent  Medical History  Diabetes Type 2 Chronic combined HF / HFrEF-LVEF 25-30% 01/2020. Chronic A-Fib on Eliquis HTN Nonrheumatic tricuspid valve regurgitation 03/2020 Covid Hyperlipidemia  Significant Hospital Events: Including procedures, antibiotic start and stop dates in addition to other pertinent events   5/2 - Patient admitted to hospital Code Stroke with left sided weakness/facial droop. CTA shows vessel occlusion of the R ICA terminus, proximal M1 R MCA and proximal A1 R ACA with mild atherosclerotic disease within the carotid bifurcations and proximal ICAs.  Cleviprex and Labetolol started for Afib with RVR and HTN. Patient sent to IR for thrombectomy and was intubated for procedure. IR had succesful mechanical thrombectomy of the clot with complete vascularization.  After procedure, patient became hypotensive and went into a PEA arrest. Chest compressions were started and patient receive a total of 2 epi. Patient stabilized. Central line placed and patient placed on pressors (Vaso, Epi, Neo). Patient transported to 4N ICU.  Interim History / Subjective:  Patient intubated on 100% and 10 Peep On 3 pressors (Epi, Neo, Vaso) and on Amiodarone on arrival to ICU, quickly weaned off  Sensitive to sedation > 50 mcg fentanyl given and SBP decreased from 160's to 90's  Objective   Blood pressure (!) 163/104, pulse (!) 147, temperature 98.3 F (36.8 C), temperature source Oral, resp. rate (!) 29, weight 89.8 kg, SpO2 98 %.        Intake/Output Summary (Last 24 hours) at 05/12/2020 1504 Last data filed at 05/12/2020 1441 Gross per 24 hour  Intake 350  ml  Output --  Net 350 ml   Filed Weights   05/12/20 1200  Weight: 89.8 kg    Examination: General:  critically ill appearing, intubated, Afebrile HEENT: MM pink/moist, pupil equal round 90mm, ET tube in place Neuro: sedate (recently received roc) CV: s1s2, no m/r/g, tachycardia PULM:  Coarse BS throughout; mech vented on  PRVC 100%, 10 peep; Ppeak 35 and plat 27; bloody secretions in ETT GI: soft, bsx4 active  Extremities: Cool, 2+ edema BLE up to knee; DP appreciated with doppler Skin: no rashes or lesions  Labs/imaging that I havepersonally reviewed  (right click and "Reselect all SmartList Selections" daily)    Resolved Hospital Problem list     Assessment & Plan:   Acute Ischemic R ICA Stroke Right internal carotid occlusion Suspect secondary to AF / medication non-compliance with Eliquis.  Alteplase given; succesful mechanical thrombectomy of the clot with complete vascularization. Neuro exam post procedure> followed commands x4 extremities.  -Frequent neuro checks  -No antiplatelet for 24 hours post tPA -BP goals 120-140 -begin atorvastatin -PT consulted -SSI with basal insulin started -assess A1c  -Defer carotid US, Echo with bubble (had ECHO in 01/2020), MRI/MRA, and further neuro imaging to neurology  Acute Respiratory Failure w/ hypoxia -PRVC 8cc/kg as rest mode overnight  -SBT / WUA in am for possible extubation  -wean PEEP / FiO2 for sats >90% -CXR shows ET tube in good position. LLL opacity.  -ABG ordered -VAP protocol -RASS Goal 0 to -1   Acute on Chronic Combined CHF / HFrEF with Cardiogenic shock s/p Cardiac Arrest  Post arrest in IR, received epi x2, arrived on 3 vasopressors but quickly weaned off post arrival.  -Continue to wean pressors as tolerated (SBP goal 120-140) -Continue Amio gtt  -levophed gtt if needed to maintain SBP goal as above -tele monitoring  -checking labs: coox, trop, lactate, mag, phos -EKG ordered -note patient sensitive to sedation > minimize as able  -appreciate Cardiology assistance with patient care   Afib with RVR -Continue Amio -Home meds on hold: Metoprolol and Xarelto  Hypokalemia  Hypomagnesemia  -replace K/Mg+ -follow up in am 5/3  DM2 Hyperglycemia -SSI and basal insulin -Q4 CBG -follow up CMP in morning  HTN -Home meds on  hold: amlodipine, lasix, metolazone  Thyromegaly  Noted on CT, mild enlargement  -consider outpatient imaging  -assess TSH   At Risk Malnutrition  -begin TF   Best practice (right click and "Reselect all SmartList Selections" daily)  Diet:  NPO Pain/Anxiety/Delirium protocol (if indicated): Yes (RASS goal -1) VAP protocol (if indicated): Yes DVT prophylaxis: SCD GI prophylaxis: PPI Glucose control:  Insulin gtt, SSI Yes and Basal insulin Yes Central venous access:  Yes, and it is still needed Arterial line:  Yes, and it is still needed Foley:  Yes, and it is still needed Mobility:  bed rest  PT consulted: Yes Last date of multidisciplinary goals of care discussion: 5/2 plan of care discussed with family.   Code Status:  full code Disposition: ICU  Labs   CBC: Recent Labs  Lab 05/12/20 1220 05/12/20 1228  WBC 6.2  --   NEUTROABS 3.4  --   HGB 13.5 15.3*  HCT 42.8 45.0  MCV 79.6*  --   PLT 242  --     Basic Metabolic Panel: Recent Labs  Lab 05/12/20 1220 05/12/20 1228  NA 135 137  K 3.4* 3.4*  CL 100 101  CO2 22  --   GLUCOSE 257*  253*  BUN 15 17  CREATININE 1.09* 0.90  CALCIUM 8.8*  --    GFR: Estimated Creatinine Clearance: 63.7 mL/min (by C-G formula based on SCr of 0.9 mg/dL). Recent Labs  Lab 05/12/20 1220  WBC 6.2    Liver Function Tests: Recent Labs  Lab 05/12/20 1220  AST 47*  ALT 26  ALKPHOS 106  BILITOT 1.0  PROT 6.4*  ALBUMIN 2.6*   No results for input(s): LIPASE, AMYLASE in the last 168 hours. No results for input(s): AMMONIA in the last 168 hours.  ABG    Component Value Date/Time   TCO2 22 05/12/2020 1228     Coagulation Profile: Recent Labs  Lab 05/12/20 1220  INR 1.3*    Cardiac Enzymes: No results for input(s): CKTOTAL, CKMB, CKMBINDEX, TROPONINI in the last 168 hours.  HbA1C: Hemoglobin A1C  Date/Time Value Ref Range Status  11/09/2019 11:11 AM 8.3 (A) 4.0 - 5.6 % Final   HbA1c, POC (controlled diabetic  range)  Date/Time Value Ref Range Status  12/30/2017 10:02 AM 9.4 (A) 0.0 - 7.0 % Final  09/06/2017 01:31 PM 8.8 (A) 0.0 - 7.0 % Final   HbA1c POC (<> result, manual entry)  Date/Time Value Ref Range Status  07/26/2019 11:08 AM >15.0 4.0 - 5.6 % Final   Hgb A1c MFr Bld  Date/Time Value Ref Range Status  02/04/2020 06:57 AM 10.4 (H) 4.8 - 5.6 % Final    Comment:    (NOTE) Pre diabetes:          5.7%-6.4%  Diabetes:              >6.4%  Glycemic control for   <7.0% adults with diabetes     CBG: Recent Labs  Lab 05/12/20 1221  GLUCAP 242*    Review of Systems:   Patient intubated; unable to obtain.  Information obtained from staff at bedside, chart review and family.   Past Medical History:  She,  has a past medical history of Abdominal pain, Back pain, Chronic combined systolic and diastolic CHF (congestive heart failure) ([place]) (02/04/2020), Constipation, Diabetes mellitus without complication ([place]), Fatigue, Full dentures, Hemorrhoids, Hypertension, Lack of adequate sleep, Liver mass, Rash, and Wears glasses.   Surgical History:   Past Surgical History:  Procedure Laterality Date  . BREAST DUCTAL SYSTEM EXCISION    . BREAST EXCISIONAL BIOPSY Left   . CARDIOVERSION N/A 07/31/2019   Procedure: CARDIOVERSION;  Surgeon: Pixie Casino, MD;  Location: [facility] ENDOSCOPY;  Service: Cardiovascular;  Laterality: N/A;  . CHOLECYSTECTOMY    . CHOLECYSTECTOMY, LAPAROSCOPIC  07/24/2010  . ESOPHAGEAL DILATION    . IR PERCUTANEOUS ART THROMBECTOMY/INFUSION INTRACRANIAL INC DIAG ANGIO  05/12/2020      . TEE WITHOUT CARDIOVERSION N/A 07/31/2019   Procedure: TRANSESOPHAGEAL ECHOCARDIOGRAM (TEE);  Surgeon: Pixie Casino, MD;  Location: [facility] ENDOSCOPY;  Service: Cardiovascular;  Laterality: N/A;     Social History:   reports that she has never smoked. She has never used smokeless tobacco. She reports that she does not drink alcohol and does not use drugs.   Family History:  Her family  history includes Alcohol abuse in an other family member; Diabetes in her sister, sister and another family member; Hypertension in her sister; Multiple sclerosis in her sister; Other in her father; Stroke in her mother. There is no history of Breast cancer.   Allergies Allergies  Allergen Reactions  . Fruit & Vegetable Daily [Nutritional Supplements] Swelling and Other (See Comments)  All fruits cause tongue swelling and numbness Organic fruits are okay to eat  . Other Swelling    Tree nuts cause tongue swelling and numbness  . Peanut-Containing Drug Products Swelling and Other (See Comments)    Tongue swelling and numbness  . Aspirin Nausea And Vomiting and Other (See Comments)    Cannot take due to liver issues Other reaction(s): Unknown  . Tylenol [Acetaminophen] Other (See Comments)    Cannot take due to liver issues     Home Medications  Prior to Admission medications   Medication Sig Start Date End Date Taking? Authorizing Provider  amiodarone (PACERONE) 200 MG tablet Take 1 tablet (200 mg total) by mouth 2 (two) times daily for 12 days. 02/07/20 02/19/20  Doristine Mango L, DO  amiodarone (PACERONE) 200 MG tablet Take 1 tablet (200 mg total) by mouth daily. 02/21/20   Anderson, Chelsey L, DO  amLODipine (NORVASC) 2.5 MG tablet Take 2.5 mg by mouth daily. 02/09/20   [provider]  B-D UF III MINI PEN NEEDLES 31G X 5 MM MISC USE WITH LANTUS PEN DAILY AS DIRECTED 02/11/20   Carollee Leitz, MD  Blood Pressure KIT Monitor blood pressure twice a day 09/28/19   Carollee Leitz, MD  ELIQUIS 5 MG TABS tablet TAKE 1 TABLET(5 MG) BY MOUTH TWICE DAILY 04/10/20   Tobb, Kardie, DO  fluticasone (FLONASE) 50 MCG/ACT nasal spray Place 2 sprays into both nostrils daily. 02/09/20   [provider]  furosemide (LASIX) 40 MG tablet TAKE 1 TABLET(40 MG) BY MOUTH TWICE DAILY 03/03/20   Carollee Leitz, MD  glucose blood (ACCU-CHEK AVIVA) test strip Use as instructed 06/16/17   Zenia Resides,  MD  insulin glargine (LANTUS) 100 UNIT/ML Solostar Pen Inject 18 Units into the skin daily. 02/07/20   Anderson, Chelsey L, DO  Lancets (ACCU-CHEK SOFT TOUCH) lancets Once daily testing. 06/16/17   Zenia Resides, MD  Magnesium Oxide 400 MG CAPS Take 1 capsule (400 mg total) by mouth daily. 08/17/19   Richardson Dopp T, PA-C  metolazone (ZAROXOLYN) 5 MG tablet Take 30 minutes before Lasix for 7 days 03/18/20   Tobb, Kardie, DO  metoprolol succinate (TOPROL-XL) 100 MG 24 hr tablet Take 1 tablet (100 mg total) by mouth 2 (two) times daily. ** DO NOT CRUSH **  (BETA BLOCKER) 02/07/20 03/08/20  Anderson, Chelsey L, DO  Semaglutide,0.25 or 0.5MG /DOS, (OZEMPIC, 0.25 OR 0.5 MG/DOSE,) 2 MG/1.5ML SOPN Inject 0.5 mg into the skin once a week. Patient taking differently: Inject 0.5 mg into the skin every Monday. 11/14/19   Leavy Cella, RPH-CPP     Critical care time: 35 minutes      JD Rexene Agent [facility] 05/12/2020, 3:04 PM  Please see [email or web address] for pager details.  From 7A-7P if no response, please call [phone]. After hours, please call ELink [phone].

## 2020-05-12 NOTE — Consult Note (Signed)
Attending:    Subjective: 70 y/o female with multiple medical problems at baseline presented today with slurred speech and left sided weakness.  Noted to have findings worrisome for an acute R internal carotid occlusion, was given TPA and taken to IR.  In IR she was intubated, sedated and placed on mechanical ventilation then underwent successful mechanical thrombectomy of the clot with contact aspiration and complete revascularization.  The sheath was removed.  The after completion of the procedure her blood pressure dropped and she went into a PEA arrest.  She received epi x2.  PCCM contacted for admission.  At this point the patient was on multiple vasopressors for shock.  There were no noticeable complications from the procedure.  Of note, in January the patient was noted to have worsening systolic heart failure (biventricular).  She has atrial fibrillation and was on apixiban, but stopped this a week ago for unclear reasons.  Past Medical History:  Diagnosis Date  . Abdominal pain   . Back pain   . Chronic combined systolic and diastolic CHF (congestive heart failure) (H[facility]) 02/04/2020  . Constipation   . Diabetes mellitus without complication ([facility])   . Fatigue   . Full dentures   . Hemorrhoids   . Hypertension   . Lack of adequate sleep   . Liver mass   . Rash    on right breast  . Wears glasses      Objective: Vitals:   05/12/20 1200 05/12/20 1300 05/12/20 1309  BP:  (!) 163/104   Pulse:  (!) 147   Resp:  (!) 29   Temp:   98.3 F (36.8 C)  TempSrc:   Oral  SpO2:  98%   Weight: 89.8 kg        Intake/Output Summary (Last 24 hours) at 05/12/2020 1539 Last data filed at 05/12/2020 1441 Gross per 24 hour  Intake 350 ml  Output --  Net 350 ml    General:  In bed on vent HENT: NCAT ETT in place PULM: Rhonchi bilaterally, vent supported breathing CV: RRR, no mgr GI: BS+, soft, nontender MSK: normal bulk and tone Neuro: sedated on vent   CBC    Component Value  Date/Time   WBC 6.2 05/12/2020 1220   RBC 5.38 (H) 05/12/2020 1220   HGB 15.3 (H) 05/12/2020 1228   HGB 14.0 03/03/2020 1559   HCT 45.0 05/12/2020 1228   HCT 45.6 03/03/2020 1559   PLT 242 05/12/2020 1220   PLT 281 03/03/2020 1559   MCV 79.6 (L) 05/12/2020 1220   MCV 82 03/03/2020 1559   MCH 25.1 (L) 05/12/2020 1220   MCHC 31.5 05/12/2020 1220   RDW 15.5 05/12/2020 1220   RDW 13.9 03/03/2020 1559   LYMPHSABS 2.0 05/12/2020 1220   LYMPHSABS 2.4 03/03/2020 1559   MONOABS 0.6 05/12/2020 1220   EOSABS 0.1 05/12/2020 1220   EOSABS 0.1 03/03/2020 1559   BASOSABS 0.0 05/12/2020 1220   BASOSABS 0.0 03/03/2020 1559    BMET    Component Value Date/Time   NA 137 05/12/2020 1228   NA 142 03/18/2020 1645   K 3.4 (L) 05/12/2020 1228   CL 101 05/12/2020 1228   CO2 22 05/12/2020 1220   GLUCOSE 253 (H) 05/12/2020 1228   BUN 17 05/12/2020 1228   BUN 16 03/18/2020 1645   CREATININE 0.90 05/12/2020 1228   CALCIUM 8.8 (L) 05/12/2020 1220   GFRNONAA 55 (L) 05/12/2020 1220   GFRAA 61 03/05/2020 1524    CXR images  pending  Impression/Plan: Acute respiratory failure with hypoxemia > due to inability to protect airway in setting of cardiogenic shock and stroke: plan full vent support, daily SBT/WUA, VAP prevention, check ABG, check CXR Acute ischemic stroke s/p TPA and mechanical thrombectomy, most likely embolic as she is off Eliquis. > post thrombolysis, post stroke secondary prevention all per neuro and IR; will keep goal SBP 120-140 Cardiogenic shock> suspect cardiac arrest due to having a weak heart and stress from the stroke/arrest; check coox, cvp, trop, repeat 12 lead, check lactic acid; wean off vasopressors.  If remains on pressor support will contact heart failure team Atrial fib> tele, continue amiodarone for now, hold anticoagulation until OK by neuro DM2> SSI, q4h  My cc time 40 minutes  Heber Gunnison, MD [place] [facility] Pager: [phone] Cell: [phone] After 3pm or if no  response, call [phone]

## 2020-05-12 NOTE — Anesthesia Procedure Notes (Signed)
Procedure Name: Intubation Date/Time: 05/12/2020 1:20 PM Performed by: Adria Dill, CRNA Pre-anesthesia Checklist: Patient identified, Emergency Drugs available, Suction available and Patient being monitored Patient Re-evaluated:Patient Re-evaluated prior to induction Oxygen Delivery Method: Circle system utilized Preoxygenation: Pre-oxygenation with 100% oxygen Induction Type: IV induction, Rapid sequence and Cricoid Pressure applied Laryngoscope Size: Miller and 3 Grade View: Grade I Tube type: Oral Tube size: 7.5 mm Number of attempts: 1 Airway Equipment and Method: Stylet and Oral airway Placement Confirmation: ETT inserted through vocal cords under direct vision,  positive ETCO2 and breath sounds checked- equal and bilateral Secured at: 21 cm Tube secured with: Tape Dental Injury: Teeth and Oropharynx as per pre-operative assessment

## 2020-05-12 NOTE — Sedation Documentation (Addendum)
Pt transported to 4N32 via stretcher accompanied by RN, RT and CRNAs. Consuella Lose RN at bedside to receive pt. Handoff completed. Right groin site remains level 0.  Bilateral distal lower extremity pulses present via doppler. No s/s of distress at this time.

## 2020-05-12 NOTE — Transfer of Care (Signed)
Immediate Anesthesia Transfer of Care Note  Patient: [name] [name]  Procedure(s) Performed: IR WITH ANESTHESIA (N/A )  Patient Location: ICU  Anesthesia Type:General  Level of Consciousness: Patient remains intubated per anesthesia plan  Airway & Oxygen Therapy: Patient remains intubated per anesthesia plan and Patient placed on Ventilator (see vital sign flow sheet for setting)  Post-op Assessment: Report given to RN  Post vital signs: Reviewed and stable  Last Vitals:  Vitals Value Taken Time  BP 152/112 05/12/20 1527  Temp    Pulse    Resp 22 05/12/20 1532  SpO2    Vitals shown include unvalidated device data.  Last Pain:  Vitals:   05/12/20 1309  TempSrc: Oral         Complications: No complications documented.

## 2020-05-12 NOTE — Progress Notes (Signed)
Family, daughter, Mariel Craft updated by RN and CCM and cardiology MDs at bedside.

## 2020-05-12 NOTE — H&P (Addendum)
Admission H&P    Chief Complaint: Acute onset of left facial droop and left sided weakness.   HPI: [name] [name] is an 70 y.o. female with a PMHx of paroxysmal atrial fibrillation, PE, HTN, DM, combined systolic and diastolic CHF, liver mass and breast ductal system excision presenting with acute left sided weakness, left facial droop and garbled speech. She has been on Eliquis for her a-fib as well as for prior history of PE, but stopped this one week ago without consulting her physician due to what she felt were allergic-type side effects of the medication.   She was LKN at 10 AM today when she spoke with family on Facetime. She then spoke with family again at 59 and family noted that she had a left sided facial droop and difficulty speaking. Family called 911 and patient was brought to the ED emergently. Per EMS, she had left sided deficits on arrival to her home. She also was having difficulty ambulating. Initial BP per EMS was 244/124. CBG was 224.   She is fully independent at baseline. No prior history of stroke.   LSN: 10 AM tPA Given: Yes mRS: 0  Past Medical History:  Diagnosis Date  . Abdominal pain   . Back pain   . Chronic combined systolic and diastolic CHF (congestive heart failure) (HCC) 02/04/2020  . Constipation   . Diabetes mellitus without complication (HCC)   . Fatigue   . Full dentures   . Hemorrhoids   . Hypertension   . Lack of adequate sleep   . Liver mass   . Rash    on right breast  . Wears glasses     Past Surgical History:  Procedure Laterality Date  . BREAST DUCTAL SYSTEM EXCISION    . BREAST EXCISIONAL BIOPSY Left   . CARDIOVERSION N/A 07/31/2019   Procedure: CARDIOVERSION;  Surgeon: Chrystie Nose, MD;  Location: [facility] ENDOSCOPY;  Service: Cardiovascular;  Laterality: N/A;  . CHOLECYSTECTOMY    . CHOLECYSTECTOMY, LAPAROSCOPIC  07/24/2010  . ESOPHAGEAL DILATION    . TEE WITHOUT CARDIOVERSION N/A 07/31/2019   Procedure: TRANSESOPHAGEAL  ECHOCARDIOGRAM (TEE);  Surgeon: Chrystie Nose, MD;  Location: [facility] ENDOSCOPY;  Service: Cardiovascular;  Laterality: N/A;    Family History  Problem Relation Age of Onset  . Stroke Mother   . Other Father        broken heart after spouse spouse  . Diabetes Sister   . Hypertension Sister   . Diabetes Sister   . Multiple sclerosis Sister   . Alcohol abuse Other   . Diabetes Other   . Breast cancer Neg Hx    Social History:  reports that she has never smoked. She has never used smokeless tobacco. She reports that she does not drink alcohol and does not use drugs.  Allergies:  Allergies  Allergen Reactions  . Fruit & Vegetable Daily [Nutritional Supplements] Swelling and Other (See Comments)    All fruits cause tongue swelling and numbness Organic fruits are okay to eat  . Other Swelling    Tree nuts cause tongue swelling and numbness  . Peanut-Containing Drug Products Swelling and Other (See Comments)    Tongue swelling and numbness  . Aspirin Nausea And Vomiting and Other (See Comments)    Cannot take due to liver issues Other reaction(s): Unknown  . Tylenol [Acetaminophen] Other (See Comments)    Cannot take due to liver issues    (Not in a hospital admission)   ROS: Had severe  abdominal pain this AM that has since gotten significantly better. Other symptoms as per HPI. Detailed ROS deferred due to acuity of presentation.   Physical Examination: Blood pressure (!) 163/104, pulse (!) 147, temperature 98.3 F (36.8 C), temperature source Oral, resp. rate (!) 29, weight 89.8 kg, SpO2 98 %.  HEENT-  Bryan/AT. Severe female pattern balding noted.  Lungs - Respirations unlabored Extremities - Pitting edema to BLE.   Neurologic Examination:(inidial exam in CT) Mental Status: Alert and oriented, with fair attention.  Speech fluent with intact comprehension and naming. Mild dysarthria.  Cranial Nerves: II:  Visual fields intact, but with extinction on the left to DSS. PERRL.   III,IV, VI: Left palpebral fissure larger than on the right. EOMI with saccadic quality on visual pursuits, but full. V,VII: Left facial droop, lower quadrant. Able to furrow brow normally bilaterally. Temp sensation equal bilaterally. VIII: Hearing intact to voice IX,X: No hypophonia XI: Head midline during initial exam XII: midline tongue extension  Motor: RUE 5/5 RLE 5/5 LUE 3-4/5 LLE 3-4/5 Sensory: Temp and FT intact BUE and BLE. Positive for extinction on the left.  Deep Tendon Reflexes:  2+ bilateral brachioradialis, biceps and patellae.  Plantars: Right: downgoing  Left: less briskly downgoing Cerebellar: No ataxia with FNF disproportionate to weakness.  Gait: Deferred  Repeat exam after CTA shows deterioration with left hemineglect, anosognosia, drowsiness, and decreased attention.   Results for orders placed or performed during the hospital encounter of 05/12/20 (from the past 48 hour(s))  Protime-INR     Status: Abnormal   Collection Time: 05/12/20 12:20 PM  Result Value Ref Range   Prothrombin Time 16.5 (H) 11.4 - 15.2 seconds   INR 1.3 (H) 0.8 - 1.2    Comment: (NOTE) INR goal varies based on device and disease states. Performed at [facility], [address] [place], [place] [address]   APTT     Status: None   Collection Time: 05/12/20 12:20 PM  Result Value Ref Range   aPTT 26 24 - 36 seconds    Comment: Performed at [facility], [address] [place], [place] [address]  CBC     Status: Abnormal   Collection Time: 05/12/20 12:20 PM  Result Value Ref Range   WBC 6.2 4.0 - 10.5 K/uL   RBC 5.38 (H) 3.87 - 5.11 MIL/uL   Hemoglobin 13.5 12.0 - 15.0 g/dL   HCT 09.842.8 11.936.0 - 14.746.0 %   MCV 79.6 (L) 80.0 - 100.0 fL   MCH 25.1 (L) 26.0 - 34.0 pg   MCHC 31.5 30.0 - 36.0 g/dL   RDW 82.915.5 56.211.5 - 13.015.5 %   Platelets 242 150 - 400 K/uL   nRBC 0.0 0.0 - 0.2 %    Comment: Performed at [facility], [address] [place], [place] [address]   Differential     Status: None   Collection Time: 05/12/20 12:20 PM  Result Value Ref Range   Neutrophils Relative % 55 %   Neutro Abs 3.4 1.7 - 7.7 K/uL   Lymphocytes Relative 33 %   Lymphs Abs 2.0 0.7 - 4.0 K/uL   Monocytes Relative 10 %   Monocytes Absolute 0.6 0.1 - 1.0 K/uL   Eosinophils Relative 1 %   Eosinophils Absolute 0.1 0.0 - 0.5 K/uL   Basophils Relative 1 %   Basophils Absolute 0.0 0.0 - 0.1 K/uL   Immature Granulocytes 0 %   Abs Immature Granulocytes 0.02 0.00 -  0.07 K/uL    Comment: Performed at [facility], [address] [place], [place] [address]  Comprehensive metabolic panel     Status: Abnormal   Collection Time: 05/12/20 12:20 PM  Result Value Ref Range   Sodium 135 135 - 145 mmol/L   Potassium 3.4 (L) 3.5 - 5.1 mmol/L   Chloride 100 98 - 111 mmol/L   CO2 22 22 - 32 mmol/L   Glucose, Bld 257 (H) 70 - 99 mg/dL    Comment: Glucose reference range applies only to samples taken after fasting for at least 8 hours.   BUN 15 8 - 23 mg/dL   Creatinine, Ser 1.94 (H) 0.44 - 1.00 mg/dL   Calcium 8.8 (L) 8.9 - 10.3 mg/dL   Total Protein 6.4 (L) 6.5 - 8.1 g/dL   Albumin 2.6 (L) 3.5 - 5.0 g/dL   AST 47 (H) 15 - 41 U/L   ALT 26 0 - 44 U/L   Alkaline Phosphatase 106 38 - 126 U/L   Total Bilirubin 1.0 0.3 - 1.2 mg/dL   GFR, Estimated 55 (L) >60 mL/min    Comment: (NOTE) Calculated using the CKD-EPI Creatinine Equation (2021)    Anion gap 13 5 - 15    Comment: Performed at [facility], [address], [place], [place] [address]  CBG monitoring, ED     Status: Abnormal   Collection Time: 05/12/20 12:21 PM  Result Value Ref Range   Glucose-Capillary 242 (H) 70 - 99 mg/dL    Comment: Glucose reference range applies only to samples taken after fasting for at least 8 hours.  I-stat chem 8, ED     Status: Abnormal   Collection Time: 05/12/20 12:28 PM  Result Value Ref Range   Sodium 137 135 - 145 mmol/L   Potassium 3.4 (L) 3.5 - 5.1 mmol/L   Chloride  101 98 - 111 mmol/L   BUN 17 8 - 23 mg/dL   Creatinine, Ser 1.44 0.44 - 1.00 mg/dL   Glucose, Bld 818 (H) 70 - 99 mg/dL    Comment: Glucose reference range applies only to samples taken after fasting for at least 8 hours.   Calcium, Ion 1.02 (L) 1.15 - 1.40 mmol/L   TCO2 22 22 - 32 mmol/L   Hemoglobin 15.3 (H) 12.0 - 15.0 g/dL   HCT 56.3 14.9 - 70.2 %   CT ANGIO NECK W OR WO CONTRAST  Result Date: 05/12/2020 CLINICAL DATA:  Left-sided weakness EXAM: CT ANGIOGRAPHY HEAD AND NECK TECHNIQUE: Multidetector CT imaging of the head and neck was performed using the standard protocol during bolus administration of intravenous contrast. Multiplanar CT image reconstructions and MIPs were obtained to evaluate the vascular anatomy. Carotid stenosis measurements (when applicable) are obtained utilizing NASCET criteria, using the distal internal carotid diameter as the denominator. CONTRAST:  59mL OMNIPAQUE IOHEXOL 350 MG/ML SOLN COMPARISON:  Noncontrast head CT performed earlier today 05/12/2020. FINDINGS: CTA NECK FINDINGS Aortic arch: Standard aortic branching. No hemodynamically significant innominate or proximal subclavian artery stenosis. Right carotid system: CCA and ICA patent within the neck without significant stenosis (50% or greater). Mild soft and calcified plaque within the carotid bifurcation and proximal ICA. Asymmetrically diminished enhancement of the cervical right ICA, likely due to downstream occlusion. Left carotid system: CCA and ICA patent within the neck without significant stenosis (50% or greater). Mild soft and calcified plaque within the carotid bifurcation and proximal ICA. Vertebral arteries: Patent within the neck without stenosis. Duplicated  proximal left vertebral artery. Skeleton: No acute bony abnormality or aggressive osseous lesion. Cervical spondylosis. Other neck: Asymmetric prominence of the right thyroid lobe. Underlying right thyroid lobe nodule measuring at least 2.8 cm. Upper  chest: Partially imaged left pleural effusion. Review of the MIP images confirms the above findings CTA HEAD FINDINGS Anterior circulation: Asymmetrically diminished enhancement and caliber of the right ICA siphon, likely secondary to downstream occlusion. Absent enhancement within the right ICA terminus, proximal M1 right MCA and proximal A1 right ACA. Findings are compatible with T-shaped occlusion secondary to thrombus. There is some reconstitution of enhancement more distally within the M1 right MCA and within right M2 vessels. There is reconstitution of enhancement within the distal A1 right ACA. The intracranial left ICA is patent. No left M2 proximal branch occlusion or high-grade proximal stenosis is identified. Developmentally hypoplastic A1 left ACA. The A2 and more distal anterior cerebral arteries are patent. No intracranial aneurysm is identified. Posterior circulation: The intracranial vertebral arteries are patent. The basilar artery is patent. The posterior cerebral arteries are patent. A right posterior communicating artery is present. The left posterior communicating artery is hypoplastic or absent. Venous sinuses: Within the limitations of contrast timing, no convincing thrombus. Anatomic variants: As described Review of the MIP images confirms the above findings Critical/emergent results were called by telephone at the time of interpretation on 05/12/2020 at 12:53 pm to provider Jaculin [facility] , who verbally acknowledged these results. IMPRESSION: CTA neck: 1. The common carotid, internal carotid and vertebral arteries are patent within the neck without hemodynamically significant stenosis. Mild atherosclerotic disease within the carotid bifurcations and proximal ICAs. 2. Asymmetrically diminished enhancement of the cervical right ICA, likely secondary to downstream occlusion. 3. 2.8 cm right thyroid lobe nodule. Non-emergent thyroid ultrasound is recommended for further evaluation. CTA head:  T-shaped large vessel occlusion involving the right ICA terminus, proximal M1 right middle cerebral artery and proximal A1 right anterior cerebral artery. Electronically Signed   By: Jackey Loge DO   On: 05/12/2020 13:13   CT HEAD CODE STROKE WO CONTRAST  Result Date: 05/12/2020 CLINICAL DATA:  Code stroke. Neuro deficit, acute, stroke suspected. Additional history provided: Left-sided weakness. EXAM: CT HEAD WITHOUT CONTRAST TECHNIQUE: Contiguous axial images were obtained from the base of the skull through the vertex without intravenous contrast. COMPARISON:  Prior head CT examinations 05/10/2010. FINDINGS: Brain: Cerebral volume is normal. There is no acute intracranial hemorrhage. No demarcated cortical infarct. No extra-axial fluid collection. No evidence of intracranial mass. No midline shift. Vascular: No hyperdense vessel.  Atherosclerotic calcifications Skull: Normal. Negative for fracture or focal lesion. Sinuses/Orbits: Redemonstrated bilateral proptosis. Rightward gaze. No significant paranasal sinus disease. ASPECTS [facility] Stroke Program Early CT Score) - Ganglionic level infarction (caudate, lentiform nuclei, internal capsule, insula, M1-M3 cortex): 7 - Supraganglionic infarction (M4-M6 cortex): 3 Total score (0-10 with 10 being normal): 10 These results were communicated to Dr. Otelia Limes at 12:40 pmon 5/2/2022by text page via the [facility] messaging system. IMPRESSION: No evidence of acute intracranial abnormality.  ASPECTS is 10. Redemonstrated bilateral proptosis. Electronically Signed   By: Jackey Loge DO   On: 05/12/2020 12:41   CT ANGIO HEAD CODE STROKE  Result Date: 05/12/2020 CLINICAL DATA:  Left-sided weakness EXAM: CT ANGIOGRAPHY HEAD AND NECK TECHNIQUE: Multidetector CT imaging of the head and neck was performed using the standard protocol during bolus administration of intravenous contrast. Multiplanar CT image reconstructions and MIPs were obtained to evaluate the vascular anatomy.  Carotid stenosis measurements (when applicable)  are obtained utilizing NASCET criteria, using the distal internal carotid diameter as the denominator. CONTRAST:  34mL OMNIPAQUE IOHEXOL 350 MG/ML SOLN COMPARISON:  Noncontrast head CT performed earlier today 05/12/2020. FINDINGS: CTA NECK FINDINGS Aortic arch: Standard aortic branching. No hemodynamically significant innominate or proximal subclavian artery stenosis. Right carotid system: CCA and ICA patent within the neck without significant stenosis (50% or greater). Mild soft and calcified plaque within the carotid bifurcation and proximal ICA. Asymmetrically diminished enhancement of the cervical right ICA, likely due to downstream occlusion. Left carotid system: CCA and ICA patent within the neck without significant stenosis (50% or greater). Mild soft and calcified plaque within the carotid bifurcation and proximal ICA. Vertebral arteries: Patent within the neck without stenosis. Duplicated proximal left vertebral artery. Skeleton: No acute bony abnormality or aggressive osseous lesion. Cervical spondylosis. Other neck: Asymmetric prominence of the right thyroid lobe. Underlying right thyroid lobe nodule measuring at least 2.8 cm. Upper chest: Partially imaged left pleural effusion. Review of the MIP images confirms the above findings CTA HEAD FINDINGS Anterior circulation: Asymmetrically diminished enhancement and caliber of the right ICA siphon, likely secondary to downstream occlusion. Absent enhancement within the right ICA terminus, proximal M1 right MCA and proximal A1 right ACA. Findings are compatible with T-shaped occlusion secondary to thrombus. There is some reconstitution of enhancement more distally within the M1 right MCA and within right M2 vessels. There is reconstitution of enhancement within the distal A1 right ACA. The intracranial left ICA is patent. No left M2 proximal branch occlusion or high-grade proximal stenosis is identified.  Developmentally hypoplastic A1 left ACA. The A2 and more distal anterior cerebral arteries are patent. No intracranial aneurysm is identified. Posterior circulation: The intracranial vertebral arteries are patent. The basilar artery is patent. The posterior cerebral arteries are patent. A right posterior communicating artery is present. The left posterior communicating artery is hypoplastic or absent. Venous sinuses: Within the limitations of contrast timing, no convincing thrombus. Anatomic variants: As described Review of the MIP images confirms the above findings Critical/emergent results were called by telephone at the time of interpretation on 05/12/2020 at 12:53 pm to provider Mckena Chern [facility] , who verbally acknowledged these results. IMPRESSION: CTA neck: 1. The common carotid, internal carotid and vertebral arteries are patent within the neck without hemodynamically significant stenosis. Mild atherosclerotic disease within the carotid bifurcations and proximal ICAs. 2. Asymmetrically diminished enhancement of the cervical right ICA, likely secondary to downstream occlusion. 3. 2.8 cm right thyroid lobe nodule. Non-emergent thyroid ultrasound is recommended for further evaluation. CTA head: T-shaped large vessel occlusion involving the right ICA terminus, proximal M1 right middle cerebral artery and proximal A1 right anterior cerebral artery. Electronically Signed   By: Jackey Loge DO   On: 05/12/2020 13:13    Assessment: 70 y.o. female presenting with acute onset of left sided deficits.  1. Exam findings are most consistent with an acute right cerebral hemisphere lesion.  2. CT head without acute abnormality. ASPECTS 10.  3. CTA neck: The common carotid, internal carotid and vertebral arteries are patent within the neck without hemodynamically significant stenosis. Mild atherosclerotic disease within the carotid bifurcations and proximal ICAs. Asymmetrically diminished enhancement of the cervical right  ICA, likely secondary to downstream occlusion. 4. CTA head: T-shaped large vessel occlusion involving the right ICA terminus, proximal M1 right middle cerebral artery and proximal A1 right anterior cerebral artery.  5. Stroke Risk Factors - Paroxysmal atrial fibrillation, noncompliance with anticoagulation, history of PE, combined systolic and diastolic  CHF, DM and HTN.  6. After comprehensive review of possible contraindications, she has no absolute contraindications to tPA administration. 7. Patient is a tPA candidate. Overall benefits of tPA regarding long-term prognosis are felt to outweigh risks. Discussed extensively the risks/benefits of tPA treatment vs. no treatment with the patient and her daughter, including risks of hemorrhage and death with tPA administration versus worse overall outcomes on average in patients within tPA time window who are not administered tPA. Repeat exam after CTA showed deterioration with left hemineglect, anosognosia, drowsiness, and decreased attention. Patient at that time determined to be unable to provide informed consent for procedure. Discussed the patient's condition over the telephone with her daughter who provided informed consent for both tPA and endovascular thrombectomy, which was witnessed by Donny Pique, RN.  8. The patient is a candidate for endovascular thrombectomy. Discussed extensively the risks/benefits of thrombectomy treatment vs. no treatment with the patient's daughter, including approximate 10% risk of SAH with thrombectomy, which carries significant chance for death or long morbidity, versus worse overall outcomes on average in patients within thrombectomy 24 hour time window who do not undergo the procedure. Overall benefits of thrombectomy regarding long-term prognosis are felt to outweigh risks. The patient's daughter expressed understanding and wish to proceed with thrombectomy.  9. Has a listed allergy to ASA.   Plan: 1. Admitting to Neuro  ICU following tPA and thrombectomy  2. Post-tPA and VIR orders to include frequent neuro checks and BP management.  3. No antiplatelet medications or anticoagulants for at least 24 hours following tPA.  4. DVT prophylaxis with SCDs.  5. Will need to be started on a statin.  6. Will need to be restarted on oral anticoagulation if follow up CT at 24 hours is negative for hemorrhagic conversion. 7. SSI  8. TTE.  9. MRI brain  10. PT/OT/Speech.  11. NPO until passes swallow evaluation.  12. Telemetry monitoring 13. Fasting lipid panel, HgbA1c 14. Cardiology consult for CHF and atrial fibrillation.  15. CCM has been consulted for vent management 16. Thyroid nodule also seen on CTA. Thyroid ultrasound has been ordered.   Addendum: - Coded in IR following thrombectomy and was successfully resuscitated - Discussed case with CCM - Cardiology is seeing the patient as well  80 minutes spent in the emergent neurological evaluation and management of this critically ill patient  Electronically signed: Dr. Caryl Pina 05/12/2020, 1:47 PM

## 2020-05-12 NOTE — Progress Notes (Signed)
Pt arrived from IR, vaso, neo, amio, and epi gtts infusing. No O2 sat reading, tachycardic, normotensive. CCM at bedside, guiding care.

## 2020-05-12 NOTE — Anesthesia Procedure Notes (Signed)
Arterial Line Insertion Start/End5/02/2020 1:40 PM, 05/12/2020 1:46 PM Performed by: anesthesiologist  Preanesthetic checklist: patient identified, IV checked, site marked, risks and benefits discussed, surgical consent, monitors and equipment checked, pre-op evaluation, timeout performed and anesthesia consent Emergency situation Left, radial was placed Catheter size: 20 G Hand hygiene performed , maximum sterile barriers used  and Seldinger technique used Allen's test indicative of satisfactory collateral circulation Attempts: 2 Procedure performed using ultrasound guided technique. Ultrasound Notes:anatomy identified, needle tip was noted to be adjacent to the nerve/plexus identified and no ultrasound evidence of intravascular and/or intraneural injection Following insertion, dressing applied and Biopatch. Post procedure assessment: normal  Patient tolerated the procedure well with no immediate complications.

## 2020-05-12 NOTE — ED Triage Notes (Signed)
Per ems: pt on facetime with family at 10:00am. Normal. facetime again at 11:30 and family noticed facial droop and slurred speech. Called EMS. Pt states she recently stopped her blood thinners

## 2020-05-12 NOTE — Progress Notes (Signed)
PHARMACIST CODE STROKE RESPONSE  Notified to mix tPA at 5/2@1308  by Dr. Otelia Limes Delivered tPA to RN at 1311  tPA dose = 8.1 mg bolus over 1 minute followed by 72.7 mg for a total dose of 80.8 mg over 1 hour  Issues/delays encountered (if applicable): Pt has been off apixaban for one week per report. Pt initially refused tPA but family and two physicians deemed okay to proceed (risk vs benefit).   Sherron Monday, PharmD, BCCCP Clinical Pharmacist  Phone: [phone] 05/12/2020 1:15 PM  Please check AMION for all [facility] Pharmacy phone numbers After 10:00 PM, call Main Pharmacy [phone]

## 2020-05-13 ENCOUNTER — Inpatient Hospital Stay (HOSPITAL_COMMUNITY): Payer: Medicare Other

## 2020-05-13 ENCOUNTER — Encounter (HOSPITAL_COMMUNITY): Payer: Self-pay | Admitting: Radiology

## 2020-05-13 DIAGNOSIS — Z7189 Other specified counseling: Secondary | ICD-10-CM | POA: Diagnosis not present

## 2020-05-13 DIAGNOSIS — I4819 Other persistent atrial fibrillation: Secondary | ICD-10-CM

## 2020-05-13 DIAGNOSIS — J9601 Acute respiratory failure with hypoxia: Secondary | ICD-10-CM

## 2020-05-13 DIAGNOSIS — Z515 Encounter for palliative care: Secondary | ICD-10-CM | POA: Diagnosis not present

## 2020-05-13 DIAGNOSIS — I63231 Cerebral infarction due to unspecified occlusion or stenosis of right carotid arteries: Secondary | ICD-10-CM | POA: Diagnosis not present

## 2020-05-13 DIAGNOSIS — I469 Cardiac arrest, cause unspecified: Secondary | ICD-10-CM | POA: Diagnosis not present

## 2020-05-13 DIAGNOSIS — I639 Cerebral infarction, unspecified: Secondary | ICD-10-CM | POA: Diagnosis not present

## 2020-05-13 DIAGNOSIS — R570 Cardiogenic shock: Secondary | ICD-10-CM

## 2020-05-13 LAB — COMPREHENSIVE METABOLIC PANEL
ALT: 77 U/L — ABNORMAL HIGH (ref 0–44)
AST: 141 U/L — ABNORMAL HIGH (ref 15–41)
Albumin: 2.1 g/dL — ABNORMAL LOW (ref 3.5–5.0)
Alkaline Phosphatase: 109 U/L (ref 38–126)
Anion gap: 10 (ref 5–15)
BUN: 20 mg/dL (ref 8–23)
CO2: 22 mmol/L (ref 22–32)
Calcium: 8.5 mg/dL — ABNORMAL LOW (ref 8.9–10.3)
Chloride: 102 mmol/L (ref 98–111)
Creatinine, Ser: 1.19 mg/dL — ABNORMAL HIGH (ref 0.44–1.00)
GFR, Estimated: 50 mL/min — ABNORMAL LOW (ref >60)
Glucose, Bld: 166 mg/dL — ABNORMAL HIGH (ref 70–99)
Potassium: 3.5 mmol/L (ref 3.5–5.1)
Sodium: 134 mmol/L — ABNORMAL LOW (ref 135–145)
Total Bilirubin: 0.8 mg/dL (ref 0.3–1.2)
Total Protein: 5.3 g/dL — ABNORMAL LOW (ref 6.5–8.1)

## 2020-05-13 LAB — COOXEMETRY PANEL
Carboxyhemoglobin: 1 % (ref 0.5–1.5)
Carboxyhemoglobin: 1 % (ref 0.5–1.5)
Carboxyhemoglobin: 0.6 % (ref 0.5–1.5)
Methemoglobin: 0.6 % (ref 0.0–1.5)
Methemoglobin: 0.7 % (ref 0.0–1.5)
Methemoglobin: 0.7 % (ref 0.0–1.5)
O2 Saturation: 97 %
O2 Saturation: 97.4 %
O2 Saturation: 53.2 %
Total hemoglobin: 13.1 g/dL (ref 12.0–16.0)
Total hemoglobin: 13 g/dL (ref 12.0–16.0)
Total hemoglobin: 12.8 g/dL (ref 12.0–16.0)

## 2020-05-13 LAB — LIPID PANEL
Cholesterol: 87 mg/dL (ref 0–200)
HDL: 17 mg/dL — ABNORMAL LOW (ref >40)
LDL Cholesterol: 61 mg/dL (ref 0–99)
Total CHOL/HDL Ratio: 5.1 RATIO
Triglycerides: 46 mg/dL (ref <150)
VLDL: 9 mg/dL (ref 0–40)

## 2020-05-13 LAB — CBC
HCT: 36.8 % (ref 36.0–46.0)
Hemoglobin: 12.3 g/dL (ref 12.0–15.0)
MCH: 25.2 pg — ABNORMAL LOW (ref 26.0–34.0)
MCHC: 33.4 g/dL (ref 30.0–36.0)
MCV: 75.4 fL — ABNORMAL LOW (ref 80.0–100.0)
Platelets: 235 10*3/uL (ref 150–400)
RBC: 4.88 MIL/uL (ref 3.87–5.11)
RDW: 15 % (ref 11.5–15.5)
WBC: 13.1 10*3/uL — ABNORMAL HIGH (ref 4.0–10.5)
nRBC: 0 % (ref 0.0–0.2)

## 2020-05-13 LAB — HEMOGLOBIN A1C
Hgb A1c MFr Bld: 10.5 % — ABNORMAL HIGH (ref 4.8–5.6)
Mean Plasma Glucose: 254.65 mg/dL

## 2020-05-13 LAB — POCT I-STAT 7, (LYTES, BLD GAS, ICA,H+H)
Acid-base deficit: 1 mmol/L (ref 0.0–2.0)
Bicarbonate: 23.5 mmol/L (ref 20.0–28.0)
Calcium, Ion: 1.24 mmol/L (ref 1.15–1.40)
HCT: 40 % (ref 36.0–46.0)
Hemoglobin: 13.6 g/dL (ref 12.0–15.0)
O2 Saturation: 100 %
Patient temperature: 97.5
Potassium: 3.3 mmol/L — ABNORMAL LOW (ref 3.5–5.1)
Sodium: 136 mmol/L (ref 135–145)
TCO2: 25 mmol/L (ref 22–32)
pCO2 arterial: 37.6 mmHg (ref 32.0–48.0)
pH, Arterial: 7.401 (ref 7.350–7.450)
pO2, Arterial: 349 mmHg — ABNORMAL HIGH (ref 83.0–108.0)

## 2020-05-13 LAB — LACTIC ACID, PLASMA: Lactic Acid, Venous: 1.7 mmol/L (ref 0.5–1.9)

## 2020-05-13 LAB — GLUCOSE, CAPILLARY
Glucose-Capillary: 120 mg/dL — ABNORMAL HIGH (ref 70–99)
Glucose-Capillary: 131 mg/dL — ABNORMAL HIGH (ref 70–99)
Glucose-Capillary: 137 mg/dL — ABNORMAL HIGH (ref 70–99)
Glucose-Capillary: 174 mg/dL — ABNORMAL HIGH (ref 70–99)
Glucose-Capillary: 213 mg/dL — ABNORMAL HIGH (ref 70–99)
Glucose-Capillary: 263 mg/dL — ABNORMAL HIGH (ref 70–99)
Glucose-Capillary: 95 mg/dL (ref 70–99)

## 2020-05-13 LAB — TSH: TSH: 1.42 u[IU]/mL (ref 0.350–4.500)

## 2020-05-13 LAB — MAGNESIUM: Magnesium: 2.4 mg/dL (ref 1.7–2.4)

## 2020-05-13 LAB — PHOSPHORUS: Phosphorus: 4.5 mg/dL (ref 2.5–4.6)

## 2020-05-13 MED ORDER — ATORVASTATIN CALCIUM 80 MG PO TABS
80.0000 mg | ORAL_TABLET | Freq: Every day | ORAL | Status: DC
  Administered 2020-05-13 – 2020-05-26 (×14): 80 mg via ORAL
  Filled 2020-05-13 (×14): qty 1

## 2020-05-13 MED ORDER — AMIODARONE LOAD VIA INFUSION
150.0000 mg | Freq: Once | INTRAVENOUS | Status: AC
  Administered 2020-05-13: 150 mg via INTRAVENOUS
  Filled 2020-05-13: qty 83.34

## 2020-05-13 MED ORDER — AMIODARONE IV BOLUS ONLY 150 MG/100ML: 150.0000 mg | Freq: Once | INTRAVENOUS | Status: DC

## 2020-05-13 MED ORDER — LIVING WELL WITH DIABETES BOOK
Freq: Once | Status: AC
  Filled 2020-05-13: qty 1

## 2020-05-13 MED ORDER — FUROSEMIDE 10 MG/ML IJ SOLN
40.0000 mg | Freq: Once | INTRAMUSCULAR | Status: AC
  Administered 2020-05-13: 40 mg via INTRAVENOUS
  Filled 2020-05-13: qty 4

## 2020-05-13 MED ORDER — CHLORHEXIDINE GLUCONATE CLOTH 2 % EX PADS
6.0000 | MEDICATED_PAD | Freq: Every day | CUTANEOUS | Status: DC
  Administered 2020-05-13 – 2020-05-19 (×6): 6 via TOPICAL

## 2020-05-13 MED ORDER — MILRINONE LACTATE IN DEXTROSE 20-5 MG/100ML-% IV SOLN
0.1250 ug/kg/min | INTRAVENOUS | Status: DC
  Administered 2020-05-13 – 2020-05-15 (×2): 0.125 ug/kg/min via INTRAVENOUS
  Administered 2020-05-15 – 2020-05-17 (×4): 0.25 ug/kg/min via INTRAVENOUS
  Administered 2020-05-18: 0.125 ug/kg/min via INTRAVENOUS
  Filled 2020-05-13 (×7): qty 100

## 2020-05-13 MED ORDER — DIGOXIN 125 MCG PO TABS
0.1250 mg | ORAL_TABLET | Freq: Every day | ORAL | Status: DC
  Administered 2020-05-13 – 2020-05-26 (×14): 0.125 mg via ORAL
  Filled 2020-05-13 (×14): qty 1

## 2020-05-13 MED ORDER — FUROSEMIDE 10 MG/ML IJ SOLN
80.0000 mg | Freq: Two times a day (BID) | INTRAMUSCULAR | Status: DC
  Administered 2020-05-13 – 2020-05-15 (×4): 80 mg via INTRAVENOUS
  Filled 2020-05-13 (×5): qty 8

## 2020-05-13 MED ORDER — HEPARIN (PORCINE) 25000 UT/250ML-% IV SOLN
1050.0000 [IU]/h | INTRAVENOUS | Status: AC
  Administered 2020-05-13 – 2020-05-14 (×4): 1000 [IU]/h via INTRAVENOUS
  Administered 2020-05-15 – 2020-05-16 (×2): 1250 [IU]/h via INTRAVENOUS
  Administered 2020-05-17: 1100 [IU]/h via INTRAVENOUS
  Administered 2020-05-18 – 2020-05-20 (×3): 1050 [IU]/h via INTRAVENOUS
  Filled 2020-05-13 (×8): qty 250

## 2020-05-13 MED ORDER — OXYCODONE HCL 5 MG PO TABS
5.0000 mg | ORAL_TABLET | Freq: Four times a day (QID) | ORAL | Status: DC | PRN
  Administered 2020-05-13 – 2020-05-14 (×5): 5 mg via ORAL
  Filled 2020-05-13 (×5): qty 1

## 2020-05-13 MED ORDER — AMIODARONE IV BOLUS ONLY 150 MG/100ML
150.0000 mg | Freq: Once | INTRAVENOUS | Status: DC
  Filled 2020-05-13: qty 100

## 2020-05-13 MED ORDER — FUROSEMIDE 10 MG/ML IJ SOLN: 80.0000 mg | Freq: Two times a day (BID) | INTRAMUSCULAR | Status: DC

## 2020-05-13 MED ORDER — FENTANYL CITRATE (PF) 100 MCG/2ML IJ SOLN
12.5000 ug | Freq: Once | INTRAMUSCULAR | Status: AC
  Administered 2020-05-13: 12.5 ug via INTRAVENOUS
  Filled 2020-05-13: qty 2

## 2020-05-13 MED ORDER — SENNOSIDES-DOCUSATE SODIUM 8.6-50 MG PO TABS
1.0000 | ORAL_TABLET | Freq: Every evening | ORAL | Status: DC | PRN
  Administered 2020-05-19: 1 via ORAL
  Filled 2020-05-13: qty 1

## 2020-05-13 MED ORDER — NOREPINEPHRINE 16 MG/250ML-% IV SOLN
0.0000 ug/min | INTRAVENOUS | Status: DC
  Administered 2020-05-13: 3 ug/min via INTRAVENOUS
  Filled 2020-05-13: qty 250

## 2020-05-13 MED ORDER — POTASSIUM CHLORIDE 20 MEQ PO PACK
20.0000 meq | PACK | Freq: Every day | ORAL | Status: DC
  Administered 2020-05-13: 20 meq via ORAL
  Filled 2020-05-13: qty 1

## 2020-05-13 MED ORDER — AMIODARONE HCL IN DEXTROSE 360-4.14 MG/200ML-% IV SOLN
30.0000 mg/h | INTRAVENOUS | Status: DC
  Administered 2020-05-13 – 2020-05-17 (×14): 60 mg/h via INTRAVENOUS
  Administered 2020-05-17 – 2020-05-19 (×5): 30 mg/h via INTRAVENOUS
  Filled 2020-05-13 (×9): qty 200
  Filled 2020-05-13: qty 400
  Filled 2020-05-13 (×7): qty 200

## 2020-05-13 NOTE — Consult Note (Addendum)
Advanced Heart Failure Team Consult Note   Primary Physician: Dana Allan, MD PCP-Cardiologist:  Thomasene Ripple, DO  Reason for Consultation: Acute on chronic Combined Systolic and Diastolic Heart Failure/ Severe Biventricular Dysfunction   HPI:    [name] [name] is seen today for evaluation of acute on chronic combined systolic and diastolic heart failure/ severe biventricular dysfunction at the request of Dr. Mayford Knife, Cardiology.    70 y/o AAM w/ h/o PAF/ atrial flutter + SSS intolerant in the past to AVN blocking agents due to intermittent bradycardia w/ pauses, combined chronic diastolic and systolic heart failure felt to be tachy mediated, prior echo w/ LVEF 40-45% range, pulmonary hypertension, systemic HTN, HLD, DM, COVID 19 infection 1/22 and significant psychiatric disease w/ bipolar disorder and ? schizophrenia w/ subsequent long history of poor medical compliance.   She was recently admitted 1/22 w/ COVID and was back in AFL w/ RVR but apparently refused TEE/DCCV. 2D echo showed drop in LVEF, down to 25-30% w/ moderate RV dysfunction and moderate pulmonary HTN, RVSP 50 mmHg. Severe concentric LVH also noted. There was concern for possible infiltrative CM as well, however pt apparently refussed additional w/u including R/LHC and PYP scan. She was treated medically.   Now admitted for acute CVA in setting of poor compliance w/ Eliquis. On 5/2 she developed acute left sided facial drop and slurred speech at home. Taken to ER as CODE STROKE w/ imaging showing acute occlusion of the rt ICA terminus, proximal RMCA and proximal A1 rt anterior cerebral artery, treated w/ TPA followed by thrombectomy. Immediately post procedure, she became hypotensive and went into PEA arrest and she was successfully resuscitated w/ epi x 2 and started on vasopressin, neo and epi gtt. Required intubation. Also found to be in rapid atrial flutter and started on amio gtt.  STAT Echo was obtained showing severe  biventricular failure w/ LVEF now < 20% and RV severely reduced w/ mod-severe TR. Also w/ large circumferential pericardial effusion however no tamponade physiology. Cardiology was consulted, however it is felt that her PEA arrest was likely triggered by hypersensitivity to sedation.   5/3 she was extubated. Brain MRI pending.   AHF team asked to assist w/ further management of her biventricular heart failure.   Echo 02/2011 LVEF 55-60%, G2DD, RV normal  Echo 07/2019 LVEF 40-45%, RV normal, mod TR Echo 01/2020 LVEF 25-30%, RV moderately reduced, mod-severe TR Echo 05/2020 LVEF <20%, RV severely reduced, Mod-severe TR. Large pericardial effusion. No tamponade physiology   Review of Systems: [y] = yes, [ ]  = no   . General: Weight gain [ ] ; Weight loss [ ] ; Anorexia [ ] ; Fatigue [ ] ; Fever [ ] ; Chills [ ] ; Weakness [ ]   . Cardiac: Chest pain/pressure [ ] ; Resting SOB [ ] ; Exertional SOB [ ] ; Orthopnea [ ] ; Pedal Edema [ ] ; Palpitations [ ] ; Syncope [ ] ; Presyncope [ ] ; Paroxysmal nocturnal dyspnea[ ]   . Pulmonary: Cough [ ] ; Wheezing[ ] ; Hemoptysis[ ] ; Sputum [ ] ; Snoring [ ]   . GI: Vomiting[ ] ; Dysphagia[ ] ; Melena[ ] ; Hematochezia [ ] ; Heartburn[ ] ; Abdominal pain [ ] ; Constipation [ ] ; Diarrhea [ ] ; BRBPR [ ]   . GU: Hematuria[ ] ; Dysuria [ ] ; Nocturia[ ]   . Vascular: Pain in legs with walking [ ] ; Pain in feet with lying flat [ ] ; Non-healing sores [ ] ; Stroke [ Y]; TIA [ ] ; Slurred speech [ Y];  . Neuro: Headaches[ ] ; Vertigo[ ] ; Seizures[ ] ; Paresthesias[ ] ;Blurred  vision [ ] ; Diplopia [ ] ; Vision changes [ ]   . Ortho/Skin: Arthritis [ ] ; Joint pain [ ] ; Muscle pain [ ] ; Joint swelling [ ] ; Back Pain [ ] ; Rash [ ]   . Psych: Depression[ ] ; Anxiety[ ]   . Heme: Bleeding problems [ ] ; Clotting disorders [ ] ; Anemia [ ]   . Endocrine: Diabetes [ ] ; Thyroid dysfunction[ ]   Home Medications Prior to Admission medications   Medication Sig Start Date End Date Taking? Authorizing Provider  amiodarone  (PACERONE) 200 MG tablet Take 1 tablet (200 mg total) by mouth 2 (two) times daily for 12 days. 02/07/20 02/19/20  Doristine Mango L, DO  amiodarone (PACERONE) 200 MG tablet Take 1 tablet (200 mg total) by mouth daily. 02/21/20   Anderson, Chelsey L, DO  amLODipine (NORVASC) 2.5 MG tablet Take 2.5 mg by mouth daily. 02/09/20   [provider]  B-D UF III MINI PEN NEEDLES 31G X 5 MM MISC USE WITH LANTUS PEN DAILY AS DIRECTED 02/11/20   Carollee Leitz, MD  Blood Pressure KIT Monitor blood pressure twice a day 09/28/19   Carollee Leitz, MD  ELIQUIS 5 MG TABS tablet TAKE 1 TABLET(5 MG) BY MOUTH TWICE DAILY 04/10/20   Tobb, Kardie, DO  fluticasone (FLONASE) 50 MCG/ACT nasal spray Place 2 sprays into both nostrils daily. 02/09/20   [provider]  furosemide (LASIX) 40 MG tablet TAKE 1 TABLET(40 MG) BY MOUTH TWICE DAILY 03/03/20   Carollee Leitz, MD  glucose blood (ACCU-CHEK AVIVA) test strip Use as instructed 06/16/17   Zenia Resides, MD  insulin glargine (LANTUS) 100 UNIT/ML Solostar Pen Inject 18 Units into the skin daily. 02/07/20   Anderson, Chelsey L, DO  Lancets (ACCU-CHEK SOFT TOUCH) lancets Once daily testing. 06/16/17   Zenia Resides, MD  Magnesium Oxide 400 MG CAPS Take 1 capsule (400 mg total) by mouth daily. 08/17/19   Richardson Dopp T, PA-C  metolazone (ZAROXOLYN) 5 MG tablet Take 30 minutes before Lasix for 7 days 03/18/20   Tobb, Kardie, DO  metoprolol succinate (TOPROL-XL) 100 MG 24 hr tablet Take 1 tablet (100 mg total) by mouth 2 (two) times daily. ** DO NOT CRUSH **  (BETA BLOCKER) 02/07/20 03/08/20  Anderson, Chelsey L, DO  Semaglutide,0.25 or 0.5MG /DOS, (OZEMPIC, 0.25 OR 0.5 MG/DOSE,) 2 MG/1.5ML SOPN Inject 0.5 mg into the skin once a week. Patient taking differently: Inject 0.5 mg into the skin every Monday. 11/14/19   Leavy Cella, RPH-CPP    Past Medical History: Past Medical History:  Diagnosis Date  . Abdominal pain   . Atrial flutter ([place])   . Back pain   . Chronic  combined systolic and diastolic CHF (congestive heart failure) ([place]) 02/04/2020  . Constipation   . Diabetes mellitus without complication ([place])   . Fatigue   . Full dentures   . Hemorrhoids   . History of noncompliance with medical treatment, presenting hazards to health   . Hypertension   . Lack of adequate sleep   . Liver mass   . PAF (paroxysmal atrial fibrillation) ([place])   . Rash    on right breast  . Wears glasses     Past Surgical History: Past Surgical History:  Procedure Laterality Date  . BREAST DUCTAL SYSTEM EXCISION    . BREAST EXCISIONAL BIOPSY Left   . CARDIOVERSION N/A 07/31/2019   Procedure: CARDIOVERSION;  Surgeon: Pixie Casino, MD;  Location: [facility] ENDOSCOPY;  Service: Cardiovascular;  Laterality: N/A;  . CHOLECYSTECTOMY    .  CHOLECYSTECTOMY, LAPAROSCOPIC  07/24/2010  . ESOPHAGEAL DILATION    . IR CT HEAD LTD  05/12/2020  . IR PERCUTANEOUS ART THROMBECTOMY/INFUSION INTRACRANIAL INC DIAG ANGIO  05/12/2020      . IR PERCUTANEOUS ART THROMBECTOMY/INFUSION INTRACRANIAL INC DIAG ANGIO  05/12/2020  . IR US GUIDE VASC ACCESS RIGHT  05/12/2020  . RADIOLOGY WITH ANESTHESIA N/A 05/12/2020   Procedure: IR WITH ANESTHESIA;  Surgeon: Radiologist, Medication, MD;  Location: [facility];  Service: Radiology;  Laterality: N/A;  . TEE WITHOUT CARDIOVERSION N/A 07/31/2019   Procedure: TRANSESOPHAGEAL ECHOCARDIOGRAM (TEE);  Surgeon: Pixie Casino, MD;  Location: [facility] ENDOSCOPY;  Service: Cardiovascular;  Laterality: N/A;    Family History: Family History  Problem Relation Age of Onset  . Stroke Mother   . Other Father        broken heart after spouse spouse  . Diabetes Sister   . Hypertension Sister   . Diabetes Sister   . Multiple sclerosis Sister   . Alcohol abuse Other   . Diabetes Other   . Breast cancer Neg Hx     Social History: Social History   Socioeconomic History  . Marital status: Single    Spouse name: Not on file  . Number of children: 3  . Years of education:  Not on file  . Highest education level: High school graduate  Occupational History  . Occupation: Retired    Fish farm manager: OTHER    Comment: CNA  Tobacco Use  . Smoking status: Never Smoker  . Smokeless tobacco: Never Used  Vaping Use  . Vaping Use: Never used  Substance and Sexual Activity  . Alcohol use: No    Alcohol/week: 0.0 standard drinks  . Drug use: No  . Sexual activity: Not Currently  Other Topics Concern  . Not on file  Social History Narrative   Patient lives alone in an apartment complex for seniors.   Patient does not drive. She walks to surrounding places near her complex or her children run errands for her. Patient never goes without.   Patient enjoys sewing, crafts, shopping, and walking.    Patient has 3 children and she is close with all of them.        Social Determinants of Health   Financial Resource Strain: Not on file  Food Insecurity: Not on file  Transportation Needs: Not on file  Physical Activity: Not on file  Stress: Not on file  Social Connections: Not on file    Allergies:  Allergies  Allergen Reactions  . Fruit & Vegetable Daily [Nutritional Supplements] Swelling and Other (See Comments)    All fruits cause tongue swelling and numbness Organic fruits are okay to eat  . Other Swelling    Tree nuts cause tongue swelling and numbness  . Peanut-Containing Drug Products Swelling and Other (See Comments)    Tongue swelling and numbness  . Aspirin Nausea And Vomiting and Other (See Comments)    Cannot take due to liver issues Other reaction(s): Unknown  . Tylenol [Acetaminophen] Other (See Comments)    Cannot take due to liver issues    Objective:    Vital Signs:   Temp:  [97.5 F (36.4 C)-98.3 F (36.8 C)] 97.6 F (36.4 C) (05/03 1200) Pulse Rate:  [56-129] 123 (05/03 1215) Resp:  [0-36] 30 (05/03 1215) BP: (87-158)/(66-145) 158/145 (05/03 1215) SpO2:  [87 %-100 %] 97 % (05/03 1215) Arterial Line BP: (73-169)/(55-104) 126/73 (05/03  1215) FiO2 (%):  [40 %-100 %] 40 % (05/03  0820) Weight:  [90.1 kg] 90.1 kg (05/03 0500) Last BM Date:  (pta)  Weight change: Filed Weights   05/12/20 1200 05/13/20 0500  Weight: 89.8 kg 90.1 kg    Intake/Output:   Intake/Output Summary (Last 24 hours) at 05/13/2020 1308 Last data filed at 05/13/2020 1200 Gross per 24 hour  Intake 973.62 ml  Output 1010 ml  Net -36.38 ml      Physical Exam    Ge see below   Telemetry   AF with RVR 110-160   EKG    Admit EKG Afib w/ RVR 112 bpm   Labs   Basic Metabolic Panel: Recent Labs  Lab 05/12/20 1220 05/12/20 1228 05/12/20 1430 05/12/20 1550 05/12/20 1602 05/13/20 0527  NA 135 137 136  --  136 134*  K 3.4* 3.4* 4.1  --  3.3* 3.5  CL 100 101  --   --   --  102  CO2 22  --   --   --   --  22  GLUCOSE 257* 253*  --   --   --  166*  BUN 15 17  --   --   --  20  CREATININE 1.09* 0.90  --   --   --  1.19*  CALCIUM 8.8*  --   --   --   --  8.5*  MG  --   --   --  1.4*  --  2.4  PHOS  --   --   --  4.9*  --  4.5    Liver Function Tests: Recent Labs  Lab 05/12/20 1220 05/13/20 0527  AST 47* 141*  ALT 26 77*  ALKPHOS 106 109  BILITOT 1.0 0.8  PROT 6.4* 5.3*  ALBUMIN 2.6* 2.1*   No results for input(s): LIPASE, AMYLASE in the last 168 hours. No results for input(s): AMMONIA in the last 168 hours.  CBC: Recent Labs  Lab 05/12/20 1220 05/12/20 1228 05/12/20 1430 05/12/20 1602 05/13/20 0527  WBC 6.2  --   --   --  13.1*  NEUTROABS 3.4  --   --   --   --   HGB 13.5 15.3* 14.3 13.6 12.3  HCT 42.8 45.0 42.0 40.0 36.8  MCV 79.6*  --   --   --  75.4*  PLT 242  --   --   --  235    Cardiac Enzymes: No results for input(s): CKTOTAL, CKMB, CKMBINDEX, TROPONINI in the last 168 hours.  BNP: BNP (last 3 results) Recent Labs    03/03/20 1559  BNP 1,981.9*    ProBNP (last 3 results) No results for input(s): PROBNP in the last 8760 hours.   CBG: Recent Labs  Lab 05/12/20 1221 05/12/20 2005 05/13/20 0003  05/13/20 0359 05/13/20 0810  GLUCAP 242* 225* 263* 174* 120*    Coagulation Studies: Recent Labs    05/12/20 1220  LABPROT 16.5*  INR 1.3*     Imaging   DG Abd 1 View  Result Date: 05/12/2020 CLINICAL DATA:  Enteric catheter placement EXAM: ABDOMEN - 1 VIEW COMPARISON:  None. FINDINGS: Frontal view of the lower chest and upper abdomen demonstrates enteric catheter passing below diaphragm tip overlying proximal duodenum. External defibrillator pads overlie the lower chest. Bowel gas pattern is unremarkable. IMPRESSION: 1. Enteric catheter tip projecting over proximal duodenum. Electronically Signed   By: Randa Ngo M.D.   On: 05/12/2020 16:28   IR CT Head Ltd  Result Date: 05/13/2020 INDICATION:  70 year old female with past medical history significant for atrial fibrillation and CHF. She was last known well at 10 a.m. on 05/12/2020. At 11:30 a.m., she was noted to have facial droop and slurred speech. She was brought by EMS to ED. At presentation, she head left-sided hemiparesis with NIHSS 6. Head CT showed no evidence of a large territorial infarct or hemorrhage. CT angiogram showed an occlusion of the right ICA terminus extending into the proximal M1 and A1. IV tPA bolus was administered at 13:15. She was then taken to our service for a mechanical thrombectomy. EXAM: ULTRASOUND-GUIDED VASCULAR ACCESS DIAGNOSTIC CEREBRAL ANGIOGRAM MECHANICAL THROMBECTOMY FLAT PANEL HEAD CT COMPARISON:  CT/CT angiogram of the head and neck May 12, 2020 MEDICATIONS: Refer to anesthesia documentation ANESTHESIA/SEDATION: The procedure was performed in the general anesthesia. CONTRAST:  50 mL of Omnipaque 300. FLUOROSCOPY TIME:  Fluoroscopy Time: 8 minutes (233 mGy). COMPLICATIONS: None immediate. TECHNIQUE: Informed written consent was obtained from the patient's daughter after a thorough discussion of the procedural risks, benefits and alternatives. All questions were addressed. Maximal Sterile Barrier  Technique was utilized including caps, mask, sterile gowns, sterile gloves, sterile drape, hand hygiene and skin antiseptic. A timeout was performed prior to the initiation of the procedure. The right groin was prepped and draped in the usual sterile fashion. Using a micropuncture kit and the modified Seldinger technique, access was gained to the right common femoral artery and an 8 French sheath was placed. Real-time ultrasound guidance was utilized for vascular access including the acquisition of a permanent ultrasound image documenting patency of the accessed vessel. Under fluoroscopy, a Zoom 88 guide catheter was navigated over a 6 [place] Berenstein 2 catheter and a 0.035" Terumo Glidewire into the aortic arch. The catheter was placed into the right common carotid artery. Frontal and lateral angiograms of the head and neck were obtained. FINDINGS: Patent right common femoral artery. There is no occlusion of the intracranial right internal carotid just distal to the origin of the right posterior communicating artery. Minimal distal contrast penetration is seen. PROCEDURE: Under biplane roadmap, the catheter construct was advanced over the Glidewire into the cervical right internal carotid artery. The Berenstein catheter was removed. Under fluoroscopy, a zoom 71 aspiration catheter was navigated through the zoom 88 guide catheter and over an Aristotle 18 micro guidewire into the supraclinoid right ICA. The zoom 71 catheter was connected to a penumbra aspiration pump in continuous aspiration was performed for approximately 1 minute. Continuous aspiration was also performed concomitantly in the zoom 88 catheter with a 60 cc vacuum pressure syringe. The aspiration catheter was removed under constant aspiration. The guiding catheter was aspirated until clear blood was obtained. Right internal carotid artery angiogram showed complete recanalization of the right ICA (TICI3). Delay right ICA angiograms showed persistent  patency of the right ICA, right MCA ACA vascular trees. Flat panel CT of the head was obtained and post processed in a separate workstation with concurrent attending physician supervision. Selected images were sent to PACS. No evidence of hemorrhagic complication. A right common femoral artery angiogram was obtained with frontal view. The access is at the level of the right common femoral artery which has adequate caliber for closure device. The femoral sheath was exchanged over the wire for a Perclose prostyle which was utilized for access closure. Immediate hemostasis was achieved. Patient became hypotensive while awaiting COVID test result for extubation, after intervention was concluded and eventually progressed to PEA arrest. ROSC obtained after 2 rounds of chest compressions. Patient  stabilized and transferred to ICU. Please see anesthesia documentation for further details. Family updated by telephone. IMPRESSION: 1. Successful mechanical thrombectomy for treatment of a right ICA terminus occlusion with direct contact aspiration. Total of 1 pass with complete recanalization (TICI 3). 2. No thromboembolic or hemorrhagic complication. 3. Postprocedural PEA cardiac arrest with ROSC obtained after 2 rounds of chest compression. Please see anesthesia documentation for further details. PLAN: 1. Transferd to ICU for further care. 2. Follow-up head CT/MRI within 24 hours. Electronically Signed   By: Pedro Earls M.D.   On: 05/13/2020 10:33   IR US Guide Vasc Access Right  Result Date: 05/13/2020 INDICATION: 70 year old female with past medical history significant for atrial fibrillation and CHF. She was last known well at 10 a.m. on 05/12/2020. At 11:30 a.m., she was noted to have facial droop and slurred speech. She was brought by EMS to ED. At presentation, she head left-sided hemiparesis with NIHSS 6. Head CT showed no evidence of a large territorial infarct or hemorrhage. CT angiogram showed an  occlusion of the right ICA terminus extending into the proximal M1 and A1. IV tPA bolus was administered at 13:15. She was then taken to our service for a mechanical thrombectomy. EXAM: ULTRASOUND-GUIDED VASCULAR ACCESS DIAGNOSTIC CEREBRAL ANGIOGRAM MECHANICAL THROMBECTOMY FLAT PANEL HEAD CT COMPARISON:  CT/CT angiogram of the head and neck May 12, 2020 MEDICATIONS: Refer to anesthesia documentation ANESTHESIA/SEDATION: The procedure was performed in the general anesthesia. CONTRAST:  50 mL of Omnipaque 300. FLUOROSCOPY TIME:  Fluoroscopy Time: 8 minutes (233 mGy). COMPLICATIONS: None immediate. TECHNIQUE: Informed written consent was obtained from the patient's daughter after a thorough discussion of the procedural risks, benefits and alternatives. All questions were addressed. Maximal Sterile Barrier Technique was utilized including caps, mask, sterile gowns, sterile gloves, sterile drape, hand hygiene and skin antiseptic. A timeout was performed prior to the initiation of the procedure. The right groin was prepped and draped in the usual sterile fashion. Using a micropuncture kit and the modified Seldinger technique, access was gained to the right common femoral artery and an 8 French sheath was placed. Real-time ultrasound guidance was utilized for vascular access including the acquisition of a permanent ultrasound image documenting patency of the accessed vessel. Under fluoroscopy, a Zoom 88 guide catheter was navigated over a 6 [place] Berenstein 2 catheter and a 0.035" Terumo Glidewire into the aortic arch. The catheter was placed into the right common carotid artery. Frontal and lateral angiograms of the head and neck were obtained. FINDINGS: Patent right common femoral artery. There is no occlusion of the intracranial right internal carotid just distal to the origin of the right posterior communicating artery. Minimal distal contrast penetration is seen. PROCEDURE: Under biplane roadmap, the catheter  construct was advanced over the Glidewire into the cervical right internal carotid artery. The Berenstein catheter was removed. Under fluoroscopy, a zoom 71 aspiration catheter was navigated through the zoom 88 guide catheter and over an Aristotle 18 micro guidewire into the supraclinoid right ICA. The zoom 71 catheter was connected to a penumbra aspiration pump in continuous aspiration was performed for approximately 1 minute. Continuous aspiration was also performed concomitantly in the zoom 88 catheter with a 60 cc vacuum pressure syringe. The aspiration catheter was removed under constant aspiration. The guiding catheter was aspirated until clear blood was obtained. Right internal carotid artery angiogram showed complete recanalization of the right ICA (TICI3). Delay right ICA angiograms showed persistent patency of the right ICA, right MCA ACA vascular  trees. Flat panel CT of the head was obtained and post processed in a separate workstation with concurrent attending physician supervision. Selected images were sent to PACS. No evidence of hemorrhagic complication. A right common femoral artery angiogram was obtained with frontal view. The access is at the level of the right common femoral artery which has adequate caliber for closure device. The femoral sheath was exchanged over the wire for a Perclose prostyle which was utilized for access closure. Immediate hemostasis was achieved. Patient became hypotensive while awaiting COVID test result for extubation, after intervention was concluded and eventually progressed to PEA arrest. ROSC obtained after 2 rounds of chest compressions. Patient stabilized and transferred to ICU. Please see anesthesia documentation for further details. Family updated by telephone. IMPRESSION: 1. Successful mechanical thrombectomy for treatment of a right ICA terminus occlusion with direct contact aspiration. Total of 1 pass with complete recanalization (TICI 3). 2. No thromboembolic  or hemorrhagic complication. 3. Postprocedural PEA cardiac arrest with ROSC obtained after 2 rounds of chest compression. Please see anesthesia documentation for further details. PLAN: 1. Transferd to ICU for further care. 2. Follow-up head CT/MRI within 24 hours. Electronically Signed   By: Pedro Earls M.D.   On: 05/13/2020 10:33   DG CHEST PORT 1 VIEW  Result Date: 05/13/2020 CLINICAL DATA:  Respiratory failure.  Hypoxia. EXAM: PORTABLE CHEST 1 VIEW COMPARISON:  05/12/2020. FINDINGS: NG tube noted with tip below left hemidiaphragm. Endotracheal tube and right IJ line in stable position. Severe cardiomegaly again noted. Diffuse bilateral pulmonary infiltrates/edema noted on today's exam. Left base atelectasis. Probable moderate left pleural effusion. No pneumothorax. IMPRESSION: 1. Interim placement of NG tube. Endotracheal tube right IJ line stable position. 2.  Severe cardiomegaly again noted. 3. Diffuse bilateral pulmonary infiltrates/edema noted on today's exam. Left base atelectasis. Probable moderate left pleural effusion. Electronically Signed   By: Marcello Moores  Register   On: 05/13/2020 05:56   Portable Chest x-ray  Result Date: 05/12/2020 CLINICAL DATA:  Stroke and respiratory failure. EXAM: PORTABLE CHEST 1 VIEW COMPARISON:  02/04/2020 FINDINGS: Stable cardiac enlargement. Endotracheal tube present with the tip approximately 3 cm above the carina. Right jugular central line present with the tip in the lower SVC. No pneumothorax. Left lower lobe atelectasis/consolidation present. No significant pulmonary edema or pleural fluid. IMPRESSION: Left lower lobe atelectasis/consolidation.  No pneumothorax. Electronically Signed   By: Aletta Edouard M.D.   On: 05/12/2020 16:25   ECHOCARDIOGRAM COMPLETE  Result Date: 05/12/2020    ECHOCARDIOGRAM REPORT   Patient Name:   [name] [name] Date of Exam: 05/12/2020 Medical Rec #:  [id number]         Height:       63.0 in Accession #:    [id number]         Weight:       198.0 lb Date of Birth:  27-Dec-1950        BSA:          1.925 m Patient Age:    40 years          BP:           223/89 mmHg Patient Gender: F                 HR:           113 bpm. Exam Location:  Inpatient Procedure: 2D Echo, Cardiac Doppler and Color Doppler STAT ECHO REPORT CONTAINS CRITICAL RESULT Indications:    Stroke.; I50.40* Unspecified combined  systolic (congestive) and                 diastolic (congestive) heart failure  History:        Patient has prior history of Echocardiogram examinations. CHF,                 Abnormal ECG, Stroke, Arrythmias:Atrial Fibrillation,                 Signs/Symptoms:Hypotension, Shortness of Breath and Dyspnea;                 Risk Factors:Hypertension and Dyslipidemia. Pericardial                 effusion.  Sonographer:    Roseanna Rainbow RDCS Referring Phys: [id number] DAYNA N DUNN  Sonographer Comments: Technically difficult study due to poor echo windows, patient is morbidly obese and echo performed with patient supine and on artificial respirator. Image acquisition challenging due to patient body habitus. IMPRESSIONS  1. Left ventricular ejection fraction, by estimation, is <20%. The left ventricle has severely decreased function. The left ventricle demonstrates global hypokinesis. There is severe concentric left ventricular hypertrophy. Diastolic function is indeterminant due to atrial fibrillation. No LV thrombus visualized, however, apex incompletely visualized due to lack of definity contrast.  2. Right ventricular systolic function is severely reduced. The right ventricular size is mildly enlarged. There is mildly elevated pulmonary artery systolic pressure. The estimated right ventricular systolic pressure is 23.3 mmHg.  3. A large, circumferential pericardial effusion is present. The effusion appears to be greatest around the posterolateral LV measuring up to 2.2cm at end-diastole. There is no RV or RA collapse to suggest tamponade. IVC is dilated and  not collapsible but patient is on the vent with significant TR, which are likely the primary drivers of IVC dilation.  4. The mitral valve is normal in structure. Mild mitral valve regurgitation.  5. Tricuspid valve regurgitation is moderate to severe.  6. The aortic valve is tricuspid. There is mild calcification of the aortic valve. There is mild thickening of the aortic valve. Aortic valve regurgitation is trivial. Mild aortic valve sclerosis is present, with no evidence of aortic valve stenosis.  7. Left atrial size was mildly dilated. Comparison(s): Compared to prior TTE in 01/2020, the LVEF is now severely reduced (<20%) and a large pericardial effusion is present (previously small). The RV systolic function now appears severely reduced. FINDINGS  Left Ventricle: Left ventricular ejection fraction, by estimation, is <20%. The left ventricle has severely decreased function. The left ventricle demonstrates global hypokinesis. The left ventricular internal cavity size was normal in size. There is severe concentric left ventricular hypertrophy. Diastolic function is indeterminant due to atrial fibrillation. No LV thrombus visualized, however, apex incompletely visualized due to lack of definity contrast. Right Ventricle: The right ventricular size is mildly enlarged. No increase in right ventricular wall thickness. Right ventricular systolic function is severely reduced. There is mildly elevated pulmonary artery systolic pressure. The tricuspid regurgitant velocity is 2.53 m/s, and with an assumed right atrial pressure of 15 mmHg, the estimated right ventricular systolic pressure is 00.7 mmHg. Left Atrium: Left atrial size was mildly dilated. Right Atrium: Right atrial size was normal in size. Pericardium: A large, circumferential pericardial effusion is present. The effusion appears to be greatest around the posterolateral LV measuring up to 2.2cm at end-diastole. There is no RV or RA collapse to suggest  tamponade. IVC is dilated and not collapsible but patient is on the  vent with significant TR, which are likely the primary drivers of IVC dilation. Mitral Valve: The mitral valve is normal in structure. There is mild thickening of the mitral valve leaflet(s). There is mild calcification of the mitral valve leaflet(s). Mild mitral annular calcification. Mild mitral valve regurgitation. Tricuspid Valve: The tricuspid valve is normal in structure. Tricuspid valve regurgitation is moderate to severe. The flow in the hepatic veins is reversed during ventricular systole. Aortic Valve: The aortic valve is tricuspid. There is mild calcification of the aortic valve. There is mild thickening of the aortic valve. Aortic valve regurgitation is trivial. Mild aortic valve sclerosis is present, with no evidence of aortic valve stenosis. Pulmonic Valve: The pulmonic valve was not well visualized. Pulmonic valve regurgitation is trivial. Aorta: The aortic root and ascending aorta are structurally normal, with no evidence of dilitation. IAS/Shunts: No atrial level shunt detected by color flow Doppler. Additional Comments: There is a small pleural effusion in the left lateral region.  LEFT VENTRICLE PLAX 2D LVIDd:         4.80 cm      Diastology LVIDs:         4.60 cm      LV e' medial:    4.13 cm/s LV PW:         1.40 cm      LV E/e' medial:  14.9 LV IVS:        1.60 cm      LV e' lateral:   6.90 cm/s LVOT diam:     2.10 cm      LV E/e' lateral: 8.9 LV SV:         21 LV SV Index:   11 LVOT Area:     3.46 cm  LV Volumes (MOD) LV vol d, MOD A2C: 104.0 ml LV vol d, MOD A4C: 63.1 ml LV vol s, MOD A2C: 97.8 ml LV vol s, MOD A4C: 54.0 ml LV SV MOD A2C:     6.2 ml LV SV MOD A4C:     63.1 ml LV SV MOD BP:      5.5 ml RIGHT VENTRICLE            IVC RV S prime:     5.70 cm/s  IVC diam: 2.20 cm TAPSE (M-mode): 1.0 cm LEFT ATRIUM             Index       RIGHT ATRIUM           Index LA diam:        3.40 cm 1.77 cm/m  RA Area:     20.70 cm LA  Vol (A2C):   53.4 ml 27.74 ml/m RA Volume:   65.50 ml  34.02 ml/m LA Vol (A4C):   60.4 ml 31.37 ml/m LA Biplane Vol: 59.7 ml 31.01 ml/m  AORTIC VALVE             PULMONIC VALVE LVOT Vmax:   58.70 cm/s  PR End Diast Vel: 2.11 msec LVOT Vmean:  34.400 cm/s LVOT VTI:    0.061 m  AORTA Ao Root diam: 3.30 cm MITRAL VALVE               TRICUSPID VALVE MV Area (PHT): 6.17 cm    TR Peak grad:   25.6 mmHg MV Decel Time: 123 msec    TR Vmax:        253.00 cm/s MV E velocity: 61.38 cm/s  SHUNTS                            Systemic VTI:  0.06 m                            Systemic Diam: 2.10 cm [name] Kaufman MD Electronically signed by [name] Kaufman MD Signature Date/Time: 05/12/2020/7:24:37 PM    Final    IR PERCUTANEOUS ART THROMBECTOMY/INFUSION INTRACRANIAL INC DIAG ANGIO  Result Date: 05/13/2020 INDICATION: 70 year old female with past medical history significant for atrial fibrillation and CHF. She was last known well at 10 a.m. on 05/12/2020. At 11:30 a.m., she was noted to have facial droop and slurred speech. She was brought by EMS to ED. At presentation, she head left-sided hemiparesis with NIHSS 6. Head CT showed no evidence of a large territorial infarct or hemorrhage. CT angiogram showed an occlusion of the right ICA terminus extending into the proximal M1 and A1. IV tPA bolus was administered at 13:15. She was then taken to our service for a mechanical thrombectomy. EXAM: ULTRASOUND-GUIDED VASCULAR ACCESS DIAGNOSTIC CEREBRAL ANGIOGRAM MECHANICAL THROMBECTOMY FLAT PANEL HEAD CT COMPARISON:  CT/CT angiogram of the head and neck May 12, 2020 MEDICATIONS: Refer to anesthesia documentation ANESTHESIA/SEDATION: The procedure was performed in the general anesthesia. CONTRAST:  50 mL of Omnipaque 300. FLUOROSCOPY TIME:  Fluoroscopy Time: 8 minutes (233 mGy). COMPLICATIONS: None immediate. TECHNIQUE: Informed written consent was obtained from the patient's daughter after a thorough  discussion of the procedural risks, benefits and alternatives. All questions were addressed. Maximal Sterile Barrier Technique was utilized including caps, mask, sterile gowns, sterile gloves, sterile drape, hand hygiene and skin antiseptic. A timeout was performed prior to the initiation of the procedure. The right groin was prepped and draped in the usual sterile fashion. Using a micropuncture kit and the modified Seldinger technique, access was gained to the right common femoral artery and an 8 French sheath was placed. Real-time ultrasound guidance was utilized for vascular access including the acquisition of a permanent ultrasound image documenting patency of the accessed vessel. Under fluoroscopy, a Zoom 88 guide catheter was navigated over a 6 [place] Berenstein 2 catheter and a 0.035" Terumo Glidewire into the aortic arch. The catheter was placed into the right common carotid artery. Frontal and lateral angiograms of the head and neck were obtained. FINDINGS: Patent right common femoral artery. There is no occlusion of the intracranial right internal carotid just distal to the origin of the right posterior communicating artery. Minimal distal contrast penetration is seen. PROCEDURE: Under biplane roadmap, the catheter construct was advanced over the Glidewire into the cervical right internal carotid artery. The Berenstein catheter was removed. Under fluoroscopy, a zoom 71 aspiration catheter was navigated through the zoom 88 guide catheter and over an Aristotle 18 micro guidewire into the supraclinoid right ICA. The zoom 71 catheter was connected to a penumbra aspiration pump in continuous aspiration was performed for approximately 1 minute. Continuous aspiration was also performed concomitantly in the zoom 88 catheter with a 60 cc vacuum pressure syringe. The aspiration catheter was removed under constant aspiration. The guiding catheter was aspirated until clear blood was obtained. Right internal carotid  artery angiogram showed complete recanalization of the right ICA (TICI3). Delay right ICA angiograms showed persistent patency of the right ICA, right MCA ACA vascular trees. Flat panel CT of the head was obtained and post processed in a separate workstation  with concurrent attending physician supervision. Selected images were sent to PACS. No evidence of hemorrhagic complication. A right common femoral artery angiogram was obtained with frontal view. The access is at the level of the right common femoral artery which has adequate caliber for closure device. The femoral sheath was exchanged over the wire for a Perclose prostyle which was utilized for access closure. Immediate hemostasis was achieved. Patient became hypotensive while awaiting COVID test result for extubation, after intervention was concluded and eventually progressed to PEA arrest. ROSC obtained after 2 rounds of chest compressions. Patient stabilized and transferred to ICU. Please see anesthesia documentation for further details. Family updated by telephone. IMPRESSION: 1. Successful mechanical thrombectomy for treatment of a right ICA terminus occlusion with direct contact aspiration. Total of 1 pass with complete recanalization (TICI 3). 2. No thromboembolic or hemorrhagic complication. 3. Postprocedural PEA cardiac arrest with ROSC obtained after 2 rounds of chest compression. Please see anesthesia documentation for further details. PLAN: 1. Transferd to ICU for further care. 2. Follow-up head CT/MRI within 24 hours. Electronically Signed   By: Pedro Earls M.D.   On: 05/13/2020 10:33      Medications:     Current Medications: . amiodarone  150 mg Intravenous Once  . atorvastatin  80 mg Oral Daily  . Chlorhexidine Gluconate Cloth  6 each Topical Daily  . fentaNYL (SUBLIMAZE) injection  12.5 mcg Intravenous Once  . insulin aspart  0-15 Units Subcutaneous Q4H  . insulin glargine  18 Units Subcutaneous QHS  .  pantoprazole (PROTONIX) IV  40 mg Intravenous QHS  . potassium chloride  20 mEq Oral Daily     Infusions: . sodium chloride    . amiodarone 30 mg/hr (05/13/20 1200)  . clevidipine    . norepinephrine (LEVOPHED) Adult infusion 2 mcg/min (05/13/20 1200)       Assessment/Plan   1. Acute on chronic systolic HF ->  Cardiogenic shock - EF < 15% with severe biventricular failure - almost certainly due to tachy-induced (AF) cardiomyopathy 2. AF with RVR 3. Acute embolic CVA  - s/p successful revascularization with systemic T-PA and mechanical thrombectomy 4. Non-compliance 5. Bipolar d/o 6. Poorly controlled DM2 7. PEA arrest   Length of Stay: 1  Brittainy Simmons, PA-C  05/13/2020, 1:08 PM  Advanced Heart Failure Team Pager [phone] (M-F; 7a - 5p)  Please contact [facility] for night-coverage after hours (4p -7a ) and weekends on [email or web address]  Agree with above.   Very complicated case. Basically her recent issues stem from persistent AF with RVR.  Previously underwent DC-CV and started on amiodarone to assist with maintenance of SR but was unable to tolerate meds due to side effects and was noncompliant with meds and f/u.   Now admitted with acute embolic CVA s/p a heroic revascularization with T-pa and mechanical thrombectomy with no clinical residual and no significant defect on MRI. Had breief PEA arrest post revascularization. Had brief CPR and intubated. Now extubated   Course now complicated with profound cardiogenic shock due to tachy CM from persistent AF with RVR. Echo with EF < 10% and severe RV dysfunction. Also moderate pericardial effusion without tamponade  CVP 18 Co-ox 47%  SBP 70s on NE 4 HR 110-140 on amio   General:  Fatigued appearing. No resp difficulty HEENT: normal Neck: supple. JVP to jaw . Carotids 2+ bilat; no bruits. No lymphadenopathy or thryomegaly appreciated. Cor: PMI nondisplaced. Irregular tachy +s3 Lungs: clear Abdomen: soft, nontender,  nondistended. No  hepatosplenomegaly. No bruits or masses. Good bowel sounds. Extremities: no cyanosis, clubbing, rash, 2-3+ edema  Ice cold  Neuro: alert & orientedx3, cranial nerves grossly intact. moves all 4 extremities w/o difficulty. Affect pleasant  She has profound cardiogenic shock with marked volume overload in setting of tachy-induced CM due to persistent AF with RVR.   I told her and her daughters that her issues are potentially completely reversible if we can control her AF and support her while we give her heart time to recover. However this will entail her being completely compliant with prescribed therapies including possible PPM if needed. If unable/unwilling to comply with these therapies then the only other option would be to proceed with comfort care. Ms. Takeda remains resistant to some of these therapies in our initial discussions but they are currently discussing next steps.   If desires aggressive care would recommend:  1) Move to [facility] ICU for closer cardiac care 2) Titrate NE to keep MAP > 70 and co-ox > 55%. Add milrinone as needed 3) Increase amio rate to 60/hr 4) heparin anti-coagulation  5) Potential TEE/DC-CV as needed 6) IV diuresis with lasix  D/w Dr. [place] Bells earlier today.    CRITICAL CARE Performed by: Glori Bickers  Total critical care time: 95 minutes  Critical care time was exclusive of separately billable procedures and treating other patients.  Critical care was necessary to treat or prevent imminent or life-threatening deterioration.  Critical care was time spent personally by me (independent of midlevel providers or residents) on the following activities: development of treatment plan with patient and/or surrogate as well as nursing, discussions with consultants, evaluation of patient's response to treatment, examination of patient, obtaining history from patient or surrogate, ordering and performing treatments and interventions, ordering and  review of laboratory studies, ordering and review of radiographic studies, pulse oximetry and re-evaluation of patient's condition.  Glori Bickers, MD  7:51 PM  Addendum  F/u discussion with patient and family. Want to pursue aggressive care. Will move to [place].   I discussed with 4N nursing team and also called [place] team to discuss. Bed available. Will arrange transfer to [place]. Remain on CCM service for now.   Additional 30 mins CCT  Glori Bickers MD

## 2020-05-13 NOTE — Progress Notes (Signed)
OT Cancellation Note  Patient Details Name: [name] [name] MRN: [id number] DOB: 10-21-1950   Cancelled Treatment:    Reason Eval/Treat Not Completed: Patient not medically ready (held due to extubated, held due to MRI pending, end of shift attempting and HR 120-140 without movement. Will reattempt at more appropriate time)  Wynona Neat, OTR/L  Acute Rehabilitation Services Pager: [phone] Office: [phone] .  05/13/2020, 3:56 PM

## 2020-05-13 NOTE — Progress Notes (Signed)
RT NOTE: patient on CPAP/PSV of 8/5.  Currently tolerating well.  Will continue to monitor.

## 2020-05-13 NOTE — Progress Notes (Addendum)
0830 pt extubated and placed on 3L [place]. Daughter Shon Millet at bedside.   1200 HR slowing increasing to sustain upper 130s. Leandrew Koyanagi MD made aware, orders placed for amio bolus.   1300 Pt taken to MRI, MD B.McQuaid called for PRN pain medication; order received for x1 dose IV fentanyl 12.5 mcg  1530 MD B. McQuaid at bedside rounding on unit, pt HR rising into 140s, orders placed for amio bolus as well as PRN pain medication as pt c/o back pain.   1630 MD B. McQuaid made aware of UOP slowing, low/poor oxygen reading (no increased O2 needs nor respiratory effort); orders placed for lasix.   1715 HR sustaining 140s-150s; new orders placed for amio bolus and collect lactic, co ox labs.   1845 Received call from D. Bensimhon MD, orders received to recollect co ox and give another amio bolus at this time.

## 2020-05-13 NOTE — Progress Notes (Signed)
ANTICOAGULATION CONSULT NOTE - Initial Consult  Pharmacy Consult for Heparin Indication: atrial fibrillation  Allergies  Allergen Reactions  . Fruit & Vegetable Daily [Nutritional Supplements] Swelling and Other (See Comments)    All fruits cause tongue swelling and numbness Organic fruits are okay to eat  . Other Swelling    Tree nuts cause tongue swelling and numbness  . Peanut-Containing Drug Products Swelling and Other (See Comments)    Tongue swelling and numbness  . Aspirin Nausea And Vomiting and Other (See Comments)    Cannot take due to liver issues Other reaction(s): Unknown  . Tylenol [Acetaminophen] Other (See Comments)    Cannot take due to liver issues    Patient Measurements: Weight: 90.1 kg (198 lb 10.2 oz) Heparin Dosing Weight: 67 kg  Vital Signs: Temp: 97.8 F (36.6 C) (05/03 2000) Temp Source: Axillary (05/03 2000) BP: 162/134 (05/03 1815) Pulse Rate: 94 (05/03 1830)  Labs: Recent Labs    05/12/20 1220 05/12/20 1228 05/12/20 1430 05/12/20 1550 05/12/20 1602 05/13/20 0527  HGB 13.5 15.3* 14.3  --  13.6 12.3  HCT 42.8 45.0 42.0  --  40.0 36.8  PLT 242  --   --   --   --  235  APTT 26  --   --   --   --   --   LABPROT 16.5*  --   --   --   --   --   INR 1.3*  --   --   --   --   --   CREATININE 1.09* 0.90  --   --   --  1.19*  TROPONINIHS  --   --   --  99*  --   --     Estimated Creatinine Clearance: 48.2 mL/min (A) (by C-G formula based on SCr of 1.19 mg/dL (H)).   Medical History: Past Medical History:  Diagnosis Date  . Abdominal pain   . Atrial flutter (HCC)   . Back pain   . Chronic combined systolic and diastolic CHF (congestive heart failure) (HCC) 02/04/2020  . Constipation   . Diabetes mellitus without complication (HCC)   . Fatigue   . Full dentures   . Hemorrhoids   . History of noncompliance with medical treatment, presenting hazards to health   . Hypertension   . Lack of adequate sleep   . Liver mass   . PAF  (paroxysmal atrial fibrillation) (HCC)   . Rash    on right breast  . Wears glasses     Assessment: 70 y/o AAM w/ h/o PAF/ atrial flutter + SSS intolerant in the past to AVN blocking agents due to intermittent bradycardia w/ pauses, combined chronic diastolic and systolic heart failure. Admitted for acute CVA in the setting of poor compliance with Eliquis. Pharmacy consulted to start Heparin drip - no bolus, low goal. Last dose of Eliquis not known, will monitor aPTT and HL until they correlate.  Goal of Therapy:  Heparin level 0.3 - 0.5 units/ml APTT 66 - 85 secs Monitor platelets by anticoagulation protocol: Yes   Plan:  Start heparin infusion at 1000 units/hr Check anti-Xa level and aPTT in 8 hours and daily while on heparin Continue to monitor H&H and platelets  Jeanella Cara, PharmD, [facility] Clinical Pharmacist Please see AMION for all Pharmacists' Contact Phone Numbers 05/13/2020, 8:47 PM

## 2020-05-13 NOTE — Procedures (Signed)
Extubation Procedure Note  Patient Details:   Name: [name] [name] DOB: 1951/01/11 MRN: [id number]   Airway Documentation:    Vent end date: 05/13/20 Vent end time: 0840   Evaluation  O2 sats: stable throughout Complications: No apparent complications Patient did tolerate procedure well. Bilateral Breath Sounds: Diminished   Yes   Patient extubated to 3L [place] per MD order.  Positive cuff leak noted.  No evidence of stridor.  Patient able to speak post extubation.  Sats currently 97%.  Vitals are stable.  Incentive spirometry performed with good effort.  No complications at this time.  Will continue to monitor.   Elyn Peers 05/13/2020, 8:46 AM

## 2020-05-13 NOTE — Progress Notes (Signed)
NAME:  [name] [name], MRN:  [id number], DOB:  1950-10-07, LOS: 1 ADMISSION DATE:  05/12/2020, CONSULTATION DATE:  5/2 REFERRING MD:  Corliss Skains, CHIEF COMPLAINT:  Left sided weakness, facial droop   History of Present Illness:  70 y/o female with multiple medical problems presented with left sided weakness and required TPA and mechanical thrombectomy.  Had a cardiac arrest immediately following her thrombectomy with CPR for < 5 minutes.    Pertinent  Medical History  Atrial fibrillation Medication non-compliance Systolic heart failure, LVEF 25-30% Hypertension Tricuspid regurgitation COVID 19 Hyperlipidemia  Significant Hospital Events: Including procedures, antibiotic start and stop dates in addition to other pertinent events   5/2 - Patient admitted to hospital Code Stroke with left sided weakness/facial droop. CTA shows vessel occlusion of the R ICA terminus, proximal M1 R MCA and proximal A1 R ACA with mild atherosclerotic disease within the carotid bifurcations and proximal ICAs.  Cleviprex and Labetolol started for Afib with RVR and HTN. Patient sent to IR for thrombectomy and was intubated for procedure. IR had succesful mechanical thrombectomy of the clot with complete vascularization.  After procedure, patient became hypotensive and went into a PEA arrest. Chest compressions were started and patient receive a total of 2 epi. Patient stabilized. Central line placed and patient placed on pressors (Vaso, Epi, Neo). Patient transported to 4N ICU. Came off vasopressors, was noted to be moving all four extremities after arrival to ICU  Interim History / Subjective:  Moving all four extremities Following commands Remains on low dose levophed Remains on mechanical ventilation  Objective   Blood pressure (!) 128/92, pulse (!) 121, temperature 97.7 F (36.5 C), temperature source Axillary, resp. rate (!) 22, weight 90.1 kg, SpO2 98 %. CVP:  [6 mmHg-18 mmHg] 14 mmHg  Vent Mode:  PRVC FiO2 (%):  [40 %-100 %] 40 % Set Rate:  [22 bmp] 22 bmp Vt Set:  [460 mL] 460 mL PEEP:  [8 cmH20-10 cmH20] 8 cmH20 Plateau Pressure:  [26 cmH20-28 cmH20] 26 cmH20   Intake/Output Summary (Last 24 hours) at 05/13/2020 4098 Last data filed at 05/13/2020 0745 Gross per 24 hour  Intake 831.14 ml  Output 545 ml  Net 286.14 ml   Filed Weights   05/12/20 1200 05/13/20 0500  Weight: 89.8 kg 90.1 kg    Examination:  General:  In bed on vent HENT: NCAT ETT in place PULM: CTA B, vent supported breathing CV: RRR, no mgr GI: BS+, soft, nontender MSK: normal bulk and tone Neuro: awake, alert, following commands, moving all four extremities   Labs/imaging that I havepersonally reviewed  (right click and "Reselect all SmartList Selections" daily)  WBC 13 Hgb 12, PLT 235 BMET: Na 134, Cr 1.2 (slightly up), AST 141/ALT 77 both up Coox 47.1 % CVP 14 CXR with bilateral air space disease  Resolved Hospital Problem list     Assessment & Plan:  Acute respiratory failure with hypoxemia> improved Acute pulmonary edema Extubate this morning  SLP evaluation after extubation Monitor O2 saturation  Cardiogenic shock, acute systolic heart failure, adequate UOP overnight Continue levophed Check COOX Monitor UOP Lasix x1 dose now Hold home b-blocker  Atrial fibrillation Non-compliance with home medicines Tele Amiodarone to continue Anticoagulation fine by PCCM, will defer to cardiology and neuro as to when to start back  Acute ischemic stroke to R common carotid artery, embolic, s/p TPA and mechanical thrombectomy with apparent good clinical result Assess PT/SLP  Secondary stroke prevention per neurology  Goal blood glucose  140-180 Goal blood pressure SBP < 180  AKI, due to cardiogenic shock Monitor BMET and UOP Replace electrolytes as needed  Goals of care: multiple advanced chronic conditions which are life threatening.  She has been hospitalized due to failure to take her  medications.  Agree with palliative care consultation to discuss goals of care    Best practice (right click and "Reselect all SmartList Selections" daily)  Diet:  NPO Pain/Anxiety/Delirium protocol (if indicated): No VAP protocol (if indicated): Not indicated DVT prophylaxis: Systemic AC GI prophylaxis: N/A Glucose control:  SSI Yes Central venous access:  Yes, and it is still needed Arterial line:  Yes, and it is still needed Foley:  Yes, and it is still needed Mobility:  bed rest  PT consulted: Yes Last date of multidisciplinary goals of care discussion [5/2: [name] [name] and daughter [name] [name]] Code Status:  full code Disposition: remain in ICU  Labs   CBC: Recent Labs  Lab 05/12/20 1220 05/12/20 1228 05/12/20 1430 05/12/20 1602 05/13/20 0527  WBC 6.2  --   --   --  13.1*  NEUTROABS 3.4  --   --   --   --   HGB 13.5 15.3* 14.3 13.6 12.3  HCT 42.8 45.0 42.0 40.0 36.8  MCV 79.6*  --   --   --  75.4*  PLT 242  --   --   --  235    Basic Metabolic Panel: Recent Labs  Lab 05/12/20 1220 05/12/20 1228 05/12/20 1430 05/12/20 1550 05/12/20 1602 05/13/20 0527  NA 135 137 136  --  136 134*  K 3.4* 3.4* 4.1  --  3.3* 3.5  CL 100 101  --   --   --  102  CO2 22  --   --   --   --  22  GLUCOSE 257* 253*  --   --   --  166*  BUN 15 17  --   --   --  20  CREATININE 1.09* 0.90  --   --   --  1.19*  CALCIUM 8.8*  --   --   --   --  8.5*  MG  --   --   --  1.4*  --  2.4  PHOS  --   --   --  4.9*  --  4.5   GFR: Estimated Creatinine Clearance: 48.2 mL/min (A) (by C-G formula based on SCr of 1.19 mg/dL (H)). Recent Labs  Lab 05/12/20 1220 05/12/20 1550 05/12/20 2156 05/13/20 0527  WBC 6.2  --   --  13.1*  LATICACIDVEN  --  3.1* 2.4*  --     Liver Function Tests: Recent Labs  Lab 05/12/20 1220 05/13/20 0527  AST 47* 141*  ALT 26 77*  ALKPHOS 106 109  BILITOT 1.0 0.8  PROT 6.4* 5.3*  ALBUMIN 2.6* 2.1*   No results for input(s): LIPASE, AMYLASE in the last 168  hours. No results for input(s): AMMONIA in the last 168 hours.  ABG    Component Value Date/Time   PHART 7.401 05/12/2020 1602   PCO2ART 37.6 05/12/2020 1602   PO2ART 349 (H) 05/12/2020 1602   HCO3 23.5 05/12/2020 1602   TCO2 25 05/12/2020 1602   ACIDBASEDEF 1.0 05/12/2020 1602   O2SAT 47.1 05/12/2020 2047     Coagulation Profile: Recent Labs  Lab 05/12/20 1220  INR 1.3*    Cardiac Enzymes: No results for input(s): CKTOTAL, CKMB, CKMBINDEX, TROPONINI in the last 168 hours.  HbA1C: HbA1c, POC (controlled diabetic range)  Date/Time Value Ref Range Status  12/30/2017 10:02 AM 9.4 (A) 0.0 - 7.0 % Final  09/06/2017 01:31 PM 8.8 (A) 0.0 - 7.0 % Final   HbA1c POC (<> result, manual entry)  Date/Time Value Ref Range Status  07/26/2019 11:08 AM >15.0 4.0 - 5.6 % Final   Hgb A1c MFr Bld  Date/Time Value Ref Range Status  05/13/2020 05:27 AM 10.5 (H) 4.8 - 5.6 % Final    Comment:    (NOTE) Pre diabetes:          5.7%-6.4%  Diabetes:              >6.4%  Glycemic control for   <7.0% adults with diabetes   02/04/2020 06:57 AM 10.4 (H) 4.8 - 5.6 % Final    Comment:    (NOTE) Pre diabetes:          5.7%-6.4%  Diabetes:              >6.4%  Glycemic control for   <7.0% adults with diabetes     CBG: Recent Labs  Lab 05/12/20 1221 05/12/20 2005 05/13/20 0003 05/13/20 0359 05/13/20 0810  GLUCAP 242* 225* 263* 174* 120*     Critical care time: 35 minutes    Heber Carbon, MD [facility] Pager: [phone] Cell: [phone] If no response, please call [phone] until 7pm After 7:00 pm call Elink  [phone]

## 2020-05-13 NOTE — Progress Notes (Signed)
STROKE TEAM PROGRESS NOTE   SUBJECTIVE (INTERVAL HISTORY) Her RN is at the bedside.  Overall her condition is completely resolved. Pt was extubated this morning, now awake alert and neuro intact. No deficit seen. However, pt continues to have afib RVR and HR at 120-130s. EF < 20% and s/p PEA arrest yesterday right after the thrombectomy. However, she looks fantastic this am after extubation.     OBJECTIVE Temp:  [97.5 F (36.4 C)-98.3 F (36.8 C)] 97.5 F (36.4 C) (05/03 0800) Pulse Rate:  [56-147] 116 (05/03 1000) Resp:  [0-36] 36 (05/03 1000) BP: (87-163)/(66-115) 122/77 (05/03 1000) SpO2:  [87 %-100 %] 98 % (05/03 1000) Arterial Line BP: (73-169)/(55-104) 109/101 (05/03 1000) FiO2 (%):  [40 %-100 %] 40 % (05/03 0820) Weight:  [89.8 kg-90.1 kg] 90.1 kg (05/03 0500)  Recent Labs  Lab 05/12/20 1221 05/12/20 2005 05/13/20 0003 05/13/20 0359 05/13/20 0810  GLUCAP 242* 225* 263* 174* 120*   Recent Labs  Lab 05/12/20 1220 05/12/20 1228 05/12/20 1430 05/12/20 1550 05/12/20 1602 05/13/20 0527  NA 135 137 136  --  136 134*  K 3.4* 3.4* 4.1  --  3.3* 3.5  CL 100 101  --   --   --  102  CO2 22  --   --   --   --  22  GLUCOSE 257* 253*  --   --   --  166*  BUN 15 17  --   --   --  20  CREATININE 1.09* 0.90  --   --   --  1.19*  CALCIUM 8.8*  --   --   --   --  8.5*  MG  --   --   --  1.4*  --  2.4  PHOS  --   --   --  4.9*  --  4.5   Recent Labs  Lab 05/12/20 1220 05/13/20 0527  AST 47* 141*  ALT 26 77*  ALKPHOS 106 109  BILITOT 1.0 0.8  PROT 6.4* 5.3*  ALBUMIN 2.6* 2.1*   Recent Labs  Lab 05/12/20 1220 05/12/20 1228 05/12/20 1430 05/12/20 1602 05/13/20 0527  WBC 6.2  --   --   --  13.1*  NEUTROABS 3.4  --   --   --   --   HGB 13.5 15.3* 14.3 13.6 12.3  HCT 42.8 45.0 42.0 40.0 36.8  MCV 79.6*  --   --   --  75.4*  PLT 242  --   --   --  235   No results for input(s): CKTOTAL, CKMB, CKMBINDEX, TROPONINI in the last 168 hours. Recent Labs     05/12/20 1220  LABPROT 16.5*  INR 1.3*   No results for input(s): COLORURINE, LABSPEC, PHURINE, GLUCOSEU, HGBUR, BILIRUBINUR, KETONESUR, PROTEINUR, UROBILINOGEN, NITRITE, LEUKOCYTESUR in the last 72 hours.  Invalid input(s): APPERANCEUR     Component Value Date/Time   CHOL 87 05/13/2020 0527   CHOL 132 06/08/2017 1110   TRIG 46 05/13/2020 0527   HDL 17 (L) 05/13/2020 0527   HDL 35 (L) 06/08/2017 1110   CHOLHDL 5.1 05/13/2020 0527   VLDL 9 05/13/2020 0527   LDLCALC 61 05/13/2020 0527   LDLCALC 67 06/08/2017 1110   Lab Results  Component Value Date   HGBA1C 10.5 (H) 05/13/2020   No results found for: LABOPIA, COCAINSCRNUR, LABBENZ, AMPHETMU, THCU, LABBARB  No results for input(s): ETH in the last 168 hours.  I have personally reviewed the radiological  images below and agree with the radiology interpretations.  DG Abd 1 View  Result Date: 05/12/2020 CLINICAL DATA:  Enteric catheter placement EXAM: ABDOMEN - 1 VIEW COMPARISON:  None. FINDINGS: Frontal view of the lower chest and upper abdomen demonstrates enteric catheter passing below diaphragm tip overlying proximal duodenum. External defibrillator pads overlie the lower chest. Bowel gas pattern is unremarkable. IMPRESSION: 1. Enteric catheter tip projecting over proximal duodenum. Electronically Signed   By: Sharlet SalinaMichael  Brown M.D.   On: 05/12/2020 16:28   CT ANGIO NECK W OR WO CONTRAST  Result Date: 05/12/2020 CLINICAL DATA:  Left-sided weakness EXAM: CT ANGIOGRAPHY HEAD AND NECK TECHNIQUE: Multidetector CT imaging of the head and neck was performed using the standard protocol during bolus administration of intravenous contrast. Multiplanar CT image reconstructions and MIPs were obtained to evaluate the vascular anatomy. Carotid stenosis measurements (when applicable) are obtained utilizing NASCET criteria, using the distal internal carotid diameter as the denominator. CONTRAST:  75mL OMNIPAQUE IOHEXOL 350 MG/ML SOLN COMPARISON:   Noncontrast head CT performed earlier today 05/12/2020. FINDINGS: CTA NECK FINDINGS Aortic arch: Standard aortic branching. No hemodynamically significant innominate or proximal subclavian artery stenosis. Right carotid system: CCA and ICA patent within the neck without significant stenosis (50% or greater). Mild soft and calcified plaque within the carotid bifurcation and proximal ICA. Asymmetrically diminished enhancement of the cervical right ICA, likely due to downstream occlusion. Left carotid system: CCA and ICA patent within the neck without significant stenosis (50% or greater). Mild soft and calcified plaque within the carotid bifurcation and proximal ICA. Vertebral arteries: Patent within the neck without stenosis. Duplicated proximal left vertebral artery. Skeleton: No acute bony abnormality or aggressive osseous lesion. Cervical spondylosis. Other neck: Asymmetric prominence of the right thyroid lobe. Underlying right thyroid lobe nodule measuring at least 2.8 cm. Upper chest: Partially imaged left pleural effusion. Review of the MIP images confirms the above findings CTA HEAD FINDINGS Anterior circulation: Asymmetrically diminished enhancement and caliber of the right ICA siphon, likely secondary to downstream occlusion. Absent enhancement within the right ICA terminus, proximal M1 right MCA and proximal A1 right ACA. Findings are compatible with T-shaped occlusion secondary to thrombus. There is some reconstitution of enhancement more distally within the M1 right MCA and within right M2 vessels. There is reconstitution of enhancement within the distal A1 right ACA. The intracranial left ICA is patent. No left M2 proximal branch occlusion or high-grade proximal stenosis is identified. Developmentally hypoplastic A1 left ACA. The A2 and more distal anterior cerebral arteries are patent. No intracranial aneurysm is identified. Posterior circulation: The intracranial vertebral arteries are patent. The  basilar artery is patent. The posterior cerebral arteries are patent. A right posterior communicating artery is present. The left posterior communicating artery is hypoplastic or absent. Venous sinuses: Within the limitations of contrast timing, no convincing thrombus. Anatomic variants: As described Review of the MIP images confirms the above findings Critical/emergent results were called by telephone at the time of interpretation on 05/12/2020 at 12:53 pm [facility] , who verbally acknowledged these results. IMPRESSION: CTA neck: 1. The common carotid, internal carotid and vertebral arteries are patent within the neck without hemodynamically significant stenosis. Mild atherosclerotic disease within the carotid bifurcations and proximal ICAs. 2. Asymmetrically diminished enhancement of the cervical right ICA, likely secondary to downstream occlusion. 3. 2.8 cm right thyroid lobe nodule. Non-emergent thyroid ultrasound is recommended for further evaluation. CTA head: T-shaped large vessel occlusion involving the right ICA terminus, proximal M1 right middle  cerebral artery and proximal A1 right anterior cerebral artery. Electronically Signed   By: Jackey Loge DO   On: 05/12/2020 13:13   DG CHEST PORT 1 VIEW  Result Date: 05/13/2020 CLINICAL DATA:  Respiratory failure.  Hypoxia. EXAM: PORTABLE CHEST 1 VIEW COMPARISON:  05/12/2020. FINDINGS: NG tube noted with tip below left hemidiaphragm. Endotracheal tube and right IJ line in stable position. Severe cardiomegaly again noted. Diffuse bilateral pulmonary infiltrates/edema noted on today's exam. Left base atelectasis. Probable moderate left pleural effusion. No pneumothorax. IMPRESSION: 1. Interim placement of NG tube. Endotracheal tube right IJ line stable position. 2.  Severe cardiomegaly again noted. 3. Diffuse bilateral pulmonary infiltrates/edema noted on today's exam. Left base atelectasis. Probable moderate left pleural effusion. Electronically  Signed   By: Maisie Fus  Register   On: 05/13/2020 05:56   Portable Chest x-ray  Result Date: 05/12/2020 CLINICAL DATA:  Stroke and respiratory failure. EXAM: PORTABLE CHEST 1 VIEW COMPARISON:  02/04/2020 FINDINGS: Stable cardiac enlargement. Endotracheal tube present with the tip approximately 3 cm above the carina. Right jugular central line present with the tip in the lower SVC. No pneumothorax. Left lower lobe atelectasis/consolidation present. No significant pulmonary edema or pleural fluid. IMPRESSION: Left lower lobe atelectasis/consolidation.  No pneumothorax. Electronically Signed   By: Irish Lack M.D.   On: 05/12/2020 16:25   ECHOCARDIOGRAM COMPLETE  Result Date: 05/12/2020    ECHOCARDIOGRAM REPORT   Patient Name:   MAKELA CARREAU Date of Exam: 05/12/2020 Medical Rec #:  [id number]         Height:       63.0 in Accession #:    [id number]        Weight:       198.0 lb Date of Birth:  09/26/1950        BSA:          1.925 m Patient Age:    69 years          BP:           223/89 mmHg Patient Gender: F                 HR:           113 bpm. Exam Location:  Inpatient Procedure: 2D Echo, Cardiac Doppler and Color Doppler STAT ECHO REPORT CONTAINS CRITICAL RESULT Indications:    Stroke.; I50.40* Unspecified combined systolic (congestive) and                 diastolic (congestive) heart failure  History:        Patient has prior history of Echocardiogram examinations. CHF,                 Abnormal ECG, Stroke, Arrythmias:Atrial Fibrillation,                 Signs/Symptoms:Hypotension, Shortness of Breath and Dyspnea;                 Risk Factors:Hypertension and Dyslipidemia. Pericardial                 effusion.  Sonographer:    [name] RDCS Referring Phys: 25 DAYNA N DUNN  Sonographer Comments: Technically difficult study due to poor echo windows, patient is morbidly obese and echo performed with patient supine and on artificial respirator. Image acquisition challenging due to patient body habitus.  IMPRESSIONS  1. Left ventricular ejection fraction, by estimation, is <20%. The left ventricle has severely decreased function. The left ventricle demonstrates  global hypokinesis. There is severe concentric left ventricular hypertrophy. Diastolic function is indeterminant due to atrial fibrillation. No LV thrombus visualized, however, apex incompletely visualized due to lack of definity contrast.  2. Right ventricular systolic function is severely reduced. The right ventricular size is mildly enlarged. There is mildly elevated pulmonary artery systolic pressure. The estimated right ventricular systolic pressure is 40.6 mmHg.  3. A large, circumferential pericardial effusion is present. The effusion appears to be greatest around the posterolateral LV measuring up to 2.2cm at end-diastole. There is no RV or RA collapse to suggest tamponade. IVC is dilated and not collapsible but patient is on the vent with significant TR, which are likely the primary drivers of IVC dilation.  4. The mitral valve is normal in structure. Mild mitral valve regurgitation.  5. Tricuspid valve regurgitation is moderate to severe.  6. The aortic valve is tricuspid. There is mild calcification of the aortic valve. There is mild thickening of the aortic valve. Aortic valve regurgitation is trivial. Mild aortic valve sclerosis is present, with no evidence of aortic valve stenosis.  7. Left atrial size was mildly dilated. Comparison(s): Compared to prior TTE in 01/2020, the LVEF is now severely reduced (<20%) and a large pericardial effusion is present (previously small). The RV systolic function now appears severely reduced. FINDINGS  Left Ventricle: Left ventricular ejection fraction, by estimation, is <20%. The left ventricle has severely decreased function. The left ventricle demonstrates global hypokinesis. The left ventricular internal cavity size was normal in size. There is severe concentric left ventricular hypertrophy. Diastolic  function is indeterminant due to atrial fibrillation. No LV thrombus visualized, however, apex incompletely visualized due to lack of definity contrast. Right Ventricle: The right ventricular size is mildly enlarged. No increase in right ventricular wall thickness. Right ventricular systolic function is severely reduced. There is mildly elevated pulmonary artery systolic pressure. The tricuspid regurgitant velocity is 2.53 m/s, and with an assumed right atrial pressure of 15 mmHg, the estimated right ventricular systolic pressure is 40.6 mmHg. Left Atrium: Left atrial size was mildly dilated. Right Atrium: Right atrial size was normal in size. Pericardium: A large, circumferential pericardial effusion is present. The effusion appears to be greatest around the posterolateral LV measuring up to 2.2cm at end-diastole. There is no RV or RA collapse to suggest tamponade. IVC is dilated and not collapsible but patient is on the vent with significant TR, which are likely the primary drivers of IVC dilation. Mitral Valve: The mitral valve is normal in structure. There is mild thickening of the mitral valve leaflet(s). There is mild calcification of the mitral valve leaflet(s). Mild mitral annular calcification. Mild mitral valve regurgitation. Tricuspid Valve: The tricuspid valve is normal in structure. Tricuspid valve regurgitation is moderate to severe. The flow in the hepatic veins is reversed during ventricular systole. Aortic Valve: The aortic valve is tricuspid. There is mild calcification of the aortic valve. There is mild thickening of the aortic valve. Aortic valve regurgitation is trivial. Mild aortic valve sclerosis is present, with no evidence of aortic valve stenosis. Pulmonic Valve: The pulmonic valve was not well visualized. Pulmonic valve regurgitation is trivial. Aorta: The aortic root and ascending aorta are structurally normal, with no evidence of dilitation. IAS/Shunts: No atrial level shunt detected by  color flow Doppler. Additional Comments: There is a small pleural effusion in the left lateral region.  LEFT VENTRICLE PLAX 2D LVIDd:         4.80 cm      Diastology  LVIDs:         4.60 cm      LV e' medial:    4.13 cm/s LV PW:         1.40 cm      LV E/e' medial:  14.9 LV IVS:        1.60 cm      LV e' lateral:   6.90 cm/s LVOT diam:     2.10 cm      LV E/e' lateral: 8.9 LV SV:         21 LV SV Index:   11 LVOT Area:     3.46 cm  LV Volumes (MOD) LV vol d, MOD A2C: 104.0 ml LV vol d, MOD A4C: 63.1 ml LV vol s, MOD A2C: 97.8 ml LV vol s, MOD A4C: 54.0 ml LV SV MOD A2C:     6.2 ml LV SV MOD A4C:     63.1 ml LV SV MOD BP:      5.5 ml RIGHT VENTRICLE            IVC RV S prime:     5.70 cm/s  IVC diam: 2.20 cm TAPSE (M-mode): 1.0 cm LEFT ATRIUM             Index       RIGHT ATRIUM           Index LA diam:        3.40 cm 1.77 cm/m  RA Area:     20.70 cm LA Vol (A2C):   53.4 ml 27.74 ml/m RA Volume:   65.50 ml  34.02 ml/m LA Vol (A4C):   60.4 ml 31.37 ml/m LA Biplane Vol: 59.7 ml 31.01 ml/m  AORTIC VALVE             PULMONIC VALVE LVOT Vmax:   58.70 cm/s  PR End Diast Vel: 2.11 msec LVOT Vmean:  34.400 cm/s LVOT VTI:    0.061 m  AORTA Ao Root diam: 3.30 cm MITRAL VALVE               TRICUSPID VALVE MV Area (PHT): 6.17 cm    TR Peak grad:   25.6 mmHg MV Decel Time: 123 msec    TR Vmax:        253.00 cm/s MV E velocity: 61.38 cm/s                            SHUNTS                            Systemic VTI:  0.06 m                            Systemic Diam: 2.10 cm Laurance Flatten MD Electronically signed by Laurance Flatten MD Signature Date/Time: 05/12/2020/7:24:37 PM    Final    CT HEAD CODE STROKE WO CONTRAST  Result Date: 05/12/2020 CLINICAL DATA:  Code stroke. Neuro deficit, acute, stroke suspected. Additional history provided: Left-sided weakness. EXAM: CT HEAD WITHOUT CONTRAST TECHNIQUE: Contiguous axial images were obtained from the base of the skull through the vertex without intravenous contrast.  COMPARISON:  Prior head CT examinations 05/10/2010. FINDINGS: Brain: Cerebral volume is normal. There is no acute intracranial hemorrhage. No demarcated cortical infarct. No extra-axial fluid collection. No evidence of intracranial mass. No midline shift. Vascular: No hyperdense  vessel.  Atherosclerotic calcifications Skull: Normal. Negative for fracture or focal lesion. Sinuses/Orbits: Redemonstrated bilateral proptosis. Rightward gaze. No significant paranasal sinus disease. ASPECTS [facility] Stroke Program Early CT Score) - Ganglionic level infarction (caudate, lentiform nuclei, internal capsule, insula, M1-M3 cortex): 7 - Supraganglionic infarction (M4-M6 cortex): 3 Total score (0-10 with 10 being normal): 10 These results were communicated to Dr. Otelia Limes at 12:40 pmon 5/2/2022by text page via the [facility] messaging system. IMPRESSION: No evidence of acute intracranial abnormality.  ASPECTS is 10. Redemonstrated bilateral proptosis. Electronically Signed   By: Jackey Loge DO   On: 05/12/2020 12:41   CT ANGIO HEAD CODE STROKE  Result Date: 05/12/2020 CLINICAL DATA:  Left-sided weakness EXAM: CT ANGIOGRAPHY HEAD AND NECK TECHNIQUE: Multidetector CT imaging of the head and neck was performed using the standard protocol during bolus administration of intravenous contrast. Multiplanar CT image reconstructions and MIPs were obtained to evaluate the vascular anatomy. Carotid stenosis measurements (when applicable) are obtained utilizing NASCET criteria, using the distal internal carotid diameter as the denominator. CONTRAST:  71mL OMNIPAQUE IOHEXOL 350 MG/ML SOLN COMPARISON:  Noncontrast head CT performed earlier today 05/12/2020. FINDINGS: CTA NECK FINDINGS Aortic arch: Standard aortic branching. No hemodynamically significant innominate or proximal subclavian artery stenosis. Right carotid system: CCA and ICA patent within the neck without significant stenosis (50% or greater). Mild soft and calcified plaque within  the carotid bifurcation and proximal ICA. Asymmetrically diminished enhancement of the cervical right ICA, likely due to downstream occlusion. Left carotid system: CCA and ICA patent within the neck without significant stenosis (50% or greater). Mild soft and calcified plaque within the carotid bifurcation and proximal ICA. Vertebral arteries: Patent within the neck without stenosis. Duplicated proximal left vertebral artery. Skeleton: No acute bony abnormality or aggressive osseous lesion. Cervical spondylosis. Other neck: Asymmetric prominence of the right thyroid lobe. Underlying right thyroid lobe nodule measuring at least 2.8 cm. Upper chest: Partially imaged left pleural effusion. Review of the MIP images confirms the above findings CTA HEAD FINDINGS Anterior circulation: Asymmetrically diminished enhancement and caliber of the right ICA siphon, likely secondary to downstream occlusion. Absent enhancement within the right ICA terminus, proximal M1 right MCA and proximal A1 right ACA. Findings are compatible with T-shaped occlusion secondary to thrombus. There is some reconstitution of enhancement more distally within the M1 right MCA and within right M2 vessels. There is reconstitution of enhancement within the distal A1 right ACA. The intracranial left ICA is patent. No left M2 proximal branch occlusion or high-grade proximal stenosis is identified. Developmentally hypoplastic A1 left ACA. The A2 and more distal anterior cerebral arteries are patent. No intracranial aneurysm is identified. Posterior circulation: The intracranial vertebral arteries are patent. The basilar artery is patent. The posterior cerebral arteries are patent. A right posterior communicating artery is present. The left posterior communicating artery is hypoplastic or absent. Venous sinuses: Within the limitations of contrast timing, no convincing thrombus. Anatomic variants: As described Review of the MIP images confirms the above  findings Critical/emergent results were called by telephone at the time of interpretation on 05/12/2020 at 12:53 pm to provider ERIC [facility] , who verbally acknowledged these results. IMPRESSION: CTA neck: 1. The common carotid, internal carotid and vertebral arteries are patent within the neck without hemodynamically significant stenosis. Mild atherosclerotic disease within the carotid bifurcations and proximal ICAs. 2. Asymmetrically diminished enhancement of the cervical right ICA, likely secondary to downstream occlusion. 3. 2.8 cm right thyroid lobe nodule. Non-emergent thyroid ultrasound is recommended for further evaluation.  CTA head: T-shaped large vessel occlusion involving the right ICA terminus, proximal M1 right middle cerebral artery and proximal A1 right anterior cerebral artery. Electronically Signed   By: Jackey Loge DO   On: 05/12/2020 13:13    PHYSICAL EXAM  Temp:  [97.5 F (36.4 C)-98.3 F (36.8 C)] 97.5 F (36.4 C) (05/03 0800) Pulse Rate:  [56-147] 116 (05/03 1000) Resp:  [0-36] 36 (05/03 1000) BP: (87-163)/(66-115) 122/77 (05/03 1000) SpO2:  [87 %-100 %] 98 % (05/03 1000) Arterial Line BP: (73-169)/(55-104) 109/101 (05/03 1000) FiO2 (%):  [40 %-100 %] 40 % (05/03 0820) Weight:  [89.8 kg-90.1 kg] 90.1 kg (05/03 0500)  General - Well nourished, well developed, in no apparent distress.  Ophthalmologic - fundi not visualized due to noncooperation.  Cardiovascular - irregularly irregular heart rate and rhythm.  Mental Status -  Level of arousal and orientation to time, place, and person were intact. Language including expression, naming, repetition, comprehension was assessed and found intact.  Cranial Nerves II - XII - II - Visual field intact OU. III, IV, VI - Extraocular movements intact. V - Facial sensation intact bilaterally. VII - questionable slight left nasolabial fold flattening vs. Poor denture. VIII - Hearing & vestibular intact bilaterally. X - Palate  elevates symmetrically. XI - Chin turning & shoulder shrug intact bilaterally. XII - Tongue protrusion intact.  Motor Strength - The patient's strength was normal in all extremities and pronator drift was absent.  Bulk was normal and fasciculations were absent.   Motor Tone - Muscle tone was assessed at the neck and appendages and was normal.  Reflexes - The patient's reflexes were symmetrical in all extremities and she had no pathological reflexes.  Sensory - Light touch, temperature/pinprick were assessed and were symmetrical.    Coordination - The patient had normal movements in the hands with no ataxia or dysmetria.  Tremor was absent.  Gait and Station - deferred.   ASSESSMENT/PLAN [name] [name] is a 70 y.o. female with history of PAF on Eliquis but stopped 1 week ago, PE, hypertension, diabetes, CHF, sick sinus syndrome, liver mass, bipolar disorder, COVID infection in 01/2020 admitted for left-sided weakness, left facial droop and slurred speech.. TPA was given.    Stroke:  right MCA several punctate infarct due to right terminal ICA occlusion status post tPA and IR with TICI3, etiology likely cardioembolic given extensive cardiac history  CT head no acute abnormality  CTA head and neck right ICA decreased flow with terminal ICA occlusion, right MCA and ACA occlusion from origin.  MRI right MCA several punctate infarcts  MRA successful vascular recanalization  2D Echo EF < 20%  LDL 61  HgbA1c 10.5  Heparin IV for VTE prophylaxis  Eliquis (apixaban) daily prior to admission, now on heparin IV. Timing to transition to po AC per HF team  Patient counseled to be compliant with her antithrombotic medications  Ongoing aggressive stroke risk factor management  Therapy recommendations:  pending  Disposition:  pending  Cardiogenic shock Cardiomyopathy PEA arrest Persistent afib  Cardiology HF team on board  Appreciate their help  Currently on amiodarone  IV  On Heparin IV   On pressor  Palliative care on board also  Diabetes  HgbA1c 10.5 goal < 7.0  Uncontrolled  Currently on lantus  CBG monitoring  SSI  DM education and close PCP follow up  Hypotension due to cardiogenic shock . Unstable . On pressor . Cardiology HF team on board  Long term BP  goal normotensive  Hyperlipidemia  Home meds:  none   LDL 61, goal < 70  Now on lipitor 80  Continue statin at discharge  Other Stroke Risk Factors  Advanced age  Obesity, Body mass index is 35.19 kg/m.   SSS  Hx of PE  Other Active Problems  Liver mass  Bipolar disorder  COVID 01/2020  Hospital day # 1  This patient is critically ill due to right MCA stroke, right ICA occlusion, status post tPA and thrombectomy, cardiogenic shock, cardiomyopathy, PEA arrest, persistent A. fib and at significant risk of neurological worsening, death form recurrent stroke, hemorrhagic conversion, bleeding from tPA, cardiac arrest, cardiogenic shock. This patient's care requires constant monitoring of vital signs, hemodynamics, respiratory and cardiac monitoring, review of multiple databases, neurological assessment, discussion with family, other specialists and medical decision making of high complexity. I spent 45 minutes of neurocritical care time in the care of this patient.  I discussed with Dr. [name] and Dr. Gala Romney.    Marvel Plan, MD PhD Stroke Neurology 05/13/2020 10:32 AM    To contact Stroke Continuity provider, please refer to [email or web address]. After hours, contact General Neurology

## 2020-05-13 NOTE — Consult Note (Signed)
Consultation Note Date: 05/13/2020   Patient Name: [name] [name]  DOB: 11-May-1950  MRN: [id number]  Age / Sex: 70 y.o., female  PCP: [name] Leitz, MD Referring Physician: Juanito Doom, MD  Reason for Consultation: Establishing goals of care and Psychosocial/spiritual support  HPI/Patient Profile: 70 y.o. female  admitted on 05/12/2020 with  multiple medical problems presented with left sided weakness  Admitted with  Code Stroke with left sided weakness/facial droop. CTA shows vessel occlusion of the R ICA terminus, proximal M1 R MCA and proximal A1 R ACA with mild atherosclerotic disease within the carotid bifurcations and proximal ICAs,  required TPA and mechanical thrombectomy.  Had a cardiac arrest immediately following her thrombectomy with CPR for < 5 minutes.  Cleviprex and Labetolol started for Afib with RVR and HTN. Patient sent to IR for thrombectomy and was intubated for procedure. IR had succesful mechanical thrombectomy of the clot with complete vascularization. After procedure, patient became hypotensive and went into a PEA arrest. Chest compressions were started and patient receive a total of 2 epi. Patient stabilized. Central line placed and patient placed on pressors (Vaso, Epi, Neo). Patient transported to 4[facility] ICU. Came off vasopressors, was noted to be moving all four extremities after arrival to ICU  Extubated successfully today.  Alert and oriented, daughter at bedside  Patient and her family face treatment  option decisions, advanced directive decision and anticiaptroy care needs     Clinical Assessment and Goals of Care:   This NP [name] [name] reviewed medical records, received report from team, assessed the patient and then meet at the patient's bedside along with her daughter/[name] [name] # [phone]  to discuss diagnosis, prognosis, GOC, EOL wishes disposition and options.    Concept of Palliative Care was introduced as specialized medical care for people and their families living with serious illness.  If focuses on providing relief from the symptoms and stress of a serious illness.  The goal is to improve quality of life for both the patient and the family.   Values and goals of care important to patient and family were attempted to be elicited.  Created space and opportunity for patient  and family to explore thoughts and feelings regarding current medical situation.  Patient and family recognize that patient has had physical and functional decline over the past many months secondary to her multiple comorbidities.  [name] [name] is her PCP.  Patient lives at [facility]. Family verbalizes concerns regarding anticipatory care needs once patient is discharged from the hospital.     A  discussion was had today regarding advanced directives.  Concepts specific to code status, artifical feeding and hydration, continued IV antibiotics and rehospitalization was had.  The difference between a aggressive medical intervention path  and a palliative comfort care path for this patient at this time was had.     Natural trajectory and expectations at EOL were discussed.  Questions and concerns addressed.  Patient  encouraged to call with questions or concerns.     PMT will continue to support holistically.      No documented HPOA or ACP documents.  Patient has 3 children who work in [employer] for decisions in patient's best interest.   Daughter/ [name] [name] # [phone] at this time is the main contact for this patient  SUMMARY OF RECOMMENDATIONS    Code Status/Advance Care Planning:  Full code  Encouraged patient/family to consider DNR/DNI status understanding evidenced based poor outcomes in similar hospitalized patient, as  the cause of arrest is likely associated with advanced chronic illness rather than an easily reversible acute cardio-pulmonary  event.   Palliative Prophylaxis:   Aspiration, Bowel Regimen, Delirium Protocol, Frequent Pain Assessment and Oral Care  Additional Recommendations (Limitations, Scope, Preferences):  Full Scope Treatment  Psycho-social/Spiritual:   Desire for further Chaplaincy support:yes   Prognosis:   Unable to determine  Discharge Planning: To Be Determined      Primary Diagnoses: Present on Admission: . Stroke (cerebrum) ([place]) . Stroke [facility])   I have reviewed the medical record, interviewed the patient and family, and examined the patient. The following aspects are pertinent.  Past Medical History:  Diagnosis Date  . Abdominal pain   . Atrial flutter ([place])   . Back pain   . Chronic combined systolic and diastolic CHF (congestive heart failure) ([place]) 02/04/2020  . Constipation   . Diabetes mellitus without complication ([place])   . Fatigue   . Full dentures   . Hemorrhoids   . History of noncompliance with medical treatment, presenting hazards to health   . Hypertension   . Lack of adequate sleep   . Liver mass   . PAF (paroxysmal atrial fibrillation) ([place])   . Rash    on right breast  . Wears glasses    Social History   Socioeconomic History  . Marital status: Single    Spouse name: Not on file  . Number of children: 3  . Years of education: Not on file  . Highest education level: High school graduate  Occupational History  . Occupation: Retired    Fish farm manager: OTHER    Comment: CNA  Tobacco Use  . Smoking status: Never Smoker  . Smokeless tobacco: Never Used  Vaping Use  . Vaping Use: Never used  Substance and Sexual Activity  . Alcohol use: No    Alcohol/week: 0.0 standard drinks  . Drug use: No  . Sexual activity: Not Currently  Other Topics Concern  . Not on file  Social History Narrative   Patient lives alone in an apartment complex for seniors.   Patient does not drive. She walks to surrounding places near her complex or her children run errands for  her. Patient never goes without.   Patient enjoys sewing, crafts, shopping, and walking.    Patient has 3 children and she is close with all of them.        Social Determinants of Health   Financial Resource Strain: Not on file  Food Insecurity: Not on file  Transportation Needs: Not on file  Physical Activity: Not on file  Stress: Not on file  Social Connections: Not on file   Family History  Problem Relation Age of Onset  . Stroke Mother   . Other Father        broken heart after spouse spouse  . Diabetes Sister   . Hypertension Sister   . Diabetes Sister   . Multiple sclerosis Sister   . Alcohol abuse Other   . Diabetes Other   . Breast cancer Neg Hx    Scheduled Meds: . amiodarone  150 mg Intravenous Once  . atorvastatin  80 mg Oral Daily  . Chlorhexidine Gluconate Cloth  6 each Topical Daily  . insulin aspart  0-15 Units Subcutaneous Q4H  . insulin glargine  18 Units Subcutaneous QHS  . pantoprazole (PROTONIX) IV  40 mg Intravenous QHS  . potassium chloride  20 mEq Oral Daily   Continuous Infusions: . amiodarone Stopped (05/13/20 1251)  .  clevidipine    . norepinephrine (LEVOPHED) Adult infusion Stopped (05/13/20 1249)   PRN Meds:.senna-docusate Medications Prior to Admission:  Prior to Admission medications   Medication Sig Start Date End Date Taking? Authorizing Provider  amiodarone (PACERONE) 200 MG tablet Take 1 tablet (200 mg total) by mouth 2 (two) times daily for 12 days. 02/07/20 02/19/20  Doristine Mango L, DO  amiodarone (PACERONE) 200 MG tablet Take 1 tablet (200 mg total) by mouth daily. 02/21/20   Anderson, Chelsey L, DO  amLODipine (NORVASC) 2.5 MG tablet Take 2.5 mg by mouth daily. 02/09/20   [provider]  B-D UF III MINI PEN NEEDLES 31G X 5 MM MISC USE WITH LANTUS PEN DAILY AS DIRECTED 02/11/20   [name] Leitz, MD  Blood Pressure KIT Monitor blood pressure twice a day 09/28/19   [name] Leitz, MD  ELIQUIS 5 MG TABS tablet TAKE 1 TABLET(5 MG)  BY MOUTH TWICE DAILY 04/10/20   Tobb, Kardie, DO  fluticasone (FLONASE) 50 MCG/ACT nasal spray Place 2 sprays into both nostrils daily. 02/09/20   [provider]  furosemide (LASIX) 40 MG tablet TAKE 1 TABLET(40 MG) BY MOUTH TWICE DAILY 03/03/20   [name] Leitz, MD  glucose blood (ACCU-CHEK AVIVA) test strip Use as instructed 06/16/17   Zenia Resides, MD  insulin glargine (LANTUS) 100 UNIT/ML Solostar Pen Inject 18 Units into the skin daily. 02/07/20   Anderson, Chelsey L, DO  Lancets (ACCU-CHEK SOFT TOUCH) lancets Once daily testing. 06/16/17   Zenia Resides, MD  Magnesium Oxide 400 MG CAPS Take 1 capsule (400 mg total) by mouth daily. 08/17/19   Richardson Dopp T, PA-C  metolazone (ZAROXOLYN) 5 MG tablet Take 30 minutes before Lasix for 7 days 03/18/20   Tobb, Kardie, DO  metoprolol succinate (TOPROL-XL) 100 MG 24 hr tablet Take 1 tablet (100 mg total) by mouth 2 (two) times daily. ** DO NOT CRUSH **  (BETA BLOCKER) 02/07/20 03/08/20  Doristine Mango L, DO  Semaglutide,0.25 or 0.5MG /DOS, (OZEMPIC, 0.25 OR 0.5 MG/DOSE,) 2 MG/1.5ML SOPN Inject 0.5 mg into the skin once a week. Patient taking differently: Inject 0.5 mg into the skin every Monday. 11/14/19   Leavy Cella, RPH-CPP   Allergies  Allergen Reactions  . Fruit & Vegetable Daily [Nutritional Supplements] Swelling and Other (See Comments)    All fruits cause tongue swelling and numbness Organic fruits are okay to eat  . Other Swelling    Tree nuts cause tongue swelling and numbness  . Peanut-Containing Drug Products Swelling and Other (See Comments)    Tongue swelling and numbness  . Aspirin Nausea And Vomiting and Other (See Comments)    Cannot take due to liver issues Other reaction(s): Unknown  . Tylenol [Acetaminophen] Other (See Comments)    Cannot take due to liver issues   Review of Systems  Neurological: Positive for weakness.    Physical Exam Constitutional:      Appearance: She is underweight.     Interventions:  Nasal cannula in place.  Cardiovascular:     Rate and Rhythm: Tachycardia present.  Pulmonary:     Effort: Tachypnea present.  Skin:    General: Skin is warm and dry.  Neurological:     Mental Status: She is alert.     Vital Signs: BP (!) 120/91   Pulse (!) 107   Temp 97.6 F (36.4 C) (Oral)   Resp (!) 30   Wt 90.1 kg   SpO2 96%   BMI 35.19 kg/m  Pain Scale: 0-10   Pain Score: 8    SpO2: SpO2: 96 % O2 Device:SpO2: 96 % O2 Flow Rate: .O2 Flow Rate (L/min): 3 L/min  IO: Intake/output summary:   Intake/Output Summary (Last 24 hours) at 05/13/2020 1519 Last data filed at 05/13/2020 1300 Gross per 24 hour  Intake 722.31 ml  Output 1010 ml  Net -287.69 ml    LBM: Last BM Date:  (pta) Baseline Weight: Weight: 89.8 kg Most recent weight: Weight: 90.1 kg      Palliative Assessment/Data:  30 % at best   Discussed with Dr Lake Bells  Time In: 1000 Time Out: 1115 Time Total: 75 minutes  Greater than 50%  of this time was spent counseling and coordinating care related to the above assessment and plan.  Signed by: [name] Lessen, NP   Please contact Palliative Medicine Team phone at [phone] for questions and concerns.  For individual provider: See Shea [name]

## 2020-05-13 NOTE — Progress Notes (Signed)
[facility]  Persistent tachycardia Resting comfortably Oliguric CVP 28 Levophed requirement 2-65mcg/min  Check coox, if low then start additional inotrope Check lactic acid Bolus amiodarone again Hold off on cardioversion unless shock abruptly worsening F/u cardiology recommendations  Heber Guinda, MD [place] Pager: [phone] Cell: [phone] If no response, please call [phone] until 7pm After 7:00 pm call Elink  [phone]

## 2020-05-13 NOTE — Progress Notes (Signed)
Patient arrived form [facility] on monitor.  Awake and alert upon arrival.  Placed on monitor and verified IV drips.  Pt. Currently on Heparin, Amiodarone, Levophed, and MIVF.  POC continued and pt oriented to room and call bell within reach.

## 2020-05-13 NOTE — Progress Notes (Signed)
SLP Cancellation Note  Patient Details Name: [name] [name] MRN: [id number] DOB: 11-Feb-1950   Cancelled treatment:       Reason Eval/Treat Not Completed: Patient not medically ready   Denim Start, Riley Nearing 05/13/2020, 8:01 AM

## 2020-05-13 NOTE — Progress Notes (Signed)
PT Cancellation Note  Patient Details Name: TELA KOTECKI MRN: [id number] DOB: [birth year]   Cancelled Treatment:    Reason Eval/Treat Not Completed: Other (comment) Pending MRI; RN requesting hold until results are read.  Lillia Pauls, PT, DPT Acute Rehabilitation Services Pager [phone] Office [phone]    Norval Morton 05/13/2020, 10:49 AM

## 2020-05-13 NOTE — Progress Notes (Signed)
Referring Physician(s): Code stoke- Kerney Elbe (neurology)  Supervising Physician: Pedro Earls  Patient Status:  [name] [name]  Chief Complaint: Stroke F/U  Subjective:  History of acute CVA s/p cerebral arteriogram with emergent mechanical thrombectomy of right ICA terminus occlusion achieving a complete recanalization via right femoral approach 05/12/2020 by Dr. Karenann Cai. Patient awake and alert sitting in bed. Follows simple commands. Daughter and RN at bedside. Moving all extremities. Right femoral puncture site c/d/i.   Allergies: Fruit & vegetable daily [nutritional supplements], Other, Peanut-containing drug products, Aspirin, and Tylenol [acetaminophen]  Medications: Prior to Admission medications   Medication Sig Start Date End Date Taking? Authorizing Provider  amiodarone (PACERONE) 200 MG tablet Take 1 tablet (200 mg total) by mouth 2 (two) times daily for 12 days. 02/07/20 02/19/20  Doristine Mango L, DO  amiodarone (PACERONE) 200 MG tablet Take 1 tablet (200 mg total) by mouth daily. 02/21/20   Anderson, Chelsey L, DO  amLODipine (NORVASC) 2.5 MG tablet Take 2.5 mg by mouth daily. 02/09/20   [provider]  B-D UF III MINI PEN NEEDLES 31G X 5 MM MISC USE WITH LANTUS PEN DAILY AS DIRECTED 02/11/20   Carollee Leitz, MD  Blood Pressure KIT Monitor blood pressure twice a day 09/28/19   Carollee Leitz, MD  ELIQUIS 5 MG TABS tablet TAKE 1 TABLET(5 MG) BY MOUTH TWICE DAILY 04/10/20   Tobb, Kardie, DO  fluticasone (FLONASE) 50 MCG/ACT nasal spray Place 2 sprays into both nostrils daily. 02/09/20   [provider]  furosemide (LASIX) 40 MG tablet TAKE 1 TABLET(40 MG) BY MOUTH TWICE DAILY 03/03/20   Carollee Leitz, MD  glucose blood (ACCU-CHEK AVIVA) test strip Use as instructed 06/16/17   Zenia Resides, MD  insulin glargine (LANTUS) 100 UNIT/ML Solostar Pen Inject 18 Units into the skin daily. 02/07/20   Anderson, Chelsey L, DO  Lancets  (ACCU-CHEK SOFT TOUCH) lancets Once daily testing. 06/16/17   Zenia Resides, MD  Magnesium Oxide 400 MG CAPS Take 1 capsule (400 mg total) by mouth daily. 08/17/19   Richardson Dopp T, PA-C  metolazone (ZAROXOLYN) 5 MG tablet Take 30 minutes before Lasix for 7 days 03/18/20   Tobb, Kardie, DO  metoprolol succinate (TOPROL-XL) 100 MG 24 hr tablet Take 1 tablet (100 mg total) by mouth 2 (two) times daily. ** DO NOT CRUSH **  (BETA BLOCKER) 02/07/20 03/08/20  Anderson, Chelsey L, DO  Semaglutide,0.25 or 0.5MG /DOS, (OZEMPIC, 0.25 OR 0.5 MG/DOSE,) 2 MG/1.5ML SOPN Inject 0.5 mg into the skin once a week. Patient taking differently: Inject 0.5 mg into the skin every Monday. 11/14/19   Leavy Cella, RPH-CPP     Vital Signs: BP 122/77   Pulse (!) 116   Temp (!) 97.5 F (36.4 C) (Axillary)   Resp (!) 36   Wt 198 lb 10.2 oz (90.1 kg)   SpO2 98%   BMI 35.19 kg/m   Physical Exam Vitals and nursing note reviewed.  Constitutional:      General: [name] [name] is not in acute distress. Pulmonary:     Effort: Pulmonary effort is normal. No respiratory distress.  Skin:    General: Skin is warm and dry.     Comments: Right femoral puncture site soft without active bleeding or hematoma.  Neurological:     Mental Status: [name] [name] is alert.     Comments: Alert, awake, and oriented x3. Speech and comprehension intact. PERRL bilaterally. No facial asymmetry. Tongue midline. Can  spontaneously move all extremities. No pronator drift. Distal pulses (DPs) dopplerable bilaterally.     Imaging: DG Abd 1 View  Result Date: 05/12/2020 CLINICAL DATA:  Enteric catheter placement EXAM: ABDOMEN - 1 VIEW COMPARISON:  None. FINDINGS: Frontal view of the lower chest and upper abdomen demonstrates enteric catheter passing below diaphragm tip overlying proximal duodenum. External defibrillator pads overlie the lower chest. Bowel gas pattern is unremarkable. IMPRESSION: 1. Enteric catheter tip projecting over proximal duodenum.  Electronically Signed   By: Randa Ngo M.D.   On: 05/12/2020 16:28   CT ANGIO NECK W OR WO CONTRAST  Result Date: 05/12/2020 CLINICAL DATA:  Left-sided weakness EXAM: CT ANGIOGRAPHY HEAD AND NECK TECHNIQUE: Multidetector CT imaging of the head and neck was performed using the standard protocol during bolus administration of intravenous contrast. Multiplanar CT image reconstructions and MIPs were obtained to evaluate the vascular anatomy. Carotid stenosis measurements (when applicable) are obtained utilizing NASCET criteria, using the distal internal carotid diameter as the denominator. CONTRAST:  70mL OMNIPAQUE IOHEXOL 350 MG/ML SOLN COMPARISON:  Noncontrast head CT performed earlier today 05/12/2020. FINDINGS: CTA NECK FINDINGS Aortic arch: Standard aortic branching. No hemodynamically significant innominate or proximal subclavian artery stenosis. Right carotid system: CCA and ICA patent within the neck without significant stenosis (50% or greater). Mild soft and calcified plaque within the carotid bifurcation and proximal ICA. Asymmetrically diminished enhancement of the cervical right ICA, likely due to downstream occlusion. Left carotid system: CCA and ICA patent within the neck without significant stenosis (50% or greater). Mild soft and calcified plaque within the carotid bifurcation and proximal ICA. Vertebral arteries: Patent within the neck without stenosis. Duplicated proximal left vertebral artery. Skeleton: No acute bony abnormality or aggressive osseous lesion. Cervical spondylosis. Other neck: Asymmetric prominence of the right thyroid lobe. Underlying right thyroid lobe nodule measuring at least 2.8 cm. Upper chest: Partially imaged left pleural effusion. Review of the MIP images confirms the above findings CTA HEAD FINDINGS Anterior circulation: Asymmetrically diminished enhancement and caliber of the right ICA siphon, likely secondary to downstream occlusion. Absent enhancement within the  right ICA terminus, proximal M1 right MCA and proximal A1 right ACA. Findings are compatible with T-shaped occlusion secondary to thrombus. There is some reconstitution of enhancement more distally within the M1 right MCA and within right M2 vessels. There is reconstitution of enhancement within the distal A1 right ACA. The intracranial left ICA is patent. No left M2 proximal branch occlusion or high-grade proximal stenosis is identified. Developmentally hypoplastic A1 left ACA. The A2 and more distal anterior cerebral arteries are patent. No intracranial aneurysm is identified. Posterior circulation: The intracranial vertebral arteries are patent. The basilar artery is patent. The posterior cerebral arteries are patent. A right posterior communicating artery is present. The left posterior communicating artery is hypoplastic or absent. Venous sinuses: Within the limitations of contrast timing, no convincing thrombus. Anatomic variants: As described Review of the MIP images confirms the above findings Critical/emergent results were called by telephone at the time of interpretation on 05/12/2020 at 12:53 pm to provider ERIC [facility] , who verbally acknowledged these results. IMPRESSION: CTA neck: 1. The common carotid, internal carotid and vertebral arteries are patent within the neck without hemodynamically significant stenosis. Mild atherosclerotic disease within the carotid bifurcations and proximal ICAs. 2. Asymmetrically diminished enhancement of the cervical right ICA, likely secondary to downstream occlusion. 3. 2.8 cm right thyroid lobe nodule. Non-emergent thyroid ultrasound is recommended for further evaluation. CTA head: T-shaped large vessel occlusion involving  the right ICA terminus, proximal M1 right middle cerebral artery and proximal A1 right anterior cerebral artery. Electronically Signed   By: Kellie Simmering DO   On: 05/12/2020 13:13   DG CHEST PORT 1 VIEW  Result Date: 05/13/2020 CLINICAL DATA:   Respiratory failure.  Hypoxia. EXAM: PORTABLE CHEST 1 VIEW COMPARISON:  05/12/2020. FINDINGS: NG tube noted with tip below left hemidiaphragm. Endotracheal tube and right IJ line in stable position. Severe cardiomegaly again noted. Diffuse bilateral pulmonary infiltrates/edema noted on today's exam. Left base atelectasis. Probable moderate left pleural effusion. No pneumothorax. IMPRESSION: 1. Interim placement of NG tube. Endotracheal tube right IJ line stable position. 2.  Severe cardiomegaly again noted. 3. Diffuse bilateral pulmonary infiltrates/edema noted on today's exam. Left base atelectasis. Probable moderate left pleural effusion. Electronically Signed   By: Marcello Moores  Register   On: 05/13/2020 05:56   Portable Chest x-ray  Result Date: 05/12/2020 CLINICAL DATA:  Stroke and respiratory failure. EXAM: PORTABLE CHEST 1 VIEW COMPARISON:  02/04/2020 FINDINGS: Stable cardiac enlargement. Endotracheal tube present with the tip approximately 3 cm above the carina. Right jugular central line present with the tip in the lower SVC. No pneumothorax. Left lower lobe atelectasis/consolidation present. No significant pulmonary edema or pleural fluid. IMPRESSION: Left lower lobe atelectasis/consolidation.  No pneumothorax. Electronically Signed   By: Aletta Edouard M.D.   On: 05/12/2020 16:25   ECHOCARDIOGRAM COMPLETE  Result Date: 05/12/2020    ECHOCARDIOGRAM REPORT   Patient Name:   [name] [name] Date of Exam: 05/12/2020 Medical Rec #:  [id number]         Height:       63.0 in Accession #:    [id number]        Weight:       198.0 lb Date of Birth:  1950-10-03        BSA:          1.925 m Patient Age:    70 years          BP:           223/89 mmHg Patient Gender: F                 HR:           113 bpm. Exam Location:  Inpatient Procedure: 2D Echo, Cardiac Doppler and Color Doppler STAT ECHO REPORT CONTAINS CRITICAL RESULT Indications:    Stroke.; I50.40* Unspecified combined systolic (congestive) and                  diastolic (congestive) heart failure  History:        Patient has prior history of Echocardiogram examinations. CHF,                 Abnormal ECG, Stroke, Arrythmias:Atrial Fibrillation,                 Signs/Symptoms:Hypotension, Shortness of Breath and Dyspnea;                 Risk Factors:Hypertension and Dyslipidemia. Pericardial                 effusion.  Sonographer:    Roseanna Rainbow RDCS Referring Phys: 47 DAYNA N DUNN  Sonographer Comments: Technically difficult study due to poor echo windows, patient is morbidly obese and echo performed with patient supine and on artificial respirator. Image acquisition challenging due to patient body habitus. IMPRESSIONS  1. Left ventricular ejection fraction, by estimation, is <20%. The left ventricle  has severely decreased function. The left ventricle demonstrates global hypokinesis. There is severe concentric left ventricular hypertrophy. Diastolic function is indeterminant due to atrial fibrillation. No LV thrombus visualized, however, apex incompletely visualized due to lack of definity contrast.  2. Right ventricular systolic function is severely reduced. The right ventricular size is mildly enlarged. There is mildly elevated pulmonary artery systolic pressure. The estimated right ventricular systolic pressure is 88.5 mmHg.  3. A large, circumferential pericardial effusion is present. The effusion appears to be greatest around the posterolateral LV measuring up to 2.2cm at end-diastole. There is no RV or RA collapse to suggest tamponade. IVC is dilated and not collapsible but patient is on the vent with significant TR, which are likely the primary drivers of IVC dilation.  4. The mitral valve is normal in structure. Mild mitral valve regurgitation.  5. Tricuspid valve regurgitation is moderate to severe.  6. The aortic valve is tricuspid. There is mild calcification of the aortic valve. There is mild thickening of the aortic valve. Aortic valve regurgitation is  trivial. Mild aortic valve sclerosis is present, with no evidence of aortic valve stenosis.  7. Left atrial size was mildly dilated. Comparison(s): Compared to prior TTE in 01/2020, the LVEF is now severely reduced (<20%) and a large pericardial effusion is present (previously small). The RV systolic function now appears severely reduced. FINDINGS  Left Ventricle: Left ventricular ejection fraction, by estimation, is <20%. The left ventricle has severely decreased function. The left ventricle demonstrates global hypokinesis. The left ventricular internal cavity size was normal in size. There is severe concentric left ventricular hypertrophy. Diastolic function is indeterminant due to atrial fibrillation. No LV thrombus visualized, however, apex incompletely visualized due to lack of definity contrast. Right Ventricle: The right ventricular size is mildly enlarged. No increase in right ventricular wall thickness. Right ventricular systolic function is severely reduced. There is mildly elevated pulmonary artery systolic pressure. The tricuspid regurgitant velocity is 2.53 m/s, and with an assumed right atrial pressure of 15 mmHg, the estimated right ventricular systolic pressure is 02.7 mmHg. Left Atrium: Left atrial size was mildly dilated. Right Atrium: Right atrial size was normal in size. Pericardium: A large, circumferential pericardial effusion is present. The effusion appears to be greatest around the posterolateral LV measuring up to 2.2cm at end-diastole. There is no RV or RA collapse to suggest tamponade. IVC is dilated and not collapsible but patient is on the vent with significant TR, which are likely the primary drivers of IVC dilation. Mitral Valve: The mitral valve is normal in structure. There is mild thickening of the mitral valve leaflet(s). There is mild calcification of the mitral valve leaflet(s). Mild mitral annular calcification. Mild mitral valve regurgitation. Tricuspid Valve: The tricuspid  valve is normal in structure. Tricuspid valve regurgitation is moderate to severe. The flow in the hepatic veins is reversed during ventricular systole. Aortic Valve: The aortic valve is tricuspid. There is mild calcification of the aortic valve. There is mild thickening of the aortic valve. Aortic valve regurgitation is trivial. Mild aortic valve sclerosis is present, with no evidence of aortic valve stenosis. Pulmonic Valve: The pulmonic valve was not well visualized. Pulmonic valve regurgitation is trivial. Aorta: The aortic root and ascending aorta are structurally normal, with no evidence of dilitation. IAS/Shunts: No atrial level shunt detected by color flow Doppler. Additional Comments: There is a small pleural effusion in the left lateral region.  LEFT VENTRICLE PLAX 2D LVIDd:  4.80 cm      Diastology LVIDs:         4.60 cm      LV e' medial:    4.13 cm/s LV PW:         1.40 cm      LV E/e' medial:  14.9 LV IVS:        1.60 cm      LV e' lateral:   6.90 cm/s LVOT diam:     2.10 cm      LV E/e' lateral: 8.9 LV SV:         21 LV SV Index:   11 LVOT Area:     3.46 cm  LV Volumes (MOD) LV vol d, MOD A2C: 104.0 ml LV vol d, MOD A4C: 63.1 ml LV vol s, MOD A2C: 97.8 ml LV vol s, MOD A4C: 54.0 ml LV SV MOD A2C:     6.2 ml LV SV MOD A4C:     63.1 ml LV SV MOD BP:      5.5 ml RIGHT VENTRICLE            IVC RV S prime:     5.70 cm/s  IVC diam: 2.20 cm TAPSE (M-mode): 1.0 cm LEFT ATRIUM             Index       RIGHT ATRIUM           Index LA diam:        3.40 cm 1.77 cm/m  RA Area:     20.70 cm LA Vol (A2C):   53.4 ml 27.74 ml/m RA Volume:   65.50 ml  34.02 ml/m LA Vol (A4C):   60.4 ml 31.37 ml/m LA Biplane Vol: 59.7 ml 31.01 ml/m  AORTIC VALVE             PULMONIC VALVE LVOT Vmax:   58.70 cm/s  PR End Diast Vel: 2.11 msec LVOT Vmean:  34.400 cm/s LVOT VTI:    0.061 m  AORTA Ao Root diam: 3.30 cm MITRAL VALVE               TRICUSPID VALVE MV Area (PHT): 6.17 cm    TR Peak grad:   25.6 mmHg MV Decel Time:  123 msec    TR Vmax:        253.00 cm/s MV E velocity: 61.38 cm/s                            SHUNTS                            Systemic VTI:  0.06 m                            Systemic Diam: 2.10 cm Gwyndolyn Kaufman MD Electronically signed by Gwyndolyn Kaufman MD Signature Date/Time: 05/12/2020/7:24:37 PM    Final    CT HEAD CODE STROKE WO CONTRAST  Result Date: 05/12/2020 CLINICAL DATA:  Code stroke. Neuro deficit, acute, stroke suspected. Additional history provided: Left-sided weakness. EXAM: CT HEAD WITHOUT CONTRAST TECHNIQUE: Contiguous axial images were obtained from the base of the skull through the vertex without intravenous contrast. COMPARISON:  Prior head CT examinations 05/10/2010. FINDINGS: Brain: Cerebral volume is normal. There is no acute intracranial hemorrhage. No demarcated cortical infarct. No extra-axial fluid collection. No evidence of  intracranial mass. No midline shift. Vascular: No hyperdense vessel.  Atherosclerotic calcifications Skull: Normal. Negative for fracture or focal lesion. Sinuses/Orbits: Redemonstrated bilateral proptosis. Rightward gaze. No significant paranasal sinus disease. ASPECTS [facility] Stroke Program Early CT Score) - Ganglionic level infarction (caudate, lentiform nuclei, internal capsule, insula, M1-M3 cortex): 7 - Supraganglionic infarction (M4-M6 cortex): 3 Total score (0-10 with 10 being normal): 10 These results were communicated to Dr. Cheral Marker at 12:40 pmon 5/2/2022by text page via the [facility] messaging system. IMPRESSION: No evidence of acute intracranial abnormality.  ASPECTS is 10. Redemonstrated bilateral proptosis. Electronically Signed   By: Kellie Simmering DO   On: 05/12/2020 12:41   CT ANGIO HEAD CODE STROKE  Result Date: 05/12/2020 CLINICAL DATA:  Left-sided weakness EXAM: CT ANGIOGRAPHY HEAD AND NECK TECHNIQUE: Multidetector CT imaging of the head and neck was performed using the standard protocol during bolus administration of intravenous contrast.  Multiplanar CT image reconstructions and MIPs were obtained to evaluate the vascular anatomy. Carotid stenosis measurements (when applicable) are obtained utilizing NASCET criteria, using the distal internal carotid diameter as the denominator. CONTRAST:  35mL OMNIPAQUE IOHEXOL 350 MG/ML SOLN COMPARISON:  Noncontrast head CT performed earlier today 05/12/2020. FINDINGS: CTA NECK FINDINGS Aortic arch: Standard aortic branching. No hemodynamically significant innominate or proximal subclavian artery stenosis. Right carotid system: CCA and ICA patent within the neck without significant stenosis (50% or greater). Mild soft and calcified plaque within the carotid bifurcation and proximal ICA. Asymmetrically diminished enhancement of the cervical right ICA, likely due to downstream occlusion. Left carotid system: CCA and ICA patent within the neck without significant stenosis (50% or greater). Mild soft and calcified plaque within the carotid bifurcation and proximal ICA. Vertebral arteries: Patent within the neck without stenosis. Duplicated proximal left vertebral artery. Skeleton: No acute bony abnormality or aggressive osseous lesion. Cervical spondylosis. Other neck: Asymmetric prominence of the right thyroid lobe. Underlying right thyroid lobe nodule measuring at least 2.8 cm. Upper chest: Partially imaged left pleural effusion. Review of the MIP images confirms the above findings CTA HEAD FINDINGS Anterior circulation: Asymmetrically diminished enhancement and caliber of the right ICA siphon, likely secondary to downstream occlusion. Absent enhancement within the right ICA terminus, proximal M1 right MCA and proximal A1 right ACA. Findings are compatible with T-shaped occlusion secondary to thrombus. There is some reconstitution of enhancement more distally within the M1 right MCA and within right M2 vessels. There is reconstitution of enhancement within the distal A1 right ACA. The intracranial left ICA is patent.  No left M2 proximal branch occlusion or high-grade proximal stenosis is identified. Developmentally hypoplastic A1 left ACA. The A2 and more distal anterior cerebral arteries are patent. No intracranial aneurysm is identified. Posterior circulation: The intracranial vertebral arteries are patent. The basilar artery is patent. The posterior cerebral arteries are patent. A right posterior communicating artery is present. The left posterior communicating artery is hypoplastic or absent. Venous sinuses: Within the limitations of contrast timing, no convincing thrombus. Anatomic variants: As described Review of the MIP images confirms the above findings Critical/emergent results were called by telephone at the time of interpretation on 05/12/2020 at 12:53 pm to provider ERIC [facility] , who verbally acknowledged these results. IMPRESSION: CTA neck: 1. The common carotid, internal carotid and vertebral arteries are patent within the neck without hemodynamically significant stenosis. Mild atherosclerotic disease within the carotid bifurcations and proximal ICAs. 2. Asymmetrically diminished enhancement of the cervical right ICA, likely secondary to downstream occlusion. 3. 2.8 cm right thyroid lobe nodule.  Non-emergent thyroid ultrasound is recommended for further evaluation. CTA head: T-shaped large vessel occlusion involving the right ICA terminus, proximal M1 right middle cerebral artery and proximal A1 right anterior cerebral artery. Electronically Signed   By: Kellie Simmering DO   On: 05/12/2020 13:13    Labs:  CBC: Recent Labs    02/07/20 0251 03/03/20 1559 05/12/20 1220 05/12/20 1228 05/12/20 1430 05/12/20 1602 05/13/20 0527  WBC 4.9 7.0 6.2  --   --   --  13.1*  HGB 13.6 14.0 13.5 15.3* 14.3 13.6 12.3  HCT 44.4 45.6 42.8 45.0 42.0 40.0 36.8  PLT 244 281 242  --   --   --  235    COAGS: Recent Labs    07/31/19 0255 05/12/20 1220  INR 1.3* 1.3*  APTT  --  26    BMP: Recent Labs     08/31/19 0948 11/09/19 1201 02/03/20 1445 03/03/20 1559 03/05/20 1524 03/18/20 1645 05/12/20 1220 05/12/20 1228 05/12/20 1430 05/12/20 1602 05/13/20 0527  NA 142 145*   < > 138 139 142 135 137 136 136 134*  K 4.4 4.5   < > 5.1 4.8 4.7 3.4* 3.4* 4.1 3.3* 3.5  CL 102 105   < > 104 102 100 100 101  --   --  102  CO2 28 27   < > $R'21 24 25 22  'xB$ --   --   --  22  GLUCOSE 100* 139*   < > 161* 197* 239* 257* 253*  --   --  166*  BUN 13 13   < > $R'16 17 16 15 17  'vn$ --   --  20  CALCIUM 9.7 9.6   < > 9.1 8.9 9.0 8.8*  --   --   --  8.5*  CREATININE 0.77 0.80   < > 0.96 1.08* 1.02* 1.09* 0.90  --   --  1.19*  GFRNONAA 80 77   < > 61 53*  --  55*  --   --   --  50*  GFRAA 92 88  --  70 61  --   --   --   --   --   --    < > = values in this interval not displayed.    LIVER FUNCTION TESTS: Recent Labs    02/06/20 0110 02/07/20 0251 05/12/20 1220 05/13/20 0527  BILITOT 0.5 0.5 1.0 0.8  AST 30 28 47* 141*  ALT 46* 44 26 77*  ALKPHOS 76 73 106 109  PROT 6.1* 6.0* 6.4* 5.3*  ALBUMIN 2.3* 2.3* 2.6* 2.1*    Assessment and Plan:  History of acute CVA s/p cerebral arteriogram with emergent mechanical thrombectomy of right ICA terminus occlusion achieving a complete recanalization via right femoral approach 05/12/2020 by Dr. Karenann Cai. Patient awake/alert and following simple commands, moving all extremities. Right femoral puncture site stable, distal pulses (DPs) dopplerable bilaterally. For MR/MRA brain/head today. Further plans per CCM/neurology/cardiology- appreciate and agree with management. Please call NIR with questions/concerns.   Electronically Signed: Earley Abide, PA-C 05/13/2020, 10:23 AM   I spent a total of 15 Minutes at the the patient's bedside AND on the patient's hospital floor or unit, greater than 50% of which was counseling/coordinating care for CVA s/p revascularization.

## 2020-05-14 ENCOUNTER — Inpatient Hospital Stay (HOSPITAL_COMMUNITY): Payer: Medicare Other

## 2020-05-14 DIAGNOSIS — I469 Cardiac arrest, cause unspecified: Secondary | ICD-10-CM

## 2020-05-14 DIAGNOSIS — I639 Cerebral infarction, unspecified: Secondary | ICD-10-CM | POA: Diagnosis not present

## 2020-05-14 LAB — GLUCOSE, CAPILLARY
Glucose-Capillary: 119 mg/dL — ABNORMAL HIGH (ref 70–99)
Glucose-Capillary: 121 mg/dL — ABNORMAL HIGH (ref 70–99)
Glucose-Capillary: 122 mg/dL — ABNORMAL HIGH (ref 70–99)
Glucose-Capillary: 132 mg/dL — ABNORMAL HIGH (ref 70–99)
Glucose-Capillary: 144 mg/dL — ABNORMAL HIGH (ref 70–99)
Glucose-Capillary: 79 mg/dL (ref 70–99)

## 2020-05-14 LAB — COMPREHENSIVE METABOLIC PANEL
ALT: 87 U/L — ABNORMAL HIGH (ref 0–44)
AST: 109 U/L — ABNORMAL HIGH (ref 15–41)
Albumin: 2.1 g/dL — ABNORMAL LOW (ref 3.5–5.0)
Alkaline Phosphatase: 133 U/L — ABNORMAL HIGH (ref 38–126)
Anion gap: 9 (ref 5–15)
BUN: 23 mg/dL (ref 8–23)
CO2: 26 mmol/L (ref 22–32)
Calcium: 8 mg/dL — ABNORMAL LOW (ref 8.9–10.3)
Chloride: 100 mmol/L (ref 98–111)
Creatinine, Ser: 1.28 mg/dL — ABNORMAL HIGH (ref 0.44–1.00)
GFR, Estimated: 46 mL/min — ABNORMAL LOW (ref >60)
Glucose, Bld: 99 mg/dL (ref 70–99)
Potassium: 3.1 mmol/L — ABNORMAL LOW (ref 3.5–5.1)
Sodium: 135 mmol/L (ref 135–145)
Total Bilirubin: 0.9 mg/dL (ref 0.3–1.2)
Total Protein: 5.1 g/dL — ABNORMAL LOW (ref 6.5–8.1)

## 2020-05-14 LAB — CBC WITH DIFFERENTIAL/PLATELET
Abs Immature Granulocytes: 0.12 10*3/uL — ABNORMAL HIGH (ref 0.00–0.07)
Basophils Absolute: 0 10*3/uL (ref 0.0–0.1)
Basophils Relative: 0 %
Eosinophils Absolute: 0 10*3/uL (ref 0.0–0.5)
Eosinophils Relative: 0 %
HCT: 33.6 % — ABNORMAL LOW (ref 36.0–46.0)
Hemoglobin: 11.2 g/dL — ABNORMAL LOW (ref 12.0–15.0)
Immature Granulocytes: 1 %
Lymphocytes Relative: 6 %
Lymphs Abs: 1.2 10*3/uL (ref 0.7–4.0)
MCH: 25.3 pg — ABNORMAL LOW (ref 26.0–34.0)
MCHC: 33.3 g/dL (ref 30.0–36.0)
MCV: 75.8 fL — ABNORMAL LOW (ref 80.0–100.0)
Monocytes Absolute: 1.6 10*3/uL — ABNORMAL HIGH (ref 0.1–1.0)
Monocytes Relative: 8 %
Neutro Abs: 15.5 10*3/uL — ABNORMAL HIGH (ref 1.7–7.7)
Neutrophils Relative %: 85 %
Platelets: 203 10*3/uL (ref 150–400)
RBC: 4.43 MIL/uL (ref 3.87–5.11)
RDW: 15.2 % (ref 11.5–15.5)
WBC: 18.4 10*3/uL — ABNORMAL HIGH (ref 4.0–10.5)
nRBC: 0 % (ref 0.0–0.2)

## 2020-05-14 LAB — APTT: aPTT: 92 seconds — ABNORMAL HIGH (ref 24–36)

## 2020-05-14 LAB — COOXEMETRY PANEL
Carboxyhemoglobin: 0.8 % (ref 0.5–1.5)
Methemoglobin: 0.7 % (ref 0.0–1.5)
O2 Saturation: 56.4 %
Total hemoglobin: 10.3 g/dL — ABNORMAL LOW (ref 12.0–16.0)

## 2020-05-14 LAB — HEPARIN LEVEL (UNFRACTIONATED)
Heparin Unfractionated: 0.2 IU/mL — ABNORMAL LOW (ref 0.30–0.70)
Heparin Unfractionated: 0.13 IU/mL — ABNORMAL LOW (ref 0.30–0.70)

## 2020-05-14 LAB — URINALYSIS, ROUTINE W REFLEX MICROSCOPIC
Bilirubin Urine: NEGATIVE
Glucose, UA: NEGATIVE mg/dL
Ketones, ur: NEGATIVE mg/dL
Leukocytes,Ua: NEGATIVE
Nitrite: NEGATIVE
Protein, ur: NEGATIVE mg/dL
Specific Gravity, Urine: 1.012 (ref 1.005–1.030)
pH: 5 (ref 5.0–8.0)

## 2020-05-14 LAB — PROCALCITONIN: Procalcitonin: 2.75 ng/mL

## 2020-05-14 MED ORDER — LIDOCAINE 5 % EX PTCH
1.0000 | MEDICATED_PATCH | CUTANEOUS | Status: DC
  Administered 2020-05-14 – 2020-05-24 (×11): 1 via TRANSDERMAL
  Filled 2020-05-14 (×12): qty 1

## 2020-05-14 MED ORDER — POTASSIUM CHLORIDE 10 MEQ/50ML IV SOLN
10.0000 meq | INTRAVENOUS | Status: AC
  Administered 2020-05-14 (×4): 10 meq via INTRAVENOUS
  Filled 2020-05-14 (×4): qty 50

## 2020-05-14 MED ORDER — POTASSIUM CHLORIDE CRYS ER 20 MEQ PO TBCR
40.0000 meq | EXTENDED_RELEASE_TABLET | ORAL | Status: AC
  Administered 2020-05-14 (×2): 40 meq via ORAL
  Filled 2020-05-14 (×2): qty 2

## 2020-05-14 MED ORDER — POTASSIUM CHLORIDE 10 MEQ/100ML IV SOLN: 10.0000 meq | INTRAVENOUS | Status: DC

## 2020-05-14 MED ORDER — POTASSIUM CHLORIDE 20 MEQ PO PACK
40.0000 meq | PACK | Freq: Every day | ORAL | Status: DC
  Administered 2020-05-15 – 2020-05-18 (×4): 40 meq via ORAL
  Filled 2020-05-14 (×5): qty 2

## 2020-05-14 MED ORDER — SODIUM CHLORIDE 0.9 % IV SOLN: INTRAVENOUS | Status: DC | PRN

## 2020-05-14 MED ORDER — ONDANSETRON HCL 4 MG/2ML IJ SOLN
4.0000 mg | Freq: Once | INTRAMUSCULAR | Status: AC
  Administered 2020-05-14: 4 mg via INTRAVENOUS
  Filled 2020-05-14: qty 2

## 2020-05-14 MED ORDER — OXYCODONE HCL 5 MG PO TABS
5.0000 mg | ORAL_TABLET | ORAL | Status: DC | PRN
  Administered 2020-05-14 – 2020-05-26 (×43): 5 mg via ORAL
  Filled 2020-05-14 (×43): qty 1

## 2020-05-14 NOTE — Evaluation (Signed)
Occupational Therapy Evaluation Patient Details Name: [name] [name] MRN: [id number] DOB: 07-Jun-1950 Today's Date: 05/14/2020    History of Present Illness 70 yo female admitted 05/12/20 with L side weakness s/p TPA with mechanical thrombectomy followed by cardiac arrest receiving CPR for <5 minutes. CTA R MCA occlusion with successful revascualarization intubated 5/2 extubated 5/3 8:40am PMH DM chronic combined HF HTN afib 01/2020 COVID with afib rvr   Clinical Impression   Pt was independent in self care and mobility prior to admission. Presents with pain under R breast she describes as heaviness, generalized weakness and impaired standing balance. She stood from recliner with min guard assist and ambulated with RW and min assist. Pt needs set up to max assist for ADL. Recommending SNF for ST rehab, but anticipate pt will progress steadily.      Follow Up Recommendations  SNF (may progress well and not require)    Equipment Recommendations  3 in 1 bedside commode    Recommendations for Other Services       Precautions / Restrictions Precautions Precautions: Fall      Mobility Bed Mobility      General bed mobility comments: pt in chair    Transfers Overall transfer level: Needs assistance Equipment used: Rolling walker (2 wheeled) Transfers: Sit to/from Stand Sit to Stand: Min guard        General transfer comment: cues for hand placement, increased time    Balance Overall balance assessment: Needs assistance;Mild deficits observed, not formally tested   Sitting balance-Leahy Scale: Fair       Standing balance-Leahy Scale: Poor Standing balance comment: reliant on at least one hand support in static standing, B UE for dynamic                           ADL either performed or assessed with clinical judgement   ADL Overall ADL's : Needs assistance/impaired Eating/Feeding: Independent;Sitting   Grooming: Set up;Sitting   Upper Body Bathing:  Minimal assistance;Sitting   Lower Body Bathing: Maximal assistance;Sit to/from stand   Upper Body Dressing : Minimal assistance;Sitting   Lower Body Dressing: Maximal assistance;Sit to/from stand   Toilet Transfer: Minimal assistance;Ambulation;RW;BSC   Toileting- Clothing Manipulation and Hygiene: Minimal assistance;Sit to/from stand       Functional mobility during ADLs: Minimal assistance;Rolling walker       Vision Baseline Vision/History: Wears glasses Wears Glasses: At all times Patient Visual Report: No change from baseline       Perception     Praxis      Pertinent Vitals/Pain Pain Assessment: 0-10 Pain Score: 8  Faces Pain Scale: Hurts little more Pain Location: R breast/rib cage Pain Descriptors / Indicators: Heaviness Pain Intervention(s): Monitored during session;Repositioned;Premedicated before session     Hand Dominance Right   Extremity/Trunk Assessment Upper Extremity Assessment Upper Extremity Assessment: Generalized weakness;RUE deficits/detail RUE Deficits / Details: reluctant to use due to pain under breast, but demonstrating 4/5 strength   Lower Extremity Assessment Lower Extremity Assessment: Defer to PT evaluation       Communication Communication Communication: No difficulties   Cognition Arousal/Alertness: Awake/alert Behavior During Therapy: Flat affect Overall Cognitive Status: No family/caregiver present to determine baseline cognitive functioning                                 General Comments: pt with h/o medical non compliance, able to offer detailed  events leading up to admission, tangential at times.   General Comments  Sats on 2L once up slowly dropped to 86% and rose to 90/91% on 3L, HR during minimal activity at mid to upper 90's with spike into 100's bpm    Exercises     Shoulder Instructions      Home Living Family/patient expects to be discharged to:: Private residence Living Arrangements:  Alone Available Help at Discharge: Family;Available PRN/intermittently Type of Home: Apartment Home Access: Level entry     Home Layout: One level     Bathroom Shower/Tub: Chief Strategy Officer: Handicapped height Bathroom Accessibility: Yes   Home Equipment: Cane - single point   Additional Comments: just qualified for CNA      Prior Functioning/Environment Level of Independence: Independent                 OT Problem List: Decreased strength;Decreased activity tolerance;Impaired balance (sitting and/or standing);Decreased knowledge of use of DME or AE;Decreased cognition;Decreased safety awareness;Pain      OT Treatment/Interventions: Self-care/ADL training;DME and/or AE instruction;Therapeutic activities;Cognitive remediation/compensation;Patient/family education;Balance training    OT Goals(Current goals can be found in the care plan section) Acute Rehab OT Goals Patient Stated Goal: get back to independent OT Goal Formulation: With patient Time For Goal Achievement: 05/28/20 Potential to Achieve Goals: Good ADL Goals Pt Will Perform Grooming: with supervision;standing Pt Will Perform Lower Body Bathing: with supervision;sit to/from stand Pt Will Perform Lower Body Dressing: with supervision;sit to/from stand Pt Will Transfer to Toilet: with supervision;ambulating;bedside commode (over toilet) Pt Will Perform Toileting - Clothing Manipulation and hygiene: with supervision;sit to/from stand Additional ADL Goal #1: Pt will state at least 3 energy conservation strategies as instructed. Additional ADL Goal #2: Pt will participate in formal cognition assessment.  OT Frequency: Min 2X/week   Barriers to D/C:            Co-evaluation              AM-PAC OT "6 Clicks" Daily Activity     Outcome Measure Help from another person eating meals?: None Help from another person taking care of personal grooming?: A Little Help from another person  toileting, which includes using toliet, bedpan, or urinal?: A Little Help from another person bathing (including washing, rinsing, drying)?: A Lot Help from another person to put on and taking off regular upper body clothing?: A Little Help from another person to put on and taking off regular lower body clothing?: A Lot 6 Click Score: 17   End of Session Equipment Utilized During Treatment: Rolling walker;Oxygen Nurse Communication: Other (comment) (cognition)  Activity Tolerance: Patient tolerated treatment well Patient left: in chair;with call bell/phone within reach  OT Visit Diagnosis: Unsteadiness on feet (R26.81);Other abnormalities of gait and mobility (R26.89);Pain;Muscle weakness (generalized) (M62.81);Other symptoms and signs involving cognitive function                Time: 1335-1357 OT Time Calculation (min): 22 min Charges:  OT General Charges $OT Visit: 1 Visit OT Evaluation $OT Eval Moderate Complexity: 1 Mod  Martie Round, OTR/L Acute Rehabilitation Services Pager: [phone] Office: [phone]  Evern Bio 05/14/2020, 2:23 PM

## 2020-05-14 NOTE — Progress Notes (Signed)
This chaplain responded to the medical team's consult for Pt. spiritual care.  The Pt. is awake and alert; willing to witness to the strength in her Holiness faith and story.    The chaplain practices reflective listening during the Pt. story telling. The Pt. speaks with clarity and certainty in her decisions and life experiences.  The chaplain understands the Pt. goal is to take her time, build her  strength, and go back home. The chaplain learns from the Pt., trust is important.   The chaplain and the Pt. mutually decide to F/U with spiritual care and intercessory prayer.

## 2020-05-14 NOTE — Progress Notes (Signed)
ANTICOAGULATION CONSULT NOTE - Follow Up Consult  Pharmacy Consult for heparin Indication: atrial fibrillation and stroke  Labs: Recent Labs    05/12/20 1220 05/12/20 1228 05/12/20 1430 05/12/20 1550 05/12/20 1602 05/13/20 0527 05/14/20 0507  HGB 13.5 15.3*   < >  --  13.6 12.3 11.2*  HCT 42.8 45.0   < >  --  40.0 36.8 33.6*  PLT 242  --   --   --   --  235 203  APTT 26  --   --   --   --   --  92*  LABPROT 16.5*  --   --   --   --   --   --   INR 1.3*  --   --   --   --   --   --   CREATININE 1.09* 0.90  --   --   --  1.19* 1.28*  TROPONINIHS  --   --   --  99*  --   --   --    < > = values in this interval not displayed.    Assessment: 70yo female supratherapeutic on heparin with initial dosing while Eliquis on hold; no gtt issues or signs of bleeding per RN.  Goal of Therapy:  aPTT 66-85 seconds   Plan:  Will decrease heparin gtt by 1 units/kg/hr to 900 units/hr and check PTT in 8 hours.    Vernard Gambles, PharmD, BCPS  05/14/2020,6:21 AM

## 2020-05-14 NOTE — Progress Notes (Signed)
NAME:  [name] [name], MRN:  [id number], DOB:  [birth year], LOS: 2 ADMISSION DATE:  05/12/2020, CONSULTATION DATE:  5/2 REFERRING MD:  Corliss Skains, CHIEF COMPLAINT:  Left sided weakness, facial droop   History of Present Illness:  70 y/o female with multiple medical problems presented with left sided weakness and required TPA and mechanical thrombectomy.  Had a cardiac arrest immediately following her thrombectomy with CPR for < 5 minutes.    Pertinent  Medical History  Atrial fibrillation Medication non-compliance Systolic heart failure, LVEF 25-30% Hypertension Tricuspid regurgitation COVID 19 Hyperlipidemia  Significant Hospital Events: Including procedures, antibiotic start and stop dates in addition to other pertinent events   5/2 - Patient admitted to hospital Code Stroke with left sided weakness/facial droop. CTA shows vessel occlusion of the R ICA terminus, proximal M1 R MCA and proximal A1 R ACA with mild atherosclerotic disease within the carotid bifurcations and proximal ICAs.  Cleviprex and Labetolol started for Afib with RVR and HTN. Patient sent to IR for thrombectomy and was intubated for procedure. IR had succesful mechanical thrombectomy of the clot with complete vascularization.  After procedure, patient became hypotensive and went into a PEA arrest. Chest compressions were started and patient receive a total of 2 epi. Patient stabilized. Central line placed and patient placed on pressors (Vaso, Epi, Neo). Patient transported to 4N ICU. Came off vasopressors, was noted to be moving all four extremities after arrival to ICU 5/3 - Extubated, seen by HF   Interim History / Subjective:  Extubated yesterday. Mobilizing to chair today. Right arm still weak but improving. Complains of right sided chest pain.   Objective   Blood pressure 103/72, pulse (!) 109, temperature 97.6 F (36.4 C), temperature source Oral, resp. rate (!) 24, weight 92.9 kg, SpO2 91 %. CVP:  [10 mmHg-21  mmHg] 10 mmHg      Intake/Output Summary (Last 24 hours) at 05/14/2020 1622 Last data filed at 05/14/2020 1530 Gross per 24 hour  Intake 1754.09 ml  Output 1365 ml  Net 389.09 ml   Filed Weights   05/12/20 1200 05/13/20 0500 05/14/20 0500  Weight: 89.8 kg 90.1 kg 92.9 kg    Examination:  General:  Sitting in chair HENT: no icterus, Oral mucosa moist.  PULM:chest clear. Tenderness to palpation of right back.  CV: HS normal. Still tachycardic. GI: BS+, soft, nontender MSK: normal bulk and tone Neuro: awake, alert, following commands, moving all four extremities. RUE 4/5 strength.   Labs/imaging that I havepersonally reviewed  (right click and "Reselect all SmartList Selections" daily)  Hypokalemia.   Resolved Hospital Problem list     Assessment & Plan:  Was critically ill due to acute respiratory failure with hypoxemia> improved Acute pulmonary edema - Now doing well following extubation.   Cardiogenic shock, acute systolic heart failure, adequate UOP overnight - Continue diuresis  - Follow coox and adjust NE/milrinone accordinglyu.  Atrial fibrillation Non-compliance with home medicines Tele Amiodarone to continue Anticoagulation fine by PCCM, will defer to cardiology and neuro as to when to start back  Acute ischemic stroke to R common carotid artery, embolic, s/p TPA and mechanical thrombectomy with apparent good clinical result Assess PT/SLP  Secondary stroke prevention per neurology  Goal blood glucose 140-180 Goal blood pressure SBP < 180  AKI, due to cardiogenic shock Monitor BMET and UOP Replace electrolytes as needed  MSK chest pain from CPR - APAP - Lidocaine patch.    Best practice (right click and "Reselect all SmartList Selections"  daily)  Diet:  Oral advance diet per SLP  Pain/Anxiety/Delirium protocol (if indicated): No VAP protocol (if indicated): Not indicated DVT prophylaxis: Systemic AC GI prophylaxis: N/A Glucose control:  SSI  Yes Central venous access:  Yes, and it is still needed Arterial line:  Yes, and it is still needed Foley:  Yes, and it is still needed Mobility:  bed rest  PT consulted: Yes Last date of multidisciplinary goals of care discussion [5/2: [name] and daughter [name]] Code Status:  full code Disposition: remain in ICU  Labs   CBC: Recent Labs  Lab 05/12/20 1220 05/12/20 1228 05/12/20 1430 05/12/20 1602 05/13/20 0527 05/14/20 0507  WBC 6.2  --   --   --  13.1* 18.4*  NEUTROABS 3.4  --   --   --   --  15.5*  HGB 13.5 15.3* 14.3 13.6 12.3 11.2*  HCT 42.8 45.0 42.0 40.0 36.8 33.6*  MCV 79.6*  --   --   --  75.4* 75.8*  PLT 242  --   --   --  235 203    Basic Metabolic Panel: Recent Labs  Lab 05/12/20 1220 05/12/20 1228 05/12/20 1430 05/12/20 1550 05/12/20 1602 05/13/20 0527 05/14/20 0507  NA 135 137 136  --  136 134* 135  K 3.4* 3.4* 4.1  --  3.3* 3.5 3.1*  CL 100 101  --   --   --  102 100  CO2 22  --   --   --   --  22 26  GLUCOSE 257* 253*  --   --   --  166* 99  BUN 15 17  --   --   --  20 23  CREATININE 1.09* 0.90  --   --   --  1.19* 1.28*  CALCIUM 8.8*  --   --   --   --  8.5* 8.0*  MG  --   --   --  1.4*  --  2.4  --   PHOS  --   --   --  4.9*  --  4.5  --    GFR: Estimated Creatinine Clearance: 45.6 mL/min (A) (by C-G formula based on SCr of 1.28 mg/dL (H)). Recent Labs  Lab 05/12/20 1220 05/12/20 1550 05/12/20 2156 05/13/20 0527 05/13/20 1722 05/14/20 0507 05/14/20 0817  PROCALCITON  --   --   --   --   --   --  2.75  WBC 6.2  --   --  13.1*  --  18.4*  --   LATICACIDVEN  --  3.1* 2.4*  --  1.7  --   --     Liver Function Tests: Recent Labs  Lab 05/12/20 1220 05/13/20 0527 05/14/20 0507  AST 47* 141* 109*  ALT 26 77* 87*  ALKPHOS 106 109 133*  BILITOT 1.0 0.8 0.9  PROT 6.4* 5.3* 5.1*  ALBUMIN 2.6* 2.1* 2.1*   No results for input(s): LIPASE, AMYLASE in the last 168 hours. No results for input(s): AMMONIA in the last 168  hours.  ABG    Component Value Date/Time   PHART 7.401 05/12/2020 1602   PCO2ART 37.6 05/12/2020 1602   PO2ART 349 (H) 05/12/2020 1602   HCO3 23.5 05/12/2020 1602   TCO2 25 05/12/2020 1602   ACIDBASEDEF 1.0 05/12/2020 1602   O2SAT 56.4 05/14/2020 0507     Coagulation Profile: Recent Labs  Lab 05/12/20 1220  INR 1.3*    Cardiac Enzymes: No results for  input(s): CKTOTAL, CKMB, CKMBINDEX, TROPONINI in the last 168 hours.  HbA1C: HbA1c, POC (controlled diabetic range)  Date/Time Value Ref Range Status  12/30/2017 10:02 AM 9.4 (A) 0.0 - 7.0 % Final  09/06/2017 01:31 PM 8.8 (A) 0.0 - 7.0 % Final   HbA1c POC (<> result, manual entry)  Date/Time Value Ref Range Status  07/26/2019 11:08 AM >15.0 4.0 - 5.6 % Final   Hgb A1c MFr Bld  Date/Time Value Ref Range Status  05/13/2020 05:27 AM 10.5 (H) 4.8 - 5.6 % Final    Comment:    (NOTE) Pre diabetes:          5.7%-6.4%  Diabetes:              >6.4%  Glycemic control for   <7.0% adults with diabetes   02/04/2020 06:57 AM 10.4 (H) 4.8 - 5.6 % Final    Comment:    (NOTE) Pre diabetes:          5.7%-6.4%  Diabetes:              >6.4%  Glycemic control for   <7.0% adults with diabetes     CBG: Recent Labs  Lab 05/13/20 2332 05/14/20 0334 05/14/20 0754 05/14/20 1132 05/14/20 1549  GLUCAP 213* 119* 79 122* 121*    CRITICAL CARE Performed by: Lynnell Catalan   Total critical care time: 35 minutes  Critical care time was exclusive of separately billable procedures and treating other patients.  Critical care was necessary to treat or prevent imminent or life-threatening deterioration.  Critical care was time spent personally by me on the following activities: development of treatment plan with patient and/or surrogate as well as nursing, discussions with consultants, evaluation of patient's response to treatment, examination of patient, obtaining history from patient or surrogate, ordering and performing  treatments and interventions, ordering and review of laboratory studies, ordering and review of radiographic studies, pulse oximetry, re-evaluation of patient's condition and participation in multidisciplinary rounds.  Lynnell Catalan, MD [facility] ICU Physician [facility]  Pager: [phone] Mobile: [phone] After hours: [phone].

## 2020-05-14 NOTE — Progress Notes (Signed)
eLink Physician-Brief Progress Note Patient Name: [name] [name] DOB: 07/14/1950 MRN: [id number]   Date of Service  05/14/2020  HPI/Events of Note  Patient requesting adjustment to his Oxycodone  Dosing schedule for back pain.  eICU Interventions  Oxycodone changed to 5 mg PO Q 4 hours PRN back pain.        Thomasene Lot Lexey Fletes 05/14/2020, 11:32 PM

## 2020-05-14 NOTE — Progress Notes (Addendum)
STROKE TEAM PROGRESS NOTE   SUBJECTIVE (INTERVAL HISTORY) Her PT is at the bedside. Pt was sitting in chair, doing well, HR 110s, BP 114/74. On amiodarone and heparin IV. Neuro intact.      OBJECTIVE Temp:  [97.2 F (36.2 C)-98.8 F (37.1 C)] 97.2 F (36.2 C) (05/04 1131) Pulse Rate:  [59-206] 111 (05/04 1300) Cardiac Rhythm: Junctional rhythm (05/04 1230) Resp:  [20-39] 32 (05/04 1300) BP: (91-162)/(45-134) 122/84 (05/04 1200) SpO2:  [87 %-99 %] 92 % (05/04 1300) Arterial Line BP: (83-164)/(52-88) 112/57 (05/04 1300) Weight:  [92.9 kg] 92.9 kg (05/04 0500)  Recent Labs  Lab 05/13/20 2007 05/13/20 2332 05/14/20 0334 05/14/20 0754 05/14/20 1132  GLUCAP 137* 213* 119* 79 122*   Recent Labs  Lab 05/12/20 1220 05/12/20 1228 05/12/20 1430 05/12/20 1550 05/12/20 1602 05/13/20 0527 05/14/20 0507  NA 135 137 136  --  136 134* 135  K 3.4* 3.4* 4.1  --  3.3* 3.5 3.1*  CL 100 101  --   --   --  102 100  CO2 22  --   --   --   --  22 26  GLUCOSE 257* 253*  --   --   --  166* 99  BUN 15 17  --   --   --  20 23  CREATININE 1.09* 0.90  --   --   --  1.19* 1.28*  CALCIUM 8.8*  --   --   --   --  8.5* 8.0*  MG  --   --   --  1.4*  --  2.4  --   PHOS  --   --   --  4.9*  --  4.5  --    Recent Labs  Lab 05/12/20 1220 05/13/20 0527 05/14/20 0507  AST 47* 141* 109*  ALT 26 77* 87*  ALKPHOS 106 109 133*  BILITOT 1.0 0.8 0.9  PROT 6.4* 5.3* 5.1*  ALBUMIN 2.6* 2.1* 2.1*   Recent Labs  Lab 05/12/20 1220 05/12/20 1228 05/12/20 1430 05/12/20 1602 05/13/20 0527 05/14/20 0507  WBC 6.2  --   --   --  13.1* 18.4*  NEUTROABS 3.4  --   --   --   --  15.5*  HGB 13.5 15.3* 14.3 13.6 12.3 11.2*  HCT 42.8 45.0 42.0 40.0 36.8 33.6*  MCV 79.6*  --   --   --  75.4* 75.8*  PLT 242  --   --   --  235 203   No results for input(s): CKTOTAL, CKMB, CKMBINDEX, TROPONINI in the last 168 hours. Recent Labs    05/12/20 1220  LABPROT 16.5*  INR 1.3*   Recent Labs    05/14/20 0912   COLORURINE YELLOW  LABSPEC 1.012  PHURINE 5.0  GLUCOSEU NEGATIVE  HGBUR SMALL*  BILIRUBINUR NEGATIVE  KETONESUR NEGATIVE  PROTEINUR NEGATIVE  NITRITE NEGATIVE  LEUKOCYTESUR NEGATIVE       Component Value Date/Time   CHOL 87 05/13/2020 0527   CHOL 132 06/08/2017 1110   TRIG 46 05/13/2020 0527   HDL 17 (L) 05/13/2020 0527   HDL 35 (L) 06/08/2017 1110   CHOLHDL 5.1 05/13/2020 0527   VLDL 9 05/13/2020 0527   LDLCALC 61 05/13/2020 0527   LDLCALC 67 06/08/2017 1110   Lab Results  Component Value Date   HGBA1C 10.5 (H) 05/13/2020   No results found for: LABOPIA, COCAINSCRNUR, LABBENZ, AMPHETMU, THCU, LABBARB  No results for input(s): ETH in the last  168 hours.  I have personally reviewed the radiological images below and agree with the radiology interpretations.  DG Abd 1 View  Result Date: 05/12/2020 CLINICAL DATA:  Enteric catheter placement EXAM: ABDOMEN - 1 VIEW COMPARISON:  None. FINDINGS: Frontal view of the lower chest and upper abdomen demonstrates enteric catheter passing below diaphragm tip overlying proximal duodenum. External defibrillator pads overlie the lower chest. Bowel gas pattern is unremarkable. IMPRESSION: 1. Enteric catheter tip projecting over proximal duodenum. Electronically Signed   By: Randa Ngo M.D.   On: 05/12/2020 16:28   CT ANGIO NECK W OR WO CONTRAST  Result Date: 05/12/2020 CLINICAL DATA:  Left-sided weakness EXAM: CT ANGIOGRAPHY HEAD AND NECK TECHNIQUE: Multidetector CT imaging of the head and neck was performed using the standard protocol during bolus administration of intravenous contrast. Multiplanar CT image reconstructions and MIPs were obtained to evaluate the vascular anatomy. Carotid stenosis measurements (when applicable) are obtained utilizing NASCET criteria, using the distal internal carotid diameter as the denominator. CONTRAST:  73m OMNIPAQUE IOHEXOL 350 MG/ML SOLN COMPARISON:  Noncontrast head CT performed earlier today 05/12/2020.  FINDINGS: CTA NECK FINDINGS Aortic arch: Standard aortic branching. No hemodynamically significant innominate or proximal subclavian artery stenosis. Right carotid system: CCA and ICA patent within the neck without significant stenosis (50% or greater). Mild soft and calcified plaque within the carotid bifurcation and proximal ICA. Asymmetrically diminished enhancement of the cervical right ICA, likely due to downstream occlusion. Left carotid system: CCA and ICA patent within the neck without significant stenosis (50% or greater). Mild soft and calcified plaque within the carotid bifurcation and proximal ICA. Vertebral arteries: Patent within the neck without stenosis. Duplicated proximal left vertebral artery. Skeleton: No acute bony abnormality or aggressive osseous lesion. Cervical spondylosis. Other neck: Asymmetric prominence of the right thyroid lobe. Underlying right thyroid lobe nodule measuring at least 2.8 cm. Upper chest: Partially imaged left pleural effusion. Review of the MIP images confirms the above findings CTA HEAD FINDINGS Anterior circulation: Asymmetrically diminished enhancement and caliber of the right ICA siphon, likely secondary to downstream occlusion. Absent enhancement within the right ICA terminus, proximal M1 right MCA and proximal A1 right ACA. Findings are compatible with T-shaped occlusion secondary to thrombus. There is some reconstitution of enhancement more distally within the M1 right MCA and within right M2 vessels. There is reconstitution of enhancement within the distal A1 right ACA. The intracranial left ICA is patent. No left M2 proximal branch occlusion or high-grade proximal stenosis is identified. Developmentally hypoplastic A1 left ACA. The A2 and more distal anterior cerebral arteries are patent. No intracranial aneurysm is identified. Posterior circulation: The intracranial vertebral arteries are patent. The basilar artery is patent. The posterior cerebral arteries  are patent. A right posterior communicating artery is present. The left posterior communicating artery is hypoplastic or absent. Venous sinuses: Within the limitations of contrast timing, no convincing thrombus. Anatomic variants: As described Review of the MIP images confirms the above findings Critical/emergent results were called by telephone at the time of interpretation on 05/12/2020 at 12:53 pm [facility] verbally acknowledged these results. IMPRESSION: CTA neck: 1. The common carotid, internal carotid and vertebral arteries are patent within the neck without hemodynamically significant stenosis. Mild atherosclerotic disease within the carotid bifurcations and proximal ICAs. 2. Asymmetrically diminished enhancement of the cervical right ICA, likely secondary to downstream occlusion. 3. 2.8 cm right thyroid lobe nodule. Non-emergent thyroid ultrasound is recommended for further evaluation. CTA head: T-shaped large vessel occlusion  involving the right ICA terminus, proximal M1 right middle cerebral artery and proximal A1 right anterior cerebral artery. Electronically Signed   By: Kellie Simmering DO   On: 05/12/2020 13:13   MR ANGIO HEAD WO CONTRAST  Result Date: 05/13/2020 CLINICAL DATA:  Follow-up stroke with intervention EXAM: MRI HEAD WITHOUT CONTRAST MRA HEAD WITHOUT CONTRAST TECHNIQUE: Multiplanar, multiecho pulse sequences of the brain and surrounding structures were obtained without intravenous contrast. Angiographic images of the head were obtained using MRA technique without contrast. COMPARISON:  CT and interventional studies of yesterday. FINDINGS: MRI HEAD FINDINGS Brain: Diffusion imaging shows a few scattered subcentimeter and punctate infarctions in the right middle cerebral artery territory affecting the posterior frontal, parietal and parietooccipital brain. No large confluent infarction. No other vascular territory insult. Elsewhere, there are mild chronic small-vessel  ischemic changes of the cerebral hemispheric white matter. No sign of swelling or hemorrhage. No hydrocephalus or extra-axial collection. Vascular: Major vessels at the base of the brain show flow. Skull and upper cervical spine: Negative Sinuses/Orbits: Clear/normal Other: None MRA HEAD FINDINGS Both internal carotid arteries are widely patent through the skull base and siphon regions. Left ICA supplies only the middle cerebral artery territory. Right ICA supplies the middle cerebral artery territory and both posterior cerebral artery territories. Major vessels all show flow. Both vertebral arteries widely patent to the basilar. No basilar stenosis. Posterior circulation branch vessels appear normal. IMPRESSION: Successful vascular recanalization. No large or medium vessel occlusion presently. Several scattered subcentimeter and punctate acute infarctions in the periphery of the right middle cerebral artery territory consistent with minimal embolic insults. No swelling or hemorrhage. Electronically Signed   By: Nelson Chimes M.D.   On: 05/13/2020 14:02   MR BRAIN WO CONTRAST  Result Date: 05/13/2020 CLINICAL DATA:  Follow-up stroke with intervention EXAM: MRI HEAD WITHOUT CONTRAST MRA HEAD WITHOUT CONTRAST TECHNIQUE: Multiplanar, multiecho pulse sequences of the brain and surrounding structures were obtained without intravenous contrast. Angiographic images of the head were obtained using MRA technique without contrast. COMPARISON:  CT and interventional studies of yesterday. FINDINGS: MRI HEAD FINDINGS Brain: Diffusion imaging shows a few scattered subcentimeter and punctate infarctions in the right middle cerebral artery territory affecting the posterior frontal, parietal and parietooccipital brain. No large confluent infarction. No other vascular territory insult. Elsewhere, there are mild chronic small-vessel ischemic changes of the cerebral hemispheric white matter. No sign of swelling or hemorrhage. No  hydrocephalus or extra-axial collection. Vascular: Major vessels at the base of the brain show flow. Skull and upper cervical spine: Negative Sinuses/Orbits: Clear/normal Other: None MRA HEAD FINDINGS Both internal carotid arteries are widely patent through the skull base and siphon regions. Left ICA supplies only the middle cerebral artery territory. Right ICA supplies the middle cerebral artery territory and both posterior cerebral artery territories. Major vessels all show flow. Both vertebral arteries widely patent to the basilar. No basilar stenosis. Posterior circulation branch vessels appear normal. IMPRESSION: Successful vascular recanalization. No large or medium vessel occlusion presently. Several scattered subcentimeter and punctate acute infarctions in the periphery of the right middle cerebral artery territory consistent with minimal embolic insults. No swelling or hemorrhage. Electronically Signed   By: Nelson Chimes M.D.   On: 05/13/2020 14:02   IR CT Head Ltd  Result Date: 05/13/2020 INDICATION: 70 year old female with past medical history significant for atrial fibrillation and CHF. She was last known well at 10 a.m. on 05/12/2020. At 11:30 a.m., she was noted to have facial droop and slurred  speech. She was brought by EMS to ED. At presentation, she head left-sided hemiparesis with NIHSS 6. Head CT showed no evidence of a large territorial infarct or hemorrhage. CT angiogram showed an occlusion of the right ICA terminus extending into the proximal M1 and A1. IV tPA bolus was administered at 13:15. She was then taken to our service for a mechanical thrombectomy. EXAM: ULTRASOUND-GUIDED VASCULAR ACCESS DIAGNOSTIC CEREBRAL ANGIOGRAM MECHANICAL THROMBECTOMY FLAT PANEL HEAD CT COMPARISON:  CT/CT angiogram of the head and neck May 12, 2020 MEDICATIONS: Refer to anesthesia documentation ANESTHESIA/SEDATION: The procedure was performed in the general anesthesia. CONTRAST:  50 mL of Omnipaque 300.  FLUOROSCOPY TIME:  Fluoroscopy Time: 8 minutes (233 mGy). COMPLICATIONS: None immediate. TECHNIQUE: Informed written consent was obtained from the patient's daughter after a thorough discussion of the procedural risks, benefits and alternatives. All questions were addressed. Maximal Sterile Barrier Technique was utilized including caps, mask, sterile gowns, sterile gloves, sterile drape, hand hygiene and skin antiseptic. A timeout was performed prior to the initiation of the procedure. The right groin was prepped and draped in the usual sterile fashion. Using a micropuncture kit and the modified Seldinger technique, access was gained to the right common femoral artery and an 8 French sheath was placed. Real-time ultrasound guidance was utilized for vascular access including the acquisition of a permanent ultrasound image documenting patency of the accessed vessel. Under fluoroscopy, a Zoom 88 guide catheter was navigated over a 6 [place] Berenstein 2 catheter and a 0.035" Terumo Glidewire into the aortic arch. The catheter was placed into the right common carotid artery. Frontal and lateral angiograms of the head and neck were obtained. FINDINGS: Patent right common femoral artery. There is no occlusion of the intracranial right internal carotid just distal to the origin of the right posterior communicating artery. Minimal distal contrast penetration is seen. PROCEDURE: Under biplane roadmap, the catheter construct was advanced over the Glidewire into the cervical right internal carotid artery. The Berenstein catheter was removed. Under fluoroscopy, a zoom 71 aspiration catheter was navigated through the zoom 88 guide catheter and over an Aristotle 18 micro guidewire into the supraclinoid right ICA. The zoom 71 catheter was connected to a penumbra aspiration pump in continuous aspiration was performed for approximately 1 minute. Continuous aspiration was also performed concomitantly in the zoom 88 catheter with a 60  cc vacuum pressure syringe. The aspiration catheter was removed under constant aspiration. The guiding catheter was aspirated until clear blood was obtained. Right internal carotid artery angiogram showed complete recanalization of the right ICA (TICI3). Delay right ICA angiograms showed persistent patency of the right ICA, right MCA ACA vascular trees. Flat panel CT of the head was obtained and post processed in a separate workstation with concurrent attending physician supervision. Selected images were sent to PACS. No evidence of hemorrhagic complication. A right common femoral artery angiogram was obtained with frontal view. The access is at the level of the right common femoral artery which has adequate caliber for closure device. The femoral sheath was exchanged over the wire for a Perclose prostyle which was utilized for access closure. Immediate hemostasis was achieved. Patient became hypotensive while awaiting COVID test result for extubation, after intervention was concluded and eventually progressed to PEA arrest. ROSC obtained after 2 rounds of chest compressions. Patient stabilized and transferred to ICU. Please see anesthesia documentation for further details. Family updated by telephone. IMPRESSION: 1. Successful mechanical thrombectomy for treatment of a right ICA terminus occlusion with direct contact aspiration. Total  of 1 pass with complete recanalization (TICI 3). 2. No thromboembolic or hemorrhagic complication. 3. Postprocedural PEA cardiac arrest with ROSC obtained after 2 rounds of chest compression. Please see anesthesia documentation for further details. PLAN: 1. Transferd to ICU for further care. 2. Follow-up head CT/MRI within 24 hours. Electronically Signed   By: Pedro Earls M.D.   On: 05/13/2020 10:33   IR US Guide Vasc Access Right  Result Date: 05/13/2020 INDICATION: 70 year old female with past medical history significant for atrial fibrillation and CHF. She was  last known well at 10 a.m. on 05/12/2020. At 11:30 a.m., she was noted to have facial droop and slurred speech. She was brought by EMS to ED. At presentation, she head left-sided hemiparesis with NIHSS 6. Head CT showed no evidence of a large territorial infarct or hemorrhage. CT angiogram showed an occlusion of the right ICA terminus extending into the proximal M1 and A1. IV tPA bolus was administered at 13:15. She was then taken to our service for a mechanical thrombectomy. EXAM: ULTRASOUND-GUIDED VASCULAR ACCESS DIAGNOSTIC CEREBRAL ANGIOGRAM MECHANICAL THROMBECTOMY FLAT PANEL HEAD CT COMPARISON:  CT/CT angiogram of the head and neck May 12, 2020 MEDICATIONS: Refer to anesthesia documentation ANESTHESIA/SEDATION: The procedure was performed in the general anesthesia. CONTRAST:  50 mL of Omnipaque 300. FLUOROSCOPY TIME:  Fluoroscopy Time: 8 minutes (233 mGy). COMPLICATIONS: None immediate. TECHNIQUE: Informed written consent was obtained from the patient's daughter after a thorough discussion of the procedural risks, benefits and alternatives. All questions were addressed. Maximal Sterile Barrier Technique was utilized including caps, mask, sterile gowns, sterile gloves, sterile drape, hand hygiene and skin antiseptic. A timeout was performed prior to the initiation of the procedure. The right groin was prepped and draped in the usual sterile fashion. Using a micropuncture kit and the modified Seldinger technique, access was gained to the right common femoral artery and an 8 French sheath was placed. Real-time ultrasound guidance was utilized for vascular access including the acquisition of a permanent ultrasound image documenting patency of the accessed vessel. Under fluoroscopy, a Zoom 88 guide catheter was navigated over a 6 [place] Berenstein 2 catheter and a 0.035" Terumo Glidewire into the aortic arch. The catheter was placed into the right common carotid artery. Frontal and lateral angiograms of the head and  neck were obtained. FINDINGS: Patent right common femoral artery. There is no occlusion of the intracranial right internal carotid just distal to the origin of the right posterior communicating artery. Minimal distal contrast penetration is seen. PROCEDURE: Under biplane roadmap, the catheter construct was advanced over the Glidewire into the cervical right internal carotid artery. The Berenstein catheter was removed. Under fluoroscopy, a zoom 71 aspiration catheter was navigated through the zoom 88 guide catheter and over an Aristotle 18 micro guidewire into the supraclinoid right ICA. The zoom 71 catheter was connected to a penumbra aspiration pump in continuous aspiration was performed for approximately 1 minute. Continuous aspiration was also performed concomitantly in the zoom 88 catheter with a 60 cc vacuum pressure syringe. The aspiration catheter was removed under constant aspiration. The guiding catheter was aspirated until clear blood was obtained. Right internal carotid artery angiogram showed complete recanalization of the right ICA (TICI3). Delay right ICA angiograms showed persistent patency of the right ICA, right MCA ACA vascular trees. Flat panel CT of the head was obtained and post processed in a separate workstation with concurrent attending physician supervision. Selected images were sent to PACS. No evidence of hemorrhagic complication. A right  common femoral artery angiogram was obtained with frontal view. The access is at the level of the right common femoral artery which has adequate caliber for closure device. The femoral sheath was exchanged over the wire for a Perclose prostyle which was utilized for access closure. Immediate hemostasis was achieved. Patient became hypotensive while awaiting COVID test result for extubation, after intervention was concluded and eventually progressed to PEA arrest. ROSC obtained after 2 rounds of chest compressions. Patient stabilized and transferred to  ICU. Please see anesthesia documentation for further details. Family updated by telephone. IMPRESSION: 1. Successful mechanical thrombectomy for treatment of a right ICA terminus occlusion with direct contact aspiration. Total of 1 pass with complete recanalization (TICI 3). 2. No thromboembolic or hemorrhagic complication. 3. Postprocedural PEA cardiac arrest with ROSC obtained after 2 rounds of chest compression. Please see anesthesia documentation for further details. PLAN: 1. Transferd to ICU for further care. 2. Follow-up head CT/MRI within 24 hours. Electronically Signed   By: Pedro Earls M.D.   On: 05/13/2020 10:33   DG CHEST PORT 1 VIEW  Result Date: 05/13/2020 CLINICAL DATA:  Respiratory failure.  Hypoxia. EXAM: PORTABLE CHEST 1 VIEW COMPARISON:  05/12/2020. FINDINGS: NG tube noted with tip below left hemidiaphragm. Endotracheal tube and right IJ line in stable position. Severe cardiomegaly again noted. Diffuse bilateral pulmonary infiltrates/edema noted on today's exam. Left base atelectasis. Probable moderate left pleural effusion. No pneumothorax. IMPRESSION: 1. Interim placement of NG tube. Endotracheal tube right IJ line stable position. 2.  Severe cardiomegaly again noted. 3. Diffuse bilateral pulmonary infiltrates/edema noted on today's exam. Left base atelectasis. Probable moderate left pleural effusion. Electronically Signed   By: Marcello Moores  Register   On: 05/13/2020 05:56   Portable Chest x-ray  Result Date: 05/12/2020 CLINICAL DATA:  Stroke and respiratory failure. EXAM: PORTABLE CHEST 1 VIEW COMPARISON:  02/04/2020 FINDINGS: Stable cardiac enlargement. Endotracheal tube present with the tip approximately 3 cm above the carina. Right jugular central line present with the tip in the lower SVC. No pneumothorax. Left lower lobe atelectasis/consolidation present. No significant pulmonary edema or pleural fluid. IMPRESSION: Left lower lobe atelectasis/consolidation.  No  pneumothorax. Electronically Signed   By: Aletta Edouard M.D.   On: 05/12/2020 16:25   ECHOCARDIOGRAM COMPLETE  Result Date: 05/12/2020    ECHOCARDIOGRAM REPORT   Patient Name:   [name] [name] Date of Exam: 05/12/2020 Medical Rec #:  [id number]         Height:       63.0 in Accession #:    [id number]        Weight:       198.0 lb Date of Birth:  March 24, 1950        BSA:          1.925 m Patient Age:    70 years          BP:           223/89 mmHg Patient Gender: F                 HR:           113 bpm. Exam Location:  Inpatient Procedure: 2D Echo, Cardiac Doppler and Color Doppler STAT ECHO REPORT CONTAINS CRITICAL RESULT Indications:    Stroke.; I50.40* Unspecified combined systolic (congestive) and                 diastolic (congestive) heart failure  History:        Patient has  prior history of Echocardiogram examinations. CHF,                 Abnormal ECG, Stroke, Arrythmias:Atrial Fibrillation,                 Signs/Symptoms:Hypotension, Shortness of Breath and Dyspnea;                 Risk Factors:Hypertension and Dyslipidemia. Pericardial                 effusion.  Sonographer:    Roseanna Rainbow RDCS Referring Phys: 17 DAYNA N DUNN  Sonographer Comments: Technically difficult study due to poor echo windows, patient is morbidly obese and echo performed with patient supine and on artificial respirator. Image acquisition challenging due to patient body habitus. IMPRESSIONS  1. Left ventricular ejection fraction, by estimation, is <20%. The left ventricle has severely decreased function. The left ventricle demonstrates global hypokinesis. There is severe concentric left ventricular hypertrophy. Diastolic function is indeterminant due to atrial fibrillation. No LV thrombus visualized, however, apex incompletely visualized due to lack of definity contrast.  2. Right ventricular systolic function is severely reduced. The right ventricular size is mildly enlarged. There is mildly elevated pulmonary artery systolic  pressure. The estimated right ventricular systolic pressure is 64.6 mmHg.  3. A large, circumferential pericardial effusion is present. The effusion appears to be greatest around the posterolateral LV measuring up to 2.2cm at end-diastole. There is no RV or RA collapse to suggest tamponade. IVC is dilated and not collapsible but patient is on the vent with significant TR, which are likely the primary drivers of IVC dilation.  4. The mitral valve is normal in structure. Mild mitral valve regurgitation.  5. Tricuspid valve regurgitation is moderate to severe.  6. The aortic valve is tricuspid. There is mild calcification of the aortic valve. There is mild thickening of the aortic valve. Aortic valve regurgitation is trivial. Mild aortic valve sclerosis is present, with no evidence of aortic valve stenosis.  7. Left atrial size was mildly dilated. Comparison(s): Compared to prior TTE in 01/2020, the LVEF is now severely reduced (<20%) and a large pericardial effusion is present (previously small). The RV systolic function now appears severely reduced. FINDINGS  Left Ventricle: Left ventricular ejection fraction, by estimation, is <20%. The left ventricle has severely decreased function. The left ventricle demonstrates global hypokinesis. The left ventricular internal cavity size was normal in size. There is severe concentric left ventricular hypertrophy. Diastolic function is indeterminant due to atrial fibrillation. No LV thrombus visualized, however, apex incompletely visualized due to lack of definity contrast. Right Ventricle: The right ventricular size is mildly enlarged. No increase in right ventricular wall thickness. Right ventricular systolic function is severely reduced. There is mildly elevated pulmonary artery systolic pressure. The tricuspid regurgitant velocity is 2.53 m/s, and with an assumed right atrial pressure of 15 mmHg, the estimated right ventricular systolic pressure is 80.3 mmHg. Left Atrium:  Left atrial size was mildly dilated. Right Atrium: Right atrial size was normal in size. Pericardium: A large, circumferential pericardial effusion is present. The effusion appears to be greatest around the posterolateral LV measuring up to 2.2cm at end-diastole. There is no RV or RA collapse to suggest tamponade. IVC is dilated and not collapsible but patient is on the vent with significant TR, which are likely the primary drivers of IVC dilation. Mitral Valve: The mitral valve is normal in structure. There is mild thickening of the mitral valve leaflet(s). There is mild  calcification of the mitral valve leaflet(s). Mild mitral annular calcification. Mild mitral valve regurgitation. Tricuspid Valve: The tricuspid valve is normal in structure. Tricuspid valve regurgitation is moderate to severe. The flow in the hepatic veins is reversed during ventricular systole. Aortic Valve: The aortic valve is tricuspid. There is mild calcification of the aortic valve. There is mild thickening of the aortic valve. Aortic valve regurgitation is trivial. Mild aortic valve sclerosis is present, with no evidence of aortic valve stenosis. Pulmonic Valve: The pulmonic valve was not well visualized. Pulmonic valve regurgitation is trivial. Aorta: The aortic root and ascending aorta are structurally normal, with no evidence of dilitation. IAS/Shunts: No atrial level shunt detected by color flow Doppler. Additional Comments: There is a small pleural effusion in the left lateral region.  LEFT VENTRICLE PLAX 2D LVIDd:         4.80 cm      Diastology LVIDs:         4.60 cm      LV e' medial:    4.13 cm/s LV PW:         1.40 cm      LV E/e' medial:  14.9 LV IVS:        1.60 cm      LV e' lateral:   6.90 cm/s LVOT diam:     2.10 cm      LV E/e' lateral: 8.9 LV SV:         21 LV SV Index:   11 LVOT Area:     3.46 cm  LV Volumes (MOD) LV vol d, MOD A2C: 104.0 ml LV vol d, MOD A4C: 63.1 ml LV vol s, MOD A2C: 97.8 ml LV vol s, MOD A4C: 54.0 ml  LV SV MOD A2C:     6.2 ml LV SV MOD A4C:     63.1 ml LV SV MOD BP:      5.5 ml RIGHT VENTRICLE            IVC RV S prime:     5.70 cm/s  IVC diam: 2.20 cm TAPSE (M-mode): 1.0 cm LEFT ATRIUM             Index       RIGHT ATRIUM           Index LA diam:        3.40 cm 1.77 cm/m  RA Area:     20.70 cm LA Vol (A2C):   53.4 ml 27.74 ml/m RA Volume:   65.50 ml  34.02 ml/m LA Vol (A4C):   60.4 ml 31.37 ml/m LA Biplane Vol: 59.7 ml 31.01 ml/m  AORTIC VALVE             PULMONIC VALVE LVOT Vmax:   58.70 cm/s  PR End Diast Vel: 2.11 msec LVOT Vmean:  34.400 cm/s LVOT VTI:    0.061 m  AORTA Ao Root diam: 3.30 cm MITRAL VALVE               TRICUSPID VALVE MV Area (PHT): 6.17 cm    TR Peak grad:   25.6 mmHg MV Decel Time: 123 msec    TR Vmax:        253.00 cm/s MV E velocity: 61.38 cm/s                            SHUNTS  Systemic VTI:  0.06 m                            Systemic Diam: 2.10 cm Gwyndolyn Kaufman MD Electronically signed by Gwyndolyn Kaufman MD Signature Date/Time: 05/12/2020/7:24:37 PM    Final    IR PERCUTANEOUS ART THROMBECTOMY/INFUSION INTRACRANIAL INC DIAG ANGIO  Result Date: 05/13/2020 INDICATION: 70 year old female with past medical history significant for atrial fibrillation and CHF. She was last known well at 10 a.m. on 05/12/2020. At 11:30 a.m., she was noted to have facial droop and slurred speech. She was brought by EMS to ED. At presentation, she head left-sided hemiparesis with NIHSS 6. Head CT showed no evidence of a large territorial infarct or hemorrhage. CT angiogram showed an occlusion of the right ICA terminus extending into the proximal M1 and A1. IV tPA bolus was administered at 13:15. She was then taken to our service for a mechanical thrombectomy. EXAM: ULTRASOUND-GUIDED VASCULAR ACCESS DIAGNOSTIC CEREBRAL ANGIOGRAM MECHANICAL THROMBECTOMY FLAT PANEL HEAD CT COMPARISON:  CT/CT angiogram of the head and neck May 12, 2020 MEDICATIONS: Refer to anesthesia  documentation ANESTHESIA/SEDATION: The procedure was performed in the general anesthesia. CONTRAST:  50 mL of Omnipaque 300. FLUOROSCOPY TIME:  Fluoroscopy Time: 8 minutes (233 mGy). COMPLICATIONS: None immediate. TECHNIQUE: Informed written consent was obtained from the patient's daughter after a thorough discussion of the procedural risks, benefits and alternatives. All questions were addressed. Maximal Sterile Barrier Technique was utilized including caps, mask, sterile gowns, sterile gloves, sterile drape, hand hygiene and skin antiseptic. A timeout was performed prior to the initiation of the procedure. The right groin was prepped and draped in the usual sterile fashion. Using a micropuncture kit and the modified Seldinger technique, access was gained to the right common femoral artery and an 8 French sheath was placed. Real-time ultrasound guidance was utilized for vascular access including the acquisition of a permanent ultrasound image documenting patency of the accessed vessel. Under fluoroscopy, a Zoom 88 guide catheter was navigated over a 6 [place] Berenstein 2 catheter and a 0.035" Terumo Glidewire into the aortic arch. The catheter was placed into the right common carotid artery. Frontal and lateral angiograms of the head and neck were obtained. FINDINGS: Patent right common femoral artery. There is no occlusion of the intracranial right internal carotid just distal to the origin of the right posterior communicating artery. Minimal distal contrast penetration is seen. PROCEDURE: Under biplane roadmap, the catheter construct was advanced over the Glidewire into the cervical right internal carotid artery. The Berenstein catheter was removed. Under fluoroscopy, a zoom 71 aspiration catheter was navigated through the zoom 88 guide catheter and over an Aristotle 18 micro guidewire into the supraclinoid right ICA. The zoom 71 catheter was connected to a penumbra aspiration pump in continuous aspiration was  performed for approximately 1 minute. Continuous aspiration was also performed concomitantly in the zoom 88 catheter with a 60 cc vacuum pressure syringe. The aspiration catheter was removed under constant aspiration. The guiding catheter was aspirated until clear blood was obtained. Right internal carotid artery angiogram showed complete recanalization of the right ICA (TICI3). Delay right ICA angiograms showed persistent patency of the right ICA, right MCA ACA vascular trees. Flat panel CT of the head was obtained and post processed in a separate workstation with concurrent attending physician supervision. Selected images were sent to PACS. No evidence of hemorrhagic complication. A right common femoral artery angiogram was obtained with frontal view. The  access is at the level of the right common femoral artery which has adequate caliber for closure device. The femoral sheath was exchanged over the wire for a Perclose prostyle which was utilized for access closure. Immediate hemostasis was achieved. Patient became hypotensive while awaiting COVID test result for extubation, after intervention was concluded and eventually progressed to PEA arrest. ROSC obtained after 2 rounds of chest compressions. Patient stabilized and transferred to ICU. Please see anesthesia documentation for further details. Family updated by telephone. IMPRESSION: 1. Successful mechanical thrombectomy for treatment of a right ICA terminus occlusion with direct contact aspiration. Total of 1 pass with complete recanalization (TICI 3). 2. No thromboembolic or hemorrhagic complication. 3. Postprocedural PEA cardiac arrest with ROSC obtained after 2 rounds of chest compression. Please see anesthesia documentation for further details. PLAN: 1. Transferd to ICU for further care. 2. Follow-up head CT/MRI within 24 hours. Electronically Signed   By: Pedro Earls M.D.   On: 05/13/2020 10:33   CT HEAD CODE STROKE WO  CONTRAST  Result Date: 05/12/2020 CLINICAL DATA:  Code stroke. Neuro deficit, acute, stroke suspected. Additional history provided: Left-sided weakness. EXAM: CT HEAD WITHOUT CONTRAST TECHNIQUE: Contiguous axial images were obtained from the base of the skull through the vertex without intravenous contrast. COMPARISON:  Prior head CT examinations 05/10/2010. FINDINGS: Brain: Cerebral volume is normal. There is no acute intracranial hemorrhage. No demarcated cortical infarct. No extra-axial fluid collection. No evidence of intracranial mass. No midline shift. Vascular: No hyperdense vessel.  Atherosclerotic calcifications Skull: Normal. Negative for fracture or focal lesion. Sinuses/Orbits: Redemonstrated bilateral proptosis. Rightward gaze. No significant paranasal sinus disease. ASPECTS [facility] Stroke Program Early CT Score) - Ganglionic level infarction (caudate, lentiform nuclei, internal capsule, insula, M1-M3 cortex): 7 - Supraganglionic infarction (M4-M6 cortex): 3 Total score (0-10 with 10 being normal): 10 These results were communicated to Dr. Cheral Marker at 12:40 pmon 5/2/2022by text page via the [facility] messaging system. IMPRESSION: No evidence of acute intracranial abnormality.  ASPECTS is 10. Redemonstrated bilateral proptosis. Electronically Signed   By: Kellie Simmering DO   On: 05/12/2020 12:41   CT ANGIO HEAD CODE STROKE  Result Date: 05/12/2020 CLINICAL DATA:  Left-sided weakness EXAM: CT ANGIOGRAPHY HEAD AND NECK TECHNIQUE: Multidetector CT imaging of the head and neck was performed using the standard protocol during bolus administration of intravenous contrast. Multiplanar CT image reconstructions and MIPs were obtained to evaluate the vascular anatomy. Carotid stenosis measurements (when applicable) are obtained utilizing NASCET criteria, using the distal internal carotid diameter as the denominator. CONTRAST:  84m OMNIPAQUE IOHEXOL 350 MG/ML SOLN COMPARISON:  Noncontrast head CT performed earlier  today 05/12/2020. FINDINGS: CTA NECK FINDINGS Aortic arch: Standard aortic branching. No hemodynamically significant innominate or proximal subclavian artery stenosis. Right carotid system: CCA and ICA patent within the neck without significant stenosis (50% or greater). Mild soft and calcified plaque within the carotid bifurcation and proximal ICA. Asymmetrically diminished enhancement of the cervical right ICA, likely due to downstream occlusion. Left carotid system: CCA and ICA patent within the neck without significant stenosis (50% or greater). Mild soft and calcified plaque within the carotid bifurcation and proximal ICA. Vertebral arteries: Patent within the neck without stenosis. Duplicated proximal left vertebral artery. Skeleton: No acute bony abnormality or aggressive osseous lesion. Cervical spondylosis. Other neck: Asymmetric prominence of the right thyroid lobe. Underlying right thyroid lobe nodule measuring at least 2.8 cm. Upper chest: Partially imaged left pleural effusion. Review of the MIP images confirms the above findings  CTA HEAD FINDINGS Anterior circulation: Asymmetrically diminished enhancement and caliber of the right ICA siphon, likely secondary to downstream occlusion. Absent enhancement within the right ICA terminus, proximal M1 right MCA and proximal A1 right ACA. Findings are compatible with T-shaped occlusion secondary to thrombus. There is some reconstitution of enhancement more distally within the M1 right MCA and within right M2 vessels. There is reconstitution of enhancement within the distal A1 right ACA. The intracranial left ICA is patent. No left M2 proximal branch occlusion or high-grade proximal stenosis is identified. Developmentally hypoplastic A1 left ACA. The A2 and more distal anterior cerebral arteries are patent. No intracranial aneurysm is identified. Posterior circulation: The intracranial vertebral arteries are patent. The basilar artery is patent. The posterior  cerebral arteries are patent. A right posterior communicating artery is present. The left posterior communicating artery is hypoplastic or absent. Venous sinuses: Within the limitations of contrast timing, no convincing thrombus. Anatomic variants: As described Review of the MIP images confirms the above findings Critical/emergent results were called by telephone at the time of interpretation on 05/12/2020 at 12:53 pm to provider [facility] , who verbally acknowledged these results. IMPRESSION: CTA neck: 1. The common carotid, internal carotid and vertebral arteries are patent within the neck without hemodynamically significant stenosis. Mild atherosclerotic disease within the carotid bifurcations and proximal ICAs. 2. Asymmetrically diminished enhancement of the cervical right ICA, likely secondary to downstream occlusion. 3. 2.8 cm right thyroid lobe nodule. Non-emergent thyroid ultrasound is recommended for further evaluation. CTA head: T-shaped large vessel occlusion involving the right ICA terminus, proximal M1 right middle cerebral artery and proximal A1 right anterior cerebral artery. Electronically Signed   By: Kellie Simmering DO   On: 05/12/2020 13:13    PHYSICAL EXAM  Temp:  [97.2 F (36.2 C)-98.8 F (37.1 C)] 97.2 F (36.2 C) (05/04 1131) Pulse Rate:  [59-206] 111 (05/04 1300) Resp:  [20-39] 32 (05/04 1300) BP: (91-162)/(45-134) 122/84 (05/04 1200) SpO2:  [87 %-99 %] 92 % (05/04 1300) Arterial Line BP: (83-164)/(52-88) 112/57 (05/04 1300) Weight:  [92.9 kg] 92.9 kg (05/04 0500)  General - Well nourished, well developed, in no apparent distress.  Ophthalmologic - fundi not visualized due to noncooperation.  Cardiovascular - irregularly irregular heart rate and rhythm.  Mental Status -  Level of arousal and orientation to time, place, and person were intact. Language including expression, naming, repetition, comprehension was assessed and found intact.  Cranial Nerves II - XII - II -  Visual field intact OU. III, IV, VI - Extraocular movements intact. V - Facial sensation intact bilaterally. VII - facial symmetrical VIII - Hearing & vestibular intact bilaterally. X - Palate elevates symmetrically. XI - Chin turning & shoulder shrug intact bilaterally. XII - Tongue protrusion intact.  Motor Strength - The patient's strength was symmetrical in all extremities and pronator drift was absent, BUE 4+/5 and BLEs 3/5 proximal and 4/5 distal.  Bulk was normal and fasciculations were absent.   Motor Tone - Muscle tone was assessed at the neck and appendages and was normal.  Reflexes - The patient's reflexes were symmetrical in all extremities and she had no pathological reflexes.  Sensory - Light touch, temperature/pinprick were assessed and were symmetrical.    Coordination - The patient had normal movements in the hands with no ataxia or dysmetria.  Tremor was absent.  Gait and Station - deferred.   ASSESSMENT/PLAN [name] [name] is a 70 y.o. female with history of PAF on Eliquis but stopped  1 week ago, PE, hypertension, diabetes, CHF, sick sinus syndrome, liver mass, bipolar disorder, COVID infection in 01/2020 admitted for left-sided weakness, left facial droop and slurred speech.. TPA was given.    Stroke:  right MCA several punctate infarct due to right terminal ICA occlusion status post tPA and IR with TICI3, etiology likely cardioembolic given extensive cardiac history  CT head no acute abnormality  CTA head and neck right ICA decreased flow with terminal ICA occlusion, right MCA and ACA occlusion from origin.  MRI right MCA several punctate infarcts  MRA successful vascular recanalization  2D Echo EF < 20%  LDL 61  HgbA1c 10.5  Heparin IV for VTE prophylaxis  Eliquis (apixaban) daily prior to admission, now on heparin IV. Timing to transition to po AC per HF team  Patient counseled to be compliant with her antithrombotic medications  Ongoing  aggressive stroke risk factor management  Therapy recommendations:  SNF  Disposition:  pending  Cardiogenic shock Cardiomyopathy PEA arrest Persistent afib  Cardiology HF team on board  Currently on amiodarone IV  On Heparin IV, timing to transition to po AC per HF team  Off pressor, on digoxin and lasix  Diabetes  HgbA1c 10.5 goal < 7.0  Uncontrolled  Currently on lantus  CBG monitoring  SSI  DM education and close PCP follow up  Hypotension due to cardiogenic shock . Unstable . On pressor . Cardiology HF team on board  Long term BP goal normotensive  Hyperlipidemia  Home meds:  none   LDL 61, goal < 70  Now on lipitor 80  Continue statin at discharge  Other Stroke Risk Factors  Advanced age  Obesity, Body mass index is 36.28 kg/m.   SSS  Hx of PE  Other Active Problems  Liver mass  Bipolar disorder  COVID 01/2020  Hospital day # 2  Neurology will sign off. Please call with questions. Pt will follow up with stroke clinic NP at [facility] in about 4 weeks. Thanks for the consult.   Rosalin Hawking, MD PhD Stroke Neurology 05/14/2020 1:59 PM    To contact Stroke Continuity provider, please refer to [email or web address] After hours, contact General Neurology

## 2020-05-14 NOTE — Progress Notes (Signed)
ANTICOAGULATION CONSULT NOTE Pharmacy Consult for Heparin Indication: atrial fibrillation  Allergies  Allergen Reactions  . Fruit & Vegetable Daily [Nutritional Supplements] Swelling and Other (See Comments)    All fruits cause tongue swelling and numbness Organic fruits are okay to eat  . Other Swelling    Tree nuts cause tongue swelling and numbness  . Peanut-Containing Drug Products Swelling and Other (See Comments)    Tongue swelling and numbness  . Aspirin Nausea And Vomiting and Other (See Comments)    Cannot take due to liver issues Other reaction(s): Unknown  . Tylenol [Acetaminophen] Other (See Comments)    Cannot take due to liver issues    Patient Measurements: Weight: 92.9 kg (204 lb 12.9 oz) Heparin Dosing Weight: 67 kg  Vital Signs: Temp: 98.1 F (36.7 C) (05/04 2337) Temp Source: Oral (05/04 2337) BP: 106/72 (05/04 2300) Pulse Rate: 109 (05/04 2300)  Labs: Recent Labs    05/12/20 1220 05/12/20 1228 05/12/20 1430 05/12/20 1550 05/12/20 1602 05/13/20 0527 05/14/20 0507 05/14/20 1246 05/14/20 2257  HGB 13.5 15.3*   < >  --  13.6 12.3 11.2*  --   --   HCT 42.8 45.0   < >  --  40.0 36.8 33.6*  --   --   PLT 242  --   --   --   --  235 203  --   --   APTT 26  --   --   --   --   --  92*  --   --   LABPROT 16.5*  --   --   --   --   --   --   --   --   INR 1.3*  --   --   --   --   --   --   --   --   HEPARINUNFRC  --   --   --   --   --   --   --  0.20* 0.13*  CREATININE 1.09* 0.90  --   --   --  1.19* 1.28*  --   --   TROPONINIHS  --   --   --  99*  --   --   --   --   --    < > = values in this interval not displayed.    Estimated Creatinine Clearance: 45.6 mL/min (A) (by C-G formula based on SCr of 1.28 mg/dL (H)).  Assessment: 70 yo female admitted with acute CVA s/p tPA 5/2, h/o Afib and Eliquis on hold, for heparin.   Goal of Therapy:  Heparin level 0.3 - 0.5 units/ml Monitor platelets by anticoagulation protocol: Yes   Plan:  Increase  Heparin 1200 units/hr Follow-up am labs.  Geannie Risen, PharmD, BCPS  05/14/2020

## 2020-05-14 NOTE — Progress Notes (Addendum)
SLP Cancellation Note  Patient Details Name: KALESE ENSZ MRN: [id number] DOB: 07/27/1950   Cancelled treatment:       Reason Eval/Treat Not Completed: Other (comment) Per review of chart, pt has passed the [place] swallow screen and has been started on a diet. RN has no concerns and says that pt has been swallowing well. Will defer evaluation for now, but please reorder with any acute concerns. Will f/u for completion of speech/language evaluation.    Mahala Menghini., M.A. CCC-SLP Acute Rehabilitation Services Pager [phone] Office [phone]  05/14/2020, 9:21 AM

## 2020-05-14 NOTE — Progress Notes (Addendum)
Advanced Heart Failure Rounding Note  PCP-Cardiologist: Kardie Tobb, DO   Subjective:    Initial co-ox 47%. Now on Milrinone 0.125. off NE. Co-ox 56% today.  Lactic acid 3.1>>2.4>>1.7   - 2L in UOP yesterday. SCr stable. CVP 12. No dyspnea.   ? Conversion to sinus tach. HR 110s. 12 lead EKG pending.    Sitting up in bed. Reports feeling better.    Objective:   Weight Range: 92.9 kg Body mass index is 36.28 kg/m.   Vital Signs:   Temp:  [97.5 F (36.4 C)-98.8 F (37.1 C)] 98 F (36.7 C) (05/04 0341) Pulse Rate:  [59-206] 115 (05/04 0600) Resp:  [20-39] 21 (05/04 0600) BP: (91-162)/(45-145) 110/59 (05/04 0600) SpO2:  [87 %-100 %] 95 % (05/04 0600) Arterial Line BP: (83-164)/(52-101) 119/62 (05/04 0600) FiO2 (%):  [40 %] 40 % (05/03 0820) Weight:  [92.9 kg] 92.9 kg (05/04 0500) Last BM Date:  (pta)  Weight change: Filed Weights   05/12/20 1200 05/13/20 0500 05/14/20 0500  Weight: 89.8 kg 90.1 kg 92.9 kg    Intake/Output:   Intake/Output Summary (Last 24 hours) at 05/14/2020 0711 Last data filed at 05/14/2020 0527 Gross per 24 hour  Intake 1250.51 ml  Output 1525 ml  Net -274.49 ml      Physical Exam    CVP 12 General:  Chronically ill appearing. No resp difficulty HEENT: Normal Neck: Supple. JVP 12 cm, Rt IJ CVC . Carotids 2+ bilat; no bruits. No lymphadenopathy or thyromegaly appreciated. Cor: PMI nondisplaced. Irregular rhythm, tachy rate. No rubs, gallops or murmurs. Lungs: decreased BS at the bases  Abdomen: Soft, nontender, nondistended. No hepatosplenomegaly. No bruits or masses. Good bowel sounds. Extremities: No cyanosis, clubbing, rash, trace bilateral LE edema Neuro: Alert & orientedx3, cranial nerves grossly intact. moves all 4 extremities w/o difficulty. Affect pleasant   Telemetry   ? Sinus tach vs AFL. HR 110s.   EKG    12 lead pending   Labs    CBC Recent Labs    05/12/20 1220 05/12/20 1228 05/13/20 0527 05/14/20 0507   WBC 6.2  --  13.1* 18.4*  NEUTROABS 3.4  --   --  15.5*  HGB 13.5   < > 12.3 11.2*  HCT 42.8   < > 36.8 33.6*  MCV 79.6*  --  75.4* 75.8*  PLT 242  --  235 203   < > = values in this interval not displayed.   Basic Metabolic Panel Recent Labs    63/01/60 1550 05/12/20 1602 05/13/20 0527 05/14/20 0507  NA  --    < > 134* 135  K  --    < > 3.5 3.1*  CL  --   --  102 100  CO2  --   --  22 26  GLUCOSE  --   --  166* 99  BUN  --   --  20 23  CREATININE  --   --  1.19* 1.28*  CALCIUM  --   --  8.5* 8.0*  MG 1.4*  --  2.4  --   PHOS 4.9*  --  4.5  --    < > = values in this interval not displayed.   Liver Function Tests Recent Labs    05/13/20 0527 05/14/20 0507  AST 141* 109*  ALT 77* 87*  ALKPHOS 109 133*  BILITOT 0.8 0.9  PROT 5.3* 5.1*  ALBUMIN 2.1* 2.1*   No results for input(s): LIPASE, AMYLASE  in the last 72 hours. Cardiac Enzymes No results for input(s): CKTOTAL, CKMB, CKMBINDEX, TROPONINI in the last 72 hours.  BNP: BNP (last 3 results) Recent Labs    03/03/20 1559  BNP 1,981.9*    ProBNP (last 3 results) No results for input(s): PROBNP in the last 8760 hours.   D-Dimer No results for input(s): DDIMER in the last 72 hours. Hemoglobin A1C Recent Labs    05/13/20 0527  HGBA1C 10.5*   Fasting Lipid Panel Recent Labs    05/13/20 0527  CHOL 87  HDL 17*  LDLCALC 61  TRIG 46  CHOLHDL 5.1   Thyroid Function Tests Recent Labs    05/13/20 0527  TSH 1.420    Other results:   Imaging    MR ANGIO HEAD WO CONTRAST  Result Date: 05/13/2020 CLINICAL DATA:  Follow-up stroke with intervention EXAM: MRI HEAD WITHOUT CONTRAST MRA HEAD WITHOUT CONTRAST TECHNIQUE: Multiplanar, multiecho pulse sequences of the brain and surrounding structures were obtained without intravenous contrast. Angiographic images of the head were obtained using MRA technique without contrast. COMPARISON:  CT and interventional studies of yesterday. FINDINGS: MRI HEAD  FINDINGS Brain: Diffusion imaging shows a few scattered subcentimeter and punctate infarctions in the right middle cerebral artery territory affecting the posterior frontal, parietal and parietooccipital brain. No large confluent infarction. No other vascular territory insult. Elsewhere, there are mild chronic small-vessel ischemic changes of the cerebral hemispheric white matter. No sign of swelling or hemorrhage. No hydrocephalus or extra-axial collection. Vascular: Major vessels at the base of the brain show flow. Skull and upper cervical spine: Negative Sinuses/Orbits: Clear/normal Other: None MRA HEAD FINDINGS Both internal carotid arteries are widely patent through the skull base and siphon regions. Left ICA supplies only the middle cerebral artery territory. Right ICA supplies the middle cerebral artery territory and both posterior cerebral artery territories. Major vessels all show flow. Both vertebral arteries widely patent to the basilar. No basilar stenosis. Posterior circulation branch vessels appear normal. IMPRESSION: Successful vascular recanalization. No large or medium vessel occlusion presently. Several scattered subcentimeter and punctate acute infarctions in the periphery of the right middle cerebral artery territory consistent with minimal embolic insults. No swelling or hemorrhage. Electronically Signed   By: Paulina Fusi M.D.   On: 05/13/2020 14:02   MR BRAIN WO CONTRAST  Result Date: 05/13/2020 CLINICAL DATA:  Follow-up stroke with intervention EXAM: MRI HEAD WITHOUT CONTRAST MRA HEAD WITHOUT CONTRAST TECHNIQUE: Multiplanar, multiecho pulse sequences of the brain and surrounding structures were obtained without intravenous contrast. Angiographic images of the head were obtained using MRA technique without contrast. COMPARISON:  CT and interventional studies of yesterday. FINDINGS: MRI HEAD FINDINGS Brain: Diffusion imaging shows a few scattered subcentimeter and punctate infarctions in the  right middle cerebral artery territory affecting the posterior frontal, parietal and parietooccipital brain. No large confluent infarction. No other vascular territory insult. Elsewhere, there are mild chronic small-vessel ischemic changes of the cerebral hemispheric white matter. No sign of swelling or hemorrhage. No hydrocephalus or extra-axial collection. Vascular: Major vessels at the base of the brain show flow. Skull and upper cervical spine: Negative Sinuses/Orbits: Clear/normal Other: None MRA HEAD FINDINGS Both internal carotid arteries are widely patent through the skull base and siphon regions. Left ICA supplies only the middle cerebral artery territory. Right ICA supplies the middle cerebral artery territory and both posterior cerebral artery territories. Major vessels all show flow. Both vertebral arteries widely patent to the basilar. No basilar stenosis. Posterior circulation branch vessels appear  normal. IMPRESSION: Successful vascular recanalization. No large or medium vessel occlusion presently. Several scattered subcentimeter and punctate acute infarctions in the periphery of the right middle cerebral artery territory consistent with minimal embolic insults. No swelling or hemorrhage. Electronically Signed   By: Paulina Fusi M.D.   On: 05/13/2020 14:02      Medications:     Scheduled Medications: . atorvastatin  80 mg Oral Daily  . Chlorhexidine Gluconate Cloth  6 each Topical Daily  . digoxin  0.125 mg Oral Daily  . furosemide  80 mg Intravenous BID  . insulin aspart  0-15 Units Subcutaneous Q4H  . insulin glargine  18 Units Subcutaneous QHS  . pantoprazole (PROTONIX) IV  40 mg Intravenous QHS  . potassium chloride  20 mEq Oral Daily     Infusions: . amiodarone 60 mg/hr (05/14/20 0527)  . clevidipine    . heparin 900 Units/hr (05/14/20 0627)  . milrinone 0.125 mcg/kg/min (05/14/20 0000)  . norepinephrine (LEVOPHED) Adult infusion Stopped (05/14/20 0342)  . potassium  chloride 10 mEq (05/14/20 0626)     PRN Medications:  oxyCODONE, senna-docusate    Assessment/Plan   1. 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 Potential TEE/DC-CV as needed - Co-ox improved but remains marginal at 56%. Continue Milrinone 0.125 - Remains fluid overloaded. CVP 12. Continue w/ IV Lasix   2. AF with RVR - amio for rate/rhythm control. Potential TEE/DC-CV as needed - supp K - continue heparin gtt   3. Acute embolic CVA  - s/p successful revascularization with systemic T-PA and mechanical thrombectomy - no clinical residual and no significant defect on MRI - c/w heparin gtt   4. Non-compliance  5. Bipolar d/o - will need outpatient f/u w/ mental health   6. Poorly controlled DM2 - hgb A1c 10.5  - insulin per primary team   7. PEA arrest - Had brief CPR and intubated. Now extubated   8. Moderate Pericardial Effusion w/o Tamponade  - c/w IV diuretics  - monitor hemodynamics   9. Hypokalemia - K 3.1 - supp w/ KCl   10. Leukocytosis  - WBC 13>>18K but AF. ? Reactive  - CXR yesterday w/ diffuse infiltrates/ edema - Check PCT  - Check UA and culture    Length of Stay: 2  Robbie Lis, PA-C  05/14/2020, 7:11 AM  Advanced Heart Failure Team Pager [phone] (M-F; 7a - 5p)  Please contact [facility] for night-coverage after hours (5p -7a ) and weekends on [email or web address]   Agree with above.    Move to 2H overnight. NE now off. On milrinone 0.125. Co-ox 56%.  Diuresing well. CVP down 18 -> 12. Remains on amio gtt. Rhythm more regular but suspect it may be atrial tach. Feeling better. Tolerating heparin.   General:  Sitting up in bed  No resp difficulty HEENT: normal Neck: supple.JVP to jaw . Carotids 2+ bilat; no bruits. No lymphadenopathy or thryomegaly appreciated. Cor: PMI nondisplaced.  Regular tachy + s3  Lungs: clear Abdomen: soft, nontender, nondistended. No hepatosplenomegaly. No bruits or masses. Good bowel sounds. Extremities: no cyanosis, clubbing, rash, 1+ edema Neuro: alert & orientedx3, cranial nerves grossly intact. moves all 4 extremities w/o difficulty. Affect pleasant  Shock improving with inotrope support. Can increase milrinone as needed but given AF will try to use lowest dose possible. Rhythm more regular but suspect it may  be atrial tach. Will continue amio and heparin. Continue diuresis today. Supp K. Try to mobilize.   CRITICAL CARE Performed by: Arvilla Meres  Total critical care time: 35 minutes  Critical care time was exclusive of separately billable procedures and treating other patients.  Critical care was necessary to treat or prevent imminent or life-threatening deterioration.  Critical care was time spent personally by me (independent of midlevel providers or residents) on the following activities: development of treatment plan with patient and/or surrogate as well as nursing, discussions with consultants, evaluation of patient's response to treatment, examination of patient, obtaining history from patient or surrogate, ordering and performing treatments and interventions, ordering and review of laboratory studies, ordering and review of radiographic studies, pulse oximetry and re-evaluation of patient's condition.  Arvilla Meres, MD  8:12 AM

## 2020-05-14 NOTE — Progress Notes (Signed)
eLink Physician-Brief Progress Note Patient Name: BRIELYN BOSAK DOB: 04/01/1950 MRN: [id number]   Date of Service  05/14/2020  HPI/Events of Note  K+ 3.1  eICU Interventions  KCL 10 meq iv Q 1 hour x 4 ordered.        Thomasene Lot Chantay Whitelock 05/14/2020, 6:12 AM

## 2020-05-14 NOTE — Progress Notes (Signed)
ANTICOAGULATION CONSULT NOTE  Pharmacy Consult for Heparin Indication: atrial fibrillation  Allergies  Allergen Reactions  . Fruit & Vegetable Daily [Nutritional Supplements] Swelling and Other (See Comments)    All fruits cause tongue swelling and numbness Organic fruits are okay to eat  . Other Swelling    Tree nuts cause tongue swelling and numbness  . Peanut-Containing Drug Products Swelling and Other (See Comments)    Tongue swelling and numbness  . Aspirin Nausea And Vomiting and Other (See Comments)    Cannot take due to liver issues Other reaction(s): Unknown  . Tylenol [Acetaminophen] Other (See Comments)    Cannot take due to liver issues    Patient Measurements: Weight: 92.9 kg (204 lb 12.9 oz) Heparin Dosing Weight: 67 kg  Vital Signs: Temp: 97.2 F (36.2 C) (05/04 1131) Temp Source: Oral (05/04 1131) BP: 122/84 (05/04 1200) Pulse Rate: 111 (05/04 1300)  Labs: Recent Labs    05/12/20 1220 05/12/20 1228 05/12/20 1430 05/12/20 1550 05/12/20 1602 05/13/20 0527 05/14/20 0507 05/14/20 1246  HGB 13.5 15.3*   < >  --  13.6 12.3 11.2*  --   HCT 42.8 45.0   < >  --  40.0 36.8 33.6*  --   PLT 242  --   --   --   --  235 203  --   APTT 26  --   --   --   --   --  92*  --   LABPROT 16.5*  --   --   --   --   --   --   --   INR 1.3*  --   --   --   --   --   --   --   HEPARINUNFRC  --   --   --   --   --   --   --  0.20*  CREATININE 1.09* 0.90  --   --   --  1.19* 1.28*  --   TROPONINIHS  --   --   --  99*  --   --   --   --    < > = values in this interval not displayed.    Estimated Creatinine Clearance: 45.6 mL/min (A) (by C-G formula based on SCr of 1.28 mg/dL (H)).   Medical History: Past Medical History:  Diagnosis Date  . Abdominal pain   . Atrial flutter (HCC)   . Back pain   . Chronic combined systolic and diastolic CHF (congestive heart failure) (HCC) 02/04/2020  . Constipation   . Diabetes mellitus without complication (HCC)   . Fatigue   .  Full dentures   . Hemorrhoids   . History of noncompliance with medical treatment, presenting hazards to health   . Hypertension   . Lack of adequate sleep   . Liver mass   . PAF (paroxysmal atrial fibrillation) (HCC)   . Rash    on right breast  . Wears glasses     Assessment: 41 yoF admitted with acute CVA s/p tPA 5/2. Pt on apixaban with hx AFib with poor compliance. Heparin started with no bolus and low goal.  Heparin level this afternoon subtherapeutic at 0.2, aPTT was previously elevated on 1000 units/h but heparin level likely more accurate so will dose off this and stop checking aPTTs.  Goal of Therapy:  Heparin level 0.3 - 0.5 units/ml APTT 66 - 85 secs Monitor platelets by anticoagulation protocol: Yes   Plan:  Increase heparin to 1000 units/h Recheck heparin level in 8h   Fredonia Highland, PharmD, [place], [facility] Clinical Pharmacist [phone] Please check AMION for all [facility] numbers 05/14/2020

## 2020-05-14 NOTE — Evaluation (Signed)
Physical Therapy Evaluation Patient Details Name: [name] [name] MRN: [id number] DOB: May 30, 1950 Today's Date: 05/14/2020   History of Present Illness  70 yo female L side weakness s/p TPA with mechanical thrombectomy followed by cardiac arrest receiving CPR for <5 minutes. CTA R MCA occlusion with successful revascualarization intubated 5/2 extubated 5/3 8:40am PMH DM chronic combined HF HTN afib 01/2020 COVID with afib rvr  Clinical Impression  Pt admitted with/for s/s of stroke, with cardiac arrest/CPR and intubation post revascularization.  Pt does not show signs of residual weaknesses or stroke symptoms, but is weak, painful and fearful during movement.  Pt needing min to mod assist on evaluation.  Pt currently limited functionally due to the problems listed. ( See problems list.)   Pt will benefit from PT to maximize function and safety in order to get ready for next venue listed below.     Follow Up Recommendations SNF;Other (comment);Supervision/Assistance - 24 hour (Pt doesn't want to be by herself, but will try to work on attaining Mod I level with return home and HHPT)    Equipment Recommendations  Other (comment) (TBA)    Recommendations for Other Services Rehab consult     Precautions / Restrictions Precautions Precautions: Fall      Mobility  Bed Mobility Overal bed mobility: Needs Assistance Bed Mobility: Supine to Sit     Supine to sit: Mod assist;HOB elevated     General bed mobility comments: rolling limited by R sided flank/ back pain.  supine to sit with mod assist.  minimal assist for scoot, pt expressing back pain.    Transfers Overall transfer level: Needs assistance Equipment used: 1 person hand held assist;None Transfers: Sit to/from [name] Sit to Stand: Min assist Stand pivot transfers: Min assist       General transfer comment: pt tentative and guarded, cues for hand placement, assist forward and  boost.  Ambulation/Gait             General Gait Details: pt deferred  Stairs            Wheelchair Mobility    Modified Rankin (Stroke Patients Only) Modified Rankin (Stroke Patients Only) Pre-Morbid Rankin Score: No symptoms Modified Rankin: No symptoms     Balance Overall balance assessment: Needs assistance;Mild deficits observed, not formally tested   Sitting balance-Leahy Scale: Fair       Standing balance-Leahy Scale: Poor Standing balance comment: pt could stand statically if she wasn't fearful.   Pt stood at EOB for 1-2 min working on w/shift, low amplitude steps in place.  Pt never releasing her firm grip on therapist during face to face assist                             Pertinent Vitals/Pain Pain Assessment: Faces Faces Pain Scale: Hurts little more Pain Location: R breast Pain Descriptors / Indicators: Aching;Jabbing Pain Intervention(s): Monitored during session    Home Living Family/patient expects to be discharged to:: Private residence Living Arrangements: Alone Available Help at Discharge: Family;Available PRN/intermittently Type of Home: Apartment Home Access: Level entry     Home Layout: One level Home Equipment: Cane - single point Additional Comments: just qualified for CNA    Prior Function Level of Independence: Independent               Hand Dominance        Extremity/Trunk Assessment   Upper Extremity Assessment Upper Extremity Assessment:  Defer to OT evaluation    Lower Extremity Assessment Lower Extremity Assessment: Overall WFL for tasks assessed (mild proximal weakness, but functional, NO FOCAL DEFICITS)       Communication   Communication: No difficulties  Cognition Arousal/Alertness: Awake/alert Behavior During Therapy: WFL for tasks assessed/performed Overall Cognitive Status:  (NT formally, follows 1 step commands, fair historian at best.)                                         General Comments General comments (skin integrity, edema, etc.): Sats on 2L once up slowly dropped to 86% and rose to 90/91% on 3L, HR during minimal activity at mid to upper 90's with spike into 100's bpm    Exercises     Assessment/Plan    PT Assessment Patient needs continued PT services  PT Problem List Decreased strength;Decreased activity tolerance;Decreased balance;Decreased mobility;Cardiopulmonary status limiting activity;Pain       PT Treatment Interventions Gait training;Functional mobility training;Therapeutic activities;Balance training;Patient/family education;DME instruction    PT Goals (Current goals can be found in the Care Plan section)  Acute Rehab PT Goals Patient Stated Goal: get back to independent PT Goal Formulation: With patient Time For Goal Achievement: 05/28/20 Potential to Achieve Goals: Good    Frequency Min 3X/week   Barriers to discharge        Co-evaluation               AM-PAC PT "6 Clicks" Mobility  Outcome Measure Help needed turning from your back to your side while in a flat bed without using bedrails?: A Lot Help needed moving from lying on your back to sitting on the side of a flat bed without using bedrails?: A Lot Help needed moving to and from a bed to a chair (including a wheelchair)?: A Little Help needed standing up from a chair using your arms (e.g., wheelchair or bedside chair)?: A Little Help needed to walk in hospital room?: A Little Help needed climbing 3-5 steps with a railing? : A Lot 6 Click Score: 15    End of Session   Activity Tolerance: Patient tolerated treatment well;Patient limited by fatigue Patient left: in chair;with call bell/phone within reach;with chair alarm set Nurse Communication: Mobility status PT Visit Diagnosis: Unsteadiness on feet (R26.81);Other abnormalities of gait and mobility (R26.89);Muscle weakness (generalized) (M62.81);Difficulty in walking, not elsewhere classified  (R26.2)    Time: 1127-1201 PT Time Calculation (min) (ACUTE ONLY): 34 min   Charges:   PT Evaluation $PT Eval Moderate Complexity: 1 Mod PT Treatments $Therapeutic Activity: 8-22 mins        05/14/2020  Jacinto Halim., PT Acute Rehabilitation Services [phone]  (pager) [phone]  (office)  [name] [name] 05/14/2020, 12:42 PM

## 2020-05-15 DIAGNOSIS — Z7189 Other specified counseling: Secondary | ICD-10-CM | POA: Diagnosis not present

## 2020-05-15 DIAGNOSIS — I639 Cerebral infarction, unspecified: Secondary | ICD-10-CM | POA: Diagnosis not present

## 2020-05-15 LAB — BASIC METABOLIC PANEL
Anion gap: 5 (ref 5–15)
BUN: 25 mg/dL — ABNORMAL HIGH (ref 8–23)
CO2: 26 mmol/L (ref 22–32)
Calcium: 7.8 mg/dL — ABNORMAL LOW (ref 8.9–10.3)
Chloride: 101 mmol/L (ref 98–111)
Creatinine, Ser: 1.27 mg/dL — ABNORMAL HIGH (ref 0.44–1.00)
GFR, Estimated: 46 mL/min — ABNORMAL LOW (ref >60)
Glucose, Bld: 116 mg/dL — ABNORMAL HIGH (ref 70–99)
Potassium: 4.3 mmol/L (ref 3.5–5.1)
Sodium: 132 mmol/L — ABNORMAL LOW (ref 135–145)

## 2020-05-15 LAB — URINE CULTURE: Culture: NO GROWTH

## 2020-05-15 LAB — CBC
HCT: 33.5 % — ABNORMAL LOW (ref 36.0–46.0)
Hemoglobin: 11 g/dL — ABNORMAL LOW (ref 12.0–15.0)
MCH: 25.2 pg — ABNORMAL LOW (ref 26.0–34.0)
MCHC: 32.8 g/dL (ref 30.0–36.0)
MCV: 76.7 fL — ABNORMAL LOW (ref 80.0–100.0)
Platelets: 215 10*3/uL (ref 150–400)
RBC: 4.37 MIL/uL (ref 3.87–5.11)
RDW: 15.1 % (ref 11.5–15.5)
WBC: 14 10*3/uL — ABNORMAL HIGH (ref 4.0–10.5)
nRBC: 0 % (ref 0.0–0.2)

## 2020-05-15 LAB — COOXEMETRY PANEL
Carboxyhemoglobin: 1 % (ref 0.5–1.5)
Methemoglobin: 0.7 % (ref 0.0–1.5)
O2 Saturation: 56.1 %
Total hemoglobin: 11.1 g/dL — ABNORMAL LOW (ref 12.0–16.0)

## 2020-05-15 LAB — GLUCOSE, CAPILLARY
Glucose-Capillary: 109 mg/dL — ABNORMAL HIGH (ref 70–99)
Glucose-Capillary: 115 mg/dL — ABNORMAL HIGH (ref 70–99)
Glucose-Capillary: 126 mg/dL — ABNORMAL HIGH (ref 70–99)
Glucose-Capillary: 143 mg/dL — ABNORMAL HIGH (ref 70–99)
Glucose-Capillary: 145 mg/dL — ABNORMAL HIGH (ref 70–99)
Glucose-Capillary: 149 mg/dL — ABNORMAL HIGH (ref 70–99)

## 2020-05-15 LAB — HEPARIN LEVEL (UNFRACTIONATED): Heparin Unfractionated: 0.3 IU/mL (ref 0.30–0.70)

## 2020-05-15 MED ORDER — PANTOPRAZOLE SODIUM 40 MG PO TBEC
40.0000 mg | DELAYED_RELEASE_TABLET | Freq: Every day | ORAL | Status: DC
  Administered 2020-05-15 – 2020-05-26 (×12): 40 mg via ORAL
  Filled 2020-05-15 (×12): qty 1

## 2020-05-15 MED ORDER — METOLAZONE 5 MG PO TABS
5.0000 mg | ORAL_TABLET | Freq: Every day | ORAL | Status: DC
  Administered 2020-05-15 – 2020-05-16 (×2): 5 mg via ORAL
  Filled 2020-05-15 (×2): qty 1

## 2020-05-15 MED ORDER — FUROSEMIDE 10 MG/ML IJ SOLN
15.0000 mg/h | INTRAVENOUS | Status: DC
  Administered 2020-05-15 – 2020-05-18 (×6): 15 mg/h via INTRAVENOUS
  Filled 2020-05-15 (×7): qty 20

## 2020-05-15 NOTE — Progress Notes (Signed)
    Progress Note from the Palliative Medicine Team at [facility]   Patient Name: [name] [name]       Date: 05/15/2020 DOB: August 17, 1950  Age: 70 y.o. MRN#: [id number] Attending Physician: Lynnell Catalan, MD Primary Care Physician: Dana Allan, MD Admit Date: 05/12/2020   Medical records reviewed   70 y.o. female  admitted on 05/12/2020 with  multiple medical problems presented with left sided weakness  Admitted with  Code Stroke with left sided weakness/facial droop. CTA shows vessel occlusion of the R ICA terminus, proximal M1 R MCA and proximal A1 R ACA with mild atherosclerotic disease within the carotid bifurcations and proximal ICAs,  required TPA and mechanical thrombectomy. Had a cardiac arrest immediately following her thrombectomy with CPR for <5 minutes.  Patient sent to IR for thrombectomy and was intubated for procedure. IR had succesful mechanical thrombectomy of the clot with complete vascularization. After procedure, patient became hypotensive and went into a PEA arrest. Chest compressions were started and patient receive a total of 2 epi. Patient stabilized. Central line placed and patient placed on pressors (Vaso, Epi, Neo). Patient transported to 4N ICU.  Extubated successfully.  Alert and oriented, daughter at bedside  Patient remains high risk for decompensation secondary to multiple comorbidities.  This NP visited patient at the bedside as a follow up for palliative medicine needs and emotional support.  Daughter/[name] at bedside.  Education offered on the current medical situation.  Questions and concerns addressed.  Patient and family remain open to all offered and available medical interventions to prolong life.  Hope is for return to baseline.  Discussed with patient the importance of continued conversation with family and their  medical providers regarding overall plan of care and treatment options,  ensuring decisions are within the context of the  patients values and GOCs.  Ms. symphonie schneiderman understanding.  Questions and concerns addressed     Total time spent on the unit was 25 minutes  Greater than 50% of the time was spent in counseling and coordination of care  Lorinda Creed NP  Palliative Medicine Team Team Phone # [phone] Pager [phone]

## 2020-05-15 NOTE — NC FL2 (Addendum)
[place] MEDICAID FL2 LEVEL OF CARE SCREENING TOOL     IDENTIFICATION  Patient Name: [name] [name] Birthdate: 10-23-1950 Sex: female Admission Date (Current Location): 05/12/2020  [facility] and [place] Number:  Producer, television/film/video and Address:  [facility], [address] [place], [place] [address]      Provider Number: [id number]  Attending Physician Name and Address:  Lynnell Catalan, MD  Relative Name and Phone Number:  Mariel Craft [phone]    Current Level of Care: Hospital Recommended Level of Care: Skilled Nursing Facility Prior Approval Number:   [id number]  Date Approved/Denied:   06/20/2020 PASRR Number: pending  Discharge Plan: SNF    Current Diagnoses: Patient Active Problem List   Diagnosis Date Noted  . 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 Stroke (HCC) 05/12/2020  . Acute respiratory failure with hypoxia (HCC)   . Bilateral leg edema 03/18/2020  . HFrEF (heart failure with reduced ejection fraction) (HCC) 03/18/2020  . Nonrheumatic tricuspid valve regurgitation 03/18/2020  . Gastroesophageal reflux disease 03/12/2020  . Wears glasses   . Liver mass   . Lack of adequate sleep   . Hypertension   . Full dentures   . Fatigue   . Diabetes mellitus without complication (HCC)   . SOB (shortness of breath)   . A-fib (HCC) 02/06/2020  . COVID-19 02/04/2020  . Chronic anticoagulation - on Eliquis for chronic afib 02/04/2020  . Chronic combined systolic and diastolic CHF (congestive heart failure) (HCC) 02/04/2020  . Oral thrush 02/04/2020  . Acute cystitis 02/04/2020  . Acute combined systolic and diastolic heart failure (HCC) 02/03/2020  . Morbid obesity (HCC) 02/03/2020  . Atrial fibrillation with RVR (HCC) 02/03/2020  . Dysphagia 02/03/2020  . Epigastric pain 02/03/2020  . Atrial tachycardia (HCC) 07/29/2019  . History of esophageal stricture 07/29/2019  . Atrial fibrillation with rapid ventricular response (HCC)  07/26/2019  . Rash 12/30/2017  . Healthcare maintenance 09/07/2017  . Osteopenia after menopause 06/09/2017  . Uncontrolled type 2 diabetes mellitus with hyperglycemia (HCC) 06/08/2017  . Back pain 11/15/2014  . Pericardial effusion 05/12/2010  . HTN (hypertension) 05/12/2010  . Mixed hyperlipidemia 05/12/2010    Orientation RESPIRATION BLADDER Height & Weight     Self,Time,Situation,Place  O2 (Nasal Cannula 2L) Continent,Indwelling catheter Weight: 204 lb 9.4 oz (92.8 kg) Height:     BEHAVIORAL SYMPTOMS/MOOD NEUROLOGICAL BOWEL NUTRITION STATUS      Continent Diet (See DC Summary)  AMBULATORY STATUS COMMUNICATION OF NEEDS Skin   Limited Assist Verbally Normal                       Personal Care Assistance Level of Assistance  Bathing,Feeding,Dressing Bathing Assistance: Limited assistance Feeding assistance: Limited assistance Dressing Assistance: Limited assistance     Functional Limitations Info  Sight,Hearing,Speech Sight Info: Adequate Hearing Info: Adequate Speech Info: Adequate    SPECIAL CARE FACTORS FREQUENCY  PT (By licensed PT),OT (By licensed OT)     PT Frequency: 5X/per week OT Frequency: 5X/per week            Contractures Contractures Info: Not present    Additional Factors Info  Code Status,Allergies Code Status Info: Full Allergies Info: Fruit & Vegetable Daily (Nutritional Supplements), Other, Peanut-containing Drug Products, Aspirin, Tylenol (Acetaminophen)           Current Medications (05/15/2020):  This is the current hospital active medication list Current Facility-Administered Medications  Medication Dose Route Frequency Provider Last Rate Last  Admin  . 0.9 %  sodium chloride infusion   Intravenous PRN Lupita Leash, MD   Stopped at 05/14/20 1243  . amiodarone (NEXTERONE PREMIX) 360-4.14 MG/200ML-% (1.8 mg/mL) IV infusion  60 mg/hr Intravenous Continuous Bensimhon, Bevelyn Buckles, MD 33.3 mL/hr at 05/15/20 1300 60 mg/hr at 05/15/20  1300  . atorvastatin (LIPITOR) tablet 80 mg  80 mg Oral Daily Bensimhon, Bevelyn Buckles, MD   80 mg at 05/15/20 3532  . Chlorhexidine Gluconate Cloth 2 % PADS 6 each  6 each Topical Daily Bensimhon, Bevelyn Buckles, MD   6 each at 05/15/20 1117  . digoxin (LANOXIN) tablet 0.125 mg  0.125 mg Oral Daily Bensimhon, Bevelyn Buckles, MD   0.125 mg at 05/15/20 9924  . furosemide (LASIX) 200 mg in dextrose 5 % 100 mL (2 mg/mL) infusion  15 mg/hr Intravenous Continuous Bensimhon, Bevelyn Buckles, MD 7.5 mL/hr at 05/15/20 1307 15 mg/hr at 05/15/20 1307  . heparin ADULT infusion 100 units/mL (25000 units/236mL)  1,250 Units/hr Intravenous Continuous Mosetta Anis, RPH 12.5 mL/hr at 05/15/20 1300 1,250 Units/hr at 05/15/20 1300  . insulin aspart (novoLOG) injection 0-15 Units  0-15 Units Subcutaneous Q4H Bensimhon, Bevelyn Buckles, MD   2 Units at 05/15/20 1146  . insulin glargine (LANTUS) injection 18 Units  18 Units Subcutaneous QHS Bensimhon, Bevelyn Buckles, MD   18 Units at 05/14/20 2125  . lidocaine (LIDODERM) 5 % 1 patch  1 patch Transdermal Q24H Lynnell Catalan, MD   1 patch at 05/14/20 1712  . metolazone (ZAROXOLYN) tablet 5 mg  5 mg Oral Daily Lynnell Catalan, MD   5 mg at 05/15/20 0956  . milrinone (PRIMACOR) 20 MG/100 ML (0.2 mg/mL) infusion  0.25 mcg/kg/min Intravenous Continuous Bensimhon, Bevelyn Buckles, MD 6.76 mL/hr at 05/15/20 1300 0.25 mcg/kg/min at 05/15/20 1300  . oxyCODONE (Oxy IR/ROXICODONE) immediate release tablet 5 mg  5 mg Oral Q4H PRN Migdalia Dk, MD   5 mg at 05/15/20 1147  . pantoprazole (PROTONIX) EC tablet 40 mg  40 mg Oral Daily Bensimhon, Bevelyn Buckles, MD   40 mg at 05/15/20 1312  . potassium chloride (KLOR-CON) packet 40 mEq  40 mEq Oral Daily Robbie Lis M, PA-C   40 mEq at 05/15/20 2683  . senna-docusate (Senokot-S) tablet 1 tablet  1 tablet Oral QHS PRN Bensimhon, Bevelyn Buckles, MD         Discharge Medications: Please see discharge summary for a list of discharge medications.  Relevant Imaging  Results:  Relevant Lab Results:   Additional Information SS#: [id number] Moderna COVID-19 Vaccine 08/07/19, 07/10/19  Orhan Mayorga, LCSWA

## 2020-05-15 NOTE — Progress Notes (Signed)
ANTICOAGULATION CONSULT NOTE  Pharmacy Consult for Heparin Indication: atrial fibrillation  Allergies  Allergen Reactions  . Fruit & Vegetable Daily [Nutritional Supplements] Swelling and Other (See Comments)    All fruits cause tongue swelling and numbness Organic fruits are okay to eat  . Other Swelling    Tree nuts cause tongue swelling and numbness  . Peanut-Containing Drug Products Swelling and Other (See Comments)    Tongue swelling and numbness  . Aspirin Nausea And Vomiting and Other (See Comments)    Cannot take due to liver issues Other reaction(s): Unknown  . Tylenol [Acetaminophen] Other (See Comments)    Cannot take due to liver issues    Patient Measurements: Weight: 92.8 kg (204 lb 9.4 oz) Heparin Dosing Weight: 67 kg  Vital Signs: Temp: 97.5 F (36.4 C) (05/05 0807) Temp Source: Oral (05/05 0807) BP: 129/90 (05/05 1000) Pulse Rate: 112 (05/05 1000)  Labs: Recent Labs    05/12/20 1220 05/12/20 1228 05/12/20 1550 05/12/20 1602 05/13/20 0527 05/14/20 0507 05/14/20 1246 05/14/20 2257 05/15/20 0335 05/15/20 0800  HGB 13.5   < >  --    < > 12.3 11.2*  --   --  11.0*  --   HCT 42.8   < >  --    < > 36.8 33.6*  --   --  33.5*  --   PLT 242  --   --   --  235 203  --   --  215  --   APTT 26  --   --   --   --  92*  --   --   --   --   LABPROT 16.5*  --   --   --   --   --   --   --   --   --   INR 1.3*  --   --   --   --   --   --   --   --   --   HEPARINUNFRC  --   --   --   --   --   --  0.20* 0.13*  --  0.30  CREATININE 1.09*   < >  --   --  1.19* 1.28*  --   --  1.27*  --   TROPONINIHS  --   --  99*  --   --   --   --   --   --   --    < > = values in this interval not displayed.    Estimated Creatinine Clearance: 45.9 mL/min (A) (by C-G formula based on SCr of 1.27 mg/dL (H)).   Medical History: Past Medical History:  Diagnosis Date  . Abdominal pain   . Atrial flutter (HCC)   . Back pain   . Chronic combined systolic and diastolic CHF  (congestive heart failure) (HCC) 02/04/2020  . Constipation   . Diabetes mellitus without complication (HCC)   . Fatigue   . Full dentures   . Hemorrhoids   . History of noncompliance with medical treatment, presenting hazards to health   . Hypertension   . Lack of adequate sleep   . Liver mass   . PAF (paroxysmal atrial fibrillation) (HCC)   . Rash    on right breast  . Wears glasses     Assessment: 81 yoF admitted with acute CVA s/p tPA 5/2. Pt on apixaban with hx AFib with poor compliance. Heparin started with no bolus  and low goal.  Heparin level therapeutic at 0.3, CBC stable.  Goal of Therapy:  Heparin level 0.3 - 0.5 units/ml APTT 66 - 85 secs Monitor platelets by anticoagulation protocol: Yes   Plan:  Increase heparin to 1250 units/h to keep in range Daily heparin level and CBC   Fredonia Highland, PharmD, [place], [facility] Clinical Pharmacist [phone] Please check AMION for all [facility] Pharmacy numbers 05/15/2020

## 2020-05-15 NOTE — TOC Initial Note (Addendum)
Transition of Care (TOC) - Initial/Assessment Note  Heart Failure   Patient Details  Name: [name] [name] MRN: [id number] Date of Birth: 08-23-1950  Transition of Care [facility]) CM/SW Contact:    Boleslaw Borghi, LCSWA Phone Number: 05/15/2020, 1:44 PM  Clinical Narrative:      CSW received consult for possible SNF placement at time of discharge. CSW spoke with patient and her daughter, [name] [name]. Patient expressed understanding of PT recommendation and is agreeable to SNF placement at time of discharge. Patient reports preference for CIR inside the hospital. CSW reached out to B. Boyette with CIR rehab admissions and PT to ask about patient possibly going to CIR however PT indicated that the patient isn't quite ready for CIR but if she starts progressing then possibly. CSW discussed insurance authorization process and provided Medicare SNF ratings list. Patient has received the COVID vaccines, Moderna COVID-19 Vaccine 08/07/19, 07/10/19. CSW also completed a very brief SDOH screening with the patient who denied having any needs at this time and that she gets a lot of support from her daughters. Patient reported they do have a PCP and they can get to the pharmacy to pick up their medications. Patient expressed being hopeful for rehab and to feel better soon. No further questions reported at this time.             CSW will continue to follow throughout discharge.  Expected Discharge Plan: Skilled Nursing Facility Barriers to Discharge: Continued Medical Work up   Patient Goals and CMS Choice Patient states their goals for this hospitalization and ongoing recovery are:: to go to CIR in the hospital for rehab CMS Medicare.gov Compare Post Acute Care list provided to:: Patient Choice offered to / list presented to : Patient,Adult Children  Expected Discharge Plan and Services Expected Discharge Plan: Skilled Nursing Facility In-house Referral: Clinical Social Work Discharge Planning Services: CM  Consult Post Acute Care Choice: Skilled Nursing Facility,IP Rehab (Patient and her daughter really want to go to CIR instead of SNF) Living arrangements for the past 2 months: Apartment                                      Prior Living Arrangements/Services Living arrangements for the past 2 months: Apartment Lives with:: Self Patient language and need for interpreter reviewed:: Yes Do you feel safe going back to the place where you live?: Yes      Need for Family Participation in Patient Care: No (Comment) Care giver support system in place?: Yes (comment) (Patient relies on her daughters for support but curently lives alone)   Criminal Activity/Legal Involvement Pertinent to Current Situation/Hospitalization: No - Comment as needed  Activities of Daily Living Home Assistive Devices/Equipment: Eyeglasses,Dentures (specify type),Shower chair with back ADL Screening (condition at time of admission) Patient's cognitive ability adequate to safely complete daily activities?: Yes Is the patient deaf or have difficulty hearing?: No Does the patient have difficulty seeing, even when wearing glasses/contacts?: No Does the patient have difficulty concentrating, remembering, or making decisions?: No Patient able to express need for assistance with ADLs?: Yes Does the patient have difficulty dressing or bathing?: Yes Independently performs ADLs?: No Communication: Independent Dressing (OT): Needs assistance Is this a change from baseline?: Change from baseline, expected to last >3 days Grooming: Independent Feeding: Needs assistance Is this a change from baseline?: Change from baseline, expected to last <3 days Bathing: Needs assistance Is  this a change from baseline?: Change from baseline, expected to last >3 days Toileting: Needs assistance Is this a change from baseline?: Change from baseline, expected to last >3days In/Out Bed: Needs assistance Is this a change from baseline?:  Change from baseline, expected to last >3 days Walks in Home: Needs assistance Is this a change from baseline?: Change from baseline, expected to last >3 days Does the patient have difficulty walking or climbing stairs?: Yes Weakness of Legs: Both Weakness of Arms/Hands: Both  Permission Sought/Granted Permission sought to share information with : Case Manager,Family Supports Permission granted to share information with : Yes, Verbal Permission Granted  Share Information with NAME: Mariel Craft  Permission granted to share info w AGENCY: CIR, SNF's  Permission granted to share info w Relationship: Daughter  Permission granted to share info w Contact Information: [phone]  Emotional Assessment Appearance:: Appears stated age Attitude/Demeanor/Rapport: Engaged Affect (typically observed): Pleasant Orientation: : Oriented to Self,Oriented to Place,Oriented to  Time,Oriented to Situation Alcohol / Substance Use: Not Applicable Psych Involvement: No (comment)  Admission diagnosis:  Stroke (HCC) [I63.9] Stroke (cerebrum) (HCC) [I63.9] Acute ischemic stroke [facility]) [I63.9] Patient Active Problem List   Diagnosis Date Noted  . 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 Stroke (HCC) 05/12/2020  . Acute respiratory failure with hypoxia (HCC)   . Bilateral leg edema 03/18/2020  . HFrEF (heart failure with reduced ejection fraction) (HCC) 03/18/2020  . Nonrheumatic tricuspid valve regurgitation 03/18/2020  . Gastroesophageal reflux disease 03/12/2020  . Wears glasses   . Liver mass   . Lack of adequate sleep   . Hypertension   . Full dentures   . Fatigue   . Diabetes mellitus without complication (HCC)   . SOB (shortness of breath)   . A-fib (HCC) 02/06/2020  . COVID-19 02/04/2020  . Chronic anticoagulation - on Eliquis for chronic afib 02/04/2020  . Chronic combined systolic and diastolic CHF (congestive heart failure) (HCC) 02/04/2020  . Oral thrush 02/04/2020  . Acute cystitis  02/04/2020  . Acute combined systolic and diastolic heart failure (HCC) 02/03/2020  . Morbid obesity (HCC) 02/03/2020  . Atrial fibrillation with RVR (HCC) 02/03/2020  . Dysphagia 02/03/2020  . Epigastric pain 02/03/2020  . Atrial tachycardia (HCC) 07/29/2019  . History of esophageal stricture 07/29/2019  . Atrial fibrillation with rapid ventricular response (HCC) 07/26/2019  . Rash 12/30/2017  . Healthcare maintenance 09/07/2017  . Osteopenia after menopause 06/09/2017  . Uncontrolled type 2 diabetes mellitus with hyperglycemia (HCC) 06/08/2017  . Back pain 11/15/2014  . Pericardial effusion 05/12/2010  . HTN (hypertension) 05/12/2010  . Mixed hyperlipidemia 05/12/2010   PCP:  Dana Allan, MD Pharmacy:   [facility] [phone] - [place] Otto, [place] - [address] AT [facility] OF [address] [place] [address] Phone: [phone] Fax: [phone]  [facility] [address] [place] [address] Phone: [phone] Fax: [phone]     Social Determinants of Health (SDOH) Interventions Food Insecurity Interventions: Intervention Not Indicated Financial Strain Interventions: Intervention Not Indicated Housing Interventions: Intervention Not Indicated Transportation Interventions: Intervention Not Indicated,Other (Comment) (Patient reported relying on family for tranpsortation needs)  Readmission Risk Interventions Readmission Risk Prevention Plan 02/07/2020  Post Dischage Appt Complete  Medication Screening Complete  Transportation Screening Complete  Some recent data might be hidden   Lyliana Dicenso, MSW, LCSWA [phone] Heart Failure Social Worker

## 2020-05-15 NOTE — Progress Notes (Addendum)
Advanced Heart Failure Rounding Note  PCP-Cardiologist: Kardie Tobb, DO   Subjective:    Initial co-ox 47%. Now on Milrinone 0.125. Off NE. Co-ox 56% today.  Lactic acid 3.1>>2.4>>1.7   CVP remains elevated ~16. Wt unchanged. Only 990c cc in UOP yesterday. Scr stable at 1.3. HR still elevated 100s-120s ? Atrial tach   OOB sitting up in chair. Denies dyspnea.   Objective:   Weight Range: 92.8 kg Body mass index is 36.24 kg/m.   Vital Signs:   Temp:  [97.2 F (36.2 C)-98.2 F (36.8 C)] 98.2 F (36.8 C) (05/05 0339) Pulse Rate:  [61-124] 103 (05/05 0700) Resp:  [17-36] 22 (05/05 0700) BP: (86-130)/(60-93) 102/71 (05/05 0600) SpO2:  [89 %-97 %] 91 % (05/05 0700) Arterial Line BP: (74-142)/(43-67) 122/64 (05/05 0700) Weight:  [92.8 kg] 92.8 kg (05/05 0500) Last BM Date:  (PTA)  Weight change: Filed Weights   05/13/20 0500 05/14/20 0500 05/15/20 0500  Weight: 90.1 kg 92.9 kg 92.8 kg    Intake/Output:   Intake/Output Summary (Last 24 hours) at 05/15/2020 0735 Last data filed at 05/15/2020 0500 Gross per 24 hour  Intake 1813.13 ml  Output 990 ml  Net 823.13 ml      Physical Exam    CVP 16  General:  Chronically ill appearing AAM, sitting up in chair. No resp difficulty HEENT: Normal Neck: Supple. JVP elevated to jaw, Rt IJ CVC . Carotids 2+ bilat; no bruits. No lymphadenopathy or thyromegaly appreciated. Cor: PMI nondisplaced. Irregular rhythm, tachy rate. No rubs, gallops or murmurs. Lungs: decreased BS at the bases L>R  Abdomen: Soft, nontender, nondistended. No hepatosplenomegaly. No bruits or masses. Good bowel sounds. Extremities: No cyanosis, clubbing, rash, trace LE edema on the left 1+ pretibial edema on the right Neuro: Alert & orientedx3, cranial nerves grossly intact. moves all 4 extremities w/o difficulty. Affect pleasant   Telemetry   ? Atrial tach, rate 110s-120s   EKG    No new EKG to review   Labs    CBC Recent Labs    05/12/20 1220  05/12/20 1228 05/14/20 0507 05/15/20 0335  WBC 6.2   < > 18.4* 14.0*  NEUTROABS 3.4  --  15.5*  --   HGB 13.5   < > 11.2* 11.0*  HCT 42.8   < > 33.6* 33.5*  MCV 79.6*   < > 75.8* 76.7*  PLT 242   < > 203 215   < > = values in this interval not displayed.   Basic Metabolic Panel Recent Labs    31/54/00 1550 05/12/20 1602 05/13/20 0527 05/14/20 0507 05/15/20 0335  NA  --    < > 134* 135 132*  K  --    < > 3.5 3.1* 4.3  CL  --   --  102 100 101  CO2  --   --  22 26 26   GLUCOSE  --   --  166* 99 116*  BUN  --   --  20 23 25*  CREATININE  --   --  1.19* 1.28* 1.27*  CALCIUM  --   --  8.5* 8.0* 7.8*  MG 1.4*  --  2.4  --   --   PHOS 4.9*  --  4.5  --   --    < > = values in this interval not displayed.   Liver Function Tests Recent Labs    05/13/20 0527 05/14/20 0507  AST 141* 109*  ALT 77* 87*  ALKPHOS 109 133*  BILITOT 0.8 0.9  PROT 5.3* 5.1*  ALBUMIN 2.1* 2.1*   No results for input(s): LIPASE, AMYLASE in the last 72 hours. Cardiac Enzymes No results for input(s): CKTOTAL, CKMB, CKMBINDEX, TROPONINI in the last 72 hours.  BNP: BNP (last 3 results) Recent Labs    03/03/20 1559  BNP 1,981.9*    ProBNP (last 3 results) No results for input(s): PROBNP in the last 8760 hours.   D-Dimer No results for input(s): DDIMER in the last 72 hours. Hemoglobin A1C Recent Labs    05/13/20 0527  HGBA1C 10.5*   Fasting Lipid Panel Recent Labs    05/13/20 0527  CHOL 87  HDL 17*  LDLCALC 61  TRIG 46  CHOLHDL 5.1   Thyroid Function Tests Recent Labs    05/13/20 0527  TSH 1.420    Other results:   Imaging    No results found.   Medications:     Scheduled Medications: . atorvastatin  80 mg Oral Daily  . Chlorhexidine Gluconate Cloth  6 each Topical Daily  . digoxin  0.125 mg Oral Daily  . furosemide  80 mg Intravenous BID  . insulin aspart  0-15 Units Subcutaneous Q4H  . insulin glargine  18 Units Subcutaneous QHS  . lidocaine  1 patch  Transdermal Q24H  . pantoprazole (PROTONIX) IV  40 mg Intravenous QHS  . potassium chloride  40 mEq Oral Daily    Infusions: . sodium chloride Stopped (05/14/20 1243)  . amiodarone 60 mg/hr (05/15/20 0506)  . heparin 1,200 Units/hr (05/15/20 0500)  . milrinone 0.125 mcg/kg/min (05/15/20 0500)  . norepinephrine (LEVOPHED) Adult infusion Stopped (05/14/20 0332)    PRN Medications: sodium chloride, oxyCODONE, senna-docusate    Assessment/Plan   1. 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 Potential TEE/DC-CV as needed - Co-ox improved but remains marginal at 56%. Continue Milrinone. May need increase to 0.25  - Remains fluid overloaded. CVP 16. Continue w/ IV Lasix 80 mg bid and give 2.5 of metolazone    2. AF with RVR - amio for rate/rhythm control. Potential TEE/DC-CV as needed - supp K - continue heparin gtt   3. Acute embolic CVA  - s/p successful revascularization with systemic T-PA and mechanical thrombectomy - no clinical residual and no significant defect on MRI - c/w heparin gtt   4. Non-compliance  5. Bipolar d/o - will need outpatient f/u w/ mental health   6. Poorly controlled DM2 - hgb A1c 10.5  - insulin per primary team   7. PEA arrest - Had brief CPR and intubated. Now extubated   8. Moderate Pericardial Effusion w/o Tamponade  - c/w IV diuretics  - monitor hemodynamics   9. Hypokalemia - resolved, K 4.3 today  - supp PRN w/ diuresis   10. Leukocytosis  - WBC 13>>18>>14K but AF. ? Reactive  - CXR 5/3 w/ diffuse infiltrates/ edema - PCT elevated 2.75 but in setting of renal insuffiencey  - UA negative  - no abx for now w/ improving WBC and no fevers. Monitor closely    Length of Stay: 3  Knute Neu  05/15/2020, 7:35 AM  Advanced Heart Failure Team Pager [phone] (M-F; 7a - 5p)   Please contact [facility] for night-coverage after hours (5p -7a ) and weekends on [email or web address]  Agree with above  She remains quite tenuous.  Co-ox marginal. In/out AF. Not diuresing very well.   General:  Lying in bed Weak appearing. No resp difficulty HEENT: normal Neck: supple.JVP to ear Carotids 2+ bilat; no bruits. No lymphadenopathy or thryomegaly appreciated. Cor: PMI nondisplaced. Irregular tachy  No rubs, gallops or murmurs. Lungs: clear Abdomen: soft, nontender, nondistended. No hepatosplenomegaly. No bruits or masses. Good bowel sounds. Extremities: no cyanosis, clubbing, rash, 2-3+ edema Neuro: alert & orientedx3, cranial nerves grossly intact. moves all 4 extremities w/o difficulty. Affect pleasant  Hemodynamically still quite marginal .Increase milrinone to 0.25. Start lasix gtt. Place UNNA boots. Continue IV amio and heparin. Will need TEE/DC-CV next week when volume status better.   CRITICAL CARE Performed by: Arvilla Meres  Total critical care time: 35 minutes  Critical care time was exclusive of separately billable procedures and treating other patients.  Critical care was necessary to treat or prevent imminent or life-threatening deterioration.  Critical care was time spent personally by me (independent of midlevel providers or residents) on the following activities: development of treatment plan with patient and/or surrogate as well as nursing, discussions with consultants, evaluation of patient's response to treatment, examination of patient, obtaining history from patient or surrogate, ordering and performing treatments and interventions, ordering and review of laboratory studies, ordering and review of radiographic studies, pulse oximetry and re-evaluation of patient's condition.  Arvilla Meres, MD  12:02 PM

## 2020-05-15 NOTE — Evaluation (Signed)
Speech Language Pathology Evaluation Patient Details Name: PHYLLIS ABELSON MRN: [id number] DOB: 05/24/1950 Today's Date: 05/15/2020 Time: 1242-1300 SLP Time Calculation (min) (ACUTE ONLY): 18 min  Problem List:  Patient Active Problem List   Diagnosis Date Noted  . 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 Stroke (HCC) 05/12/2020  . Acute respiratory failure with hypoxia (HCC)   . Bilateral leg edema 03/18/2020  . HFrEF (heart failure with reduced ejection fraction) (HCC) 03/18/2020  . Nonrheumatic tricuspid valve regurgitation 03/18/2020  . Gastroesophageal reflux disease 03/12/2020  . Wears glasses   . Liver mass   . Lack of adequate sleep   . Hypertension   . Full dentures   . Fatigue   . Diabetes mellitus without complication (HCC)   . SOB (shortness of breath)   . A-fib (HCC) 02/06/2020  . COVID-19 02/04/2020  . Chronic anticoagulation - on Eliquis for chronic afib 02/04/2020  . Chronic combined systolic and diastolic CHF (congestive heart failure) (HCC) 02/04/2020  . Oral thrush 02/04/2020  . Acute cystitis 02/04/2020  . Acute combined systolic and diastolic heart failure (HCC) 02/03/2020  . Morbid obesity (HCC) 02/03/2020  . Atrial fibrillation with RVR (HCC) 02/03/2020  . Dysphagia 02/03/2020  . Epigastric pain 02/03/2020  . Atrial tachycardia (HCC) 07/29/2019  . History of esophageal stricture 07/29/2019  . Atrial fibrillation with rapid ventricular response (HCC) 07/26/2019  . Rash 12/30/2017  . Healthcare maintenance 09/07/2017  . Osteopenia after menopause 06/09/2017  . Uncontrolled type 2 diabetes mellitus with hyperglycemia (HCC) 06/08/2017  . Back pain 11/15/2014  . Pericardial effusion 05/12/2010  . HTN (hypertension) 05/12/2010  . Mixed hyperlipidemia 05/12/2010   Past Medical History:  Past Medical History:  Diagnosis Date  . Abdominal pain   . Atrial flutter (HCC)   . Back pain   . Chronic combined systolic and diastolic CHF (congestive heart  failure) (HCC) 02/04/2020  . Constipation   . Diabetes mellitus without complication (HCC)   . Fatigue   . Full dentures   . Hemorrhoids   . History of noncompliance with medical treatment, presenting hazards to health   . Hypertension   . Lack of adequate sleep   . Liver mass   . PAF (paroxysmal atrial fibrillation) (HCC)   . Rash    on right breast  . Wears glasses    Past Surgical History:  Past Surgical History:  Procedure Laterality Date  . BREAST DUCTAL SYSTEM EXCISION    . BREAST EXCISIONAL BIOPSY Left   . CARDIOVERSION N/A 07/31/2019   Procedure: CARDIOVERSION;  Surgeon: Chrystie Nose, MD;  Location: [facility] ENDOSCOPY;  Service: Cardiovascular;  Laterality: N/A;  . CHOLECYSTECTOMY    . CHOLECYSTECTOMY, LAPAROSCOPIC  07/24/2010  . ESOPHAGEAL DILATION    . IR CT HEAD LTD  05/12/2020  . IR PERCUTANEOUS ART THROMBECTOMY/INFUSION INTRACRANIAL INC DIAG ANGIO  05/12/2020      . IR PERCUTANEOUS ART THROMBECTOMY/INFUSION INTRACRANIAL INC DIAG ANGIO  05/12/2020  . IR US GUIDE VASC ACCESS RIGHT  05/12/2020  . RADIOLOGY WITH ANESTHESIA N/A 05/12/2020   Procedure: IR WITH ANESTHESIA;  Surgeon: Radiologist, Medication, MD;  Location: [facility] OR;  Service: Radiology;  Laterality: N/A;  . TEE WITHOUT CARDIOVERSION N/A 07/31/2019   Procedure: TRANSESOPHAGEAL ECHOCARDIOGRAM (TEE);  Surgeon: Chrystie Nose, MD;  Location: [facility] ENDOSCOPY;  Service: Cardiovascular;  Laterality: N/A;   HPI:  Illness 70 yo female admitted 05/12/20 with L side weakness s/p TPA with mechanical thrombectomy followed by cardiac arrest receiving CPR for <5 minutes.  CTA R MCA occlusion with successful revascualarization intubated 5/2 extubated 5/3. PMHx: DM chronic combined HF, HTN, afib, 01/2020 COVID   Assessment / Plan / Recommendation Clinical Impression  Pts cognitive linguistic skills appear grossly within functional limits. Pt denies changes with thinking skills and daughter present who states pt is at baseline level of  functioning. Informal portions of cognitive asessement revealed mild deficits in novel recall. PLOF pt lives in an apartment community and has supportive family locally. Receptive and expressive language and motor speech skills were intact. No furtherST needs identified.    SLP Assessment  SLP Recommendation/Assessment: Patient does not need any further Speech Lanaguage Pathology Services SLP Visit Diagnosis: Cognitive communication deficit (R41.841)    Follow Up Recommendations  Other (comment) (per PT/OT recommendations)    Frequency and Duration           SLP Evaluation Cognition  Overall Cognitive Status: Within Functional Limits for tasks assessed Arousal/Alertness: Awake/alert Orientation Level: Oriented to person;Oriented to place;Oriented to time;Oriented to situation Attention: Focused;Sustained Focused Attention: Appears intact Sustained Attention: Appears intact Memory: Appears intact Awareness: Appears intact Problem Solving: Appears intact Safety/Judgment: Appears intact       Comprehension  Auditory Comprehension Overall Auditory Comprehension: Appears within functional limits for tasks assessed    Expression Expression Primary Mode of Expression: Verbal Verbal Expression Overall Verbal Expression: Appears within functional limits for tasks assessed Written Expression Dominant Hand: Right   Oral / Motor  Oral Motor/Sensory Function Overall Oral Motor/Sensory Function: Within functional limits Motor Speech Overall Motor Speech: Appears within functional limits for tasks assessed   GO                    Maryela Tapper H. MA, CCC-SLP Acute Rehabilitation Services   05/15/2020, 1:10 PM

## 2020-05-15 NOTE — Progress Notes (Signed)
Physical Therapy Treatment Patient Details Name: [name] [name] MRN: [id number] DOB: [birth year] Today's Date: 05/15/2020    History of Present Illness 70 yo female admitted 05/12/20 with L side weakness s/p TPA with mechanical thrombectomy followed by cardiac arrest receiving CPR for <5 minutes. CTA R MCA occlusion with successful revascualarization intubated 5/2 extubated 5/3. PMHx: DM chronic combined HF, HTN, afib, 01/2020 COVID    PT Comments    Pt initially hesitant to move stating she gets dizzy and nauseated with movement. Upon standing from chair no negative symptoms with BP in standing 152/81 (103) with pt able to progress to limited gait with great difficulty clearing her feet. 2 gait trials and seated HEP with pt able to tolerate well. Pt educated for progression and need for improved gait to work toward ultimate goal of home.   HR 108 at rest, 134 with standing, 120-135 with limited gait SpO2 92% on 2L with drop to 85% during gait, 4L maintained 95% but difficulty maintaining accurate pleth even with new probe Seated 151/92 (108) Standing 152/81 (103)   Follow Up Recommendations  SNF;Other (comment);Supervision/Assistance - 24 hour     Equipment Recommendations       Recommendations for Other Services       Precautions / Restrictions Precautions Precautions: Fall Precaution Comments: watch sats/HR Restrictions Weight Bearing Restrictions: No    Mobility  Bed Mobility               General bed mobility comments: pt in chair on arrival and end of session    Transfers Overall transfer level: Needs assistance   Transfers: Sit to/from Stand Sit to Stand: Min guard         General transfer comment: cues for hand placement, increased time x 2 trials  Ambulation/Gait Ambulation/Gait assistance: Min guard Gait Distance (Feet): 14 Feet Assistive device: Rolling walker (2 wheeled) Gait Pattern/deviations: Shuffle   Gait velocity interpretation: <1.8  ft/sec, indicate of risk for recurrent falls General Gait Details: pt with shuffling gait unable to significantly lift feet with reliance on RW. Pt walked 5' forward and back (10') then 7' (14') after seated rest. Cues for posture, step length and breathing. HR 125-133 with gait   Stairs             Wheelchair Mobility    Modified Rankin (Stroke Patients Only) Modified Rankin (Stroke Patients Only) Pre-Morbid Rankin Score: No symptoms Modified Rankin: Moderate disability     Balance Overall balance assessment: Needs assistance;Mild deficits observed, not formally tested   Sitting balance-Leahy Scale: Fair     Standing balance support: Bilateral upper extremity supported Standing balance-Leahy Scale: Poor Standing balance comment: bil UE on RW                            Cognition Arousal/Alertness: Awake/alert Behavior During Therapy: Flat affect Overall Cognitive Status: Within Functional Limits for tasks assessed                                        Exercises General Exercises - Lower Extremity Long Arc Quad: AROM;Both;Seated;15 reps Hip Flexion/Marching: AROM;Both;15 reps;Seated    General Comments        Pertinent Vitals/Pain Pain Assessment: 0-10 Pain Score: 5  Pain Location: right hip and chest Pain Descriptors / Indicators: Aching Pain Intervention(s): Limited activity within patient's tolerance;Monitored during session;Premedicated  before session;Repositioned    Home Living                      Prior Function            PT Goals (current goals can now be found in the care plan section) Progress towards PT goals: Progressing toward goals    Frequency    Min 3X/week      PT Plan Current plan remains appropriate    Co-evaluation              AM-PAC PT "6 Clicks" Mobility   Outcome Measure  Help needed turning from your back to your side while in a flat bed without using bedrails?: A  Little Help needed moving from lying on your back to sitting on the side of a flat bed without using bedrails?: A Little Help needed moving to and from a bed to a chair (including a wheelchair)?: A Little Help needed standing up from a chair using your arms (e.g., wheelchair or bedside chair)?: A Little Help needed to walk in hospital room?: A Lot Help needed climbing 3-5 steps with a railing? : A Lot 6 Click Score: 16    End of Session   Activity Tolerance: Patient tolerated treatment well Patient left: in chair;with call bell/phone within reach Nurse Communication: Mobility status PT Visit Diagnosis: Unsteadiness on feet (R26.81);Other abnormalities of gait and mobility (R26.89);Muscle weakness (generalized) (M62.81);Difficulty in walking, not elsewhere classified (R26.2)     Time: 0826-0900 PT Time Calculation (min) (ACUTE ONLY): 34 min  Charges:  $Gait Training: 8-22 mins $Therapeutic Exercise: 8-22 mins                     [name] [name] P, PT Acute Rehabilitation Services Pager: [phone] Office: [phone]    Lamine Laton B Blane Worthington 05/15/2020, 10:21 AM

## 2020-05-15 NOTE — Progress Notes (Cosign Needed)
RE: [name] [name] Date of Birth: Apr 13, 1950 Date: 05/15/2020  Please be advised that the above-named patient will require a short-term nursing home stay - anticipated 30 days or less for rehabilitation and strengthening.  The plan is for return home.

## 2020-05-15 NOTE — Progress Notes (Signed)
Inpatient Rehabilitation Admissions Coordinator   Discussed with PT, Maija and SW, Cortlin. Patient and family requesting CIR rather than SNF. PT states patient not yet at level to tolerate.. We will await further progress.  Ottie Glazier, RN, MSN Rehab Admissions Coordinator [phone] 05/15/2020 2:13 PM

## 2020-05-16 DIAGNOSIS — I639 Cerebral infarction, unspecified: Secondary | ICD-10-CM | POA: Diagnosis not present

## 2020-05-16 LAB — CBC
HCT: 32.5 % — ABNORMAL LOW (ref 36.0–46.0)
Hemoglobin: 10.5 g/dL — ABNORMAL LOW (ref 12.0–15.0)
MCH: 25.1 pg — ABNORMAL LOW (ref 26.0–34.0)
MCHC: 32.3 g/dL (ref 30.0–36.0)
MCV: 77.6 fL — ABNORMAL LOW (ref 80.0–100.0)
Platelets: 225 10*3/uL (ref 150–400)
RBC: 4.19 MIL/uL (ref 3.87–5.11)
RDW: 15.3 % (ref 11.5–15.5)
WBC: 10.2 10*3/uL (ref 4.0–10.5)
nRBC: 0 % (ref 0.0–0.2)

## 2020-05-16 LAB — RETICULOCYTES
Immature Retic Fract: 25.7 % — ABNORMAL HIGH (ref 2.3–15.9)
RBC.: 4.37 MIL/uL (ref 3.87–5.11)
Retic Count, Absolute: 65.6 10*3/uL (ref 19.0–186.0)
Retic Ct Pct: 1.5 % (ref 0.4–3.1)

## 2020-05-16 LAB — BASIC METABOLIC PANEL
Anion gap: 7 (ref 5–15)
BUN: 20 mg/dL (ref 8–23)
CO2: 30 mmol/L (ref 22–32)
Calcium: 8 mg/dL — ABNORMAL LOW (ref 8.9–10.3)
Chloride: 92 mmol/L — ABNORMAL LOW (ref 98–111)
Creatinine, Ser: 1.32 mg/dL — ABNORMAL HIGH (ref 0.44–1.00)
GFR, Estimated: 44 mL/min — ABNORMAL LOW (ref >60)
Glucose, Bld: 163 mg/dL — ABNORMAL HIGH (ref 70–99)
Potassium: 4 mmol/L (ref 3.5–5.1)
Sodium: 129 mmol/L — ABNORMAL LOW (ref 135–145)

## 2020-05-16 LAB — GLUCOSE, CAPILLARY
Glucose-Capillary: 139 mg/dL — ABNORMAL HIGH (ref 70–99)
Glucose-Capillary: 175 mg/dL — ABNORMAL HIGH (ref 70–99)
Glucose-Capillary: 197 mg/dL — ABNORMAL HIGH (ref 70–99)
Glucose-Capillary: 135 mg/dL — ABNORMAL HIGH (ref 70–99)
Glucose-Capillary: 170 mg/dL — ABNORMAL HIGH (ref 70–99)
Glucose-Capillary: 154 mg/dL — ABNORMAL HIGH (ref 70–99)
Glucose-Capillary: 142 mg/dL — ABNORMAL HIGH (ref 70–99)

## 2020-05-16 LAB — IRON AND TIBC
Iron: 20 ug/dL — ABNORMAL LOW (ref 28–170)
Saturation Ratios: 7 % — ABNORMAL LOW (ref 10.4–31.8)
TIBC: 305 ug/dL (ref 250–450)
UIBC: 285 ug/dL

## 2020-05-16 LAB — VITAMIN B12: Vitamin B-12: 2001 pg/mL — ABNORMAL HIGH (ref 180–914)

## 2020-05-16 LAB — COOXEMETRY PANEL
Carboxyhemoglobin: 1.2 % (ref 0.5–1.5)
Methemoglobin: 0.9 % (ref 0.0–1.5)
O2 Saturation: 62.6 %
Total hemoglobin: 12.6 g/dL (ref 12.0–16.0)

## 2020-05-16 LAB — HEPARIN LEVEL (UNFRACTIONATED): Heparin Unfractionated: 0.48 IU/mL (ref 0.30–0.70)

## 2020-05-16 LAB — FOLATE: Folate: 11.2 ng/mL (ref >5.9)

## 2020-05-16 LAB — FERRITIN: Ferritin: 124 ng/mL (ref 11–307)

## 2020-05-16 LAB — MAGNESIUM: Magnesium: 1.5 mg/dL — ABNORMAL LOW (ref 1.7–2.4)

## 2020-05-16 MED ORDER — FAMOTIDINE 20 MG PO TABS
20.0000 mg | ORAL_TABLET | Freq: Every day | ORAL | Status: DC
  Administered 2020-05-16 – 2020-05-26 (×11): 20 mg via ORAL
  Filled 2020-05-16 (×11): qty 1

## 2020-05-16 MED ORDER — ONDANSETRON HCL 4 MG/2ML IJ SOLN
4.0000 mg | Freq: Once | INTRAMUSCULAR | Status: AC
  Administered 2020-05-16: 4 mg via INTRAVENOUS
  Filled 2020-05-16: qty 2

## 2020-05-16 MED ORDER — SIMETHICONE 80 MG PO CHEW
80.0000 mg | CHEWABLE_TABLET | Freq: Four times a day (QID) | ORAL | Status: DC | PRN
  Administered 2020-05-19: 80 mg via ORAL
  Filled 2020-05-16 (×2): qty 1

## 2020-05-16 MED ORDER — SODIUM CHLORIDE 0.9 % IV SOLN
510.0000 mg | Freq: Once | INTRAVENOUS | Status: AC
  Administered 2020-05-16: 510 mg via INTRAVENOUS
  Filled 2020-05-16: qty 17

## 2020-05-16 MED ORDER — ENSURE ENLIVE PO LIQD
237.0000 mL | Freq: Two times a day (BID) | ORAL | Status: DC
  Administered 2020-05-16 – 2020-05-26 (×12): 237 mL via ORAL

## 2020-05-16 MED ORDER — MAGNESIUM SULFATE 4 GM/100ML IV SOLN
4.0000 g | Freq: Once | INTRAVENOUS | Status: AC
  Administered 2020-05-16: 4 g via INTRAVENOUS
  Filled 2020-05-16: qty 100

## 2020-05-16 MED ORDER — SORBITOL 70 % SOLN
30.0000 mL | Freq: Once | Status: AC
  Administered 2020-05-16: 30 mL via ORAL
  Filled 2020-05-16: qty 30

## 2020-05-16 NOTE — Progress Notes (Signed)
Physical Therapy Treatment Patient Details Name: [name] [name] MRN: [id number] DOB: [birth year] Today's Date: 05/16/2020    History of Present Illness 70 yo female admitted 05/12/20 with L side weakness s/p TPA with mechanical thrombectomy followed by cardiac arrest receiving CPR for <5 minutes. CTA R MCA occlusion with successful revascualarization intubated 5/2 extubated 5/3. PMHx: DM chronic combined HF, HTN, afib, 01/2020 COVID    PT Comments    Pt with increased activity tolerance, gait and functional mobility this session. Pt reports dizziness and nausea but no pain this session. Pt states lack of sleep and multiple issues making her feel poorly. Pt benefits from encouragement and reassurance to maximize mobility. Discussed CIR vs SNF with pt and she is in clear agreement today that she is not yet up for the aggressive therapy of CIR yet and will continue to work toward improved mobility and function.   HR 108-112 sats 91-99% on 3L Pre gait 133/78 (90), post 144/79 (96)    Follow Up Recommendations  SNF;Supervision/Assistance - 24 hour     Equipment Recommendations  Rolling walker with 5" wheels    Recommendations for Other Services       Precautions / Restrictions Precautions Precautions: Fall Precaution Comments: watch sats/HR    Mobility  Bed Mobility Overal bed mobility: Needs Assistance Bed Mobility: Sit to Supine     Supine to sit: Min assist     General bed mobility comments: min assist to transition back to bed with cues for sequence    Transfers Overall transfer level: Needs assistance   Transfers: Sit to/from Stand Sit to Stand: Min guard         General transfer comment: cues for hand placement with pt able to stand from chair x 2  Ambulation/Gait Ambulation/Gait assistance: Min guard Gait Distance (Feet): 74 Feet Assistive device: Rolling walker (2 wheeled) Gait Pattern/deviations: Shuffle   Gait velocity interpretation: <1.8 ft/sec,  indicate of risk for recurrent falls General Gait Details: pt with slow shuffling gait with mod cues for increasing distance, step length and encouragement to maximize distance. Chair follow with pt able to walk 62' then return same distance after seated rest on 3L with SpO2 92-100%, HR 108-112   Stairs             Wheelchair Mobility    Modified Rankin (Stroke Patients Only) Modified Rankin (Stroke Patients Only) Pre-Morbid Rankin Score: No symptoms Modified Rankin: Moderate disability     Balance Overall balance assessment: Needs assistance;Mild deficits observed, not formally tested   Sitting balance-Leahy Scale: Fair     Standing balance support: Bilateral upper extremity supported Standing balance-Leahy Scale: Poor Standing balance comment: bil UE on RW                            Cognition Arousal/Alertness: Awake/alert Behavior During Therapy: Flat affect Overall Cognitive Status: Within Functional Limits for tasks assessed                                        Exercises General Exercises - Lower Extremity Long Arc Quad: AROM;Both;Seated;20 reps Hip Flexion/Marching: AROM;Both;Seated;20 reps    General Comments        Pertinent Vitals/Pain Pain Assessment: No/denies pain Pain Location: no pain nausea    Home Living  Prior Function            PT Goals (current goals can now be found in the care plan section) Progress towards PT goals: Progressing toward goals    Frequency    Min 3X/week      PT Plan Current plan remains appropriate    Co-evaluation              AM-PAC PT "6 Clicks" Mobility   Outcome Measure  Help needed turning from your back to your side while in a flat bed without using bedrails?: A Little Help needed moving from lying on your back to sitting on the side of a flat bed without using bedrails?: A Little Help needed moving to and from a bed to a chair  (including a wheelchair)?: A Little Help needed standing up from a chair using your arms (e.g., wheelchair or bedside chair)?: A Little Help needed to walk in hospital room?: A Lot Help needed climbing 3-5 steps with a railing? : Total 6 Click Score: 15    End of Session Equipment Utilized During Treatment: Oxygen Activity Tolerance: Patient tolerated treatment well Patient left: in bed;with call bell/phone within reach;with bed alarm set Nurse Communication: Mobility status PT Visit Diagnosis: Unsteadiness on feet (R26.81);Other abnormalities of gait and mobility (R26.89);Muscle weakness (generalized) (M62.81);Difficulty in walking, not elsewhere classified (R26.2)     Time: 0350-0938 PT Time Calculation (min) (ACUTE ONLY): 27 min  Charges:  $Gait Training: 8-22 mins $Therapeutic Activity: 8-22 mins                     Jden Want P, PT Acute Rehabilitation Services Pager: [phone] Office: [phone]    Farra Nikolic B Renard Caperton 05/16/2020, 9:45 AM

## 2020-05-16 NOTE — Progress Notes (Signed)
Mg 1.5 Replaced per protocol  

## 2020-05-16 NOTE — Progress Notes (Signed)
Initial Nutrition Assessment  DOCUMENTATION CODES:   Not applicable  INTERVENTION:   Ensure Enlive po BID, each supplement provides 350 kcal and 20 grams of protein. This supplement is Lactose Free  Encouraged smaller, more frequent meals given early satiety  Consider liberalizing diet to Regular if po intake does not improve  NUTRITION DIAGNOSIS:   Inadequate oral intake related to decreased appetite,early satiety as evidenced by per patient/family report.  GOAL:   Patient will meet greater than or equal to 90% of their needs  MONITOR:   PO intake,Supplement acceptance,Labs,Weight trends  REASON FOR ASSESSMENT:   Rounds    ASSESSMENT:   71 yo female admitted with acute on chronic CHF, acute embolic CVA. PMH includes HTN, medical noncompliance, HLD, CHF LVEF 25-50%, poorly controlled DM  5/02 Code Stroke, Thrombectomy, PEA arrest 5/03 Extubated  +nausea. Pt did not eat breakfast this AM, trying to eat some lunch on visit today  Recorded po intake 10-50%; pt reports poor appetite with complaints of early satiety. Pt reports she takes a few bites and then feels full  Pt reports she has been eating 2 meals per day; typically eggs with bacon for breakfast and then spaghetti, hot dog or hamburger for the other meal. Pt does not normally take nutritional supplements  Pt reports with her GERD and GB removal in the past, she has to avoid certain foods. Pt also reports she cannot tolerate lactose.   Labs: sodium 129 (L) Meds: lasix drip, ss novolog, lantus, KcL  Diet Order:   Diet Order            Diet Heart Room service appropriate? Yes with Assist; Fluid consistency: Thin  Diet effective now                 EDUCATION NEEDS:   Education needs have been addressed  Skin:  Skin Assessment: Reviewed RN Assessment  Last BM:  no documented BM  Height:   Ht Readings from Last 1 Encounters:  03/18/20 5\' 3"  (1.6 m)    Weight:   Wt Readings from Last 1  Encounters:  05/16/20 92.5 kg    BMI:  Body mass index is 36.12 kg/m.  Estimated Nutritional Needs:   Kcal:  1700-1900 kcals  Protein:  85-95 g  Fluid:  >/= 1.7 L   07/16/20 MS, RDN, LDN, CNSC Registered Dietitian III Clinical Nutrition RD Pager and On-Call Pager Number Located in [place]

## 2020-05-16 NOTE — Progress Notes (Addendum)
Advanced Heart Failure Rounding Note  PCP-Cardiologist: Kardie Tobb, DO   Subjective:    5/5 Milrinone increased to 0.25 mcg. Off norepi. Diuresed with lasix drip 15 mg + metolazone.   Remains on milrinone 0.25 mcg. CO-OX 63%.   Diuresing with lasix drip at 15 mg per hour. Negative 1.7 liters.   Creatinine 1.3>1.3   Complaining of nausea when sitting up.  Had blood in sputum.    Objective:   Weight Range: 92.5 kg Body mass index is 36.12 kg/m.   Vital Signs:   Temp:  [97.6 F (36.4 C)-98.3 F (36.8 C)] 97.7 F (36.5 C) (05/06 0753) Pulse Rate:  [53-132] 111 (05/06 0700) Resp:  [10-30] 21 (05/06 0700) BP: (108-161)/(56-101) 161/78 (05/06 0700) SpO2:  [86 %-100 %] 94 % (05/06 0700) Arterial Line BP: (122-128)/(65-68) 128/68 (05/05 1000) Weight:  [92.5 kg] 92.5 kg (05/06 0500) Last BM Date:  (PTA)  Weight change: Filed Weights   05/14/20 0500 05/15/20 0500 05/16/20 0500  Weight: 92.9 kg 92.8 kg 92.5 kg    Intake/Output:   Intake/Output Summary (Last 24 hours) at 05/16/2020 0853 Last data filed at 05/16/2020 0700 Gross per 24 hour  Intake 1352.44 ml  Output 3400 ml  Net -2047.56 ml      Physical Exam  CVP 11-12  General:   No resp difficulty HEENT: normal Neck: supple. JVP 11-12 . Carotids 2+ bilat; no bruits. No lymphadenopathy or thryomegaly appreciated. RIJ  Cor: PMI nondisplaced. Regular rate & rhythm. No rubs, gallops or murmurs. Lungs: clear Abdomen: soft, nontender, nondistended. No hepatosplenomegaly. No bruits or masses. Good bowel sounds. Extremities: no cyanosis, clubbing, rash, R and LLE 2+ edema Neuro: alert & orientedx3, cranial nerves grossly intact. moves all 4 extremities w/o difficulty. Affect pleasant   Telemetry   Atrial tach 110. Personally reviewed   EKG    No new EKG to review   Labs    CBC Recent Labs    05/14/20 0507 05/15/20 0335 05/16/20 0319  WBC 18.4* 14.0* 10.2  NEUTROABS 15.5*  --   --   HGB 11.2* 11.0*  10.5*  HCT 33.6* 33.5* 32.5*  MCV 75.8* 76.7* 77.6*  PLT 203 215 225   Basic Metabolic Panel Recent Labs    16/10/96 0335 05/16/20 0319  NA 132* 129*  K 4.3 4.0  CL 101 92*  CO2 26 30  GLUCOSE 116* 163*  BUN 25* 20  CREATININE 1.27* 1.32*  CALCIUM 7.8* 8.0*  MG  --  1.5*   Liver Function Tests Recent Labs    05/14/20 0507  AST 109*  ALT 87*  ALKPHOS 133*  BILITOT 0.9  PROT 5.1*  ALBUMIN 2.1*   No results for input(s): LIPASE, AMYLASE in the last 72 hours. Cardiac Enzymes No results for input(s): CKTOTAL, CKMB, CKMBINDEX, TROPONINI in the last 72 hours.  BNP: BNP (last 3 results) Recent Labs    03/03/20 1559  BNP 1,981.9*    ProBNP (last 3 results) No results for input(s): PROBNP in the last 8760 hours.   D-Dimer No results for input(s): DDIMER in the last 72 hours. Hemoglobin A1C No results for input(s): HGBA1C in the last 72 hours. Fasting Lipid Panel No results for input(s): CHOL, HDL, LDLCALC, TRIG, CHOLHDL, LDLDIRECT in the last 72 hours. Thyroid Function Tests No results for input(s): TSH, T4TOTAL, T3FREE, THYROIDAB in the last 72 hours.  Invalid input(s): FREET3  Other results:   Imaging    No results found.   Medications:  Scheduled Medications: . atorvastatin  80 mg Oral Daily  . Chlorhexidine Gluconate Cloth  6 each Topical Daily  . digoxin  0.125 mg Oral Daily  . insulin aspart  0-15 Units Subcutaneous Q4H  . insulin glargine  18 Units Subcutaneous QHS  . lidocaine  1 patch Transdermal Q24H  . metolazone  5 mg Oral Daily  . pantoprazole  40 mg Oral Daily  . potassium chloride  40 mEq Oral Daily    Infusions: . sodium chloride Stopped (05/16/20 0600)  . amiodarone 60 mg/hr (05/16/20 0700)  . furosemide (LASIX) 200 mg in dextrose 5% 100 mL (2mg /mL) infusion 15 mg/hr (05/16/20 0700)  . heparin 1,250 Units/hr (05/16/20 0700)  . milrinone 0.25 mcg/kg/min (05/16/20 0738)    PRN Medications: sodium chloride, oxyCODONE,  senna-docusate    Assessment/Plan   1. 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 Potential TEE/DC-CV as needed - CO-OX 63% on milrinone 0.25 mcg.   - Volume status improving. Continue lasix 15 mg per hour + metolazone.  - Renal function stable.  - Add unna boots.   2. AF with RVR - amio for rate/rhythm control. Potential TEE/DC-CV as needed - Continue amio drip - continue heparin gtt . Has blood in sputum. Hgb drifting down . Watch closely.   3. Acute embolic CVA  - s/p successful revascularization with systemic T-PA and mechanical thrombectomy - no clinical residual and no significant defect on MRI - c/w heparin gtt   4. Non-compliance  5. Bipolar d/o - will need outpatient f/u w/ mental health   6. Poorly controlled DM2 - hgb A1c 10.5  - insulin per primary team   7. PEA arrest - Had brief CPR and intubated. N - On 2 liters Maribel.   8. Moderate Pericardial Effusion w/o Tamponade  - c/w IV diuretics  - monitor hemodynamics   9. Hypokalemia - Resolved.   10. Leukocytosis  - WBC 13>>18>>14K> 10 but AF. ? Reactive  - CXR 5/3 w/ diffuse infiltrates/ edema - PCT elevated 2.75 but in setting of renal insuffiencey  - UA negative   11. Anemia Hgb drifting down from 15--->10.5  Had some blood in sputum?  Check Anemia panel.    Length of Stay: 4  [name] Clegg, NP  05/16/2020, 8:53 AM  Advanced Heart Failure Team Pager [phone] (M-F; 7a - 5p)  Please contact [facility] for night-coverage after hours (5p -7a ) and weekends on [email or web address]  Agree with above.   Remains very tenuous. Co-ox improved with increased milrinone. Diuresing more. Weight down 1 pound. Having nausea and some blood in sputum. Rhythm looks like atrial tach.   General:  Weak appearing. No resp difficulty HEENT: normal Neck: supple. no JVP to jaw  Carotids 2+ bilat; no bruits. No lymphadenopathy or thryomegaly appreciated. Cor: PMI nondisplaced. Regular tachy No rubs, gallops or murmurs. Lungs: clear Abdomen: soft, nontender, nondistended. No hepatosplenomegaly. No bruits or masses. Good bowel sounds. Extremities: no cyanosis, clubbing, rash, 1-2+ edema Neuro: alert & orientedx3, cranial nerves grossly intact. moves all 4 extremities w/o difficulty. Affect pleasant  Continue milrinone at 0.25. Continue diuresis. Will give sorbitol for constipation. Check UA.   CRITICAL CARE Performed by: [id number]  Total critical care time: 35 minutes  Critical care time was exclusive of separately billable procedures and treating other patients.  Critical care was necessary to treat or  prevent imminent or life-threatening deterioration.  Critical care was time spent personally by me (independent of midlevel providers or residents) on the following activities: development of treatment plan with patient and/or surrogate as well as nursing, discussions with consultants, evaluation of patient's response to treatment, examination of patient, obtaining history from patient or surrogate, ordering and performing treatments and interventions, ordering and review of laboratory studies, ordering and review of radiographic studies, pulse oximetry and re-evaluation of patient's condition.  Arvilla Meres, MD  9:53 AM

## 2020-05-16 NOTE — Progress Notes (Signed)
NAME:  [name] [name], MRN:  [id number], DOB:  1950/11/19, LOS: 4 ADMISSION DATE:  05/12/2020, CONSULTATION DATE:  5/2 REFERRING MD:  Corliss Skains, CHIEF COMPLAINT:  Left sided weakness, facial droop   History of Present Illness:  70 y/o female with multiple medical problems presented with left sided weakness and required TPA and mechanical thrombectomy.  Had a cardiac arrest immediately following her thrombectomy with CPR for < 5 minutes.    Pertinent  Medical History  Atrial fibrillation Medication non-compliance Systolic heart failure, LVEF 25-30% Hypertension Tricuspid regurgitation COVID 19 Hyperlipidemia  Significant Hospital Events: Including procedures, antibiotic start and stop dates in addition to other pertinent events   5/2 - Patient admitted to hospital Code Stroke with left sided weakness/facial droop. CTA shows vessel occlusion of the R ICA terminus, proximal M1 R MCA and proximal A1 R ACA with mild atherosclerotic disease within the carotid bifurcations and proximal ICAs.  Cleviprex and Labetolol started for Afib with RVR and HTN. Patient sent to IR for thrombectomy and was intubated for procedure. IR had succesful mechanical thrombectomy of the clot with complete vascularization.  After procedure, patient became hypotensive and went into a PEA arrest. Chest compressions were started and patient receive a total of 2 epi. Patient stabilized. Central line placed and patient placed on pressors (Vaso, Epi, Neo). Patient transported to 4N ICU. Came off vasopressors, was noted to be moving all four extremities after arrival to ICU 5/3 - Extubated, seen by HF   Interim History / Subjective:  Extubated . Mobilizing to chair today. Right arm still weak but improving.  Right chest pain improved with lidocaine patch.  Objective   Blood pressure (!) 161/119, pulse (!) 108, temperature 97.9 F (36.6 C), temperature source Oral, resp. rate (!) 21, weight 92.5 kg, SpO2 96 %. CVP:  [17  mmHg] 17 mmHg      Intake/Output Summary (Last 24 hours) at 05/16/2020 1215 Last data filed at 05/16/2020 1000 Gross per 24 hour  Intake 1599.43 ml  Output 4250 ml  Net -2650.57 ml   Filed Weights   05/14/20 0500 05/15/20 0500 05/16/20 0500  Weight: 92.9 kg 92.8 kg 92.5 kg    Examination:  General:  Sitting in chair HENT: no icterus, Oral mucosa moist.  PULM:chest clear. Tenderness to palpation of right back.  CV: HS normal. Still tachycardic.  JVP elevated GI: BS+, soft, nontender MSK: normal bulk and tone Neuro: awake, alert, following commands, moving all four extremities. RUE 4/5 strength.   Labs/imaging that I havepersonally reviewed  (right click and "Reselect all SmartList Selections" daily)  Hypokalemia.   Resolved Hospital Problem list     Assessment & Plan:   Was critically ill due to acute respiratory failure with hypoxemia> improved Acute pulmonary edema Cardiogenic shock, acute systolic heart failure, adequate UOP overnight Atrial fibrillation Non-compliance with home medicines Acute ischemic stroke to R common carotid artery, embolic, s/p TPA and mechanical thrombectomy with apparent good clinical result AKI, due to cardiogenic shock MSK chest pain from CPR  Plan:  -Continue current diuretic strategy -Continue milrinone and amiodarone IV. -Continue current pain control strategy.  Best practice (right click and "Reselect all SmartList Selections" daily)  Diet:  Oral advance diet per SLP  Pain/Anxiety/Delirium protocol (if indicated): No VAP protocol (if indicated): Not indicated DVT prophylaxis: Systemic AC GI prophylaxis: N/A Glucose control:  SSI Yes Central venous access:  Yes, and it is still needed Arterial line:  Yes, and it is still needed Foley:  Yes,  and it is still needed Mobility:  bed rest  PT consulted: Yes Last date of multidisciplinary goals of care discussion [5/2: [name] and daughter [name]] Code Status:  full  code Disposition: remain in ICU  Labs   CBC: Recent Labs  Lab 05/12/20 1220 05/12/20 1228 05/12/20 1602 05/13/20 0527 05/14/20 0507 05/15/20 0335 05/16/20 0319  WBC 6.2  --   --  13.1* 18.4* 14.0* 10.2  NEUTROABS 3.4  --   --   --  15.5*  --   --   HGB 13.5   < > 13.6 12.3 11.2* 11.0* 10.5*  HCT 42.8   < > 40.0 36.8 33.6* 33.5* 32.5*  MCV 79.6*  --   --  75.4* 75.8* 76.7* 77.6*  PLT 242  --   --  235 203 215 225   < > = values in this interval not displayed.    Basic Metabolic Panel: Recent Labs  Lab 05/12/20 1220 05/12/20 1228 05/12/20 1430 05/12/20 1550 05/12/20 1602 05/13/20 0527 05/14/20 0507 05/15/20 0335 05/16/20 0319  NA 135 137   < >  --  136 134* 135 132* 129*  K 3.4* 3.4*   < >  --  3.3* 3.5 3.1* 4.3 4.0  CL 100 101  --   --   --  102 100 101 92*  CO2 22  --   --   --   --  22 26 26 30   GLUCOSE 257* 253*  --   --   --  166* 99 116* 163*  BUN 15 17  --   --   --  20 23 25* 20  CREATININE 1.09* 0.90  --   --   --  1.19* 1.28* 1.27* 1.32*  CALCIUM 8.8*  --   --   --   --  8.5* 8.0* 7.8* 8.0*  MG  --   --   --  1.4*  --  2.4  --   --  1.5*  PHOS  --   --   --  4.9*  --  4.5  --   --   --    < > = values in this interval not displayed.   GFR: Estimated Creatinine Clearance: 44 mL/min (A) (by C-G formula based on SCr of 1.32 mg/dL (H)). Recent Labs  Lab 05/12/20 1550 05/12/20 2156 05/13/20 0527 05/13/20 1722 05/14/20 0507 05/14/20 0817 05/15/20 0335 05/16/20 0319  PROCALCITON  --   --   --   --   --  2.75  --   --   WBC  --   --  13.1*  --  18.4*  --  14.0* 10.2  LATICACIDVEN 3.1* 2.4*  --  1.7  --   --   --   --     Liver Function Tests: Recent Labs  Lab 05/12/20 1220 05/13/20 0527 05/14/20 0507  AST 47* 141* 109*  ALT 26 77* 87*  ALKPHOS 106 109 133*  BILITOT 1.0 0.8 0.9  PROT 6.4* 5.3* 5.1*  ALBUMIN 2.6* 2.1* 2.1*   No results for input(s): LIPASE, AMYLASE in the last 168 hours. No results for input(s): AMMONIA in the last 168  hours.  ABG    Component Value Date/Time   PHART 7.401 05/12/2020 1602   PCO2ART 37.6 05/12/2020 1602   PO2ART 349 (H) 05/12/2020 1602   HCO3 23.5 05/12/2020 1602   TCO2 25 05/12/2020 1602   ACIDBASEDEF 1.0 05/12/2020 1602   O2SAT 62.6 05/16/2020 0319  Coagulation Profile: Recent Labs  Lab 05/12/20 1220  INR 1.3*    Cardiac Enzymes: No results for input(s): CKTOTAL, CKMB, CKMBINDEX, TROPONINI in the last 168 hours.  HbA1C: HbA1c, POC (controlled diabetic range)  Date/Time Value Ref Range Status  12/30/2017 10:02 AM 9.4 (A) 0.0 - 7.0 % Final  09/06/2017 01:31 PM 8.8 (A) 0.0 - 7.0 % Final   HbA1c POC (<> result, manual entry)  Date/Time Value Ref Range Status  07/26/2019 11:08 AM >15.0 4.0 - 5.6 % Final   Hgb A1c MFr Bld  Date/Time Value Ref Range Status  05/13/2020 05:27 AM 10.5 (H) 4.8 - 5.6 % Final    Comment:    (NOTE) Pre diabetes:          5.7%-6.4%  Diabetes:              >6.4%  Glycemic control for   <7.0% adults with diabetes   02/04/2020 06:57 AM 10.4 (H) 4.8 - 5.6 % Final    Comment:    (NOTE) Pre diabetes:          5.7%-6.4%  Diabetes:              >6.4%  Glycemic control for   <7.0% adults with diabetes     CBG: Recent Labs  Lab 05/15/20 1952 05/15/20 2338 05/16/20 0347 05/16/20 0910 05/16/20 1122  GLUCAP 115* 143* 139* 175* 197*    CRITICAL CARE Performed by: Lynnell Catalan   Total critical care time: 35 minutes  Critical care time was exclusive of separately billable procedures and treating other patients.  Critical care was necessary to treat or prevent imminent or life-threatening deterioration.  Critical care was time spent personally by me on the following activities: development of treatment plan with patient and/or surrogate as well as nursing, discussions with consultants, evaluation of patient's response to treatment, examination of patient, obtaining history from patient or surrogate, ordering and performing  treatments and interventions, ordering and review of laboratory studies, ordering and review of radiographic studies, pulse oximetry, re-evaluation of patient's condition and participation in multidisciplinary rounds.  Lynnell Catalan, MD [facility] ICU Physician [facility] [place] Critical Care  Pager: [phone] Mobile: [phone] After hours: [phone].

## 2020-05-16 NOTE — Progress Notes (Signed)
NAME:  [name] [name], MRN:  [id number], DOB:  09/30/1950, LOS: 4 ADMISSION DATE:  05/12/2020, CONSULTATION DATE:  5/2 REFERRING MD:  Corliss Skains, CHIEF COMPLAINT:  Left sided weakness, facial droop   History of Present Illness:  70 y/o female with multiple medical problems presented with left sided weakness and required TPA and mechanical thrombectomy.  Had a cardiac arrest immediately following her thrombectomy with CPR for < 5 minutes.    Pertinent  Medical History  Atrial fibrillation Medication non-compliance Systolic heart failure, LVEF 25-30% Hypertension Tricuspid regurgitation COVID 19 Hyperlipidemia  Significant Hospital Events: Including procedures, antibiotic start and stop dates in addition to other pertinent events   5/2 - Patient admitted to hospital Code Stroke with left sided weakness/facial droop. CTA shows vessel occlusion of the R ICA terminus, proximal M1 R MCA and proximal A1 R ACA with mild atherosclerotic disease within the carotid bifurcations and proximal ICAs.  Cleviprex and Labetolol started for Afib with RVR and HTN. Patient sent to IR for thrombectomy and was intubated for procedure. IR had succesful mechanical thrombectomy of the clot with complete vascularization.  After procedure, patient became hypotensive and went into a PEA arrest. Chest compressions were started and patient receive a total of 2 epi. Patient stabilized. Central line placed and patient placed on pressors (Vaso, Epi, Neo). Patient transported to 4N ICU. Came off vasopressors, was noted to be moving all four extremities after arrival to ICU 5/3 - Extubated, seen by HF   Interim History / Subjective:  Extubated .  Complains of nausea.  Objective   Blood pressure (!) 161/119, pulse (!) 108, temperature 97.9 F (36.6 C), temperature source Oral, resp. rate (!) 21, weight 92.5 kg, SpO2 96 %. CVP:  [17 mmHg] 17 mmHg      Intake/Output Summary (Last 24 hours) at 05/16/2020 1219 Last data  filed at 05/16/2020 1000 Gross per 24 hour  Intake 1599.43 ml  Output 4250 ml  Net -2650.57 ml   Filed Weights   05/14/20 0500 05/15/20 0500 05/16/20 0500  Weight: 92.9 kg 92.8 kg 92.5 kg    Examination:  General: Lying in bed HENT: no icterus, Oral mucosa moist.  PULM:chest clear. Tenderness to palpation of right back.  Has improved CV: HS normal. Still tachycardic.  JVP elevated GI: BS+, soft, nontender MSK: normal bulk and tone Neuro: awake, alert, following commands, moving all four extremities. RUE 4/5 strength.   Labs/imaging that I havepersonally reviewed  (right click and "Reselect all SmartList Selections" daily)  Hypokalemia.   Resolved Hospital Problem list     Assessment & Plan:   Was critically ill due to acute respiratory failure with hypoxemia> improved Acute pulmonary edema Cardiogenic shock, acute systolic heart failure, adequate UOP overnight Atrial fibrillation Non-compliance with home medicines Acute ischemic stroke to R common carotid artery, embolic, s/p TPA and mechanical thrombectomy with apparent good clinical result AKI, due to cardiogenic shock MSK chest pain from CPR  Plan:  -Continue current diuretic strategy, now net negative for admission but JVP remains elevated.  Oxygenation improving. -Continue milrinone and amiodarone IV.  SCV O2 normal, heart rate control remains marginal. -Continue current pain control strategy. -Start antacid medication.  Best practice (right click and "Reselect all SmartList Selections" daily)  Diet:  Oral advance diet per SLP  Pain/Anxiety/Delirium protocol (if indicated): No VAP protocol (if indicated): Not indicated DVT prophylaxis: Systemic AC GI prophylaxis: N/A Glucose control:  SSI Yes Central venous access:  Yes, and it is still needed Arterial  line:  Yes, and it is still needed Foley:  Yes, and it is still needed Mobility:  bed rest  PT consulted: Yes Last date of multidisciplinary goals of care  discussion [5/2: [name] and daughter [name]] Code Status:  full code Disposition: remain in ICU  Labs   CBC: Recent Labs  Lab 05/12/20 1220 05/12/20 1228 05/12/20 1602 05/13/20 0527 05/14/20 0507 05/15/20 0335 05/16/20 0319  WBC 6.2  --   --  13.1* 18.4* 14.0* 10.2  NEUTROABS 3.4  --   --   --  15.5*  --   --   HGB 13.5   < > 13.6 12.3 11.2* 11.0* 10.5*  HCT 42.8   < > 40.0 36.8 33.6* 33.5* 32.5*  MCV 79.6*  --   --  75.4* 75.8* 76.7* 77.6*  PLT 242  --   --  235 203 215 225   < > = values in this interval not displayed.    Basic Metabolic Panel: Recent Labs  Lab 05/12/20 1220 05/12/20 1228 05/12/20 1430 05/12/20 1550 05/12/20 1602 05/13/20 0527 05/14/20 0507 05/15/20 0335 05/16/20 0319  NA 135 137   < >  --  136 134* 135 132* 129*  K 3.4* 3.4*   < >  --  3.3* 3.5 3.1* 4.3 4.0  CL 100 101  --   --   --  102 100 101 92*  CO2 22  --   --   --   --  22 26 26 30   GLUCOSE 257* 253*  --   --   --  166* 99 116* 163*  BUN 15 17  --   --   --  20 23 25* 20  CREATININE 1.09* 0.90  --   --   --  1.19* 1.28* 1.27* 1.32*  CALCIUM 8.8*  --   --   --   --  8.5* 8.0* 7.8* 8.0*  MG  --   --   --  1.4*  --  2.4  --   --  1.5*  PHOS  --   --   --  4.9*  --  4.5  --   --   --    < > = values in this interval not displayed.   GFR: Estimated Creatinine Clearance: 44 mL/min (A) (by C-G formula based on SCr of 1.32 mg/dL (H)). Recent Labs  Lab 05/12/20 1550 05/12/20 2156 05/13/20 0527 05/13/20 1722 05/14/20 0507 05/14/20 0817 05/15/20 0335 05/16/20 0319  PROCALCITON  --   --   --   --   --  2.75  --   --   WBC  --   --  13.1*  --  18.4*  --  14.0* 10.2  LATICACIDVEN 3.1* 2.4*  --  1.7  --   --   --   --     Liver Function Tests: Recent Labs  Lab 05/12/20 1220 05/13/20 0527 05/14/20 0507  AST 47* 141* 109*  ALT 26 77* 87*  ALKPHOS 106 109 133*  BILITOT 1.0 0.8 0.9  PROT 6.4* 5.3* 5.1*  ALBUMIN 2.6* 2.1* 2.1*   No results for input(s): LIPASE, AMYLASE in the last  168 hours. No results for input(s): AMMONIA in the last 168 hours.  ABG    Component Value Date/Time   PHART 7.401 05/12/2020 1602   PCO2ART 37.6 05/12/2020 1602   PO2ART 349 (H) 05/12/2020 1602   HCO3 23.5 05/12/2020 1602   TCO2 25 05/12/2020 1602  ACIDBASEDEF 1.0 05/12/2020 1602   O2SAT 62.6 05/16/2020 0319     Coagulation Profile: Recent Labs  Lab 05/12/20 1220  INR 1.3*    Cardiac Enzymes: No results for input(s): CKTOTAL, CKMB, CKMBINDEX, TROPONINI in the last 168 hours.  HbA1C: HbA1c, POC (controlled diabetic range)  Date/Time Value Ref Range Status  12/30/2017 10:02 AM 9.4 (A) 0.0 - 7.0 % Final  09/06/2017 01:31 PM 8.8 (A) 0.0 - 7.0 % Final   HbA1c POC (<> result, manual entry)  Date/Time Value Ref Range Status  07/26/2019 11:08 AM >15.0 4.0 - 5.6 % Final   Hgb A1c MFr Bld  Date/Time Value Ref Range Status  05/13/2020 05:27 AM 10.5 (H) 4.8 - 5.6 % Final    Comment:    (NOTE) Pre diabetes:          5.7%-6.4%  Diabetes:              >6.4%  Glycemic control for   <7.0% adults with diabetes   02/04/2020 06:57 AM 10.4 (H) 4.8 - 5.6 % Final    Comment:    (NOTE) Pre diabetes:          5.7%-6.4%  Diabetes:              >6.4%  Glycemic control for   <7.0% adults with diabetes     CBG: Recent Labs  Lab 05/15/20 1952 05/15/20 2338 05/16/20 0347 05/16/20 0910 05/16/20 1122  GLUCAP 115* 143* 139* 175* 197*    CRITICAL CARE Performed by: Lynnell Catalan   Total critical care time: 35 minutes  Critical care time was exclusive of separately billable procedures and treating other patients.  Critical care was necessary to treat or prevent imminent or life-threatening deterioration.  Critical care was time spent personally by me on the following activities: development of treatment plan with patient and/or surrogate as well as nursing, discussions with consultants, evaluation of patient's response to treatment, examination of patient, obtaining  history from patient or surrogate, ordering and performing treatments and interventions, ordering and review of laboratory studies, ordering and review of radiographic studies, pulse oximetry, re-evaluation of patient's condition and participation in multidisciplinary rounds.  Lynnell Catalan, MD [facility] ICU Physician [facility] Critical Care  Pager: [phone] Mobile: [phone] After hours: [phone].

## 2020-05-16 NOTE — Progress Notes (Signed)
Orthopedic Tech Progress Note Patient Details:  ADDISEN CHAPPELLE 1950/02/23 [id number]  Ortho Devices Type of Ortho Device: [name] boot Ortho Device/Splint Location: BLE Ortho Device/Splint Interventions: Ordered,Application,Adjustment   Post Interventions Patient Tolerated: Well Instructions Provided: Care of device,Poper ambulation with device   Irie Dowson 05/16/2020, 12:14 PM

## 2020-05-16 NOTE — TOC Progression Note (Addendum)
Transition of Care (TOC) - Progression Note  Heart Failure  Patient Details  Name: [name] [name] MRN: [id number] Date of Birth: Jul 21, 1950  Transition of Care [facility]) CM/SW Contact  Ravynn Hogate, LCSWA Phone Number: 05/16/2020, 4:34 PM  Clinical Narrative:    CSW spoke with patient at bedside to discuss SNF bed offers and patient reported not having a preference and to get in touch with her daughters for them to decide since they know which facility would be best for her. CSW provided the patient with empathetic, active listening while the patient ventilated about her past and current life. CSW will provide the patient with outpatient mental health resources.  CSW spoke with patients daughter [name] [name] [phone] about SNF bed offers and [name] [name] reported she will discuss with her sister and brother about their options and notify the CSW on Monday which facility they have chosen.  CSW awaiting signature from attending physician on 30-day note for pending PASRR.  CSW will continue to follow throughout discharge.   Expected Discharge Plan: Skilled Nursing Facility Barriers to Discharge: Continued Medical Work up  Expected Discharge Plan and Services Expected Discharge Plan: Skilled Nursing Facility In-house Referral: Clinical Social Work Discharge Planning Services: CM Consult Post Acute Care Choice: Skilled Nursing Facility,IP Rehab (Patient and her daughter really want to go to CIR instead of SNF) Living arrangements for the past 2 months: Apartment                                       Social Determinants of Health (SDOH) Interventions Food Insecurity Interventions: Intervention Not Indicated Financial Strain Interventions: Intervention Not Indicated Housing Interventions: Intervention Not Indicated Transportation Interventions: Intervention Not Indicated,Other (Comment) (Patient reported relying on family for tranpsortation needs)  Readmission Risk  Interventions Readmission Risk Prevention Plan 02/07/2020  Post Dischage Appt Complete  Medication Screening Complete  Transportation Screening Complete  Some recent data might be hidden   Caci Orren, MSW, LCSWA [phone] Heart Failure Social Worker

## 2020-05-16 NOTE — TOC Progression Note (Signed)
Transition of Care (TOC) - Progression Note  Heart Failure   Patient Details  Name: [name] [name] MRN: [id number] Date of Birth: [birth year]  Transition of Care [facility]) CM/SW Contact  Aadhira Heffernan, LCSWA Phone Number: 05/16/2020, 9:43 AM  Clinical Narrative:    CSW is awaiting signature from MD and CSW notified the doctor of the 30-day note that needs to be signed located on the front of the patients chart, of note PASRR is closed on the weekends. CSW attempted to contact patients daughter [name] [phone] and left a voicemail for her to return the call to update her regarding CIR and SNF bed offers.   TOC will continue to follow for discharge needs.    Expected Discharge Plan: Skilled Nursing Facility Barriers to Discharge: Continued Medical Work up  Expected Discharge Plan and Services Expected Discharge Plan: Skilled Nursing Facility In-house Referral: Clinical Social Work Discharge Planning Services: CM Consult Post Acute Care Choice: Skilled Nursing Facility,IP Rehab (Patient and her daughter really want to go to CIR instead of SNF) Living arrangements for the past 2 months: Apartment                                       Social Determinants of Health (SDOH) Interventions Food Insecurity Interventions: Intervention Not Indicated Financial Strain Interventions: Intervention Not Indicated Housing Interventions: Intervention Not Indicated Transportation Interventions: Intervention Not Indicated,Other (Comment) (Patient reported relying on family for tranpsortation needs)  Readmission Risk Interventions Readmission Risk Prevention Plan 02/07/2020  Post Dischage Appt Complete  Medication Screening Complete  Transportation Screening Complete  Some recent data might be hidden   Cloyde Oregel, MSW, LCSWA [phone] Heart Failure Social Worker

## 2020-05-16 NOTE — Progress Notes (Signed)
ANTICOAGULATION CONSULT NOTE  Pharmacy Consult for Heparin Indication: atrial fibrillation  Allergies  Allergen Reactions  . Amiodarone Shortness Of Breath and Other (See Comments)    Soreness, wheezing, and weakness also  . Fruit & Vegetable Daily [Nutritional Supplements] Shortness Of Breath, Swelling and Other (See Comments)    All fruits cause tongue swelling and numbness Organic fruits are okay to eat  . Metoprolol Shortness Of Breath, Other (See Comments) and Cough    Soreness, wheezing, and weakness also  . Other Shortness Of Breath, Swelling and Other (See Comments)    Tree nuts cause tongue swelling and numbness  . Peanut-Containing Drug Products Shortness Of Breath, Swelling and Other (See Comments)    Tongue swelling and numbness  . Aspirin Nausea And Vomiting and Other (See Comments)    Cannot take due to liver issues  . Tylenol [Acetaminophen] Other (See Comments)    Cannot take due to liver issues- tolerating 650 mg tablets in 2022  . Beef-Derived Products Other (See Comments)    Limited, per patient  . Lactose Intolerance (Gi) Diarrhea and Nausea And Vomiting    Patient Measurements: Weight: 92.5 kg (203 lb 14.8 oz) Heparin Dosing Weight: 67 kg  Vital Signs: Temp: 97.9 F (36.6 C) (05/06 0350) Temp Source: Oral (05/06 0350) BP: 161/78 (05/06 0700) Pulse Rate: 111 (05/06 0700)  Labs: Recent Labs    05/14/20 0507 05/14/20 1246 05/14/20 2257 05/15/20 0335 05/15/20 0800 05/16/20 0319  HGB 11.2*  --   --  11.0*  --  10.5*  HCT 33.6*  --   --  33.5*  --  32.5*  PLT 203  --   --  215  --  225  APTT 92*  --   --   --   --   --   HEPARINUNFRC  --    < > 0.13*  --  0.30 0.48  CREATININE 1.28*  --   --  1.27*  --  1.32*   < > = values in this interval not displayed.    Estimated Creatinine Clearance: 44 mL/min (A) (by C-G formula based on SCr of 1.32 mg/dL (H)).   Medical History: Past Medical History:  Diagnosis Date  . Abdominal pain   . Atrial  flutter (HCC)   . Back pain   . Chronic combined systolic and diastolic CHF (congestive heart failure) (HCC) 02/04/2020  . Constipation   . Diabetes mellitus without complication (HCC)   . Fatigue   . Full dentures   . Hemorrhoids   . History of noncompliance with medical treatment, presenting hazards to health   . Hypertension   . Lack of adequate sleep   . Liver mass   . PAF (paroxysmal atrial fibrillation) (HCC)   . Rash    on right breast  . Wears glasses     Assessment: 50 yoF admitted with acute CVA s/p tPA 5/2. Pt on apixaban with hx AFib with poor compliance. Heparin started with no bolus and low goal.  Heparin level is therapeutic at 0.48, on 1250 units/hr. Hgb drifting 14.3>10.5, plt WNL. No s/sx of bleeding or infusion issues.   Goal of Therapy:  Heparin level 0.3 - 0.5 units/ml APTT 66 - 85 secs Monitor platelets by anticoagulation protocol: Yes   Plan:  Continue heparin to 1250 units/hr Daily heparin level and CBC F/u anemia panel given Hgb drift   Sherron Monday, PharmD, BCCCP Clinical Pharmacist  Phone: [phone] 05/16/2020 7:14 AM  Please check AMION for all  [facility] Pharmacy phone numbers After 10:00 PM, call [place] [phone]

## 2020-05-16 NOTE — Consult Note (Signed)
   [facility] CM Inpatient Consult   05/16/2020  DETRIA CUMMINGS 1950-06-22 [id number]   [facility] [[facility]]  Accountable Care Organization [ACO] Patient: [facility]  Patient has access  in [employer] [facility] team for Chronic Care Management support. Patient will have the transition of care call conducted by the primary care provider. Patient currently in ICU level of care. Briefly reviewed for PT/OT recommendations.  Plan: Following for progression and disposition needs for transition of care, if appropriate with disposition needs.   Please contact for further questions,  Charlesetta Shanks, RN BSN CCM [facility]  [phone] business mobile phone Toll free office [phone]  Fax number: [phone] [email or web address]

## 2020-05-16 NOTE — Progress Notes (Signed)
Occupational Therapy Treatment Patient Details Name: [name] [name] MRN: [id number] DOB: Oct 02, 1950 Today's Date: 05/16/2020    History of present illness 70 yo female admitted 05/12/20 with L side weakness s/p TPA with mechanical thrombectomy followed by cardiac arrest receiving CPR for <5 minutes. CTA R MCA occlusion with successful revascualarization intubated 5/2 extubated 5/3. PMHx: DM chronic combined HF, HTN, afib, 01/2020 COVID   OT comments  Pt supine in bed, asleep but easily aroused.  Reports fatigued from not sleeping well last night but agreeable to OT session.  Completing bed mobility with min assist, engaged in EOB BUE exercise and cognitive assessment.  Short blessed test reveals deficits in attention, sequencing and memory, with pt scoring 5/28 (questionable deficits) missing 1 number when counting backwards from 20 and 2 parts of the name/address recall.  Patient reports dizziness with any movement, BP remains stable.  No pain today.  HR ranged from 110s-120s with minimal activity.  Will follow acutely.    Follow Up Recommendations  SNF (pending progress)    Equipment Recommendations  3 in 1 bedside commode    Recommendations for Other Services      Precautions / Restrictions Precautions Precautions: Fall Precaution Comments: watch sats/HR Restrictions Weight Bearing Restrictions: No       Mobility Bed Mobility Overal bed mobility: Needs Assistance Bed Mobility: Supine to Sit;Sit to Supine     Supine to sit: Min assist Sit to supine: Min assist   General bed mobility comments: min assist for trunk support to and from EOB with cueing for sequencing    Transfers                 General transfer comment: deferred d/t fatigue and requesting to rest    Balance Overall balance assessment: Needs assistance;Mild deficits observed, not formally tested   Sitting balance-Leahy Scale: Fair                                     ADL either  performed or assessed with clinical judgement   ADL Overall ADL's : Needs assistance/impaired     Grooming: Set up;Sitting                               Functional mobility during ADLs: Minimal assistance General ADL Comments: limited by fatigue, dizziness     Vision       Perception     Praxis      Cognition Arousal/Alertness: Awake/alert Behavior During Therapy: Flat affect Overall Cognitive Status: Impaired/Different from baseline Area of Impairment: Memory;Problem solving                     Memory: Decreased short-term memory       Problem Solving: Difficulty sequencing General Comments: short blessed test reveals deficits in recall, mild inattention/sequencing (scoring 5/28, with deficits with memory and missing 1 # in counting backwards sequence)        Exercises Exercises: General Upper Extremity General Exercises - Upper Extremity Shoulder Flexion: AROM;Both;10 reps;Seated (2 sets)   Shoulder Instructions       General Comments SpO2 stable on 1.5L [place], HR ranged from 110s-120s    Pertinent Vitals/ Pain       Pain Assessment: No/denies pain Pain Location: no pain nausea  Home Living  Prior Functioning/Environment              Frequency  Min 2X/week        Progress Toward Goals  OT Goals(current goals can now be found in the care plan section)  Progress towards OT goals: Progressing toward goals  Acute Rehab OT Goals Patient Stated Goal: get back to independent OT Goal Formulation: With patient  Plan Discharge plan remains appropriate;Frequency remains appropriate    Co-evaluation                 AM-PAC OT "6 Clicks" Daily Activity     Outcome Measure   Help from another person eating meals?: None Help from another person taking care of personal grooming?: A Little Help from another person toileting, which includes using toliet, bedpan, or  urinal?: A Little Help from another person bathing (including washing, rinsing, drying)?: A Lot Help from another person to put on and taking off regular upper body clothing?: A Little Help from another person to put on and taking off regular lower body clothing?: A Lot 6 Click Score: 17    End of Session Equipment Utilized During Treatment: Oxygen  OT Visit Diagnosis: Unsteadiness on feet (R26.81);Other abnormalities of gait and mobility (R26.89);Pain;Muscle weakness (generalized) (M62.81);Other symptoms and signs involving cognitive function   Activity Tolerance Patient limited by fatigue   Patient Left in bed;with call bell/phone within reach;with bed alarm set   Nurse Communication  (fatigue, dizziness)        Time: 2951-8841 OT Time Calculation (min): 23 min  Charges: OT General Charges $OT Visit: 1 Visit OT Treatments $Self Care/Home Management : 8-22 mins $Therapeutic Exercise: 8-22 mins  Barry Brunner, OT Acute Rehabilitation Services Pager [phone] Office [phone]    Chancy Milroy 05/16/2020, 3:49 PM

## 2020-05-17 DIAGNOSIS — I6300 Cerebral infarction due to thrombosis of unspecified precerebral artery: Secondary | ICD-10-CM

## 2020-05-17 DIAGNOSIS — I639 Cerebral infarction, unspecified: Secondary | ICD-10-CM | POA: Diagnosis not present

## 2020-05-17 LAB — BASIC METABOLIC PANEL
Anion gap: 9 (ref 5–15)
BUN: 15 mg/dL (ref 8–23)
CO2: 39 mmol/L — ABNORMAL HIGH (ref 22–32)
Calcium: 8.9 mg/dL (ref 8.9–10.3)
Chloride: 80 mmol/L — ABNORMAL LOW (ref 98–111)
Creatinine, Ser: 1.23 mg/dL — ABNORMAL HIGH (ref 0.44–1.00)
GFR, Estimated: 48 mL/min — ABNORMAL LOW (ref >60)
Glucose, Bld: 100 mg/dL — ABNORMAL HIGH (ref 70–99)
Potassium: 3.5 mmol/L (ref 3.5–5.1)
Sodium: 128 mmol/L — ABNORMAL LOW (ref 135–145)

## 2020-05-17 LAB — CBC
HCT: 34.4 % — ABNORMAL LOW (ref 36.0–46.0)
Hemoglobin: 11.3 g/dL — ABNORMAL LOW (ref 12.0–15.0)
MCH: 25 pg — ABNORMAL LOW (ref 26.0–34.0)
MCHC: 32.8 g/dL (ref 30.0–36.0)
MCV: 76.1 fL — ABNORMAL LOW (ref 80.0–100.0)
Platelets: 229 10*3/uL (ref 150–400)
RBC: 4.52 MIL/uL (ref 3.87–5.11)
RDW: 14.8 % (ref 11.5–15.5)
WBC: 8.6 10*3/uL (ref 4.0–10.5)
nRBC: 0 % (ref 0.0–0.2)

## 2020-05-17 LAB — GLUCOSE, CAPILLARY
Glucose-Capillary: 101 mg/dL — ABNORMAL HIGH (ref 70–99)
Glucose-Capillary: 105 mg/dL — ABNORMAL HIGH (ref 70–99)
Glucose-Capillary: 135 mg/dL — ABNORMAL HIGH (ref 70–99)
Glucose-Capillary: 142 mg/dL — ABNORMAL HIGH (ref 70–99)
Glucose-Capillary: 167 mg/dL — ABNORMAL HIGH (ref 70–99)
Glucose-Capillary: 168 mg/dL — ABNORMAL HIGH (ref 70–99)
Glucose-Capillary: 173 mg/dL — ABNORMAL HIGH (ref 70–99)
Glucose-Capillary: 81 mg/dL (ref 70–99)

## 2020-05-17 LAB — HEPARIN LEVEL (UNFRACTIONATED)
Heparin Unfractionated: 0.81 IU/mL — ABNORMAL HIGH (ref 0.30–0.70)
Heparin Unfractionated: 0.52 IU/mL (ref 0.30–0.70)

## 2020-05-17 LAB — MAGNESIUM: Magnesium: 1.8 mg/dL (ref 1.7–2.4)

## 2020-05-17 LAB — COOXEMETRY PANEL
Carboxyhemoglobin: 1.6 % — ABNORMAL HIGH (ref 0.5–1.5)
Methemoglobin: 1 % (ref 0.0–1.5)
O2 Saturation: 71.7 %
Total hemoglobin: 11.7 g/dL — ABNORMAL LOW (ref 12.0–16.0)

## 2020-05-17 MED ORDER — MAGNESIUM SULFATE 2 GM/50ML IV SOLN
2.0000 g | Freq: Once | INTRAVENOUS | Status: AC
  Administered 2020-05-17: 2 g via INTRAVENOUS
  Filled 2020-05-17: qty 50

## 2020-05-17 MED ORDER — POTASSIUM CHLORIDE 20 MEQ PO PACK
40.0000 meq | PACK | Freq: Once | ORAL | Status: AC
  Administered 2020-05-17: 40 meq via ORAL
  Filled 2020-05-17: qty 2

## 2020-05-17 MED ORDER — SPIRONOLACTONE 25 MG PO TABS
25.0000 mg | ORAL_TABLET | Freq: Every day | ORAL | Status: DC
  Administered 2020-05-17 – 2020-05-22 (×6): 25 mg via ORAL
  Filled 2020-05-17 (×6): qty 1

## 2020-05-17 MED ORDER — ONDANSETRON HCL 4 MG/2ML IJ SOLN
4.0000 mg | Freq: Three times a day (TID) | INTRAMUSCULAR | Status: DC | PRN
  Administered 2020-05-17 – 2020-05-20 (×5): 4 mg via INTRAVENOUS
  Filled 2020-05-17 (×5): qty 2

## 2020-05-17 NOTE — Progress Notes (Addendum)
ANTICOAGULATION CONSULT NOTE  Pharmacy Consult for Heparin Indication: atrial fibrillation  Allergies  Allergen Reactions  . Amiodarone Shortness Of Breath and Other (See Comments)    Soreness, wheezing, and weakness also  . Fruit & Vegetable Daily [Nutritional Supplements] Shortness Of Breath, Swelling and Other (See Comments)    All fruits cause tongue swelling and numbness Organic fruits are okay to eat  . Metoprolol Shortness Of Breath, Other (See Comments) and Cough    Soreness, wheezing, and weakness also  . Other Shortness Of Breath, Swelling and Other (See Comments)    Tree nuts cause tongue swelling and numbness  . Peanut-Containing Drug Products Shortness Of Breath, Swelling and Other (See Comments)    Tongue swelling and numbness  . Aspirin Nausea And Vomiting and Other (See Comments)    Cannot take due to liver issues  . Tylenol [Acetaminophen] Other (See Comments)    Cannot take due to liver issues- tolerating 650 mg tablets in 2022  . Beef-Derived Products Other (See Comments)    Limited, per patient  . Lactose Intolerance (Gi) Diarrhea and Nausea And Vomiting    Patient Measurements: Weight: 92.5 kg (203 lb 14.8 oz) Heparin Dosing Weight: 67 kg  Vital Signs: Temp: 98 F (36.7 C) (05/07 1158) Temp Source: Oral (05/07 0347) BP: 137/81 (05/07 0800) Pulse Rate: 130 (05/07 1145)  Labs: Recent Labs    05/15/20 0335 05/15/20 0800 05/16/20 0319 05/17/20 0301 05/17/20 1410  HGB 11.0*  --  10.5* 11.3*  --   HCT 33.5*  --  32.5* 34.4*  --   PLT 215  --  225 229  --   HEPARINUNFRC  --    < > 0.48 0.81* 0.52  CREATININE 1.27*  --  1.32* 1.23*  --    < > = values in this interval not displayed.    Estimated Creatinine Clearance: 47.3 mL/min (A) (by C-G formula based on SCr of 1.23 mg/dL (H)).   Medical History: Past Medical History:  Diagnosis Date  . Abdominal pain   . Atrial flutter (HCC)   . Back pain   . Chronic combined systolic and diastolic CHF  (congestive heart failure) (HCC) 02/04/2020  . Constipation   . Diabetes mellitus without complication (HCC)   . Fatigue   . Full dentures   . Hemorrhoids   . History of noncompliance with medical treatment, presenting hazards to health   . Hypertension   . Lack of adequate sleep   . Liver mass   . PAF (paroxysmal atrial fibrillation) (HCC)   . Rash    on right breast  . Wears glasses     Assessment: 33 yoF admitted with acute CVA s/p tPA 5/2. Pt on apixaban with hx AFib with poor compliance. Heparin started with no bolus and low goal.  Heparin level is slightly supratherapeutic at 0.52, on 1100 units/hr. Hgb drifting 11.3, plt 229. No s/sx of bleeding or infusion issues.   Goal of Therapy:  Heparin level 0.3 - 0.5 units/ml APTT 66 - 85 secs Monitor platelets by anticoagulation protocol: Yes   Plan:  Reduce heparin infusion slightly to 1050 units/hr Monitor daily HL, CBC, and for s/sx of bleeding  Sherron Monday, PharmD, BCCCP Clinical Pharmacist  Phone: [phone] 05/17/2020 3:29 PM  Please check AMION for all [facility] Pharmacy phone numbers After 10:00 PM, call Main Pharmacy [phone]

## 2020-05-17 NOTE — Progress Notes (Signed)
NAME:  [name] [name], MRN:  [id number], DOB:  26-Jun-1950, LOS: 5 ADMISSION DATE:  05/12/2020, CONSULTATION DATE:  5/2 REFERRING MD:  Corliss Skains, CHIEF COMPLAINT:  Left sided weakness, facial droop   History of Present Illness:  70 y/o female with multiple medical problems presented with left sided weakness and required TPA and mechanical thrombectomy.  Had a cardiac arrest immediately following her thrombectomy with CPR for < 5 minutes.    Pertinent  Medical History  Atrial fibrillation Medication non-compliance Systolic heart failure, LVEF 25-30% Hypertension Tricuspid regurgitation COVID 19 Hyperlipidemia  Significant Hospital Events: Including procedures, antibiotic start and stop dates in addition to other pertinent events   5/2 - Patient admitted to hospital Code Stroke with left sided weakness/facial droop. CTA shows vessel occlusion of the R ICA terminus, proximal M1 R MCA and proximal A1 R ACA with mild atherosclerotic disease within the carotid bifurcations and proximal ICAs.  Cleviprex and Labetolol started for Afib with RVR and HTN. Patient sent to IR for thrombectomy and was intubated for procedure. IR had succesful mechanical thrombectomy of the clot with complete vascularization.  After procedure, patient became hypotensive and went into a PEA arrest. Chest compressions were started and patient receive a total of 2 epi. Patient stabilized. Central line placed and patient placed on pressors (Vaso, Epi, Neo). Patient transported to 4N ICU. Came off vasopressors, was noted to be moving all four extremities after arrival to ICU 5/3 - Extubated, seen by HF   Interim History / Subjective:  Continues to complain of nausea.  Believes it is the amiodarone.  Objective   Blood pressure 137/81, pulse (!) 130, temperature 98.2 F (36.8 C), resp. rate 19, weight 92.5 kg, SpO2 95 %.    FiO2 (%):  [40 %] 40 %   Intake/Output Summary (Last 24 hours) at 05/17/2020 1642 Last data filed at  05/17/2020 1200 Gross per 24 hour  Intake 1052.2 ml  Output 6440 ml  Net -5387.8 ml   Filed Weights   05/14/20 0500 05/15/20 0500 05/16/20 0500  Weight: 92.9 kg 92.8 kg 92.5 kg    Examination:  General: Lying in bed HENT: no icterus, Oral mucosa moist.  PULM :chest clear, now on 1-1/2 L by nasal cannula.. Tenderness to palpation of right back has improved CV: HS normal. Still tachycardic.  JVP now 3 cm above sternal angle.  Improving peripheral edema with Unna boots on.  CVP measures at 11 mmHg GI: BS+, soft, nontender MSK: normal bulk and tone Neuro: awake, alert, following commands, moving all four extremities. RUE 4/5 strength.   Labs/imaging that I havepersonally reviewed  (right click and "Reselect all SmartList Selections" daily)  Hypokalemia.   Resolved Hospital Problem list     Assessment & Plan:   Acute hypoxic respiratory insufficiency due to pulmonary edema-improving. Remains critically ill cardiogenic shock, acute systolic heart failure, adequate UOP overnight Atrial fibrillation Non-compliance with home medicines Acute ischemic stroke to R common carotid artery, embolic, s/p TPA and mechanical thrombectomy with apparent good clinical result AKI, due to cardiogenic shock MSK chest pain from CPR  Plan:  -Markedly negative fluid balance since admission.  We will decrease IV furosemide rate. -Still tachycardic despite amiodarone and digoxin. -Given excellent SCV O2, will decrease milrinone to 0.125 which will help with tachycardia.  -Decrease amiodarone today.,  Consider transition to oral tomorrow.  Consider restarting home beta-blocker tomorrow.  Best practice (right click and "Reselect all SmartList Selections" daily)  Diet:  Oral advance diet per SLP  Pain/Anxiety/Delirium protocol (if indicated): No VAP protocol (if indicated): Not indicated DVT prophylaxis: Systemic AC GI prophylaxis: N/A Glucose control:  SSI Yes Central venous access:  Yes, and it  is still needed Arterial line:  Yes, and it is still needed Foley:  Yes, and it is still needed Mobility:  bed rest  PT consulted: Yes Last date of multidisciplinary goals of care discussion [5/2: [name] and daughter [name]] Code Status:  full code Disposition: remain in ICU  Labs   CBC: Recent Labs  Lab 05/12/20 1220 05/12/20 1228 05/13/20 0527 05/14/20 0507 05/15/20 0335 05/16/20 0319 05/17/20 0301  WBC 6.2  --  13.1* 18.4* 14.0* 10.2 8.6  NEUTROABS 3.4  --   --  15.5*  --   --   --   HGB 13.5   < > 12.3 11.2* 11.0* 10.5* 11.3*  HCT 42.8   < > 36.8 33.6* 33.5* 32.5* 34.4*  MCV 79.6*  --  75.4* 75.8* 76.7* 77.6* 76.1*  PLT 242  --  235 203 215 225 229   < > = values in this interval not displayed.    Basic Metabolic Panel: Recent Labs  Lab 05/12/20 1550 05/12/20 1602 05/13/20 0527 05/14/20 0507 05/15/20 0335 05/16/20 0319 05/17/20 0301  NA  --    < > 134* 135 132* 129* 128*  K  --    < > 3.5 3.1* 4.3 4.0 3.5  CL  --   --  102 100 101 92* 80*  CO2  --   --  22 26 26 30  39*  GLUCOSE  --   --  166* 99 116* 163* 100*  BUN  --   --  20 23 25* 20 15  CREATININE  --   --  1.19* 1.28* 1.27* 1.32* 1.23*  CALCIUM  --   --  8.5* 8.0* 7.8* 8.0* 8.9  MG 1.4*  --  2.4  --   --  1.5* 1.8  PHOS 4.9*  --  4.5  --   --   --   --    < > = values in this interval not displayed.   GFR: Estimated Creatinine Clearance: 47.3 mL/min (A) (by C-G formula based on SCr of 1.23 mg/dL (H)). Recent Labs  Lab 05/12/20 1550 05/12/20 2156 05/13/20 0527 05/13/20 1722 05/14/20 0507 05/14/20 0817 05/15/20 0335 05/16/20 0319 05/17/20 0301  PROCALCITON  --   --   --   --   --  2.75  --   --   --   WBC  --   --    < >  --  18.4*  --  14.0* 10.2 8.6  LATICACIDVEN 3.1* 2.4*  --  1.7  --   --   --   --   --    < > = values in this interval not displayed.    Liver Function Tests: Recent Labs  Lab 05/12/20 1220 05/13/20 0527 05/14/20 0507  AST 47* 141* 109*  ALT 26 77* 87*  ALKPHOS  106 109 133*  BILITOT 1.0 0.8 0.9  PROT 6.4* 5.3* 5.1*  ALBUMIN 2.6* 2.1* 2.1*   No results for input(s): LIPASE, AMYLASE in the last 168 hours. No results for input(s): AMMONIA in the last 168 hours.  ABG    Component Value Date/Time   PHART 7.401 05/12/2020 1602   PCO2ART 37.6 05/12/2020 1602   PO2ART 349 (H) 05/12/2020 1602   HCO3 23.5 05/12/2020 1602   TCO2 25 05/12/2020 1602  ACIDBASEDEF 1.0 05/12/2020 1602   O2SAT 71.7 05/17/2020 0301     Coagulation Profile: Recent Labs  Lab 05/12/20 1220  INR 1.3*    Cardiac Enzymes: No results for input(s): CKTOTAL, CKMB, CKMBINDEX, TROPONINI in the last 168 hours.  HbA1C: HbA1c, POC (controlled diabetic range)  Date/Time Value Ref Range Status  12/30/2017 10:02 AM 9.4 (A) 0.0 - 7.0 % Final  09/06/2017 01:31 PM 8.8 (A) 0.0 - 7.0 % Final   HbA1c POC (<> result, manual entry)  Date/Time Value Ref Range Status  07/26/2019 11:08 AM >15.0 4.0 - 5.6 % Final   Hgb A1c MFr Bld  Date/Time Value Ref Range Status  05/13/2020 05:27 AM 10.5 (H) 4.8 - 5.6 % Final    Comment:    (NOTE) Pre diabetes:          5.7%-6.4%  Diabetes:              >6.4%  Glycemic control for   <7.0% adults with diabetes   02/04/2020 06:57 AM 10.4 (H) 4.8 - 5.6 % Final    Comment:    (NOTE) Pre diabetes:          5.7%-6.4%  Diabetes:              >6.4%  Glycemic control for   <7.0% adults with diabetes     CBG: Recent Labs  Lab 05/17/20 0343 05/17/20 0804 05/17/20 1121 05/17/20 1415 05/17/20 1551  GLUCAP 81 101* 135* 168* 173*    CRITICAL CARE Performed by: Lynnell Catalan   Total critical care time: 35 minutes  Critical care time was exclusive of separately billable procedures and treating other patients.  Critical care was necessary to treat or prevent imminent or life-threatening deterioration.  Critical care was time spent personally by me on the following activities: development of treatment plan with patient and/or  surrogate as well as nursing, discussions with consultants, evaluation of patient's response to treatment, examination of patient, obtaining history from patient or surrogate, ordering and performing treatments and interventions, ordering and review of laboratory studies, ordering and review of radiographic studies, pulse oximetry, re-evaluation of patient's condition and participation in multidisciplinary rounds.  Lynnell Catalan, MD [facility] ICU Physician [facility]  Pager: [phone] Mobile: [phone] After hours: [phone].

## 2020-05-17 NOTE — Progress Notes (Signed)
ANTICOAGULATION CONSULT NOTE  Pharmacy Consult for Heparin Indication: atrial fibrillation  Allergies  Allergen Reactions  . Amiodarone Shortness Of Breath and Other (See Comments)    Soreness, wheezing, and weakness also  . Fruit & Vegetable Daily [Nutritional Supplements] Shortness Of Breath, Swelling and Other (See Comments)    All fruits cause tongue swelling and numbness Organic fruits are okay to eat  . Metoprolol Shortness Of Breath, Other (See Comments) and Cough    Soreness, wheezing, and weakness also  . Other Shortness Of Breath, Swelling and Other (See Comments)    Tree nuts cause tongue swelling and numbness  . Peanut-Containing Drug Products Shortness Of Breath, Swelling and Other (See Comments)    Tongue swelling and numbness  . Aspirin Nausea And Vomiting and Other (See Comments)    Cannot take due to liver issues  . Tylenol [Acetaminophen] Other (See Comments)    Cannot take due to liver issues- tolerating 650 mg tablets in 2022  . Beef-Derived Products Other (See Comments)    Limited, per patient  . Lactose Intolerance (Gi) Diarrhea and Nausea And Vomiting    Patient Measurements: Weight: 92.5 kg (203 lb 14.8 oz) Heparin Dosing Weight: 67 kg  Vital Signs: Temp: 98 F (36.7 C) (05/07 0347) Temp Source: Oral (05/07 0347) BP: 127/67 (05/07 0300) Pulse Rate: 113 (05/07 0300)  Labs: Recent Labs    05/14/20 0507 05/14/20 1246 05/15/20 0335 05/15/20 0800 05/16/20 0319 05/17/20 0301  HGB 11.2*  --  11.0*  --  10.5* 11.3*  HCT 33.6*  --  33.5*  --  32.5* 34.4*  PLT 203  --  215  --  225 229  APTT 92*  --   --   --   --   --   HEPARINUNFRC  --    < >  --  0.30 0.48 0.81*  CREATININE 1.28*  --  1.27*  --  1.32*  --    < > = values in this interval not displayed.    Estimated Creatinine Clearance: 44 mL/min (A) (by C-G formula based on SCr of 1.32 mg/dL (H)).   Medical History: Past Medical History:  Diagnosis Date  . Abdominal pain   . Atrial  flutter (HCC)   . Back pain   . Chronic combined systolic and diastolic CHF (congestive heart failure) (HCC) 02/04/2020  . Constipation   . Diabetes mellitus without complication (HCC)   . Fatigue   . Full dentures   . Hemorrhoids   . History of noncompliance with medical treatment, presenting hazards to health   . Hypertension   . Lack of adequate sleep   . Liver mass   . PAF (paroxysmal atrial fibrillation) (HCC)   . Rash    on right breast  . Wears glasses     Assessment: 71 yoF admitted with acute CVA s/p tPA 5/2. Pt on apixaban with hx AFib with poor compliance. Heparin started with no bolus and low goal.  Heparin level is therapeutic at 0.48, on 1250 units/hr. Hgb drifting 14.3>10.5, plt WNL. No s/sx of bleeding or infusion issues.   5/7 AM update:  Heparin level elevated  Hgb up some  Goal of Therapy:  Heparin level 0.3 - 0.5 units/ml APTT 66 - 85 secs Monitor platelets by anticoagulation protocol: Yes   Plan:  Dec heparin to 1100 units/hr 1200 heparin level  Abran Duke, PharmD, BCPS Clinical Pharmacist Phone: [phone]

## 2020-05-17 NOTE — Progress Notes (Addendum)
Advanced Heart Failure Rounding Note  PCP-Cardiologist: Kardie Tobb, DO   Subjective:    5/5 Milrinone increased to 0.25 mcg. Off norepi. Diuresed with lasix drip 15 mg + metolazone.   Was on milrinone 0.25 mcg. CO-OX 72%. CCM turned to 0.125  Diuresing with lasix drip at 15 mg per hour. Negative 6.5 liters. CCM turned down to 8  Creatinine 1.3 -> 1.2  Complains of nausea. Feels it is amio. Drip turned down by CCM    Objective:   Weight Range: 92.5 kg Body mass index is 36.12 kg/m.   Vital Signs:   Temp:  [97.8 F (36.6 C)-98 F (36.7 C)] 97.9 F (36.6 C) (05/07 0809) Pulse Rate:  [109-119] 115 (05/07 0801) Resp:  [11-22] 19 (05/07 0801) BP: (110-161)/(63-97) 137/81 (05/07 0800) SpO2:  [88 %-100 %] 95 % (05/07 0801) FiO2 (%):  [40 %] 40 % (05/07 0800) Last BM Date:  (PTA)  Weight change: Filed Weights   05/14/20 0500 05/15/20 0500 05/16/20 0500  Weight: 92.9 kg 92.8 kg 92.5 kg    Intake/Output:   Intake/Output Summary (Last 24 hours) at 05/17/2020 1112 Last data filed at 05/17/2020 0700 Gross per 24 hour  Intake 1648.93 ml  Output 7790 ml  Net -6141.07 ml      Physical Exam   CVP 11 General:  Weak appearing. No resp difficulty HEENT: normal Neck: supple.JVP to jaw Carotids 2+ bilat; no bruits. No lymphadenopathy or thryomegaly appreciated. Cor: PMI nondisplaced. Regular tachy Lungs: clear Abdomen: soft, nontender, nondistended. No hepatosplenomegaly. No bruits or masses. Good bowel sounds. Extremities: no cyanosis, clubbing, rash, 1+  edema Neuro: alert & orientedx3, cranial nerves grossly intact. moves all 4 extremities w/o difficulty. Affect pleasant   Telemetry   Atrial tach 110-120. Personally reviewed  Labs    CBC Recent Labs    05/16/20 0319 05/17/20 0301  WBC 10.2 8.6  HGB 10.5* 11.3*  HCT 32.5* 34.4*  MCV 77.6* 76.1*  PLT 225 229   Basic Metabolic Panel Recent Labs    56/43/32 0319 05/17/20 0301  NA 129* 128*  K 4.0 3.5   CL 92* 80*  CO2 30 39*  GLUCOSE 163* 100*  BUN 20 15  CREATININE 1.32* 1.23*  CALCIUM 8.0* 8.9  MG 1.5* 1.8   Liver Function Tests No results for input(s): AST, ALT, ALKPHOS, BILITOT, PROT, ALBUMIN in the last 72 hours. No results for input(s): LIPASE, AMYLASE in the last 72 hours. Cardiac Enzymes No results for input(s): CKTOTAL, CKMB, CKMBINDEX, TROPONINI in the last 72 hours.  BNP: BNP (last 3 results) Recent Labs    03/03/20 1559  BNP 1,981.9*    ProBNP (last 3 results) No results for input(s): PROBNP in the last 8760 hours.   D-Dimer No results for input(s): DDIMER in the last 72 hours. Hemoglobin A1C No results for input(s): HGBA1C in the last 72 hours. Fasting Lipid Panel No results for input(s): CHOL, HDL, LDLCALC, TRIG, CHOLHDL, LDLDIRECT in the last 72 hours. Thyroid Function Tests No results for input(s): TSH, T4TOTAL, T3FREE, THYROIDAB in the last 72 hours.  Invalid input(s): FREET3  Other results:   Imaging    No results found.   Medications:     Scheduled Medications: . atorvastatin  80 mg Oral Daily  . Chlorhexidine Gluconate Cloth  6 each Topical Daily  . digoxin  0.125 mg Oral Daily  . famotidine  20 mg Oral Q2000  . feeding supplement  237 mL Oral BID BM  .  insulin aspart  0-15 Units Subcutaneous Q4H  . insulin glargine  18 Units Subcutaneous QHS  . lidocaine  1 patch Transdermal Q24H  . pantoprazole  40 mg Oral Daily  . potassium chloride  40 mEq Oral Daily    Infusions: . sodium chloride Stopped (05/16/20 0600)  . amiodarone 30 mg/hr (05/17/20 1112)  . furosemide (LASIX) 200 mg in dextrose 5% 100 mL (2mg /mL) infusion 15 mg/hr (05/17/20 0700)  . heparin 1,100 Units/hr (05/17/20 0700)  . milrinone 0.25 mcg/kg/min (05/17/20 0921)    PRN Medications: sodium chloride, oxyCODONE, senna-docusate, simethicone    Assessment/Plan   1. Acute on chronic systolic HF ->  Cardiogenic shock - EF < 15% with severe biventricular  failure - almost certainly due to tachy-induced (AF) cardiomyopathy - amio for rate/rhythm control. Potential TEE/DC-CV as needed - CO-OX 72% on milrinone 0.25 mcg.  CCM decreased to 0.125 - Volume status improving but weight only down 1 pound. Resume lasix 15 mg per hour - Renal function stable.  - Continue unna boots.  - continue dig, add spiro 25  2. AF with RVR/atrial tach - amio for rate/rhythm control.  - Continue amio drip will drop rate to 30 due to nausea - continue heparin gtt . Has blood in sputum. Hgb drifting down . Watch closely.  - will need TEE/DC-CV on Monday. Will make NPO tomorrow night  3. Acute embolic CVA  - s/p successful revascularization with systemic T-PA and mechanical thrombectomy - no clinical residual and no significant defect on MRI - c/w heparin gtt  - Continue PT/OT  4. Non-compliance  5. Bipolar d/o - will need outpatient f/u w/ mental health   6. Poorly controlled DM2 - hgb A1c 10.5  - insulin per primary team   7. PEA arrest - Had brief CPR and intubated. - On 2 liters [place].   8. Moderate Pericardial Effusion w/o Tamponade  - c/w IV diuretics  - monitor hemodynamics   9. Hypokalemia - will supp  - add spiro 25  10. Leukocytosis  - WBC 13>>18>>14K> 10 but AF. ? Reactive  - CXR 5/3 w/ diffuse infiltrates/ edema - PCT elevated 2.75 but in setting of renal insuffiencey  - UA negative   11. Iron-deficient Anemia - Hgb stable 10.5 -> 11.3 - got feraheme 5/6   Length of Stay: 5  7/3, MD  05/17/2020, 11:12 AM  Advanced Heart Failure Team Pager [phone] (M-F; 7a - 5p)  Please contact [facility] for night-coverage after hours (5p -7a ) and weekends on [email or web address]

## 2020-05-17 NOTE — Progress Notes (Signed)
Mg 1.8 Replaced per protocol  

## 2020-05-17 NOTE — Progress Notes (Signed)
eLink Physician-Brief Progress Note Patient Name: [name] [name] DOB: Oct 02, 1950 MRN: [id number]   Date of Service  05/17/2020  HPI/Events of Note  Hypokalemia - K+ = 3.5 and Creatinine = 1.23. Patient is on a Lasix IV infusion.  eICU Interventions  Will replace K+.     Intervention Category Major Interventions: Electrolyte abnormality - evaluation and management  Jaykob Minichiello [place] 05/17/2020, 4:28 AM

## 2020-05-18 DIAGNOSIS — I639 Cerebral infarction, unspecified: Secondary | ICD-10-CM | POA: Diagnosis not present

## 2020-05-18 LAB — GLUCOSE, CAPILLARY
Glucose-Capillary: 114 mg/dL — ABNORMAL HIGH (ref 70–99)
Glucose-Capillary: 120 mg/dL — ABNORMAL HIGH (ref 70–99)
Glucose-Capillary: 130 mg/dL — ABNORMAL HIGH (ref 70–99)
Glucose-Capillary: 137 mg/dL — ABNORMAL HIGH (ref 70–99)
Glucose-Capillary: 232 mg/dL — ABNORMAL HIGH (ref 70–99)

## 2020-05-18 LAB — COOXEMETRY PANEL
Carboxyhemoglobin: 1.6 % — ABNORMAL HIGH (ref 0.5–1.5)
Methemoglobin: 1 % (ref 0.0–1.5)
O2 Saturation: 58.8 %
Total hemoglobin: 12.3 g/dL (ref 12.0–16.0)

## 2020-05-18 LAB — CBC
HCT: 36.5 % (ref 36.0–46.0)
Hemoglobin: 12.1 g/dL (ref 12.0–15.0)
MCH: 24.8 pg — ABNORMAL LOW (ref 26.0–34.0)
MCHC: 33.2 g/dL (ref 30.0–36.0)
MCV: 74.9 fL — ABNORMAL LOW (ref 80.0–100.0)
Platelets: 271 10*3/uL (ref 150–400)
RBC: 4.87 MIL/uL (ref 3.87–5.11)
RDW: 14.6 % (ref 11.5–15.5)
WBC: 8.8 10*3/uL (ref 4.0–10.5)
nRBC: 0 % (ref 0.0–0.2)

## 2020-05-18 LAB — BASIC METABOLIC PANEL
Anion gap: 10 (ref 5–15)
BUN: 14 mg/dL (ref 8–23)
CO2: 43 mmol/L — ABNORMAL HIGH (ref 22–32)
Calcium: 9.3 mg/dL (ref 8.9–10.3)
Chloride: 72 mmol/L — ABNORMAL LOW (ref 98–111)
Creatinine, Ser: 1.45 mg/dL — ABNORMAL HIGH (ref 0.44–1.00)
GFR, Estimated: 39 mL/min — ABNORMAL LOW (ref >60)
Glucose, Bld: 138 mg/dL — ABNORMAL HIGH (ref 70–99)
Potassium: 3.8 mmol/L (ref 3.5–5.1)
Sodium: 125 mmol/L — ABNORMAL LOW (ref 135–145)

## 2020-05-18 LAB — HEPARIN LEVEL (UNFRACTIONATED): Heparin Unfractionated: 0.45 IU/mL (ref 0.30–0.70)

## 2020-05-18 LAB — DIGOXIN LEVEL: Digoxin Level: 0.8 ng/mL (ref 0.8–2.0)

## 2020-05-18 MED ORDER — TOLVAPTAN 15 MG PO TABS
15.0000 mg | ORAL_TABLET | Freq: Once | ORAL | Status: DC
  Filled 2020-05-18: qty 1

## 2020-05-18 NOTE — Progress Notes (Addendum)
Patient ID: [name] [name], female   DOB: [birth year], 70 y.o.   MRN: [id number]     Advanced Heart Failure Rounding Note  PCP-Cardiologist: Kardie Tobb, DO   Subjective:    5/5 Milrinone increased to 0.25 mcg. Off norepi. Diuresed with lasix drip 15 mg, I/Os net negative 3300. Weight down.    Currently on milrinone 0.125 with co-ox 59%.  CVP 5 today.  Creatinine 1.3 -> 1.2 -> 1.45.   Rhythm looks like sinus tachycardia in 110s this morning.   Nauseated, had heart burn earlier today fixed with Protonix.    Objective:   Weight Range: 87.3 kg Body mass index is 34.09 kg/m.   Vital Signs:   Temp:  [97.9 F (36.6 C)-98.2 F (36.8 C)] 97.9 F (36.6 C) (05/08 0748) Pulse Rate:  [37-130] 119 (05/08 0900) Resp:  [10-25] 13 (05/08 0900) BP: (108-150)/(55-93) 136/90 (05/08 0900) SpO2:  [91 %-100 %] 95 % (05/08 0900) Weight:  [87.3 kg] 87.3 kg (05/08 0409) Last BM Date:  (PTA)  Weight change: Filed Weights   05/15/20 0500 05/16/20 0500 05/18/20 0409  Weight: 92.8 kg 92.5 kg 87.3 kg    Intake/Output:   Intake/Output Summary (Last 24 hours) at 05/18/2020 0958 Last data filed at 05/18/2020 0800 Gross per 24 hour  Intake 1026.03 ml  Output 4250 ml  Net -3223.97 ml      Physical Exam   CVP 5 General: NAD Neck: No JVD, no thyromegaly or thyroid nodule.  Lungs: Clear to auscultation bilaterally with normal respiratory effort. CV: Nondisplaced PMI.  Heart mildly tachy, regular S1/S2, no S3/S4, no murmur.  1+ edema to knees. Abdomen: Soft, nontender, no hepatosplenomegaly, no distention.  Skin: Intact without lesions or rashes.  Neurologic: Alert and oriented x 3.  Psych: Normal affect. Extremities: No clubbing or cyanosis.  HEENT: Normal.    Telemetry   ?Sinus tachy 110. Personally reviewed  Labs    CBC Recent Labs    05/17/20 0301 05/18/20 0416  WBC 8.6 8.8  HGB 11.3* 12.1  HCT 34.4* 36.5  MCV 76.1* 74.9*  PLT 229 271   Basic Metabolic Panel Recent  Labs    05/16/20 0319 05/17/20 0301 05/18/20 0416  NA 129* 128* 125*  K 4.0 3.5 3.8  CL 92* 80* 72*  CO2 30 39* 43*  GLUCOSE 163* 100* 138*  BUN 20 15 14   CREATININE 1.32* 1.23* 1.45*  CALCIUM 8.0* 8.9 9.3  MG 1.5* 1.8  --    Liver Function Tests No results for input(s): AST, ALT, ALKPHOS, BILITOT, PROT, ALBUMIN in the last 72 hours. No results for input(s): LIPASE, AMYLASE in the last 72 hours. Cardiac Enzymes No results for input(s): CKTOTAL, CKMB, CKMBINDEX, TROPONINI in the last 72 hours.  BNP: BNP (last 3 results) Recent Labs    03/03/20 1559  BNP 1,981.9*    ProBNP (last 3 results) No results for input(s): PROBNP in the last 8760 hours.   D-Dimer No results for input(s): DDIMER in the last 72 hours. Hemoglobin A1C No results for input(s): HGBA1C in the last 72 hours. Fasting Lipid Panel No results for input(s): CHOL, HDL, LDLCALC, TRIG, CHOLHDL, LDLDIRECT in the last 72 hours. Thyroid Function Tests No results for input(s): TSH, T4TOTAL, T3FREE, THYROIDAB in the last 72 hours.  Invalid input(s): FREET3  Other results:   Imaging    No results found.   Medications:     Scheduled Medications: . atorvastatin  80 mg Oral Daily  . Chlorhexidine Gluconate Cloth  6 each Topical Daily  . digoxin  0.125 mg Oral Daily  . famotidine  20 mg Oral Q2000  . feeding supplement  237 mL Oral BID BM  . insulin aspart  0-15 Units Subcutaneous Q4H  . insulin glargine  18 Units Subcutaneous QHS  . lidocaine  1 patch Transdermal Q24H  . pantoprazole  40 mg Oral Daily  . potassium chloride  40 mEq Oral Daily  . spironolactone  25 mg Oral Daily  . tolvaptan  15 mg Oral Once    Infusions: . sodium chloride Stopped (05/16/20 0600)  . amiodarone 30 mg/hr (05/18/20 0800)  . furosemide (LASIX) 200 mg in dextrose 5% 100 mL (2mg /mL) infusion 15 mg/hr (05/18/20 0800)  . heparin 1,050 Units/hr (05/18/20 0800)  . milrinone 0.125 mcg/kg/min (05/18/20 0800)    PRN  Medications: sodium chloride, ondansetron (ZOFRAN) IV, oxyCODONE, senna-docusate, simethicone    Assessment/Plan   1. Acute on chronic systolic HF ->  Cardiogenic shock - EF < 15% with severe biventricular failure - almost certainly due to tachy-induced (AF) cardiomyopathy - amiodarone for rate/rhythm control, continue 30 mg/hr.   - CO-OX 59% on milrinone 0.125 mcg. Continue at this dose today, possibly stop tomorrow. - CVP 5, stop Lasix gtt today, will likely start po diuretic tomorrow.  - Continue unna boots.  - continue digoxin and spironolactone.   2. AF with RVR/atrial tach - amiodarone for rate/rhythm control.  - continue heparin gtt.  - Rhythm looks like sinus tachy but cannot rule out AT => will get ECG this morning => atrial tachy with 2:1 block.   - Planned for TEE-DCCV Monday, need to confirm rhythm (AT vs ST) => atrial tachy with 2:1 block.   3. Acute embolic CVA  - s/p successful revascularization with systemic T-PA and mechanical thrombectomy - no clinical residual and no significant defect on MRI - c/w heparin gtt  - Continue PT/OT  4. Non-compliance  5. Bipolar d/o - will need outpatient f/u w/ mental health   6. Poorly controlled DM2 - hgb A1c 10.5  - insulin per primary team   7. PEA arrest - Had brief CPR and intubated. - On 2 liters [place].   8. Moderate Pericardial Effusion w/o Tamponade  - c/w IV diuretics - monitor hemodynamics  - Eventual repeat echo.   9. Hypokalemia - will supp   10. Leukocytosis  - WBC 13>>18>>14K> 10 but AF. ? Reactive  - CXR 5/3 w/ diffuse infiltrates/ edema - PCT elevated 2.75 but in setting of renal insufficiency  - UA negative   11. Iron-deficient Anemia - Hgb stable 10.5 -> 11.3 -> 12.1 - got feraheme 5/6   Length of Stay: 6  7/3, MD  05/18/2020, 9:58 AM  Advanced Heart Failure Team Pager [phone] (M-F; 7a - 5p)  Please contact [facility] for night-coverage after hours (5p -7a ) and weekends  on [email or web address]

## 2020-05-18 NOTE — Progress Notes (Signed)
ANTICOAGULATION CONSULT NOTE  Pharmacy Consult for Heparin Indication: atrial fibrillation  Allergies  Allergen Reactions  . Amiodarone Shortness Of Breath and Other (See Comments)    Soreness, wheezing, and weakness also  . Fruit & Vegetable Daily [Nutritional Supplements] Shortness Of Breath, Swelling and Other (See Comments)    All fruits cause tongue swelling and numbness Organic fruits are okay to eat  . Metoprolol Shortness Of Breath, Other (See Comments) and Cough    Soreness, wheezing, and weakness also  . Other Shortness Of Breath, Swelling and Other (See Comments)    Tree nuts cause tongue swelling and numbness  . Peanut-Containing Drug Products Shortness Of Breath, Swelling and Other (See Comments)    Tongue swelling and numbness  . Aspirin Nausea And Vomiting and Other (See Comments)    Cannot take due to liver issues  . Tylenol [Acetaminophen] Other (See Comments)    Cannot take due to liver issues- tolerating 650 mg tablets in 2022  . Beef-Derived Products Other (See Comments)    Limited, per patient  . Lactose Intolerance (Gi) Diarrhea and Nausea And Vomiting    Patient Measurements: Weight: 87.3 kg (192 lb 7.4 oz) Heparin Dosing Weight: 67 kg  Vital Signs: Temp: 98.1 F (36.7 C) (05/08 0356) Temp Source: Oral (05/08 0356) BP: 138/81 (05/08 0600) Pulse Rate: 113 (05/08 0600)  Labs: Recent Labs    05/16/20 0319 05/17/20 0301 05/17/20 1410 05/18/20 0416  HGB 10.5* 11.3*  --  12.1  HCT 32.5* 34.4*  --  36.5  PLT 225 229  --  271  HEPARINUNFRC 0.48 0.81* 0.52 0.45  CREATININE 1.32* 1.23*  --  1.45*    Estimated Creatinine Clearance: 38.9 mL/min (A) (by C-G formula based on SCr of 1.45 mg/dL (H)).   Medical History: Past Medical History:  Diagnosis Date  . Abdominal pain   . Atrial flutter (HCC)   . Back pain   . Chronic combined systolic and diastolic CHF (congestive heart failure) (HCC) 02/04/2020  . Constipation   . Diabetes mellitus without  complication (HCC)   . Fatigue   . Full dentures   . Hemorrhoids   . History of noncompliance with medical treatment, presenting hazards to health   . Hypertension   . Lack of adequate sleep   . Liver mass   . PAF (paroxysmal atrial fibrillation) (HCC)   . Rash    on right breast  . Wears glasses     Assessment: 32 yoF admitted with acute CVA s/p tPA 5/2. Pt on apixaban with hx AFib with poor compliance. Heparin started with no bolus and low goal.  Heparin level is therapeutic at 0.45, on 1050 units/hr. Hgb 12.1, plt 271. No s/sx of bleeding or infusion issues.   Goal of Therapy:  Heparin level 0.3 - 0.5 units/ml APTT 66 - 85 secs Monitor platelets by anticoagulation protocol: Yes   Plan:  Continue heparin infusion at 1050 units/hr Monitor daily HL, CBC, and for s/sx of bleeding  Sherron Monday, PharmD, BCCCP Clinical Pharmacist  Phone: [phone] 05/18/2020 7:20 AM  Please check AMION for all [facility] Pharmacy phone numbers After 10:00 PM, call Main Pharmacy [phone]

## 2020-05-18 NOTE — Progress Notes (Signed)
NAME:  [name] [name], MRN:  [id number], DOB:  1950-07-15, LOS: 6 ADMISSION DATE:  05/12/2020, CONSULTATION DATE:  5/2 REFERRING MD:  Corliss Skains, CHIEF COMPLAINT:  Left sided weakness, facial droop   History of Present Illness:  70 y/o female with multiple medical problems presented with left sided weakness and required TPA and mechanical thrombectomy.  Had a cardiac arrest immediately following her thrombectomy with CPR for < 5 minutes.    Pertinent  Medical History  Atrial fibrillation Medication non-compliance Systolic heart failure, LVEF 25-30% Hypertension Tricuspid regurgitation COVID 19 Hyperlipidemia  Significant Hospital Events: Including procedures, antibiotic start and stop dates in addition to other pertinent events   5/2 - Patient admitted to hospital Code Stroke with left sided weakness/facial droop. CTA shows vessel occlusion of the R ICA terminus, proximal M1 R MCA and proximal A1 R ACA with mild atherosclerotic disease within the carotid bifurcations and proximal ICAs.  Cleviprex and Labetolol started for Afib with RVR and HTN. Patient sent to IR for thrombectomy and was intubated for procedure. IR had succesful mechanical thrombectomy of the clot with complete vascularization.  After procedure, patient became hypotensive and went into a PEA arrest. Chest compressions were started and patient receive a total of 2 epi. Patient stabilized. Central line placed and patient placed on pressors (Vaso, Epi, Neo). Patient transported to 4N ICU. Came off vasopressors, was noted to be moving all four extremities after arrival to ICU 5/3 - Extubated, seen by HF   Interim History / Subjective:  Nausea has improved.  Continues to have right scapular pain related to CPR.  Objective   Blood pressure (!) 177/78, pulse (!) 112, temperature 97.9 F (36.6 C), temperature source Oral, resp. rate 19, weight 87.3 kg, SpO2 97 %. CVP:  [3 mmHg-5 mmHg] 3 mmHg      Intake/Output Summary (Last  24 hours) at 05/18/2020 1550 Last data filed at 05/18/2020 1500 Gross per 24 hour  Intake 1251.36 ml  Output 3650 ml  Net -2398.64 ml   Filed Weights   05/15/20 0500 05/16/20 0500 05/18/20 0409  Weight: 92.8 kg 92.5 kg 87.3 kg   Examination:  General: Lying in bed HENT: no icterus, Oral mucosa moist.  PULM :chest clear, now on 1-1/2 L by nasal cannula.. Tenderness to palpation of right back has improved CV: HS normal. Still tachycardic.  JVP now at sternal angle.  Improving peripheral edema with Unna boots on.  CVP measures at 5 mmHg GI: BS+, soft, nontender MSK: normal bulk and tone Neuro: awake, alert, following commands, moving all four extremities. RUE 4/5 strength.  Labs/imaging that I havepersonally reviewed  (right click and "Reselect all SmartList Selections" daily)  EKG personally reviewed shows atrial tachycardia with 2-1 block.  Resolved Hospital Problem list     Assessment & Plan:   Acute hypoxic respiratory insufficiency due to pulmonary edema-improving. Remains critically ill cardiogenic shock, acute systolic heart failure, adequate UOP overnight Atrial fibrillation now in atrial tachycardia Non-compliance with home medicine Acute ischemic stroke to R common carotid artery, embolic, s/p TPA and mechanical thrombectomy with apparent good clinical result AKI, due to cardiogenic shock MSK chest pain from CPR  Plan:  -Markedly negative fluid balance since admission.  Furosemide stopped today -Still tachycardic despite amiodarone and digoxin.  Plan to DC cardioversion tomorrow.  N.p.o. after midnight -Continue milrinone for now. -Decrease amiodarone today, consider transition once cardioverted. -Milrinone consider restarting guideline directed heart failure therapy.  Aldactone has already been initiated.  Best practice (right  click and "Reselect all SmartList Selections" daily)  Diet:  Oral advance diet per SLP  Pain/Anxiety/Delirium protocol (if indicated):  No VAP protocol (if indicated): Not indicated DVT prophylaxis: Systemic AC GI prophylaxis: N/A Glucose control:  SSI Yes Central venous access:  Yes, and it is still needed Arterial line:  Yes, and it is still needed Foley:  Yes, and it is still needed Mobility:  bed rest  PT consulted: Yes Last date of multidisciplinary goals of care discussion [5/2: [name] and daughter [name]] Code Status:  full code Disposition: remain in ICU  Labs   CBC: Recent Labs  Lab 05/12/20 1220 05/12/20 1228 05/14/20 0507 05/15/20 0335 05/16/20 0319 05/17/20 0301 05/18/20 0416  WBC 6.2   < > 18.4* 14.0* 10.2 8.6 8.8  NEUTROABS 3.4  --  15.5*  --   --   --   --   HGB 13.5   < > 11.2* 11.0* 10.5* 11.3* 12.1  HCT 42.8   < > 33.6* 33.5* 32.5* 34.4* 36.5  MCV 79.6*   < > 75.8* 76.7* 77.6* 76.1* 74.9*  PLT 242   < > 203 215 225 229 271   < > = values in this interval not displayed.    Basic Metabolic Panel: Recent Labs  Lab 05/12/20 1550 05/12/20 1602 05/13/20 0527 05/14/20 0507 05/15/20 0335 05/16/20 0319 05/17/20 0301 05/18/20 0416  NA  --    < > 134* 135 132* 129* 128* 125*  K  --    < > 3.5 3.1* 4.3 4.0 3.5 3.8  CL  --   --  102 100 101 92* 80* 72*  CO2  --   --  22 26 26 30  39* 43*  GLUCOSE  --   --  166* 99 116* 163* 100* 138*  BUN  --   --  20 23 25* 20 15 14   CREATININE  --   --  1.19* 1.28* 1.27* 1.32* 1.23* 1.45*  CALCIUM  --   --  8.5* 8.0* 7.8* 8.0* 8.9 9.3  MG 1.4*  --  2.4  --   --  1.5* 1.8  --   PHOS 4.9*  --  4.5  --   --   --   --   --    < > = values in this interval not displayed.   GFR: Estimated Creatinine Clearance: 38.9 mL/min (A) (by C-G formula based on SCr of 1.45 mg/dL (H)). Recent Labs  Lab 05/12/20 1550 05/12/20 2156 05/13/20 0527 05/13/20 1722 05/14/20 0507 05/14/20 0817 05/15/20 0335 05/16/20 0319 05/17/20 0301 05/18/20 0416  PROCALCITON  --   --   --   --   --  2.75  --   --   --   --   WBC  --   --    < >  --    < >  --  14.0* 10.2 8.6  8.8  LATICACIDVEN 3.1* 2.4*  --  1.7  --   --   --   --   --   --    < > = values in this interval not displayed.    Liver Function Tests: Recent Labs  Lab 05/12/20 1220 05/13/20 0527 05/14/20 0507  AST 47* 141* 109*  ALT 26 77* 87*  ALKPHOS 106 109 133*  BILITOT 1.0 0.8 0.9  PROT 6.4* 5.3* 5.1*  ALBUMIN 2.6* 2.1* 2.1*   No results for input(s): LIPASE, AMYLASE in the last 168 hours. No results for  input(s): AMMONIA in the last 168 hours.  ABG    Component Value Date/Time   PHART 7.401 05/12/2020 1602   PCO2ART 37.6 05/12/2020 1602   PO2ART 349 (H) 05/12/2020 1602   HCO3 23.5 05/12/2020 1602   TCO2 25 05/12/2020 1602   ACIDBASEDEF 1.0 05/12/2020 1602   O2SAT 58.8 05/18/2020 0416     Coagulation Profile: Recent Labs  Lab 05/12/20 1220  INR 1.3*    Cardiac Enzymes: No results for input(s): CKTOTAL, CKMB, CKMBINDEX, TROPONINI in the last 168 hours.  HbA1C: HbA1c, POC (controlled diabetic range)  Date/Time Value Ref Range Status  12/30/2017 10:02 AM 9.4 (A) 0.0 - 7.0 % Final  09/06/2017 01:31 PM 8.8 (A) 0.0 - 7.0 % Final   HbA1c POC (<> result, manual entry)  Date/Time Value Ref Range Status  07/26/2019 11:08 AM >15.0 4.0 - 5.6 % Final   Hgb A1c MFr Bld  Date/Time Value Ref Range Status  05/13/2020 05:27 AM 10.5 (H) 4.8 - 5.6 % Final    Comment:    (NOTE) Pre diabetes:          5.7%-6.4%  Diabetes:              >6.4%  Glycemic control for   <7.0% adults with diabetes   02/04/2020 06:57 AM 10.4 (H) 4.8 - 5.6 % Final    Comment:    (NOTE) Pre diabetes:          5.7%-6.4%  Diabetes:              >6.4%  Glycemic control for   <7.0% adults with diabetes     CBG: Recent Labs  Lab 05/17/20 1551 05/17/20 1944 05/17/20 2345 05/18/20 0657 05/18/20 1148  GLUCAP 173* 167* 105* 120* 137*    CRITICAL CARE Performed by: Lynnell Catalan   Total critical care time: 35 minutes  Critical care time was exclusive of separately billable procedures and  treating other patients.  Critical care was necessary to treat or prevent imminent or life-threatening deterioration.  Critical care was time spent personally by me on the following activities: development of treatment plan with patient and/or surrogate as well as nursing, discussions with consultants, evaluation of patient's response to treatment, examination of patient, obtaining history from patient or surrogate, ordering and performing treatments and interventions, ordering and review of laboratory studies, ordering and review of radiographic studies, pulse oximetry, re-evaluation of patient's condition and participation in multidisciplinary rounds.  Lynnell Catalan, MD [facility] ICU Physician [facility] [place] [facility]  Pager: [phone] Mobile: [phone] After hours: [phone].

## 2020-05-19 ENCOUNTER — Encounter (HOSPITAL_COMMUNITY): Payer: Self-pay

## 2020-05-19 ENCOUNTER — Inpatient Hospital Stay (HOSPITAL_COMMUNITY): Payer: Medicare Other

## 2020-05-19 ENCOUNTER — Inpatient Hospital Stay (HOSPITAL_COMMUNITY): Payer: Medicare Other | Admitting: Certified Registered Nurse Anesthetist

## 2020-05-19 ENCOUNTER — Encounter (HOSPITAL_COMMUNITY)
Admission: EM | Disposition: A | Discharge status: Skilled Nursing Facility | Payer: Self-pay | Source: Home / Self Care | Attending: Pulmonary Disease

## 2020-05-19 DIAGNOSIS — J9601 Acute respiratory failure with hypoxia: Secondary | ICD-10-CM | POA: Diagnosis not present

## 2020-05-19 DIAGNOSIS — I361 Nonrheumatic tricuspid (valve) insufficiency: Secondary | ICD-10-CM

## 2020-05-19 DIAGNOSIS — I4892 Unspecified atrial flutter: Secondary | ICD-10-CM

## 2020-05-19 HISTORY — PX: CARDIOVERSION: SHX1299

## 2020-05-19 HISTORY — PX: TEE WITHOUT CARDIOVERSION: SHX5443

## 2020-05-19 LAB — CBC
HCT: 33.4 % — ABNORMAL LOW (ref 36.0–46.0)
Hemoglobin: 11.1 g/dL — ABNORMAL LOW (ref 12.0–15.0)
MCH: 24.9 pg — ABNORMAL LOW (ref 26.0–34.0)
MCHC: 33.2 g/dL (ref 30.0–36.0)
MCV: 74.9 fL — ABNORMAL LOW (ref 80.0–100.0)
Platelets: 264 10*3/uL (ref 150–400)
RBC: 4.46 MIL/uL (ref 3.87–5.11)
RDW: 14.6 % (ref 11.5–15.5)
WBC: 10 10*3/uL (ref 4.0–10.5)
nRBC: 0 % (ref 0.0–0.2)

## 2020-05-19 LAB — PROTIME-INR
INR: 1.3 — ABNORMAL HIGH (ref 0.8–1.2)
Prothrombin Time: 16.5 seconds — ABNORMAL HIGH (ref 11.4–15.2)

## 2020-05-19 LAB — BASIC METABOLIC PANEL
Anion gap: 9 (ref 5–15)
BUN: 17 mg/dL (ref 8–23)
CO2: 45 mmol/L — ABNORMAL HIGH (ref 22–32)
Calcium: 9.1 mg/dL (ref 8.9–10.3)
Chloride: 70 mmol/L — ABNORMAL LOW (ref 98–111)
Creatinine, Ser: 1.51 mg/dL — ABNORMAL HIGH (ref 0.44–1.00)
GFR, Estimated: 37 mL/min — ABNORMAL LOW (ref >60)
Glucose, Bld: 116 mg/dL — ABNORMAL HIGH (ref 70–99)
Potassium: 3.9 mmol/L (ref 3.5–5.1)
Sodium: 124 mmol/L — ABNORMAL LOW (ref 135–145)

## 2020-05-19 LAB — GLUCOSE, CAPILLARY
Glucose-Capillary: 150 mg/dL — ABNORMAL HIGH (ref 70–99)
Glucose-Capillary: 86 mg/dL (ref 70–99)
Glucose-Capillary: 96 mg/dL (ref 70–99)
Glucose-Capillary: 132 mg/dL — ABNORMAL HIGH (ref 70–99)
Glucose-Capillary: 128 mg/dL — ABNORMAL HIGH (ref 70–99)
Glucose-Capillary: 193 mg/dL — ABNORMAL HIGH (ref 70–99)

## 2020-05-19 LAB — HEPARIN LEVEL (UNFRACTIONATED): Heparin Unfractionated: 0.44 IU/mL (ref 0.30–0.70)

## 2020-05-19 LAB — COOXEMETRY PANEL
Carboxyhemoglobin: 1.1 % (ref 0.5–1.5)
Carboxyhemoglobin: 1.3 % (ref 0.5–1.5)
Methemoglobin: 0.9 % (ref 0.0–1.5)
Methemoglobin: 1 % (ref 0.0–1.5)
O2 Saturation: 64.1 %
O2 Saturation: 66.4 %
Total hemoglobin: 17.3 g/dL — ABNORMAL HIGH (ref 12.0–16.0)
Total hemoglobin: 11.6 g/dL — ABNORMAL LOW (ref 12.0–16.0)

## 2020-05-19 LAB — MAGNESIUM: Magnesium: 1.5 mg/dL — ABNORMAL LOW (ref 1.7–2.4)

## 2020-05-19 SURGERY — ECHOCARDIOGRAM, TRANSESOPHAGEAL
Anesthesia: General

## 2020-05-19 MED ORDER — POTASSIUM CHLORIDE CRYS ER 20 MEQ PO TBCR
20.0000 meq | EXTENDED_RELEASE_TABLET | Freq: Once | ORAL | Status: AC
  Administered 2020-05-19: 20 meq via ORAL
  Filled 2020-05-19: qty 1

## 2020-05-19 MED ORDER — METOPROLOL TARTRATE 12.5 MG HALF TABLET: 12.5000 mg | ORAL_TABLET | Freq: Two times a day (BID) | ORAL | Status: DC

## 2020-05-19 MED ORDER — CHLORHEXIDINE GLUCONATE CLOTH 2 % EX PADS
6.0000 | MEDICATED_PAD | Freq: Every day | CUTANEOUS | Status: DC
  Administered 2020-05-20 – 2020-05-23 (×4): 6 via TOPICAL

## 2020-05-19 MED ORDER — SODIUM CHLORIDE 0.9 % IV SOLN: INTRAVENOUS | Status: DC

## 2020-05-19 MED ORDER — PHENYLEPHRINE HCL (PRESSORS) 10 MG/ML IV SOLN
INTRAVENOUS | Status: DC | PRN
  Administered 2020-05-19 (×2): 80 ug via INTRAVENOUS

## 2020-05-19 MED ORDER — AMIODARONE HCL 200 MG PO TABS: 200.0000 mg | ORAL_TABLET | Freq: Two times a day (BID) | ORAL | Status: DC

## 2020-05-19 MED ORDER — AMIODARONE HCL 200 MG PO TABS
200.0000 mg | ORAL_TABLET | Freq: Two times a day (BID) | ORAL | Status: DC
  Administered 2020-05-19 – 2020-05-26 (×15): 200 mg via ORAL
  Filled 2020-05-19 (×15): qty 1

## 2020-05-19 MED ORDER — POTASSIUM CHLORIDE CRYS ER 20 MEQ PO TBCR
40.0000 meq | EXTENDED_RELEASE_TABLET | Freq: Every day | ORAL | Status: DC
  Administered 2020-05-20: 40 meq via ORAL
  Filled 2020-05-19: qty 2

## 2020-05-19 MED ORDER — PROPOFOL 500 MG/50ML IV EMUL
INTRAVENOUS | Status: DC | PRN
  Administered 2020-05-19: 75 ug/kg/min via INTRAVENOUS

## 2020-05-19 MED ORDER — PROPOFOL 10 MG/ML IV BOLUS
INTRAVENOUS | Status: DC | PRN
  Administered 2020-05-19: 20 mg via INTRAVENOUS

## 2020-05-19 MED ORDER — MAGNESIUM SULFATE 4 GM/100ML IV SOLN
4.0000 g | Freq: Once | INTRAVENOUS | Status: AC
  Administered 2020-05-19: 4 g via INTRAVENOUS
  Filled 2020-05-19: qty 100

## 2020-05-19 MED ORDER — SODIUM CHLORIDE 0.9 % IV SOLN: INTRAVENOUS | Status: DC | PRN

## 2020-05-19 NOTE — Progress Notes (Addendum)
Patient ID: [name] [name], female   DOB: [birth year], 70 y.o.   MRN: [id number]     Advanced Heart Failure Rounding Note  PCP-Cardiologist: Kardie Tobb, DO   Subjective:    5/5 Milrinone increased to 0.25 mcg. Off norepi. 5/8 Lasix drip stopped. Continue on milrinone 0.125 mcg.   Currently on milrinone 0.125 with co-ox 64%.    Feeling ok. Denies shortness of breath.    Objective:   Weight Range: 85.3 kg Body mass index is 33.31 kg/m.   Vital Signs:   Temp:  [97.7 F (36.5 C)-98.5 F (36.9 C)] 98.5 F (36.9 C) (05/09 0359) Pulse Rate:  [57-125] 117 (05/09 0700) Resp:  [13-23] 22 (05/09 0700) BP: (112-177)/(64-125) 113/72 (05/09 0700) SpO2:  [95 %-100 %] 99 % (05/09 0700) Weight:  [85.3 kg] 85.3 kg (05/09 0412) Last BM Date:  (PTA)  Weight change: Filed Weights   05/16/20 0500 05/18/20 0409 05/19/20 0412  Weight: 92.5 kg 87.3 kg 85.3 kg    Intake/Output:   Intake/Output Summary (Last 24 hours) at 05/19/2020 0757 Last data filed at 05/19/2020 0700 Gross per 24 hour  Intake 923.4 ml  Output 2275 ml  Net -1351.6 ml      Physical Exam  CVP 2-3  General: No resp difficulty HEENT: normal Neck: supple. no JVD. Carotids 2+ bilat; no bruits. No lymphadenopathy or thryomegaly appreciated. RIJ  Cor: PMI nondisplaced. Tachy regular rate & rhythm. No rubs, gallops or murmurs. Lungs: clear Abdomen: soft, nontender, nondistended. No hepatosplenomegaly. No bruits or masses. Good bowel sounds. Extremities: no cyanosis, clubbing, rash, edema Neuro: alert & orientedx3, cranial nerves grossly intact. moves all 4 extremities w/o difficulty. Affect pleasant   Telemetry  Sinus Tach vs A Tach 110s   Labs    CBC Recent Labs    05/17/20 0301 05/18/20 0416  WBC 8.6 8.8  HGB 11.3* 12.1  HCT 34.4* 36.5  MCV 76.1* 74.9*  PLT 229 271   Basic Metabolic Panel Recent Labs    15/17/61 0301 05/18/20 0416 05/19/20 0255  NA 128* 125* 124*  K 3.5 3.8 3.9  CL 80* 72* 70*   CO2 39* 43* 45*  GLUCOSE 100* 138* 116*  BUN 15 14 17   CREATININE 1.23* 1.45* 1.51*  CALCIUM 8.9 9.3 9.1  MG 1.8  --  1.5*   Liver Function Tests No results for input(s): AST, ALT, ALKPHOS, BILITOT, PROT, ALBUMIN in the last 72 hours. No results for input(s): LIPASE, AMYLASE in the last 72 hours. Cardiac Enzymes No results for input(s): CKTOTAL, CKMB, CKMBINDEX, TROPONINI in the last 72 hours.  BNP: BNP (last 3 results) Recent Labs    03/03/20 1559  BNP 1,981.9*    ProBNP (last 3 results) No results for input(s): PROBNP in the last 8760 hours.   D-Dimer No results for input(s): DDIMER in the last 72 hours. Hemoglobin A1C No results for input(s): HGBA1C in the last 72 hours. Fasting Lipid Panel No results for input(s): CHOL, HDL, LDLCALC, TRIG, CHOLHDL, LDLDIRECT in the last 72 hours. Thyroid Function Tests No results for input(s): TSH, T4TOTAL, T3FREE, THYROIDAB in the last 72 hours.  Invalid input(s): FREET3  Other results:   Imaging    No results found.   Medications:     Scheduled Medications: . atorvastatin  80 mg Oral Daily  . Chlorhexidine Gluconate Cloth  6 each Topical Daily  . digoxin  0.125 mg Oral Daily  . famotidine  20 mg Oral Q2000  . feeding supplement  237  mL Oral BID BM  . insulin aspart  0-15 Units Subcutaneous Q4H  . insulin glargine  18 Units Subcutaneous QHS  . lidocaine  1 patch Transdermal Q24H  . pantoprazole  40 mg Oral Daily  . potassium chloride  40 mEq Oral Daily  . spironolactone  25 mg Oral Daily    Infusions: . sodium chloride Stopped (05/16/20 0600)  . amiodarone 30 mg/hr (05/19/20 0700)  . heparin 1,050 Units/hr (05/19/20 0700)  . magnesium sulfate bolus IVPB 50 mL/hr at 05/19/20 0700  . milrinone 0.125 mcg/kg/min (05/19/20 0700)    PRN Medications: sodium chloride, ondansetron (ZOFRAN) IV, oxyCODONE, senna-docusate, simethicone    Assessment/Plan   1. 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   - CO-OX 64% on milrinone 0.125 mcg.  Stop milrinone.  - CVP low hold diuretics.  - Continue unna boots.  - continue digoxin and spironolactone.   2. AF with RVR/atrial tach - amiodarone for rate/rhythm control.  - continue heparin gtt.  - Rhythm looks like sinus tachy but cannot rule out AT =>  atrial tachy with 2:1 block.   - Planned for TEE-DCCV today.  -EKG  Now.   3. Acute embolic CVA  - s/p successful revascularization with systemic T-PA and mechanical thrombectomy - no clinical residual and no significant defect on MRI - Continue heparin gtt  - Continue PT/OT  4. Non-compliance  5. Bipolar d/o - will need outpatient f/u w/ mental health   6. Poorly controlled DM2 - hgb A1c 10.5  - insulin per primary team   7. PEA arrest - Had brief CPR and intubated. - On 2 liters [place].   8. Moderate Pericardial Effusion w/o Tamponade  - Hold diuretics.  - monitor hemodynamics  - Eventual repeat echo.   9. Hypokalemia - Supp K   10. Leukocytosis  - WBC 13>>18>>14K> 10 but AF. ? Reactive  - CXR 5/3 w/ diffuse infiltrates/ edema - PCT elevated 2.75 but in setting of renal insufficiency  - UA negative   11. Iron-deficient Anemia - Hgb stable 10.5 -> 11.3 -> 12.1 - got feraheme 5/6  12. Hyponatremia  Sodium 124. Restrict free water.   13. Hypomagnesia Getting 4 grams Mag now.    Length of Stay: 7  [name], NP  05/19/2020, 7:57 AM  Advanced Heart Failure Team Pager [phone] (M-F; 7a - 5p)  Please contact [facility] for night-coverage after hours (5p -7a ) and weekends on [email or web address]  Patient seen and examined with the above-signed Advanced Practice Provider and/or Housestaff. I personally reviewed laboratory data, imaging studies and relevant notes. I independently examined the patient and formulated the  important aspects of the plan. I have edited the note to reflect any of my changes or salient points. I have personally discussed the plan with the patient and/or family.  Off pressors. Feels better this am. Denies CP or SOB. She is hungry. Co-ox 64% CVP 4-5   General:  Well appearing. No resp difficulty HEENT: normal Neck: supple. no JVD. Carotids 2+ bilat; no bruits. No lymphadenopathy or thryomegaly appreciated. Cor: PMI nondisplaced. Tachy regular  No rubs, gallops or murmurs. Lungs: clear Abdomen: soft, nontender, nondistended. No hepatosplenomegaly. No bruits or masses. Good bowel sounds. Extremities: no cyanosis, clubbing, rash, edema Neuro: alert & orientedx3, cranial nerves grossly intact. moves all 4 extremities w/o difficulty.  Affect pleasant  Now off milrinone. Volume status looks good. Remains in atrial tach. Will plan TEE/DC-CV today. Watch for post DC-CV bradycardia. Supp K and Mag. Continue heparin. Switch to Eliquis in 48 hours.  Arvilla Meres, MD  11:22 AM

## 2020-05-19 NOTE — Progress Notes (Signed)
eLink Physician-Brief Progress Note Patient Name: [name] [name] DOB: October 01, 1950 MRN: [id number]   Date of Service  05/19/2020  HPI/Events of Note  Received query from bedside RN regarding metoprolol that is due tonight but listed on allergy list. Pt also reportedly does not take this at home. Pt is currently on amiodarone and  Digoxin.  BP 133/68, HR 85.  eICU Interventions  Hold metoprolol dose tonight.       Intervention Category Intermediate Interventions: Medication change / dose adjustment  Larinda Buttery 05/19/2020, 9:35 PM

## 2020-05-19 NOTE — Progress Notes (Signed)
ANTICOAGULATION CONSULT NOTE  Pharmacy Consult for Heparin Indication: atrial fibrillation  Allergies  Allergen Reactions  . Amiodarone Shortness Of Breath and Other (See Comments)    Soreness, wheezing, and weakness also  . Fruit & Vegetable Daily [Nutritional Supplements] Shortness Of Breath, Swelling and Other (See Comments)    All fruits cause tongue swelling and numbness Organic fruits are okay to eat  . Metoprolol Shortness Of Breath, Other (See Comments) and Cough    Soreness, wheezing, and weakness also  . Other Shortness Of Breath, Swelling and Other (See Comments)    Tree nuts cause tongue swelling and numbness  . Peanut-Containing Drug Products Shortness Of Breath, Swelling and Other (See Comments)    Tongue swelling and numbness  . Aspirin Nausea And Vomiting and Other (See Comments)    Cannot take due to liver issues  . Tylenol [Acetaminophen] Other (See Comments)    Cannot take due to liver issues- tolerating 650 mg tablets in 2022  . Beef-Derived Products Other (See Comments)    Limited, per patient  . Lactose Intolerance (Gi) Diarrhea and Nausea And Vomiting    Patient Measurements: Weight: 85.3 kg (188 lb 0.8 oz) Heparin Dosing Weight: 67 kg  Vital Signs: Temp: 97.8 F (36.6 C) (05/09 0849) Temp Source: Oral (05/09 0849) BP: 128/63 (05/09 1000) Pulse Rate: 108 (05/09 1100)  Labs: Recent Labs    05/17/20 0301 05/17/20 1410 05/18/20 0416 05/19/20 0255 05/19/20 0956  HGB 11.3*  --  12.1  --  11.1*  HCT 34.4*  --  36.5  --  33.4*  PLT 229  --  271  --  264  HEPARINUNFRC 0.81* 0.52 0.45 0.44  --   CREATININE 1.23*  --  1.45* 1.51*  --     Estimated Creatinine Clearance: 36.9 mL/min (A) (by C-G formula based on SCr of 1.51 mg/dL (H)).   Medical History: Past Medical History:  Diagnosis Date  . Abdominal pain   . Atrial flutter (HCC)   . Back pain   . Chronic combined systolic and diastolic CHF (congestive heart failure) (HCC) 02/04/2020  .  Constipation   . Diabetes mellitus without complication (HCC)   . Fatigue   . Full dentures   . Hemorrhoids   . History of noncompliance with medical treatment, presenting hazards to health   . Hypertension   . Lack of adequate sleep   . Liver mass   . PAF (paroxysmal atrial fibrillation) (HCC)   . Rash    on right breast  . Wears glasses     Assessment: 70 yoF admitted with acute CVA s/p tPA 5/2. Pt on apixaban with hx AFib with poor compliance. Heparin started with no bolus and low goal.  Heparin level is therapeutic at 0.44, on 1050 units/hr. Hgb 11.1, plt 264. No s/sx of bleeding or infusion issues.   Goal of Therapy:  Heparin level 0.3 - 0.5 units/ml APTT 66 - 85 secs Monitor platelets by anticoagulation protocol: Yes   Plan:  Continue heparin infusion at 1050 units/hr Monitor daily heparin level, CBC, and for s/sx of bleeding  Reece Leader, Colon Flattery, [facility] Clinical Pharmacist  05/19/2020 11:08 AM   [facility] pharmacy phone numbers are listed on [email or web address]

## 2020-05-19 NOTE — Progress Notes (Signed)
NAME:  [name] [name], MRN:  [id number], DOB:  1950/02/21, LOS: 7 ADMISSION DATE:  05/12/2020, CONSULTATION DATE:  5/2 REFERRING MD:  Corliss Skains, CHIEF COMPLAINT:  Left sided weakness, facial droop   History of Present Illness:  70 y/o female with multiple medical problems presented with left sided weakness and required TPA and mechanical thrombectomy.  Had a cardiac arrest immediately following her thrombectomy with CPR for < 5 minutes.    Pertinent  Medical History  Atrial fibrillation Medication non-compliance Systolic heart failure, LVEF 25-30% Hypertension Tricuspid regurgitation COVID 19 Hyperlipidemia  Significant Hospital Events: Including procedures, antibiotic start and stop dates in addition to other pertinent events   5/2 - Patient admitted to hospital Code Stroke with left sided weakness/facial droop. CTA shows vessel occlusion of the R ICA terminus, proximal M1 R MCA and proximal A1 R ACA with mild atherosclerotic disease within the carotid bifurcations and proximal ICAs.  Cleviprex and Labetolol started for Afib with RVR and HTN. Patient sent to IR for thrombectomy and was intubated for procedure. IR had succesful mechanical thrombectomy of the clot with complete vascularization.  After procedure, patient became hypotensive and went into a PEA arrest. Chest compressions were started and patient receive a total of 2 epi. Patient stabilized. Central line placed and patient placed on pressors (Vaso, Epi, Neo). Patient transported to 4N ICU. Came off vasopressors, was noted to be moving all four extremities after arrival to ICU 5/3 - Extubated, seen by HF   Interim History / Subjective:  Continues to have nausea.  Scapular pain improved with lidocaine patch.  Underwent DC cardioversion for atrial tachycardia.  Objective   Blood pressure (!) 149/89, pulse 72, temperature (!) 97.5 F (36.4 C), temperature source Oral, resp. rate 14, height 5\' 3"  (1.6 m), weight 85.3 kg, SpO2 99  %. CVP:  [2 mmHg-8 mmHg] 8 mmHg      Intake/Output Summary (Last 24 hours) at 05/19/2020 1520 Last data filed at 05/19/2020 1300 Gross per 24 hour  Intake 868.6 ml  Output 1675 ml  Net -806.4 ml   Filed Weights   05/18/20 0409 05/19/20 0412 05/19/20 1300  Weight: 87.3 kg 85.3 kg 85.3 kg   Examination:  General: Lying in bed HENT: no icterus, Oral mucosa moist.  PULM :chest clear, now on 1-1/2 L by nasal cannula.. Tenderness to palpation of right back has improved CV: HS normal.  Now in sinus rhythm.  JVP now at sternal angle.  Improving peripheral edema with Unna boots on.  CVP measures at 5 mmHg GI: BS+, soft, nontender MSK: normal bulk and tone Neuro: awake, alert, following commands, moving all four extremities. RUE 4/5 strength.  Labs/imaging that I havepersonally reviewed  (right click and "Reselect all SmartList Selections" daily)  EKG personally reviewed shows atrial tachycardia with 2-1 block.  Resolved Hospital Problem list     Assessment & Plan:   Acute hypoxic respiratory insufficiency due to pulmonary edema-improving. Remains critically ill cardiogenic shock, acute systolic heart failure, adequate UOP overnight Atrial fibrillation/ atrial tachycardia-status post DC cardioversion Non-compliance with home medicine Acute ischemic stroke to R common carotid artery, embolic, s/p TPA and mechanical thrombectomy with apparent good clinical result AKI, due to cardiogenic shock MSK chest pain from CPR  Plan:  -Keep off furosemide for today.  We will resume maintenance dosing soon. -Follow creatinine, contraction alkalosis. -Keep off milrinone.  Consider resuming guideline directed heart failure therapy - was not ACEi at home -Consider transition to oral amiodarone now that she  is in sinus rhythm. restarting low dose metoprolol 12.5bid (on XL 100 at home) -Progressive ambulation - Transition back to oral anticoagulation.  (On Eliquis at home)  Best practice (right  click and "Reselect all SmartList Selections" daily)  Diet:  Oral  Pain/Anxiety/Delirium protocol (if indicated): No VAP protocol (if indicated): Not indicated DVT prophylaxis: Systemic AC GI prophylaxis: N/A Glucose control:  SSI Yes Central venous access:  Yes, and it is still needed Arterial line:  Yes, and it is still needed Foley:  Yes, and it is still needed Mobility:  bed rest  PT consulted: Yes Last date of multidisciplinary goals of care discussion [5/2: McQuaid and daughter [name]] -doing better Code Status:  full code Disposition: Transfer to PCU under hospitalist with heart failure following.  Orders reconciled TRH notified.  Labs   CBC: Recent Labs  Lab 05/14/20 0507 05/15/20 0335 05/16/20 0319 05/17/20 0301 05/18/20 0416 05/19/20 0956  WBC 18.4* 14.0* 10.2 8.6 8.8 10.0  NEUTROABS 15.5*  --   --   --   --   --   HGB 11.2* 11.0* 10.5* 11.3* 12.1 11.1*  HCT 33.6* 33.5* 32.5* 34.4* 36.5 33.4*  MCV 75.8* 76.7* 77.6* 76.1* 74.9* 74.9*  PLT 203 215 225 229 271 264    Basic Metabolic Panel: Recent Labs  Lab 05/12/20 1550 05/12/20 1602 05/13/20 0527 05/14/20 0507 05/15/20 0335 05/16/20 0319 05/17/20 0301 05/18/20 0416 05/19/20 0255  NA  --    < > 134*   < > 132* 129* 128* 125* 124*  K  --    < > 3.5   < > 4.3 4.0 3.5 3.8 3.9  CL  --   --  102   < > 101 92* 80* 72* 70*  CO2  --   --  22   < > 26 30 39* 43* 45*  GLUCOSE  --   --  166*   < > 116* 163* 100* 138* 116*  BUN  --   --  20   < > 25* 20 15 14 17   CREATININE  --   --  1.19*   < > 1.27* 1.32* 1.23* 1.45* 1.51*  CALCIUM  --   --  8.5*   < > 7.8* 8.0* 8.9 9.3 9.1  MG 1.4*  --  2.4  --   --  1.5* 1.8  --  1.5*  PHOS 4.9*  --  4.5  --   --   --   --   --   --    < > = values in this interval not displayed.   GFR: Estimated Creatinine Clearance: 36.9 mL/min (A) (by C-G formula based on SCr of 1.51 mg/dL (H)). Recent Labs  Lab 05/12/20 1550 05/12/20 2156 05/13/20 0527 05/13/20 1722 05/14/20 0507  05/14/20 07/14/20 05/15/20 0335 05/16/20 0319 05/17/20 0301 05/18/20 0416 05/19/20 0956  PROCALCITON  --   --   --   --   --  2.75  --   --   --   --   --   WBC  --   --    < >  --    < >  --    < > 10.2 8.6 8.8 10.0  LATICACIDVEN 3.1* 2.4*  --  1.7  --   --   --   --   --   --   --    < > = values in this interval not displayed.    Liver Function Tests:  Recent Labs  Lab 05/13/20 0527 05/14/20 0507  AST 141* 109*  ALT 77* 87*  ALKPHOS 109 133*  BILITOT 0.8 0.9  PROT 5.3* 5.1*  ALBUMIN 2.1* 2.1*   No results for input(s): LIPASE, AMYLASE in the last 168 hours. No results for input(s): AMMONIA in the last 168 hours.  ABG    Component Value Date/Time   PHART 7.401 05/12/2020 1602   PCO2ART 37.6 05/12/2020 1602   PO2ART 349 (H) 05/12/2020 1602   HCO3 23.5 05/12/2020 1602   TCO2 25 05/12/2020 1602   ACIDBASEDEF 1.0 05/12/2020 1602   O2SAT 64.1 05/19/2020 0303     Coagulation Profile: Recent Labs  Lab 05/19/20 1045  INR 1.3*    Cardiac Enzymes: No results for input(s): CKTOTAL, CKMB, CKMBINDEX, TROPONINI in the last 168 hours.  HbA1C: HbA1c, POC (controlled diabetic range)  Date/Time Value Ref Range Status  12/30/2017 10:02 AM 9.4 (A) 0.0 - 7.0 % Final  09/06/2017 01:31 PM 8.8 (A) 0.0 - 7.0 % Final   HbA1c POC (<> result, manual entry)  Date/Time Value Ref Range Status  07/26/2019 11:08 AM >15.0 4.0 - 5.6 % Final   Hgb A1c MFr Bld  Date/Time Value Ref Range Status  05/13/2020 05:27 AM 10.5 (H) 4.8 - 5.6 % Final    Comment:    (NOTE) Pre diabetes:          5.7%-6.4%  Diabetes:              >6.4%  Glycemic control for   <7.0% adults with diabetes   02/04/2020 06:57 AM 10.4 (H) 4.8 - 5.6 % Final    Comment:    (NOTE) Pre diabetes:          5.7%-6.4%  Diabetes:              >6.4%  Glycemic control for   <7.0% adults with diabetes     CBG: Recent Labs  Lab 05/18/20 2006 05/18/20 2349 05/19/20 0357 05/19/20 0846 05/19/20 1218  GLUCAP 114*  232* 86 96 132*   35 minutes spent with greater than 50% of time in counseling and coordination of care.  Lynnell Catalan, MD [facility] ICU Physician [facility]  Pager: [phone] Mobile: [phone] After hours: [phone].

## 2020-05-19 NOTE — Transfer of Care (Signed)
Immediate Anesthesia Transfer of Care Note  Patient: [name] [name]  Procedure(s) Performed: TRANSESOPHAGEAL ECHOCARDIOGRAM (TEE) (N/A ) CARDIOVERSION (N/A )  Patient Location: Endoscopy Unit  Anesthesia Type:MAC  Level of Consciousness: drowsy and patient cooperative  Airway & Oxygen Therapy: Patient Spontanous Breathing and Patient connected to nasal cannula oxygen  Post-op Assessment: Report given to RN and Post -op Vital signs reviewed and stable  Post vital signs: Reviewed and stable  Last Vitals:  Vitals Value Taken Time  BP 105/59   Temp    Pulse 82   Resp 11   SpO2 96     Last Pain:  Vitals:   05/19/20 1300  TempSrc: Oral  PainSc: 0-No pain      Patients Stated Pain Goal: 0 (79/39/03 0092)  Complications: No complications documented.

## 2020-05-19 NOTE — Progress Notes (Signed)
   Plan for TEE/DC-CV for Atrial Tach today at 1400   Vici Novick NP-C  10:18 AM

## 2020-05-19 NOTE — Anesthesia Preprocedure Evaluation (Signed)
Anesthesia Evaluation  Patient identified by MRN, date of birth, ID band Patient awake    Reviewed: Allergy & Precautions, NPO status , Patient's Chart, lab work & pertinent test results  Airway Mallampati: II  TM Distance: >3 FB Neck ROM: Full    Dental  (+) Upper Dentures, Lower Dentures   Pulmonary neg pulmonary ROS,    Pulmonary exam normal breath sounds clear to auscultation       Cardiovascular hypertension, +CHF  + dysrhythmias Atrial Fibrillation  Rhythm:Irregular Rate:Normal     Neuro/Psych negative neurological ROS  negative psych ROS   GI/Hepatic negative GI ROS, Neg liver ROS,   Endo/Other  diabetes  Renal/GU negative Renal ROS  negative genitourinary   Musculoskeletal negative musculoskeletal ROS (+)   Abdominal   Peds negative pediatric ROS (+)  Hematology negative hematology ROS (+)   Anesthesia Other Findings   Reproductive/Obstetrics negative OB ROS                             Anesthesia Physical Anesthesia Plan  ASA: III  Anesthesia Plan: General   Post-op Pain Management:    Induction: Intravenous  PONV Risk Score and Plan: 1 and Treatment may vary due to age or medical condition  Airway Management Planned: Mask  Additional Equipment:   Intra-op Plan:   Post-operative Plan: Extubation in OR  Informed Consent: I have reviewed the patients History and Physical, chart, labs and discussed the procedure including the risks, benefits and alternatives for the proposed anesthesia with the patient or authorized representative who has indicated his/her understanding and acceptance.     Dental advisory given  Plan Discussed with: CRNA and Surgeon  Anesthesia Plan Comments:         Anesthesia Quick Evaluation

## 2020-05-19 NOTE — Progress Notes (Signed)
This chaplain is present for F/U spiritual care preceding the Pt. procedure today.  The Pt. grand-daughter-Destiney is bedside and will remain with the Pt. until after the procedure.    The chaplain understands the Pt. remains grateful and feels connected to her family by their acts of love during this admission.  Prayer was shared with the Pt. and family.  The chaplain will F/U with spiritual care on Tuesday.

## 2020-05-19 NOTE — Progress Notes (Signed)
AM Magnesium 1.5 with creat 1.51 and GFR 37. ELink CCM electrolyte protocol initiated.

## 2020-05-19 NOTE — Anesthesia Procedure Notes (Signed)
Procedure Name: MAC Date/Time: 05/19/2020 1:41 PM Performed by: Kathryne Hitch, CRNA Pre-anesthesia Checklist: Patient identified, Suction available, Patient being monitored and Emergency Drugs available Patient Re-evaluated:Patient Re-evaluated prior to induction Oxygen Delivery Method: Nasal cannula Preoxygenation: Pre-oxygenation with 100% oxygen Induction Type: IV induction Placement Confirmation: positive ETCO2 Dental Injury: Teeth and Oropharynx as per pre-operative assessment

## 2020-05-19 NOTE — Progress Notes (Signed)
Called [name], pt's daughter to update her on the time that patient will be going down for the TEE and DCCV.  [name] states that her niece should be coming by to sit with her until her procedure. Consents on chart. VSS.  Delories Heinz, RN

## 2020-05-19 NOTE — Plan of Care (Signed)
  Problem: Coping: Goal: Will verbalize positive feelings about self Outcome: Progressing Goal: Will identify appropriate support needs Outcome: Progressing   Problem: Self-Care: Goal: Ability to participate in self-care as condition permits will improve Outcome: Progressing Goal: Verbalization of feelings and concerns over difficulty with self-care will improve Outcome: Progressing Goal: Ability to communicate needs accurately will improve Outcome: Progressing   Problem: Nutrition: Goal: Risk of aspiration will decrease Outcome: Progressing Goal: Dietary intake will improve Outcome: Progressing   Problem: Ischemic Stroke/TIA Tissue Perfusion: Goal: Complications of ischemic stroke/TIA will be minimized Outcome: Progressing   Problem: Clinical Measurements: Goal: Ability to maintain clinical measurements within normal limits will improve Outcome: Progressing Goal: Will remain free from infection Outcome: Progressing Goal: Diagnostic test results will improve Outcome: Progressing Goal: Respiratory complications will improve Outcome: Progressing Goal: Cardiovascular complication will be avoided Outcome: Progressing   Problem: Activity: Goal: Risk for activity intolerance will decrease Outcome: Progressing   Problem: Nutrition: Goal: Adequate nutrition will be maintained Outcome: Progressing   Problem: Coping: Goal: Level of anxiety will decrease Outcome: Progressing   Problem: Elimination: Goal: Will not experience complications related to bowel motility Outcome: Progressing Goal: Will not experience complications related to urinary retention Outcome: Progressing   Problem: Pain Managment: Goal: General experience of comfort will improve Outcome: Progressing   Problem: Safety: Goal: Ability to remain free from injury will improve Outcome: Progressing   Problem: Skin Integrity: Goal: Risk for impaired skin integrity will decrease Outcome: Progressing    Problem: Activity: Goal: Capacity to carry out activities will improve Outcome: Progressing   Problem: Cardiac: Goal: Ability to achieve and maintain adequate cardiopulmonary perfusion will improve Outcome: Progressing

## 2020-05-19 NOTE — H&P (View-Only) (Signed)
Patient ID: [name] [name], female   DOB: [birth year], 70 y.o.   MRN: [id number]     Advanced Heart Failure Rounding Note  PCP-Cardiologist: Kardie Tobb, DO   Subjective:    5/5 Milrinone increased to 0.25 mcg. Off norepi. 5/8 Lasix drip stopped. Continue on milrinone 0.125 mcg.   Currently on milrinone 0.125 with co-ox 64%.    Feeling ok. Denies shortness of breath.    Objective:   Weight Range: 85.3 kg Body mass index is 33.31 kg/m.   Vital Signs:   Temp:  [97.7 F (36.5 C)-98.5 F (36.9 C)] 98.5 F (36.9 C) (05/09 0359) Pulse Rate:  [57-125] 117 (05/09 0700) Resp:  [13-23] 22 (05/09 0700) BP: (112-177)/(64-125) 113/72 (05/09 0700) SpO2:  [95 %-100 %] 99 % (05/09 0700) Weight:  [85.3 kg] 85.3 kg (05/09 0412) Last BM Date:  (PTA)  Weight change: Filed Weights   05/16/20 0500 05/18/20 0409 05/19/20 0412  Weight: 92.5 kg 87.3 kg 85.3 kg    Intake/Output:   Intake/Output Summary (Last 24 hours) at 05/19/2020 0757 Last data filed at 05/19/2020 0700 Gross per 24 hour  Intake 923.4 ml  Output 2275 ml  Net -1351.6 ml      Physical Exam  CVP 2-3  General: No resp difficulty HEENT: normal Neck: supple. no JVD. Carotids 2+ bilat; no bruits. No lymphadenopathy or thryomegaly appreciated. RIJ  Cor: PMI nondisplaced. Tachy regular rate & rhythm. No rubs, gallops or murmurs. Lungs: clear Abdomen: soft, nontender, nondistended. No hepatosplenomegaly. No bruits or masses. Good bowel sounds. Extremities: no cyanosis, clubbing, rash, edema Neuro: alert & orientedx3, cranial nerves grossly intact. moves all 4 extremities w/o difficulty. Affect pleasant   Telemetry  Sinus Tach vs A Tach 110s   Labs    CBC Recent Labs    05/17/20 0301 05/18/20 0416  WBC 8.6 8.8  HGB 11.3* 12.1  HCT 34.4* 36.5  MCV 76.1* 74.9*  PLT 229 271   Basic Metabolic Panel Recent Labs    15/17/61 0301 05/18/20 0416 05/19/20 0255  NA 128* 125* 124*  K 3.5 3.8 3.9  CL 80* 72* 70*   CO2 39* 43* 45*  GLUCOSE 100* 138* 116*  BUN 15 14 17   CREATININE 1.23* 1.45* 1.51*  CALCIUM 8.9 9.3 9.1  MG 1.8  --  1.5*   Liver Function Tests No results for input(s): AST, ALT, ALKPHOS, BILITOT, PROT, ALBUMIN in the last 72 hours. No results for input(s): LIPASE, AMYLASE in the last 72 hours. Cardiac Enzymes No results for input(s): CKTOTAL, CKMB, CKMBINDEX, TROPONINI in the last 72 hours.  BNP: BNP (last 3 results) Recent Labs    03/03/20 1559  BNP 1,981.9*    ProBNP (last 3 results) No results for input(s): PROBNP in the last 8760 hours.   D-Dimer No results for input(s): DDIMER in the last 72 hours. Hemoglobin A1C No results for input(s): HGBA1C in the last 72 hours. Fasting Lipid Panel No results for input(s): CHOL, HDL, LDLCALC, TRIG, CHOLHDL, LDLDIRECT in the last 72 hours. Thyroid Function Tests No results for input(s): TSH, T4TOTAL, T3FREE, THYROIDAB in the last 72 hours.  Invalid input(s): FREET3  Other results:   Imaging    No results found.   Medications:     Scheduled Medications: . atorvastatin  80 mg Oral Daily  . Chlorhexidine Gluconate Cloth  6 each Topical Daily  . digoxin  0.125 mg Oral Daily  . famotidine  20 mg Oral Q2000  . feeding supplement  237  mL Oral BID BM  . insulin aspart  0-15 Units Subcutaneous Q4H  . insulin glargine  18 Units Subcutaneous QHS  . lidocaine  1 patch Transdermal Q24H  . pantoprazole  40 mg Oral Daily  . potassium chloride  40 mEq Oral Daily  . spironolactone  25 mg Oral Daily    Infusions: . sodium chloride Stopped (05/16/20 0600)  . amiodarone 30 mg/hr (05/19/20 0700)  . heparin 1,050 Units/hr (05/19/20 0700)  . magnesium sulfate bolus IVPB 50 mL/hr at 05/19/20 0700  . milrinone 0.125 mcg/kg/min (05/19/20 0700)    PRN Medications: sodium chloride, ondansetron (ZOFRAN) IV, oxyCODONE, senna-docusate, simethicone    Assessment/Plan   1. 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   - CO-OX 64% on milrinone 0.125 mcg.  Stop milrinone.  - CVP low hold diuretics.  - Continue unna boots.  - continue digoxin and spironolactone.   2. AF with RVR/atrial tach - amiodarone for rate/rhythm control.  - continue heparin gtt.  - Rhythm looks like sinus tachy but cannot rule out AT =>  atrial tachy with 2:1 block.   - Planned for TEE-DCCV today.  -EKG  Now.   3. Acute embolic CVA  - s/p successful revascularization with systemic T-PA and mechanical thrombectomy - no clinical residual and no significant defect on MRI - Continue heparin gtt  - Continue PT/OT  4. Non-compliance  5. Bipolar d/o - will need outpatient f/u w/ mental health   6. Poorly controlled DM2 - hgb A1c 10.5  - insulin per primary team   7. PEA arrest - Had brief CPR and intubated. - On 2 liters Peterson.   8. Moderate Pericardial Effusion w/o Tamponade  - Hold diuretics.  - monitor hemodynamics  - Eventual repeat echo.   9. Hypokalemia - Supp K   10. Leukocytosis  - WBC 13>>18>>14K> 10 but AF. ? Reactive  - CXR 5/3 w/ diffuse infiltrates/ edema - PCT elevated 2.75 but in setting of renal insufficiency  - UA negative   11. Iron-deficient Anemia - Hgb stable 10.5 -> 11.3 -> 12.1 - got feraheme 5/6  12. Hyponatremia  Sodium 124. Restrict free water.   13. Hypomagnesia Getting 4 grams Mag now.    Length of Stay: 7  [name], NP  05/19/2020, 7:57 AM  Advanced Heart Failure Team Pager [phone] (M-F; 7a - 5p)  Please contact [facility] for night-coverage after hours (5p -7a ) and weekends on [email or web address]  Patient seen and examined with the above-signed Advanced Practice Provider and/or Housestaff. I personally reviewed laboratory data, imaging studies and relevant notes. I independently examined the patient and formulated the  important aspects of the plan. I have edited the note to reflect any of my changes or salient points. I have personally discussed the plan with the patient and/or family.  Off pressors. Feels better this am. Denies CP or SOB. She is hungry. Co-ox 64% CVP 4-5   General:  Well appearing. No resp difficulty HEENT: normal Neck: supple. no JVD. Carotids 2+ bilat; no bruits. No lymphadenopathy or thryomegaly appreciated. Cor: PMI nondisplaced. Tachy regular  No rubs, gallops or murmurs. Lungs: clear Abdomen: soft, nontender, nondistended. No hepatosplenomegaly. No bruits or masses. Good bowel sounds. Extremities: no cyanosis, clubbing, rash, edema Neuro: alert & orientedx3, cranial nerves grossly intact. moves all 4 extremities w/o difficulty.  Affect pleasant  Now off milrinone. Volume status looks good. Remains in atrial tach. Will plan TEE/DC-CV today. Watch for post DC-CV bradycardia. Supp K and Mag. Continue heparin. Switch to Eliquis in 48 hours.  Arvilla Meres, MD  11:22 AM

## 2020-05-19 NOTE — Interval H&P Note (Signed)
History and Physical Interval Note:  05/19/2020 1:47 PM  [name]  has presented today for surgery, with the diagnosis of atrial tachycardia.  The various methods of treatment have been discussed with the patient and family. After consideration of risks, benefits and other options for treatment, the patient has consented to  Procedure(s): TRANSESOPHAGEAL ECHOCARDIOGRAM (TEE) (N/A) CARDIOVERSION (N/A) as a surgical intervention.  The patient's history has been reviewed, patient examined, no change in status, stable for surgery.  I have reviewed the patient's chart and labs.  Questions were answered to the patient's satisfaction.     [name]

## 2020-05-19 NOTE — CV Procedure (Signed)
   TRANSESOPHAGEAL ECHOCARDIOGRAM GUIDED DIRECT CURRENT CARDIOVERSION  NAME:  [name] [name]   MRN: [id number] DOB:  11/26/1950   ADMIT DATE: 05/12/2020  INDICATIONS:  Atrial tachycardia  PROCEDURE:   Informed consent was obtained prior to the procedure. The risks, benefits and alternatives for the procedure were discussed and the patient comprehended these risks.  Risks include, but are not limited to, cough, sore throat, vomiting, nausea, somnolence, esophageal and stomach trauma or perforation, bleeding, low blood pressure, aspiration, pneumonia, infection, trauma to the teeth and death.    After a procedural time-out,  the patient was sedated by the anesthesia service. The transesophageal probe was inserted in the esophagus and stomach without difficulty and multiple views were obtained.   FINDINGS:  LEFT VENTRICLE: EF = 20%   RIGHT VENTRICLE: Severe HK  LEFT ATRIUM: Mildly dilated  LEFT ATRIAL APPENDAGE: + smoke. No Clot.   RIGHT ATRIUM: Normal  AORTIC VALVE:  Trileaflet. Trivial AI  MITRAL VALVE:    Normal No MR  TRICUSPID VALVE: Normal moderate TR  PULMONIC VALVE: Normal Triv PR  INTERATRIAL SEPTUM: No PFO/ASD  PERICARDIUM: Small to moderate effusion around RA  DESCENDING AORTA: Mild plaque   CARDIOVERSION:     Indications:  Atrial Tachycardia  Procedure Details:  Once the TEE was complete, the patient had the defibrillator pads placed in the anterior and posterior position. Once an appropriate level of sedation was achieved, the patient received a single biphasic, synchronized 200J shock with prompt conversion to sinus rhythm. No apparent complications.   Arvilla Meres, MD  2:12 PM

## 2020-05-19 NOTE — Progress Notes (Signed)
  Echocardiogram Echocardiogram Transesophageal has been performed.  [name] [name] 05/19/2020, 2:12 PM

## 2020-05-19 NOTE — Progress Notes (Signed)
Report given to Endo RN. Consents signed and are in the chart. Pt has been NPO since midnight except important ordered meds and MD aware. Monitoring closely. Attempted to call daughter to update her on the time of the TEE and Cardioversion, call went to voicemail. Will attempt in a few minutes. Pt resting in bed. VSS.  Delories Heinz, RN

## 2020-05-19 NOTE — Anesthesia Postprocedure Evaluation (Signed)
Anesthesia Post Note  Patient: [name] [name]  Procedure(s) Performed: TRANSESOPHAGEAL ECHOCARDIOGRAM (TEE) (N/A ) CARDIOVERSION (N/A )     Patient location during evaluation: PACU Anesthesia Type: General Level of consciousness: awake and alert Pain management: pain level controlled Vital Signs Assessment: post-procedure vital signs reviewed and stable Respiratory status: spontaneous breathing, nonlabored ventilation, respiratory function stable and patient connected to nasal cannula oxygen Cardiovascular status: blood pressure returned to baseline and stable Postop Assessment: no apparent nausea or vomiting Anesthetic complications: no   No complications documented.  Last Vitals:  Vitals:   05/19/20 1404 05/19/20 1414  BP: (!) 105/59 121/69  Pulse: 82 82  Resp: 11 12  Temp: (!) 36.4 C   SpO2: 98% 98%    Last Pain:  Vitals:   05/19/20 1404  TempSrc: Oral  PainSc:                  Elizabethann Lackey S

## 2020-05-19 NOTE — Progress Notes (Signed)
Orthopedic Tech Progress Note Patient Details:  [name] [name] [birth year] [id number]  Ortho Devices Type of Ortho Device: [name] boot Ortho Device/Splint Location: BLE Ortho Device/Splint Interventions: Ordered,Application,Adjustment   Post Interventions Patient Tolerated: Well Instructions Provided: Care of device   Donald Pore 05/19/2020, 5:49 PM

## 2020-05-19 NOTE — Progress Notes (Signed)
Noted that pt has metoprolol on her allergy list with an intolerance and SOB. Called [facility] and SW nurse and passed information on to her. She states that she will talk with the physician and make changes as appropriate.

## 2020-05-20 LAB — BASIC METABOLIC PANEL
Anion gap: 8 (ref 5–15)
BUN: 17 mg/dL (ref 8–23)
CO2: 40 mmol/L — ABNORMAL HIGH (ref 22–32)
Calcium: 8.6 mg/dL — ABNORMAL LOW (ref 8.9–10.3)
Chloride: 75 mmol/L — ABNORMAL LOW (ref 98–111)
Creatinine, Ser: 1.4 mg/dL — ABNORMAL HIGH (ref 0.44–1.00)
GFR, Estimated: 41 mL/min — ABNORMAL LOW (ref >60)
Glucose, Bld: 126 mg/dL — ABNORMAL HIGH (ref 70–99)
Potassium: 4 mmol/L (ref 3.5–5.1)
Sodium: 123 mmol/L — ABNORMAL LOW (ref 135–145)

## 2020-05-20 LAB — COOXEMETRY PANEL
Carboxyhemoglobin: 1.6 % — ABNORMAL HIGH (ref 0.5–1.5)
Methemoglobin: 1.1 % (ref 0.0–1.5)
O2 Saturation: 73.7 %
Total hemoglobin: 10.3 g/dL — ABNORMAL LOW (ref 12.0–16.0)

## 2020-05-20 LAB — GLUCOSE, CAPILLARY
Glucose-Capillary: 139 mg/dL — ABNORMAL HIGH (ref 70–99)
Glucose-Capillary: 144 mg/dL — ABNORMAL HIGH (ref 70–99)
Glucose-Capillary: 185 mg/dL — ABNORMAL HIGH (ref 70–99)
Glucose-Capillary: 194 mg/dL — ABNORMAL HIGH (ref 70–99)
Glucose-Capillary: 280 mg/dL — ABNORMAL HIGH (ref 70–99)
Glucose-Capillary: 93 mg/dL (ref 70–99)

## 2020-05-20 LAB — CBC
HCT: 29.4 % — ABNORMAL LOW (ref 36.0–46.0)
Hemoglobin: 10 g/dL — ABNORMAL LOW (ref 12.0–15.0)
MCH: 25.4 pg — ABNORMAL LOW (ref 26.0–34.0)
MCHC: 34 g/dL (ref 30.0–36.0)
MCV: 74.8 fL — ABNORMAL LOW (ref 80.0–100.0)
Platelets: 263 10*3/uL (ref 150–400)
RBC: 3.93 MIL/uL (ref 3.87–5.11)
RDW: 14.9 % (ref 11.5–15.5)
WBC: 13.3 10*3/uL — ABNORMAL HIGH (ref 4.0–10.5)
nRBC: 0 % (ref 0.0–0.2)

## 2020-05-20 LAB — HEPATIC FUNCTION PANEL
ALT: 37 U/L (ref 0–44)
AST: 37 U/L (ref 15–41)
Albumin: 2.1 g/dL — ABNORMAL LOW (ref 3.5–5.0)
Alkaline Phosphatase: 194 U/L — ABNORMAL HIGH (ref 38–126)
Bilirubin, Direct: 0.4 mg/dL — ABNORMAL HIGH (ref 0.0–0.2)
Indirect Bilirubin: 0.9 mg/dL (ref 0.3–0.9)
Total Bilirubin: 1.3 mg/dL — ABNORMAL HIGH (ref 0.3–1.2)
Total Protein: 6.1 g/dL — ABNORMAL LOW (ref 6.5–8.1)

## 2020-05-20 LAB — HEPARIN LEVEL (UNFRACTIONATED): Heparin Unfractionated: 0.52 IU/mL (ref 0.30–0.70)

## 2020-05-20 LAB — MAGNESIUM: Magnesium: 2 mg/dL (ref 1.7–2.4)

## 2020-05-20 MED ORDER — POLYETHYLENE GLYCOL 3350 17 G PO PACK
17.0000 g | PACK | Freq: Every day | ORAL | Status: DC
  Administered 2020-05-20 – 2020-05-24 (×5): 17 g via ORAL
  Filled 2020-05-20 (×5): qty 1

## 2020-05-20 MED ORDER — TOLVAPTAN 15 MG PO TABS
15.0000 mg | ORAL_TABLET | Freq: Once | ORAL | Status: AC
  Administered 2020-05-20: 15 mg via ORAL
  Filled 2020-05-20: qty 1

## 2020-05-20 MED ORDER — BISACODYL 10 MG RE SUPP
10.0000 mg | Freq: Every day | RECTAL | Status: DC | PRN
  Administered 2020-05-25: 10 mg via RECTAL
  Filled 2020-05-20 (×2): qty 1

## 2020-05-20 MED ORDER — POTASSIUM CHLORIDE 20 MEQ PO PACK
40.0000 meq | PACK | Freq: Every day | ORAL | Status: DC
  Administered 2020-05-21: 40 meq via ORAL
  Filled 2020-05-20: qty 2

## 2020-05-20 MED ORDER — SENNOSIDES-DOCUSATE SODIUM 8.6-50 MG PO TABS
2.0000 | ORAL_TABLET | Freq: Two times a day (BID) | ORAL | Status: DC
  Administered 2020-05-20 – 2020-05-26 (×12): 2 via ORAL
  Filled 2020-05-20 (×13): qty 2

## 2020-05-20 NOTE — Progress Notes (Signed)
[facility] Physician-Brief Progress Note Patient Name: [name] [name] DOB: [birth year] MRN: [id number]   Date of Service  05/20/2020  HPI/Events of Note  Notified that nurse was assisting patient and became weak and nurse assisted the patient to the floor.  No injuries noted.  Pt is resting comfortably. Vital signs stable.  eICU Interventions  Continue to monitor.     Intervention Category Minor Interventions: Other:  Larinda Buttery 05/20/2020, 6:33 AM

## 2020-05-20 NOTE — Progress Notes (Signed)
Physical Therapy Treatment Patient Details Name: [name] [name] MRN: [id number] DOB: [birth year] Today's Date: 05/20/2020    History of Present Illness Pt is a 70 y.o. female admitted 05/12/20 with L-side weakness. Pt received tPA, s/p mechanical thrombectomy; post-procedure PEA arrest, received CPR <5-min. CTA showed R MCA occlusion with successful revascularization. ETT 5/2-5/3. S/p TEEE/DCCV on 5/9 with return to NSR. PMH includes DM, HF, HTN, afib, COVID (01/2020).   PT Comments    Pt slowly progressing with mobility; limited by fatigue and symptomatic hypotension this session (see values below). Pt tolerated brief bouts of standing activity with RW and min guard; pt c/o nausea, fatigue and "feeling weak." Pt requesting SNF as she does not feel she could tolerate intensity of CIR per prior discussion with PT. Recommend SNF-level therapies to maximize funcitonal mobility and independence prior to return home.  SpO2 94-96% on RA when pleth reliable  Orthostatic BPs Supine 130/85 (99), HR 91  Sitting 138/87 (100), HR 91  Standing 107/63 (76), HR 100  Post-standing transfer 99/65 (74), HR 74  Seated ~2-min 112/78 (89)  Transfer to BSC 95/84 (90)  Seated w/ BLEs reclined 125/72     Follow Up Recommendations  SNF;Supervision/Assistance - 24 hour     Equipment Recommendations  Rolling walker with 5" wheels;3in1 (PT)    Recommendations for Other Services       Precautions / Restrictions Precautions Precautions: Fall;Other (comment) Precaution Comments: (+) orthostatic hypotension 5/10; watch SpO2 (does not wear O2 baseline) Restrictions Weight Bearing Restrictions: No    Mobility  Bed Mobility               General bed mobility comments: received sitting EOB with OT    Transfers Overall transfer level: Needs assistance Equipment used: Rolling walker (2 wheeled) Transfers: Sit to/from Stand Sit to Stand: Min guard;+2 safety/equipment         General transfer  comment: Multiple sit<>stands from EOB, recliner and BSC to RW; min guard for balance; +2 for safety as pt with symptomatic orthostatic hypotension, had near-syncope with nursing earlier; repeated verbal cues for hand placement  Ambulation/Gait Ambulation/Gait assistance: Min guard;+2 safety/equipment Gait Distance (Feet): 4 Feet Assistive device: Rolling walker (2 wheeled) Gait Pattern/deviations: Step-to pattern;Shuffle;Trunk flexed Gait velocity: Decreased   General Gait Details: Slow, fatigued gait with RW and min guard, pivotal steps from bed>recliner<>BSC; deferred further mobility secondary to symptomatic hypotension   Stairs             Wheelchair Mobility    Modified Rankin (Stroke Patients Only)       Balance Overall balance assessment: Needs assistance   Sitting balance-Leahy Scale: Fair     Standing balance support: Bilateral upper extremity supported;Single extremity supported Standing balance-Leahy Scale: Poor Standing balance comment: Reliant on single UE support when performing pericare while standing                            Cognition Arousal/Alertness: Awake/alert Behavior During Therapy: Flat affect Overall Cognitive Status: Impaired/Different from baseline Area of Impairment: Following commands;Safety/judgement;Awareness;Memory;Problem solving;Attention                   Current Attention Level: Sustained;Selective Memory: Decreased short-term memory Following Commands: Follows one step commands with increased time Safety/Judgement: Decreased awareness of deficits;Decreased awareness of safety Awareness: Emergent Problem Solving: Slow processing;Requires verbal cues        Exercises      General Comments General comments (skin  integrity, edema, etc.): SpO2 99% on 2L O2 [place]; [place] removed and maintaining 94-96% on RA when pleth reliable. BP from 138/87 sitting, down to 95/84 while seated on BSC; pt c/o nausea (RN aware of  vitals, nausea)      Pertinent Vitals/Pain Pain Assessment: Faces Faces Pain Scale: Hurts a little bit Pain Location: back Pain Descriptors / Indicators: Discomfort Pain Intervention(s): Monitored during session;Limited activity within patient's tolerance    Home Living                      Prior Function            PT Goals (current goals can now be found in the care plan section) Progress towards PT goals: Progressing toward goals (slowly)    Frequency    Min 2X/week      PT Plan Frequency needs to be updated    Co-evaluation              AM-PAC PT "6 Clicks" Mobility   Outcome Measure  Help needed turning from your back to your side while in a flat bed without using bedrails?: A Little Help needed moving from lying on your back to sitting on the side of a flat bed without using bedrails?: A Little Help needed moving to and from a bed to a chair (including a wheelchair)?: A Little Help needed standing up from a chair using your arms (e.g., wheelchair or bedside chair)?: A Little Help needed to walk in hospital room?: A Little Help needed climbing 3-5 steps with a railing? : A Lot 6 Click Score: 17    End of Session Equipment Utilized During Treatment: Gait belt;Oxygen Activity Tolerance: Patient tolerated treatment well;Patient limited by fatigue;Treatment limited secondary to medical complications (Comment) Patient left: in chair;with call bell/phone within reach;with chair alarm set Nurse Communication: Mobility status PT Visit Diagnosis: Unsteadiness on feet (R26.81);Other abnormalities of gait and mobility (R26.89);Muscle weakness (generalized) (M62.81);Difficulty in walking, not elsewhere classified (R26.2)     Time: 4174-0814 PT Time Calculation (min) (ACUTE ONLY): 22 min  Charges:  $Therapeutic Activity: 8-22 mins                    Ina Homes, PT, DPT Acute Rehabilitation Services  Pager [phone] Office [phone]  Malachy Chamber 05/20/2020, 1:47 PM

## 2020-05-20 NOTE — Progress Notes (Signed)
ANTICOAGULATION CONSULT NOTE  Pharmacy Consult for Heparin Indication: atrial fibrillation  Allergies  Allergen Reactions  . Amiodarone Shortness Of Breath and Other (See Comments)    Soreness, wheezing, and weakness also  . Fruit & Vegetable Daily [Nutritional Supplements] Shortness Of Breath, Swelling and Other (See Comments)    All fruits cause tongue swelling and numbness Organic fruits are okay to eat  . Metoprolol Shortness Of Breath, Other (See Comments) and Cough    Soreness, wheezing, and weakness also  . Other Shortness Of Breath, Swelling and Other (See Comments)    Tree nuts cause tongue swelling and numbness  . Peanut-Containing Drug Products Shortness Of Breath, Swelling and Other (See Comments)    Tongue swelling and numbness  . Aspirin Nausea And Vomiting and Other (See Comments)    Cannot take due to liver issues  . Tylenol [Acetaminophen] Other (See Comments)    Cannot take due to liver issues- tolerating 650 mg tablets in 2022  . Beef-Derived Products Other (See Comments)    Limited, per patient  . Lactose Intolerance (Gi) Diarrhea and Nausea And Vomiting    Patient Measurements: Height: 5\' 3"  (160 cm) Weight: 81.9 kg (180 lb 8.9 oz) IBW/kg (Calculated) : 52.4 Heparin Dosing Weight: 67 kg  Vital Signs: Temp: 98.3 F (36.8 C) (05/10 0803) Temp Source: Oral (05/10 0803) BP: 132/67 (05/10 0600) Pulse Rate: 87 (05/10 0600)  Labs: Recent Labs    05/18/20 0416 05/19/20 0255 05/19/20 0956 05/19/20 1045 05/20/20 0441  HGB 12.1  --  11.1*  --  10.0*  HCT 36.5  --  33.4*  --  29.4*  PLT 271  --  264  --  263  LABPROT  --   --   --  16.5*  --   INR  --   --   --  1.3*  --   HEPARINUNFRC 0.45 0.44  --   --  0.52  CREATININE 1.45* 1.51*  --   --  1.40*    Estimated Creatinine Clearance: 39 mL/min (A) (by C-G formula based on SCr of 1.4 mg/dL (H)).   Medical History: Past Medical History:  Diagnosis Date  . Abdominal pain   . Atrial flutter (HCC)    . Back pain   . Chronic combined systolic and diastolic CHF (congestive heart failure) (HCC) 02/04/2020  . Constipation   . Diabetes mellitus without complication (HCC)   . Fatigue   . Full dentures   . Hemorrhoids   . History of noncompliance with medical treatment, presenting hazards to health   . Hypertension   . Lack of adequate sleep   . Liver mass   . PAF (paroxysmal atrial fibrillation) (HCC)   . Rash    on right breast  . Wears glasses     Assessment: 58 yoF admitted with acute CVA s/p tPA 5/2. Pt on apixaban with hx AFib with poor compliance. Heparin started with no bolus and low goal.  Heparin level is therapeutic at 0.52, on 1050 units/hr. Hgb 11.1>10, plt 263. No s/sx of bleeding or infusion issues.   Goal of Therapy:  Heparin level 0.3 - 0.5 units/ml APTT 66 - 85 secs Monitor platelets by anticoagulation protocol: Yes   Plan:  Continue heparin infusion at 1050 units/hr Monitor daily heparin level, CBC, and for s/sx of bleeding  7/2, [facility] Clinical Pharmacist  05/20/2020 8:06 AM   [facility] pharmacy phone numbers are listed on [email or web address]

## 2020-05-20 NOTE — Progress Notes (Signed)
Occupational Therapy Treatment Patient Details Name: [name] [name] MRN: [id number] DOB: August 18, 1950 Today's Date: 05/20/2020    History of present illness Pt is a 70 y.o. female admitted 05/12/20 with L-side weakness. Pt received tPA, s/p mechanical thrombectomy; post-procedure PEA arrest, received CPR <5-min. CTA showed R MCA occlusion with successful revascularization. ETT 5/2-5/3. S/p TEEE/DCCV on 5/9 with return to NSR. PMH includes DM, HF, HTN, afib, COVID (01/2020).   OT comments  Patient supine in bed and agreeable to OT session with increased encouragement.  Patient transitioned to EOB with min assist, sitting EOB reports she would feel more comfortable with 2+ assist for transfer due to "feeling weak", PT assisted with transfer into standing at to [facility].  Reports nausea with standing, + orthostatic hypotension today (Sitting 138/87 to standing at 107/63, post standing transfer 99/65 and after sitting 2 minutes 112/78; then transfer to [facility] 95/84, seated in recliner 125/72). Able to complete transfers with min guard assist, toileting with min assist.  Fatigues easily.  Will follow acutely, continue to recommend SNF at dc.    Follow Up Recommendations  SNF    Equipment Recommendations  3 in 1 bedside commode    Recommendations for Other Services      Precautions / Restrictions Precautions Precautions: Fall;Other (comment) Precaution Comments: (+) orthostatic hypotension 5/10; watch SpO2 (does not wear O2 baseline) Restrictions Weight Bearing Restrictions: No       Mobility Bed Mobility Overal bed mobility: Needs Assistance Bed Mobility: Supine to Sit     Supine to sit: Min assist     General bed mobility comments: for trunk support to ascend    Transfers Overall transfer level: Needs assistance Equipment used: Rolling walker (2 wheeled) Transfers: Sit to/from Stand Sit to Stand: Min guard;+2 safety/equipment         General transfer comment: Multiple sit<>stands  from EOB, recliner and BSC to RW; min guard for balance; +2 for safety as pt with symptomatic orthostatic hypotension, had near-syncope with nursing earlier; repeated verbal cues for hand placement    Balance Overall balance assessment: Needs assistance Sitting-balance support: No upper extremity supported Sitting balance-Leahy Scale: Fair     Standing balance support: Bilateral upper extremity supported;Single extremity supported;During functional activity Standing balance-Leahy Scale: Poor Standing balance comment: Reliant on single UE support when performing pericare while standing                           ADL either performed or assessed with clinical judgement   ADL Overall ADL's : Needs assistance/impaired     Grooming: Set up;Sitting                   Toilet Transfer: Min guard;+2 for safety/equipment;BSC;RW;Stand-pivot   Toileting- Clothing Manipulation and Hygiene: Minimal assistance;Sit to/from stand Toileting - Clothing Manipulation Details (indicate cue type and reason): for clothing mgmt during hygiene     Functional mobility during ADLs: Rolling walker;Min guard General ADL Comments: limited by fatigue, orthostatic hypotension, weakness     Vision       Perception     Praxis      Cognition Arousal/Alertness: Awake/alert Behavior During Therapy: Flat affect Overall Cognitive Status: Impaired/Different from baseline Area of Impairment: Following commands;Safety/judgement;Awareness;Memory;Problem solving;Attention                   Current Attention Level: Sustained;Selective Memory: Decreased short-term memory Following Commands: Follows one step commands with increased time Safety/Judgement: Decreased awareness of  deficits;Decreased awareness of safety Awareness: Emergent Problem Solving: Slow processing;Requires verbal cues General Comments: pt with flat affect and requires increased time for processing/following commands.  fearful of falling and reports just "not feeling good". requires max encouragement for OOB.        Exercises     Shoulder Instructions       General Comments SpO2 99% on 2L O2 [place]; [place] removed and maintaining 94-96% on RA when pleth reliable. BP from 138/87 sitting, down to 95/84 while seated on BSC; pt c/o nausea (RN aware of vitals, nausea    Pertinent Vitals/ Pain       Pain Assessment: Faces Faces Pain Scale: Hurts a little bit Pain Location: back Pain Descriptors / Indicators: Discomfort Pain Intervention(s): Limited activity within patient's tolerance;Monitored during session;Repositioned  Home Living                                          Prior Functioning/Environment              Frequency  Min 2X/week        Progress Toward Goals  OT Goals(current goals can now be found in the care plan section)  Progress towards OT goals: Progressing toward goals  Acute Rehab OT Goals Patient Stated Goal: get back to independent OT Goal Formulation: With patient  Plan Discharge plan remains appropriate;Frequency remains appropriate    Co-evaluation    PT/OT/SLP Co-Evaluation/Treatment: Yes Reason for Co-Treatment: For patient/therapist safety;To address functional/ADL transfers   OT goals addressed during session: ADL's and self-care      AM-PAC OT "6 Clicks" Daily Activity     Outcome Measure   Help from another person eating meals?: None Help from another person taking care of personal grooming?: A Little Help from another person toileting, which includes using toliet, bedpan, or urinal?: A Little Help from another person bathing (including washing, rinsing, drying)?: A Little Help from another person to put on and taking off regular upper body clothing?: A Little Help from another person to put on and taking off regular lower body clothing?: A Lot 6 Click Score: 18    End of Session Equipment Utilized During Treatment: Gait belt;Rolling  walker;Oxygen  OT Visit Diagnosis: Unsteadiness on feet (R26.81);Other abnormalities of gait and mobility (R26.89);Pain;Muscle weakness (generalized) (M62.81);Other symptoms and signs involving cognitive function   Activity Tolerance Patient tolerated treatment well   Patient Left in chair;with call bell/phone within reach;with chair alarm set   Nurse Communication Mobility status        Time: 9407-6808 OT Time Calculation (min): 31 min  Charges: OT General Charges $OT Visit: 1 Visit OT Treatments $Self Care/Home Management : 8-22 mins  Barry Brunner, OT Acute Rehabilitation Services Pager [phone] Office [phone]    Chancy Milroy 05/20/2020, 3:15 PM

## 2020-05-20 NOTE — Progress Notes (Signed)
PROGRESS NOTE    [name] [name]  GYB:[id number] DOB: 11/10/1950 DOA: 05/12/2020 PCP: Dana Allan, MD    Chief Complaint  Patient presents with  . Code Stroke    Brief Narrative:   70 year old lady with prior history of atrial fibrillation, chronic systolic heart failure with left ventricular ejection fraction of 25 to 30%, hypertension, hyperlipidemia, history of COVID-19 infection in the past Presents with left-sided weakness, required tPA and mechanical thrombectomy, had a cardiac arrest/PEA arrest immediately following her thrombectomy, underwent CPR for less than 5 minutes, intubated, started on pressors transferred to [facility] service.  Patient was extubated on 05/13/2020 and was weaned off vasopressors.  Patient also underwent TEE/DCCV on 05/19/20 and currently in normal sinus rhythm. She was transferred to [facility] on 05/20/2020  Assessment & Plan:   Active Problems:   Stroke (cerebrum) (HCC)   Stroke (HCC)   Acute respiratory failure with hypoxia (HCC)   Acute embolic CVA S/p revascularization with tPA and mechanical thrombectomy. Transition to eliquis in 1 to 2 days.  Therapy evaluations recommending SNF.  Hemoglobin A1c 10.5 LDL is 61, at goal. continue with lipitor.   PEA arrest Following mechanical thrombectomy S/p CPR intubated and extubated on 05/13/2020.   Acute on chronic systolic heart failure leading to cardiogenic shock Echocardiogram showed left ventricular ejection fraction less than 15% with severe biventricular failure. Initially started on milrinone and diuretics and transition. Currently she appears to be compensated. Continue with spironolactone and digoxin.    Atrial fibrillation with RVR/atrial tachycardia Rate controlled with amiodarone transition to oral amiodarone 200 mg twice daily S/p TEE DCCV on 05/19/2020 converted to normal sinus rhythm. Continue with IV heparin and possible transition to Eliquis 5 mg twice daily    Uncontrolled diabetes with  hyperglycemia, insulin-dependent Last A1c is 10.5 CBG (last 3)  Recent Labs    05/20/20 0003 05/20/20 0342 05/20/20 0801  GLUCAP 144* 139* 93   Continue with Lantus 18 units and sliding scale insulin.   Bipolar disorder Recommend outpatient follow-up with psychiatry.    Hypokalemia & Hypomagnesemia  Replaced   Anemia of chronic disease:  Baseline hemoglobin appear to be around 11. Currently at 10. Continue to monitor.   Hyponatremia:   probably from over diuresis.  Holding the lasix. Continue to monitor.      DVT prophylaxis:HEPARIN.  Code Status: FULL CODE.  Family Communication:none at bedside.  Disposition:   Status is: Inpatient  Remains inpatient appropriate because:Ongoing diagnostic testing needed not appropriate for outpatient work up, IV treatments appropriate due to intensity of illness or inability to take PO and Inpatient level of care appropriate due to severity of illness   Dispo: The patient is from: Home              Anticipated d/c is to: pending              Patient currently is not medically stable to d/c.   Difficult to place patient No       Consultants:   PCCM,   Heart failure team.   Palliative care.    Procedures:   Antimicrobials:  Antibiotics Given (last 72 hours)    None          Subjective: No new complaints.   Objective: Vitals:   05/20/20 0800 05/20/20 0803 05/20/20 0830 05/20/20 0900  BP: 135/83  130/77 138/77  Pulse: 89  91 88  Resp: 16  20 17   Temp:  98.3 F (36.8 C)    TempSrc:  Oral    SpO2: 100%  99% 100%  Weight:      Height:        Intake/Output Summary (Last 24 hours) at 05/20/2020 1026 Last data filed at 05/20/2020 0000 Gross per 24 hour  Intake 313.11 ml  Output 700 ml  Net -386.89 ml   Filed Weights   05/19/20 0412 05/19/20 1300 05/20/20 0500  Weight: 85.3 kg 85.3 kg 81.9 kg    Examination:  General exam: Appears calm and comfortable  Respiratory system: Clear to  auscultation. Respiratory effort normal on 2l it of [place] oxygen.  Cardiovascular system: S1 & S2 heard, RRR. No JVD,  Gastrointestinal system: Abdomen is nondistended, soft and nontender.Normal bowel sounds heard. Central nervous system: Alert and oriented and answering all questions appropriately.  Extremities: Symmetric 5 x 5 power. Skin: No rashes, lesions or ulcers Psychiatry:  Mood & affect appropriate.     Data Reviewed: I have personally reviewed following labs and imaging studies  CBC: Recent Labs  Lab 05/14/20 0507 05/15/20 0335 05/16/20 0319 05/17/20 0301 05/18/20 0416 05/19/20 0956 05/20/20 0441  WBC 18.4*   < > 10.2 8.6 8.8 10.0 13.3*  NEUTROABS 15.5*  --   --   --   --   --   --   HGB 11.2*   < > 10.5* 11.3* 12.1 11.1* 10.0*  HCT 33.6*   < > 32.5* 34.4* 36.5 33.4* 29.4*  MCV 75.8*   < > 77.6* 76.1* 74.9* 74.9* 74.8*  PLT 203   < > 225 229 271 264 263   < > = values in this interval not displayed.    Basic Metabolic Panel: Recent Labs  Lab 05/16/20 0319 05/17/20 0301 05/18/20 0416 05/19/20 0255 05/20/20 0441  NA 129* 128* 125* 124* 123*  K 4.0 3.5 3.8 3.9 4.0  CL 92* 80* 72* 70* 75*  CO2 30 39* 43* 45* 40*  GLUCOSE 163* 100* 138* 116* 126*  BUN 20 15 14 17 17   CREATININE 1.32* 1.23* 1.45* 1.51* 1.40*  CALCIUM 8.0* 8.9 9.3 9.1 8.6*  MG 1.5* 1.8  --  1.5* 2.0    GFR: Estimated Creatinine Clearance: 39 mL/min (A) (by C-G formula based on SCr of 1.4 mg/dL (H)).  Liver Function Tests: Recent Labs  Lab 05/14/20 0507  AST 109*  ALT 87*  ALKPHOS 133*  BILITOT 0.9  PROT 5.1*  ALBUMIN 2.1*    CBG: Recent Labs  Lab 05/19/20 1533 05/19/20 1934 05/20/20 0003 05/20/20 0342 05/20/20 0801  GLUCAP 128* 193* 144* 139* 93     Recent Results (from the past 240 hour(s))  Resp Panel by RT-PCR (Flu A&B, Covid) Nasopharyngeal Swab     Status: None   Collection Time: 05/12/20  1:16 PM   Specimen: Nasopharyngeal Swab; Nasopharyngeal(NP) swabs in vial  transport medium  Result Value Ref Range Status   SARS Coronavirus 2 by RT PCR NEGATIVE NEGATIVE Final    Comment: (NOTE) SARS-CoV-2 target nucleic acids are NOT DETECTED.  The SARS-CoV-2 RNA is generally detectable in upper respiratory specimens during the acute phase of infection. The lowest concentration of SARS-CoV-2 viral copies this assay can detect is 138 copies/mL. A negative result does not preclude SARS-Cov-2 infection and should not be used as the sole basis for treatment or other patient management decisions. A negative result may occur with  improper specimen collection/handling, submission of specimen other than nasopharyngeal swab, presence of viral mutation(s) within the areas targeted by this assay, and inadequate number  of viral copies(<138 copies/mL). A negative result must be combined with clinical observations, patient history, and epidemiological information. The expected result is Negative.  Fact Sheet for Patients:  BloggerCourse.com  Fact Sheet for Healthcare Providers:  SeriousBroker.it  This test is no t yet approved or cleared by the [place] FDA and  has been authorized for detection and/or diagnosis of SARS-CoV-2 by FDA under an Emergency Use Authorization (EUA). This EUA will remain  in effect (meaning this test can be used) for the duration of the COVID-19 declaration under Section 564(b)(1) of the Act, 21 U.S.C.section 360bbb-3(b)(1), unless the authorization is terminated  or revoked sooner.       Influenza A by PCR NEGATIVE NEGATIVE Final   Influenza B by PCR NEGATIVE NEGATIVE Final    Comment: (NOTE) The Xpert Xpress SARS-CoV-2/FLU/RSV plus assay is intended as an aid in the diagnosis of influenza from Nasopharyngeal swab specimens and should not be used as a sole basis for treatment. Nasal washings and aspirates are unacceptable for Xpert Xpress SARS-CoV-2/FLU/RSV testing.  Fact  Sheet for Patients: BloggerCourse.com  Fact Sheet for Healthcare Providers: SeriousBroker.it  This test is not yet approved or cleared by the [place] FDA and has been authorized for detection and/or diagnosis of SARS-CoV-2 by FDA under an Emergency Use Authorization (EUA). This EUA will remain in effect (meaning this test can be used) for the duration of the COVID-19 declaration under Section 564(b)(1) of the Act, 21 U.S.C. section 360bbb-3(b)(1), unless the authorization is terminated or revoked.  Performed at [facility], [address] [place], [place] [address]   MRSA PCR Screening     Status: None   Collection Time: 05/12/20  4:50 PM   Specimen: Nasal Mucosa; Nasopharyngeal  Result Value Ref Range Status   MRSA by PCR NEGATIVE NEGATIVE Final    Comment:        The GeneXpert MRSA Assay (FDA approved for NASAL specimens only), is one component of a comprehensive MRSA colonization surveillance program. It is not intended to diagnose MRSA infection nor to guide or monitor treatment for MRSA infections. Performed at [facility], [address], [place], [place] [address]   Culture, Urine     Status: None   Collection Time: 05/14/20  9:12 AM   Specimen: Urine, Random  Result Value Ref Range Status   Specimen Description URINE, RANDOM  Final   Special Requests NONE  Final   Culture   Final    NO GROWTH Performed at [facility], [address] [place], [place] [address]    Report Status 05/15/2020 FINAL  Final         Radiology Studies: No results found.      Scheduled Meds: . amiodarone  200 mg Oral BID  . atorvastatin  80 mg Oral Daily  . Chlorhexidine Gluconate Cloth  6 each Topical Daily  . digoxin  0.125 mg Oral Daily  . famotidine  20 mg Oral Q2000  . feeding supplement  237 mL Oral BID BM  . insulin aspart  0-15 Units Subcutaneous Q4H  . insulin glargine  18 Units  Subcutaneous QHS  . lidocaine  1 patch Transdermal Q24H  . pantoprazole  40 mg Oral Daily  . potassium chloride  40 mEq Oral Daily  . spironolactone  25 mg Oral Daily   Continuous Infusions: . sodium chloride Stopped (05/16/20 0600)  . heparin 1,050 Units/hr (05/20/20 0000)     LOS: 8 days  Kathlen Mody, MD [facility]   To contact the attending provider between 7A-7P or the covering provider during after hours 7P-7A, please log into the web site [email or web address] and access using universal [place] password for that web site. If you do not have the password, please call the hospital operator.  05/20/2020, 10:26 AM

## 2020-05-20 NOTE — Progress Notes (Signed)
Spoke with RN Shanda Bumps who is aware of the DC central line removal order.

## 2020-05-20 NOTE — Progress Notes (Addendum)
Patient ID: [name] [name], female   DOB: 1950-10-28, 70 y.o.   MRN: [id number]     Advanced Heart Failure Rounding Note  PCP-Cardiologist: Kardie Tobb, DO   Subjective:    5/5 Milrinone increased to 0.25 mcg. Off norepi. 5/8 Lasix drip stopped. Continue on milrinone 0.125 mcg.  5/9 S/P TEE/DC-CV -->NSR.   CO-OX 74%.   Complaining of right shoulder pain and dizziness. Says she is allergic to metoprolol. Stopped last night.    Objective:   Weight Range: 81.9 kg Body mass index is 31.98 kg/m.   Vital Signs:   Temp:  [97.5 F (36.4 C)-98.5 F (36.9 C)] 98 F (36.7 C) (05/10 0347) Pulse Rate:  [54-117] 87 (05/10 0600) Resp:  [10-27] 14 (05/10 0600) BP: (105-167)/(54-150) 132/67 (05/10 0600) SpO2:  [92 %-100 %] 100 % (05/10 0600) Weight:  [81.9 kg-85.3 kg] 81.9 kg (05/10 0500) Last BM Date:  (pta)  Weight change: Filed Weights   05/19/20 0412 05/19/20 1300 05/20/20 0500  Weight: 85.3 kg 85.3 kg 81.9 kg    Intake/Output:   Intake/Output Summary (Last 24 hours) at 05/20/2020 0744 Last data filed at 05/20/2020 0000 Gross per 24 hour  Intake 450.99 ml  Output 850 ml  Net -399.01 ml      Physical Exam  General:   No resp difficulty HEENT: normal Neck: supple. no JVD. Carotids 2+ bilat; no bruits. No lymphadenopathy or thryomegaly appreciated. RIJ  Cor: PMI nondisplaced. Regular rate & rhythm. No rubs, gallops or murmurs. Lungs: clear Abdomen: soft, nontender, nondistended. No hepatosplenomegaly. No bruits or masses. Good bowel sounds. Extremities: no cyanosis, clubbing, rash, R and LLE unna boots. Neuro: alert & orientedx3, cranial nerves grossly intact. moves all 4 extremities w/o difficulty. Affect pleasant    Telemetry  SR 90s personally reviewed.    Labs    CBC Recent Labs    05/19/20 0956 05/20/20 0441  WBC 10.0 13.3*  HGB 11.1* 10.0*  HCT 33.4* 29.4*  MCV 74.9* 74.8*  PLT 264 263   Basic Metabolic Panel Recent Labs    62/26/33 0255  05/20/20 0441  NA 124* 123*  K 3.9 4.0  CL 70* 75*  CO2 45* 40*  GLUCOSE 116* 126*  BUN 17 17  CREATININE 1.51* 1.40*  CALCIUM 9.1 8.6*  MG 1.5* 2.0   Liver Function Tests No results for input(s): AST, ALT, ALKPHOS, BILITOT, PROT, ALBUMIN in the last 72 hours. No results for input(s): LIPASE, AMYLASE in the last 72 hours. Cardiac Enzymes No results for input(s): CKTOTAL, CKMB, CKMBINDEX, TROPONINI in the last 72 hours.  BNP: BNP (last 3 results) Recent Labs    03/03/20 1559  BNP 1,981.9*    ProBNP (last 3 results) No results for input(s): PROBNP in the last 8760 hours.   D-Dimer No results for input(s): DDIMER in the last 72 hours. Hemoglobin A1C No results for input(s): HGBA1C in the last 72 hours. Fasting Lipid Panel No results for input(s): CHOL, HDL, LDLCALC, TRIG, CHOLHDL, LDLDIRECT in the last 72 hours. Thyroid Function Tests No results for input(s): TSH, T4TOTAL, T3FREE, THYROIDAB in the last 72 hours.  Invalid input(s): FREET3  Other results:   Imaging    No results found.   Medications:     Scheduled Medications: . amiodarone  200 mg Oral BID  . atorvastatin  80 mg Oral Daily  . Chlorhexidine Gluconate Cloth  6 each Topical Daily  . digoxin  0.125 mg Oral Daily  . famotidine  20 mg Oral  Q2000  . feeding supplement  237 mL Oral BID BM  . insulin aspart  0-15 Units Subcutaneous Q4H  . insulin glargine  18 Units Subcutaneous QHS  . lidocaine  1 patch Transdermal Q24H  . pantoprazole  40 mg Oral Daily  . potassium chloride  40 mEq Oral Daily  . spironolactone  25 mg Oral Daily    Infusions: . sodium chloride Stopped (05/16/20 0600)  . heparin 1,050 Units/hr (05/20/20 0000)    PRN Medications: sodium chloride, ondansetron (ZOFRAN) IV, oxyCODONE, simethicone    Assessment/Plan   1. Acute on chronic systolic HF ->  Cardiogenic shock - EF < 15% with severe biventricular failure - almost certainly due to tachy-induced (AF)  cardiomyopathy - - CO-OX 74% off milrinone.  - Volume status stable. Check orthostatic pressures.  - off metoprolol -- patient says she is allergic though she was on this prior to admit.  -  Continue unna boots.  - continue digoxin and spironolactone.   2. AF with RVR/atrial tach - amiodarone for rate/rhythm control.  - continue heparin gtt today. Start eliquis 5 mg twice a day.  -  AT =>  atrial tachy with 2:1 block.   - 5/9 S/P TEE DC-CV -->NSR.  - Maintaining NSR. Continue amio 200 mg twice a day.   3. Acute embolic CVA  - s/p successful revascularization with systemic T-PA and mechanical thrombectomy - no clinical residual and no significant defect on MRI - Continue heparin gtt  - Continue PT/OT  4. Non-compliance  5. Bipolar d/o - will need outpatient f/u w/ mental health   6. Poorly controlled DM2 - hgb A1c 10.5  - insulin per primary team   7. PEA arrest - Had brief CPR and intubated. - On 2 liters [place].   8. Moderate Pericardial Effusion w/o Tamponade  - Hold diuretics.  - monitor hemodynamics  - Eventual repeat echo.   9. Hypokalemia - K stable   10. Leukocytosis  - WBC 13>>18>>14K> 10 but AF. ? Reactive  - CXR 5/3 w/ diffuse infiltrates/ edema - PCT elevated 2.75 but in setting of renal insufficiency  - UA negative   11. Iron-deficient Anemia - Hgb stable 10.5 -> 11.3 -> 12.1->13.3 - got feraheme 5/6  12. Hyponatremia  Sodium 123. Restrict free water.   13. Hypomagnesia Mag up to 2 after replacement.     Can take out central line. Plan to start eliquis 5 mg twice a day on 05/21/20.  Check orthostatics. Needs PT/OT.    Length of Stay: 8  [name], NP  05/20/2020, 7:44 AM  Advanced Heart Failure Team Pager [phone] (M-F; 7a - 5p)  Please contact [facility] for night-coverage after hours (5p -7a ) and weekends on [email or web address]  Patient seen and examined with the above-signed Advanced Practice Provider and/or Housestaff. I personally reviewed  laboratory data, imaging studies and relevant notes. I independently examined the patient and formulated the important aspects of the plan. I have edited the note to reflect any of my changes or salient points. I have personally discussed the plan with the patient and/or family.  Underwent TEE/DC-CV yesterday. Now in NSR. Feels ok but shoulder sore. No orthopnea or PND. Co-ox 74%   General:  Well appearing. No resp difficulty HEENT: normal Neck: supple. no JVD. Carotids 2+ bilat; no bruits. No lymphadenopathy or thryomegaly appreciated. Cor: PMI nondisplaced. Regular rate & rhythm. No rubs, gallops or murmurs. Lungs: clear Abdomen: soft, nontender, nondistended. No hepatosplenomegaly. No bruits or masses.  Good bowel sounds. Extremities: no cyanosis, clubbing, rash, edema Neuro: alert & orientedx3, cranial nerves grossly intact. moves all 4 extremities w/o difficulty. Affect pleasant   Volume status much improved. Now back in NSR. Co-ox stable. Continue spiro and digoxin. Add losartan 25. Weight down. Can hold diuretics today. Would give tolvaptan for NA 123. Continue po amio. Switch heparin to Eliquis tomorrow. Needs aggressive PT/OT.   Arvilla Meres, MD  2:12 PM

## 2020-05-21 ENCOUNTER — Encounter (HOSPITAL_COMMUNITY): Payer: Self-pay | Admitting: Internal Medicine

## 2020-05-21 DIAGNOSIS — I5021 Acute systolic (congestive) heart failure: Secondary | ICD-10-CM | POA: Diagnosis not present

## 2020-05-21 DIAGNOSIS — E871 Hypo-osmolality and hyponatremia: Secondary | ICD-10-CM | POA: Diagnosis not present

## 2020-05-21 DIAGNOSIS — I5041 Acute combined systolic (congestive) and diastolic (congestive) heart failure: Secondary | ICD-10-CM

## 2020-05-21 DIAGNOSIS — Z7189 Other specified counseling: Secondary | ICD-10-CM | POA: Diagnosis not present

## 2020-05-21 DIAGNOSIS — Z515 Encounter for palliative care: Secondary | ICD-10-CM | POA: Diagnosis not present

## 2020-05-21 LAB — HEPARIN LEVEL (UNFRACTIONATED): Heparin Unfractionated: 0.33 IU/mL (ref 0.30–0.70)

## 2020-05-21 LAB — GLUCOSE, CAPILLARY
Glucose-Capillary: 116 mg/dL — ABNORMAL HIGH (ref 70–99)
Glucose-Capillary: 133 mg/dL — ABNORMAL HIGH (ref 70–99)
Glucose-Capillary: 136 mg/dL — ABNORMAL HIGH (ref 70–99)
Glucose-Capillary: 155 mg/dL — ABNORMAL HIGH (ref 70–99)
Glucose-Capillary: 159 mg/dL — ABNORMAL HIGH (ref 70–99)
Glucose-Capillary: 176 mg/dL — ABNORMAL HIGH (ref 70–99)
Glucose-Capillary: 225 mg/dL — ABNORMAL HIGH (ref 70–99)

## 2020-05-21 LAB — BASIC METABOLIC PANEL
Anion gap: 10 (ref 5–15)
BUN: 19 mg/dL (ref 8–23)
CO2: 36 mmol/L — ABNORMAL HIGH (ref 22–32)
Calcium: 9 mg/dL (ref 8.9–10.3)
Chloride: 78 mmol/L — ABNORMAL LOW (ref 98–111)
Creatinine, Ser: 1.43 mg/dL — ABNORMAL HIGH (ref 0.44–1.00)
GFR, Estimated: 40 mL/min — ABNORMAL LOW (ref >60)
Glucose, Bld: 168 mg/dL — ABNORMAL HIGH (ref 70–99)
Potassium: 5 mmol/L (ref 3.5–5.1)
Sodium: 124 mmol/L — ABNORMAL LOW (ref 135–145)

## 2020-05-21 LAB — CBC
HCT: 29.2 % — ABNORMAL LOW (ref 36.0–46.0)
Hemoglobin: 9.6 g/dL — ABNORMAL LOW (ref 12.0–15.0)
MCH: 25.3 pg — ABNORMAL LOW (ref 26.0–34.0)
MCHC: 32.9 g/dL (ref 30.0–36.0)
MCV: 77 fL — ABNORMAL LOW (ref 80.0–100.0)
Platelets: 258 10*3/uL (ref 150–400)
RBC: 3.79 MIL/uL — ABNORMAL LOW (ref 3.87–5.11)
RDW: 15 % (ref 11.5–15.5)
WBC: 18.1 10*3/uL — ABNORMAL HIGH (ref 4.0–10.5)
nRBC: 0.1 % (ref 0.0–0.2)

## 2020-05-21 LAB — SODIUM: Sodium: 122 mmol/L — ABNORMAL LOW (ref 135–145)

## 2020-05-21 MED ORDER — APIXABAN 5 MG PO TABS
5.0000 mg | ORAL_TABLET | Freq: Two times a day (BID) | ORAL | Status: DC
  Administered 2020-05-21 – 2020-05-26 (×12): 5 mg via ORAL
  Filled 2020-05-21 (×12): qty 1

## 2020-05-21 NOTE — Progress Notes (Signed)
[facility] PROGRESS NOTE   [name] [name] RZN:[id number] DOB: 12-Jan-1951 DOA: 05/12/2020  PCP: Dana Allan, MD  Brief History/Interval Summary: 70 year old lady with prior history of atrial fibrillation, chronic systolic heart failure with left ventricular ejection fraction of 25 to 30%, hypertension, hyperlipidemia, history of COVID-19 infection in the past  presented with left-sided weakness, required tPA and mechanical thrombectomy, had a cardiac arrest/PEA arrest immediately following her thrombectomy, underwent CPR for less than 5 minutes, intubated, started on pressors transferred to [facility] service.  Patient was extubated on 05/13/2020 and was weaned off vasopressors.  Patient also underwent TEE/DCCV on 05/19/20. She was transferred to [facility] on 05/20/2020.  Consultants:  Critical care medicine Heart failure team Palliative care  Procedures:  TEE/DC cardioversion on 5/9  Antibiotics: Anti-infectives (From admission, onward)   None      Subjective/Interval History: Patient denies any chest pain.  Occasional shortness of breath.  No nausea or vomiting.  Complains of pain in her back which has been ongoing for several days.     Assessment/Plan:  Acute embolic CVA She is status post revascularization with tPA and mechanical thrombectomy.  Seen by neurology.  Seen by PT and OT.  Plan is for skilled nursing facility when medically stable. LDL 61.  HbA1c 10.0.  Noted to be on statin.  Currently on IV heparin.  Plan is for transition to p.o. today per cardiology.  Acute on chronic systolic CHF/cardiogenic shock/PEA arrest Patient went into cardiac arrest after she underwent tPA and mechanical thrombectomy for acute stroke.  Heart failure team is following.  Patient was on milrinone.  He was also getting diuretics.  Management per heart failure team.  Noted to be on digoxin, spironolactone.  Currently off of furosemide. Strict ins and outs and daily weights  Hyponatremia Given  tolvaptan yesterday.  Monitor sodium levels closely.  Atrial fibrillation with RVR Noted to be on amiodarone.  On heparin.  Plan is to start Eliquis.  Diabetes mellitus type 2, uncontrolled with hyperglycemia HbA1c 10.5.  Patient currently on Lantus insulin and SSI.  CBGs are reasonably well controlled.  Leukocytosis Rise in WBC is noted.  Reason is unclear.  She is noted to be afebrile.  Could be reactive.  Check UA.  Recheck labs tomorrow.  Hypokalemia and hypomagnesemia This was repleted.  Anemia of chronic disease No evidence of overt bleeding.  Monitor closely.  Bipolar disorder Outpatient follow-up.  Obesity Estimated body mass index is 33 kg/m as calculated from the following:   Height as of this encounter: 5\' 3"  (1.6 m).   Weight as of this encounter: 84.5 kg.   DVT Prophylaxis: On IV heparin Code Status: Full code Family Communication: Discussed with the patient.  No family at bedside Disposition Plan: SNF when medically stable  Status is: Inpatient  Remains inpatient appropriate because:IV treatments appropriate due to intensity of illness or inability to take PO   Dispo: The patient is from: Home              Anticipated d/c is to: SNF              Patient currently is not medically stable to d/c.   Difficult to place patient No         Medications:  Scheduled: . amiodarone  200 mg Oral BID  . atorvastatin  80 mg Oral Daily  . Chlorhexidine Gluconate Cloth  6 each Topical Daily  . digoxin  0.125 mg Oral Daily  . famotidine  20  mg Oral Q2000  . feeding supplement  237 mL Oral BID BM  . insulin aspart  0-15 Units Subcutaneous Q4H  . insulin glargine  18 Units Subcutaneous QHS  . lidocaine  1 patch Transdermal Q24H  . pantoprazole  40 mg Oral Daily  . polyethylene glycol  17 g Oral Daily  . senna-docusate  2 tablet Oral BID  . spironolactone  25 mg Oral Daily   Continuous: . sodium chloride Stopped (05/16/20 0600)  . heparin 1,050 Units/hr  (05/21/20 0158)   XNA:TFTDDU chloride, bisacodyl, ondansetron (ZOFRAN) IV, oxyCODONE, simethicone   Objective:  Vital Signs  Vitals:   05/20/20 2023 05/21/20 0018 05/21/20 0412 05/21/20 0744  BP: 134/83 (!) 136/96 118/65 139/80  Pulse: 91 97 96 99  Resp: 20 18 (!) 21 (!) 23  Temp: 98.2 F (36.8 C) 97.8 F (36.6 C) 97.9 F (36.6 C) 98 F (36.7 C)  TempSrc: Oral Axillary Oral Oral  SpO2: 100%  100%   Weight:  84.5 kg    Height:        Intake/Output Summary (Last 24 hours) at 05/21/2020 1035 Last data filed at 05/21/2020 0910 Gross per 24 hour  Intake 593.33 ml  Output 1000 ml  Net -406.67 ml   Filed Weights   05/19/20 1300 05/20/20 0500 05/21/20 0018  Weight: 85.3 kg 81.9 kg 84.5 kg    General appearance: Awake alert.  In no distress Resp: Clear to auscultation bilaterally.  Normal effort Cardio: S1-S2 is normal regular.  No S3-S4.  No rubs murmurs or bruit GI: Abdomen is soft.  Nontender nondistended.  Bowel sounds are present normal.  No masses organomegaly Extremities: No edema.   Neurologic: Alert and oriented x3.  Mild left-sided deficits.   Lab Results:  Data Reviewed: I have personally reviewed following labs and imaging studies  CBC: Recent Labs  Lab 05/17/20 0301 05/18/20 0416 05/19/20 0956 05/20/20 0441 05/21/20 0501  WBC 8.6 8.8 10.0 13.3* 18.1*  HGB 11.3* 12.1 11.1* 10.0* 9.6*  HCT 34.4* 36.5 33.4* 29.4* 29.2*  MCV 76.1* 74.9* 74.9* 74.8* 77.0*  PLT 229 271 264 263 258    Basic Metabolic Panel: Recent Labs  Lab 05/16/20 0319 05/17/20 0301 05/18/20 0416 05/19/20 0255 05/20/20 0441 05/20/20 2307 05/21/20 0501  NA 129* 128* 125* 124* 123* 122* 124*  K 4.0 3.5 3.8 3.9 4.0  --  5.0  CL 92* 80* 72* 70* 75*  --  78*  CO2 30 39* 43* 45* 40*  --  36*  GLUCOSE 163* 100* 138* 116* 126*  --  168*  BUN 20 15 14 17 17   --  19  CREATININE 1.32* 1.23* 1.45* 1.51* 1.40*  --  1.43*  CALCIUM 8.0* 8.9 9.3 9.1 8.6*  --  9.0  MG 1.5* 1.8  --  1.5*  2.0  --   --     GFR: Estimated Creatinine Clearance: 38.8 mL/min (A) (by C-G formula based on SCr of 1.43 mg/dL (H)).  Liver Function Tests: Recent Labs  Lab 05/20/20 0441  AST 37  ALT 37  ALKPHOS 194*  BILITOT 1.3*  PROT 6.1*  ALBUMIN 2.1*     Coagulation Profile: Recent Labs  Lab 05/19/20 1045  INR 1.3*    CBG: Recent Labs  Lab 05/20/20 1521 05/20/20 2022 05/21/20 0017 05/21/20 0411 05/21/20 0746  GLUCAP 194* 280* 116* 159* 136*      Recent Results (from the past 240 hour(s))  Resp Panel by RT-PCR (Flu A&B, Covid) Nasopharyngeal  Swab     Status: None   Collection Time: 05/12/20  1:16 PM   Specimen: Nasopharyngeal Swab; Nasopharyngeal(NP) swabs in vial transport medium  Result Value Ref Range Status   SARS Coronavirus 2 by RT PCR NEGATIVE NEGATIVE Final    Comment: (NOTE) SARS-CoV-2 target nucleic acids are NOT DETECTED.  The SARS-CoV-2 RNA is generally detectable in upper respiratory specimens during the acute phase of infection. The lowest concentration of SARS-CoV-2 viral copies this assay can detect is 138 copies/mL. A negative result does not preclude SARS-Cov-2 infection and should not be used as the sole basis for treatment or other patient management decisions. A negative result may occur with  improper specimen collection/handling, submission of specimen other than nasopharyngeal swab, presence of viral mutation(s) within the areas targeted by this assay, and inadequate number of viral copies(<138 copies/mL). A negative result must be combined with clinical observations, patient history, and epidemiological information. The expected result is Negative.  Fact Sheet for Patients:  BloggerCourse.com  Fact Sheet for Healthcare Providers:  SeriousBroker.it  This test is no t yet approved or cleared by the [place] FDA and  has been authorized for detection and/or diagnosis of SARS-CoV-2  by FDA under an Emergency Use Authorization (EUA). This EUA will remain  in effect (meaning this test can be used) for the duration of the COVID-19 declaration under Section 564(b)(1) of the Act, 21 U.S.C.section 360bbb-3(b)(1), unless the authorization is terminated  or revoked sooner.       Influenza A by PCR NEGATIVE NEGATIVE Final   Influenza B by PCR NEGATIVE NEGATIVE Final    Comment: (NOTE) The Xpert Xpress SARS-CoV-2/FLU/RSV plus assay is intended as an aid in the diagnosis of influenza from Nasopharyngeal swab specimens and should not be used as a sole basis for treatment. Nasal washings and aspirates are unacceptable for Xpert Xpress SARS-CoV-2/FLU/RSV testing.  Fact Sheet for Patients: BloggerCourse.com  Fact Sheet for Healthcare Providers: SeriousBroker.it  This test is not yet approved or cleared by the [place] FDA and has been authorized for detection and/or diagnosis of SARS-CoV-2 by FDA under an Emergency Use Authorization (EUA). This EUA will remain in effect (meaning this test can be used) for the duration of the COVID-19 declaration under Section 564(b)(1) of the Act, 21 U.S.C. section 360bbb-3(b)(1), unless the authorization is terminated or revoked.  Performed at [facility], [address], [place], [place] [address]   MRSA PCR Screening     Status: None   Collection Time: 05/12/20  4:50 PM   Specimen: Nasal Mucosa; Nasopharyngeal  Result Value Ref Range Status   MRSA by PCR NEGATIVE NEGATIVE Final    Comment:        The GeneXpert MRSA Assay (FDA approved for NASAL specimens only), is one component of a comprehensive MRSA colonization surveillance program. It is not intended to diagnose MRSA infection nor to guide or monitor treatment for MRSA infections. Performed at [facility], [address], [place], [place] [address]   Culture, Urine     Status: None   Collection Time:  05/14/20  9:12 AM   Specimen: Urine, Random  Result Value Ref Range Status   Specimen Description URINE, RANDOM  Final   Special Requests NONE  Final   Culture   Final    NO GROWTH Performed at [facility], [address], [place], [place] [address]    Report Status 05/15/2020 FINAL  Final      Radiology  Studies: No results found.     LOS: 9 days   Eve Rey [employer] on [email or web address]  05/21/2020, 10:35 AM

## 2020-05-21 NOTE — Progress Notes (Signed)
RE: [name] [name] Date of Birth: 09-20-1950  Date: 05/21/2020  Please be advised that the above-named patient will require a short-term nursing home stay - anticipated 30 days or less for rehabilitation and strengthening.  The plan is for return home.

## 2020-05-21 NOTE — Progress Notes (Addendum)
Patient ID: [name] [name], female   DOB: 1950-10-03, 70 y.o.   MRN: [id number]     Advanced Heart Failure Rounding Note  PCP-Cardiologist: Kardie Tobb, DO   Subjective:    5/5 Milrinone increased to 0.25 mcg. Off norepi. 5/8 Lasix drip stopped. Continue on milrinone 0.125 mcg.  5/9 S/P TEE/DC-CV -->NSR.  5/10 switched to po amio . Given tolvaptan. Orthostatic with PT.   Feeling a little better today. Complaining of right shoulder pain.    Objective:   Weight Range: 84.5 kg Body mass index is 33 kg/m.   Vital Signs:   Temp:  [97.8 F (36.6 C)-98.2 F (36.8 C)] 98 F (36.7 C) (05/11 0744) Pulse Rate:  [82-99] 99 (05/11 0744) Resp:  [14-24] 23 (05/11 0744) BP: (95-163)/(64-111) 139/80 (05/11 0744) SpO2:  [91 %-100 %] 100 % (05/11 0412) Weight:  [84.5 kg] 84.5 kg (05/11 0018) Last BM Date:  (no BM yet, Miralax and Senna Given)  Weight change: Filed Weights   05/19/20 1300 05/20/20 0500 05/21/20 0018  Weight: 85.3 kg 81.9 kg 84.5 kg    Intake/Output:   Intake/Output Summary (Last 24 hours) at 05/21/2020 1017 Last data filed at 05/21/2020 0910 Gross per 24 hour  Intake 593.33 ml  Output 1000 ml  Net -406.67 ml      Physical Exam  General:  Well appearing. No resp difficulty HEENT: normal Neck: supple. no JVD. Carotids 2+ bilat; no bruits. No lymphadenopathy or thryomegaly appreciated. Cor: PMI nondisplaced. Regular rate & rhythm. No rubs, gallops or murmurs. Lungs: clear Abdomen: soft, nontender, nondistended. No hepatosplenomegaly. No bruits or masses. Good bowel sounds. Extremities: no cyanosis, clubbing, rash, R and LLE unna boots. edema Neuro: alert & orientedx3, cranial nerves grossly intact. moves all 4 extremities w/o difficulty. Affect pleasant    Telemetry  SR 90s personally checked.    Labs    CBC Recent Labs    05/20/20 0441 05/21/20 0501  WBC 13.3* 18.1*  HGB 10.0* 9.6*  HCT 29.4* 29.2*  MCV 74.8* 77.0*  PLT 263 258   Basic  Metabolic Panel Recent Labs    76/16/07 0255 05/20/20 0441 05/20/20 2307 05/21/20 0501  NA 124* 123* 122* 124*  K 3.9 4.0  --  5.0  CL 70* 75*  --  78*  CO2 45* 40*  --  36*  GLUCOSE 116* 126*  --  168*  BUN 17 17  --  19  CREATININE 1.51* 1.40*  --  1.43*  CALCIUM 9.1 8.6*  --  9.0  MG 1.5* 2.0  --   --    Liver Function Tests Recent Labs    05/20/20 0441  AST 37  ALT 37  ALKPHOS 194*  BILITOT 1.3*  PROT 6.1*  ALBUMIN 2.1*   No results for input(s): LIPASE, AMYLASE in the last 72 hours. Cardiac Enzymes No results for input(s): CKTOTAL, CKMB, CKMBINDEX, TROPONINI in the last 72 hours.  BNP: BNP (last 3 results) Recent Labs    03/03/20 1559  BNP 1,981.9*    ProBNP (last 3 results) No results for input(s): PROBNP in the last 8760 hours.   D-Dimer No results for input(s): DDIMER in the last 72 hours. Hemoglobin A1C No results for input(s): HGBA1C in the last 72 hours. Fasting Lipid Panel No results for input(s): CHOL, HDL, LDLCALC, TRIG, CHOLHDL, LDLDIRECT in the last 72 hours. Thyroid Function Tests No results for input(s): TSH, T4TOTAL, T3FREE, THYROIDAB in the last 72 hours.  Invalid input(s): FREET3  Other  results:   Imaging    No results found.   Medications:     Scheduled Medications: . amiodarone  200 mg Oral BID  . atorvastatin  80 mg Oral Daily  . Chlorhexidine Gluconate Cloth  6 each Topical Daily  . digoxin  0.125 mg Oral Daily  . famotidine  20 mg Oral Q2000  . feeding supplement  237 mL Oral BID BM  . insulin aspart  0-15 Units Subcutaneous Q4H  . insulin glargine  18 Units Subcutaneous QHS  . lidocaine  1 patch Transdermal Q24H  . pantoprazole  40 mg Oral Daily  . polyethylene glycol  17 g Oral Daily  . potassium chloride  40 mEq Oral Daily  . senna-docusate  2 tablet Oral BID  . spironolactone  25 mg Oral Daily    Infusions: . sodium chloride Stopped (05/16/20 0600)  . heparin 1,050 Units/hr (05/21/20 0158)    PRN  Medications: sodium chloride, bisacodyl, ondansetron (ZOFRAN) IV, oxyCODONE, simethicone    Assessment/Plan   1. Acute on chronic systolic HF ->  Cardiogenic shock - EF < 15% with severe biventricular failure - almost certainly due to tachy-induced (AF) cardiomyopathy - -No central line. .  - Volume status stable. Hold diuretics. Orthostatic 5/10 with PT.  - off metoprolol -- patient says she is allergic though she was on this prior to admit.  - Hold off on entresto or farxiga with orthostasis.  - Stop K supp.  - continue digoxin  - Continue spironolactone.   2. AF with RVR/atrial tach - amiodarone for rate/rhythm control.  - Stop heparin ontinue heparin gtt today. Start eliquis 5 mg twice a day today. .  -  AT =>  atrial tachy with 2:1 block.   - 5/9 S/P TEE DC-CV -->NSR.  - Maintaining NSR. Continue amio 200 mg twice a day.   3. Acute embolic CVA  - s/p successful revascularization with systemic T-PA and mechanical thrombectomy - no clinical residual and no significant defect on MRI - Stop heparin drip. Start elquis today.   - Continue PT/OT--recommending SNF  4. Non-compliance  5. Bipolar d/o - will need outpatient f/u w/ mental health   6. Poorly controlled DM2 - hgb A1c 10.5  - insulin per primary team   7. PEA arrest - Had brief CPR and intubated. - On 2 liters [place].   8. Moderate Pericardial Effusion w/o Tamponade  - Eventual repeat echo.   9. Hypokalemia - K stable   10. Leukocytosis  Afebrile/  - WBC 13>>18>>14K> 10>>18 but AF. ? Reactive  - CXR 5/3 w/ diffuse infiltrates/ edema - PCT elevated 2.75 but in setting of renal insufficiency  - UA negative   11. Iron-deficient Anemia - Hgb stable 9.6 today  - got feraheme 5/6  12. Hyponatremia  -Given tolvaptan 5/10 Sodium 122-->124  - Restrict free water.   13. Hypomagnesia Mag up to 2 after replacement.    SW following for placement.   Length of Stay: 9  [name], NP  05/21/2020, 10:17  AM  Advanced Heart Failure Team Pager [phone] (M-F; 7a - 5p)  Please contact [facility] for night-coverage after hours (5p -7a ) and weekends on [email or web address]   Patient seen and examined with the above-signed Advanced Practice Provider and/or Housestaff. I personally reviewed laboratory data, imaging studies and relevant notes. I independently examined the patient and formulated the important aspects of the plan. I have edited the note to reflect any of my changes or salient points.  I have personally discussed the plan with the patient and/or family.  Feels better today. Denies SOB, orthopnea or PND. Remains in NSR. Has not walked yet  General:  Weak appearing. No resp difficulty HEENT: normal Neck: supple. no JVD. Carotids 2+ bilat; no bruits. No lymphadenopathy or thryomegaly appreciated. Cor: PMI nondisplaced. Regular rate & rhythm. No rubs, gallops or murmurs. Lungs: clear Abdomen: soft, nontender, nondistended. No hepatosplenomegaly. No bruits or masses. Good bowel sounds. Extremities: no cyanosis, clubbing, rash, edema Neuro: alert & orientedx3, cranial nerves grossly intact. moves all 4 extremities w/o difficulty. Affect pleasant  Remains in NSR. Continue po amio. Switch heparin to Eliquis. Add Entresto. Needs to ambulate.  Arvilla Meres, MD  5:30 PM

## 2020-05-21 NOTE — Progress Notes (Signed)
    Progress Note from the Palliative Medicine Team at [facility]   Patient Name: [name] [name]       Date: 05/21/2020 DOB: 03/27/1950  Age: 70 y.o. MRN#: [id number] Attending Physician: Osvaldo Shipper, MD Primary Care Physician: Dana Allan, MD Admit Date: 05/12/2020   Medical records reviewed   69 y.o. female  admitted on 05/12/2020 with  multiple medical problems presented with left sided weakness  Admitted with  Code Stroke with left sided weakness/facial droop. CTA shows vessel occlusion of the R ICA terminus, proximal M1 R MCA and proximal A1 R ACA with mild atherosclerotic disease within the carotid bifurcations and proximal ICAs,  required TPA and mechanical thrombectomy. Had a cardiac arrest immediately following her thrombectomy with CPR for <5 minutes.  Patient sent to IR for thrombectomy and was intubated for procedure. IR had succesful mechanical thrombectomy of the clot with complete vascularization. After procedure, patient became hypotensive and went into a PEA arrest. Chest compressions were started and patient receive a total of 2 epi. Patient stabilized. Central line placed and patient placed on pressors (Vaso, Epi, Neo). Patient transported to 4N ICU.  Extubated successfully. Progressing with medical support.      Alert and oriented, she remains  weak and high risk for decompensation secondary to multiple comorbidities.  This NP visited patient at the bedside as a follow up for palliative medicine needs and emotional support, no family at bedside.  Created space and opportunity for [name] [name] to explore her thoughts and feelings regarding her current medcall situation.  I worry that she does not grasp the severity of her illnesses.  She is talking about buying a house.   I encouraged her to contemplate best case scenario vs worst case scenario.    She is open to SNF for short term rehab when stable for discharge.   Spoke with daughter/[name] by telephone.   Education offered on the current medical situation.  Questions and concerns addressed.     Again detailed education regarding her multiple comorbidities and the seriousness of her current medical situation.  She is high risk for decompensation.   Daughter understands and she too worries that her mom is not grasping the gravity of her medical situation and anticipatory care needs into the future.     Patient  remains open to all offered and available medical interventions to prolong life.  Hope is for return to baseline.  Encouraged/education  patient to consider DNR/DNI status understanding evidenced based poor outcomes in similar hospitalized patient, as the cause of arrest is likely associated with advanced chronic illness rather than an easily reversible acute cardio-pulmonary event.   Discussed with patient the importance of continued conversation with her  family and the  medical providers regarding overall plan of care and treatment options,  ensuring decisions are within the context of the patients values and GOCs.   This nurse practitioner informed  the patient/family and the attending that I will be out of the hospital until Monday morning.  If the patient is still hospitalized I will follow-up at that time.  Call palliative medicine team phone # [phone] with questions or concerns in the interim  Questions and concerns addressed   emotional support offered.  Total time spent on the unit was 35 minutes  Greater than 50% of the time was spent in counseling and coordination of care  Lorinda Creed NP  Palliative Medicine Team Team Phone # [phone] Pager [phone]

## 2020-05-21 NOTE — Care Management Important Message (Signed)
Important Message  Patient Details  Name: [name] [name] MRN: [id number] Date of Birth: 22-Apr-1950   Medicare Important Message Given:  Yes     Dorena Bodo 05/21/2020, 2:30 PM

## 2020-05-21 NOTE — TOC Progression Note (Addendum)
Transition of Care (TOC) - Progression Note  Heart Failure   Patient Details  Name: [name] [name] MRN: [id number] Date of Birth: 1950/11/03  Transition of Care [facility]) CM/SW Contact  [name] [name], LCSWA Phone Number: 05/21/2020, 2:44 PM  Clinical Narrative:    CSW spoke with patient at bedside regarding SNF bed offers and she reported not having a preference and to reach out to her daughters. CSW brought Ms. Gordy Levan therapy resources. CSW reached out to PT to verify about CIR as an option for Mrs. Gordy Levan. CSW spoke with the patients daughters [name] [phone] and Orinda Kenner [phone] regarding SNF bed options and family still prefers CIR or out of the offers preference for [place]. PASRR pending received PASRR number [id number] E expiration date 06/20/2020 updated FL2 with PASRR information. Awaiting to hear from PT about CIR before proceeding with [facility].  CSW will continue to follow throughout discharge.   Expected Discharge Plan: Skilled Nursing Facility Barriers to Discharge: Continued Medical Work up  Expected Discharge Plan and Services Expected Discharge Plan: Skilled Nursing Facility In-house Referral: Clinical Social Work Discharge Planning Services: CM Consult Post Acute Care Choice: Skilled Nursing Facility,IP Rehab (Patient and her daughter really want to go to CIR instead of SNF) Living arrangements for the past 2 months: Apartment                                       Social Determinants of Health (SDOH) Interventions Food Insecurity Interventions: Intervention Not Indicated Financial Strain Interventions: Intervention Not Indicated Housing Interventions: Intervention Not Indicated Transportation Interventions: Intervention Not Indicated,Other (Comment) (Patient reported relying on family for tranpsortation needs)  Readmission Risk Interventions Readmission Risk Prevention Plan 02/07/2020  Post Dischage Appt Complete  Medication Screening Complete   Transportation Screening Complete  Some recent data might be hidden   Toniesha Zellner, MSW, LCSWA [phone] Heart Failure Social Worker

## 2020-05-21 NOTE — Progress Notes (Signed)
ANTICOAGULATION CONSULT NOTE  Pharmacy Consult for Heparin > Eliquis Indication: atrial fibrillation  Allergies  Allergen Reactions  . Amiodarone Shortness Of Breath and Other (See Comments)    Soreness, wheezing, and weakness also  . Fruit & Vegetable Daily [Nutritional Supplements] Shortness Of Breath, Swelling and Other (See Comments)    All fruits cause tongue swelling and numbness Organic fruits are okay to eat  . Metoprolol Shortness Of Breath, Other (See Comments) and Cough    Soreness, wheezing, and weakness also  . Other Shortness Of Breath, Swelling and Other (See Comments)    Tree nuts cause tongue swelling and numbness  . Peanut-Containing Drug Products Shortness Of Breath, Swelling and Other (See Comments)    Tongue swelling and numbness  . Aspirin Nausea And Vomiting and Other (See Comments)    Cannot take due to liver issues  . Tylenol [Acetaminophen] Other (See Comments)    Cannot take due to liver issues- tolerating 650 mg tablets in 2022  . Beef-Derived Products Other (See Comments)    Limited, per patient  . Lactose Intolerance (Gi) Diarrhea and Nausea And Vomiting    Patient Measurements: Height: 5\' 3"  (160 cm) Weight: 84.5 kg (186 lb 4.6 oz) IBW/kg (Calculated) : 52.4 Heparin Dosing Weight: 67 kg  Vital Signs: Temp: 98 F (36.7 C) (05/11 0744) Temp Source: Oral (05/11 0744) BP: 139/80 (05/11 0744) Pulse Rate: 99 (05/11 0744)  Labs: Recent Labs    05/19/20 0255 05/19/20 0956 05/19/20 0956 05/19/20 1045 05/20/20 0441 05/21/20 0501  HGB  --  11.1*   < >  --  10.0* 9.6*  HCT  --  33.4*  --   --  29.4* 29.2*  PLT  --  264  --   --  263 258  LABPROT  --   --   --  16.5*  --   --   INR  --   --   --  1.3*  --   --   HEPARINUNFRC 0.44  --   --   --  0.52 0.33  CREATININE 1.51*  --   --   --  1.40* 1.43*   < > = values in this interval not displayed.    Estimated Creatinine Clearance: 38.8 mL/min (A) (by C-G formula based on SCr of 1.43 mg/dL  (H)).   Medical History: Past Medical History:  Diagnosis Date  . Abdominal pain   . Atrial flutter (HCC)   . Back pain   . Chronic combined systolic and diastolic CHF (congestive heart failure) (HCC) 02/04/2020  . Constipation   . Diabetes mellitus without complication (HCC)   . Fatigue   . Full dentures   . Hemorrhoids   . History of noncompliance with medical treatment, presenting hazards to health   . Hypertension   . Lack of adequate sleep   . Liver mass   . PAF (paroxysmal atrial fibrillation) (HCC)   . Rash    on right breast  . Wears glasses     Assessment: 62 yoF admitted with acute CVA s/p tPA 5/2. Pt on apixaban with hx AFib with poor compliance. Heparin started with no bolus and low goal.  Heparin level is therapeutic at 0.33, on 1050 units/hr. Hgb 11.1>9.6, plt 258. No s/sx of bleeding or infusion issues.   Goal of Therapy:  Heparin level 0.3 - 0.5 units/ml APTT 66 - 85 secs Monitor platelets by anticoagulation protocol: Yes   Plan:  Will stop heparin this afternoon, and resume Eliquis  5 mg po BID.  Reece Leader, Colon Flattery, BCCP Clinical Pharmacist  05/21/2020 10:53 AM   [facility] pharmacy phone numbers are listed on [email or web address]

## 2020-05-22 DIAGNOSIS — E871 Hypo-osmolality and hyponatremia: Secondary | ICD-10-CM | POA: Diagnosis not present

## 2020-05-22 DIAGNOSIS — I5022 Chronic systolic (congestive) heart failure: Secondary | ICD-10-CM | POA: Diagnosis not present

## 2020-05-22 DIAGNOSIS — I5021 Acute systolic (congestive) heart failure: Secondary | ICD-10-CM | POA: Diagnosis not present

## 2020-05-22 DIAGNOSIS — R570 Cardiogenic shock: Secondary | ICD-10-CM | POA: Diagnosis not present

## 2020-05-22 LAB — CBC
HCT: 25.5 % — ABNORMAL LOW (ref 36.0–46.0)
Hemoglobin: 8.5 g/dL — ABNORMAL LOW (ref 12.0–15.0)
MCH: 25.8 pg — ABNORMAL LOW (ref 26.0–34.0)
MCHC: 33.3 g/dL (ref 30.0–36.0)
MCV: 77.3 fL — ABNORMAL LOW (ref 80.0–100.0)
Platelets: 279 10*3/uL (ref 150–400)
RBC: 3.3 MIL/uL — ABNORMAL LOW (ref 3.87–5.11)
RDW: 15.5 % (ref 11.5–15.5)
WBC: 20.8 10*3/uL — ABNORMAL HIGH (ref 4.0–10.5)
nRBC: 0.2 % (ref 0.0–0.2)

## 2020-05-22 LAB — URINALYSIS, ROUTINE W REFLEX MICROSCOPIC
Bilirubin Urine: NEGATIVE
Glucose, UA: NEGATIVE mg/dL
Ketones, ur: NEGATIVE mg/dL
Nitrite: NEGATIVE
Protein, ur: NEGATIVE mg/dL
Specific Gravity, Urine: 1.006 (ref 1.005–1.030)
pH: 7 (ref 5.0–8.0)

## 2020-05-22 LAB — BASIC METABOLIC PANEL
Anion gap: 9 (ref 5–15)
BUN: 23 mg/dL (ref 8–23)
CO2: 38 mmol/L — ABNORMAL HIGH (ref 22–32)
Calcium: 9.5 mg/dL (ref 8.9–10.3)
Chloride: 79 mmol/L — ABNORMAL LOW (ref 98–111)
Creatinine, Ser: 1.64 mg/dL — ABNORMAL HIGH (ref 0.44–1.00)
GFR, Estimated: 34 mL/min — ABNORMAL LOW (ref >60)
Glucose, Bld: 85 mg/dL (ref 70–99)
Potassium: 5.6 mmol/L — ABNORMAL HIGH (ref 3.5–5.1)
Sodium: 126 mmol/L — ABNORMAL LOW (ref 135–145)

## 2020-05-22 LAB — GLUCOSE, CAPILLARY
Glucose-Capillary: 81 mg/dL (ref 70–99)
Glucose-Capillary: 72 mg/dL (ref 70–99)
Glucose-Capillary: 165 mg/dL — ABNORMAL HIGH (ref 70–99)
Glucose-Capillary: 145 mg/dL — ABNORMAL HIGH (ref 70–99)
Glucose-Capillary: 263 mg/dL — ABNORMAL HIGH (ref 70–99)

## 2020-05-22 LAB — POTASSIUM: Potassium: 5.3 mmol/L — ABNORMAL HIGH (ref 3.5–5.1)

## 2020-05-22 MED ORDER — SODIUM ZIRCONIUM CYCLOSILICATE 10 G PO PACK
10.0000 g | PACK | Freq: Once | ORAL | Status: AC
  Administered 2020-05-22: 10 g via ORAL
  Filled 2020-05-22: qty 1

## 2020-05-22 MED ORDER — SPIRONOLACTONE 25 MG PO TABS: 25.0000 mg | ORAL_TABLET | Freq: Every day | ORAL | Status: DC

## 2020-05-22 MED ORDER — INSULIN GLARGINE 100 UNIT/ML ~~LOC~~ SOLN
15.0000 [IU] | Freq: Every day | SUBCUTANEOUS | Status: DC
  Administered 2020-05-22 – 2020-05-26 (×5): 15 [IU] via SUBCUTANEOUS
  Filled 2020-05-22 (×5): qty 0.15

## 2020-05-22 MED ORDER — PROSOURCE PLUS PO LIQD
30.0000 mL | Freq: Two times a day (BID) | ORAL | Status: DC
  Administered 2020-05-22 – 2020-05-26 (×5): 30 mL via ORAL
  Filled 2020-05-22 (×4): qty 30

## 2020-05-22 MED ORDER — INSULIN ASPART 100 UNIT/ML IJ SOLN
0.0000 [IU] | Freq: Three times a day (TID) | INTRAMUSCULAR | Status: DC
  Administered 2020-05-22: 3 [IU] via SUBCUTANEOUS
  Administered 2020-05-22: 2 [IU] via SUBCUTANEOUS
  Administered 2020-05-23: 3 [IU] via SUBCUTANEOUS
  Administered 2020-05-23: 5 [IU] via SUBCUTANEOUS
  Administered 2020-05-24: 3 [IU] via SUBCUTANEOUS
  Administered 2020-05-24: 5 [IU] via SUBCUTANEOUS
  Administered 2020-05-24: 3 [IU] via SUBCUTANEOUS
  Administered 2020-05-25: 5 [IU] via SUBCUTANEOUS
  Administered 2020-05-25: 2 [IU] via SUBCUTANEOUS
  Administered 2020-05-25: 3 [IU] via SUBCUTANEOUS
  Administered 2020-05-26: 8 [IU] via SUBCUTANEOUS
  Administered 2020-05-26 (×2): 3 [IU] via SUBCUTANEOUS

## 2020-05-22 MED ORDER — FLEET ENEMA 7-19 GM/118ML RE ENEM
1.0000 | ENEMA | Freq: Once | RECTAL | Status: AC
  Administered 2020-05-22: 1 via RECTAL
  Filled 2020-05-22: qty 1

## 2020-05-22 MED ORDER — ADULT MULTIVITAMIN W/MINERALS CH
1.0000 | ORAL_TABLET | Freq: Every day | ORAL | Status: DC
  Administered 2020-05-22 – 2020-05-26 (×5): 1 via ORAL
  Filled 2020-05-22 (×5): qty 1

## 2020-05-22 MED ORDER — SODIUM CHLORIDE 0.9 % IV SOLN
1.0000 g | INTRAVENOUS | Status: AC
  Administered 2020-05-22 – 2020-05-24 (×3): 1 g via INTRAVENOUS
  Filled 2020-05-22 (×3): qty 10

## 2020-05-22 NOTE — Progress Notes (Signed)
This chaplain is present for F/U spiritual care.  The Pt. is awake nibbling on her lunch during the visit. The Pt. is verbally responsive with the chaplain, but presents herself with less energy than in previous spiritual care visits.   The chaplain understands the Pt. does not have an appetite and is depending on Ensure as a supplement.  The Pt. agrees to needing the energy for her goal of building strength.  The chaplain understands the Pt. is accepting [facility] as a SNF with possible discharge on Friday, because it is close to family.  The Pt. accepted the chaplain's invitation for F/U spiritual care and prayer.

## 2020-05-22 NOTE — Progress Notes (Signed)
Orthopedic Tech Progress Note Patient Details:  [name] [name] [birth year] [id number]  Ortho Devices Type of Ortho Device: [name] boot Ortho Device/Splint Location: Bilateral Ortho Device/Splint Interventions: Ordered,Application   Post Interventions Patient Tolerated: Well Instructions Provided: Care of device   [name] A Gwynn Crossley 05/22/2020, 5:45 PM

## 2020-05-22 NOTE — Progress Notes (Signed)
Physical Therapy Treatment Patient Details Name: [name] [name] MRN: [id number] DOB: 1950-10-18 Today's Date: 05/22/2020    History of Present Illness Pt is a 71 y.o. female admitted 05/12/20 with L-side weakness. Pt received tPA, s/p mechanical thrombectomy; post-procedure PEA arrest, received CPR <5-min. CTA showed R MCA occlusion with successful revascularization. ETT 5/2-5/3. S/p TEEE/DCCV on 5/9 with return to [facility]R. PMH includes DM, HF, HTN, afib, COVID (01/2020).    PT Comments    Session limited by abdominal pain and fatigue. Patient declining ambulation in the room. Patient able to perform sit to stand with RW and min guard with +2 for safety due to orthostatics during previous session. Patient performed stand pivot with minA+2 and RW. Patient continues to be limited by decreased activity tolerance. Continue to recommend SNF for ongoing Physical Therapy.       Follow Up Recommendations  SNF;Supervision/Assistance - 24 hour     Equipment Recommendations  Rolling [name] [name] with 5" wheels;3in1 (PT)    Recommendations for Other Services       Precautions / Restrictions Precautions Precautions: Fall;Other (comment) Precaution Comments: watch O2 (patient does not wear O2 at baseline); (+) orthostatic hypotension 5/10, (-) on 5/12 Restrictions Weight Bearing Restrictions: No    Mobility  Bed Mobility Overal bed mobility: Needs Assistance Bed Mobility: Supine to Sit;Sit to Supine     Supine to sit: Min assist Sit to supine: Min assist   General bed mobility comments: minA for repositioning hips towards EOB, assist for LEs back into bed    Transfers Overall transfer level: Needs assistance Equipment used: Rolling [name] [name] (2 wheeled) Transfers: Sit to/from [name] Sit to Stand: Min guard;+2 safety/equipment Stand pivot transfers: Min assist;+2 safety/equipment       General transfer comment: sit to stand x 2 from EOB and BSC. Min guard for safety,  assessed BP in standing. MinA for stand pivot transfer to BSC<>bed for balance and safety  Ambulation/Gait             General Gait Details: declined due to abdominal pain   Stairs             Wheelchair Mobility    Modified Rankin (Stroke Patients Only) Modified Rankin (Stroke Patients Only) Pre-Morbid Rankin Score: No symptoms Modified Rankin: Moderate disability     Balance Overall balance assessment: Needs assistance Sitting-balance support: No upper extremity supported Sitting balance-[name] [name] Scale: Fair     Standing balance support: Bilateral upper extremity supported;During functional activity Standing balance-[name] [name] Scale: Poor Standing balance comment: reliant on UE support in with mobility                            Cognition Arousal/Alertness: Awake/alert Behavior During Therapy: Flat affect Overall Cognitive Status: No family/caregiver present to determine baseline cognitive functioning                                 General Comments: Following commands with increased time with flat affect throughout. Little motivation for mobility this session      Exercises      General Comments General comments (skin integrity, edema, etc.): On 3L O2 [place] with spO2 readings not reliable due to pleth but no SOB noted throuhgout      Pertinent Vitals/Pain Pain Assessment: Faces Faces Pain Scale: Hurts little more Pain Location: abdomen, back Pain Descriptors / Indicators: Discomfort Pain Intervention(s): Monitored during  session;Repositioned    Home Living                      Prior Function            PT Goals (current goals can now be found in the care plan section) Acute Rehab PT Goals Patient Stated Goal: get back to independent PT Goal Formulation: With patient Time For Goal Achievement: 05/28/20 Potential to Achieve Goals: Good Progress towards PT goals: Progressing toward goals    Frequency    Min  2X/week      PT Plan Current plan remains appropriate    Co-evaluation PT/OT/SLP Co-Evaluation/Treatment: Yes Reason for Co-Treatment: For patient/therapist safety;To address functional/ADL transfers PT goals addressed during session: Mobility/safety with mobility;Balance;Proper use of DME        AM-PAC PT "6 Clicks" Mobility   Outcome Measure  Help needed turning from your back to your side while in a flat bed without using bedrails?: A Little Help needed moving from lying on your back to sitting on the side of a flat bed without using bedrails?: A Little Help needed moving to and from a bed to a chair (including a wheelchair)?: A Little Help needed standing up from a chair using your arms (e.g., wheelchair or bedside chair)?: A Little Help needed to walk in hospital room?: A Little Help needed climbing 3-5 steps with a railing? : A Lot 6 Click Score: 17    End of Session Equipment Utilized During Treatment: Gait belt;Oxygen Activity Tolerance: Patient limited by fatigue;Patient limited by pain Patient left: in bed;with call bell/phone within reach;with bed alarm set Nurse Communication: Mobility status PT Visit Diagnosis: Unsteadiness on feet (R26.81);Other abnormalities of gait and mobility (R26.89);Muscle weakness (generalized) (M62.81);Difficulty in walking, not elsewhere classified (R26.2)     Time: 7253-6644 PT Time Calculation (min) (ACUTE ONLY): 26 min  Charges:  $Therapeutic Activity: 8-22 mins                     Charlette Hennings A. Dan Humphreys PT, DPT Acute Rehabilitation Services Pager [phone] Office [phone]    Viviann Spare 05/22/2020, 4:03 PM

## 2020-05-22 NOTE — TOC Progression Note (Addendum)
Transition of Care (TOC) - Progression Note  Heart Failure  Patient Details  Name: [name] [name] MRN: [id number] Date of Birth: 1950-05-11  Transition of Care [facility]) CM/SW Contact  Audrionna Lampton, LCSWA Phone Number: 05/22/2020, 1:05 PM  Clinical Narrative:    CSW called [facility] and spoke with Star [phone] who extended a bed offer for Ms. Hilger and Star reported she will check her payor source and give CSW a call back. Star called CSW back and reported they do have a bed available for Ms. Gordy Levan upon discharge. CSW spoke with Star about [facility]'s visitation policy per family's request.  [employer] [facility] reference number [id number] for pending health insurance authorization for [place]. Awaiting insurance authorization from [facility].  CSW spoke with the patients daughter Orinda Kenner [phone] and informed her about [facility]'s visitation policy and recommendation for her mom to get boosted before discharging to [facility]. Orinda Kenner agreed that if CIR is not an option that [name] will work for her mom. Gajetta asked CSW to call her sister [name] and inform her has well about the update. CSW attempted to outreach [name] [phone] however she didn't answer the phone and CSW left a voicemail for her to return the call.  TOC will continue to follow for discharge.   Expected Discharge Plan: Skilled Nursing Facility Barriers to Discharge: Continued Medical Work up  Expected Discharge Plan and Services Expected Discharge Plan: Skilled Nursing Facility In-house Referral: Clinical Social Work Discharge Planning Services: CM Consult Post Acute Care Choice: Skilled Nursing Facility,IP Rehab (Patient and her daughter really want to go to CIR instead of SNF) Living arrangements for the past 2 months: Apartment                                       Social Determinants of Health (SDOH) Interventions Food Insecurity Interventions: Intervention Not Indicated Financial Strain Interventions:  Intervention Not Indicated Housing Interventions: Intervention Not Indicated Transportation Interventions: Intervention Not Indicated,Other (Comment) (Patient reported relying on family for tranpsortation needs)  Readmission Risk Interventions Readmission Risk Prevention Plan 02/07/2020  Post Dischage Appt Complete  Medication Screening Complete  Transportation Screening Complete  Some recent data might be hidden   Shrita Thien, MSW, LCSWA [phone] Heart Failure Social Worker

## 2020-05-22 NOTE — Progress Notes (Signed)
Nutrition Follow-up  DOCUMENTATION CODES:  Not applicable  INTERVENTION:  -Recommend liberalizing diet to regular to optimize po intake -2ml Prosource Plus po BID, each supplement provides 100 kcals and 15 grams of protein -MVI with minerals daily -Continue Ensure Enlive po BID, each supplement provides 350 kcal and 20 grams of protein. This supplement is Lactose Free  NUTRITION DIAGNOSIS:  Inadequate oral intake related to decreased appetite,early satiety as evidenced by per patient/family report. - ongoing  GOAL:  Patient will meet greater than or equal to 90% of their needs- progressing  MONITOR: PO intake,Supplement acceptance,Labs,Weight trends  REASON FOR ASSESSMENT:  Rounds   ASSESSMENT:  70 yo female admitted with acute on chronic CHF, acute embolic CVA. PMH includes HTN, medical noncompliance, HLD, CHF LVEF 25-50%, poorly controlled DM  5/02 Code Stroke, Thrombectomy, PEA arrest 5/03 Extubated 5/09 s/p TEE/DCCV 5/10 tx to [facility]   Note potassium and creatinine levels have been increasing; MD noted pt was started on Lokelma this morning, but not listed in medications. MD also noted plan to reduce Lantus due to low CBGs. Lastly, pt with possible UTI and a gradual drop in hgb though no overt bleeding identified.  Pt complaining of constipation. No BM documented since PTA. MD aware. Otherwise, pt still endorsing early satiety. Last 8 meals have been charted as 0-100% completion (33% average meal intake), though there are several days where no meal documentation was provided. Pt has been receiving Ensure BID. Per RN, pt typically doing well with supplement but is occasionally refusing. Will provide pt with additional supplement options in hopes of increasing intake. Will also recommend diet liberalization given continued poor po. Reached out to MD with recommendation/request.   UOP: documented x24 hours  Meds: pepcid, ss novolog, lantus, protonix, miralax, senokot-s,  aldactone Labs: Na 126 (L, trending up), K+ 5.6 (H, trending up), Cr 1.64 (H, trending up) CBGs 81-72-165  Diet Order:   Diet Order            Diet 2 gram sodium Room service appropriate? Yes; Fluid consistency: Thin; Fluid restriction: 2000 mL Fluid  Diet effective now                EDUCATION NEEDS:  Education needs have been addressed  Skin:  Skin Assessment: Reviewed RN Assessment  Last BM:  PTA  Height:  Ht Readings from Last 1 Encounters:  05/19/20 5\' 3"  (1.6 m)   Weight:  Wt Readings from Last 1 Encounters:  05/22/20 85.3 kg   BMI:  Body mass index is 33.31 kg/m.  Estimated Nutritional Needs:  Kcal:  1700-1900 kcals Protein:  85-95 g Fluid:  >/= 1.7 L  07/22/20, MS, RD, LDN RD pager number and weekend/on-call pager number located in [place].

## 2020-05-22 NOTE — Progress Notes (Signed)
Occupational Therapy Treatment Patient Details Name: [name] [name] MRN: [id number] DOB: [birth year] Today's Date: 05/22/2020    History of present illness Pt is a 70 y.o. female admitted 05/12/20 with L-side weakness. Pt received tPA, s/p mechanical thrombectomy; post-procedure PEA arrest, received CPR <5-min. CTA showed R MCA occlusion with successful revascularization. ETT 5/2-5/3. S/p TEEE/DCCV on 5/9 with return to NSR. PMH includes DM, HF, HTN, afib, COVID (01/2020).   OT comments  Patient with nice progress to all patient focused OT goals.  Seen in conjunction with PT, however, based on today's progress, +2 does not appear needed anymore.  Patient was limited by abdominal discomfort.  Patient transferred to [facility] with Min Guard of 2, and was able to perform hygiene with Min guard as well.  Patient feels as though she needs to have a BM, and nursing to trial enema.  OT will continue to follow in the acute setting to maximize her functional abilities, SNF is still recommended for post acute rehab.    Follow Up Recommendations  SNF    Equipment Recommendations  3 in 1 bedside commode    Recommendations for Other Services      Precautions / Restrictions Precautions Precautions: Fall;Other (comment) Precaution Comments: watch O2 (patient does not wear O2 at baseline); (+) orthostatic hypotension 5/10, (-) on 5/12 Restrictions Weight Bearing Restrictions: No       Mobility Bed Mobility Overal bed mobility: Needs Assistance Bed Mobility: Supine to Sit;Sit to Supine     Supine to sit: Min assist Sit to supine: Min assist   General bed mobility comments: minA for repositioning hips towards EOB, assist for LEs back into bed Patient Response: Cooperative  Transfers Overall transfer level: Needs assistance Equipment used: Rolling walker (2 wheeled) Transfers: Sit to/from [name] Sit to Stand: Min guard;+2 safety/equipment Stand pivot transfers: Min assist;+2  safety/equipment       General transfer comment: sit to stand x 2 from EOB and BSC. Min guard for safety, assessed BP in standing. MinA for stand pivot transfer to BSC<>bed for balance and safety    Balance Overall balance assessment: Needs assistance Sitting-balance support: No upper extremity supported Sitting balance-Leahy Scale: Fair     Standing balance support: Bilateral upper extremity supported;During functional activity Standing balance-Leahy Scale: Poor Standing balance comment: reliant on UE support in with mobility                           ADL either performed or assessed with clinical judgement   ADL       Grooming: Set up;Sitting                   Toilet Transfer: Min guard;+2 for safety/equipment;BSC;RW;Stand-pivot   Toileting- Clothing Manipulation and Hygiene: Min guard;Sit to/from stand       Functional mobility during ADLs: Rolling walker;Min guard                         Cognition Arousal/Alertness: Awake/alert Behavior During Therapy: Flat affect Overall Cognitive Status: No family/caregiver present to determine baseline cognitive functioning                                 General Comments: Following commands with increased time with flat affect throughout. Little motivation for mobility this session        Exercises  Shoulder Instructions       General Comments On 3L O2 [place] with spO2 readings not reliable due to pleth but no SOB noted throuhgout    Pertinent Vitals/ Pain       Pain Assessment: Faces Faces Pain Scale: Hurts little more Pain Location: abdomen, back Pain Descriptors / Indicators: Discomfort;Pressure Pain Intervention(s): Monitored during session                                                          Frequency  Min 2X/week        Progress Toward Goals  OT Goals(current goals can now be found in the care plan section)  Progress towards OT  goals: Progressing toward goals  Acute Rehab OT Goals Patient Stated Goal: get back to independent OT Goal Formulation: With patient Time For Goal Achievement: 05/28/20 Potential to Achieve Goals: Good  Plan Discharge plan remains appropriate;Frequency remains appropriate    Co-evaluation    PT/OT/SLP Co-Evaluation/Treatment: Yes Reason for Co-Treatment: Complexity of the patient's impairments (multi-system involvement);For patient/therapist safety PT goals addressed during session: Mobility/safety with mobility;Balance;Proper use of DME OT goals addressed during session: ADL's and self-care      AM-PAC OT "6 Clicks" Daily Activity     Outcome Measure   Help from another person eating meals?: None Help from another person taking care of personal grooming?: None Help from another person toileting, which includes using toliet, bedpan, or urinal?: A Little Help from another person bathing (including washing, rinsing, drying)?: A Little Help from another person to put on and taking off regular upper body clothing?: A Little Help from another person to put on and taking off regular lower body clothing?: A Lot 6 Click Score: 19    End of Session Equipment Utilized During Treatment: Gait belt;Rolling walker;Oxygen  OT Visit Diagnosis: Unsteadiness on feet (R26.81);Other abnormalities of gait and mobility (R26.89);Pain;Muscle weakness (generalized) (M62.81);Other symptoms and signs involving cognitive function   Activity Tolerance Patient tolerated treatment well   Patient Left in bed;with call bell/phone within reach   Nurse Communication          Time: 1455-1519 OT Time Calculation (min): 24 min  Charges: OT General Charges $OT Visit: 1 Visit OT Treatments $Self Care/Home Management : 8-22 mins  05/22/2020  Rich, OTR/L  Acute Rehabilitation Services  Office:  [phone]    Suzanna Obey 05/22/2020, 4:23 PM

## 2020-05-22 NOTE — Progress Notes (Signed)
Patient ID: [name] [name], female   DOB: 1950-02-17, 70 y.o.   MRN: [id number]     Advanced Heart Failure Rounding Note  PCP-Cardiologist: Kardie Tobb, DO   Subjective:    5/5 Milrinone increased to 0.25 mcg. Off norepi. 5/8 Lasix drip stopped. Continue on milrinone 0.125 mcg.  5/9 S/P TEE/DC-CV -->NSR.  5/10 switched to po amio . Given tolvaptan. Orthostatic with PT.   Denies SOB, orthopnea or PND. Refusing to walk with PT due to fatigue. Remains in NSR. Had hyperkalemia today. Given Lokelma   Objective:   Weight Range: 85.3 kg Body mass index is 33.31 kg/m.   Vital Signs:   Temp:  [97.3 F (36.3 C)-98.9 F (37.2 C)] 97.8 F (36.6 C) (05/12 1111) Pulse Rate:  [89-101] 89 (05/12 1111) Resp:  [16-24] 20 (05/12 1111) BP: (119-160)/(77-98) 155/79 (05/12 1111) SpO2:  [98 %-100 %] 100 % (05/12 1111) Weight:  [85.3 kg] 85.3 kg (05/12 0000) Last BM Date:  (no BM yet, Miralax and Senna Given)  Weight change: Filed Weights   05/20/20 0500 05/21/20 0018 05/22/20 0000  Weight: 81.9 kg 84.5 kg 85.3 kg    Intake/Output:   Intake/Output Summary (Last 24 hours) at 05/22/2020 1620 Last data filed at 05/22/2020 0259 Gross per 24 hour  Intake 230 ml  Output 500 ml  Net -270 ml      Physical Exam   General:  Lying flat in bed No resp difficulty HEENT: normal Neck: supple. no JVD. Carotids 2+ bilat; no bruits. No lymphadenopathy or thryomegaly appreciated. Cor: PMI nondisplaced. Regular rate & rhythm. No rubs, gallops or murmurs. Lungs: clear Abdomen: soft, nontender, nondistended. No hepatosplenomegaly. No bruits or masses. Good bowel sounds. Extremities: no cyanosis, clubbing, rash, edema Neuro: alert & orientedx3, cranial nerves grossly intact. moves all 4 extremities w/o difficulty. Affect pleasant    Telemetry   SR 80-90s Personally reviewed   Labs    CBC Recent Labs    05/21/20 0501 05/22/20 0354  WBC 18.1* 20.8*  HGB 9.6* 8.5*  HCT 29.2* 25.5*  MCV  77.0* 77.3*  PLT 258 279   Basic Metabolic Panel Recent Labs    78/29/56 0441 05/20/20 2307 05/21/20 0501 05/22/20 0354 05/22/20 1220  NA 123*   < > 124* 126*  --   K 4.0  --  5.0 5.6* 5.3*  CL 75*  --  78* 79*  --   CO2 40*  --  36* 38*  --   GLUCOSE 126*  --  168* 85  --   BUN 17  --  19 23  --   CREATININE 1.40*  --  1.43* 1.64*  --   CALCIUM 8.6*  --  9.0 9.5  --   MG 2.0  --   --   --   --    < > = values in this interval not displayed.   Liver Function Tests Recent Labs    05/20/20 0441  AST 37  ALT 37  ALKPHOS 194*  BILITOT 1.3*  PROT 6.1*  ALBUMIN 2.1*   No results for input(s): LIPASE, AMYLASE in the last 72 hours. Cardiac Enzymes No results for input(s): CKTOTAL, CKMB, CKMBINDEX, TROPONINI in the last 72 hours.  BNP: BNP (last 3 results) Recent Labs    03/03/20 1559  BNP 1,981.9*    ProBNP (last 3 results) No results for input(s): PROBNP in the last 8760 hours.   D-Dimer No results for input(s): DDIMER in the last 72 hours. Hemoglobin  A1C No results for input(s): HGBA1C in the last 72 hours. Fasting Lipid Panel No results for input(s): CHOL, HDL, LDLCALC, TRIG, CHOLHDL, LDLDIRECT in the last 72 hours. Thyroid Function Tests No results for input(s): TSH, T4TOTAL, T3FREE, THYROIDAB in the last 72 hours.  Invalid input(s): FREET3  Other results:   Imaging    No results found.   Medications:     Scheduled Medications: . (feeding supplement) PROSource Plus  30 mL Oral BID BM  . amiodarone  200 mg Oral BID  . apixaban  5 mg Oral BID  . atorvastatin  80 mg Oral Daily  . Chlorhexidine Gluconate Cloth  6 each Topical Daily  . digoxin  0.125 mg Oral Daily  . famotidine  20 mg Oral Q2000  . feeding supplement  237 mL Oral BID BM  . insulin aspart  0-15 Units Subcutaneous TID WC  . insulin glargine  15 Units Subcutaneous QHS  . lidocaine  1 patch Transdermal Q24H  . multivitamin with minerals  1 tablet Oral Daily  . pantoprazole  40  mg Oral Daily  . polyethylene glycol  17 g Oral Daily  . senna-docusate  2 tablet Oral BID  . [START ON 05/24/2020] spironolactone  25 mg Oral Daily    Infusions: . sodium chloride Stopped (05/16/20 0600)  . cefTRIAXone (ROCEPHIN)  IV 1 g (05/22/20 0905)    PRN Medications: sodium chloride, bisacodyl, ondansetron (ZOFRAN) IV, oxyCODONE, simethicone    Assessment/Plan   1. 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 Hold diuretics. Orthostatic 5/10 with PT.  - off metoprolol -- patient says she is allergic though she was on this prior to admit.  - Hold off on entresto or farxiga with orthostasis.  - continue digoxin  - Stop spiro with hyerpkalemia - Will need to consider losartan prior to d/c (if hyperkalemia better) to get he on some GDMT   2. AF with RVR/atrial tach - amiodarone for rate/rhythm control -  AT =>  atrial tachy with 2:1 block.   - 5/9 S/P TEE DC-CV -->NSR.  - Maintaining NSR. Continue amio 200 mg twice a day.  - Continue Eliquis 5 bid  3. Acute embolic CVA  - s/p successful revascularization with systemic T-PA and mechanical thrombectomy - no clinical residual and no significant defect on MRI - Continue ELiquis - Continue PT/OT--recommending SNF  4. Non-compliance - encouraged her to work with PT  5. Bipolar d/o - will need outpatient f/u w/ mental health   6. Poorly controlled DM2 - hgb A1c 10.5  - insulin per primary team   7. PEA arrest - Had brief CPR and intubated. - On 2 liters [place].   8. Moderate Pericardial Effusion w/o Tamponade  - Eventual repeat echo.   9. Leukocytosis - improving on ceftriaxone   SW following for placement.   Length of Stay: 10  Arvilla Meres, MD  05/22/2020, 4:20 PM  Advanced Heart Failure Team Pager [phone] (M-F; 7a - 5p)  Please contact [facility] for  night-coverage after hours (5p -7a ) and weekends on [email or web address]

## 2020-05-22 NOTE — Progress Notes (Addendum)
[facility] PROGRESS NOTE   [name] [name] GYB:[id number] DOB: 08/28/1950 DOA: 05/12/2020  PCP: Dana Allan, MD  Brief History/Interval Summary: 70 year old lady with prior history of atrial fibrillation, chronic systolic heart failure with left ventricular ejection fraction of 25 to 30%, hypertension, hyperlipidemia, history of COVID-19 infection in the past  presented with left-sided weakness, required tPA and mechanical thrombectomy, had a cardiac arrest/PEA arrest immediately following her thrombectomy, underwent CPR for less than 5 minutes, intubated, started on pressors transferred to [facility] service.  Patient was extubated on 05/13/2020 and was weaned off vasopressors.  Patient also underwent TEE/DCCV on 05/19/20. She was transferred to [facility] on 05/20/2020.  Consultants:  Critical care medicine Heart failure team Palliative care  Procedures:  TEE/DC cardioversion on 5/9  Antibiotics: Anti-infectives (From admission, onward)   Start     Dose/Rate Route Frequency Ordered Stop   05/22/20 0915  cefTRIAXone (ROCEPHIN) 1 g in sodium chloride 0.9 % 100 mL IVPB        1 g 200 mL/hr over 30 Minutes Intravenous Every 24 hours 05/22/20 0825 05/25/20 0914      Subjective/Interval History: Patient continues to have discomfort in her right back area as well as right arm.  This is as a result of her fall.  Complains of constipation.  No chest pain.  No shortness of breath at this time     Assessment/Plan:  Acute embolic CVA She is status post revascularization with tPA and mechanical thrombectomy.  Seen by neurology.  Seen by PT and OT.  Plan is for skilled nursing facility when medically stable. LDL 61.  HbA1c 10.0.  Noted to be on statin.  Patient was initially on IV heparin and has been transitioned to apixaban.  Acute on chronic systolic CHF/cardiogenic shock/PEA arrest Patient went into cardiac arrest after she underwent tPA and mechanical thrombectomy for acute stroke.    Patient seen by heart failure team.  Patient was on milrinone infusion and on diuretics.  Subsequently taken off of milrinone.  Currently on digoxin and spironolactone.  It appears the plan was to initiate Entresto.  However her potassium level and creatinine noted to be higher this morning.  We will give her Lokelma.  Recheck potassium level later today.  May need to hold the spironolactone but will defer this to cardiology.   Strict ins and outs and daily weights  Hyponatremia Given tolvaptan 5/10.  Sodium level is improving gradually.  Atrial fibrillation with RVR Noted to be on amiodarone.  Changed over to Eliquis.  Diabetes mellitus type 2, uncontrolled with hyperglycemia HbA1c 10.5.  Patient currently on Lantus insulin and SSI.  Low CBGs noted this morning.  We will cut back on the dose of Lantus.  Changed to [facility] nightly CBG correction.  Leukocytosis/possible UTI WBC continues to rise.  Reason for this is not entirely clear but could be due to the urinary tract infection.  UA noted to be abnormal.  Patient not able to tell if she has any urinary symptoms.  We will give her a 3-day course of ceftriaxone.  Urine culture.  Recheck labs tomorrow.    Anemia of chronic disease Gradual drop in hemoglobin is noted.  No overt bleeding identified.  She is on anticoagulation.  Looks like she was given iron infusion a few days ago.  Continue to trend.  Bipolar disorder Outpatient follow-up.  Obesity Estimated body mass index is 33.31 kg/m as calculated from the following:   Height as of this encounter: 5\' 3"  (  1.6 m).   Weight as of this encounter: 85.3 kg.   DVT Prophylaxis: Apixaban Code Status: Full code Family Communication: Discussed with the patient.  No family at bedside Disposition Plan: SNF when medically stable  Status is: Inpatient  Remains inpatient appropriate because:IV treatments appropriate due to intensity of illness or inability to take PO   Dispo: The patient is  from: Home              Anticipated d/c is to: SNF              Patient currently is not medically stable to d/c.   Difficult to place patient No         Medications:  Scheduled: . amiodarone  200 mg Oral BID  . apixaban  5 mg Oral BID  . atorvastatin  80 mg Oral Daily  . Chlorhexidine Gluconate Cloth  6 each Topical Daily  . digoxin  0.125 mg Oral Daily  . famotidine  20 mg Oral Q2000  . feeding supplement  237 mL Oral BID BM  . insulin aspart  0-15 Units Subcutaneous TID WC  . insulin glargine  15 Units Subcutaneous QHS  . lidocaine  1 patch Transdermal Q24H  . pantoprazole  40 mg Oral Daily  . polyethylene glycol  17 g Oral Daily  . senna-docusate  2 tablet Oral BID  . sodium phosphate  1 enema Rectal Once  . sodium zirconium cyclosilicate  10 g Oral Once  . spironolactone  25 mg Oral Daily   Continuous: . sodium chloride Stopped (05/16/20 0600)  . cefTRIAXone (ROCEPHIN)  IV     DPO:EUMPNT chloride, bisacodyl, ondansetron (ZOFRAN) IV, oxyCODONE, simethicone   Objective:  Vital Signs  Vitals:   05/21/20 2353 05/22/20 0000 05/22/20 0425 05/22/20 0809  BP: 126/78  119/77 (!) 160/92  Pulse: 98  96 95  Resp: 16  (!) 21 20  Temp: (!) 97.3 F (36.3 C)  98.3 F (36.8 C) 97.8 F (36.6 C)  TempSrc: Oral  Axillary Oral  SpO2: 99%  100% 98%  Weight:  85.3 kg    Height:        Intake/Output Summary (Last 24 hours) at 05/22/2020 0854 Last data filed at 05/22/2020 0259 Gross per 24 hour  Intake 853.77 ml  Output 500 ml  Net 353.77 ml   Filed Weights   05/20/20 0500 05/21/20 0018 05/22/20 0000  Weight: 81.9 kg 84.5 kg 85.3 kg    General appearance: Awake alert.  In no distress Resp: Diminished air entry at the bases with few crackles.  No wheezing or rhonchi. Cardio: S1-S2 is normal regular.  No S3-S4.  No rubs murmurs or bruit GI: Abdomen is soft.  Nontender nondistended.  Bowel sounds are present normal.  No masses organomegaly Extremities: Full range of  motion noted of the right arm and right leg. Neurologic: Mild left-sided deficits    Lab Results:  Data Reviewed: I have personally reviewed following labs and imaging studies  CBC: Recent Labs  Lab 05/18/20 0416 05/19/20 0956 05/20/20 0441 05/21/20 0501 05/22/20 0354  WBC 8.8 10.0 13.3* 18.1* 20.8*  HGB 12.1 11.1* 10.0* 9.6* 8.5*  HCT 36.5 33.4* 29.4* 29.2* 25.5*  MCV 74.9* 74.9* 74.8* 77.0* 77.3*  PLT 271 264 263 258 279    Basic Metabolic Panel: Recent Labs  Lab 05/16/20 0319 05/17/20 0301 05/18/20 0416 05/19/20 0255 05/20/20 0441 05/20/20 2307 05/21/20 0501 05/22/20 0354  NA 129* 128* 125* 124* 123* 122* 124* 126*  K 4.0 3.5 3.8 3.9 4.0  --  5.0 5.6*  CL 92* 80* 72* 70* 75*  --  78* 79*  CO2 30 39* 43* 45* 40*  --  36* 38*  GLUCOSE 163* 100* 138* 116* 126*  --  168* 85  BUN 20 15 14 17 17   --  19 23  CREATININE 1.32* 1.23* 1.45* 1.51* 1.40*  --  1.43* 1.64*  CALCIUM 8.0* 8.9 9.3 9.1 8.6*  --  9.0 9.5  MG 1.5* 1.8  --  1.5* 2.0  --   --   --     GFR: Estimated Creatinine Clearance: 34 mL/min (A) (by C-G formula based on SCr of 1.64 mg/dL (H)).  Liver Function Tests: Recent Labs  Lab 05/20/20 0441  AST 37  ALT 37  ALKPHOS 194*  BILITOT 1.3*  PROT 6.1*  ALBUMIN 2.1*     Coagulation Profile: Recent Labs  Lab 05/19/20 1045  INR 1.3*    CBG: Recent Labs  Lab 05/21/20 1540 05/21/20 2007 05/21/20 2353 05/22/20 0424 05/22/20 0811  GLUCAP 225* 176* 133* 81 72      Recent Results (from the past 240 hour(s))  Resp Panel by RT-PCR (Flu A&B, Covid) Nasopharyngeal Swab     Status: None   Collection Time: 05/12/20  1:16 PM   Specimen: Nasopharyngeal Swab; Nasopharyngeal(NP) swabs in vial transport medium  Result Value Ref Range Status   SARS Coronavirus 2 by RT PCR NEGATIVE NEGATIVE Final    Comment: (NOTE) SARS-CoV-2 target nucleic acids are NOT DETECTED.  The SARS-CoV-2 RNA is generally detectable in upper respiratory specimens during  the acute phase of infection. The lowest concentration of SARS-CoV-2 viral copies this assay can detect is 138 copies/mL. A negative result does not preclude SARS-Cov-2 infection and should not be used as the sole basis for treatment or other patient management decisions. A negative result may occur with  improper specimen collection/handling, submission of specimen other than nasopharyngeal swab, presence of viral mutation(s) within the areas targeted by this assay, and inadequate number of viral copies(<138 copies/mL). A negative result must be combined with clinical observations, patient history, and epidemiological information. The expected result is Negative.  Fact Sheet for Patients:  07/12/20  Fact Sheet for Healthcare Providers:  BloggerCourse.com  This test is no t yet approved or cleared by the [employer] FDA and  has been authorized for detection and/or diagnosis of SARS-CoV-2 by FDA under an Emergency Use Authorization (EUA). This EUA will remain  in effect (meaning this test can be used) for the duration of the COVID-19 declaration under Section 564(b)(1) of the Act, 21 U.S.C.section 360bbb-3(b)(1), unless the authorization is terminated  or revoked sooner.       Influenza A by PCR NEGATIVE NEGATIVE Final   Influenza B by PCR NEGATIVE NEGATIVE Final    Comment: (NOTE) The Xpert Xpress SARS-CoV-2/FLU/RSV plus assay is intended as an aid in the diagnosis of influenza from Nasopharyngeal swab specimens and should not be used as a sole basis for treatment. Nasal washings and aspirates are unacceptable for Xpert Xpress SARS-CoV-2/FLU/RSV testing.  Fact Sheet for Patients: [place]  Fact Sheet for Healthcare Providers: BloggerCourse.com  This test is not yet approved or cleared by the [employer]ious[employer]ker.it FDA and has been authorized for detection and/or  diagnosis of SARS-CoV-2 by FDA under an Emergency Use Authorization (EUA). This EUA will remain in effect (meaning this test can be used) for the duration of the COVID-19 declaration under Section 564(b)(1) of  the Act, 21 U.S.C. section 360bbb-3(b)(1), unless the authorization is terminated or revoked.  Performed at [facility], [address] [place], [place] [address]   MRSA PCR Screening     Status: None   Collection Time: 05/12/20  4:50 PM   Specimen: Nasal Mucosa; Nasopharyngeal  Result Value Ref Range Status   MRSA by PCR NEGATIVE NEGATIVE Final    Comment:        The GeneXpert MRSA Assay (FDA approved for NASAL specimens only), is one component of a comprehensive MRSA colonization surveillance program. It is not intended to diagnose MRSA infection nor to guide or monitor treatment for MRSA infections. Performed at [facility], [address], [place], [place] [address]   Culture, Urine     Status: None   Collection Time: 05/14/20  9:12 AM   Specimen: Urine, Random  Result Value Ref Range Status   Specimen Description URINE, RANDOM  Final   Special Requests NONE  Final   Culture   Final    NO GROWTH Performed at [facility], [address], [place], [place] [address]    Report Status 05/15/2020 FINAL  Final      Radiology Studies: No results found.     LOS: 10 days   Grigor Lipschutz [employer] on [email or web address]  05/22/2020, 8:54 AM

## 2020-05-23 DIAGNOSIS — R570 Cardiogenic shock: Secondary | ICD-10-CM | POA: Diagnosis not present

## 2020-05-23 DIAGNOSIS — I5022 Chronic systolic (congestive) heart failure: Secondary | ICD-10-CM | POA: Diagnosis not present

## 2020-05-23 DIAGNOSIS — E871 Hypo-osmolality and hyponatremia: Secondary | ICD-10-CM | POA: Diagnosis not present

## 2020-05-23 DIAGNOSIS — I5021 Acute systolic (congestive) heart failure: Secondary | ICD-10-CM | POA: Diagnosis not present

## 2020-05-23 LAB — BASIC METABOLIC PANEL
Anion gap: 9 (ref 5–15)
BUN: 26 mg/dL — ABNORMAL HIGH (ref 8–23)
CO2: 36 mmol/L — ABNORMAL HIGH (ref 22–32)
Calcium: 9.2 mg/dL (ref 8.9–10.3)
Chloride: 80 mmol/L — ABNORMAL LOW (ref 98–111)
Creatinine, Ser: 1.54 mg/dL — ABNORMAL HIGH (ref 0.44–1.00)
GFR, Estimated: 37 mL/min — ABNORMAL LOW (ref >60)
Glucose, Bld: 219 mg/dL — ABNORMAL HIGH (ref 70–99)
Potassium: 5 mmol/L (ref 3.5–5.1)
Sodium: 125 mmol/L — ABNORMAL LOW (ref 135–145)

## 2020-05-23 LAB — CBC
HCT: 24 % — ABNORMAL LOW (ref 36.0–46.0)
Hemoglobin: 7.9 g/dL — ABNORMAL LOW (ref 12.0–15.0)
MCH: 25.2 pg — ABNORMAL LOW (ref 26.0–34.0)
MCHC: 32.9 g/dL (ref 30.0–36.0)
MCV: 76.4 fL — ABNORMAL LOW (ref 80.0–100.0)
Platelets: 290 10*3/uL (ref 150–400)
RBC: 3.14 MIL/uL — ABNORMAL LOW (ref 3.87–5.11)
RDW: 15.9 % — ABNORMAL HIGH (ref 11.5–15.5)
WBC: 20.3 10*3/uL — ABNORMAL HIGH (ref 4.0–10.5)
nRBC: 0.3 % — ABNORMAL HIGH (ref 0.0–0.2)

## 2020-05-23 LAB — HEPATIC FUNCTION PANEL
ALT: 25 U/L (ref 0–44)
AST: 24 U/L (ref 15–41)
Albumin: 2.2 g/dL — ABNORMAL LOW (ref 3.5–5.0)
Alkaline Phosphatase: 151 U/L — ABNORMAL HIGH (ref 38–126)
Bilirubin, Direct: 0.3 mg/dL — ABNORMAL HIGH (ref 0.0–0.2)
Indirect Bilirubin: 0.8 mg/dL (ref 0.3–0.9)
Total Bilirubin: 1.1 mg/dL (ref 0.3–1.2)
Total Protein: 6.6 g/dL (ref 6.5–8.1)

## 2020-05-23 LAB — GLUCOSE, CAPILLARY
Glucose-Capillary: 192 mg/dL — ABNORMAL HIGH (ref 70–99)
Glucose-Capillary: 104 mg/dL — ABNORMAL HIGH (ref 70–99)
Glucose-Capillary: 222 mg/dL — ABNORMAL HIGH (ref 70–99)
Glucose-Capillary: 198 mg/dL — ABNORMAL HIGH (ref 70–99)

## 2020-05-23 MED ORDER — LIVING WELL WITH DIABETES BOOK
Freq: Once | Status: AC
  Filled 2020-05-23 (×2): qty 1

## 2020-05-23 MED ORDER — SODIUM CHLORIDE 0.9 % IV SOLN
510.0000 mg | Freq: Once | INTRAVENOUS | Status: AC
  Administered 2020-05-23: 510 mg via INTRAVENOUS
  Filled 2020-05-23: qty 17

## 2020-05-23 NOTE — Progress Notes (Signed)
[facility] PROGRESS NOTE   [name] [name] RWE:[id number] DOB: Nov 23, 1950 DOA: 05/12/2020  PCP: Dana Allan, MD  Brief History/Interval Summary: 70 year old lady with prior history of atrial fibrillation, chronic systolic heart failure with left ventricular ejection fraction of 25 to 30%, hypertension, hyperlipidemia, history of COVID-19 infection in the past  presented with left-sided weakness, required tPA and mechanical thrombectomy, had a cardiac arrest/PEA arrest immediately following her thrombectomy, underwent CPR for less than 5 minutes, intubated, started on pressors transferred to [facility] service.  Patient was extubated on 05/13/2020 and was weaned off vasopressors.  Patient also underwent TEE/DCCV on 05/19/20. She was transferred to [facility] on 05/20/2020.  Consultants:  Critical care medicine Heart failure team Palliative care  Procedures:  TEE/DC cardioversion on 5/9  Antibiotics: Anti-infectives (From admission, onward)   Start     Dose/Rate Route Frequency Ordered Stop   05/22/20 0915  cefTRIAXone (ROCEPHIN) 1 g in sodium chloride 0.9 % 100 mL IVPB        1 g 200 mL/hr over 30 Minutes Intravenous Every 24 hours 05/22/20 0825 05/25/20 0914      Subjective/Interval History: Overall patient mentions that she is feeling well.  Pain in the right side is well controlled.  Denies any shortness of breath currently.  Occasional cough.  No nausea vomiting.  Had a bowel movement yesterday.      Assessment/Plan:  Acute embolic CVA She is status post revascularization with tPA and mechanical thrombectomy.  Seen by neurology.  Seen by PT and OT.  Plan is for skilled nursing facility when medically stable. LDL 61.  HbA1c 10.0.  Noted to be on statin.  Patient was initially on IV heparin and has been transitioned to apixaban.  Acute on chronic systolic CHF/cardiogenic shock/PEA arrest Patient went into cardiac arrest after she underwent tPA and mechanical thrombectomy for acute  stroke.   Patient seen by heart failure team.  Patient was on milrinone infusion and on diuretics.  Subsequently taken off of milrinone.  Currently on digoxin and spironolactone.  It appears the plan was to initiate Entresto.   Patient's potassium and creatinine were are elevated yesterday.  Patient was given Lokelma.  Potassium down to 5.0.  Creatinine also slightly better.  Spironolactone has been discontinued by cardiology.  Hyponatremia Given tolvaptan 5/10.  Sodium level did improve some but appears to have plateaued.  Atrial fibrillation with RVR Noted to be on amiodarone.  Changed over to Eliquis.  Currently in sinus rhythm.  Diabetes mellitus type 2, uncontrolled with hyperglycemia HbA1c 10.5.  Patient currently on Lantus insulin and SSI.  Insulin dosage was adjusted due to low glucose levels yesterday morning.  Seems to be better now.  Continue to monitor.  May need further adjustment if remains hyperglycemic.   Leukocytosis/possible UTI Patient with persistent rise in WBC count without any clear-cut reason.  UA was abnormal.  Patient was unable to tell if she had any urinary symptoms.  She currently on a 3-day course of ceftriaxone.  Follow-up on urine cultures.  WBC stable this morning.  Remains afebrile.  Anemia of chronic disease Gradual drop in hemoglobin is noted.  No overt bleeding identified.  She had a brown stool yesterday.  She is on anticoagulation.  Looks like she was given iron infusion a few days ago.  Continue to trend.  Check LFTs today.  Could check stool for occult blood but if it is positive we will just need to monitor because she is currently not a candidate  for any endoscopic evaluation.  Bipolar disorder Outpatient follow-up.  Obesity Estimated body mass index is 33.31 kg/m as calculated from the following:   Height as of this encounter: 5\' 3"  (1.6 m).   Weight as of this encounter: 85.3 kg.   DVT Prophylaxis: Apixaban Code Status: Full code Family  Communication: Discussed with the patient.  No family at bedside Disposition Plan: SNF when medically stable  Status is: Inpatient  Remains inpatient appropriate because:IV treatments appropriate due to intensity of illness or inability to take PO   Dispo: The patient is from: Home              Anticipated d/c is to: SNF              Patient currently is not medically stable to d/c.   Difficult to place patient No         Medications:  Scheduled: . (feeding supplement) PROSource Plus  30 mL Oral BID BM  . amiodarone  200 mg Oral BID  . apixaban  5 mg Oral BID  . atorvastatin  80 mg Oral Daily  . Chlorhexidine Gluconate Cloth  6 each Topical Daily  . digoxin  0.125 mg Oral Daily  . famotidine  20 mg Oral Q2000  . feeding supplement  237 mL Oral BID BM  . insulin aspart  0-15 Units Subcutaneous TID WC  . insulin glargine  15 Units Subcutaneous QHS  . lidocaine  1 patch Transdermal Q24H  . multivitamin with minerals  1 tablet Oral Daily  . pantoprazole  40 mg Oral Daily  . polyethylene glycol  17 g Oral Daily  . senna-docusate  2 tablet Oral BID   Continuous: . sodium chloride Stopped (05/16/20 0600)  . cefTRIAXone (ROCEPHIN)  IV Stopped (05/22/20 2003)   07/22/20 chloride, bisacodyl, ondansetron (ZOFRAN) IV, oxyCODONE, simethicone   Objective:  Vital Signs  Vitals:   05/22/20 2351 05/23/20 0406 05/23/20 0636 05/23/20 0744  BP: (!) 146/64 (!) 155/77  (!) 148/92  Pulse: 97 97  97  Resp: 17 18  (!) 22  Temp: 98.8 F (37.1 C) 98.4 F (36.9 C)  98.1 F (36.7 C)  TempSrc: Oral Oral  Oral  SpO2: 100% 100%  100%  Weight:   85.3 kg   Height:        Intake/Output Summary (Last 24 hours) at 05/23/2020 0918 Last data filed at 05/23/2020 0300 Gross per 24 hour  Intake 100 ml  Output --  Net 100 ml   Filed Weights   05/21/20 0018 05/22/20 0000 05/23/20 0636  Weight: 84.5 kg 85.3 kg 85.3 kg    General appearance: Awake alert.  In no distress Resp:  Diminished air entry at the bases.  No wheezing or rhonchi. Cardio: S1-S2 is normal regular.  No S3-S4.  No rubs murmurs or bruit GI: Abdomen is soft.  Nontender nondistended.  Bowel sounds are present normal.  No masses organomegaly Extremities: Mild edema bilateral lower extremities Neurologic: Alert and oriented x3.  Mild left-sided deficits.     Lab Results:  Data Reviewed: I have personally reviewed following labs and imaging studies  CBC: Recent Labs  Lab 05/19/20 0956 05/20/20 0441 05/21/20 0501 05/22/20 0354 05/23/20 0155  WBC 10.0 13.3* 18.1* 20.8* 20.3*  HGB 11.1* 10.0* 9.6* 8.5* 7.9*  HCT 33.4* 29.4* 29.2* 25.5* 24.0*  MCV 74.9* 74.8* 77.0* 77.3* 76.4*  PLT 264 263 258 279 290    Basic Metabolic Panel: Recent Labs  Lab 05/17/20 0301  05/18/20 0416 05/19/20 0255 05/20/20 0441 05/20/20 2307 05/21/20 0501 05/22/20 0354 05/22/20 1220 05/23/20 0155  NA 128*   < > 124* 123* 122* 124* 126*  --  125*  K 3.5   < > 3.9 4.0  --  5.0 5.6* 5.3* 5.0  CL 80*   < > 70* 75*  --  78* 79*  --  80*  CO2 39*   < > 45* 40*  --  36* 38*  --  36*  GLUCOSE 100*   < > 116* 126*  --  168* 85  --  219*  BUN 15   < > 17 17  --  19 23  --  26*  CREATININE 1.23*   < > 1.51* 1.40*  --  1.43* 1.64*  --  1.54*  CALCIUM 8.9   < > 9.1 8.6*  --  9.0 9.5  --  9.2  MG 1.8  --  1.5* 2.0  --   --   --   --   --    < > = values in this interval not displayed.    GFR: Estimated Creatinine Clearance: 36.2 mL/min (A) (by C-G formula based on SCr of 1.54 mg/dL (H)).  Liver Function Tests: Recent Labs  Lab 05/20/20 0441  AST 37  ALT 37  ALKPHOS 194*  BILITOT 1.3*  PROT 6.1*  ALBUMIN 2.1*     Coagulation Profile: Recent Labs  Lab 05/19/20 1045  INR 1.3*    CBG: Recent Labs  Lab 05/22/20 0811 05/22/20 1223 05/22/20 1522 05/22/20 2117 05/23/20 0635  GLUCAP 72 165* 145* 263* 192*      Recent Results (from the past 240 hour(s))  Culture, Urine     Status: None    Collection Time: 05/14/20  9:12 AM   Specimen: Urine, Random  Result Value Ref Range Status   Specimen Description URINE, RANDOM  Final   Special Requests NONE  Final   Culture   Final    NO GROWTH Performed at [facility], [address] [place], [place] [address]    Report Status 05/15/2020 FINAL  Final      Radiology Studies: No results found.     LOS: 11 days   Sharmain Lastra [employer] on [email or web address]  05/23/2020, 9:18 AM

## 2020-05-23 NOTE — TOC Progression Note (Addendum)
Transition of Care (TOC) - Progression Note  Heart Failure  Patient Details  Name: [name] [name] MRN: [id number] Date of Birth: [birth year]  Transition of Care [facility]) CM/SW Contact  Sheniqua Carolan, LCSWA Phone Number: 05/23/2020, 8:59 AM  Clinical Narrative:    CSW received insurance authorization from [place] [facility] for 5 days at [place] starting on 05/22/2020 - 05/26/2020 authorization number #[id number]. Case manager Octaviano Glow [phone] fax number [phone].   CSW spoke with the patients daughters [name] [phone] and [name] [phone] and updated them about receiving the insurance authorization approval for 5 days at [place]. [name] asked for her mom to receive the COVID booster before being discharged to [facility]. Per MD the patient isn't ready for discharge today and to wait on getting the COVID booster due to her other medical concerns.  CSW will keep the family updated regarding patients disposition. TOC will continue to follow through discharge.    Expected Discharge Plan: Skilled Nursing Facility Barriers to Discharge: Continued Medical Work up  Expected Discharge Plan and Services Expected Discharge Plan: Skilled Nursing Facility In-house Referral: Clinical Social Work Discharge Planning Services: CM Consult Post Acute Care Choice: Skilled Nursing Facility,IP Rehab (Patient and her daughter really want to go to CIR instead of SNF) Living arrangements for the past 2 months: Apartment                                       Social Determinants of Health (SDOH) Interventions Food Insecurity Interventions: Intervention Not Indicated Financial Strain Interventions: Intervention Not Indicated Housing Interventions: Intervention Not Indicated Transportation Interventions: Intervention Not Indicated,Other (Comment) (Patient reported relying on family for tranpsortation needs)  Readmission Risk Interventions Readmission Risk Prevention Plan 02/07/2020  Post  Dischage Appt Complete  Medication Screening Complete  Transportation Screening Complete  Some recent data might be hidden   Anitta Tenny, MSW, LCSWA [phone] Heart Failure Social Worker

## 2020-05-23 NOTE — Progress Notes (Addendum)
Inpatient Diabetes Program Recommendations  AACE/ADA: New Consensus Statement on Inpatient Glycemic Control (2015)  Target Ranges:  Prepandial:   less than 140 mg/dL      Peak postprandial:   less than 180 mg/dL (1-2 hours)      Critically ill patients:  140 - 180 mg/dL   Lab Results  Component Value Date   GLUCAP 192 (H) 05/23/2020   HGBA1C 10.5 (H) 05/13/2020    Review of Glycemic Control  Diabetes history: DM 2 Outpatient Diabetes medications: Lantus 20 units, Ozempic 0.25 mg weekly Current orders for Inpatient glycemic control:  Lantus 15 units qhs Novolog 0-15 units tid  Ensure Enlive bid between meals  A1c 10.5  Inpatient Diabetes Program Recommendations:    - consider adding Novolog 2 units tid meal coverage if eating >50% of meals. Glucose spikes also due to Ensure.  Spoke with pt over the phone regarding A1c and glucose control at. Pt reports taking medications consistently. Pt does not have a meter or checks her glucose home. She does not take pills due to swallowing difficulty. Pt reports her daughters buys her food for her.  spoke with daughter on phone Hulen Skains) to discuss options for pt diet at home and to make sure pt checks her glucose every day. Pt on independent living. Pt drinks regular sprites at home and cream and sugar in her coffee.  Daughter informed me that pt was on freestyle libre at one time. Insurance stopped covering for it. Discussed pre-authorization forms needed to potentially continue the sensor for glucose checks.  Breakfast 1-2 eggs a day, maybe bacon Fruits strawberries Ice Water  Pt needs a glucose meter at time of d/c: Blood glucose meter kit order #[id number]  Thanks,  Tama Headings RN, MSN, BC-ADM Inpatient Diabetes Coordinator Team Pager [phone] (8a-5p)

## 2020-05-23 NOTE — Progress Notes (Addendum)
Patient ID: [name] [name], female   DOB: 20-Dec-1950, 70 y.o.   MRN: [id number]     Advanced Heart Failure Rounding Note  PCP-Cardiologist: Kardie Tobb, DO   Subjective:    5/5 Milrinone increased to 0.25 mcg. Off norepi. 5/8 Lasix drip stopped. Continue on milrinone 0.125 mcg.  5/9 S/P TEE/DC-CV -->NSR.  5/10 switched to po amio . Given tolvaptan. Orthostatic with PT.   Off diuretics/ BP active meds due to orthostasis. Wt stable at 188 lb.   SBPs now upper 140s-160s (I have d/w RN, will check orthostatic VS)  Off sprio w/ hyperkalemia. K 5.6>>5.2>>5.0   Remains in NSR on tele.   Hgb 7.9. Denies melena/ hematochezia.   WBC remains elevated but stable past 24 hr and AF. On abx for UTI.   Objective:   Weight Range: 85.3 kg Body mass index is 33.31 kg/m.   Vital Signs:   Temp:  [98 F (36.7 C)-98.8 F (37.1 C)] 98.1 F (36.7 C) (05/13 0744) Pulse Rate:  [97] 97 (05/13 0744) Resp:  [17-22] 22 (05/13 0744) BP: (145-155)/(64-92) 148/92 (05/13 0744) SpO2:  [100 %] 100 % (05/13 0744) Weight:  [85.3 kg] 85.3 kg (05/13 0636) Last BM Date:  (no BM yet, Miralax and Senna Given)  Weight change: Filed Weights   05/21/20 0018 05/22/20 0000 05/23/20 0636  Weight: 84.5 kg 85.3 kg 85.3 kg    Intake/Output:   Intake/Output Summary (Last 24 hours) at 05/23/2020 1148 Last data filed at 05/23/2020 0300 Gross per 24 hour  Intake 100 ml  Output --  Net 100 ml      Physical Exam   PHYSICAL EXAM: General:  Chronically ill appearing. No respiratory difficulty HEENT: normal Neck: supple. no JVD. Carotids 2+ bilat; no bruits. No lymphadenopathy or thyromegaly appreciated. Cor: PMI nondisplaced. Regular rate & rhythm. No rubs, gallops or murmurs. Lungs: clear Abdomen: soft, nontender, nondistended. No hepatosplenomegaly. No bruits or masses. Good bowel sounds. Extremities: no cyanosis, clubbing, rash, edema Neuro: alert & oriented x 3, cranial nerves grossly intact. moves all  4 extremities w/o difficulty. Affect pleasant.   Telemetry   NSR 80-90s Personally reviewed   Labs    CBC Recent Labs    05/22/20 0354 05/23/20 0155  WBC 20.8* 20.3*  HGB 8.5* 7.9*  HCT 25.5* 24.0*  MCV 77.3* 76.4*  PLT 279 290   Basic Metabolic Panel Recent Labs    40/98/11 0354 05/22/20 1220 05/23/20 0155  NA 126*  --  125*  K 5.6* 5.3* 5.0  CL 79*  --  80*  CO2 38*  --  36*  GLUCOSE 85  --  219*  BUN 23  --  26*  CREATININE 1.64*  --  1.54*  CALCIUM 9.5  --  9.2   Liver Function Tests No results for input(s): AST, ALT, ALKPHOS, BILITOT, PROT, ALBUMIN in the last 72 hours. No results for input(s): LIPASE, AMYLASE in the last 72 hours. Cardiac Enzymes No results for input(s): CKTOTAL, CKMB, CKMBINDEX, TROPONINI in the last 72 hours.  BNP: BNP (last 3 results) Recent Labs    03/03/20 1559  BNP 1,981.9*    ProBNP (last 3 results) No results for input(s): PROBNP in the last 8760 hours.   D-Dimer No results for input(s): DDIMER in the last 72 hours. Hemoglobin A1C No results for input(s): HGBA1C in the last 72 hours. Fasting Lipid Panel No results for input(s): CHOL, HDL, LDLCALC, TRIG, CHOLHDL, LDLDIRECT in the last 72 hours. Thyroid Function Tests  No results for input(s): TSH, T4TOTAL, T3FREE, THYROIDAB in the last 72 hours.  Invalid input(s): FREET3  Other results:   Imaging    No results found.   Medications:     Scheduled Medications: . (feeding supplement) PROSource Plus  30 mL Oral BID BM  . amiodarone  200 mg Oral BID  . apixaban  5 mg Oral BID  . atorvastatin  80 mg Oral Daily  . Chlorhexidine Gluconate Cloth  6 each Topical Daily  . digoxin  0.125 mg Oral Daily  . famotidine  20 mg Oral Q2000  . feeding supplement  237 mL Oral BID BM  . insulin aspart  0-15 Units Subcutaneous TID WC  . insulin glargine  15 Units Subcutaneous QHS  . lidocaine  1 patch Transdermal Q24H  . living well with diabetes book   Does not apply Once   . multivitamin with minerals  1 tablet Oral Daily  . pantoprazole  40 mg Oral Daily  . polyethylene glycol  17 g Oral Daily  . senna-docusate  2 tablet Oral BID    Infusions: . sodium chloride Stopped (05/16/20 0600)  . cefTRIAXone (ROCEPHIN)  IV 1 g (05/23/20 0925)    PRN Medications: sodium chloride, bisacodyl, ondansetron (ZOFRAN) IV, oxyCODONE, simethicone    Assessment/Plan   1. 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 Hold diuretics. Orthostatic 5/10 with PT.  - off metoprolol -- patient says she is allergic though she was on this prior to admit.  - Hold off on entresto or farxiga with orthostasis.  - continue digoxin  - Off spiro w/ hyperkalemia - Will need to consider losartan prior to d/c (if hyperkalemia better) to get her on some GDMT  - RN to check orthostatic VS again today. If negative, can try low dose hydralazine 10 tid   2. AF with RVR/atrial tach - amiodarone for rate/rhythm control -  AT =>  atrial tachy with 2:1 block.   - 5/9 S/P TEE DC-CV -->NSR.  - Maintaining NSR. Continue amio 200 mg twice a day.  - Continue Eliquis 5 bid  3. Acute embolic CVA  - s/p successful revascularization with systemic T-PA and mechanical thrombectomy - no clinical residual and no significant defect on MRI - Continue ELiquis - Continue PT/OT--recommending SNF  4. Non-compliance - encouraged her to work with PT  5. Bipolar d/o - will need outpatient f/u w/ mental health   6. Poorly controlled DM2 - hgb A1c 10.5  - insulin per primary team   7. PEA arrest - Had brief CPR and intubated. - On 2 liters [place].   8. Moderate Pericardial Effusion w/o Tamponade  - Eventual repeat echo.   9. Leukocytosis - WBC 20K. AF. Suspect due to UTI  - on ceftriaxone  10. Anemia - Hgb continues to trend down, 7.9 today  - got feraheme 5/6.  Will give 2nd dose today  - may be contributing to orthostasis ? 1Unit of blood - check FOBT    11. Hyponatremia - Na 125 - ? tolvaptan  - monitor    SW following for placement.   Length of Stay: [address], PA-C  05/23/2020, 11:48 AM  Advanced Heart Failure Team Pager [phone] (M-F; 7a - 5p)  Please contact [facility] for night-coverage after hours (5p -7a ) and weekends on [email or web address]   Patient seen and  examined with the above-signed Advanced Practice Provider and/or Housestaff. I personally reviewed laboratory data, imaging studies and relevant notes. I independently examined the patient and formulated the important aspects of the plan. I have edited the note to reflect any of my changes or salient points. I have personally discussed the plan with the patient and/or family.  Feels ok. Maintaining NSR. Denies SOB, orthopnea or PND. HF meds on hold due to hyperkalemia and orthostasis.  Remains in NSR on amo and eliquis  General:  Weak appearing. No resp difficulty HEENT: normal Neck: supple. no JVD. Carotids 2+ bilat; no bruits. No lymphadenopathy or thryomegaly appreciated. Cor: PMI nondisplaced. Regular rate & rhythm. No rubs, gallops or murmurs. Lungs: clear Abdomen: soft, nontender, nondistended. No hepatosplenomegaly. No bruits or masses. Good bowel sounds. Extremities: no cyanosis, clubbing, rash, edema Neuro: alert & orientedx3, cranial nerves grossly intact. moves all 4 extremities w/o difficulty. Affect pleasant  Agree with plan as above. Add TED hose to help with orthostasis. Agree with low-dose hydral/nitrates as tolerated.   HF team will see again Monday. Please call over the w/e with questions.   Arvilla Meres, MD  12:52 PM

## 2020-05-24 DIAGNOSIS — I5021 Acute systolic (congestive) heart failure: Secondary | ICD-10-CM | POA: Diagnosis not present

## 2020-05-24 DIAGNOSIS — E871 Hypo-osmolality and hyponatremia: Secondary | ICD-10-CM | POA: Diagnosis not present

## 2020-05-24 DIAGNOSIS — E875 Hyperkalemia: Secondary | ICD-10-CM

## 2020-05-24 LAB — BASIC METABOLIC PANEL
Anion gap: 11 (ref 5–15)
BUN: 30 mg/dL — ABNORMAL HIGH (ref 8–23)
CO2: 33 mmol/L — ABNORMAL HIGH (ref 22–32)
Calcium: 9.3 mg/dL (ref 8.9–10.3)
Chloride: 83 mmol/L — ABNORMAL LOW (ref 98–111)
Creatinine, Ser: 1.43 mg/dL — ABNORMAL HIGH (ref 0.44–1.00)
GFR, Estimated: 40 mL/min — ABNORMAL LOW (ref >60)
Glucose, Bld: 163 mg/dL — ABNORMAL HIGH (ref 70–99)
Potassium: 5.4 mmol/L — ABNORMAL HIGH (ref 3.5–5.1)
Sodium: 127 mmol/L — ABNORMAL LOW (ref 135–145)

## 2020-05-24 LAB — URINE CULTURE: Culture: 80000 — AB

## 2020-05-24 LAB — GLUCOSE, CAPILLARY
Glucose-Capillary: 152 mg/dL — ABNORMAL HIGH (ref 70–99)
Glucose-Capillary: 217 mg/dL — ABNORMAL HIGH (ref 70–99)
Glucose-Capillary: 180 mg/dL — ABNORMAL HIGH (ref 70–99)
Glucose-Capillary: 164 mg/dL — ABNORMAL HIGH (ref 70–99)

## 2020-05-24 LAB — CBC
HCT: 25 % — ABNORMAL LOW (ref 36.0–46.0)
Hemoglobin: 8.3 g/dL — ABNORMAL LOW (ref 12.0–15.0)
MCH: 26.3 pg (ref 26.0–34.0)
MCHC: 33.2 g/dL (ref 30.0–36.0)
MCV: 79.4 fL — ABNORMAL LOW (ref 80.0–100.0)
Platelets: 258 10*3/uL (ref 150–400)
RBC: 3.15 MIL/uL — ABNORMAL LOW (ref 3.87–5.11)
RDW: 16.9 % — ABNORMAL HIGH (ref 11.5–15.5)
WBC: 19.4 10*3/uL — ABNORMAL HIGH (ref 4.0–10.5)
nRBC: 0.5 % — ABNORMAL HIGH (ref 0.0–0.2)

## 2020-05-24 MED ORDER — SODIUM ZIRCONIUM CYCLOSILICATE 10 G PO PACK
10.0000 g | PACK | Freq: Three times a day (TID) | ORAL | Status: AC
  Administered 2020-05-24 (×2): 10 g via ORAL
  Filled 2020-05-24 (×2): qty 1

## 2020-05-24 MED ORDER — FUROSEMIDE 10 MG/ML IJ SOLN
60.0000 mg | Freq: Once | INTRAMUSCULAR | Status: AC
  Administered 2020-05-24: 60 mg via INTRAVENOUS
  Filled 2020-05-24: qty 8

## 2020-05-24 MED ORDER — SODIUM CHLORIDE 0.9 % IV SOLN: 1.0000 g | INTRAVENOUS | Status: DC

## 2020-05-24 MED ORDER — SODIUM CHLORIDE 0.9 % IV SOLN
2.0000 g | Freq: Two times a day (BID) | INTRAVENOUS | Status: DC
  Administered 2020-05-24 – 2020-05-26 (×4): 2 g via INTRAVENOUS
  Filled 2020-05-24 (×7): qty 2

## 2020-05-24 MED ORDER — SODIUM CHLORIDE 0.9 % IV SOLN: 2.0000 g | Freq: Two times a day (BID) | INTRAVENOUS | Status: DC

## 2020-05-24 NOTE — Progress Notes (Signed)
[facility] PROGRESS NOTE   [name] [name] IRJ:[id number] DOB: May 11, 1950 DOA: 05/12/2020  PCP: Dana Allan, MD  Brief History/Interval Summary: 70 year old lady with prior history of atrial fibrillation, chronic systolic heart failure with left ventricular ejection fraction of 25 to 30%, hypertension, hyperlipidemia, history of COVID-19 infection in the past  presented with left-sided weakness, required tPA and mechanical thrombectomy, had a cardiac arrest/PEA arrest immediately following her thrombectomy, underwent CPR for less than 5 minutes, intubated, started on pressors transferred to [facility] service.  Patient was extubated on 05/13/2020 and was weaned off vasopressors.  Patient also underwent TEE/DCCV on 05/19/20. She was transferred to [facility] on 05/20/2020.  Consultants:  Critical care medicine Heart failure team Palliative care  Procedures:  TEE/DC cardioversion on 5/9  Antibiotics: Anti-infectives (From admission, onward)   Start     Dose/Rate Route Frequency Ordered Stop   05/22/20 0915  cefTRIAXone (ROCEPHIN) 1 g in sodium chloride 0.9 % 100 mL IVPB        1 g 200 mL/hr over 30 Minutes Intravenous Every 24 hours 05/22/20 0825 05/24/20 0850      Subjective/Interval History: Patient mentions that she is a bit more short of breath this morning.  Denies any pain on the right side today.  Occasional cough.  No nausea vomiting.      Assessment/Plan:  Acute embolic CVA She is status post revascularization with tPA and mechanical thrombectomy.  Seen by neurology.  Seen by PT and OT.  Plan is for skilled nursing facility when medically stable. LDL 61.  HbA1c 10.0.  Noted to be on statin.   Patient was initially on IV heparin and has been transitioned to apixaban.  Acute on chronic systolic CHF/cardiogenic shock/PEA arrest Patient went into cardiac arrest after she underwent tPA and mechanical thrombectomy for acute stroke.   Patient seen by heart failure team.  Patient was  on milrinone infusion and on diuretics.  Subsequently taken off of milrinone.  Remains on digoxin.  Spironolactone was discontinued due to hyperkalemia. Patient feels a little bit more dyspneic today.  We will give her a dose of furosemide intravenously.  Hyperkalemia Potassium level again noted to be elevated today.  Will give additional doses of Lokelma.  Recheck labs tomorrow.    Hyponatremia Given tolvaptan 5/10.  Sodium level has improved.  Noted to be 127 today.  Continue to monitor for now.  Atrial fibrillation with RVR Noted to be on amiodarone.  Changed over to Eliquis.  Currently in sinus rhythm.  Diabetes mellitus type 2, uncontrolled with hyperglycemia HbA1c 10.5.  Patient currently on Lantus insulin and SSI.  Insulin dose has been adjusted based on glucose levels.  Dose was decreased a few days ago due to episodes of hypoglycemia.  Leukocytosis/possible UTI Patient with persistent rise in WBC count without any clear-cut reason.  UA was abnormal.  Patient was unable to tell if she had any urinary symptoms.   Started on ceftriaxone for 3 days.  Urine cultures growing insignificant amount of Pseudomonas and Klebsiella.  WBC slightly better.  Will extend the treatment to total of 5 days.    Anemia of chronic disease Gradual drop in hemoglobin is noted.  No overt bleeding identified.  She has had brown stool.  Stool for occult blood testing has been ordered.  Not done yet.  Hemoglobin stable this morning.  Looks like she was given another dose of iron yesterday by cardiology.  Anemia panel checked on 5/6 showed ferritin to be 124, iron  20, TIBC 305, folate level 11.2.  B12 level was 2001.  Bipolar disorder Outpatient follow-up.  Obesity Estimated body mass index is 33.31 kg/m as calculated from the following:   Height as of this encounter: 5\' 3"  (1.6 m).   Weight as of this encounter: 85.3 kg.   DVT Prophylaxis: Apixaban Code Status: Full code Family Communication: Discussed  with the patient.  No family at bedside Disposition Plan: SNF when medically stable  Status is: Inpatient  Remains inpatient appropriate because:IV treatments appropriate due to intensity of illness or inability to take PO   Dispo: The patient is from: Home              Anticipated d/c is to: SNF              Patient currently is not medically stable to d/c.   Difficult to place patient No         Medications:  Scheduled: . (feeding supplement) PROSource Plus  30 mL Oral BID BM  . amiodarone  200 mg Oral BID  . apixaban  5 mg Oral BID  . atorvastatin  80 mg Oral Daily  . Chlorhexidine Gluconate Cloth  6 each Topical Daily  . digoxin  0.125 mg Oral Daily  . famotidine  20 mg Oral Q2000  . feeding supplement  237 mL Oral BID BM  . insulin aspart  0-15 Units Subcutaneous TID WC  . insulin glargine  15 Units Subcutaneous QHS  . lidocaine  1 patch Transdermal Q24H  . multivitamin with minerals  1 tablet Oral Daily  . pantoprazole  40 mg Oral Daily  . polyethylene glycol  17 g Oral Daily  . senna-docusate  2 tablet Oral BID  . sodium zirconium cyclosilicate  10 g Oral TID   Continuous: . sodium chloride Stopped (05/16/20 0600)   07/16/20 chloride, bisacodyl, ondansetron (ZOFRAN) IV, oxyCODONE, simethicone   Objective:  Vital Signs  Vitals:   05/23/20 1937 05/24/20 0004 05/24/20 0313 05/24/20 0817  BP: (!) 150/71 (!) 163/77 (!) 164/78 (!) 145/79  Pulse: 97 98 99 98  Resp: (!) 21 (!) 21 (!) 24 (!) 22  Temp: 98.4 F (36.9 C) 98.5 F (36.9 C) 98.3 F (36.8 C) 98 F (36.7 C)  TempSrc: Oral Oral Oral Oral  SpO2: 96% 100% 98% 99%  Weight:      Height:        Intake/Output Summary (Last 24 hours) at 05/24/2020 1013 Last data filed at 05/24/2020 05/26/2020 Gross per 24 hour  Intake 317 ml  Output 650 ml  Net -333 ml   Filed Weights   05/21/20 0018 05/22/20 0000 05/23/20 0636  Weight: 84.5 kg 85.3 kg 85.3 kg    General appearance: Awake alert.  In no  distress Resp: Noted to be mildly tachypneic today.  Crackles bilateral bases.  No wheezing or rhonchi. Cardio: S1-S2 is normal regular.  No S3-S4.  No rubs murmurs or bruit GI: Abdomen is soft.  Nontender nondistended.  Bowel sounds are present normal.  No masses organomegaly Extremities: Mild edema bilateral lower extremities     Lab Results:  Data Reviewed: I have personally reviewed following labs and imaging studies  CBC: Recent Labs  Lab 05/20/20 0441 05/21/20 0501 05/22/20 0354 05/23/20 0155 05/24/20 0311  WBC 13.3* 18.1* 20.8* 20.3* 19.4*  HGB 10.0* 9.6* 8.5* 7.9* 8.3*  HCT 29.4* 29.2* 25.5* 24.0* 25.0*  MCV 74.8* 77.0* 77.3* 76.4* 79.4*  PLT 263 258 279 290 258  Basic Metabolic Panel: Recent Labs  Lab 05/19/20 0255 05/20/20 0441 05/20/20 2307 05/21/20 0501 05/22/20 0354 05/22/20 1220 05/23/20 0155 05/24/20 0311  NA 124* 123* 122* 124* 126*  --  125* 127*  K 3.9 4.0  --  5.0 5.6* 5.3* 5.0 5.4*  CL 70* 75*  --  78* 79*  --  80* 83*  CO2 45* 40*  --  36* 38*  --  36* 33*  GLUCOSE 116* 126*  --  168* 85  --  219* 163*  BUN 17 17  --  19 23  --  26* 30*  CREATININE 1.51* 1.40*  --  1.43* 1.64*  --  1.54* 1.43*  CALCIUM 9.1 8.6*  --  9.0 9.5  --  9.2 9.3  MG 1.5* 2.0  --   --   --   --   --   --     GFR: Estimated Creatinine Clearance: 39 mL/min (A) (by C-G formula based on SCr of 1.43 mg/dL (H)).  Liver Function Tests: Recent Labs  Lab 05/20/20 0441 05/23/20 0155  AST 37 24  ALT 37 25  ALKPHOS 194* 151*  BILITOT 1.3* 1.1  PROT 6.1* 6.6  ALBUMIN 2.1* 2.2*     Coagulation Profile: Recent Labs  Lab 05/19/20 1045  INR 1.3*    CBG: Recent Labs  Lab 05/23/20 0635 05/23/20 1203 05/23/20 1609 05/23/20 2111 05/24/20 0617  GLUCAP 192* 104* 222* 198* 152*      Recent Results (from the past 240 hour(s))  Culture, Urine     Status: Abnormal   Collection Time: 05/22/20  8:26 AM   Specimen: Urine, Random  Result Value Ref Range Status    Specimen Description URINE, RANDOM  Final   Special Requests   Final    NONE Performed at [facility], [address], [place], [place] [address]    Culture (A)  Final    80,000 COLONIES/mL PSEUDOMONAS AERUGINOSA 70,000 COLONIES/mL KLEBSIELLA PNEUMONIAE    Report Status 05/24/2020 FINAL  Final   Organism ID, Bacteria PSEUDOMONAS AERUGINOSA (A)  Final   Organism ID, Bacteria KLEBSIELLA PNEUMONIAE (A)  Final      Susceptibility   Klebsiella pneumoniae - MIC*    AMPICILLIN >=32 RESISTANT Resistant     CEFAZOLIN <=4 SENSITIVE Sensitive     CEFEPIME <=0.12 SENSITIVE Sensitive     CEFTRIAXONE <=0.25 SENSITIVE Sensitive     CIPROFLOXACIN <=0.25 SENSITIVE Sensitive     GENTAMICIN <=1 SENSITIVE Sensitive     IMIPENEM <=0.25 SENSITIVE Sensitive     NITROFURANTOIN 128 RESISTANT Resistant     TRIMETH/SULFA <=20 SENSITIVE Sensitive     AMPICILLIN/SULBACTAM 4 SENSITIVE Sensitive     PIP/TAZO <=4 SENSITIVE Sensitive     * 70,000 COLONIES/mL KLEBSIELLA PNEUMONIAE   Pseudomonas aeruginosa - MIC*    CEFTAZIDIME 4 SENSITIVE Sensitive     CIPROFLOXACIN <=0.25 SENSITIVE Sensitive     GENTAMICIN 2 SENSITIVE Sensitive     IMIPENEM 1 SENSITIVE Sensitive     PIP/TAZO 8 SENSITIVE Sensitive     CEFEPIME 2 SENSITIVE Sensitive     * 80,000 COLONIES/mL PSEUDOMONAS AERUGINOSA      Radiology Studies: No results found.     LOS: 12 days   Norlan Rann [employer] on [email or web address]  05/24/2020, 10:13 AM

## 2020-05-25 DIAGNOSIS — E875 Hyperkalemia: Secondary | ICD-10-CM | POA: Diagnosis not present

## 2020-05-25 DIAGNOSIS — E871 Hypo-osmolality and hyponatremia: Secondary | ICD-10-CM | POA: Diagnosis not present

## 2020-05-25 DIAGNOSIS — I5021 Acute systolic (congestive) heart failure: Secondary | ICD-10-CM | POA: Diagnosis not present

## 2020-05-25 DIAGNOSIS — D72829 Elevated white blood cell count, unspecified: Secondary | ICD-10-CM

## 2020-05-25 LAB — CBC
HCT: 25.6 % — ABNORMAL LOW (ref 36.0–46.0)
Hemoglobin: 8.3 g/dL — ABNORMAL LOW (ref 12.0–15.0)
MCH: 25.8 pg — ABNORMAL LOW (ref 26.0–34.0)
MCHC: 32.4 g/dL (ref 30.0–36.0)
MCV: 79.5 fL — ABNORMAL LOW (ref 80.0–100.0)
Platelets: 312 10*3/uL (ref 150–400)
RBC: 3.22 MIL/uL — ABNORMAL LOW (ref 3.87–5.11)
RDW: 17.9 % — ABNORMAL HIGH (ref 11.5–15.5)
WBC: 21.8 10*3/uL — ABNORMAL HIGH (ref 4.0–10.5)
nRBC: 0.3 % — ABNORMAL HIGH (ref 0.0–0.2)

## 2020-05-25 LAB — GLUCOSE, CAPILLARY
Glucose-Capillary: 172 mg/dL — ABNORMAL HIGH (ref 70–99)
Glucose-Capillary: 203 mg/dL — ABNORMAL HIGH (ref 70–99)
Glucose-Capillary: 174 mg/dL — ABNORMAL HIGH (ref 70–99)
Glucose-Capillary: 242 mg/dL — ABNORMAL HIGH (ref 70–99)

## 2020-05-25 LAB — BASIC METABOLIC PANEL
Anion gap: 10 (ref 5–15)
BUN: 32 mg/dL — ABNORMAL HIGH (ref 8–23)
CO2: 33 mmol/L — ABNORMAL HIGH (ref 22–32)
Calcium: 9.2 mg/dL (ref 8.9–10.3)
Chloride: 84 mmol/L — ABNORMAL LOW (ref 98–111)
Creatinine, Ser: 1.44 mg/dL — ABNORMAL HIGH (ref 0.44–1.00)
GFR, Estimated: 40 mL/min — ABNORMAL LOW (ref >60)
Glucose, Bld: 170 mg/dL — ABNORMAL HIGH (ref 70–99)
Potassium: 5.2 mmol/L — ABNORMAL HIGH (ref 3.5–5.1)
Sodium: 127 mmol/L — ABNORMAL LOW (ref 135–145)

## 2020-05-25 MED ORDER — SODIUM ZIRCONIUM CYCLOSILICATE 10 G PO PACK
10.0000 g | PACK | Freq: Three times a day (TID) | ORAL | Status: AC
  Administered 2020-05-25 (×2): 10 g via ORAL
  Filled 2020-05-25 (×2): qty 1

## 2020-05-25 NOTE — Progress Notes (Signed)
[facility] PROGRESS NOTE   [name] [name] VAP:[id number] DOB: 01-Jan-1951 DOA: 05/12/2020  PCP: Dana Allan, MD  Brief History/Interval Summary: 70 year old lady with prior history of atrial fibrillation, chronic systolic heart failure with left ventricular ejection fraction of 25 to 30%, hypertension, hyperlipidemia, history of COVID-19 infection in the past  presented with left-sided weakness, required tPA and mechanical thrombectomy, had a cardiac arrest/PEA arrest immediately following her thrombectomy, underwent CPR for less than 5 minutes, intubated, started on pressors transferred to [facility] service.  Patient was extubated on 05/13/2020 and was weaned off vasopressors.  Patient also underwent TEE/DCCV on 05/19/20. She was transferred to [facility] on 05/20/2020.  Consultants:  Critical care medicine Heart failure team Palliative care  Procedures:  TEE/DC cardioversion on 5/9  Antibiotics: Anti-infectives (From admission, onward)   Start     Dose/Rate Route Frequency Ordered Stop   05/25/20 1000  cefTRIAXone (ROCEPHIN) 1 g in sodium chloride 0.9 % 100 mL IVPB  Status:  Discontinued        1 g 200 mL/hr over 30 Minutes Intravenous Every 24 hours 05/24/20 1021 05/24/20 1410   05/24/20 1800  ceFEPIme (MAXIPIME) 2 g in sodium chloride 0.9 % 100 mL IVPB        2 g 200 mL/hr over 30 Minutes Intravenous Every 12 hours 05/24/20 1542     05/24/20 1500  ceFEPIme (MAXIPIME) 2 g in sodium chloride 0.9 % 100 mL IVPB  Status:  Discontinued        2 g 200 mL/hr over 30 Minutes Intravenous Every 12 hours 05/24/20 1410 05/24/20 1542   05/22/20 0915  cefTRIAXone (ROCEPHIN) 1 g in sodium chloride 0.9 % 100 mL IVPB        1 g 200 mL/hr over 30 Minutes Intravenous Every 24 hours 05/22/20 0825 05/24/20 0850      Subjective/Interval History: Patient mentions that she is feeling better this morning.  Less short of breath.  Denies any chest pain.  No other complaints offered.       Assessment/Plan:  Acute embolic CVA She is status post revascularization with tPA and mechanical thrombectomy.  Seen by neurology.  Seen by PT and OT.  Plan is for skilled nursing facility when medically stable. LDL 61.  HbA1c 10.0.  Noted to be on statin.   Patient was initially on IV heparin and has been transitioned to apixaban.  Acute on chronic systolic CHF/cardiogenic shock/PEA arrest Patient went into cardiac arrest after she underwent tPA and mechanical thrombectomy for acute stroke.   Patient seen by heart failure team.  Patient was on milrinone infusion and on diuretics.  Subsequently taken off of milrinone.  Remains on digoxin.  Spironolactone was discontinued due to hyperkalemia. He was dyspneic yesterday and was given a dose of Lasix.  Symptoms have improved.  Hold off on further doses of Lasix for now.  Heart failure team will be back tomorrow.  Hyperkalemia Improvement in potassium level noted today after she was given [facility] yesterday.  We will give additional doses.     Hyponatremia Given tolvaptan 5/10.  Sodium level has improved.  Remains 127 this morning.  Continue to monitor for now.    Atrial fibrillation with RVR Stable.  Currently in sinus rhythm.  Remains on amiodarone and anticoagulated with Eliquis.    Diabetes mellitus type 2, uncontrolled with hyperglycemia HbA1c 10.5.  Patient currently on Lantus insulin and SSI.  Insulin dose has been adjusted based on glucose levels.  Dose was decreased a  few days ago due to episodes of hypoglycemia.  CBGs are stable.  Leukocytosis/possible UTI Patient with persistent rise in WBC count without any clear-cut reason.  UA was abnormal.  Patient was unable to tell if she had any urinary symptoms.  Plan was to give her ceftriaxone for 3 days.  Cultures growing Pseudomonas and Klebsiella although less than 100,000 colonies.  Cefepime initiated yesterday to be given for 2 more days.  She remains afebrile.  No other source of  infection noted.  Leukocytosis could be reactive.  We will check another differential tomorrow morning with peripheral smear.  Anemia of chronic disease Gradual drop in hemoglobin was noted.  No overt bleeding identified.  She has had brown stool.  Stool for occult blood testing has been ordered.  Not done yet.   Patient was given 2 doses of Feraheme over the last 1 week or so.  Hemoglobin noted to be stable. Anemia panel checked on 5/6 showed ferritin to be 124, iron 20, TIBC 305, folate level 11.2.  B12 level was 2001. Will need further work-up in the outpatient setting once she is over her acute illness.  Bipolar disorder Outpatient follow-up.  Obesity Estimated body mass index is 33.31 kg/m as calculated from the following:   Height as of this encounter: 5\' 3"  (1.6 m).   Weight as of this encounter: 85.3 kg.   DVT Prophylaxis: Apixaban Code Status: Full code Family Communication: Discussed with the patient.  No family at bedside Disposition Plan: SNF when medically stable  Status is: Inpatient  Remains inpatient appropriate because:IV treatments appropriate due to intensity of illness or inability to take PO   Dispo: The patient is from: Home              Anticipated d/c is to: SNF              Patient currently is not medically stable to d/c.   Difficult to place patient No         Medications:  Scheduled: . (feeding supplement) PROSource Plus  30 mL Oral BID BM  . amiodarone  200 mg Oral BID  . apixaban  5 mg Oral BID  . atorvastatin  80 mg Oral Daily  . digoxin  0.125 mg Oral Daily  . famotidine  20 mg Oral Q2000  . feeding supplement  237 mL Oral BID BM  . insulin aspart  0-15 Units Subcutaneous TID WC  . insulin glargine  15 Units Subcutaneous QHS  . lidocaine  1 patch Transdermal Q24H  . multivitamin with minerals  1 tablet Oral Daily  . pantoprazole  40 mg Oral Daily  . polyethylene glycol  17 g Oral Daily  . senna-docusate  2 tablet Oral BID  .  sodium zirconium cyclosilicate  10 g Oral TID   Continuous: . sodium chloride Stopped (05/16/20 0600)  . ceFEPime (MAXIPIME) IV 2 g (05/24/20 1801)   05/26/20 chloride, bisacodyl, ondansetron (ZOFRAN) IV, oxyCODONE, simethicone   Objective:  Vital Signs  Vitals:   05/25/20 0345 05/25/20 0351 05/25/20 0400 05/25/20 0709  BP: 137/87   (!) 153/79  Pulse: (!) 103 99  95  Resp: (!) 25 19  (!) 21  Temp: 98.6 F (37 C) 98.6 F (37 C)  98.5 F (36.9 C)  TempSrc: Oral Oral  Oral  SpO2: 90% 100% 100% 99%  Weight:      Height:        Intake/Output Summary (Last 24 hours) at 05/25/2020 0919 Last  data filed at 05/25/2020 0659 Gross per 24 hour  Intake 1400 ml  Output 1000 ml  Net 400 ml   Filed Weights   05/21/20 0018 05/22/20 0000 05/23/20 0636  Weight: 84.5 kg 85.3 kg 85.3 kg    General appearance: Awake alert.  In no distress Resp: Less tachypneic today.  Improved air entry bilaterally with few crackles.  No wheezing or rhonchi. Cardio: S1-S2 is normal regular.  No S3-S4.  No rubs murmurs or bruit GI: Abdomen is soft.  Nontender nondistended.  Bowel sounds are present normal.  No masses organomegaly Extremities: Mild edema bilateral lower extremities      Lab Results:  Data Reviewed: I have personally reviewed following labs and imaging studies  CBC: Recent Labs  Lab 05/21/20 0501 05/22/20 0354 05/23/20 0155 05/24/20 0311 05/25/20 0454  WBC 18.1* 20.8* 20.3* 19.4* 21.8*  HGB 9.6* 8.5* 7.9* 8.3* 8.3*  HCT 29.2* 25.5* 24.0* 25.0* 25.6*  MCV 77.0* 77.3* 76.4* 79.4* 79.5*  PLT 258 279 290 258 312    Basic Metabolic Panel: Recent Labs  Lab 05/19/20 0255 05/20/20 0441 05/20/20 2307 05/21/20 0501 05/22/20 0354 05/22/20 1220 05/23/20 0155 05/24/20 0311 05/25/20 0454  NA 124* 123*   < > 124* 126*  --  125* 127* 127*  K 3.9 4.0  --  5.0 5.6* 5.3* 5.0 5.4* 5.2*  CL 70* 75*  --  78* 79*  --  80* 83* 84*  CO2 45* 40*  --  36* 38*  --  36* 33* 33*  GLUCOSE  116* 126*  --  168* 85  --  219* 163* 170*  BUN 17 17  --  19 23  --  26* 30* 32*  CREATININE 1.51* 1.40*  --  1.43* 1.64*  --  1.54* 1.43* 1.44*  CALCIUM 9.1 8.6*  --  9.0 9.5  --  9.2 9.3 9.2  MG 1.5* 2.0  --   --   --   --   --   --   --    < > = values in this interval not displayed.    GFR: Estimated Creatinine Clearance: 38.7 mL/min (A) (by C-G formula based on SCr of 1.44 mg/dL (H)).  Liver Function Tests: Recent Labs  Lab 05/20/20 0441 05/23/20 0155  AST 37 24  ALT 37 25  ALKPHOS 194* 151*  BILITOT 1.3* 1.1  PROT 6.1* 6.6  ALBUMIN 2.1* 2.2*     Coagulation Profile: Recent Labs  Lab 05/19/20 1045  INR 1.3*    CBG: Recent Labs  Lab 05/24/20 0617 05/24/20 1200 05/24/20 1605 05/24/20 2113 05/25/20 0612  GLUCAP 152* 217* 180* 164* 172*      Recent Results (from the past 240 hour(s))  Culture, Urine     Status: Abnormal   Collection Time: 05/22/20  8:26 AM   Specimen: Urine, Random  Result Value Ref Range Status   Specimen Description URINE, RANDOM  Final   Special Requests   Final    NONE Performed at [facility], [address] [place], [place] [address]    Culture (A)  Final    80,000 COLONIES/mL PSEUDOMONAS AERUGINOSA 70,000 COLONIES/mL KLEBSIELLA PNEUMONIAE    Report Status 05/24/2020 FINAL  Final   Organism ID, Bacteria PSEUDOMONAS AERUGINOSA (A)  Final   Organism ID, Bacteria KLEBSIELLA PNEUMONIAE (A)  Final      Susceptibility   Klebsiella pneumoniae - MIC*    AMPICILLIN >=32 RESISTANT Resistant     CEFAZOLIN <=4  SENSITIVE Sensitive     CEFEPIME <=0.12 SENSITIVE Sensitive     CEFTRIAXONE <=0.25 SENSITIVE Sensitive     CIPROFLOXACIN <=0.25 SENSITIVE Sensitive     GENTAMICIN <=1 SENSITIVE Sensitive     IMIPENEM <=0.25 SENSITIVE Sensitive     NITROFURANTOIN 128 RESISTANT Resistant     TRIMETH/SULFA <=20 SENSITIVE Sensitive     AMPICILLIN/SULBACTAM 4 SENSITIVE Sensitive     PIP/TAZO <=4 SENSITIVE Sensitive     * 70,000  COLONIES/mL KLEBSIELLA PNEUMONIAE   Pseudomonas aeruginosa - MIC*    CEFTAZIDIME 4 SENSITIVE Sensitive     CIPROFLOXACIN <=0.25 SENSITIVE Sensitive     GENTAMICIN 2 SENSITIVE Sensitive     IMIPENEM 1 SENSITIVE Sensitive     PIP/TAZO 8 SENSITIVE Sensitive     CEFEPIME 2 SENSITIVE Sensitive     * 80,000 COLONIES/mL PSEUDOMONAS AERUGINOSA      Radiology Studies: No results found.     LOS: 13 days   Corby Villasenor [employer] on [email or web address]  05/25/2020, 9:19 AM

## 2020-05-26 DIAGNOSIS — E119 Type 2 diabetes mellitus without complications: Secondary | ICD-10-CM | POA: Diagnosis not present

## 2020-05-26 DIAGNOSIS — Z743 Need for continuous supervision: Secondary | ICD-10-CM | POA: Diagnosis not present

## 2020-05-26 DIAGNOSIS — I420 Dilated cardiomyopathy: Secondary | ICD-10-CM | POA: Diagnosis not present

## 2020-05-26 DIAGNOSIS — R1031 Right lower quadrant pain: Secondary | ICD-10-CM | POA: Diagnosis not present

## 2020-05-26 DIAGNOSIS — Z794 Long term (current) use of insulin: Secondary | ICD-10-CM | POA: Diagnosis not present

## 2020-05-26 DIAGNOSIS — R2681 Unsteadiness on feet: Secondary | ICD-10-CM | POA: Diagnosis not present

## 2020-05-26 DIAGNOSIS — S272XXA Traumatic hemopneumothorax, initial encounter: Secondary | ICD-10-CM | POA: Diagnosis not present

## 2020-05-26 DIAGNOSIS — Z20822 Contact with and (suspected) exposure to covid-19: Secondary | ICD-10-CM | POA: Diagnosis not present

## 2020-05-26 DIAGNOSIS — R0602 Shortness of breath: Secondary | ICD-10-CM | POA: Diagnosis present

## 2020-05-26 DIAGNOSIS — R6889 Other general symptoms and signs: Secondary | ICD-10-CM | POA: Diagnosis not present

## 2020-05-26 DIAGNOSIS — M7918 Myalgia, other site: Secondary | ICD-10-CM | POA: Diagnosis not present

## 2020-05-26 DIAGNOSIS — I4892 Unspecified atrial flutter: Secondary | ICD-10-CM | POA: Diagnosis not present

## 2020-05-26 DIAGNOSIS — I639 Cerebral infarction, unspecified: Secondary | ICD-10-CM | POA: Diagnosis not present

## 2020-05-26 DIAGNOSIS — R2689 Other abnormalities of gait and mobility: Secondary | ICD-10-CM | POA: Diagnosis not present

## 2020-05-26 DIAGNOSIS — K5900 Constipation, unspecified: Secondary | ICD-10-CM | POA: Diagnosis not present

## 2020-05-26 DIAGNOSIS — Y848 Other medical procedures as the cause of abnormal reaction of the patient, or of later complication, without mention of misadventure at the time of the procedure: Secondary | ICD-10-CM | POA: Diagnosis present

## 2020-05-26 DIAGNOSIS — J9811 Atelectasis: Secondary | ICD-10-CM | POA: Diagnosis not present

## 2020-05-26 DIAGNOSIS — G8194 Hemiplegia, unspecified affecting left nondominant side: Secondary | ICD-10-CM | POA: Diagnosis not present

## 2020-05-26 DIAGNOSIS — R079 Chest pain, unspecified: Secondary | ICD-10-CM | POA: Diagnosis not present

## 2020-05-26 DIAGNOSIS — E871 Hypo-osmolality and hyponatremia: Secondary | ICD-10-CM | POA: Diagnosis not present

## 2020-05-26 DIAGNOSIS — I2510 Atherosclerotic heart disease of native coronary artery without angina pectoris: Secondary | ICD-10-CM | POA: Diagnosis not present

## 2020-05-26 DIAGNOSIS — K5909 Other constipation: Secondary | ICD-10-CM | POA: Diagnosis not present

## 2020-05-26 DIAGNOSIS — R1084 Generalized abdominal pain: Secondary | ICD-10-CM | POA: Diagnosis not present

## 2020-05-26 DIAGNOSIS — G459 Transient cerebral ischemic attack, unspecified: Secondary | ICD-10-CM | POA: Diagnosis not present

## 2020-05-26 DIAGNOSIS — E1159 Type 2 diabetes mellitus with other circulatory complications: Secondary | ICD-10-CM | POA: Diagnosis not present

## 2020-05-26 DIAGNOSIS — I110 Hypertensive heart disease with heart failure: Secondary | ICD-10-CM | POA: Diagnosis not present

## 2020-05-26 DIAGNOSIS — I5023 Acute on chronic systolic (congestive) heart failure: Secondary | ICD-10-CM | POA: Diagnosis not present

## 2020-05-26 DIAGNOSIS — Z833 Family history of diabetes mellitus: Secondary | ICD-10-CM | POA: Diagnosis not present

## 2020-05-26 DIAGNOSIS — I4891 Unspecified atrial fibrillation: Secondary | ICD-10-CM | POA: Diagnosis not present

## 2020-05-26 DIAGNOSIS — I5022 Chronic systolic (congestive) heart failure: Secondary | ICD-10-CM | POA: Diagnosis not present

## 2020-05-26 DIAGNOSIS — I4819 Other persistent atrial fibrillation: Secondary | ICD-10-CM | POA: Diagnosis not present

## 2020-05-26 DIAGNOSIS — Z7901 Long term (current) use of anticoagulants: Secondary | ICD-10-CM | POA: Diagnosis not present

## 2020-05-26 DIAGNOSIS — J90 Pleural effusion, not elsewhere classified: Secondary | ICD-10-CM | POA: Diagnosis not present

## 2020-05-26 DIAGNOSIS — M8580 Other specified disorders of bone density and structure, unspecified site: Secondary | ICD-10-CM | POA: Diagnosis not present

## 2020-05-26 DIAGNOSIS — M5489 Other dorsalgia: Secondary | ICD-10-CM | POA: Diagnosis not present

## 2020-05-26 DIAGNOSIS — R278 Other lack of coordination: Secondary | ICD-10-CM | POA: Diagnosis not present

## 2020-05-26 DIAGNOSIS — R634 Abnormal weight loss: Secondary | ICD-10-CM | POA: Diagnosis not present

## 2020-05-26 DIAGNOSIS — Z8674 Personal history of sudden cardiac arrest: Secondary | ICD-10-CM | POA: Diagnosis not present

## 2020-05-26 DIAGNOSIS — J9601 Acute respiratory failure with hypoxia: Secondary | ICD-10-CM | POA: Diagnosis not present

## 2020-05-26 DIAGNOSIS — R1111 Vomiting without nausea: Secondary | ICD-10-CM | POA: Diagnosis not present

## 2020-05-26 DIAGNOSIS — M6281 Muscle weakness (generalized): Secondary | ICD-10-CM | POA: Diagnosis not present

## 2020-05-26 DIAGNOSIS — I700 Atherosclerosis of aorta: Secondary | ICD-10-CM | POA: Diagnosis not present

## 2020-05-26 DIAGNOSIS — E1165 Type 2 diabetes mellitus with hyperglycemia: Secondary | ICD-10-CM | POA: Diagnosis not present

## 2020-05-26 DIAGNOSIS — F319 Bipolar disorder, unspecified: Secondary | ICD-10-CM | POA: Diagnosis present

## 2020-05-26 DIAGNOSIS — I152 Hypertension secondary to endocrine disorders: Secondary | ICD-10-CM | POA: Diagnosis not present

## 2020-05-26 DIAGNOSIS — R042 Hemoptysis: Secondary | ICD-10-CM | POA: Diagnosis not present

## 2020-05-26 DIAGNOSIS — K219 Gastro-esophageal reflux disease without esophagitis: Secondary | ICD-10-CM | POA: Diagnosis not present

## 2020-05-26 DIAGNOSIS — E782 Mixed hyperlipidemia: Secondary | ICD-10-CM | POA: Diagnosis not present

## 2020-05-26 DIAGNOSIS — I313 Pericardial effusion (noninflammatory): Secondary | ICD-10-CM | POA: Diagnosis not present

## 2020-05-26 DIAGNOSIS — M9689 Other intraoperative and postprocedural complications and disorders of the musculoskeletal system: Secondary | ICD-10-CM | POA: Diagnosis not present

## 2020-05-26 DIAGNOSIS — I428 Other cardiomyopathies: Secondary | ICD-10-CM | POA: Diagnosis not present

## 2020-05-26 DIAGNOSIS — R109 Unspecified abdominal pain: Secondary | ICD-10-CM | POA: Diagnosis not present

## 2020-05-26 DIAGNOSIS — I63111 Cerebral infarction due to embolism of right vertebral artery: Secondary | ICD-10-CM | POA: Diagnosis not present

## 2020-05-26 DIAGNOSIS — Z8616 Personal history of COVID-19: Secondary | ICD-10-CM | POA: Diagnosis not present

## 2020-05-26 DIAGNOSIS — R1312 Dysphagia, oropharyngeal phase: Secondary | ICD-10-CM | POA: Diagnosis not present

## 2020-05-26 DIAGNOSIS — R110 Nausea: Secondary | ICD-10-CM | POA: Diagnosis not present

## 2020-05-26 DIAGNOSIS — R141 Gas pain: Secondary | ICD-10-CM | POA: Diagnosis not present

## 2020-05-26 DIAGNOSIS — R112 Nausea with vomiting, unspecified: Secondary | ICD-10-CM | POA: Diagnosis not present

## 2020-05-26 DIAGNOSIS — E11649 Type 2 diabetes mellitus with hypoglycemia without coma: Secondary | ICD-10-CM | POA: Diagnosis not present

## 2020-05-26 DIAGNOSIS — Z823 Family history of stroke: Secondary | ICD-10-CM | POA: Diagnosis not present

## 2020-05-26 DIAGNOSIS — I462 Cardiac arrest due to underlying cardiac condition: Secondary | ICD-10-CM | POA: Diagnosis not present

## 2020-05-26 DIAGNOSIS — R498 Other voice and resonance disorders: Secondary | ICD-10-CM | POA: Diagnosis not present

## 2020-05-26 DIAGNOSIS — M791 Myalgia, unspecified site: Secondary | ICD-10-CM | POA: Diagnosis not present

## 2020-05-26 DIAGNOSIS — Z9049 Acquired absence of other specified parts of digestive tract: Secondary | ICD-10-CM | POA: Diagnosis not present

## 2020-05-26 DIAGNOSIS — I5020 Unspecified systolic (congestive) heart failure: Secondary | ICD-10-CM | POA: Diagnosis not present

## 2020-05-26 DIAGNOSIS — G8929 Other chronic pain: Secondary | ICD-10-CM | POA: Diagnosis not present

## 2020-05-26 DIAGNOSIS — R9431 Abnormal electrocardiogram [ECG] [EKG]: Secondary | ICD-10-CM | POA: Diagnosis not present

## 2020-05-26 DIAGNOSIS — I10 Essential (primary) hypertension: Secondary | ICD-10-CM | POA: Diagnosis not present

## 2020-05-26 DIAGNOSIS — I480 Paroxysmal atrial fibrillation: Secondary | ICD-10-CM | POA: Diagnosis not present

## 2020-05-26 DIAGNOSIS — R570 Cardiogenic shock: Secondary | ICD-10-CM | POA: Diagnosis not present

## 2020-05-26 DIAGNOSIS — Z8249 Family history of ischemic heart disease and other diseases of the circulatory system: Secondary | ICD-10-CM | POA: Diagnosis not present

## 2020-05-26 DIAGNOSIS — Z8719 Personal history of other diseases of the digestive system: Secondary | ICD-10-CM | POA: Diagnosis not present

## 2020-05-26 DIAGNOSIS — R1011 Right upper quadrant pain: Secondary | ICD-10-CM | POA: Diagnosis not present

## 2020-05-26 LAB — GLUCOSE, CAPILLARY
Glucose-Capillary: 197 mg/dL — ABNORMAL HIGH (ref 70–99)
Glucose-Capillary: 187 mg/dL — ABNORMAL HIGH (ref 70–99)
Glucose-Capillary: 281 mg/dL — ABNORMAL HIGH (ref 70–99)
Glucose-Capillary: 165 mg/dL — ABNORMAL HIGH (ref 70–99)

## 2020-05-26 LAB — DIFFERENTIAL
Abs Immature Granulocytes: 0.22 10*3/uL — ABNORMAL HIGH (ref 0.00–0.07)
Basophils Absolute: 0 10*3/uL (ref 0.0–0.1)
Basophils Relative: 0 %
Eosinophils Absolute: 0 10*3/uL (ref 0.0–0.5)
Eosinophils Relative: 0 %
Immature Granulocytes: 1 %
Lymphocytes Relative: 4 %
Lymphs Abs: 0.7 10*3/uL (ref 0.7–4.0)
Monocytes Absolute: 2.5 10*3/uL — ABNORMAL HIGH (ref 0.1–1.0)
Monocytes Relative: 15 %
Neutro Abs: 13.5 10*3/uL — ABNORMAL HIGH (ref 1.7–7.7)
Neutrophils Relative %: 80 %

## 2020-05-26 LAB — RESP PANEL BY RT-PCR (FLU A&B, COVID) ARPGX2
Influenza A by PCR: NEGATIVE
Influenza B by PCR: NEGATIVE
SARS Coronavirus 2 by RT PCR: NEGATIVE

## 2020-05-26 LAB — BASIC METABOLIC PANEL
Anion gap: 11 (ref 5–15)
BUN: 31 mg/dL — ABNORMAL HIGH (ref 8–23)
CO2: 33 mmol/L — ABNORMAL HIGH (ref 22–32)
Calcium: 8.9 mg/dL (ref 8.9–10.3)
Chloride: 83 mmol/L — ABNORMAL LOW (ref 98–111)
Creatinine, Ser: 1.35 mg/dL — ABNORMAL HIGH (ref 0.44–1.00)
GFR, Estimated: 43 mL/min — ABNORMAL LOW (ref >60)
Glucose, Bld: 198 mg/dL — ABNORMAL HIGH (ref 70–99)
Potassium: 4.5 mmol/L (ref 3.5–5.1)
Sodium: 127 mmol/L — ABNORMAL LOW (ref 135–145)

## 2020-05-26 LAB — CBC
HCT: 26.3 % — ABNORMAL LOW (ref 36.0–46.0)
Hemoglobin: 8.3 g/dL — ABNORMAL LOW (ref 12.0–15.0)
MCH: 25.5 pg — ABNORMAL LOW (ref 26.0–34.0)
MCHC: 31.6 g/dL (ref 30.0–36.0)
MCV: 80.7 fL (ref 80.0–100.0)
Platelets: 313 10*3/uL (ref 150–400)
RBC: 3.26 MIL/uL — ABNORMAL LOW (ref 3.87–5.11)
RDW: 18 % — ABNORMAL HIGH (ref 11.5–15.5)
WBC: 17.1 10*3/uL — ABNORMAL HIGH (ref 4.0–10.5)
nRBC: 0.1 % (ref 0.0–0.2)

## 2020-05-26 LAB — PATHOLOGIST SMEAR REVIEW

## 2020-05-26 MED ORDER — BISACODYL 10 MG RE SUPP: 10.0000 mg | Freq: Every day | RECTAL | 0 refills | Status: DC | PRN

## 2020-05-26 MED ORDER — POLYETHYLENE GLYCOL 3350 17 G PO PACK: 17.0000 g | PACK | Freq: Two times a day (BID) | ORAL | 0 refills | Status: DC

## 2020-05-26 MED ORDER — ISOSORBIDE MONONITRATE ER 30 MG PO TB24: 15.0000 mg | ORAL_TABLET | Freq: Every day | ORAL | Status: DC

## 2020-05-26 MED ORDER — ADULT MULTIVITAMIN W/MINERALS CH: 1.0000 | ORAL_TABLET | Freq: Every day | ORAL | Status: DC

## 2020-05-26 MED ORDER — SENNOSIDES-DOCUSATE SODIUM 8.6-50 MG PO TABS: 2.0000 | ORAL_TABLET | Freq: Two times a day (BID) | ORAL | Status: DC

## 2020-05-26 MED ORDER — OXYCODONE HCL 5 MG PO TABS: 5.0000 mg | ORAL_TABLET | Freq: Four times a day (QID) | ORAL | 0 refills | Status: DC | PRN

## 2020-05-26 MED ORDER — FLEET ENEMA 7-19 GM/118ML RE ENEM
1.0000 | ENEMA | Freq: Once | RECTAL | Status: AC
  Administered 2020-05-26: 1 via RECTAL
  Filled 2020-05-26: qty 1

## 2020-05-26 MED ORDER — HYDRALAZINE HCL 20 MG/ML IJ SOLN
10.0000 mg | Freq: Four times a day (QID) | INTRAMUSCULAR | Status: DC | PRN
Start: 2020-05-26 — End: 2020-05-27
  Administered 2020-05-26: 10 mg via INTRAVENOUS
  Filled 2020-05-26: qty 1

## 2020-05-26 MED ORDER — ENSURE ENLIVE PO LIQD: 237.0000 mL | Freq: Two times a day (BID) | ORAL | 12 refills | Status: DC

## 2020-05-26 MED ORDER — ISOSORBIDE MONONITRATE ER 30 MG PO TB24
15.0000 mg | ORAL_TABLET | Freq: Every day | ORAL | Status: DC
  Administered 2020-05-26: 15 mg via ORAL
  Filled 2020-05-26: qty 1

## 2020-05-26 MED ORDER — DIGOXIN 125 MCG PO TABS: 0.1250 mg | ORAL_TABLET | Freq: Every day | ORAL | Status: DC

## 2020-05-26 MED ORDER — FUROSEMIDE 40 MG PO TABS: ORAL_TABLET | ORAL | 0 refills | Status: DC

## 2020-05-26 MED ORDER — POLYETHYLENE GLYCOL 3350 17 G PO PACK
17.0000 g | PACK | Freq: Two times a day (BID) | ORAL | Status: DC
  Administered 2020-05-26: 17 g via ORAL
  Filled 2020-05-26: qty 1

## 2020-05-26 MED ORDER — HYDRALAZINE HCL 10 MG PO TABS
10.0000 mg | ORAL_TABLET | Freq: Three times a day (TID) | ORAL | Status: DC
  Administered 2020-05-26 (×2): 10 mg via ORAL
  Filled 2020-05-26 (×2): qty 1

## 2020-05-26 MED ORDER — AMIODARONE HCL 200 MG PO TABS: 200.0000 mg | ORAL_TABLET | Freq: Two times a day (BID) | ORAL | Status: DC

## 2020-05-26 MED ORDER — ATORVASTATIN CALCIUM 80 MG PO TABS: 80.0000 mg | ORAL_TABLET | Freq: Every day | ORAL | Status: DC

## 2020-05-26 MED ORDER — PROSOURCE PLUS PO LIQD: 30.0000 mL | Freq: Two times a day (BID) | ORAL | Status: DC

## 2020-05-26 MED ORDER — PANTOPRAZOLE SODIUM 40 MG PO TBEC
40.0000 mg | DELAYED_RELEASE_TABLET | Freq: Every day | ORAL | Status: DC
Start: 2020-05-27 — End: 2020-07-21

## 2020-05-26 MED ORDER — HYDRALAZINE HCL 10 MG PO TABS
10.0000 mg | ORAL_TABLET | Freq: Three times a day (TID) | ORAL | Status: DC
Start: 2020-05-26 — End: 2020-06-21

## 2020-05-26 NOTE — Progress Notes (Signed)
   05/26/20 1931  Provider Notification  Provider Name/Title Dr Arville Care  Date Provider Notified 05/26/20  Time Provider Notified 1930  Notification Type Page  Notification Reason Other (Comment) (BP 187/87)  Provider response See new orders  Date of Provider Response 05/26/20  Time of Provider Response 1936

## 2020-05-26 NOTE — Progress Notes (Signed)
Inpatient Diabetes Program Recommendations  AACE/ADA: New Consensus Statement on Inpatient Glycemic Control (2015)  Target Ranges:  Prepandial:   less than 140 mg/dL      Peak postprandial:   less than 180 mg/dL (1-2 hours)      Critically ill patients:  140 - 180 mg/dL   Results for [name], [name] (MRN [id number]) as of 05/26/2020 11:13  Ref. Range 05/25/2020 06:12 05/25/2020 12:56 05/25/2020 16:14 05/25/2020 21:03  Glucose-Capillary Latest Ref Range: 70 - 99 mg/dL 432 (H)  2 units NOVOLOG  203 (H)  5 units NOVOLOG  174 (H)  3 units NOVOLOG  242 (H)    15 units LANTUS   Results for [name], [name] (MRN [id number]) as of 05/26/2020 11:13  Ref. Range 05/26/2020 06:13  Glucose-Capillary Latest Ref Range: 70 - 99 mg/dL 574 (H)  3 units NOVOLOG    Home DM Meds: Lantus 20 units Daily       Ozempic 0.25 mg Qweek  Current Orders: Lantus 15 units QHS       Novolog 0-15 units TID    MD- Please consider the following:  1. Increase Lantus to 17 units QHS  2. Start Novolog Meal Coverage: Novolog 3 units TID with meals Hold if pt eats <50% of meal, Hold if pt NPO    --Will follow patient during hospitalization--  Ambrose Finland RN, MSN, CDE Diabetes Coordinator Inpatient Glycemic Control Team Team Pager: [phone] (8a-5p)

## 2020-05-26 NOTE — Progress Notes (Signed)
Pt has SNF. D/w Dr. Gala Romney, ok for d/c from cardiac standpoint. [facility] f/u has been arranged and appt info is in AVS.   Cardiac Meds for Discharge  Amiodarone 200 mg bid Eliquis 5 mg bid Atorvastatin 80 mg daily  Digoxin 0.125 mg daily  Hydralazine 10 mg tid Imdur 15 mg daily  Lasix 40 mg PRN for Wt >190 lb

## 2020-05-26 NOTE — Progress Notes (Addendum)
Patient ID: [name] [name], female   DOB: 1950/12/15, 70 y.o.   MRN: [id number]     Advanced Heart Failure Rounding Note  PCP-Cardiologist: Kardie Tobb, DO   Subjective:    5/5 Milrinone increased to 0.25 mcg. Off norepi. 5/8 Lasix drip stopped. Continue on milrinone 0.125 mcg.  5/9 S/P TEE/DC-CV -->NSR.  5/10 switched to po amio . Given tolvaptan. Orthostatic with PT.   Patient was given a dose of furosemide 60 mg x1 over the weekend for dyspnea.  Feels better. Wt not checked in 3 days.   BP active meds have been on hold for orthostasis.   SBPs 150s. She now  reports less dizziness w/ standing. Orthostatic VS pending.   Off sprio w/ hyperkalemia. Required additional lokelma over the weekend. K 5.4>>5.2>>4.5  Remains in NSR on tele.   Hgb 8.3. Denies melena/ hematochezia.   WBC remains elevated but downtrending, 22>>17K AF. On abx for UTI.   Objective:   Weight Range: 85.3 kg Body mass index is 33.31 kg/m.   Vital Signs:   Temp:  [98 F (36.7 C)-98.5 F (36.9 C)] 98 F (36.7 C) (05/16 0801) Pulse Rate:  [91-97] 91 (05/16 0801) Resp:  [17-21] 17 (05/16 0801) BP: (132-159)/(77-87) 159/87 (05/16 0801) SpO2:  [100 %] 100 % (05/16 0801) Last BM Date: 05/21/20  Weight change: Filed Weights   05/21/20 0018 05/22/20 0000 05/23/20 0636  Weight: 84.5 kg 85.3 kg 85.3 kg    Intake/Output:   Intake/Output Summary (Last 24 hours) at 05/26/2020 1024 Last data filed at 05/26/2020 0725 Gross per 24 hour  Intake 420 ml  Output 75 ml  Net 345 ml      Physical Exam   PHYSICAL EXAM: General:  fatigue appearing. No respiratory difficulty HEENT: normal Neck: supple. no JVD. Carotids 2+ bilat; no bruits. No lymphadenopathy or thyromegaly appreciated. Cor: PMI nondisplaced. Regular rate & rhythm. No rubs, gallops or murmurs. Lungs: clear Abdomen: soft, nontender, nondistended. No hepatosplenomegaly. No bruits or masses. Good bowel sounds. Extremities: no cyanosis,  clubbing, rash, edema Neuro: alert & oriented x 3, cranial nerves grossly intact. moves all 4 extremities w/o difficulty. Affect pleasant.    Telemetry    NSR 90s Personally reviewed   Labs    CBC Recent Labs    05/25/20 0454 05/26/20 0433  WBC 21.8* 17.1*  NEUTROABS  --  13.5*  HGB 8.3* 8.3*  HCT 25.6* 26.3*  MCV 79.5* 80.7  PLT 312 313   Basic Metabolic Panel Recent Labs    60/73/71 0454 05/26/20 0433  NA 127* 127*  K 5.2* 4.5  CL 84* 83*  CO2 33* 33*  GLUCOSE 170* 198*  BUN 32* 31*  CREATININE 1.44* 1.35*  CALCIUM 9.2 8.9   Liver Function Tests No results for input(s): AST, ALT, ALKPHOS, BILITOT, PROT, ALBUMIN in the last 72 hours. No results for input(s): LIPASE, AMYLASE in the last 72 hours. Cardiac Enzymes No results for input(s): CKTOTAL, CKMB, CKMBINDEX, TROPONINI in the last 72 hours.  BNP: BNP (last 3 results) Recent Labs    03/03/20 1559  BNP 1,981.9*    ProBNP (last 3 results) No results for input(s): PROBNP in the last 8760 hours.   D-Dimer No results for input(s): DDIMER in the last 72 hours. Hemoglobin A1C No results for input(s): HGBA1C in the last 72 hours. Fasting Lipid Panel No results for input(s): CHOL, HDL, LDLCALC, TRIG, CHOLHDL, LDLDIRECT in the last 72 hours. Thyroid Function Tests No results for input(s): TSH,  T4TOTAL, T3FREE, THYROIDAB in the last 72 hours.  Invalid input(s): FREET3  Other results:   Imaging    No results found.   Medications:     Scheduled Medications: . (feeding supplement) PROSource Plus  30 mL Oral BID BM  . amiodarone  200 mg Oral BID  . apixaban  5 mg Oral BID  . atorvastatin  80 mg Oral Daily  . digoxin  0.125 mg Oral Daily  . famotidine  20 mg Oral Q2000  . feeding supplement  237 mL Oral BID BM  . insulin aspart  0-15 Units Subcutaneous TID WC  . insulin glargine  15 Units Subcutaneous QHS  . lidocaine  1 patch Transdermal Q24H  . multivitamin with minerals  1 tablet Oral  Daily  . pantoprazole  40 mg Oral Daily  . polyethylene glycol  17 g Oral BID  . senna-docusate  2 tablet Oral BID  . sodium phosphate  1 enema Rectal Once    Infusions: . sodium chloride Stopped (05/16/20 0600)  . ceFEPime (MAXIPIME) IV 2 g (05/25/20 2221)    PRN Medications: sodium chloride, bisacodyl, ondansetron (ZOFRAN) IV, oxyCODONE, simethicone    Assessment/Plan   1. Acute on chronic systolic HF ->  Cardiogenic shock - EF < 15% with severe biventricular failure - almost certainly due to tachy-induced (AF) cardiomyopathy - Volume status ok on exam. Holding diuretics. Orthostatic 5/10 with PT.  - off metoprolol -- patient says she is allergic though she was on this prior to admit.  - Hold off on entresto or farxiga with orthostasis.  - continue digoxin  - Off spiro w/ hyperkalemia - doubt she can tolerate losartan w/ persistent hyperkalemia (ok today)  - RN to check orthostatic VS again today. If negative, can try low dose hydralazine 10 tid + Imdur - Have also asked RN to check and document weight for today  2. AF with RVR/atrial tach - amiodarone for rate/rhythm control -  AT =>  atrial tachy with 2:1 block.   - 5/9 S/P TEE DC-CV -->NSR.  - Maintaining NSR. Continue amio 200 mg twice a day.  - Continue Eliquis 5 bid  3. Acute embolic CVA  - s/p successful revascularization with systemic T-PA and mechanical thrombectomy - no clinical residual and no significant defect on MRI - Continue ELiquis - Continue PT/OT--recommending SNF  4. Non-compliance - encouraged her to work with PT  5. Bipolar d/o - will need outpatient f/u w/ mental health   6. Poorly controlled DM2 - hgb A1c 10.5  - insulin per primary team   7. PEA arrest - Had brief CPR and intubated. - On 3 liters [place].   8. Moderate Pericardial Effusion w/o Tamponade  - Eventual repeat echo.   9. Leukocytosis - WBC 22>>17K. AF. Suspect due to UTI  - on ceftriaxone  10. Anemia - Hgb continues  to trend down, 8.3 today  - got feraheme 5/6 and 2nd dose 5/13  - check FOBT    11. Hyponatremia - Na 127 today  - ? tolvaptan  - monitor   12. Hyperkalemia - off spiro  - 4.5 today - PRN lokelma   Will need SNF. SW following for placement.   Length of Stay: [address] PA-C  05/26/2020, 10:24 AM  Advanced Heart Failure Team Pager [phone] (M-F; 7a - 5p)  Please contact [facility] for night-coverage after hours (5p -7a ) and weekends on [email or web address]  Patient seen and examined with the above-signed Advanced Practice  Provider and/or Housestaff. I personally reviewed laboratory data, imaging studies and relevant notes. I independently examined the patient and formulated the important aspects of the plan. I have edited the note to reflect any of my changes or salient points. I have personally discussed the plan with the patient and/or family.  Remains in NSR. Feels ok. Weak.   GDMT has been limited by orthostasis and hyperkalemia. Not participating with rehab much.  General:  Weak appearing. No resp difficulty HEENT: normal Neck: supple. no JVD. Carotids 2+ bilat; no bruits. No lymphadenopathy or thryomegaly appreciated. Cor: PMI nondisplaced. Regular rate & rhythm. No rubs, gallops or murmurs. Lungs: clear Abdomen: soft, nontender, nondistended. No hepatosplenomegaly. No bruits or masses. Good bowel sounds. Extremities: no cyanosis, clubbing, rash, edema Neuro: alert & orientedx3, cranial nerves grossly intact. moves all 4 extremities w/o difficulty. Affect pleasant   Remains in NSR. GDMT limited by orthostasis and hyperkalemia. Will try low-dose hydral and Imdur. No SGLT2i yet with current UTI.  Arvilla Meres, MD  12:10 PM

## 2020-05-26 NOTE — Progress Notes (Signed)
Pt earlier discharged waiting for EMS for transport, transport came and picked up pt at 2053 to nursing home alongside her belongings, pt reassured. [name] [name], Nathin Saran Efe

## 2020-05-26 NOTE — Progress Notes (Signed)
Report called to [facility]; awaiting transport via [facility].

## 2020-05-26 NOTE — Consult Note (Signed)
   [facility] Inpatient Consult   05/26/2020  [name] February 15, 1950 [id number]   [facility] Follow Up:  Hospital liaison coverage for [employer]. Per chart review, current disposition plan is for skilled nursing facility. No [facility] CM post hospital follow up needs.  Of note, [facility] Care Management services does not replace or interfere with any services that are arranged by inpatient case management or social work.  Christophe Louis, MSN, RN [facility] Liaison Nurse Mobile Phone [phone]  Toll free office [phone]

## 2020-05-26 NOTE — Discharge Summary (Signed)
[facility]  Physician Discharge Summary   Patient ID: [name] [name] MRN: [id number] DOB/AGE: 1950/02/21 70 y.o.  Admit date: 05/12/2020 Discharge date: 05/26/2020  PCP: Carollee Leitz, MD  DISCHARGE DIAGNOSES:  Acute on chronic systolic CHF Acute embolic CVA Cardiogenic shock PEA arrest Hyponatremia Paroxysmal atrial fibrillation Diabetes mellitus type 2, uncontrolled with hyperglycemia Leukocytosis Anemia of chronic disease Bipolar disorder Constipation secondary to medications  RECOMMENDATIONS FOR OUTPATIENT FOLLOW UP: 1. Please check CBC and basic metabolic panel, magnesium level on a weekly basis starting this Thursday 2. Cardiology to arrange outpatient follow-up 3. Monitor CBGs and adjust insulin dose as needed    Home Health: Going to skilled nursing facility for short-term rehab Equipment/Devices: None  CODE STATUS: Full code  DISCHARGE CONDITION: fair  Diet recommendation: Modified carbohydrate  INITIAL HISTORY: 70 year old lady with prior history of atrial fibrillation, chronic systolic heart failure with left ventricular ejection fraction of 25 to 30%, hypertension, hyperlipidemia, history of COVID-19 infection in the past presented with left-sided weakness, required tPA and mechanical thrombectomy,had a cardiac arrest/PEA arrest immediately following her thrombectomy, underwent CPR for less than 5 minutes, intubated, started on pressors transferred to [facility] service.Patient was extubated on 05/13/2020 and was weaned off vasopressors. Patient also underwent TEE/DCCV on 05/19/20. She was transferred to [facility] on 05/20/2020.  Consultants:  Critical care medicine Heart failure team Palliative care  Procedures:  TEE/DC cardioversion on 5/9   HOSPITAL COURSE:   Acute embolic CVA She is status post revascularization with tPA and mechanical thrombectomy.  Seen by neurology.  Seen by PT and OT.    Skilled nursing facility was recommended for short-term  rehab. LDL 61.  HbA1c 10.0.  Noted to be on statin.   Patient was initially on IV heparin and has been transitioned to apixaban.  Acute on chronic systolic CHF/cardiogenic shock/PEA arrest Patient went into cardiac arrest after she underwent tPA and mechanical thrombectomy for acute stroke.  Patient seen by heart failure team.  Patient was on milrinone infusion and on diuretics.  Subsequently taken off of milrinone.  Remains on digoxin.  Spironolactone was discontinued due to hyperkalemia. Patient was given a dose of furosemide over the weekend for dyspnea.  Feels better.  Started on hydralazine and nitrates today by heart failure team.  The recommend Lasix as needed for weight greater than 190 pounds.  They will arrange outpatient follow-up and further titrate medications at that time.  Hyperkalemia Potassium level has improved after multiple doses of Lokelma.  Spironolactone was discontinued.  Hyponatremia Given tolvaptan 5/10.  Sodium level has improved and remains at 127.    Atrial fibrillation with RVR Stable.  Currently in sinus rhythm.  Remains on amiodarone and anticoagulated with Eliquis.    Diabetes mellitus type 2, uncontrolled with hyperglycemia HbA1c 10.5.    Continue with the Lantus insulin.  Monitor CBGs at skilled nursing facility and adjust the dose of insulin as needed   Leukocytosis/possible UTI Patient with persistent rise in WBC count without any clear-cut reason.  UA was abnormal.  Patient was unable to tell if she had any urinary symptoms.    Patient was initially given ceftriaxone.  Cultures growing Pseudomonas and Klebsiella although less than 100,000 colonies.    Significance of this was unclear.  Patient however was changed over to cefepime and was given cefepime till discharge.  WBC noted to be better today at 17.1.  Part of the reason for this could be reactive.  Will recommend that WBC be checked later this week  and then weekly.  If there is persistent  leukocytosis then would recommend hematology input at some point in time.  Anemia of chronic disease Gradual drop in hemoglobin was noted.  No overt bleeding identified.  She has had brown stool.  Patient was given 2 doses of Feraheme during this hospitalization.  Hemoglobin stable.   Anemia panel checked on 5/6 showed ferritin to be 124, iron 20, TIBC 305, folate level 11.2.  B12 level was 2001. Will need further work-up in the outpatient setting once she is over her acute illness.  Bipolar disorder Outpatient follow-up.  Constipation Last bowel movement was on the 13th.  TSH was 1.4 earlier this month.  This is likely due to the fact that she has been taking oxycodone.  Laxative dose increase.  She will be given an enema today prior to discharge.  Musculoskeletal chest and scapular pain This is secondary to CPR that was performed.  She has been using oxycodone.  Gradually wean this off.   Obesity Estimated body mass index is 33.31 kg/m as calculated from the following:   Height as of this encounter: $RemoveBeforeD'5\' 3"'[id number]$  (1.6 m).   Weight as of this encounter: 85.3 kg.   Patient is stable.  Cleared by heart failure team for discharge today.  Okay for discharge to SNF.  Discussed with daughter earlier today.   PERTINENT LABS:  The results of significant diagnostics from this hospitalization (including imaging, microbiology, ancillary and laboratory) are listed below for reference.    Microbiology: Recent Results (from the past 240 hour(s))  Culture, Urine     Status: Abnormal   Collection Time: 05/22/20  8:26 AM   Specimen: Urine, Random  Result Value Ref Range Status   Specimen Description URINE, RANDOM  Final   Special Requests   Final    NONE Performed at [facility], [address], [place], [place] [address]    Culture (A)  Final    80,000 COLONIES/mL PSEUDOMONAS AERUGINOSA 70,000 COLONIES/mL KLEBSIELLA PNEUMONIAE    Report Status 05/24/2020 FINAL  Final   Organism  ID, Bacteria PSEUDOMONAS AERUGINOSA (A)  Final   Organism ID, Bacteria KLEBSIELLA PNEUMONIAE (A)  Final      Susceptibility   Klebsiella pneumoniae - MIC*    AMPICILLIN >=32 RESISTANT Resistant     CEFAZOLIN <=4 SENSITIVE Sensitive     CEFEPIME <=0.12 SENSITIVE Sensitive     CEFTRIAXONE <=0.25 SENSITIVE Sensitive     CIPROFLOXACIN <=0.25 SENSITIVE Sensitive     GENTAMICIN <=1 SENSITIVE Sensitive     IMIPENEM <=0.25 SENSITIVE Sensitive     NITROFURANTOIN 128 RESISTANT Resistant     TRIMETH/SULFA <=20 SENSITIVE Sensitive     AMPICILLIN/SULBACTAM 4 SENSITIVE Sensitive     PIP/TAZO <=4 SENSITIVE Sensitive     * 70,000 COLONIES/mL KLEBSIELLA PNEUMONIAE   Pseudomonas aeruginosa - MIC*    CEFTAZIDIME 4 SENSITIVE Sensitive     CIPROFLOXACIN <=0.25 SENSITIVE Sensitive     GENTAMICIN 2 SENSITIVE Sensitive     IMIPENEM 1 SENSITIVE Sensitive     PIP/TAZO 8 SENSITIVE Sensitive     CEFEPIME 2 SENSITIVE Sensitive     * 80,000 COLONIES/mL PSEUDOMONAS AERUGINOSA     Labs:  COVID-19 Labs   Lab Results  Component Value Date   SARSCOV2NAA NEGATIVE 05/12/2020   SARSCOV2NAA POSITIVE (A) 02/03/2020   [place] NEGATIVE 07/26/2019      Basic Metabolic Panel: Recent Labs  Lab 05/20/20 0441 05/20/20 2307 05/22/20 0354 05/22/20 1220 05/23/20 0155 05/24/20 6378  05/25/20 0454 05/26/20 0433  NA 123*   < > 126*  --  125* 127* 127* 127*  K 4.0   < > 5.6* 5.3* 5.0 5.4* 5.2* 4.5  CL 75*   < > 79*  --  80* 83* 84* 83*  CO2 40*   < > 38*  --  36* 33* 33* 33*  GLUCOSE 126*   < > 85  --  219* 163* 170* 198*  BUN 17   < > 23  --  26* 30* 32* 31*  CREATININE 1.40*   < > 1.64*  --  1.54* 1.43* 1.44* 1.35*  CALCIUM 8.6*   < > 9.5  --  9.2 9.3 9.2 8.9  MG 2.0  --   --   --   --   --   --   --    < > = values in this interval not displayed.   Liver Function Tests: Recent Labs  Lab 05/20/20 0441 05/23/20 0155  AST 37 24  ALT 37 25  ALKPHOS 194* 151*  BILITOT 1.3* 1.1  PROT 6.1* 6.6   ALBUMIN 2.1* 2.2*   CBC: Recent Labs  Lab 05/22/20 0354 05/23/20 0155 05/24/20 0311 05/25/20 0454 05/26/20 0433  WBC 20.8* 20.3* 19.4* 21.8* 17.1*  NEUTROABS  --   --   --   --  13.5*  HGB 8.5* 7.9* 8.3* 8.3* 8.3*  HCT 25.5* 24.0* 25.0* 25.6* 26.3*  MCV 77.3* 76.4* 79.4* 79.5* 80.7  PLT 279 290 258 312 313   BNP: BNP (last 3 results) Recent Labs    03/03/20 1559  BNP 1,981.9*    CBG: Recent Labs  Lab 05/25/20 1256 05/25/20 1614 05/25/20 2103 05/26/20 0613 05/26/20 1156  GLUCAP 203* 174* 242* 197* 187*     IMAGING STUDIES DG Abd 1 View  Result Date: 05/12/2020 CLINICAL DATA:  Enteric catheter placement EXAM: ABDOMEN - 1 VIEW COMPARISON:  None. FINDINGS: Frontal view of the lower chest and upper abdomen demonstrates enteric catheter passing below diaphragm tip overlying proximal duodenum. External defibrillator pads overlie the lower chest. Bowel gas pattern is unremarkable. IMPRESSION: 1. Enteric catheter tip projecting over proximal duodenum. Electronically Signed   By: Randa Ngo M.D.   On: 05/12/2020 16:28   CT ANGIO NECK W OR WO CONTRAST  Result Date: 05/12/2020 CLINICAL DATA:  Left-sided weakness EXAM: CT ANGIOGRAPHY HEAD AND NECK TECHNIQUE: Multidetector CT imaging of the head and neck was performed using the standard protocol during bolus administration of intravenous contrast. Multiplanar CT image reconstructions and MIPs were obtained to evaluate the vascular anatomy. Carotid stenosis measurements (when applicable) are obtained utilizing NASCET criteria, using the distal internal carotid diameter as the denominator. CONTRAST:  31mL OMNIPAQUE IOHEXOL 350 MG/ML SOLN COMPARISON:  Noncontrast head CT performed earlier today 05/12/2020. FINDINGS: CTA NECK FINDINGS Aortic arch: Standard aortic branching. No hemodynamically significant innominate or proximal subclavian artery stenosis. Right carotid system: CCA and ICA patent within the neck without significant  stenosis (50% or greater). Mild soft and calcified plaque within the carotid bifurcation and proximal ICA. Asymmetrically diminished enhancement of the cervical right ICA, likely due to downstream occlusion. Left carotid system: CCA and ICA patent within the neck without significant stenosis (50% or greater). Mild soft and calcified plaque within the carotid bifurcation and proximal ICA. Vertebral arteries: Patent within the neck without stenosis. Duplicated proximal left vertebral artery. Skeleton: No acute bony abnormality or aggressive osseous lesion. Cervical spondylosis. Other neck: Asymmetric prominence of the right  thyroid lobe. Underlying right thyroid lobe nodule measuring at least 2.8 cm. Upper chest: Partially imaged left pleural effusion. Review of the MIP images confirms the above findings CTA HEAD FINDINGS Anterior circulation: Asymmetrically diminished enhancement and caliber of the right ICA siphon, likely secondary to downstream occlusion. Absent enhancement within the right ICA terminus, proximal M1 right MCA and proximal A1 right ACA. Findings are compatible with T-shaped occlusion secondary to thrombus. There is some reconstitution of enhancement more distally within the M1 right MCA and within right M2 vessels. There is reconstitution of enhancement within the distal A1 right ACA. The intracranial left ICA is patent. No left M2 proximal branch occlusion or high-grade proximal stenosis is identified. Developmentally hypoplastic A1 left ACA. The A2 and more distal anterior cerebral arteries are patent. No intracranial aneurysm is identified. Posterior circulation: The intracranial vertebral arteries are patent. The basilar artery is patent. The posterior cerebral arteries are patent. A right posterior communicating artery is present. The left posterior communicating artery is hypoplastic or absent. Venous sinuses: Within the limitations of contrast timing, no convincing thrombus. Anatomic  variants: As described Review of the MIP images confirms the above findings Critical/emergent results were called by telephone at the time of interpretation on 05/12/2020 at 12:53 pm to provider ERIC [facility] , who verbally acknowledged these results. IMPRESSION: CTA neck: 1. The common carotid, internal carotid and vertebral arteries are patent within the neck without hemodynamically significant stenosis. Mild atherosclerotic disease within the carotid bifurcations and proximal ICAs. 2. Asymmetrically diminished enhancement of the cervical right ICA, likely secondary to downstream occlusion. 3. 2.8 cm right thyroid lobe nodule. Non-emergent thyroid ultrasound is recommended for further evaluation. CTA head: T-shaped large vessel occlusion involving the right ICA terminus, proximal M1 right middle cerebral artery and proximal A1 right anterior cerebral artery. Electronically Signed   By: Kellie Simmering DO   On: 05/12/2020 13:13   MR ANGIO HEAD WO CONTRAST  Result Date: 05/13/2020 CLINICAL DATA:  Follow-up stroke with intervention EXAM: MRI HEAD WITHOUT CONTRAST MRA HEAD WITHOUT CONTRAST TECHNIQUE: Multiplanar, multiecho pulse sequences of the brain and surrounding structures were obtained without intravenous contrast. Angiographic images of the head were obtained using MRA technique without contrast. COMPARISON:  CT and interventional studies of yesterday. FINDINGS: MRI HEAD FINDINGS Brain: Diffusion imaging shows a few scattered subcentimeter and punctate infarctions in the right middle cerebral artery territory affecting the posterior frontal, parietal and parietooccipital brain. No large confluent infarction. No other vascular territory insult. Elsewhere, there are mild chronic small-vessel ischemic changes of the cerebral hemispheric white matter. No sign of swelling or hemorrhage. No hydrocephalus or extra-axial collection. Vascular: Major vessels at the base of the brain show flow. Skull and upper cervical  spine: Negative Sinuses/Orbits: Clear/normal Other: None MRA HEAD FINDINGS Both internal carotid arteries are widely patent through the skull base and siphon regions. Left ICA supplies only the middle cerebral artery territory. Right ICA supplies the middle cerebral artery territory and both posterior cerebral artery territories. Major vessels all show flow. Both vertebral arteries widely patent to the basilar. No basilar stenosis. Posterior circulation branch vessels appear normal. IMPRESSION: Successful vascular recanalization. No large or medium vessel occlusion presently. Several scattered subcentimeter and punctate acute infarctions in the periphery of the right middle cerebral artery territory consistent with minimal embolic insults. No swelling or hemorrhage. Electronically Signed   By: Nelson Chimes M.D.   On: 05/13/2020 14:02   MR BRAIN WO CONTRAST  Result Date: 05/13/2020 CLINICAL DATA:  Follow-up  stroke with intervention EXAM: MRI HEAD WITHOUT CONTRAST MRA HEAD WITHOUT CONTRAST TECHNIQUE: Multiplanar, multiecho pulse sequences of the brain and surrounding structures were obtained without intravenous contrast. Angiographic images of the head were obtained using MRA technique without contrast. COMPARISON:  CT and interventional studies of yesterday. FINDINGS: MRI HEAD FINDINGS Brain: Diffusion imaging shows a few scattered subcentimeter and punctate infarctions in the right middle cerebral artery territory affecting the posterior frontal, parietal and parietooccipital brain. No large confluent infarction. No other vascular territory insult. Elsewhere, there are mild chronic small-vessel ischemic changes of the cerebral hemispheric white matter. No sign of swelling or hemorrhage. No hydrocephalus or extra-axial collection. Vascular: Major vessels at the base of the brain show flow. Skull and upper cervical spine: Negative Sinuses/Orbits: Clear/normal Other: None MRA HEAD FINDINGS Both internal carotid  arteries are widely patent through the skull base and siphon regions. Left ICA supplies only the middle cerebral artery territory. Right ICA supplies the middle cerebral artery territory and both posterior cerebral artery territories. Major vessels all show flow. Both vertebral arteries widely patent to the basilar. No basilar stenosis. Posterior circulation branch vessels appear normal. IMPRESSION: Successful vascular recanalization. No large or medium vessel occlusion presently. Several scattered subcentimeter and punctate acute infarctions in the periphery of the right middle cerebral artery territory consistent with minimal embolic insults. No swelling or hemorrhage. Electronically Signed   By: Nelson Chimes M.D.   On: 05/13/2020 14:02   IR CT Head Ltd  Result Date: 05/13/2020 INDICATION: 70 year old female with past medical history significant for atrial fibrillation and CHF. She was last known well at 10 a.m. on 05/12/2020. At 11:30 a.m., she was noted to have facial droop and slurred speech. She was brought by EMS to ED. At presentation, she head left-sided hemiparesis with NIHSS 6. Head CT showed no evidence of a large territorial infarct or hemorrhage. CT angiogram showed an occlusion of the right ICA terminus extending into the proximal M1 and A1. IV tPA bolus was administered at 13:15. She was then taken to our service for a mechanical thrombectomy. EXAM: ULTRASOUND-GUIDED VASCULAR ACCESS DIAGNOSTIC CEREBRAL ANGIOGRAM MECHANICAL THROMBECTOMY FLAT PANEL HEAD CT COMPARISON:  CT/CT angiogram of the head and neck May 12, 2020 MEDICATIONS: Refer to anesthesia documentation ANESTHESIA/SEDATION: The procedure was performed in the general anesthesia. CONTRAST:  50 mL of Omnipaque 300. FLUOROSCOPY TIME:  Fluoroscopy Time: 8 minutes (233 mGy). COMPLICATIONS: None immediate. TECHNIQUE: Informed written consent was obtained from the patient's daughter after a thorough discussion of the procedural risks, benefits and  alternatives. All questions were addressed. Maximal Sterile Barrier Technique was utilized including caps, mask, sterile gowns, sterile gloves, sterile drape, hand hygiene and skin antiseptic. A timeout was performed prior to the initiation of the procedure. The right groin was prepped and draped in the usual sterile fashion. Using a micropuncture kit and the modified Seldinger technique, access was gained to the right common femoral artery and an 8 French sheath was placed. Real-time ultrasound guidance was utilized for vascular access including the acquisition of a permanent ultrasound image documenting patency of the accessed vessel. Under fluoroscopy, a Zoom 88 guide catheter was navigated over a 6 [place] Berenstein 2 catheter and a 0.035" Terumo Glidewire into the aortic arch. The catheter was placed into the right common carotid artery. Frontal and lateral angiograms of the head and neck were obtained. FINDINGS: Patent right common femoral artery. There is no occlusion of the intracranial right internal carotid just distal to the origin of the right  posterior communicating artery. Minimal distal contrast penetration is seen. PROCEDURE: Under biplane roadmap, the catheter construct was advanced over the Glidewire into the cervical right internal carotid artery. The Berenstein catheter was removed. Under fluoroscopy, a zoom 71 aspiration catheter was navigated through the zoom 88 guide catheter and over an Aristotle 18 micro guidewire into the supraclinoid right ICA. The zoom 71 catheter was connected to a penumbra aspiration pump in continuous aspiration was performed for approximately 1 minute. Continuous aspiration was also performed concomitantly in the zoom 88 catheter with a 60 cc vacuum pressure syringe. The aspiration catheter was removed under constant aspiration. The guiding catheter was aspirated until clear blood was obtained. Right internal carotid artery angiogram showed complete recanalization of  the right ICA (TICI3). Delay right ICA angiograms showed persistent patency of the right ICA, right MCA ACA vascular trees. Flat panel CT of the head was obtained and post processed in a separate workstation with concurrent attending physician supervision. Selected images were sent to PACS. No evidence of hemorrhagic complication. A right common femoral artery angiogram was obtained with frontal view. The access is at the level of the right common femoral artery which has adequate caliber for closure device. The femoral sheath was exchanged over the wire for a Perclose prostyle which was utilized for access closure. Immediate hemostasis was achieved. Patient became hypotensive while awaiting COVID test result for extubation, after intervention was concluded and eventually progressed to PEA arrest. ROSC obtained after 2 rounds of chest compressions. Patient stabilized and transferred to ICU. Please see anesthesia documentation for further details. Family updated by telephone. IMPRESSION: 1. Successful mechanical thrombectomy for treatment of a right ICA terminus occlusion with direct contact aspiration. Total of 1 pass with complete recanalization (TICI 3). 2. No thromboembolic or hemorrhagic complication. 3. Postprocedural PEA cardiac arrest with ROSC obtained after 2 rounds of chest compression. Please see anesthesia documentation for further details. PLAN: 1. Transferd to ICU for further care. 2. Follow-up head CT/MRI within 24 hours. Electronically Signed   By: Pedro Earls M.D.   On: 05/13/2020 10:33   IR US Guide Vasc Access Right  Result Date: 05/13/2020 INDICATION: 70 year old female with past medical history significant for atrial fibrillation and CHF. She was last known well at 10 a.m. on 05/12/2020. At 11:30 a.m., she was noted to have facial droop and slurred speech. She was brought by EMS to ED. At presentation, she head left-sided hemiparesis with NIHSS 6. Head CT showed no evidence  of a large territorial infarct or hemorrhage. CT angiogram showed an occlusion of the right ICA terminus extending into the proximal M1 and A1. IV tPA bolus was administered at 13:15. She was then taken to our service for a mechanical thrombectomy. EXAM: ULTRASOUND-GUIDED VASCULAR ACCESS DIAGNOSTIC CEREBRAL ANGIOGRAM MECHANICAL THROMBECTOMY FLAT PANEL HEAD CT COMPARISON:  CT/CT angiogram of the head and neck May 12, 2020 MEDICATIONS: Refer to anesthesia documentation ANESTHESIA/SEDATION: The procedure was performed in the general anesthesia. CONTRAST:  50 mL of Omnipaque 300. FLUOROSCOPY TIME:  Fluoroscopy Time: 8 minutes (233 mGy). COMPLICATIONS: None immediate. TECHNIQUE: Informed written consent was obtained from the patient's daughter after a thorough discussion of the procedural risks, benefits and alternatives. All questions were addressed. Maximal Sterile Barrier Technique was utilized including caps, mask, sterile gowns, sterile gloves, sterile drape, hand hygiene and skin antiseptic. A timeout was performed prior to the initiation of the procedure. The right groin was prepped and draped in the usual sterile fashion. Using a micropuncture kit  and the modified Seldinger technique, access was gained to the right common femoral artery and an 8 French sheath was placed. Real-time ultrasound guidance was utilized for vascular access including the acquisition of a permanent ultrasound image documenting patency of the accessed vessel. Under fluoroscopy, a Zoom 88 guide catheter was navigated over a 6 [place] Berenstein 2 catheter and a 0.035" Terumo Glidewire into the aortic arch. The catheter was placed into the right common carotid artery. Frontal and lateral angiograms of the head and neck were obtained. FINDINGS: Patent right common femoral artery. There is no occlusion of the intracranial right internal carotid just distal to the origin of the right posterior communicating artery. Minimal distal contrast  penetration is seen. PROCEDURE: Under biplane roadmap, the catheter construct was advanced over the Glidewire into the cervical right internal carotid artery. The Berenstein catheter was removed. Under fluoroscopy, a zoom 71 aspiration catheter was navigated through the zoom 88 guide catheter and over an Aristotle 18 micro guidewire into the supraclinoid right ICA. The zoom 71 catheter was connected to a penumbra aspiration pump in continuous aspiration was performed for approximately 1 minute. Continuous aspiration was also performed concomitantly in the zoom 88 catheter with a 60 cc vacuum pressure syringe. The aspiration catheter was removed under constant aspiration. The guiding catheter was aspirated until clear blood was obtained. Right internal carotid artery angiogram showed complete recanalization of the right ICA (TICI3). Delay right ICA angiograms showed persistent patency of the right ICA, right MCA ACA vascular trees. Flat panel CT of the head was obtained and post processed in a separate workstation with concurrent attending physician supervision. Selected images were sent to PACS. No evidence of hemorrhagic complication. A right common femoral artery angiogram was obtained with frontal view. The access is at the level of the right common femoral artery which has adequate caliber for closure device. The femoral sheath was exchanged over the wire for a Perclose prostyle which was utilized for access closure. Immediate hemostasis was achieved. Patient became hypotensive while awaiting COVID test result for extubation, after intervention was concluded and eventually progressed to PEA arrest. ROSC obtained after 2 rounds of chest compressions. Patient stabilized and transferred to ICU. Please see anesthesia documentation for further details. Family updated by telephone. IMPRESSION: 1. Successful mechanical thrombectomy for treatment of a right ICA terminus occlusion with direct contact aspiration. Total of  1 pass with complete recanalization (TICI 3). 2. No thromboembolic or hemorrhagic complication. 3. Postprocedural PEA cardiac arrest with ROSC obtained after 2 rounds of chest compression. Please see anesthesia documentation for further details. PLAN: 1. Transferd to ICU for further care. 2. Follow-up head CT/MRI within 24 hours. Electronically Signed   By: Pedro Earls M.D.   On: 05/13/2020 10:33   DG CHEST PORT 1 VIEW  Result Date: 05/13/2020 CLINICAL DATA:  Respiratory failure.  Hypoxia. EXAM: PORTABLE CHEST 1 VIEW COMPARISON:  05/12/2020. FINDINGS: NG tube noted with tip below left hemidiaphragm. Endotracheal tube and right IJ line in stable position. Severe cardiomegaly again noted. Diffuse bilateral pulmonary infiltrates/edema noted on today's exam. Left base atelectasis. Probable moderate left pleural effusion. No pneumothorax. IMPRESSION: 1. Interim placement of NG tube. Endotracheal tube right IJ line stable position. 2.  Severe cardiomegaly again noted. 3. Diffuse bilateral pulmonary infiltrates/edema noted on today's exam. Left base atelectasis. Probable moderate left pleural effusion. Electronically Signed   By: Marcello Moores  Register   On: 05/13/2020 05:56   Portable Chest x-ray  Result Date: 05/12/2020 CLINICAL DATA:  Stroke and respiratory failure. EXAM: PORTABLE CHEST 1 VIEW COMPARISON:  02/04/2020 FINDINGS: Stable cardiac enlargement. Endotracheal tube present with the tip approximately 3 cm above the carina. Right jugular central line present with the tip in the lower SVC. No pneumothorax. Left lower lobe atelectasis/consolidation present. No significant pulmonary edema or pleural fluid. IMPRESSION: Left lower lobe atelectasis/consolidation.  No pneumothorax. Electronically Signed   By: Aletta Edouard M.D.   On: 05/12/2020 16:25   ECHOCARDIOGRAM COMPLETE  Result Date: 05/12/2020    ECHOCARDIOGRAM REPORT   Patient Name:   [name] [name] Date of Exam: 05/12/2020 Medical Rec #:   [id number]         Height:       63.0 in Accession #:    [id number]        Weight:       198.0 lb Date of Birth:  [birth year]        BSA:          1.925 m Patient Age:    31 years          BP:           223/89 mmHg Patient Gender: F                 HR:           113 bpm. Exam Location:  Inpatient Procedure: 2D Echo, Cardiac Doppler and Color Doppler STAT ECHO REPORT CONTAINS CRITICAL RESULT Indications:    Stroke.; I50.40* Unspecified combined systolic (congestive) and                 diastolic (congestive) heart failure  History:        Patient has prior history of Echocardiogram examinations. CHF,                 Abnormal ECG, Stroke, Arrythmias:Atrial Fibrillation,                 Signs/Symptoms:Hypotension, Shortness of Breath and Dyspnea;                 Risk Factors:Hypertension and Dyslipidemia. Pericardial                 effusion.  Sonographer:    Roseanna Rainbow RDCS Referring Phys: 64 DAYNA N DUNN  Sonographer Comments: Technically difficult study due to poor echo windows, patient is morbidly obese and echo performed with patient supine and on artificial respirator. Image acquisition challenging due to patient body habitus. IMPRESSIONS  1. Left ventricular ejection fraction, by estimation, is <20%. The left ventricle has severely decreased function. The left ventricle demonstrates global hypokinesis. There is severe concentric left ventricular hypertrophy. Diastolic function is indeterminant due to atrial fibrillation. No LV thrombus visualized, however, apex incompletely visualized due to lack of definity contrast.  2. Right ventricular systolic function is severely reduced. The right ventricular size is mildly enlarged. There is mildly elevated pulmonary artery systolic pressure. The estimated right ventricular systolic pressure is 37.1 mmHg.  3. A large, circumferential pericardial effusion is present. The effusion appears to be greatest around the posterolateral LV measuring up to 2.2cm at end-diastole.  There is no RV or RA collapse to suggest tamponade. IVC is dilated and not collapsible but patient is on the vent with significant TR, which are likely the primary drivers of IVC dilation.  4. The mitral valve is normal in structure. Mild mitral valve regurgitation.  5. Tricuspid valve regurgitation is moderate to severe.  6. The aortic valve is  tricuspid. There is mild calcification of the aortic valve. There is mild thickening of the aortic valve. Aortic valve regurgitation is trivial. Mild aortic valve sclerosis is present, with no evidence of aortic valve stenosis.  7. Left atrial size was mildly dilated. Comparison(s): Compared to prior TTE in 01/2020, the LVEF is now severely reduced (<20%) and a large pericardial effusion is present (previously small). The RV systolic function now appears severely reduced. FINDINGS  Left Ventricle: Left ventricular ejection fraction, by estimation, is <20%. The left ventricle has severely decreased function. The left ventricle demonstrates global hypokinesis. The left ventricular internal cavity size was normal in size. There is severe concentric left ventricular hypertrophy. Diastolic function is indeterminant due to atrial fibrillation. No LV thrombus visualized, however, apex incompletely visualized due to lack of definity contrast. Right Ventricle: The right ventricular size is mildly enlarged. No increase in right ventricular wall thickness. Right ventricular systolic function is severely reduced. There is mildly elevated pulmonary artery systolic pressure. The tricuspid regurgitant velocity is 2.53 m/s, and with an assumed right atrial pressure of 15 mmHg, the estimated right ventricular systolic pressure is 24.5 mmHg. Left Atrium: Left atrial size was mildly dilated. Right Atrium: Right atrial size was normal in size. Pericardium: A large, circumferential pericardial effusion is present. The effusion appears to be greatest around the posterolateral LV measuring up to  2.2cm at end-diastole. There is no RV or RA collapse to suggest tamponade. IVC is dilated and not collapsible but patient is on the vent with significant TR, which are likely the primary drivers of IVC dilation. Mitral Valve: The mitral valve is normal in structure. There is mild thickening of the mitral valve leaflet(s). There is mild calcification of the mitral valve leaflet(s). Mild mitral annular calcification. Mild mitral valve regurgitation. Tricuspid Valve: The tricuspid valve is normal in structure. Tricuspid valve regurgitation is moderate to severe. The flow in the hepatic veins is reversed during ventricular systole. Aortic Valve: The aortic valve is tricuspid. There is mild calcification of the aortic valve. There is mild thickening of the aortic valve. Aortic valve regurgitation is trivial. Mild aortic valve sclerosis is present, with no evidence of aortic valve stenosis. Pulmonic Valve: The pulmonic valve was not well visualized. Pulmonic valve regurgitation is trivial. Aorta: The aortic root and ascending aorta are structurally normal, with no evidence of dilitation. IAS/Shunts: No atrial level shunt detected by color flow Doppler. Additional Comments: There is a small pleural effusion in the left lateral region.  LEFT VENTRICLE PLAX 2D LVIDd:         4.80 cm      Diastology LVIDs:         4.60 cm      LV e' medial:    4.13 cm/s LV PW:         1.40 cm      LV E/e' medial:  14.9 LV IVS:        1.60 cm      LV e' lateral:   6.90 cm/s LVOT diam:     2.10 cm      LV E/e' lateral: 8.9 LV SV:         21 LV SV Index:   11 LVOT Area:     3.46 cm  LV Volumes (MOD) LV vol d, MOD A2C: 104.0 ml LV vol d, MOD A4C: 63.1 ml LV vol s, MOD A2C: 97.8 ml LV vol s, MOD A4C: 54.0 ml LV SV MOD A2C:     6.2  ml LV SV MOD A4C:     63.1 ml LV SV MOD BP:      5.5 ml RIGHT VENTRICLE            IVC RV S prime:     5.70 cm/s  IVC diam: 2.20 cm TAPSE (M-mode): 1.0 cm LEFT ATRIUM             Index       RIGHT ATRIUM           Index  LA diam:        3.40 cm 1.77 cm/m  RA Area:     20.70 cm LA Vol (A2C):   53.4 ml 27.74 ml/m RA Volume:   65.50 ml  34.02 ml/m LA Vol (A4C):   60.4 ml 31.37 ml/m LA Biplane Vol: 59.7 ml 31.01 ml/m  AORTIC VALVE             PULMONIC VALVE LVOT Vmax:   58.70 cm/s  PR End Diast Vel: 2.11 msec LVOT Vmean:  34.400 cm/s LVOT VTI:    0.061 m  AORTA Ao Root diam: 3.30 cm MITRAL VALVE               TRICUSPID VALVE MV Area (PHT): 6.17 cm    TR Peak grad:   25.6 mmHg MV Decel Time: 123 msec    TR Vmax:        253.00 cm/s MV E velocity: 61.38 cm/s                            SHUNTS                            Systemic VTI:  0.06 m                            Systemic Diam: 2.10 cm Gwyndolyn Kaufman MD Electronically signed by Gwyndolyn Kaufman MD Signature Date/Time: 05/12/2020/7:24:37 PM    Final    US THYROID  Result Date: 05/15/2020 CLINICAL DATA:  Palpable abnormality.  Palpable thyroid nodule. EXAM: THYROID ULTRASOUND TECHNIQUE: Ultrasound examination of the thyroid gland and adjacent soft tissues was performed. COMPARISON:  None. FINDINGS: Parenchymal Echotexture: Normal Isthmus: Normal in size measuring 0.4 cm in diameter Right lobe: Enlarged measuring 5.6 x 3.1 x 4.1 cm Left lobe: Normal in size measuring 4.2 x 0.9 x 1.7 cm _________________________________________________________ Estimated total number of nodules >/= 1 cm: 1 Number of spongiform nodules >/=  2 cm not described below (TR1): 0 Number of mixed cystic and solid nodules >/= 1.5 cm not described below (TR2): 0 _________________________________________________________ Nodule # 1: Location: Right; Mid Maximum size: 4.9 cm; Other 2 dimensions: 3.9 x 3.2 cm Composition: solid/almost completely solid (2) Echogenicity: hypoechoic (2) Shape: not taller-than-wide (0) Margins: ill-defined (0) Echogenic foci: none (0) ACR TI-RADS total points: 4. ACR TI-RADS risk category: TR4 (4-6 points). ACR TI-RADS recommendations: **Given size (>/= 1.5 cm) and appearance,  fine needle aspiration of this moderately suspicious nodule should be considered based on TI-RADS criteria. _________________________________________________________ There is an approximately 0.6 x 0.5 x 0.4 cm anechoic cyst within the inferior pole the left lobe of the thyroid (labeled 2), which does not meet criteria to recommend percutaneous sampling or continued dedicated follow-up. IMPRESSION: 1. Right-sided thyroid nodule/mass, likely correlating with the palpable area of concern, meets imaging criteria to recommend  percutaneous sampling as indicated. 2. Additional solitary punctate left-sided thyroid cyst does not meet criteria to recommend percutaneous sampling or continued dedicated follow-up The above is in keeping with the ACR TI-RADS recommendations - J Am Coll Radiol 2017;14:587-595. Electronically Signed   By: Sandi Mariscal M.D.   On: 05/15/2020 08:27   IR PERCUTANEOUS ART THROMBECTOMY/INFUSION INTRACRANIAL INC DIAG ANGIO  Result Date: 05/13/2020 INDICATION: 70 year old female with past medical history significant for atrial fibrillation and CHF. She was last known well at 10 a.m. on 05/12/2020. At 11:30 a.m., she was noted to have facial droop and slurred speech. She was brought by EMS to ED. At presentation, she head left-sided hemiparesis with NIHSS 6. Head CT showed no evidence of a large territorial infarct or hemorrhage. CT angiogram showed an occlusion of the right ICA terminus extending into the proximal M1 and A1. IV tPA bolus was administered at 13:15. She was then taken to our service for a mechanical thrombectomy. EXAM: ULTRASOUND-GUIDED VASCULAR ACCESS DIAGNOSTIC CEREBRAL ANGIOGRAM MECHANICAL THROMBECTOMY FLAT PANEL HEAD CT COMPARISON:  CT/CT angiogram of the head and neck May 12, 2020 MEDICATIONS: Refer to anesthesia documentation ANESTHESIA/SEDATION: The procedure was performed in the general anesthesia. CONTRAST:  50 mL of Omnipaque 300. FLUOROSCOPY TIME:  Fluoroscopy Time: 8 minutes  (233 mGy). COMPLICATIONS: None immediate. TECHNIQUE: Informed written consent was obtained from the patient's daughter after a thorough discussion of the procedural risks, benefits and alternatives. All questions were addressed. Maximal Sterile Barrier Technique was utilized including caps, mask, sterile gowns, sterile gloves, sterile drape, hand hygiene and skin antiseptic. A timeout was performed prior to the initiation of the procedure. The right groin was prepped and draped in the usual sterile fashion. Using a micropuncture kit and the modified Seldinger technique, access was gained to the right common femoral artery and an 8 French sheath was placed. Real-time ultrasound guidance was utilized for vascular access including the acquisition of a permanent ultrasound image documenting patency of the accessed vessel. Under fluoroscopy, a Zoom 88 guide catheter was navigated over a 6 [place] Berenstein 2 catheter and a 0.035" Terumo Glidewire into the aortic arch. The catheter was placed into the right common carotid artery. Frontal and lateral angiograms of the head and neck were obtained. FINDINGS: Patent right common femoral artery. There is no occlusion of the intracranial right internal carotid just distal to the origin of the right posterior communicating artery. Minimal distal contrast penetration is seen. PROCEDURE: Under biplane roadmap, the catheter construct was advanced over the Glidewire into the cervical right internal carotid artery. The Berenstein catheter was removed. Under fluoroscopy, a zoom 71 aspiration catheter was navigated through the zoom 88 guide catheter and over an Aristotle 18 micro guidewire into the supraclinoid right ICA. The zoom 71 catheter was connected to a penumbra aspiration pump in continuous aspiration was performed for approximately 1 minute. Continuous aspiration was also performed concomitantly in the zoom 88 catheter with a 60 cc vacuum pressure syringe. The aspiration  catheter was removed under constant aspiration. The guiding catheter was aspirated until clear blood was obtained. Right internal carotid artery angiogram showed complete recanalization of the right ICA (TICI3). Delay right ICA angiograms showed persistent patency of the right ICA, right MCA ACA vascular trees. Flat panel CT of the head was obtained and post processed in a separate workstation with concurrent attending physician supervision. Selected images were sent to PACS. No evidence of hemorrhagic complication. A right common femoral artery angiogram was obtained with frontal view. The access  is at the level of the right common femoral artery which has adequate caliber for closure device. The femoral sheath was exchanged over the wire for a Perclose prostyle which was utilized for access closure. Immediate hemostasis was achieved. Patient became hypotensive while awaiting COVID test result for extubation, after intervention was concluded and eventually progressed to PEA arrest. ROSC obtained after 2 rounds of chest compressions. Patient stabilized and transferred to ICU. Please see anesthesia documentation for further details. Family updated by telephone. IMPRESSION: 1. Successful mechanical thrombectomy for treatment of a right ICA terminus occlusion with direct contact aspiration. Total of 1 pass with complete recanalization (TICI 3). 2. No thromboembolic or hemorrhagic complication. 3. Postprocedural PEA cardiac arrest with ROSC obtained after 2 rounds of chest compression. Please see anesthesia documentation for further details. PLAN: 1. Transferd to ICU for further care. 2. Follow-up head CT/MRI within 24 hours. Electronically Signed   By: Pedro Earls M.D.   On: 05/13/2020 10:33   CT HEAD CODE STROKE WO CONTRAST  Result Date: 05/12/2020 CLINICAL DATA:  Code stroke. Neuro deficit, acute, stroke suspected. Additional history provided: Left-sided weakness. EXAM: CT HEAD WITHOUT CONTRAST  TECHNIQUE: Contiguous axial images were obtained from the base of the skull through the vertex without intravenous contrast. COMPARISON:  Prior head CT examinations 05/10/2010. FINDINGS: Brain: Cerebral volume is normal. There is no acute intracranial hemorrhage. No demarcated cortical infarct. No extra-axial fluid collection. No evidence of intracranial mass. No midline shift. Vascular: No hyperdense vessel.  Atherosclerotic calcifications Skull: Normal. Negative for fracture or focal lesion. Sinuses/Orbits: Redemonstrated bilateral proptosis. Rightward gaze. No significant paranasal sinus disease. ASPECTS [facility] Stroke Program Early CT Score) - Ganglionic level infarction (caudate, lentiform nuclei, internal capsule, insula, M1-M3 cortex): 7 - Supraganglionic infarction (M4-M6 cortex): 3 Total score (0-10 with 10 being normal): 10 These results were communicated to Dr. Cheral Marker at 12:40 pmon 5/2/2022by text page via the [facility] messaging system. IMPRESSION: No evidence of acute intracranial abnormality.  ASPECTS is 10. Redemonstrated bilateral proptosis. Electronically Signed   By: Kellie Simmering DO   On: 05/12/2020 12:41   CT ANGIO HEAD CODE STROKE  Result Date: 05/12/2020 CLINICAL DATA:  Left-sided weakness EXAM: CT ANGIOGRAPHY HEAD AND NECK TECHNIQUE: Multidetector CT imaging of the head and neck was performed using the standard protocol during bolus administration of intravenous contrast. Multiplanar CT image reconstructions and MIPs were obtained to evaluate the vascular anatomy. Carotid stenosis measurements (when applicable) are obtained utilizing NASCET criteria, using the distal internal carotid diameter as the denominator. CONTRAST:  36mL OMNIPAQUE IOHEXOL 350 MG/ML SOLN COMPARISON:  Noncontrast head CT performed earlier today 05/12/2020. FINDINGS: CTA NECK FINDINGS Aortic arch: Standard aortic branching. No hemodynamically significant innominate or proximal subclavian artery stenosis. Right carotid  system: CCA and ICA patent within the neck without significant stenosis (50% or greater). Mild soft and calcified plaque within the carotid bifurcation and proximal ICA. Asymmetrically diminished enhancement of the cervical right ICA, likely due to downstream occlusion. Left carotid system: CCA and ICA patent within the neck without significant stenosis (50% or greater). Mild soft and calcified plaque within the carotid bifurcation and proximal ICA. Vertebral arteries: Patent within the neck without stenosis. Duplicated proximal left vertebral artery. Skeleton: No acute bony abnormality or aggressive osseous lesion. Cervical spondylosis. Other neck: Asymmetric prominence of the right thyroid lobe. Underlying right thyroid lobe nodule measuring at least 2.8 cm. Upper chest: Partially imaged left pleural effusion. Review of the MIP images confirms the above findings CTA  HEAD FINDINGS Anterior circulation: Asymmetrically diminished enhancement and caliber of the right ICA siphon, likely secondary to downstream occlusion. Absent enhancement within the right ICA terminus, proximal M1 right MCA and proximal A1 right ACA. Findings are compatible with T-shaped occlusion secondary to thrombus. There is some reconstitution of enhancement more distally within the M1 right MCA and within right M2 vessels. There is reconstitution of enhancement within the distal A1 right ACA. The intracranial left ICA is patent. No left M2 proximal branch occlusion or high-grade proximal stenosis is identified. Developmentally hypoplastic A1 left ACA. The A2 and more distal anterior cerebral arteries are patent. No intracranial aneurysm is identified. Posterior circulation: The intracranial vertebral arteries are patent. The basilar artery is patent. The posterior cerebral arteries are patent. A right posterior communicating artery is present. The left posterior communicating artery is hypoplastic or absent. Venous sinuses: Within the  limitations of contrast timing, no convincing thrombus. Anatomic variants: As described Review of the MIP images confirms the above findings Critical/emergent results were called by telephone at the time of interpretation on 05/12/2020 at 12:53 pm to provider ERIC [facility] , who verbally acknowledged these results. IMPRESSION: CTA neck: 1. The common carotid, internal carotid and vertebral arteries are patent within the neck without hemodynamically significant stenosis. Mild atherosclerotic disease within the carotid bifurcations and proximal ICAs. 2. Asymmetrically diminished enhancement of the cervical right ICA, likely secondary to downstream occlusion. 3. 2.8 cm right thyroid lobe nodule. Non-emergent thyroid ultrasound is recommended for further evaluation. CTA head: T-shaped large vessel occlusion involving the right ICA terminus, proximal M1 right middle cerebral artery and proximal A1 right anterior cerebral artery. Electronically Signed   By: Kellie Simmering DO   On: 05/12/2020 13:13    DISCHARGE EXAMINATION: See progress note from earlier today.    DISPOSITION: SNF  Discharge Instructions    (HEART FAILURE PATIENTS) Call MD:  Anytime you have any of the following symptoms: 1) 3 pound weight gain in 24 hours or 5 pounds in 1 week 2) shortness of breath, with or without a dry hacking cough 3) swelling in the hands, feet or stomach 4) if you have to sleep on extra pillows at night in order to breathe.   Complete by: As directed    Ambulatory referral to Neurology   Complete by: As directed    Follow up with stroke clinic NP (Jessica Vanschaick or Cecille Rubin, if both not available, consider Zachery Dauer, or A[facility]) at [facility] in about 4 weeks. Thanks.   Avoid straining   Complete by: As directed    Call MD for:  difficulty breathing, headache or visual disturbances   Complete by: As directed    Call MD for:  extreme fatigue   Complete by: As directed    Call MD for:  persistant dizziness or  light-headedness   Complete by: As directed    Call MD for:  persistant nausea and vomiting   Complete by: As directed    Call MD for:  severe uncontrolled pain   Complete by: As directed    Call MD for:  temperature >100.4   Complete by: As directed    Heart Failure patients record your daily weight using the same scale at the same time of day   Complete by: As directed    Increase activity slowly   Complete by: As directed    STOP any activity that causes chest pain, shortness of breath, dizziness, sweating, or exessive weakness   Complete by: As directed  Allergies as of 05/26/2020      Reactions   Amiodarone Shortness Of Breath, Other (See Comments)   Soreness, wheezing, and weakness also   Fruit & Vegetable Daily [nutritional Supplements] Shortness Of Breath, Swelling, Other (See Comments)   All fruits cause tongue swelling and numbness Organic fruits are okay to eat   Metoprolol Shortness Of Breath, Other (See Comments), Cough   Soreness, wheezing, and weakness also   Other Shortness Of Breath, Swelling, Other (See Comments)   Tree nuts cause tongue swelling and numbness   Peanut-containing Drug Products Shortness Of Breath, Swelling, Other (See Comments)   Tongue swelling and numbness   Aspirin Nausea And Vomiting, Other (See Comments)   Cannot take due to liver issues   Tylenol [acetaminophen] Other (See Comments)   Cannot take due to liver issues- tolerating 650 mg tablets in 2022   Beef-derived Products Other (See Comments)   Limited, per patient   Lactose Intolerance (gi) Diarrhea, Nausea And Vomiting      Medication List    STOP taking these medications   amLODipine 2.5 MG tablet Commonly known as: NORVASC   docusate sodium 100 MG capsule Commonly known as: COLACE   metolazone 5 MG tablet Commonly known as: ZAROXOLYN   metoprolol succinate 100 MG 24 hr tablet Commonly known as: TOPROL-XL   Ozempic (0.25 or 0.5 MG/DOSE) 2 MG/1.5ML Sopn Generic  drug: Semaglutide(0.25 or 0.5MG /DOS)   Tums Ultra 1000 400 MG chewable tablet Generic drug: calcium elemental as carbonate     TAKE these medications   (feeding supplement) PROSource Plus liquid Take 30 mLs by mouth 2 (two) times daily between meals.   feeding supplement Liqd Take 237 mLs by mouth 2 (two) times daily between meals.   accu-chek soft touch lancets Once daily testing.   acetaminophen 650 MG CR tablet Commonly known as: TYLENOL Take 650 mg by mouth daily as needed for pain.   amiodarone 200 MG tablet Commonly known as: PACERONE Take 1 tablet (200 mg total) by mouth 2 (two) times daily. What changed: Another medication with the same name was removed. Continue taking this medication, and follow the directions you see here.   atorvastatin 80 MG tablet Commonly known as: LIPITOR Take 1 tablet (80 mg total) by mouth daily. Start taking on: May 27, 2020   B-D UF III MINI PEN NEEDLES 31G X 5 MM Misc Generic drug: Insulin Pen Needle USE WITH LANTUS PEN DAILY AS DIRECTED   bisacodyl 10 MG suppository Commonly known as: DULCOLAX Place 1 suppository (10 mg total) rectally daily as needed for moderate constipation.   Blood Pressure Kit Monitor blood pressure twice a day   digoxin 0.125 MG tablet Commonly known as: LANOXIN Take 1 tablet (0.125 mg total) by mouth daily. Start taking on: May 27, 2020   Eliquis 5 MG Tabs tablet Generic drug: apixaban TAKE 1 TABLET(5 MG) BY MOUTH TWICE DAILY What changed: See the new instructions.   fexofenadine 180 MG tablet Commonly known as: ALLEGRA Take 180 mg by mouth daily.   furosemide 40 MG tablet Commonly known as: LASIX Take 40mg  daily as needed whenever weight is greater than 190lbs. What changed: See the new instructions.   glucose blood test strip Commonly known as: Accu-Chek Aviva Use as instructed   hydrALAZINE 10 MG tablet Commonly known as: APRESOLINE Take 1 tablet (10 mg total) by mouth every 8 (eight)  hours.   insulin glargine 100 UNIT/ML Solostar Pen Commonly known as: LANTUS Inject 18 Units  into the skin daily. What changed:   how much to take  when to take this  additional instructions   isosorbide mononitrate 30 MG 24 hr tablet Commonly known as: IMDUR Take 0.5 tablets (15 mg total) by mouth daily.   Magnesium Oxide 400 MG Caps Take 1 capsule (400 mg total) by mouth daily.   multivitamin with minerals Tabs tablet Take 1 tablet by mouth daily. Start taking on: May 27, 2020   oxyCODONE 5 MG immediate release tablet Commonly known as: Oxy IR/ROXICODONE Take 1 tablet (5 mg total) by mouth every 6 (six) hours as needed for severe pain.   pantoprazole 40 MG tablet Commonly known as: PROTONIX Take 1 tablet (40 mg total) by mouth daily. Start taking on: May 27, 2020   polyethylene glycol 17 g packet Commonly known as: MIRALAX / GLYCOLAX Take 17 g by mouth 2 (two) times daily.   senna-docusate 8.6-50 MG tablet Commonly known as: Senokot-S Take 2 tablets by mouth 2 (two) times daily.         Follow-up Information    [facility]. Schedule an appointment as soon as possible for a visit in 4 week(s).   Specialty: Neurology Contact information: [place] Follow up on 06/16/2020.   Specialty: Cardiology Why: June 6, at 2:30 PM  [facility] at [facility], [place] Contact information: [address] [id number] [place] [phone]              TOTAL DISCHARGE TIME: 28 minutes  Bonnielee Haff  [facility] Pager on [email or web address]  05/26/2020, 12:29 PM

## 2020-05-26 NOTE — TOC Progression Note (Signed)
Transition of Care (TOC) - Progression Note  Heart Failure   Patient Details  Name: [name] [name] MRN: [id number] Date of Birth: 08-Feb-1950  Transition of Care [facility]) CM/SW Contact  Callaway Hailes, LCSWA Phone Number: 05/26/2020, 10:43 AM  Clinical Narrative:    CSW received a message that the patient may be medically ready for discharge today depending on the cardiology team. CSW called Star at [facility] [phone] to see if they still have a bed available and Star confirmed that they do and to keep her updated about Ms. Choo's discharge summary so that she can provide a room number and CSW agreed. CSW will inform the family upon cardiology team decision upon patients discharge today. Patient's insurance authorization ends today awaiting next steps.  TOC will continue to follow through discharge.    Expected Discharge Plan: Skilled Nursing Facility Barriers to Discharge: Continued Medical Work up  Expected Discharge Plan and Services Expected Discharge Plan: Skilled Nursing Facility In-house Referral: Clinical Social Work Discharge Planning Services: CM Consult Post Acute Care Choice: Skilled Nursing Facility,IP Rehab (Patient and her daughter really want to go to CIR instead of SNF) Living arrangements for the past 2 months: Apartment                                       Social Determinants of Health (SDOH) Interventions Food Insecurity Interventions: Intervention Not Indicated Financial Strain Interventions: Intervention Not Indicated Housing Interventions: Intervention Not Indicated Transportation Interventions: Intervention Not Indicated,Other (Comment) (Patient reported relying on family for tranpsortation needs)  Readmission Risk Interventions Readmission Risk Prevention Plan 02/07/2020  Post Dischage Appt Complete  Medication Screening Complete  Transportation Screening Complete  Some recent data might be hidden   Rhiley Tarver, MSW,  LCSWA [phone] Heart Failure Social Worker

## 2020-05-26 NOTE — Progress Notes (Signed)
[facility] PROGRESS NOTE   [name] [name] LTJ:[id number] DOB: 11-25-1950 DOA: 05/12/2020  PCP: Dana Allan, MD  Brief History/Interval Summary: 70 year old lady with prior history of atrial fibrillation, chronic systolic heart failure with left ventricular ejection fraction of 25 to 30%, hypertension, hyperlipidemia, history of COVID-19 infection in the past  presented with left-sided weakness, required tPA and mechanical thrombectomy, had a cardiac arrest/PEA arrest immediately following her thrombectomy, underwent CPR for less than 5 minutes, intubated, started on pressors transferred to [facility] service.  Patient was extubated on 05/13/2020 and was weaned off vasopressors.  Patient also underwent TEE/DCCV on 05/19/20. She was transferred to [facility] on 05/20/2020.  Consultants:  Critical care medicine Heart failure team Palliative care  Procedures:  TEE/DC cardioversion on 5/9  Antibiotics: Anti-infectives (From admission, onward)   Start     Dose/Rate Route Frequency Ordered Stop   05/25/20 1000  cefTRIAXone (ROCEPHIN) 1 g in sodium chloride 0.9 % 100 mL IVPB  Status:  Discontinued        1 g 200 mL/hr over 30 Minutes Intravenous Every 24 hours 05/24/20 1021 05/24/20 1410   05/24/20 1800  ceFEPIme (MAXIPIME) 2 g in sodium chloride 0.9 % 100 mL IVPB        2 g 200 mL/hr over 30 Minutes Intravenous Every 12 hours 05/24/20 1542 05/26/20 2359   05/24/20 1500  ceFEPIme (MAXIPIME) 2 g in sodium chloride 0.9 % 100 mL IVPB  Status:  Discontinued        2 g 200 mL/hr over 30 Minutes Intravenous Every 12 hours 05/24/20 1410 05/24/20 1542   05/22/20 0915  cefTRIAXone (ROCEPHIN) 1 g in sodium chloride 0.9 % 100 mL IVPB        1 g 200 mL/hr over 30 Minutes Intravenous Every 24 hours 05/22/20 0825 05/24/20 0850      Subjective/Interval History: Patient denies pain this morning.  Denies any chest pain or shortness of breath.  Cannot recall when was the last time she had a bowel movement.        Assessment/Plan:  Acute embolic CVA She is status post revascularization with tPA and mechanical thrombectomy.  Seen by neurology.  Seen by PT and OT.  Plan is for skilled nursing facility when medically stable. LDL 61.  HbA1c 10.0.  Noted to be on statin.   Patient was initially on IV heparin and has been transitioned to apixaban.  Acute on chronic systolic CHF/cardiogenic shock/PEA arrest Patient went into cardiac arrest after she underwent tPA and mechanical thrombectomy for acute stroke.   Patient seen by heart failure team.  Patient was on milrinone infusion and on diuretics.  Subsequently taken off of milrinone.  Remains on digoxin.  Spironolactone was discontinued due to hyperkalemia. Patient was given a dose of furosemide over the weekend for dyspnea.  Feels better.  Await final recommendations from heart failure team.  Hyperkalemia Potassium level has improved after multiple doses of Lokelma.  Spironolactone was discontinued.  Hyponatremia Given tolvaptan 5/10.  Sodium level has improved and remains at 127.    Atrial fibrillation with RVR Stable.  Currently in sinus rhythm.  Remains on amiodarone and anticoagulated with Eliquis.    Diabetes mellitus type 2, uncontrolled with hyperglycemia HbA1c 10.5.  Patient currently on Lantus insulin and SSI.  Insulin dose has been adjusted based on glucose levels.  Dose was decreased a few days ago due to episodes of hypoglycemia.  CBGs are stable.  Leukocytosis/possible UTI Patient with persistent rise in WBC  count without any clear-cut reason.  UA was abnormal.  Patient was unable to tell if she had any urinary symptoms.  Plan was to give her ceftriaxone for 3 days.  Cultures growing Pseudomonas and Klebsiella although less than 100,000 colonies.  Cefepime initiated on 5/14.  We will continue to discharge or for total of 7 days whichever comes early.   WBC noted to be better today at 17.1.  Differential does show a left-sided shift.   Peripheral smear examination is pending.    Anemia of chronic disease Gradual drop in hemoglobin was noted.  No overt bleeding identified.  She has had brown stool.  Stool for occult blood testing has been ordered.  Not done yet.   Patient was given 2 doses of Feraheme during this hospitalization.  Hemoglobin stable.   Anemia panel checked on 5/6 showed ferritin to be 124, iron 20, TIBC 305, folate level 11.2.  B12 level was 2001. Will need further work-up in the outpatient setting once she is over her acute illness.  Bipolar disorder Outpatient follow-up.  Constipation Last bowel movement was on the 13th.  TSH was 1.4 earlier this month.  Patient is on once a day of MiraLAX and twice a day Senokot-S.  We will increase MiraLAX to twice daily.  Give her an enema today.  Obesity Estimated body mass index is 33.31 kg/m as calculated from the following:   Height as of this encounter: 5\' 3"  (1.6 m).   Weight as of this encounter: 85.3 kg.   DVT Prophylaxis: Apixaban Code Status: Full code Family Communication: Discussed with the patient.  Patient requests I update her daughter which I will do so today. Disposition Plan: SNF when medically stable and when cleared by heart failure team  Status is: Inpatient  Remains inpatient appropriate because:IV treatments appropriate due to intensity of illness or inability to take PO   Dispo: The patient is from: Home              Anticipated d/c is to: SNF              Patient currently is not medically stable to d/c.   Difficult to place patient No         Medications:  Scheduled: . (feeding supplement) PROSource Plus  30 mL Oral BID BM  . amiodarone  200 mg Oral BID  . apixaban  5 mg Oral BID  . atorvastatin  80 mg Oral Daily  . digoxin  0.125 mg Oral Daily  . famotidine  20 mg Oral Q2000  . feeding supplement  237 mL Oral BID BM  . insulin aspart  0-15 Units Subcutaneous TID WC  . insulin glargine  15 Units Subcutaneous QHS  .  lidocaine  1 patch Transdermal Q24H  . multivitamin with minerals  1 tablet Oral Daily  . pantoprazole  40 mg Oral Daily  . polyethylene glycol  17 g Oral Daily  . senna-docusate  2 tablet Oral BID   Continuous: . sodium chloride Stopped (05/16/20 0600)  . ceFEPime (MAXIPIME) IV 2 g (05/25/20 2221)   2222 chloride, bisacodyl, ondansetron (ZOFRAN) IV, oxyCODONE, simethicone   Objective:  Vital Signs  Vitals:   05/25/20 1920 05/25/20 2334 05/26/20 0010 05/26/20 0801  BP: (!) 150/81 (!) 156/83 (!) 155/77 (!) 159/87  Pulse: 97 95 93 91  Resp: 19 (!) 21 20 17   Temp:  98.5 F (36.9 C) 98.4 F (36.9 C) 98 F (36.7 C)  TempSrc:  Oral Oral  Oral  SpO2: 100% 100% 100% 100%  Weight:      Height:        Intake/Output Summary (Last 24 hours) at 05/26/2020 0857 Last data filed at 05/26/2020 0725 Gross per 24 hour  Intake 420 ml  Output 75 ml  Net 345 ml   Filed Weights   05/21/20 0018 05/22/20 0000 05/23/20 0636  Weight: 84.5 kg 85.3 kg 85.3 kg    General appearance: Awake alert.  In no distress Resp: Normal effort noted today.  Diminished air entry at the bases though improved compared to few days ago.  No wheezing or rhonchi. Cardio: S1-S2 is normal regular.  No S3-S4.  No rubs murmurs or bruit GI: Abdomen is soft.  Nontender nondistended.  Bowel sounds are present normal.  No masses organomegaly Extremities: Mild edema bilateral lower extremities.     Lab Results:  Data Reviewed: I have personally reviewed following labs and imaging studies  CBC: Recent Labs  Lab 05/22/20 0354 05/23/20 0155 05/24/20 0311 05/25/20 0454 05/26/20 0433  WBC 20.8* 20.3* 19.4* 21.8* 17.1*  NEUTROABS  --   --   --   --  13.5*  HGB 8.5* 7.9* 8.3* 8.3* 8.3*  HCT 25.5* 24.0* 25.0* 25.6* 26.3*  MCV 77.3* 76.4* 79.4* 79.5* 80.7  PLT 279 290 258 312 313    Basic Metabolic Panel: Recent Labs  Lab 05/20/20 0441 05/20/20 2307 05/22/20 0354 05/22/20 1220 05/23/20 0155  05/24/20 0311 05/25/20 0454 05/26/20 0433  NA 123*   < > 126*  --  125* 127* 127* 127*  K 4.0   < > 5.6* 5.3* 5.0 5.4* 5.2* 4.5  CL 75*   < > 79*  --  80* 83* 84* 83*  CO2 40*   < > 38*  --  36* 33* 33* 33*  GLUCOSE 126*   < > 85  --  219* 163* 170* 198*  BUN 17   < > 23  --  26* 30* 32* 31*  CREATININE 1.40*   < > 1.64*  --  1.54* 1.43* 1.44* 1.35*  CALCIUM 8.6*   < > 9.5  --  9.2 9.3 9.2 8.9  MG 2.0  --   --   --   --   --   --   --    < > = values in this interval not displayed.    GFR: Estimated Creatinine Clearance: 41.3 mL/min (A) (by C-G formula based on SCr of 1.35 mg/dL (H)).  Liver Function Tests: Recent Labs  Lab 05/20/20 0441 05/23/20 0155  AST 37 24  ALT 37 25  ALKPHOS 194* 151*  BILITOT 1.3* 1.1  PROT 6.1* 6.6  ALBUMIN 2.1* 2.2*     Coagulation Profile: Recent Labs  Lab 05/19/20 1045  INR 1.3*    CBG: Recent Labs  Lab 05/25/20 0612 05/25/20 1256 05/25/20 1614 05/25/20 2103 05/26/20 0613  GLUCAP 172* 203* 174* 242* 197*      Recent Results (from the past 240 hour(s))  Culture, Urine     Status: Abnormal   Collection Time: 05/22/20  8:26 AM   Specimen: Urine, Random  Result Value Ref Range Status   Specimen Description URINE, RANDOM  Final   Special Requests   Final    NONE Performed at [facility], [address] [place], [place] [address]    Culture (A)  Final    80,000 COLONIES/mL PSEUDOMONAS AERUGINOSA 70,000 COLONIES/mL KLEBSIELLA PNEUMONIAE    Report Status 05/24/2020  FINAL  Final   Organism ID, Bacteria PSEUDOMONAS AERUGINOSA (A)  Final   Organism ID, Bacteria KLEBSIELLA PNEUMONIAE (A)  Final      Susceptibility   Klebsiella pneumoniae - MIC*    AMPICILLIN >=32 RESISTANT Resistant     CEFAZOLIN <=4 SENSITIVE Sensitive     CEFEPIME <=0.12 SENSITIVE Sensitive     CEFTRIAXONE <=0.25 SENSITIVE Sensitive     CIPROFLOXACIN <=0.25 SENSITIVE Sensitive     GENTAMICIN <=1 SENSITIVE Sensitive     IMIPENEM <=0.25 SENSITIVE  Sensitive     NITROFURANTOIN 128 RESISTANT Resistant     TRIMETH/SULFA <=20 SENSITIVE Sensitive     AMPICILLIN/SULBACTAM 4 SENSITIVE Sensitive     PIP/TAZO <=4 SENSITIVE Sensitive     * 70,000 COLONIES/mL KLEBSIELLA PNEUMONIAE   Pseudomonas aeruginosa - MIC*    CEFTAZIDIME 4 SENSITIVE Sensitive     CIPROFLOXACIN <=0.25 SENSITIVE Sensitive     GENTAMICIN 2 SENSITIVE Sensitive     IMIPENEM 1 SENSITIVE Sensitive     PIP/TAZO 8 SENSITIVE Sensitive     CEFEPIME 2 SENSITIVE Sensitive     * 80,000 COLONIES/mL PSEUDOMONAS AERUGINOSA      Radiology Studies: No results found.     LOS: 14 days   Magaby Rumberger [employer] on [email or web address]  05/26/2020, 8:57 AM

## 2020-05-26 NOTE — Progress Notes (Signed)
Orthopedic Tech Progress Note Patient Details:  [name] [name] 1950/12/15 [id number]  Ortho Devices Type of Ortho Device: [name] boot Ortho Device/Splint Location: BLE Ortho Device/Splint Interventions: Ordered,Application   Post Interventions Patient Tolerated: Well Instructions Provided: Care of device   Donald Pore 05/26/2020, 5:42 PM

## 2020-05-26 NOTE — Progress Notes (Signed)
Physical Therapy Treatment Patient Details Name: [name] [name] MRN: [id number] DOB: 1950/05/08 Today's Date: 05/26/2020    History of Present Illness Pt is a 70 y.o. female admitted 05/12/20 with L-side weakness. Pt received tPA, s/p mechanical thrombectomy; post-procedure PEA arrest, received CPR <5-min. CTA showed R MCA occlusion with successful revascularization. ETT 5/2-5/3. S/p TEEE/DCCV on 5/9 with return to [facility]. PMH includes DM, HF, HTN, afib, COVID (01/2020).    PT Comments    Pt was seen for mobility on RW with assistance to chair after doing orthostatic check with nursing in attendance.  Pt was semi-sitting 199/91, sitting 193/88 and standing 182/85.  Progressive decline but certainly not orthostatic.  Could not tolerate full supine nor did she tolerate standing well. Follow up with her to increase standing control and tolerance as her condition permits.   Follow Up Recommendations  SNF     Equipment Recommendations  Rolling walker with 5" wheels;3in1 (PT)    Recommendations for Other Services       Precautions / Restrictions Precautions Precautions: Fall Precaution Comments: monitor sats and BP Restrictions Weight Bearing Restrictions: No    Mobility  Bed Mobility Overal bed mobility: Needs Assistance Bed Mobility: Supine to Sit     Supine to sit: Min assist     General bed mobility comments: assisted legs then trunk to sit up, pt is slow to follow along    Transfers Overall transfer level: Needs assistance Equipment used: Rolling walker (2 wheeled) Transfers: Sit to/from Stand Sit to Stand: Min assist Stand pivot transfers: Min assist       General transfer comment: min assist to power up and then to pivot to chair  Ambulation/Gait Ambulation/Gait assistance: Min guard Gait Distance (Feet): 4 Feet Assistive device: Rolling walker (2 wheeled) Gait Pattern/deviations: Step-to pattern;Shuffle;Wide base of support Gait velocity: Decreased Gait  velocity interpretation: <1.31 ft/sec, indicative of household ambulator General Gait Details: limited trip due to complaints of being light headed   Social research officer, government Rankin (Stroke Patients Only)       Balance Overall balance assessment: Needs assistance Sitting-balance support: Feet supported Sitting balance-Leahy Scale: Fair       Standing balance-Leahy Scale: Poor Standing balance comment: requires walker and directional cues                            Cognition Arousal/Alertness: Awake/alert Behavior During Therapy: Flat affect Overall Cognitive Status: No family/caregiver present to determine baseline cognitive functioning Area of Impairment: Problem solving;Awareness;Safety/judgement;Following commands;Memory;Attention;Orientation                 Orientation Level: Situation Current Attention Level: Selective Memory: Decreased short-term memory Following Commands: Follows one step commands inconsistently;Follows one step commands with increased time Safety/Judgement: Decreased awareness of safety Awareness: Intellectual Problem Solving: Slow processing;Requires verbal cues;Requires tactile cues General Comments: limited tolerance for standing      Exercises      General Comments General comments (skin integrity, edema, etc.): pt is limited for mobility but BP's were high, per nsg had just received meds      Pertinent Vitals/Pain Pain Assessment: No/denies pain    Home Living                      Prior Function            PT Goals (current  goals can now be found in the care plan section) Acute Rehab PT Goals Patient Stated Goal: get home Progress towards PT goals: Progressing toward goals    Frequency    Min 2X/week      PT Plan Current plan remains appropriate    Co-evaluation              AM-PAC PT "6 Clicks" Mobility   Outcome Measure  Help needed turning from  your back to your side while in a flat bed without using bedrails?: A Little Help needed moving from lying on your back to sitting on the side of a flat bed without using bedrails?: A Little Help needed moving to and from a bed to a chair (including a wheelchair)?: A Little Help needed standing up from a chair using your arms (e.g., wheelchair or bedside chair)?: A Little Help needed to walk in hospital room?: A Little Help needed climbing 3-5 steps with a railing? : A Lot 6 Click Score: 17    End of Session Equipment Utilized During Treatment: Gait belt;Oxygen Activity Tolerance: Patient limited by fatigue;Treatment limited secondary to medical complications (Comment) Patient left: in chair;with call bell/phone within reach;with chair alarm set Nurse Communication: Mobility status PT Visit Diagnosis: Unsteadiness on feet (R26.81);Other abnormalities of gait and mobility (R26.89);Muscle weakness (generalized) (M62.81);Difficulty in walking, not elsewhere classified (R26.2)     Time: 4917-9150 PT Time Calculation (min) (ACUTE ONLY): 29 min  Charges:  $Gait Training: 8-22 mins $Therapeutic Activity: 8-22 mins                   Ivar Drape 05/26/2020, 1:09 PM Samul Dada, PT MS Acute Rehab Dept. Number: [facility] [id number] and [facility] [phone]

## 2020-05-26 NOTE — Discharge Instructions (Signed)
Heart Failure, Diagnosis  Heart failure means that your heart is not able to pump blood in the right way. This makes it hard for your body to work well. Heart failure is usually a long-term (chronic) condition. You must take good care of yourself and follow your treatment plan from your doctor. What are the causes?  High blood pressure.  Buildup of cholesterol and fat in the arteries.  Heart attack. This injures the heart muscle.  Heart valves that do not open and close properly.  Damage of the heart muscle. This is also called cardiomyopathy.  Infection of the heart muscle. This is also called myocarditis.  Lung disease. What increases the risk?  Getting older. The risk of heart failure goes up as a person ages.  Being overweight.  Being female.  Use tobacco or nicotine products.  Abusing alcohol or drugs.  Having taken medicines that can damage the heart.  Having any of these conditions: ? Diabetes. ? Abnormal heart rhythms. ? Thyroid problems. ? Low blood counts (anemia).  Having a family history of heart failure. What are the signs or symptoms?  Shortness of breath.  Coughing.  Swelling of the feet, ankles, legs, or belly.  Losing or gaining weight for no reason.  Trouble breathing.  Waking from sleep because of the need to sit up and get more air.  Fast heartbeat.  Being very tired.  Feeling dizzy, or feeling like you may pass out (faint).  Having no desire to eat.  Feeling like you may vomit (nauseous).  Peeing (urinating) more at night.  Feeling confused. How is this treated? This condition may be treated with:  Medicines. These can be given to treat blood pressure and to make the heart muscles stronger.  Changes in your daily life. These may include: ? Eating a healthy diet. ? Staying at a healthy body weight. ? Quitting tobacco, alcohol, and drug use. ? Doing exercises. ? Participating in a cardiac rehabilitation program. This program  helps you improve your health through exercise, education, and counseling.  Surgery. Surgery can be done to open blocked valves, or to put devices in the heart, such as pacemakers.  A donor heart (heart transplant). You will receive a healthy heart from a donor. Follow these instructions at home:  Treat other conditions as told by your doctor. These may include high blood pressure, diabetes, thyroid disease, or abnormal heart rhythms.  Learn as much as you can about heart failure.  Get support as you need it.  Keep all follow-up visits. Summary  Heart failure means that your heart is not able to pump blood in the right way.  This condition is often caused by high blood pressure, heart attack, or damage of the heart muscle.  Symptoms of this condition include shortness of breath and swelling of the feet, ankles, legs, or belly. You may also feel very tired or feel like you may vomit.  You may be treated with medicines, surgery, or changes in your daily life.  Treat other health conditions as told by your doctor. This information is not intended to replace advice given to you by your health care provider. Make sure you discuss any questions you have with your health care provider. Document Revised: 07/21/2019 Document Reviewed: 07/21/2019 Elsevier Patient Education  2021 Elsevier Inc.  

## 2020-05-26 NOTE — TOC Transition Note (Addendum)
Transition of Care [facility]) - CM/SW Discharge Note Heart Failure   Patient Details  Name: [name] [name] MRN: [id number] Date of Birth: 05/04/1950  [facility]) CM/SW Contact:  [name], LCSWA Phone Number: 05/26/2020, 12:38 PM   Clinical Narrative:    Patient will DC to: [facility] SNF Anticipated DC date: 05/26/2020 Family notified: Patients daughter [name] [phone] Transport by: [name]   Per MD patient ready for DC to [facility]. RN to call report prior to discharge [phone] RM# [id number]). RN, patient, patient's family, and facility notified of DC. Discharge Summary and FL2 sent to facility. DC packet on chart. Awaiting COVID test results before calling ambulance transport request for patient.  4:02pm: CSW called [facility] for ambulance transport and [facility] reported next available will be in 2 hours and hopefully by that time the patients COVID results will be back. CSW will continue to check for COVID test results. 5:00pm - COVID test results came back negative. CSW notified [name] and patients daughter [name] she didn't answer and CSW left her a voicemail with the updated information. [facility] has been called for ambulance transport to [place].  CSW will sign off for now as social work intervention is no longer needed. Please consult [place] again if new needs arise.     Final next level of care: Skilled Nursing Facility Barriers to Discharge: No Barriers Identified   Patient Goals and CMS Choice Patient states their goals for this hospitalization and ongoing recovery are:: to go to [facility] in the hospital for rehab before going home CMS Medicare.gov Compare Post Acute Care list provided to:: Patient Choice offered to / list presented to : Patient,Adult Children  Discharge Placement              Patient chooses bed at: [name] Patient to be transferred to facility by: [facility] Name of family member notified: Patients Daughter [name] [phone] Patient and family notified  of of transfer: 05/26/20  Discharge Plan and Services In-house Referral: Clinical Social Work Discharge Planning Services: CM Consult Post Acute Care Choice: Skilled Nursing Facility,IP Rehab (Patient and her daughter really want to go to CIR instead of SNF)                               Social Determinants of Health (SDOH) Interventions Food Insecurity Interventions: Intervention Not Indicated Financial Strain Interventions: Intervention Not Indicated Housing Interventions: Intervention Not Indicated Transportation Interventions: Intervention Not Indicated,Other (Comment) (Patient reported relying on family for tranpsortation needs)   Readmission Risk Interventions Readmission Risk Prevention Plan 02/07/2020  Post Dischage Appt Complete  Medication Screening Complete  Transportation Screening Complete  Some recent data might be hidden    Oliva Montecalvo, MSW, LCSWA [phone] Heart Failure Social Worker

## 2020-05-26 NOTE — NC FL2 (Signed)
[place] MEDICAID FL2 LEVEL OF CARE SCREENING TOOL     IDENTIFICATION  Patient Name: [name] [name] Birthdate: [birth year] Sex: female Admission Date (Current Location): 05/12/2020  [facility] and [place] Number:  Producer, television/film/video and Address:  [facility], [address], [place], [place] [address]      Provider Number: [id number]  Attending Physician Name and Address:  Osvaldo Shipper, MD  Relative Name and Phone Number:  Mariel Craft [phone]    Current Level of Care: Hospital Recommended Level of Care: Skilled Nursing Facility Prior Approval Number:    Date Approved/Denied:   PASRR Number: [id number] E end date 06/20/2020  Discharge Plan: SNF    Current Diagnoses: Patient Active Problem List   Diagnosis Date Noted  . 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 Stroke (HCC) 05/12/2020  . Acute respiratory failure with hypoxia (HCC)   . Bilateral leg edema 03/18/2020  . HFrEF (heart failure with reduced ejection fraction) (HCC) 03/18/2020  . Nonrheumatic tricuspid valve regurgitation 03/18/2020  . Gastroesophageal reflux disease 03/12/2020  . Wears glasses   . Liver mass   . Lack of adequate sleep   . Hypertension   . Full dentures   . Fatigue   . Diabetes mellitus without complication (HCC)   . SOB (shortness of breath)   . A-fib (HCC) 02/06/2020  . COVID-19 02/04/2020  . Chronic anticoagulation - on Eliquis for chronic afib 02/04/2020  . Chronic combined systolic and diastolic CHF (congestive heart failure) (HCC) 02/04/2020  . Oral thrush 02/04/2020  . Acute cystitis 02/04/2020  . Acute combined systolic and diastolic heart failure (HCC) 02/03/2020  . Morbid obesity (HCC) 02/03/2020  . Atrial fibrillation with RVR (HCC) 02/03/2020  . Dysphagia 02/03/2020  . Epigastric pain 02/03/2020  . Atrial tachycardia (HCC) 07/29/2019  . History of esophageal stricture 07/29/2019  . Atrial fibrillation with rapid ventricular response  (HCC) 07/26/2019  . Rash 12/30/2017  . Healthcare maintenance 09/07/2017  . Osteopenia after menopause 06/09/2017  . Uncontrolled type 2 diabetes mellitus with hyperglycemia (HCC) 06/08/2017  . Back pain 11/15/2014  . Pericardial effusion 05/12/2010  . HTN (hypertension) 05/12/2010  . Mixed hyperlipidemia 05/12/2010    Orientation RESPIRATION BLADDER Height & Weight     Self,Time,Situation,Place  O2 (Nasal Cannula 2L) Continent,Indwelling catheter Weight: 188 lb 0.8 oz (85.3 kg) Height:  5\' 3"  (160 cm)  BEHAVIORAL SYMPTOMS/MOOD NEUROLOGICAL BOWEL NUTRITION STATUS      Continent Diet (See DC Summary)  AMBULATORY STATUS COMMUNICATION OF NEEDS Skin   Limited Assist Verbally Normal                       Personal Care Assistance Level of Assistance  Bathing,Feeding,Dressing Bathing Assistance: Limited assistance Feeding assistance: Limited assistance Dressing Assistance: Limited assistance     Functional Limitations Info  Sight,Hearing,Speech Sight Info: Adequate Hearing Info: Adequate Speech Info: Adequate    SPECIAL CARE FACTORS FREQUENCY  PT (By licensed PT),OT (By licensed OT)     PT Frequency: 5X/per week OT Frequency: 5X/per week            Contractures Contractures Info: Not present    Additional Factors Info  Code Status,Allergies Code Status Info: Full Allergies Info: Fruit & Vegetable Daily (Nutritional Supplements), Other, Peanut-containing Drug Products, Aspirin, Tylenol (Acetaminophen)           Current Medications (05/26/2020):  This is the current hospital active medication list Current Facility-Administered Medications  Medication Dose Route Frequency Provider  Last Rate Last Admin  . (feeding supplement) PROSource Plus liquid 30 mL  30 mL Oral BID BM Osvaldo Shipper, MD   30 mL at 05/24/20 1149  . 0.9 %  sodium chloride infusion   Intravenous PRN Lynnell Catalan, MD   Stopped at 05/16/20 0600  . amiodarone (PACERONE) tablet 200 mg  200 mg  Oral BID Lynnell Catalan, MD   200 mg at 05/25/20 2106  . apixaban (ELIQUIS) tablet 5 mg  5 mg Oral BID Gardner Candle, RPH   5 mg at 05/25/20 2105  . atorvastatin (LIPITOR) tablet 80 mg  80 mg Oral Daily Lynnell Catalan, MD   80 mg at 05/25/20 1155  . bisacodyl (DULCOLAX) suppository 10 mg  10 mg Rectal Daily PRN Kathlen Mody, MD   10 mg at 05/25/20 2132  . ceFEPIme (MAXIPIME) 2 g in sodium chloride 0.9 % 100 mL IVPB  2 g Intravenous Q12H Leander Rams, RPH 200 mL/hr at 05/25/20 2221 2 g at 05/25/20 2221  . digoxin (LANOXIN) tablet 0.125 mg  0.125 mg Oral Daily Agarwala, Ravi, MD   0.125 mg at 05/25/20 1156  . famotidine (PEPCID) tablet 20 mg  20 mg Oral Q2000 Lynnell Catalan, MD   20 mg at 05/25/20 2105  . feeding supplement (ENSURE ENLIVE / ENSURE PLUS) liquid 237 mL  237 mL Oral BID BM Agarwala, Ravi, MD   237 mL at 05/24/20 1032  . insulin aspart (novoLOG) injection 0-15 Units  0-15 Units Subcutaneous TID WC Osvaldo Shipper, MD   3 Units at 05/26/20 0640  . insulin glargine (LANTUS) injection 15 Units  15 Units Subcutaneous QHS Osvaldo Shipper, MD   15 Units at 05/25/20 2130  . lidocaine (LIDODERM) 5 % 1 patch  1 patch Transdermal Q24H Lynnell Catalan, MD   1 patch at 05/24/20 1801  . multivitamin with minerals tablet 1 tablet  1 tablet Oral Daily Osvaldo Shipper, MD   1 tablet at 05/25/20 1156  . ondansetron (ZOFRAN) injection 4 mg  4 mg Intravenous Q8H PRN Lynnell Catalan, MD   4 mg at 05/20/20 1208  . oxyCODONE (Oxy IR/ROXICODONE) immediate release tablet 5 mg  5 mg Oral Q4H PRN Lynnell Catalan, MD   5 mg at 05/26/20 0718  . pantoprazole (PROTONIX) EC tablet 40 mg  40 mg Oral Daily Lynnell Catalan, MD   40 mg at 05/25/20 1157  . polyethylene glycol (MIRALAX / GLYCOLAX) packet 17 g  17 g Oral BID Osvaldo Shipper, MD      . senna-docusate (Senokot-S) tablet 2 tablet  2 tablet Oral BID Kathlen Mody, MD   2 tablet at 05/25/20 2105  . simethicone (MYLICON) chewable tablet 80 mg  80 mg Oral QID PRN  Lynnell Catalan, MD   80 mg at 05/19/20 1602  . sodium phosphate (FLEET) 7-19 GM/118ML enema 1 enema  1 enema Rectal Once Osvaldo Shipper, MD         Discharge Medications: Please see discharge summary for a list of discharge medications.  Relevant Imaging Results:  Relevant Lab Results:   Additional Information SS#: [id number] Moderna COVID-19 Vaccine 08/07/19, 07/10/19  Jalayiah Bibian, LCSWA

## 2020-05-27 DIAGNOSIS — K5909 Other constipation: Secondary | ICD-10-CM | POA: Diagnosis not present

## 2020-05-27 DIAGNOSIS — R141 Gas pain: Secondary | ICD-10-CM | POA: Diagnosis not present

## 2020-05-27 DIAGNOSIS — I4891 Unspecified atrial fibrillation: Secondary | ICD-10-CM | POA: Diagnosis not present

## 2020-05-27 DIAGNOSIS — I63111 Cerebral infarction due to embolism of right vertebral artery: Secondary | ICD-10-CM | POA: Diagnosis not present

## 2020-05-29 ENCOUNTER — Other Ambulatory Visit: Payer: Self-pay | Admitting: Family Medicine

## 2020-05-29 DIAGNOSIS — M6281 Muscle weakness (generalized): Secondary | ICD-10-CM | POA: Diagnosis not present

## 2020-05-29 DIAGNOSIS — R2681 Unsteadiness on feet: Secondary | ICD-10-CM | POA: Diagnosis not present

## 2020-05-29 DIAGNOSIS — M791 Myalgia, unspecified site: Secondary | ICD-10-CM | POA: Diagnosis not present

## 2020-05-29 DIAGNOSIS — G8194 Hemiplegia, unspecified affecting left nondominant side: Secondary | ICD-10-CM | POA: Diagnosis not present

## 2020-05-29 DIAGNOSIS — E1159 Type 2 diabetes mellitus with other circulatory complications: Secondary | ICD-10-CM | POA: Diagnosis not present

## 2020-05-29 DIAGNOSIS — E119 Type 2 diabetes mellitus without complications: Secondary | ICD-10-CM | POA: Diagnosis not present

## 2020-05-29 DIAGNOSIS — Z8616 Personal history of COVID-19: Secondary | ICD-10-CM | POA: Diagnosis not present

## 2020-05-29 DIAGNOSIS — I110 Hypertensive heart disease with heart failure: Secondary | ICD-10-CM | POA: Diagnosis not present

## 2020-05-29 DIAGNOSIS — I152 Hypertension secondary to endocrine disorders: Secondary | ICD-10-CM | POA: Diagnosis not present

## 2020-05-29 DIAGNOSIS — I4891 Unspecified atrial fibrillation: Secondary | ICD-10-CM | POA: Diagnosis not present

## 2020-05-29 DIAGNOSIS — Z794 Long term (current) use of insulin: Secondary | ICD-10-CM | POA: Diagnosis not present

## 2020-05-29 DIAGNOSIS — I10 Essential (primary) hypertension: Secondary | ICD-10-CM | POA: Diagnosis not present

## 2020-05-29 DIAGNOSIS — I639 Cerebral infarction, unspecified: Secondary | ICD-10-CM | POA: Diagnosis not present

## 2020-05-29 DIAGNOSIS — I462 Cardiac arrest due to underlying cardiac condition: Secondary | ICD-10-CM | POA: Diagnosis not present

## 2020-05-30 DIAGNOSIS — I10 Essential (primary) hypertension: Secondary | ICD-10-CM | POA: Diagnosis not present

## 2020-05-30 DIAGNOSIS — I110 Hypertensive heart disease with heart failure: Secondary | ICD-10-CM | POA: Diagnosis not present

## 2020-05-30 DIAGNOSIS — G8929 Other chronic pain: Secondary | ICD-10-CM | POA: Diagnosis not present

## 2020-05-30 DIAGNOSIS — M5489 Other dorsalgia: Secondary | ICD-10-CM | POA: Diagnosis not present

## 2020-05-30 DIAGNOSIS — I480 Paroxysmal atrial fibrillation: Secondary | ICD-10-CM | POA: Diagnosis not present

## 2020-05-30 DIAGNOSIS — I152 Hypertension secondary to endocrine disorders: Secondary | ICD-10-CM | POA: Diagnosis not present

## 2020-05-30 DIAGNOSIS — Z8616 Personal history of COVID-19: Secondary | ICD-10-CM | POA: Diagnosis not present

## 2020-05-30 DIAGNOSIS — M6281 Muscle weakness (generalized): Secondary | ICD-10-CM | POA: Diagnosis not present

## 2020-05-30 DIAGNOSIS — M791 Myalgia, unspecified site: Secondary | ICD-10-CM | POA: Diagnosis not present

## 2020-05-30 DIAGNOSIS — E1159 Type 2 diabetes mellitus with other circulatory complications: Secondary | ICD-10-CM | POA: Diagnosis not present

## 2020-05-30 DIAGNOSIS — E119 Type 2 diabetes mellitus without complications: Secondary | ICD-10-CM | POA: Diagnosis not present

## 2020-05-30 DIAGNOSIS — G8194 Hemiplegia, unspecified affecting left nondominant side: Secondary | ICD-10-CM | POA: Diagnosis not present

## 2020-05-30 DIAGNOSIS — R2681 Unsteadiness on feet: Secondary | ICD-10-CM | POA: Diagnosis not present

## 2020-05-30 DIAGNOSIS — I462 Cardiac arrest due to underlying cardiac condition: Secondary | ICD-10-CM | POA: Diagnosis not present

## 2020-05-30 DIAGNOSIS — I4891 Unspecified atrial fibrillation: Secondary | ICD-10-CM | POA: Diagnosis not present

## 2020-05-30 DIAGNOSIS — I639 Cerebral infarction, unspecified: Secondary | ICD-10-CM | POA: Diagnosis not present

## 2020-06-02 ENCOUNTER — Ambulatory Visit: Payer: Medicare Other | Admitting: Endocrinology

## 2020-06-02 DIAGNOSIS — E119 Type 2 diabetes mellitus without complications: Secondary | ICD-10-CM | POA: Diagnosis not present

## 2020-06-02 DIAGNOSIS — I10 Essential (primary) hypertension: Secondary | ICD-10-CM | POA: Diagnosis not present

## 2020-06-02 DIAGNOSIS — M791 Myalgia, unspecified site: Secondary | ICD-10-CM | POA: Diagnosis not present

## 2020-06-02 DIAGNOSIS — I462 Cardiac arrest due to underlying cardiac condition: Secondary | ICD-10-CM | POA: Diagnosis not present

## 2020-06-02 DIAGNOSIS — I639 Cerebral infarction, unspecified: Secondary | ICD-10-CM | POA: Diagnosis not present

## 2020-06-02 DIAGNOSIS — R2681 Unsteadiness on feet: Secondary | ICD-10-CM | POA: Diagnosis not present

## 2020-06-02 DIAGNOSIS — Z8616 Personal history of COVID-19: Secondary | ICD-10-CM | POA: Diagnosis not present

## 2020-06-02 DIAGNOSIS — M6281 Muscle weakness (generalized): Secondary | ICD-10-CM | POA: Diagnosis not present

## 2020-06-02 DIAGNOSIS — I4891 Unspecified atrial fibrillation: Secondary | ICD-10-CM | POA: Diagnosis not present

## 2020-06-02 DIAGNOSIS — G8194 Hemiplegia, unspecified affecting left nondominant side: Secondary | ICD-10-CM | POA: Diagnosis not present

## 2020-06-04 DIAGNOSIS — E871 Hypo-osmolality and hyponatremia: Secondary | ICD-10-CM | POA: Diagnosis not present

## 2020-06-04 DIAGNOSIS — R634 Abnormal weight loss: Secondary | ICD-10-CM | POA: Diagnosis not present

## 2020-06-04 DIAGNOSIS — Z8719 Personal history of other diseases of the digestive system: Secondary | ICD-10-CM | POA: Diagnosis not present

## 2020-06-04 DIAGNOSIS — I152 Hypertension secondary to endocrine disorders: Secondary | ICD-10-CM | POA: Diagnosis not present

## 2020-06-05 DIAGNOSIS — I10 Essential (primary) hypertension: Secondary | ICD-10-CM | POA: Diagnosis not present

## 2020-06-05 DIAGNOSIS — M6281 Muscle weakness (generalized): Secondary | ICD-10-CM | POA: Diagnosis not present

## 2020-06-05 DIAGNOSIS — K5909 Other constipation: Secondary | ICD-10-CM | POA: Diagnosis not present

## 2020-06-05 DIAGNOSIS — I152 Hypertension secondary to endocrine disorders: Secondary | ICD-10-CM | POA: Diagnosis not present

## 2020-06-05 DIAGNOSIS — E119 Type 2 diabetes mellitus without complications: Secondary | ICD-10-CM | POA: Diagnosis not present

## 2020-06-05 DIAGNOSIS — I639 Cerebral infarction, unspecified: Secondary | ICD-10-CM | POA: Diagnosis not present

## 2020-06-05 DIAGNOSIS — M7918 Myalgia, other site: Secondary | ICD-10-CM | POA: Diagnosis not present

## 2020-06-05 DIAGNOSIS — R109 Unspecified abdominal pain: Secondary | ICD-10-CM | POA: Diagnosis not present

## 2020-06-05 DIAGNOSIS — Z8616 Personal history of COVID-19: Secondary | ICD-10-CM | POA: Diagnosis not present

## 2020-06-05 DIAGNOSIS — G8194 Hemiplegia, unspecified affecting left nondominant side: Secondary | ICD-10-CM | POA: Diagnosis not present

## 2020-06-05 DIAGNOSIS — I462 Cardiac arrest due to underlying cardiac condition: Secondary | ICD-10-CM | POA: Diagnosis not present

## 2020-06-05 DIAGNOSIS — I4891 Unspecified atrial fibrillation: Secondary | ICD-10-CM | POA: Diagnosis not present

## 2020-06-05 DIAGNOSIS — M791 Myalgia, unspecified site: Secondary | ICD-10-CM | POA: Diagnosis not present

## 2020-06-05 DIAGNOSIS — R2681 Unsteadiness on feet: Secondary | ICD-10-CM | POA: Diagnosis not present

## 2020-06-06 ENCOUNTER — Emergency Department (HOSPITAL_COMMUNITY): Payer: Medicare Other

## 2020-06-06 ENCOUNTER — Inpatient Hospital Stay (HOSPITAL_COMMUNITY)
Admission: EM | Admit: 2020-06-06 | Discharge: 2020-06-21 | DRG: 186 | Disposition: A | Discharge status: Skilled Nursing Facility | Payer: Medicare Other | Attending: Family Medicine | Admitting: Family Medicine

## 2020-06-06 ENCOUNTER — Encounter (HOSPITAL_COMMUNITY): Payer: Self-pay | Admitting: Emergency Medicine

## 2020-06-06 ENCOUNTER — Other Ambulatory Visit: Payer: Self-pay | Admitting: Pharmacist

## 2020-06-06 ENCOUNTER — Other Ambulatory Visit: Payer: Self-pay

## 2020-06-06 DIAGNOSIS — Z833 Family history of diabetes mellitus: Secondary | ICD-10-CM | POA: Diagnosis not present

## 2020-06-06 DIAGNOSIS — I10 Essential (primary) hypertension: Secondary | ICD-10-CM | POA: Diagnosis present

## 2020-06-06 DIAGNOSIS — I4892 Unspecified atrial flutter: Secondary | ICD-10-CM | POA: Diagnosis present

## 2020-06-06 DIAGNOSIS — I313 Pericardial effusion (noninflammatory): Secondary | ICD-10-CM | POA: Diagnosis not present

## 2020-06-06 DIAGNOSIS — M8580 Other specified disorders of bone density and structure, unspecified site: Secondary | ICD-10-CM | POA: Diagnosis not present

## 2020-06-06 DIAGNOSIS — R0602 Shortness of breath: Secondary | ICD-10-CM | POA: Diagnosis not present

## 2020-06-06 DIAGNOSIS — Z9981 Dependence on supplemental oxygen: Secondary | ICD-10-CM

## 2020-06-06 DIAGNOSIS — I110 Hypertensive heart disease with heart failure: Secondary | ICD-10-CM | POA: Diagnosis not present

## 2020-06-06 DIAGNOSIS — E11649 Type 2 diabetes mellitus with hypoglycemia without coma: Secondary | ICD-10-CM | POA: Diagnosis not present

## 2020-06-06 DIAGNOSIS — R042 Hemoptysis: Secondary | ICD-10-CM | POA: Diagnosis not present

## 2020-06-06 DIAGNOSIS — Z7901 Long term (current) use of anticoagulants: Secondary | ICD-10-CM

## 2020-06-06 DIAGNOSIS — E785 Hyperlipidemia, unspecified: Secondary | ICD-10-CM | POA: Diagnosis present

## 2020-06-06 DIAGNOSIS — I4819 Other persistent atrial fibrillation: Secondary | ICD-10-CM | POA: Diagnosis present

## 2020-06-06 DIAGNOSIS — R1084 Generalized abdominal pain: Secondary | ICD-10-CM | POA: Diagnosis not present

## 2020-06-06 DIAGNOSIS — M9689 Other intraoperative and postprocedural complications and disorders of the musculoskeletal system: Secondary | ICD-10-CM | POA: Diagnosis present

## 2020-06-06 DIAGNOSIS — F319 Bipolar disorder, unspecified: Secondary | ICD-10-CM | POA: Diagnosis present

## 2020-06-06 DIAGNOSIS — S272XXA Traumatic hemopneumothorax, initial encounter: Secondary | ICD-10-CM | POA: Diagnosis not present

## 2020-06-06 DIAGNOSIS — I700 Atherosclerosis of aorta: Secondary | ICD-10-CM | POA: Diagnosis not present

## 2020-06-06 DIAGNOSIS — R1011 Right upper quadrant pain: Secondary | ICD-10-CM

## 2020-06-06 DIAGNOSIS — I5022 Chronic systolic (congestive) heart failure: Secondary | ICD-10-CM | POA: Diagnosis present

## 2020-06-06 DIAGNOSIS — Z743 Need for continuous supervision: Secondary | ICD-10-CM | POA: Diagnosis not present

## 2020-06-06 DIAGNOSIS — Z794 Long term (current) use of insulin: Secondary | ICD-10-CM

## 2020-06-06 DIAGNOSIS — Z8674 Personal history of sudden cardiac arrest: Secondary | ICD-10-CM

## 2020-06-06 DIAGNOSIS — Z8616 Personal history of COVID-19: Secondary | ICD-10-CM | POA: Diagnosis not present

## 2020-06-06 DIAGNOSIS — Z79899 Other long term (current) drug therapy: Secondary | ICD-10-CM

## 2020-06-06 DIAGNOSIS — J90 Pleural effusion, not elsewhere classified: Secondary | ICD-10-CM | POA: Diagnosis not present

## 2020-06-06 DIAGNOSIS — J9811 Atelectasis: Secondary | ICD-10-CM | POA: Diagnosis not present

## 2020-06-06 DIAGNOSIS — I639 Cerebral infarction, unspecified: Secondary | ICD-10-CM | POA: Diagnosis not present

## 2020-06-06 DIAGNOSIS — R079 Chest pain, unspecified: Secondary | ICD-10-CM

## 2020-06-06 DIAGNOSIS — Z820 Family history of epilepsy and other diseases of the nervous system: Secondary | ICD-10-CM

## 2020-06-06 DIAGNOSIS — Z20822 Contact with and (suspected) exposure to covid-19: Secondary | ICD-10-CM | POA: Diagnosis present

## 2020-06-06 DIAGNOSIS — J9601 Acute respiratory failure with hypoxia: Secondary | ICD-10-CM | POA: Diagnosis not present

## 2020-06-06 DIAGNOSIS — I428 Other cardiomyopathies: Secondary | ICD-10-CM | POA: Diagnosis present

## 2020-06-06 DIAGNOSIS — I2510 Atherosclerotic heart disease of native coronary artery without angina pectoris: Secondary | ICD-10-CM | POA: Diagnosis not present

## 2020-06-06 DIAGNOSIS — R112 Nausea with vomiting, unspecified: Secondary | ICD-10-CM | POA: Diagnosis not present

## 2020-06-06 DIAGNOSIS — I420 Dilated cardiomyopathy: Secondary | ICD-10-CM | POA: Diagnosis present

## 2020-06-06 DIAGNOSIS — K5900 Constipation, unspecified: Secondary | ICD-10-CM | POA: Diagnosis present

## 2020-06-06 DIAGNOSIS — R1031 Right lower quadrant pain: Secondary | ICD-10-CM | POA: Diagnosis not present

## 2020-06-06 DIAGNOSIS — I4891 Unspecified atrial fibrillation: Secondary | ICD-10-CM | POA: Diagnosis not present

## 2020-06-06 DIAGNOSIS — E1165 Type 2 diabetes mellitus with hyperglycemia: Secondary | ICD-10-CM | POA: Diagnosis not present

## 2020-06-06 DIAGNOSIS — Z4682 Encounter for fitting and adjustment of non-vascular catheter: Secondary | ICD-10-CM

## 2020-06-06 DIAGNOSIS — Z9049 Acquired absence of other specified parts of digestive tract: Secondary | ICD-10-CM | POA: Diagnosis not present

## 2020-06-06 DIAGNOSIS — E739 Lactose intolerance, unspecified: Secondary | ICD-10-CM | POA: Diagnosis present

## 2020-06-06 DIAGNOSIS — T462X5A Adverse effect of other antidysrhythmic drugs, initial encounter: Secondary | ICD-10-CM | POA: Diagnosis present

## 2020-06-06 DIAGNOSIS — Z886 Allergy status to analgesic agent status: Secondary | ICD-10-CM

## 2020-06-06 DIAGNOSIS — Z888 Allergy status to other drugs, medicaments and biological substances status: Secondary | ICD-10-CM

## 2020-06-06 DIAGNOSIS — Z823 Family history of stroke: Secondary | ICD-10-CM

## 2020-06-06 DIAGNOSIS — Z91018 Allergy to other foods: Secondary | ICD-10-CM

## 2020-06-06 DIAGNOSIS — K219 Gastro-esophageal reflux disease without esophagitis: Secondary | ICD-10-CM | POA: Diagnosis present

## 2020-06-06 DIAGNOSIS — Z8249 Family history of ischemic heart disease and other diseases of the circulatory system: Secondary | ICD-10-CM

## 2020-06-06 DIAGNOSIS — E782 Mixed hyperlipidemia: Secondary | ICD-10-CM | POA: Diagnosis present

## 2020-06-06 DIAGNOSIS — R9431 Abnormal electrocardiogram [ECG] [EKG]: Secondary | ICD-10-CM | POA: Diagnosis not present

## 2020-06-06 DIAGNOSIS — E1169 Type 2 diabetes mellitus with other specified complication: Secondary | ICD-10-CM | POA: Diagnosis present

## 2020-06-06 DIAGNOSIS — E875 Hyperkalemia: Secondary | ICD-10-CM | POA: Diagnosis present

## 2020-06-06 DIAGNOSIS — I480 Paroxysmal atrial fibrillation: Secondary | ICD-10-CM | POA: Diagnosis present

## 2020-06-06 DIAGNOSIS — R1111 Vomiting without nausea: Secondary | ICD-10-CM | POA: Diagnosis not present

## 2020-06-06 DIAGNOSIS — Z91010 Allergy to peanuts: Secondary | ICD-10-CM

## 2020-06-06 DIAGNOSIS — R109 Unspecified abdominal pain: Secondary | ICD-10-CM | POA: Diagnosis not present

## 2020-06-06 DIAGNOSIS — Z8719 Personal history of other diseases of the digestive system: Secondary | ICD-10-CM | POA: Diagnosis not present

## 2020-06-06 DIAGNOSIS — R110 Nausea: Secondary | ICD-10-CM | POA: Diagnosis not present

## 2020-06-06 DIAGNOSIS — R52 Pain, unspecified: Secondary | ICD-10-CM

## 2020-06-06 DIAGNOSIS — Y848 Other medical procedures as the cause of abnormal reaction of the patient, or of later complication, without mention of misadventure at the time of the procedure: Secondary | ICD-10-CM | POA: Diagnosis present

## 2020-06-06 LAB — COMPREHENSIVE METABOLIC PANEL
ALT: 34 U/L (ref 0–44)
AST: 66 U/L — ABNORMAL HIGH (ref 15–41)
Albumin: 2.2 g/dL — ABNORMAL LOW (ref 3.5–5.0)
Alkaline Phosphatase: 168 U/L — ABNORMAL HIGH (ref 38–126)
Anion gap: 3 — ABNORMAL LOW (ref 5–15)
BUN: 28 mg/dL — ABNORMAL HIGH (ref 8–23)
CO2: 37 mmol/L — ABNORMAL HIGH (ref 22–32)
Calcium: 8.9 mg/dL (ref 8.9–10.3)
Chloride: 98 mmol/L (ref 98–111)
Creatinine, Ser: 0.8 mg/dL (ref 0.44–1.00)
GFR, Estimated: >60 mL/min (ref >60)
Glucose, Bld: 197 mg/dL — ABNORMAL HIGH (ref 70–99)
Potassium: 4.7 mmol/L (ref 3.5–5.1)
Sodium: 138 mmol/L (ref 135–145)
Total Bilirubin: 0.5 mg/dL (ref 0.3–1.2)
Total Protein: 7.5 g/dL (ref 6.5–8.1)

## 2020-06-06 LAB — CBC WITH DIFFERENTIAL/PLATELET
Abs Immature Granulocytes: 0.05 10*3/uL (ref 0.00–0.07)
Basophils Absolute: 0 10*3/uL (ref 0.0–0.1)
Basophils Relative: 0 %
Eosinophils Absolute: 0.1 10*3/uL (ref 0.0–0.5)
Eosinophils Relative: 1 %
HCT: 30.5 % — ABNORMAL LOW (ref 36.0–46.0)
Hemoglobin: 9.3 g/dL — ABNORMAL LOW (ref 12.0–15.0)
Immature Granulocytes: 0 %
Lymphocytes Relative: 7 %
Lymphs Abs: 1 10*3/uL (ref 0.7–4.0)
MCH: 25.1 pg — ABNORMAL LOW (ref 26.0–34.0)
MCHC: 30.5 g/dL (ref 30.0–36.0)
MCV: 82.2 fL (ref 80.0–100.0)
Monocytes Absolute: 1.1 10*3/uL — ABNORMAL HIGH (ref 0.1–1.0)
Monocytes Relative: 9 %
Neutro Abs: 10.7 10*3/uL — ABNORMAL HIGH (ref 1.7–7.7)
Neutrophils Relative %: 83 %
Platelets: 493 10*3/uL — ABNORMAL HIGH (ref 150–400)
RBC: 3.71 MIL/uL — ABNORMAL LOW (ref 3.87–5.11)
RDW: 18.6 % — ABNORMAL HIGH (ref 11.5–15.5)
WBC: 12.9 10*3/uL — ABNORMAL HIGH (ref 4.0–10.5)
nRBC: 0 % (ref 0.0–0.2)

## 2020-06-06 LAB — LIPASE, BLOOD: Lipase: 35 U/L (ref 11–51)

## 2020-06-06 MED ORDER — METOCLOPRAMIDE HCL 5 MG/ML IJ SOLN
5.0000 mg | Freq: Once | INTRAMUSCULAR | Status: AC
  Administered 2020-06-06: 5 mg via INTRAVENOUS
  Filled 2020-06-06: qty 2

## 2020-06-06 MED ORDER — FENTANYL CITRATE (PF) 100 MCG/2ML IJ SOLN
50.0000 ug | Freq: Once | INTRAMUSCULAR | Status: AC
Start: 2020-06-06 — End: 2020-06-06
  Administered 2020-06-06: 50 ug via INTRAVENOUS
  Filled 2020-06-06: qty 2

## 2020-06-06 MED ORDER — PROMETHAZINE HCL 25 MG/ML IJ SOLN: 12.5000 mg | Freq: Once | INTRAMUSCULAR | Status: DC

## 2020-06-06 NOTE — ED Provider Notes (Signed)
[facility] DEPT Provider Note   CSN: [id number] Arrival date & time: 06/06/20  2029     History Chief Complaint  Patient presents with  . Nausea    [name] [name] is a 70 y.o. female past medical history of CHF, diabetes, paroxysmal A. Fib on Eliquis, chronic digoxin, recent hospital admission with/for CVA treated with tPA and mechanical thrombectomy, post cardiac arrest this month.  She is presenting from rehab facility with complaint of right sided abdominal pain.  She states the abdominal pain has been ongoing throughout the month, states it began "ever since my stroke."  However, she has had nausea over the last 3 Days and has been unable to keep down food.  Endorses ongoing constipation.  Last BM was yesterday.  Denies urinary symptoms, diarrhea, fevers.   She presents on 3L [place], states she has been on this O2 since hospital discharge. Denies CP. Endorses SOB with talking.  The history is provided by the patient and medical records.       Past Medical History:  Diagnosis Date  . Abdominal pain   . Atrial flutter ([place])   . Back pain   . Chronic combined systolic and diastolic CHF (congestive heart failure) ([place]) 02/04/2020  . Constipation   . Diabetes mellitus without complication ([place])   . Fatigue   . Full dentures   . Hemorrhoids   . History of noncompliance with medical treatment, presenting hazards to health   . Hypertension   . Lack of adequate sleep   . Liver mass   . PAF (paroxysmal atrial fibrillation) ([place])   . Rash    on right breast  . Wears glasses     Patient Active Problem List   Diagnosis Date Noted  . Stroke (cerebrum) ([place]) 05/12/2020  . Stroke ([place]) 05/12/2020  . Acute respiratory failure with hypoxia ([place])   . Bilateral leg edema 03/18/2020  . HFrEF (heart failure with reduced ejection fraction) ([place]) 03/18/2020  . Nonrheumatic tricuspid valve regurgitation 03/18/2020  . Gastroesophageal reflux disease  03/12/2020  . Wears glasses   . Liver mass   . Lack of adequate sleep   . Hypertension   . Full dentures   . Fatigue   . Diabetes mellitus without complication ([place])   . SOB (shortness of breath)   . A-fib ([place]) 02/06/2020  . COVID-19 02/04/2020  . Chronic anticoagulation - on Eliquis for chronic afib 02/04/2020  . Chronic combined systolic and diastolic CHF (congestive heart failure) ([place]) 02/04/2020  . Oral thrush 02/04/2020  . Acute cystitis 02/04/2020  . Acute combined systolic and diastolic heart failure ([place]) 02/03/2020  . Morbid obesity ([place]) 02/03/2020  . Atrial fibrillation with RVR ([place]) 02/03/2020  . Dysphagia 02/03/2020  . Epigastric pain 02/03/2020  . Atrial tachycardia ([place]) 07/29/2019  . History of esophageal stricture 07/29/2019  . Atrial fibrillation with rapid ventricular response ([place]) 07/26/2019  . Rash 12/30/2017  . Healthcare maintenance 09/07/2017  . Osteopenia after menopause 06/09/2017  . Uncontrolled type 2 diabetes mellitus with hyperglycemia ([place]) 06/08/2017  . Back pain 11/15/2014  . Pericardial effusion 05/12/2010  . HTN (hypertension) 05/12/2010  . Mixed hyperlipidemia 05/12/2010    Past Surgical History:  Procedure Laterality Date  . BREAST DUCTAL SYSTEM EXCISION    . BREAST EXCISIONAL BIOPSY Left   . CARDIOVERSION N/A 07/31/2019   Procedure: CARDIOVERSION;  Surgeon: Pixie Casino, MD;  Location: [facility] ENDOSCOPY;  Service: Cardiovascular;  Laterality: N/A;  . CARDIOVERSION N/A 05/19/2020  Procedure: CARDIOVERSION;  Surgeon: Jolaine Artist, MD;  Location: [facility] ENDOSCOPY;  Service: Cardiovascular;  Laterality: N/A;  . CHOLECYSTECTOMY    . CHOLECYSTECTOMY, LAPAROSCOPIC  07/24/2010  . ESOPHAGEAL DILATION    . IR CT HEAD LTD  05/12/2020  . IR PERCUTANEOUS ART THROMBECTOMY/INFUSION INTRACRANIAL INC DIAG ANGIO  05/12/2020      . IR PERCUTANEOUS ART THROMBECTOMY/INFUSION INTRACRANIAL INC DIAG ANGIO  05/12/2020  . IR US GUIDE VASC ACCESS RIGHT   05/12/2020  . RADIOLOGY WITH ANESTHESIA N/A 05/12/2020   Procedure: IR WITH ANESTHESIA;  Surgeon: Radiologist, Medication, MD;  Location: [facility];  Service: Radiology;  Laterality: N/A;  . TEE WITHOUT CARDIOVERSION N/A 07/31/2019   Procedure: TRANSESOPHAGEAL ECHOCARDIOGRAM (TEE);  Surgeon: Pixie Casino, MD;  Location: [facility];  Service: Cardiovascular;  Laterality: N/A;  . TEE WITHOUT CARDIOVERSION N/A 05/19/2020   Procedure: TRANSESOPHAGEAL ECHOCARDIOGRAM (TEE);  Surgeon: Jolaine Artist, MD;  Location[facility] ENDOSCOPY;  Service: Cardiovascular;  Laterality: N/A;     OB History   No obstetric history on file.     Family History  Problem Relation Age of Onset  . Stroke Mother   . Other Father        broken heart after spouse spouse  . Diabetes Sister   . Hypertension Sister   . Diabetes Sister   . Multiple sclerosis Sister   . Alcohol abuse Other   . Diabetes Other   . Breast cancer Neg Hx     Social History   Tobacco Use  . Smoking status: Never Smoker  . Smokeless tobacco: Never Used  Vaping Use  . Vaping Use: Never used  Substance Use Topics  . Alcohol use: No    Alcohol/week: 0.0 standard drinks  . Drug use: No    Home Medications Prior to Admission medications   Medication Sig Start Date End Date Taking? Authorizing Provider  acetaminophen (TYLENOL) 650 MG CR tablet Take 650 mg by mouth daily as needed for pain.    [provider]  amiodarone (PACERONE) 200 MG tablet Take 1 tablet (200 mg total) by mouth 2 (two) times daily. 05/26/20   Bonnielee Haff, MD  atorvastatin (LIPITOR) 80 MG tablet Take 1 tablet (80 mg total) by mouth daily. 05/27/20   Bonnielee Haff, MD  B-D UF III MINI PEN NEEDLES 31G X 5 MM MISC USE WITH LANTUS PEN DAILY AS DIRECTED 02/11/20   Carollee Leitz, MD  bisacodyl (DULCOLAX) 10 MG suppository Place 1 suppository (10 mg total) rectally daily as needed for moderate constipation. 05/26/20   Bonnielee Haff, MD  Blood Pressure KIT Monitor  blood pressure twice a day 09/28/19   Carollee Leitz, MD  digoxin (LANOXIN) 0.125 MG tablet Take 1 tablet (0.125 mg total) by mouth daily. 05/27/20   Bonnielee Haff, MD  ELIQUIS 5 MG TABS tablet TAKE 1 TABLET(5 MG) BY MOUTH TWICE DAILY Patient taking differently: Take 5 mg by mouth 2 (two) times daily. 04/10/20   Tobb, Kardie, DO  feeding supplement (ENSURE ENLIVE / ENSURE PLUS) LIQD Take 237 mLs by mouth 2 (two) times daily between meals. 05/26/20   Bonnielee Haff, MD  fexofenadine (ALLEGRA) 180 MG tablet Take 180 mg by mouth daily.    [provider]  furosemide (LASIX) 40 MG tablet Take 37m daily as needed whenever weight is greater than 190lbs. 05/26/20   KBonnielee Haff MD  glucose blood (ACCU-CHEK AVIVA) test strip Use as instructed 06/16/17   HZenia Resides MD  hydrALAZINE (APRESOLINE) 10  MG tablet Take 1 tablet (10 mg total) by mouth every 8 (eight) hours. 05/26/20   Bonnielee Haff, MD  insulin glargine (LANTUS) 100 UNIT/ML Solostar Pen Inject 18 Units into the skin daily. Patient taking differently: Inject 20 Units into the skin See admin instructions. Inject 20 units into skin at 3 PM daily 02/07/20   Doristine Mango L, DO  isosorbide mononitrate (IMDUR) 30 MG 24 hr tablet Take 0.5 tablets (15 mg total) by mouth daily. 05/26/20   Bonnielee Haff, MD  Lancets [facility]) lancets Once daily testing. 06/16/17   Zenia Resides, MD  Magnesium Oxide 400 MG CAPS Take 1 capsule (400 mg total) by mouth daily. 08/17/19   Richardson Dopp T, PA-C  Multiple Vitamin (MULTIVITAMIN WITH MINERALS) TABS tablet Take 1 tablet by mouth daily. 05/27/20   Bonnielee Haff, MD  Nutritional Supplements (,FEEDING SUPPLEMENT, PROSOURCE PLUS) liquid Take 30 mLs by mouth 2 (two) times daily between meals. 05/26/20   Bonnielee Haff, MD  oxyCODONE (OXY IR/ROXICODONE) 5 MG immediate release tablet Take 1 tablet (5 mg total) by mouth every 6 (six) hours as needed for severe pain. 05/26/20   Bonnielee Haff, MD   pantoprazole (PROTONIX) 40 MG tablet Take 1 tablet (40 mg total) by mouth daily. 05/27/20   Bonnielee Haff, MD  polyethylene glycol (MIRALAX / GLYCOLAX) 17 g packet Take 17 g by mouth 2 (two) times daily. 05/26/20   Bonnielee Haff, MD  senna-docusate (SENOKOT-S) 8.6-50 MG tablet Take 2 tablets by mouth 2 (two) times daily. 05/26/20   Bonnielee Haff, MD    Allergies    Amiodarone, Fruit & vegetable daily [nutritional supplements], Metoprolol, Other, Peanut-containing drug products, Aspirin, Tylenol [acetaminophen], Beef-derived products, and Lactose intolerance (gi)  Review of Systems   Review of Systems  All other systems reviewed and are negative.   Physical Exam Updated Vital Signs BP (!) 151/67 (BP Location: Left Arm)   Pulse 74   Temp 98.4 F (36.9 C) (Oral)   Resp 20   SpO2 100%   Physical Exam Vitals and nursing note reviewed.  Constitutional:      General: She is not in acute distress.    Appearance: She is well-developed.  HENT:     Head: Normocephalic and atraumatic.  Eyes:     Conjunctiva/sclera: Conjunctivae normal.  Cardiovascular:     Rate and Rhythm: Normal rate.     Pulses: Normal pulses.  Pulmonary:     Effort: Pulmonary effort is normal. No respiratory distress.     Comments: Diminished on the left Abdominal:     General: Bowel sounds are normal.     Palpations: Abdomen is soft.     Tenderness: There is generalized abdominal tenderness. There is no guarding or rebound.  Musculoskeletal:     Right lower leg: No edema.     Left lower leg: No edema.  Skin:    General: Skin is warm.  Neurological:     Mental Status: She is alert.  Psychiatric:        Behavior: Behavior normal.     ED Results / Procedures / Treatments   Labs (all labs ordered are listed, but only abnormal results are displayed) Labs Reviewed  COMPREHENSIVE METABOLIC PANEL - Abnormal; Notable for the following components:      Result Value   CO2 37 (*)    Glucose, Bld 197 (*)     BUN 28 (*)    Albumin 2.2 (*)    AST 66 (*)  Alkaline Phosphatase 168 (*)    Anion gap 3 (*)    All other components within normal limits  CBC WITH DIFFERENTIAL/PLATELET - Abnormal; Notable for the following components:   WBC 12.9 (*)    RBC 3.71 (*)    Hemoglobin 9.3 (*)    HCT 30.5 (*)    MCH 25.1 (*)    RDW 18.6 (*)    Platelets 493 (*)    Neutro Abs 10.7 (*)    Monocytes Absolute 1.1 (*)    All other components within normal limits  LIPASE, BLOOD  URINALYSIS, ROUTINE W REFLEX MICROSCOPIC    EKG None  Radiology CT Abdomen Pelvis Wo Contrast  Result Date: 06/06/2020 CLINICAL DATA:  Right lower quadrant abdominal pain, nausea EXAM: CT ABDOMEN AND PELVIS WITHOUT CONTRAST TECHNIQUE: Multidetector CT imaging of the abdomen and pelvis was performed following the standard protocol without IV contrast. COMPARISON:  05/13/2020, 05/12/2020, 07/20/2015 FINDINGS: Lower chest: There is a large multiloculated heterogeneous left pleural effusion, with areas of higher attenuation seen anteriorly and posteriorly. There is complete atelectasis of the visualized left lung. Right lung is clear. The heart is unremarkable. Unenhanced CT was performed per clinician order. Lack of IV contrast limits sensitivity and specificity, especially for evaluation of abdominal/pelvic solid viscera. Hepatobiliary: No focal liver abnormality is seen. Status post cholecystectomy. No biliary dilatation. Pancreas: Unremarkable. No pancreatic ductal dilatation or surrounding inflammatory changes. Spleen: Indeterminate 1.3 cm hypodensity is seen within the posterior aspect of the spleen, image 37/2. Spleen is normal in size. Adrenals/Urinary Tract: No urinary tract calculi or obstructive uropathy within either kidney. The adrenals and bladder are unremarkable. Stomach/Bowel: No bowel obstruction or ileus. Normal appendix right lower quadrant. No bowel wall thickening or inflammatory change. Vascular/Lymphatic: Aortic  atherosclerosis. No enlarged abdominal or pelvic lymph nodes. Reproductive: Status post hysterectomy. No adnexal masses. Other: No free fluid or free gas.  No abdominal wall hernia. Musculoskeletal: There are subacute healing right anterolateral fourth and fifth rib fractures with minimal callus formation. No acute bony abnormalities. Reconstructed images demonstrate no additional findings. IMPRESSION: 1. Large complex loculated left pleural effusion, with complete atelectasis of the visualized left lower lobe. Differential diagnosis would include empyema or sequela of hemothorax. Dedicated CT of the chest may be useful to visualize this abnormality in its entirety. 2. No acute intra-abdominal or intrapelvic process. Normal appendix right lower quadrant. 3. Indeterminate hypodensity posterior aspect of the spleen, which could reflect a small cyst or hemangioma. 4.  Aortic Atherosclerosis (ICD10-I70.0). Electronically Signed   By: Randa Ngo M.D.   On: 06/06/2020 23:02   DG Chest 2 View  Result Date: 06/06/2020 CLINICAL DATA:  Large left effusion on recent CT of the abdomen EXAM: CHEST - 2 VIEW COMPARISON:  05/13/2020 FINDINGS: Delete opacification of the left hemithorax is noted. Cardiac shadow is stable. Right lung is clear. No acute bony abnormality is noted. IMPRESSION: Opacification of the left hemithorax consistent with the complex diffusion seen on recent CT of the abdomen. Electronically Signed   By: Inez Catalina M.D.   On: 06/06/2020 23:22    Procedures Procedures   Medications Ordered in ED Medications  fentaNYL (SUBLIMAZE) injection 50 mcg (50 mcg Intravenous Given 06/06/20 2226)  metoCLOPramide (REGLAN) injection 5 mg (5 mg Intravenous Given 06/06/20 2228)    ED Course  I have reviewed the triage vital signs and the nursing notes.  Pertinent labs & imaging results that were available during my care of the patient were reviewed by me  and considered in my medical decision making (see  chart for details).    MDM Rules/Calculators/A&P                          Patient presenting for evaluation of nausea vomitng and right sided abd pain.  States her whole right side has been painful since she had her stroke earlier this month.  Admission for stroke was complicated by cardiac arrest status post PEA and mechanical thrombectomy.  She was intubated briefly for about 24 hours and then extubated.  She was discharged to SNF at [facility] on 05/26/2020.  She states she was discharged on oxygen.  States she has had right-sided abdominal pain ever since she had her stroke.  However she began having nausea vomiting over the last 3 days.  No chest pain.  Does have some shortness of breath when she speaks.  Is on 3 L nasal cannula here and satting normally on room air.  Abdomen is diffusely tender without guarding or rebound.  He is hypertensive though afebrile with normal heart rate.  Blood work and CT imaging ordered.  Reglan for nausea, fentanyl for pain.  CBC with leukocytosis, though appears to be downtrending from previous.  Hemoglobin is 9.3, also appears to be improved from recent.  Have a mild thrombocytosis.  The elevated AST and alk phos.  Renal function is improving from previous.  Lipase within normal limits.  CT scan independently reviewed by myself shows abnormality in the left lung field with significant opacification.  cxr is ordered.  CT scan of the abdomen shows no acute intra-abdominal process though does show:  Large complex loculated left pleural effusion, with complete  atelectasis of the visualized left lower lobe. Differential  diagnosis would include empyema or sequela of hemothorax   CT scan ordered for further evaluation.  On reevaluation she reports symptom improvement with medications.   Care handed off at shift change to [place], PA-C, to follow CT results and disposition. Final Clinical Impression(s) / ED Diagnoses Final diagnoses:  None    Rx / DC  Orders ED Discharge Orders    None       Akaisha Truman, [place] N, PA-C 06/07/20 0002    Blanchie Dessert, MD 06/09/20 [phone]

## 2020-06-06 NOTE — ED Triage Notes (Addendum)
Pt BIB EMS from [facility]. She coded 2 weeks ago and they got her back after CPR. She has been nauseous for 4 days intermittently. Also reports decreased PO intake d/t pain with eating and diarrhea. Reports pain in her mid abdomen through her back.

## 2020-06-06 NOTE — ED Provider Notes (Signed)
Patient received in sign out at end of shift from [place] Robinson, [facility].  AP x 1 month, 3 days of N, [facility] resident for rehab post CVA, cardiac arrest No fever, diarrhea, h/o constipation, last BM yesterday On 3 L O2 at [facility], not on O2 prior to admission CT abd/pel negative abdomen - lung abnormal SOB/DOE, new since recent discharge CXR read: Large complex loculated left pleural effusion, with complete  atelectasis of the visualized left lower lobe. Differential  diagnosis would include empyema or sequela of hemothorax. Dedicated  CT of the chest may be useful to visualize this abnormality in its  entirety.   Lung findings are new since recent discharge.  Pending chest CT  CT read: Large complex multilocular left pleural effusion. This has  increased in size since recent CT of the neck dated 05/12/2020.  Differential includes empyema, sequela of previous hemothorax, or  neoplasm. Correlation with cytology after thoracentesis may be  useful.   1:10 - recheck and introduction to the patient She is currently on 3L O2 via [place], O2 saturation 100% She has some difficulty speaking full sentences secondary to SOB.  No pain  Discussed/reviewed CT with Dr. Nicanor Alcon and with pharmacy. Feel the patient would benefit from admission to have IR perform thoracentesis for relief of pleural effusion and to aid definitive diagnosis. DDx includes empyema. Will obtain blood cultures, start abx as per pharmacy recommendation. Hospitalist paged for admission.  Discussed with Dr. Loney Loh, who will see and admit.    Elpidio Anis, PA-C 06/07/20 0134    Gwyneth Sprout, MD 06/09/20 [phone]

## 2020-06-07 ENCOUNTER — Other Ambulatory Visit: Payer: Self-pay

## 2020-06-07 ENCOUNTER — Inpatient Hospital Stay (HOSPITAL_COMMUNITY): Payer: Medicare Other

## 2020-06-07 ENCOUNTER — Emergency Department (HOSPITAL_COMMUNITY): Payer: Medicare Other

## 2020-06-07 DIAGNOSIS — J90 Pleural effusion, not elsewhere classified: Secondary | ICD-10-CM | POA: Diagnosis not present

## 2020-06-07 DIAGNOSIS — K219 Gastro-esophageal reflux disease without esophagitis: Secondary | ICD-10-CM | POA: Diagnosis not present

## 2020-06-07 DIAGNOSIS — I420 Dilated cardiomyopathy: Secondary | ICD-10-CM | POA: Diagnosis not present

## 2020-06-07 DIAGNOSIS — J948 Other specified pleural conditions: Secondary | ICD-10-CM | POA: Diagnosis not present

## 2020-06-07 DIAGNOSIS — Z8616 Personal history of COVID-19: Secondary | ICD-10-CM | POA: Diagnosis not present

## 2020-06-07 DIAGNOSIS — F319 Bipolar disorder, unspecified: Secondary | ICD-10-CM | POA: Diagnosis present

## 2020-06-07 DIAGNOSIS — Z9049 Acquired absence of other specified parts of digestive tract: Secondary | ICD-10-CM | POA: Diagnosis not present

## 2020-06-07 DIAGNOSIS — I4819 Other persistent atrial fibrillation: Secondary | ICD-10-CM | POA: Diagnosis present

## 2020-06-07 DIAGNOSIS — E782 Mixed hyperlipidemia: Secondary | ICD-10-CM | POA: Diagnosis not present

## 2020-06-07 DIAGNOSIS — R1011 Right upper quadrant pain: Secondary | ICD-10-CM | POA: Diagnosis not present

## 2020-06-07 DIAGNOSIS — M9689 Other intraoperative and postprocedural complications and disorders of the musculoskeletal system: Secondary | ICD-10-CM | POA: Diagnosis present

## 2020-06-07 DIAGNOSIS — Z8249 Family history of ischemic heart disease and other diseases of the circulatory system: Secondary | ICD-10-CM | POA: Diagnosis not present

## 2020-06-07 DIAGNOSIS — I428 Other cardiomyopathies: Secondary | ICD-10-CM | POA: Diagnosis not present

## 2020-06-07 DIAGNOSIS — I480 Paroxysmal atrial fibrillation: Secondary | ICD-10-CM | POA: Diagnosis not present

## 2020-06-07 DIAGNOSIS — Z8674 Personal history of sudden cardiac arrest: Secondary | ICD-10-CM | POA: Diagnosis not present

## 2020-06-07 DIAGNOSIS — I469 Cardiac arrest, cause unspecified: Secondary | ICD-10-CM | POA: Diagnosis not present

## 2020-06-07 DIAGNOSIS — R109 Unspecified abdominal pain: Secondary | ICD-10-CM | POA: Diagnosis not present

## 2020-06-07 DIAGNOSIS — I69811 Memory deficit following other cerebrovascular disease: Secondary | ICD-10-CM | POA: Diagnosis not present

## 2020-06-07 DIAGNOSIS — M8580 Other specified disorders of bone density and structure, unspecified site: Secondary | ICD-10-CM | POA: Diagnosis present

## 2020-06-07 DIAGNOSIS — I4892 Unspecified atrial flutter: Secondary | ICD-10-CM | POA: Diagnosis present

## 2020-06-07 DIAGNOSIS — Z4682 Encounter for fitting and adjustment of non-vascular catheter: Secondary | ICD-10-CM | POA: Diagnosis not present

## 2020-06-07 DIAGNOSIS — R1010 Upper abdominal pain, unspecified: Secondary | ICD-10-CM | POA: Diagnosis not present

## 2020-06-07 DIAGNOSIS — Z833 Family history of diabetes mellitus: Secondary | ICD-10-CM | POA: Diagnosis not present

## 2020-06-07 DIAGNOSIS — S272XXA Traumatic hemopneumothorax, initial encounter: Secondary | ICD-10-CM | POA: Diagnosis not present

## 2020-06-07 DIAGNOSIS — I313 Pericardial effusion (noninflammatory): Secondary | ICD-10-CM | POA: Diagnosis not present

## 2020-06-07 DIAGNOSIS — R9431 Abnormal electrocardiogram [ECG] [EKG]: Secondary | ICD-10-CM | POA: Diagnosis not present

## 2020-06-07 DIAGNOSIS — M549 Dorsalgia, unspecified: Secondary | ICD-10-CM | POA: Diagnosis not present

## 2020-06-07 DIAGNOSIS — E1165 Type 2 diabetes mellitus with hyperglycemia: Secondary | ICD-10-CM

## 2020-06-07 DIAGNOSIS — I110 Hypertensive heart disease with heart failure: Secondary | ICD-10-CM | POA: Diagnosis not present

## 2020-06-07 DIAGNOSIS — R079 Chest pain, unspecified: Secondary | ICD-10-CM

## 2020-06-07 DIAGNOSIS — K5900 Constipation, unspecified: Secondary | ICD-10-CM | POA: Diagnosis present

## 2020-06-07 DIAGNOSIS — Z823 Family history of stroke: Secondary | ICD-10-CM | POA: Diagnosis not present

## 2020-06-07 DIAGNOSIS — I4891 Unspecified atrial fibrillation: Secondary | ICD-10-CM | POA: Diagnosis not present

## 2020-06-07 DIAGNOSIS — R1031 Right lower quadrant pain: Secondary | ICD-10-CM | POA: Diagnosis not present

## 2020-06-07 DIAGNOSIS — I5022 Chronic systolic (congestive) heart failure: Secondary | ICD-10-CM | POA: Diagnosis not present

## 2020-06-07 DIAGNOSIS — Z20822 Contact with and (suspected) exposure to covid-19: Secondary | ICD-10-CM | POA: Diagnosis not present

## 2020-06-07 DIAGNOSIS — R0602 Shortness of breath: Secondary | ICD-10-CM | POA: Diagnosis present

## 2020-06-07 DIAGNOSIS — M6281 Muscle weakness (generalized): Secondary | ICD-10-CM | POA: Diagnosis not present

## 2020-06-07 DIAGNOSIS — I69891 Dysphagia following other cerebrovascular disease: Secondary | ICD-10-CM | POA: Diagnosis not present

## 2020-06-07 DIAGNOSIS — Z743 Need for continuous supervision: Secondary | ICD-10-CM | POA: Diagnosis not present

## 2020-06-07 DIAGNOSIS — I10 Essential (primary) hypertension: Secondary | ICD-10-CM | POA: Diagnosis not present

## 2020-06-07 DIAGNOSIS — J918 Pleural effusion in other conditions classified elsewhere: Secondary | ICD-10-CM | POA: Diagnosis not present

## 2020-06-07 DIAGNOSIS — Z7901 Long term (current) use of anticoagulants: Secondary | ICD-10-CM | POA: Diagnosis not present

## 2020-06-07 DIAGNOSIS — S2222XD Fracture of body of sternum, subsequent encounter for fracture with routine healing: Secondary | ICD-10-CM | POA: Diagnosis not present

## 2020-06-07 DIAGNOSIS — S2243XD Multiple fractures of ribs, bilateral, subsequent encounter for fracture with routine healing: Secondary | ICD-10-CM | POA: Diagnosis not present

## 2020-06-07 DIAGNOSIS — Y848 Other medical procedures as the cause of abnormal reaction of the patient, or of later complication, without mention of misadventure at the time of the procedure: Secondary | ICD-10-CM | POA: Diagnosis present

## 2020-06-07 DIAGNOSIS — R918 Other nonspecific abnormal finding of lung field: Secondary | ICD-10-CM | POA: Diagnosis not present

## 2020-06-07 DIAGNOSIS — J9811 Atelectasis: Secondary | ICD-10-CM | POA: Diagnosis not present

## 2020-06-07 DIAGNOSIS — I4581 Long QT syndrome: Secondary | ICD-10-CM | POA: Diagnosis not present

## 2020-06-07 DIAGNOSIS — J9601 Acute respiratory failure with hypoxia: Secondary | ICD-10-CM | POA: Diagnosis not present

## 2020-06-07 DIAGNOSIS — E46 Unspecified protein-calorie malnutrition: Secondary | ICD-10-CM | POA: Diagnosis not present

## 2020-06-07 DIAGNOSIS — I639 Cerebral infarction, unspecified: Secondary | ICD-10-CM | POA: Diagnosis not present

## 2020-06-07 HISTORY — DX: Pleural effusion, not elsewhere classified: J90

## 2020-06-07 LAB — CBC WITH DIFFERENTIAL/PLATELET
Abs Immature Granulocytes: 0.05 10*3/uL (ref 0.00–0.07)
Basophils Absolute: 0 10*3/uL (ref 0.0–0.1)
Basophils Relative: 0 %
Eosinophils Absolute: 0.1 10*3/uL (ref 0.0–0.5)
Eosinophils Relative: 1 %
HCT: 29.8 % — ABNORMAL LOW (ref 36.0–46.0)
Hemoglobin: 9.1 g/dL — ABNORMAL LOW (ref 12.0–15.0)
Immature Granulocytes: 0 %
Lymphocytes Relative: 7 %
Lymphs Abs: 0.8 10*3/uL (ref 0.7–4.0)
MCH: 25.1 pg — ABNORMAL LOW (ref 26.0–34.0)
MCHC: 30.5 g/dL (ref 30.0–36.0)
MCV: 82.1 fL (ref 80.0–100.0)
Monocytes Absolute: 1 10*3/uL (ref 0.1–1.0)
Monocytes Relative: 9 %
Neutro Abs: 10 10*3/uL — ABNORMAL HIGH (ref 1.7–7.7)
Neutrophils Relative %: 83 %
Platelets: 481 10*3/uL — ABNORMAL HIGH (ref 150–400)
RBC: 3.63 MIL/uL — ABNORMAL LOW (ref 3.87–5.11)
RDW: 18.6 % — ABNORMAL HIGH (ref 11.5–15.5)
WBC: 11.9 10*3/uL — ABNORMAL HIGH (ref 4.0–10.5)
nRBC: 0 % (ref 0.0–0.2)

## 2020-06-07 LAB — MAGNESIUM
Magnesium: 2.1 mg/dL (ref 1.7–2.4)
Magnesium: 1.9 mg/dL (ref 1.7–2.4)

## 2020-06-07 LAB — BODY FLUID CELL COUNT WITH DIFFERENTIAL
Eos, Fluid: 0 %
Lymphs, Fluid: 67 %
Monocyte-Macrophage-Serous Fluid: 5 % — ABNORMAL LOW (ref 50–90)
Neutrophil Count, Fluid: 28 % — ABNORMAL HIGH (ref 0–25)

## 2020-06-07 LAB — TROPONIN I (HIGH SENSITIVITY): Troponin I (High Sensitivity): 35 ng/L — ABNORMAL HIGH (ref <18)

## 2020-06-07 LAB — URINALYSIS, ROUTINE W REFLEX MICROSCOPIC
Bilirubin Urine: NEGATIVE
Glucose, UA: 50 mg/dL — AB
Hgb urine dipstick: NEGATIVE
Ketones, ur: NEGATIVE mg/dL
Nitrite: NEGATIVE
Protein, ur: 30 mg/dL — AB
Specific Gravity, Urine: 1.028 (ref 1.005–1.030)
pH: 5 (ref 5.0–8.0)

## 2020-06-07 LAB — LACTATE DEHYDROGENASE: LDH: 496 U/L — ABNORMAL HIGH (ref 98–192)

## 2020-06-07 LAB — COMPREHENSIVE METABOLIC PANEL
ALT: 32 U/L (ref 0–44)
AST: 47 U/L — ABNORMAL HIGH (ref 15–41)
Albumin: 2.1 g/dL — ABNORMAL LOW (ref 3.5–5.0)
Alkaline Phosphatase: 155 U/L — ABNORMAL HIGH (ref 38–126)
Anion gap: 4 — ABNORMAL LOW (ref 5–15)
BUN: 27 mg/dL — ABNORMAL HIGH (ref 8–23)
CO2: 33 mmol/L — ABNORMAL HIGH (ref 22–32)
Calcium: 8.4 mg/dL — ABNORMAL LOW (ref 8.9–10.3)
Chloride: 100 mmol/L (ref 98–111)
Creatinine, Ser: 0.85 mg/dL (ref 0.44–1.00)
GFR, Estimated: >60 mL/min (ref >60)
Glucose, Bld: 178 mg/dL — ABNORMAL HIGH (ref 70–99)
Potassium: 4.8 mmol/L (ref 3.5–5.1)
Sodium: 137 mmol/L (ref 135–145)
Total Bilirubin: 0.9 mg/dL (ref 0.3–1.2)
Total Protein: 7.2 g/dL (ref 6.5–8.1)

## 2020-06-07 LAB — VITAMIN B12: Vitamin B-12: 978 pg/mL — ABNORMAL HIGH (ref 180–914)

## 2020-06-07 LAB — PROTEIN, PLEURAL OR PERITONEAL FLUID: Total protein, fluid: 3.1 g/dL

## 2020-06-07 LAB — GLUCOSE, PLEURAL OR PERITONEAL FLUID: Glucose, Fluid: 134 mg/dL

## 2020-06-07 LAB — IRON AND TIBC
Iron: 31 ug/dL (ref 28–170)
Saturation Ratios: 16 % (ref 10.4–31.8)
TIBC: 198 ug/dL — ABNORMAL LOW (ref 250–450)
UIBC: 167 ug/dL

## 2020-06-07 LAB — RESP PANEL BY RT-PCR (FLU A&B, COVID) ARPGX2
Influenza A by PCR: NEGATIVE
Influenza B by PCR: NEGATIVE
SARS Coronavirus 2 by RT PCR: NEGATIVE

## 2020-06-07 LAB — CBG MONITORING, ED
Glucose-Capillary: 186 mg/dL — ABNORMAL HIGH (ref 70–99)
Glucose-Capillary: 150 mg/dL — ABNORMAL HIGH (ref 70–99)
Glucose-Capillary: 113 mg/dL — ABNORMAL HIGH (ref 70–99)

## 2020-06-07 LAB — LACTATE DEHYDROGENASE, PLEURAL OR PERITONEAL FLUID: LD, Fluid: 1636 U/L — ABNORMAL HIGH (ref 3–23)

## 2020-06-07 LAB — ALBUMIN, PLEURAL OR PERITONEAL FLUID: Albumin, Fluid: 1.5 g/dL

## 2020-06-07 LAB — BRAIN NATRIURETIC PEPTIDE: B Natriuretic Peptide: 707.6 pg/mL — ABNORMAL HIGH (ref 0.0–100.0)

## 2020-06-07 LAB — FOLATE: Folate: 7.1 ng/mL (ref >5.9)

## 2020-06-07 LAB — PROTIME-INR
INR: 1.8 — ABNORMAL HIGH (ref 0.8–1.2)
Prothrombin Time: 21 seconds — ABNORMAL HIGH (ref 11.4–15.2)

## 2020-06-07 LAB — GLUCOSE, CAPILLARY: Glucose-Capillary: 110 mg/dL — ABNORMAL HIGH (ref 70–99)

## 2020-06-07 LAB — FERRITIN: Ferritin: 5299 ng/mL — ABNORMAL HIGH (ref 11–307)

## 2020-06-07 MED ORDER — SODIUM CHLORIDE 0.9 % IV SOLN
2.0000 g | Freq: Three times a day (TID) | INTRAVENOUS | Status: DC
  Administered 2020-06-07 – 2020-06-12 (×16): 2 g via INTRAVENOUS
  Filled 2020-06-07 (×17): qty 2

## 2020-06-07 MED ORDER — FERROUS GLUCONATE 324 (38 FE) MG PO TABS
324.0000 mg | ORAL_TABLET | Freq: Every day | ORAL | Status: DC
  Administered 2020-06-07 – 2020-06-21 (×15): 324 mg via ORAL
  Filled 2020-06-07 (×15): qty 1

## 2020-06-07 MED ORDER — MIDAZOLAM HCL 2 MG/2ML IJ SOLN
INTRAMUSCULAR | Status: AC | PRN
  Administered 2020-06-07 (×2): 1 mg via INTRAVENOUS

## 2020-06-07 MED ORDER — SODIUM CHLORIDE 0.9 % IV SOLN: 1.0000 g | Freq: Once | INTRAVENOUS | Status: DC

## 2020-06-07 MED ORDER — MIDAZOLAM HCL 2 MG/2ML IJ SOLN
INTRAMUSCULAR | Status: AC
  Filled 2020-06-07: qty 4

## 2020-06-07 MED ORDER — ISOSORBIDE MONONITRATE ER 30 MG PO TB24
15.0000 mg | ORAL_TABLET | Freq: Two times a day (BID) | ORAL | Status: DC
  Administered 2020-06-07 – 2020-06-08 (×4): 15 mg via ORAL
  Filled 2020-06-07 (×4): qty 1

## 2020-06-07 MED ORDER — APIXABAN 5 MG PO TABS
5.0000 mg | ORAL_TABLET | Freq: Two times a day (BID) | ORAL | Status: DC
  Administered 2020-06-07: 5 mg via ORAL
  Filled 2020-06-07: qty 1

## 2020-06-07 MED ORDER — INSULIN ASPART 100 UNIT/ML IJ SOLN: 0.0000 [IU] | INTRAMUSCULAR | Status: DC

## 2020-06-07 MED ORDER — METRONIDAZOLE 500 MG/100ML IV SOLN
500.0000 mg | Freq: Once | INTRAVENOUS | Status: AC
  Administered 2020-06-07: 500 mg via INTRAVENOUS
  Filled 2020-06-07: qty 100

## 2020-06-07 MED ORDER — AMIODARONE HCL 200 MG PO TABS
200.0000 mg | ORAL_TABLET | Freq: Two times a day (BID) | ORAL | Status: DC
  Administered 2020-06-07 – 2020-06-13 (×13): 200 mg via ORAL
  Filled 2020-06-07 (×13): qty 1

## 2020-06-07 MED ORDER — SCOPOLAMINE 1 MG/3DAYS TD PT72
1.0000 | MEDICATED_PATCH | TRANSDERMAL | Status: DC
  Administered 2020-06-07 – 2020-06-16 (×4): 1.5 mg via TRANSDERMAL
  Filled 2020-06-07 (×5): qty 1

## 2020-06-07 MED ORDER — LIDOCAINE HCL (PF) 1 % IJ SOLN
INTRAMUSCULAR | Status: AC | PRN
  Administered 2020-06-07: 10 mL

## 2020-06-07 MED ORDER — BISACODYL 10 MG RE SUPP
10.0000 mg | Freq: Once | RECTAL | Status: AC
  Administered 2020-06-07: 10 mg via RECTAL

## 2020-06-07 MED ORDER — FENTANYL CITRATE (PF) 100 MCG/2ML IJ SOLN
INTRAMUSCULAR | Status: AC
  Filled 2020-06-07: qty 2

## 2020-06-07 MED ORDER — HYDRALAZINE HCL 20 MG/ML IJ SOLN: 10.0000 mg | Freq: Four times a day (QID) | INTRAMUSCULAR | Status: DC | PRN

## 2020-06-07 MED ORDER — VANCOMYCIN HCL IN DEXTROSE 1-5 GM/200ML-% IV SOLN
1000.0000 mg | INTRAVENOUS | Status: DC
  Administered 2020-06-08 (×2): 1000 mg via INTRAVENOUS
  Filled 2020-06-07 (×2): qty 200

## 2020-06-07 MED ORDER — SODIUM CHLORIDE 0.9 % IV SOLN
2.0000 g | Freq: Once | INTRAVENOUS | Status: AC
  Administered 2020-06-07: 2 g via INTRAVENOUS
  Filled 2020-06-07: qty 2

## 2020-06-07 MED ORDER — OXYCODONE HCL 5 MG PO TABS
5.0000 mg | ORAL_TABLET | ORAL | Status: DC | PRN
  Administered 2020-06-07 – 2020-06-08 (×2): 5 mg via ORAL
  Filled 2020-06-07 (×2): qty 1

## 2020-06-07 MED ORDER — MORPHINE SULFATE (PF) 2 MG/ML IV SOLN
1.0000 mg | INTRAVENOUS | Status: DC | PRN
  Administered 2020-06-07 – 2020-06-08 (×3): 2 mg via INTRAVENOUS
  Filled 2020-06-07 (×3): qty 1

## 2020-06-07 MED ORDER — MAGNESIUM OXIDE -MG SUPPLEMENT 400 (240 MG) MG PO TABS
400.0000 mg | ORAL_TABLET | Freq: Every day | ORAL | Status: DC
  Administered 2020-06-07 – 2020-06-21 (×15): 400 mg via ORAL
  Filled 2020-06-07 (×15): qty 1

## 2020-06-07 MED ORDER — VANCOMYCIN HCL IN DEXTROSE 1-5 GM/200ML-% IV SOLN
1000.0000 mg | Freq: Once | INTRAVENOUS | Status: AC
  Administered 2020-06-07: 1000 mg via INTRAVENOUS
  Filled 2020-06-07: qty 200

## 2020-06-07 MED ORDER — ATORVASTATIN CALCIUM 40 MG PO TABS
80.0000 mg | ORAL_TABLET | Freq: Every day | ORAL | Status: DC
  Administered 2020-06-07 – 2020-06-21 (×15): 80 mg via ORAL
  Filled 2020-06-07 (×16): qty 2

## 2020-06-07 MED ORDER — FENTANYL CITRATE (PF) 100 MCG/2ML IJ SOLN
INTRAMUSCULAR | Status: AC | PRN
  Administered 2020-06-07 (×2): 50 ug via INTRAVENOUS

## 2020-06-07 MED ORDER — METRONIDAZOLE 500 MG/100ML IV SOLN
500.0000 mg | Freq: Three times a day (TID) | INTRAVENOUS | Status: DC
  Administered 2020-06-07 – 2020-06-10 (×9): 500 mg via INTRAVENOUS
  Filled 2020-06-07 (×8): qty 100

## 2020-06-07 MED ORDER — INSULIN ASPART 100 UNIT/ML IJ SOLN
0.0000 [IU] | Freq: Three times a day (TID) | INTRAMUSCULAR | Status: DC
  Administered 2020-06-07 – 2020-06-08 (×3): 2 [IU] via SUBCUTANEOUS
  Administered 2020-06-09: 5 [IU] via SUBCUTANEOUS
  Administered 2020-06-09: 2 [IU] via SUBCUTANEOUS
  Administered 2020-06-09: 5 [IU] via SUBCUTANEOUS
  Administered 2020-06-10: 2 [IU] via SUBCUTANEOUS
  Administered 2020-06-10: 3 [IU] via SUBCUTANEOUS
  Administered 2020-06-10 – 2020-06-11 (×2): 2 [IU] via SUBCUTANEOUS
  Administered 2020-06-11: 3 [IU] via SUBCUTANEOUS
  Administered 2020-06-11: 1 [IU] via SUBCUTANEOUS
  Administered 2020-06-12 (×2): 2 [IU] via SUBCUTANEOUS
  Administered 2020-06-13: 1 [IU] via SUBCUTANEOUS
  Administered 2020-06-14 – 2020-06-15 (×2): 7 [IU] via SUBCUTANEOUS
  Administered 2020-06-15: 2 [IU] via SUBCUTANEOUS
  Administered 2020-06-16: 1 [IU] via SUBCUTANEOUS
  Administered 2020-06-16: 2 [IU] via SUBCUTANEOUS
  Administered 2020-06-17: 6 [IU] via SUBCUTANEOUS
  Administered 2020-06-18 (×2): 2 [IU] via SUBCUTANEOUS
  Administered 2020-06-19 – 2020-06-20 (×2): 3 [IU] via SUBCUTANEOUS
  Administered 2020-06-21: 2 [IU] via SUBCUTANEOUS
  Administered 2020-06-21: 1 [IU] via SUBCUTANEOUS
  Filled 2020-06-07: qty 0.09

## 2020-06-07 MED ORDER — HYDRALAZINE HCL 10 MG PO TABS
10.0000 mg | ORAL_TABLET | Freq: Three times a day (TID) | ORAL | Status: DC
  Administered 2020-06-07: 10 mg via ORAL
  Filled 2020-06-07: qty 1

## 2020-06-07 MED ORDER — FENTANYL CITRATE (PF) 100 MCG/2ML IJ SOLN
12.5000 ug | INTRAMUSCULAR | Status: DC | PRN
  Administered 2020-06-07 (×3): 12.5 ug via INTRAVENOUS
  Filled 2020-06-07 (×3): qty 2

## 2020-06-07 MED ORDER — INSULIN ASPART 100 UNIT/ML IJ SOLN
0.0000 [IU] | Freq: Every day | INTRAMUSCULAR | Status: DC
  Administered 2020-06-09: 4 [IU] via SUBCUTANEOUS
  Administered 2020-06-10: 2 [IU] via SUBCUTANEOUS
  Administered 2020-06-12: 3 [IU] via SUBCUTANEOUS
  Administered 2020-06-15 – 2020-06-17 (×2): 2 [IU] via SUBCUTANEOUS
  Filled 2020-06-07: qty 0.05

## 2020-06-07 MED ORDER — SENNOSIDES-DOCUSATE SODIUM 8.6-50 MG PO TABS
1.0000 | ORAL_TABLET | Freq: Two times a day (BID) | ORAL | Status: DC
  Administered 2020-06-07 – 2020-06-21 (×27): 1 via ORAL
  Filled 2020-06-07 (×28): qty 1

## 2020-06-07 MED ORDER — AMLODIPINE BESYLATE 5 MG PO TABS
5.0000 mg | ORAL_TABLET | Freq: Every day | ORAL | Status: DC
  Administered 2020-06-07 – 2020-06-10 (×4): 5 mg via ORAL
  Filled 2020-06-07 (×4): qty 1

## 2020-06-07 MED ORDER — DIGOXIN 125 MCG PO TABS
0.1250 mg | ORAL_TABLET | Freq: Every day | ORAL | Status: DC
  Administered 2020-06-07 – 2020-06-08 (×2): 0.125 mg via ORAL
  Filled 2020-06-07 (×2): qty 1

## 2020-06-07 MED ORDER — HYDRALAZINE HCL 25 MG PO TABS
25.0000 mg | ORAL_TABLET | Freq: Three times a day (TID) | ORAL | Status: DC
  Administered 2020-06-07 (×2): 25 mg via ORAL
  Filled 2020-06-07: qty 1

## 2020-06-07 MED ORDER — INSULIN GLARGINE 100 UNIT/ML ~~LOC~~ SOLN
15.0000 [IU] | Freq: Every day | SUBCUTANEOUS | Status: DC
  Administered 2020-06-07 – 2020-06-11 (×5): 15 [IU] via SUBCUTANEOUS
  Filled 2020-06-07 (×5): qty 0.15

## 2020-06-07 NOTE — Progress Notes (Signed)
Pharmacy Antibiotic Note  [name] [name] is a 70 y.o. female admitted on 06/06/2020 with past medical history of CHF, diabetes, paroxysmal A. Fib on Eliquis, chronic digoxin, recent hospital admission with/for CVA treated with tPA and mechanical thrombectomy, post cardiac arrest this month  Pharmacy has been consulted to dose vancomycin and cefepime for CAP  Plan: Vancomycin 1gm IV q24h (AUC 464.4, Scr 0.8) Cefepime 2gm IV q8h Follow renal function, cultures and clinical course     Temp (24hrs), Avg:98.4 F (36.9 C), Min:98.4 F (36.9 C), Max:98.4 F (36.9 C)  Recent Labs  Lab 06/06/20 2137  WBC 12.9*  CREATININE 0.80    Estimated Creatinine Clearance: 69.6 mL/min (by C-G formula based on SCr of 0.8 mg/dL).    Allergies  Allergen Reactions  . Amiodarone Shortness Of Breath and Other (See Comments)    Soreness, wheezing, and weakness also  . Fruit & Vegetable Daily [Nutritional Supplements] Shortness Of Breath, Swelling and Other (See Comments)    All fruits cause tongue swelling and numbness Organic fruits are okay to eat  . Metoprolol Shortness Of Breath, Other (See Comments) and Cough    Soreness, wheezing, and weakness also  . Other Shortness Of Breath, Swelling and Other (See Comments)    Tree nuts cause tongue swelling and numbness  . Peanut-Containing Drug Products Shortness Of Breath, Swelling and Other (See Comments)    Tongue swelling and numbness  . Aspirin Nausea And Vomiting and Other (See Comments)    Cannot take due to liver issues  . Tylenol [Acetaminophen] Other (See Comments)    Cannot take due to liver issues- tolerating 650 mg tablets in 2022  . Beef-Derived Products Other (See Comments)    Limited, per patient  . Lactose Intolerance (Gi) Diarrhea and Nausea And Vomiting    Antimicrobials this admission: 5/28 cefepime >> 5/28 vanc >> 5/28 flagyl >> Dose adjustments this admission:   Microbiology results: 5/28 BCx:  5/28 UCx:   Thank you  for allowing pharmacy to be a part of this patient's care.  Arley Phenix RPh 06/07/2020, 6:48 AM

## 2020-06-07 NOTE — Consult Note (Signed)
Chief Complaint: Left sided hemithorax. Request is for chest tube placement. Left sided  Referring Physician(s): Dr. Cline Cools  Supervising Physician: Sandi Mariscal  Patient Status: [facility] - In-pt  History of Present Illness: [name] [name] is a 70 y.o. female History of DM, Cardiomyopathy, CHF, atrial arrhythmias with left ventricular dysfunction and recent  CVA (8.18.29) complicated by PEA arrest during thrombectomy. Presented to the ED at [facility] with right sided abdominal pain, nausea and vomiting. Found to have a left sided  Hemithorax though to be traumatic in nature from PEA arrest on 5.22.22. CT Chest from 5.28.22 reads Large complex multilocular left pleural effusion. This has increased in size since recent CT of the neck dated 05/12/2020. Differential includes empyema, sequela of previous hemothorax, or neoplasm. Correlation with cytology after thoracentesis may be useful. Team is requesting a left sided chest tube.  Patient alert and laying in bed states she feel "yucky: Endorses stomach and back pain with SHOB. Denies any fevers, headache, nausea, vomiting or bleeding. Return precautions and treatment recommendations and follow-up discussed with the patient who is agreeable with the plan. Per patient request procedure discussed with daughter via the telephone.  Past Medical History:  Diagnosis Date  . Abdominal pain   . Atrial flutter ([place])   . Back pain   . Chronic combined systolic and diastolic CHF (congestive heart failure) ([place]) 02/04/2020  . Constipation   . Diabetes mellitus without complication ([place])   . Fatigue   . Full dentures   . Hemorrhoids   . History of noncompliance with medical treatment, presenting hazards to health   . Hypertension   . Lack of adequate sleep   . Liver mass   . PAF (paroxysmal atrial fibrillation) ([place])   . Rash    on right breast  . Wears glasses     Past Surgical History:  Procedure Laterality Date  . BREAST DUCTAL SYSTEM  EXCISION    . BREAST EXCISIONAL BIOPSY Left   . CARDIOVERSION N/A 07/31/2019   Procedure: CARDIOVERSION;  Surgeon: Pixie Casino, MD;  Location: [facility] ENDOSCOPY;  Service: Cardiovascular;  Laterality: N/A;  . CARDIOVERSION N/A 05/19/2020   Procedure: CARDIOVERSION;  Surgeon: Jolaine Artist, MD;  Location: [facility] ENDOSCOPY;  Service: Cardiovascular;  Laterality: N/A;  . CHOLECYSTECTOMY    . CHOLECYSTECTOMY, LAPAROSCOPIC  07/24/2010  . ESOPHAGEAL DILATION    . IR CT HEAD LTD  05/12/2020  . IR PERCUTANEOUS ART THROMBECTOMY/INFUSION INTRACRANIAL INC DIAG ANGIO  05/12/2020      . IR PERCUTANEOUS ART THROMBECTOMY/INFUSION INTRACRANIAL INC DIAG ANGIO  05/12/2020  . IR US GUIDE VASC ACCESS RIGHT  05/12/2020  . RADIOLOGY WITH ANESTHESIA N/A 05/12/2020   Procedure: IR WITH ANESTHESIA;  Surgeon: Radiologist, Medication, MD;  Location: [place];  Service: Radiology;  Laterality: N/A;  . TEE WITHOUT CARDIOVERSION N/A 07/31/2019   Procedure: TRANSESOPHAGEAL ECHOCARDIOGRAM (TEE);  Surgeon: Pixie Casino, MD;  Location: [place];  Service: Cardiovascular;  Laterality: N/A;  . TEE WITHOUT CARDIOVERSION N/A 05/19/2020   Procedure: TRANSESOPHAGEAL ECHOCARDIOGRAM (TEE);  Surgeon: Jolaine Artist, MD;  Location: [facility] ENDOSCOPY;  Service: Cardiovascular;  Laterality: N/A;    Allergies: Amiodarone, Fruit & vegetable daily [nutritional supplements], Metoprolol, Other, Peanut-containing drug products, Aspirin, Tylenol [acetaminophen], Beef-derived products, and Lactose intolerance (gi)  Medications: Prior to Admission medications   Medication Sig Start Date End Date Taking? Authorizing Provider  amiodarone (PACERONE) 200 MG tablet Take 1 tablet (200 mg total) by mouth 2 (two) times daily. 05/26/20  Yes Bonnielee Haff, MD  amLODipine (NORVASC) 5 MG tablet Take 5 mg by mouth daily.   Yes [provider]  atorvastatin (LIPITOR) 80 MG tablet Take 1 tablet (80 mg total) by mouth daily. 05/27/20  Yes Bonnielee Haff,  MD  bisacodyl (DULCOLAX) 10 MG suppository Place 1 suppository (10 mg total) rectally daily as needed for moderate constipation. 05/26/20  Yes Bonnielee Haff, MD  digoxin (LANOXIN) 0.125 MG tablet Take 1 tablet (0.125 mg total) by mouth daily. 05/27/20  Yes Bonnielee Haff, MD  ELIQUIS 5 MG TABS tablet TAKE 1 TABLET(5 MG) BY MOUTH TWICE DAILY Patient taking differently: Take 5 mg by mouth 2 (two) times daily. 04/10/20  Yes Tobb, Kardie, DO  feeding supplement, GLUCERNA SHAKE, (GLUCERNA SHAKE) LIQD Take 237 mLs by mouth 3 (three) times daily between meals.   Yes [provider]  ferrous gluconate (FERGON) 324 MG tablet Take 324 mg by mouth daily.   Yes [provider]  hydrALAZINE (APRESOLINE) 10 MG tablet Take 1 tablet (10 mg total) by mouth every 8 (eight) hours. 05/26/20  Yes Bonnielee Haff, MD  insulin aspart (NOVOLOG) 100 UNIT/ML injection Inject into the skin 3 (three) times daily before meals. If BGC is less than 80, call MD  201-250, 2 units  251-300, 4 units 301-350, 6 units 351-400, 8 units 401-450, 10 units 451-500, 12 units 500 or more give 14 units of NovoLog, and recheck BCG in 2 hours   Yes [provider]  insulin glargine (LANTUS) 100 UNIT/ML Solostar Pen Inject 18 Units into the skin daily. Patient taking differently: Inject 15 Units into the skin See admin instructions. Inject 20 units into skin at 3 PM daily 02/07/20  Yes Anderson, Chelsey L, DO  Isosorbide Mononitrate (IMDUR PO) Take 20 mg by mouth 2 (two) times daily.   Yes [provider]  Lidocaine (ASPERCREME LIDOCAINE) 4 % PTCH Apply 1 patch topically 2 (two) times daily. Apply to upper back   Yes [provider]  Magnesium Oxide 400 MG CAPS Take 1 capsule (400 mg total) by mouth daily. 08/17/19  Yes Weaver, Scott T, PA-C  metaxalone (SKELAXIN) 800 MG tablet Take 1 tablet by mouth in the morning, at noon, and at bedtime. 05/29/20  Yes [provider]  Multiple Vitamin  (MULTIVITAMIN WITH MINERALS) TABS tablet Take 1 tablet by mouth daily. 05/27/20  Yes Bonnielee Haff, MD  ondansetron (ZOFRAN-ODT) 4 MG disintegrating tablet Take 4 mg by mouth every 8 (eight) hours as needed for nausea or vomiting.  06/08/20 Yes [provider]  oxyCODONE (OXY IR/ROXICODONE) 5 MG immediate release tablet Take 1 tablet (5 mg total) by mouth every 6 (six) hours as needed for severe pain. 05/26/20  Yes Bonnielee Haff, MD  pantoprazole (PROTONIX) 40 MG tablet Take 1 tablet (40 mg total) by mouth daily. 05/27/20  Yes Bonnielee Haff, MD  polyethylene glycol (MIRALAX / GLYCOLAX) 17 g packet Take 17 g by mouth 2 (two) times daily. 05/26/20  Yes Bonnielee Haff, MD  senna-docusate (SENOKOT-S) 8.6-50 MG tablet Take 2 tablets by mouth 2 (two) times daily. 05/26/20  Yes Bonnielee Haff, MD  acetaminophen (TYLENOL) 325 MG tablet Take 650 mg by mouth 3 (three) times daily as needed for mild pain or moderate pain.    [provider]  B-D UF III MINI PEN NEEDLES 31G X 5 MM MISC USE WITH LANTUS PEN DAILY AS DIRECTED 02/11/20   Carollee Leitz, MD  Blood Pressure KIT Monitor blood pressure twice a  day 09/28/19   Carollee Leitz, MD  furosemide (LASIX) 40 MG tablet Take 79m daily as needed whenever weight is greater than 190lbs. Patient not taking: Reported on 06/07/2020 05/26/20   KBonnielee Haff MD  glucose blood (ACCU-CHEK AVIVA) test strip Use as instructed 06/16/17   HZenia Resides MD  isosorbide mononitrate (IMDUR) 30 MG 24 hr tablet Take 0.5 tablets (15 mg total) by mouth daily. Patient not taking: Reported on 06/07/2020 05/26/20   KBonnielee Haff MD  Lancets ([facility]K [facility]FT [facility]anc[facility] Once daily testing. 06/16/17   HZenia Resides MD  Nutritional Supplements (,FEEDING SUPPLEMENT, PROSOURCE PLUS) liquid Take 30 mLs by mouth 2 (two) times daily between meals. Patient not taking: Reported on 06/07/2020 05/26/20   KBonnielee Haff MD     Family History  Problem Relation Age of Onset   . Stroke Mother   . Other Father        broken heart after spouse spouse  . Diabetes Sister   . Hypertension Sister   . Diabetes Sister   . Multiple sclerosis Sister   . Alcohol abuse Other   . Diabetes Other   . Breast cancer Neg Hx     Social History   Socioeconomic History  . Marital status: Single    Spouse name: Not on file  . Number of children: 3  . Years of education: Not on file  . Highest education level: High school graduate  Occupational History  . 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 Smoking status: Never Smoker  . Smokeless tobacco: Never Used  Vaping Use  . Vaping Use: Never used  Substance and Sexual Activity  . Alcohol use: No    Alcohol/week: 0.0 standard drinks  . Drug use: No  . Sexual activity: Not Currently  Other Topics Concern  . Not on file  Social History Narrative   Patient lives alone in an apartment complex for seniors.   Patient does not drive. She walks to surrounding places near her complex or her children run errands for her. Patient never goes without.   Patient enjoys sewing, crafts, shopping, and walking.    Patient has 3 children and she is close with all of them.        Social Determinants of Health   Financial Resource Strain: Low Risk   . Difficulty of Paying Living Expenses: Not very hard  Food Insecurity: No Food Insecurity  . Worried About RCharity fundraiserin the Last Year: Never true  . Ran Out of Food in the Last Year: Never true  Transportation Needs: Unmet Transportation Needs  . Lack of Transportation (Medical): Yes  . Lack of Transportation (Non-Medical): Yes  Physical Activity: Not on file  Stress: Not on file  Social Connections: Not on file      Review of Systems: A 12 point ROS discussed and pertinent positives are indicated in the HPI above.  All other systems are negative.  Review of Systems  Constitutional: Negative for fatigue and fever.  HENT: Negative for  congestion.   Respiratory: Positive for shortness of breath. Negative for cough.   Gastrointestinal: Negative for abdominal pain, diarrhea, nausea and vomiting.    Vital Signs: BP (!) 207/78   Pulse 84   Temp 99 F (37.2 C) (Oral)   Resp (!) 32   SpO2 96%   Physical Exam Vitals and nursing note reviewed.  Constitutional:      Appearance:  She is well-developed.  HENT:     Head: Normocephalic and atraumatic.  Eyes:     Conjunctiva/sclera: Conjunctivae normal.  Cardiovascular:     Rate and Rhythm: Normal rate and regular rhythm.     Heart sounds: Normal heart sounds.  Pulmonary:     Comments: On [place] with SHOB and use of accessory muscles.  Musculoskeletal:        General: Normal range of motion.     Cervical back: Normal range of motion.  Skin:    General: Skin is warm.  Neurological:     Mental Status: She is alert and oriented to person, place, and time.     Imaging: CT Abdomen Pelvis Wo Contrast  Result Date: 06/06/2020 CLINICAL DATA:  Right lower quadrant abdominal pain, nausea EXAM: CT ABDOMEN AND PELVIS WITHOUT CONTRAST TECHNIQUE: Multidetector CT imaging of the abdomen and pelvis was performed following the standard protocol without IV contrast. COMPARISON:  05/13/2020, 05/12/2020, 07/20/2015 FINDINGS: Lower chest: There is a large multiloculated heterogeneous left pleural effusion, with areas of higher attenuation seen anteriorly and posteriorly. There is complete atelectasis of the visualized left lung. Right lung is clear. The heart is unremarkable. Unenhanced CT was performed per clinician order. Lack of IV contrast limits sensitivity and specificity, especially for evaluation of abdominal/pelvic solid viscera. Hepatobiliary: No focal liver abnormality is seen. Status post cholecystectomy. No biliary dilatation. Pancreas: Unremarkable. No pancreatic ductal dilatation or surrounding inflammatory changes. Spleen: Indeterminate 1.3 cm hypodensity is seen within the  posterior aspect of the spleen, image 37/2. Spleen is normal in size. Adrenals/Urinary Tract: No urinary tract calculi or obstructive uropathy within either kidney. The adrenals and bladder are unremarkable. Stomach/Bowel: No bowel obstruction or ileus. Normal appendix right lower quadrant. No bowel wall thickening or inflammatory change. Vascular/Lymphatic: Aortic atherosclerosis. No enlarged abdominal or pelvic lymph nodes. Reproductive: Status post hysterectomy. No adnexal masses. Other: No free fluid or free gas.  No abdominal wall hernia. Musculoskeletal: There are subacute healing right anterolateral fourth and fifth rib fractures with minimal callus formation. No acute bony abnormalities. Reconstructed images demonstrate no additional findings. IMPRESSION: 1. Large complex loculated left pleural effusion, with complete atelectasis of the visualized left lower lobe. Differential diagnosis would include empyema or sequela of hemothorax. Dedicated CT of the chest may be useful to visualize this abnormality in its entirety. 2. No acute intra-abdominal or intrapelvic process. Normal appendix right lower quadrant. 3. Indeterminate hypodensity posterior aspect of the spleen, which could reflect a small cyst or hemangioma. 4.  Aortic Atherosclerosis (ICD10-I70.0). Electronically Signed   By: Randa Ngo M.D.   On: 06/06/2020 23:02   DG Chest 2 View  Result Date: 06/06/2020 CLINICAL DATA:  Large left effusion on recent CT of the abdomen EXAM: CHEST - 2 VIEW COMPARISON:  05/13/2020 FINDINGS: Delete opacification of the left hemithorax is noted. Cardiac shadow is stable. Right lung is clear. No acute bony abnormality is noted. IMPRESSION: Opacification of the left hemithorax consistent with the complex diffusion seen on recent CT of the abdomen. Electronically Signed   By: Inez Catalina M.D.   On: 06/06/2020 23:22   DG Abd 1 View  Result Date: 05/12/2020 CLINICAL DATA:  Enteric catheter placement EXAM: ABDOMEN  - 1 VIEW COMPARISON:  None. FINDINGS: Frontal view of the lower chest and upper abdomen demonstrates enteric catheter passing below diaphragm tip overlying proximal duodenum. External defibrillator pads overlie the lower chest. Bowel gas pattern is unremarkable. IMPRESSION: 1. Enteric catheter tip projecting over proximal duodenum.  Electronically Signed   By: Randa Ngo M.D.   On: 05/12/2020 16:28   CT ANGIO NECK W OR WO CONTRAST  Result Date: 05/12/2020 CLINICAL DATA:  Left-sided weakness EXAM: CT ANGIOGRAPHY HEAD AND NECK TECHNIQUE: Multidetector CT imaging of the head and neck was performed using the standard protocol during bolus administration of intravenous contrast. Multiplanar CT image reconstructions and MIPs were obtained to evaluate the vascular anatomy. Carotid stenosis measurements (when applicable) are obtained utilizing NASCET criteria, using the distal internal carotid diameter as the denominator. CONTRAST:  4m OMNIPAQUE IOHEXOL 350 MG/ML SOLN COMPARISON:  Noncontrast head CT performed earlier today 05/12/2020. FINDINGS: CTA NECK FINDINGS Aortic arch: Standard aortic branching. No hemodynamically significant innominate or proximal subclavian artery stenosis. Right carotid system: CCA and ICA patent within the neck without significant stenosis (50% or greater). Mild soft and calcified plaque within the carotid bifurcation and proximal ICA. Asymmetrically diminished enhancement of the cervical right ICA, likely due to downstream occlusion. Left carotid system: CCA and ICA patent within the neck without significant stenosis (50% or greater). Mild soft and calcified plaque within the carotid bifurcation and proximal ICA. Vertebral arteries: Patent within the neck without stenosis. Duplicated proximal left vertebral artery. Skeleton: No acute bony abnormality or aggressive osseous lesion. Cervical spondylosis. Other neck: Asymmetric prominence of the right thyroid lobe. Underlying right thyroid  lobe nodule measuring at least 2.8 cm. Upper chest: Partially imaged left pleural effusion. Review of the MIP images confirms the above findings CTA HEAD FINDINGS Anterior circulation: Asymmetrically diminished enhancement and caliber of the right ICA siphon, likely secondary to downstream occlusion. Absent enhancement within the right ICA terminus, proximal M1 right MCA and proximal A1 right ACA. Findings are compatible with T-shaped occlusion secondary to thrombus. There is some reconstitution of enhancement more distally within the M1 right MCA and within right M2 vessels. There is reconstitution of enhancement within the distal A1 right ACA. The intracranial left ICA is patent. No left M2 proximal branch occlusion or high-grade proximal stenosis is identified. Developmentally hypoplastic A1 left ACA. The A2 and more distal anterior cerebral arteries are patent. No intracranial aneurysm is identified. Posterior circulation: The intracranial vertebral arteries are patent. The basilar artery is patent. The posterior cerebral arteries are patent. A right posterior communicating artery is present. The left posterior communicating artery is hypoplastic or absent. Venous sinuses: Within the limitations of contrast timing, no convincing thrombus. Anatomic variants: As described Review of the MIP images confirms the above findings Critical/emergent results were called by telephone at the time of interpretation on 05/12/2020 at 12:53 pm [facility] verbally acknowledged these results. IMPRESSION: CTA neck: 1. The common carotid, internal carotid and vertebral arteries are patent within the neck without hemodynamically significant stenosis. Mild atherosclerotic disease within the carotid bifurcations and proximal ICAs. 2. Asymmetrically diminished enhancement of the cervical right ICA, likely secondary to downstream occlusion. 3. 2.8 cm right thyroid lobe nodule. Non-emergent thyroid ultrasound is  recommended for further evaluation. CTA head: T-shaped large vessel occlusion involving the right ICA terminus, proximal M1 right middle cerebral artery and proximal A1 right anterior cerebral artery. Electronically Signed   By: KKellie SimmeringDO   On: 05/12/2020 13:13   CT Chest Wo Contrast  Result Date: 06/07/2020 CLINICAL DATA:  Left pleural effusion, pneumonia, abnormal chest x-ray and abdominal CT EXAM: CT CHEST WITHOUT CONTRAST TECHNIQUE: Multidetector CT imaging of the chest was performed following the standard protocol without IV contrast. COMPARISON:  04/06/2010, 06/06/2020 FINDINGS:  Cardiovascular: Unenhanced imaging of the heart and great vessels demonstrates no pericardial effusion. There is normal caliber of the thoracic aorta. Mild atherosclerosis of the aorta and coronary vessels. Mediastinum/Nodes: Chronic heterogeneous enlargement of the right lobe of the thyroid has been unchanged since 2012, compatible with goiter. This is been previously evaluated by ultrasound. Trachea and esophagus are unremarkable. Subcentimeter mediastinal adenopathy identified, largest measuring 8 mm in the AP window. Lungs/Pleura: There is complete atelectasis of the left lung. Heterogeneous multilocular left pleural effusion is identified, occupying the entire left hemithorax. Areas of higher attenuation are seen within the fluid collection anteriorly and posteriorly at the level of the hilum. Scattered hypoventilatory changes are seen within the right lung. Otherwise the right hemithorax is clear. Central airways are patent. Upper Abdomen: No acute abnormality. Musculoskeletal: No acute or destructive bony lesions. Reconstructed images demonstrate no additional findings. IMPRESSION: 1. Large complex multilocular left pleural effusion. This has increased in size since recent CT of the neck dated 05/12/2020. Differential includes empyema, sequela of previous hemothorax, or neoplasm. Correlation with cytology after  thoracentesis may be useful. 2. Complete left lung atelectasis. 3. Stable heterogeneous enlargement right lobe thyroid compatible with goiter. This has been evaluated on previous imaging. Please refer to thyroid ultrasound dated 05/14/2020. (Ref: J Am Coll Radiol. 2015 Feb;12(2): 143-50). 4.  Aortic Atherosclerosis (ICD10-I70.0). Electronically Signed   By: Randa Ngo M.D.   On: 06/07/2020 00:31   MR ANGIO HEAD WO CONTRAST  Result Date: 05/13/2020 CLINICAL DATA:  Follow-up stroke with intervention EXAM: MRI HEAD WITHOUT CONTRAST MRA HEAD WITHOUT CONTRAST TECHNIQUE: Multiplanar, multiecho pulse sequences of the brain and surrounding structures were obtained without intravenous contrast. Angiographic images of the head were obtained using MRA technique without contrast. COMPARISON:  CT and interventional studies of yesterday. FINDINGS: MRI HEAD FINDINGS Brain: Diffusion imaging shows a few scattered subcentimeter and punctate infarctions in the right middle cerebral artery territory affecting the posterior frontal, parietal and parietooccipital brain. No large confluent infarction. No other vascular territory insult. Elsewhere, there are mild chronic small-vessel ischemic changes of the cerebral hemispheric white matter. No sign of swelling or hemorrhage. No hydrocephalus or extra-axial collection. Vascular: Major vessels at the base of the brain show flow. Skull and upper cervical spine: Negative Sinuses/Orbits: Clear/normal Other: None MRA HEAD FINDINGS Both internal carotid arteries are widely patent through the skull base and siphon regions. Left ICA supplies only the middle cerebral artery territory. Right ICA supplies the middle cerebral artery territory and both posterior cerebral artery territories. Major vessels all show flow. Both vertebral arteries widely patent to the basilar. No basilar stenosis. Posterior circulation branch vessels appear normal. IMPRESSION: Successful vascular recanalization. No  large or medium vessel occlusion presently. Several scattered subcentimeter and punctate acute infarctions in the periphery of the right middle cerebral artery territory consistent with minimal embolic insults. No swelling or hemorrhage. Electronically Signed   By: Nelson Chimes M.D.   On: 05/13/2020 14:02   MR BRAIN WO CONTRAST  Result Date: 05/13/2020 CLINICAL DATA:  Follow-up stroke with intervention EXAM: MRI HEAD WITHOUT CONTRAST MRA HEAD WITHOUT CONTRAST TECHNIQUE: Multiplanar, multiecho pulse sequences of the brain and surrounding structures were obtained without intravenous contrast. Angiographic images of the head were obtained using MRA technique without contrast. COMPARISON:  CT and interventional studies of yesterday. FINDINGS: MRI HEAD FINDINGS Brain: Diffusion imaging shows a few scattered subcentimeter and punctate infarctions in the right middle cerebral artery territory affecting the posterior frontal, parietal and parietooccipital brain. No large  confluent infarction. No other vascular territory insult. Elsewhere, there are mild chronic small-vessel ischemic changes of the cerebral hemispheric white matter. No sign of swelling or hemorrhage. No hydrocephalus or extra-axial collection. Vascular: Major vessels at the base of the brain show flow. Skull and upper cervical spine: Negative Sinuses/Orbits: Clear/normal Other: None MRA HEAD FINDINGS Both internal carotid arteries are widely patent through the skull base and siphon regions. Left ICA supplies only the middle cerebral artery territory. Right ICA supplies the middle cerebral artery territory and both posterior cerebral artery territories. Major vessels all show flow. Both vertebral arteries widely patent to the basilar. No basilar stenosis. Posterior circulation branch vessels appear normal. IMPRESSION: Successful vascular recanalization. No large or medium vessel occlusion presently. Several scattered subcentimeter and punctate acute  infarctions in the periphery of the right middle cerebral artery territory consistent with minimal embolic insults. No swelling or hemorrhage. Electronically Signed   By: Nelson Chimes M.D.   On: 05/13/2020 14:02   IR CT Head Ltd  Result Date: 05/13/2020 INDICATION: 70 year old female with past medical history significant for atrial fibrillation and CHF. She was last known well at 10 a.m. on 05/12/2020. At 11:30 a.m., she was noted to have facial droop and slurred speech. She was brought by EMS to ED. At presentation, she head left-sided hemiparesis with NIHSS 6. Head CT showed no evidence of a large territorial infarct or hemorrhage. CT angiogram showed an occlusion of the right ICA terminus extending into the proximal M1 and A1. IV tPA bolus was administered at 13:15. She was then taken to our service for a mechanical thrombectomy. EXAM: ULTRASOUND-GUIDED VASCULAR ACCESS DIAGNOSTIC CEREBRAL ANGIOGRAM MECHANICAL THROMBECTOMY FLAT PANEL HEAD CT COMPARISON:  CT/CT angiogram of the head and neck May 12, 2020 MEDICATIONS: Refer to anesthesia documentation ANESTHESIA/SEDATION: The procedure was performed in the general anesthesia. CONTRAST:  50 mL of Omnipaque 300. FLUOROSCOPY TIME:  Fluoroscopy Time: 8 minutes (233 mGy). COMPLICATIONS: None immediate. TECHNIQUE: Informed written consent was obtained from the patient's daughter after a thorough discussion of the procedural risks, benefits and alternatives. All questions were addressed. Maximal Sterile Barrier Technique was utilized including caps, mask, sterile gowns, sterile gloves, sterile drape, hand hygiene and skin antiseptic. A timeout was performed prior to the initiation of the procedure. The right groin was prepped and draped in the usual sterile fashion. Using a micropuncture kit and the modified Seldinger technique, access was gained to the right common femoral artery and an 8 French sheath was placed. Real-time ultrasound guidance was utilized for vascular  access including the acquisition of a permanent ultrasound image documenting patency of the accessed vessel. Under fluoroscopy, a Zoom 88 guide catheter was navigated over a 6 [place] Berenstein 2 catheter and a 0.035" Terumo Glidewire into the aortic arch. The catheter was placed into the right common carotid artery. Frontal and lateral angiograms of the head and neck were obtained. FINDINGS: Patent right common femoral artery. There is no occlusion of the intracranial right internal carotid just distal to the origin of the right posterior communicating artery. Minimal distal contrast penetration is seen. PROCEDURE: Under biplane roadmap, the catheter construct was advanced over the Glidewire into the cervical right internal carotid artery. The Berenstein catheter was removed. Under fluoroscopy, a zoom 71 aspiration catheter was navigated through the zoom 88 guide catheter and over an Aristotle 18 micro guidewire into the supraclinoid right ICA. The zoom 71 catheter was connected to a penumbra aspiration pump in continuous aspiration was performed for approximately 1 minute.  Continuous aspiration was also performed concomitantly in the zoom 88 catheter with a 60 cc vacuum pressure syringe. The aspiration catheter was removed under constant aspiration. The guiding catheter was aspirated until clear blood was obtained. Right internal carotid artery angiogram showed complete recanalization of the right ICA (TICI3). Delay right ICA angiograms showed persistent patency of the right ICA, right MCA ACA vascular trees. Flat panel CT of the head was obtained and post processed in a separate workstation with concurrent attending physician supervision. Selected images were sent to PACS. No evidence of hemorrhagic complication. A right common femoral artery angiogram was obtained with frontal view. The access is at the level of the right common femoral artery which has adequate caliber for closure device. The femoral sheath was  exchanged over the wire for a Perclose prostyle which was utilized for access closure. Immediate hemostasis was achieved. Patient became hypotensive while awaiting COVID test result for extubation, after intervention was concluded and eventually progressed to PEA arrest. ROSC obtained after 2 rounds of chest compressions. Patient stabilized and transferred to ICU. Please see anesthesia documentation for further details. Family updated by telephone. IMPRESSION: 1. Successful mechanical thrombectomy for treatment of a right ICA terminus occlusion with direct contact aspiration. Total of 1 pass with complete recanalization (TICI 3). 2. No thromboembolic or hemorrhagic complication. 3. Postprocedural PEA cardiac arrest with ROSC obtained after 2 rounds of chest compression. Please see anesthesia documentation for further details. PLAN: 1. Transferd to ICU for further care. 2. Follow-up head CT/MRI within 24 hours. Electronically Signed   By: Pedro Earls M.D.   On: 05/13/2020 10:33   IR US Guide Vasc Access Right  Result Date: 05/13/2020 INDICATION: 70 year old female with past medical history significant for atrial fibrillation and CHF. She was last known well at 10 a.m. on 05/12/2020. At 11:30 a.m., she was noted to have facial droop and slurred speech. She was brought by EMS to ED. At presentation, she head left-sided hemiparesis with NIHSS 6. Head CT showed no evidence of a large territorial infarct or hemorrhage. CT angiogram showed an occlusion of the right ICA terminus extending into the proximal M1 and A1. IV tPA bolus was administered at 13:15. She was then taken to our service for a mechanical thrombectomy. EXAM: ULTRASOUND-GUIDED VASCULAR ACCESS DIAGNOSTIC CEREBRAL ANGIOGRAM MECHANICAL THROMBECTOMY FLAT PANEL HEAD CT COMPARISON:  CT/CT angiogram of the head and neck May 12, 2020 MEDICATIONS: Refer to anesthesia documentation ANESTHESIA/SEDATION: The procedure was performed in the general  anesthesia. CONTRAST:  50 mL of Omnipaque 300. FLUOROSCOPY TIME:  Fluoroscopy Time: 8 minutes (233 mGy). COMPLICATIONS: None immediate. TECHNIQUE: Informed written consent was obtained from the patient's daughter after a thorough discussion of the procedural risks, benefits and alternatives. All questions were addressed. Maximal Sterile Barrier Technique was utilized including caps, mask, sterile gowns, sterile gloves, sterile drape, hand hygiene and skin antiseptic. A timeout was performed prior to the initiation of the procedure. The right groin was prepped and draped in the usual sterile fashion. Using a micropuncture kit and the modified Seldinger technique, access was gained to the right common femoral artery and an 8 French sheath was placed. Real-time ultrasound guidance was utilized for vascular access including the acquisition of a permanent ultrasound image documenting patency of the accessed vessel. Under fluoroscopy, a Zoom 88 guide catheter was navigated over a 6 [place] Berenstein 2 catheter and a 0.035" Terumo Glidewire into the aortic arch. The catheter was placed into the right common carotid artery. Frontal and  lateral angiograms of the head and neck were obtained. FINDINGS: Patent right common femoral artery. There is no occlusion of the intracranial right internal carotid just distal to the origin of the right posterior communicating artery. Minimal distal contrast penetration is seen. PROCEDURE: Under biplane roadmap, the catheter construct was advanced over the Glidewire into the cervical right internal carotid artery. The Berenstein catheter was removed. Under fluoroscopy, a zoom 71 aspiration catheter was navigated through the zoom 88 guide catheter and over an Aristotle 18 micro guidewire into the supraclinoid right ICA. The zoom 71 catheter was connected to a penumbra aspiration pump in continuous aspiration was performed for approximately 1 minute. Continuous aspiration was also performed  concomitantly in the zoom 88 catheter with a 60 cc vacuum pressure syringe. The aspiration catheter was removed under constant aspiration. The guiding catheter was aspirated until clear blood was obtained. Right internal carotid artery angiogram showed complete recanalization of the right ICA (TICI3). Delay right ICA angiograms showed persistent patency of the right ICA, right MCA ACA vascular trees. Flat panel CT of the head was obtained and post processed in a separate workstation with concurrent attending physician supervision. Selected images were sent to PACS. No evidence of hemorrhagic complication. A right common femoral artery angiogram was obtained with frontal view. The access is at the level of the right common femoral artery which has adequate caliber for closure device. The femoral sheath was exchanged over the wire for a Perclose prostyle which was utilized for access closure. Immediate hemostasis was achieved. Patient became hypotensive while awaiting COVID test result for extubation, after intervention was concluded and eventually progressed to PEA arrest. ROSC obtained after 2 rounds of chest compressions. Patient stabilized and transferred to ICU. Please see anesthesia documentation for further details. Family updated by telephone. IMPRESSION: 1. Successful mechanical thrombectomy for treatment of a right ICA terminus occlusion with direct contact aspiration. Total of 1 pass with complete recanalization (TICI 3). 2. No thromboembolic or hemorrhagic complication. 3. Postprocedural PEA cardiac arrest with ROSC obtained after 2 rounds of chest compression. Please see anesthesia documentation for further details. PLAN: 1. Transferd to ICU for further care. 2. Follow-up head CT/MRI within 24 hours. Electronically Signed   By: Pedro Earls M.D.   On: 05/13/2020 10:33   DG CHEST PORT 1 VIEW  Result Date: 05/13/2020 CLINICAL DATA:  Respiratory failure.  Hypoxia. EXAM: PORTABLE CHEST 1  VIEW COMPARISON:  05/12/2020. FINDINGS: NG tube noted with tip below left hemidiaphragm. Endotracheal tube and right IJ line in stable position. Severe cardiomegaly again noted. Diffuse bilateral pulmonary infiltrates/edema noted on today's exam. Left base atelectasis. Probable moderate left pleural effusion. No pneumothorax. IMPRESSION: 1. Interim placement of NG tube. Endotracheal tube right IJ line stable position. 2.  Severe cardiomegaly again noted. 3. Diffuse bilateral pulmonary infiltrates/edema noted on today's exam. Left base atelectasis. Probable moderate left pleural effusion. Electronically Signed   By: Marcello Moores  Register   On: 05/13/2020 05:56   Portable Chest x-ray  Result Date: 05/12/2020 CLINICAL DATA:  Stroke and respiratory failure. EXAM: PORTABLE CHEST 1 VIEW COMPARISON:  02/04/2020 FINDINGS: Stable cardiac enlargement. Endotracheal tube present with the tip approximately 3 cm above the carina. Right jugular central line present with the tip in the lower SVC. No pneumothorax. Left lower lobe atelectasis/consolidation present. No significant pulmonary edema or pleural fluid. IMPRESSION: Left lower lobe atelectasis/consolidation.  No pneumothorax. Electronically Signed   By: Aletta Edouard M.D.   On: 05/12/2020 16:25   ECHOCARDIOGRAM COMPLETE  Result Date: 05/12/2020    ECHOCARDIOGRAM REPORT   Patient Name:   [name] [name] Date of Exam: 05/12/2020 Medical Rec #:  [id number]         Height:       63.0 in Accession #:    [id number]        Weight:       198.0 lb Date of Birth:  1950-05-17        BSA:          1.925 m Patient Age:    3 years          BP:           223/89 mmHg Patient Gender: F                 HR:           113 bpm. Exam Location:  Inpatient Procedure: 2D Echo, Cardiac Doppler and Color Doppler STAT ECHO REPORT CONTAINS CRITICAL RESULT Indications:    Stroke.; I50.40* Unspecified combined systolic (congestive) and                 diastolic (congestive) heart failure  History:         Patient has prior history of Echocardiogram examinations. CHF,                 Abnormal ECG, Stroke, Arrythmias:Atrial Fibrillation,                 Signs/Symptoms:Hypotension, Shortness of Breath and Dyspnea;                 Risk Factors:Hypertension and Dyslipidemia. Pericardial                 effusion.  Sonographer:    Roseanna Rainbow RDCS Referring Phys: 64 DAYNA N DUNN  Sonographer Comments: Technically difficult study due to poor echo windows, patient is morbidly obese and echo performed with patient supine and on artificial respirator. Image acquisition challenging due to patient body habitus. IMPRESSIONS  1. Left ventricular ejection fraction, by estimation, is <20%. The left ventricle has severely decreased function. The left ventricle demonstrates global hypokinesis. There is severe concentric left ventricular hypertrophy. Diastolic function is indeterminant due to atrial fibrillation. No LV thrombus visualized, however, apex incompletely visualized due to lack of definity contrast.  2. Right ventricular systolic function is severely reduced. The right ventricular size is mildly enlarged. There is mildly elevated pulmonary artery systolic pressure. The estimated right ventricular systolic pressure is 40.9 mmHg.  3. A large, circumferential pericardial effusion is present. The effusion appears to be greatest around the posterolateral LV measuring up to 2.2cm at end-diastole. There is no RV or RA collapse to suggest tamponade. IVC is dilated and not collapsible but patient is on the vent with significant TR, which are likely the primary drivers of IVC dilation.  4. The mitral valve is normal in structure. Mild mitral valve regurgitation.  5. Tricuspid valve regurgitation is moderate to severe.  6. The aortic valve is tricuspid. There is mild calcification of the aortic valve. There is mild thickening of the aortic valve. Aortic valve regurgitation is trivial. Mild aortic valve sclerosis is present, with no  evidence of aortic valve stenosis.  7. Left atrial size was mildly dilated. Comparison(s): Compared to prior TTE in 01/2020, the LVEF is now severely reduced (<20%) and a large pericardial effusion is present (previously small). The RV systolic function now appears severely reduced. FINDINGS  Left Ventricle: Left ventricular ejection  fraction, by estimation, is <20%. The left ventricle has severely decreased function. The left ventricle demonstrates global hypokinesis. The left ventricular internal cavity size was normal in size. There is severe concentric left ventricular hypertrophy. Diastolic function is indeterminant due to atrial fibrillation. No LV thrombus visualized, however, apex incompletely visualized due to lack of definity contrast. Right Ventricle: The right ventricular size is mildly enlarged. No increase in right ventricular wall thickness. Right ventricular systolic function is severely reduced. There is mildly elevated pulmonary artery systolic pressure. The tricuspid regurgitant velocity is 2.53 m/s, and with an assumed right atrial pressure of 15 mmHg, the estimated right ventricular systolic pressure is 61.9 mmHg. Left Atrium: Left atrial size was mildly dilated. Right Atrium: Right atrial size was normal in size. Pericardium: A large, circumferential pericardial effusion is present. The effusion appears to be greatest around the posterolateral LV measuring up to 2.2cm at end-diastole. There is no RV or RA collapse to suggest tamponade. IVC is dilated and not collapsible but patient is on the vent with significant TR, which are likely the primary drivers of IVC dilation. Mitral Valve: The mitral valve is normal in structure. There is mild thickening of the mitral valve leaflet(s). There is mild calcification of the mitral valve leaflet(s). Mild mitral annular calcification. Mild mitral valve regurgitation. Tricuspid Valve: The tricuspid valve is normal in structure. Tricuspid valve regurgitation  is moderate to severe. The flow in the hepatic veins is reversed during ventricular systole. Aortic Valve: The aortic valve is tricuspid. There is mild calcification of the aortic valve. There is mild thickening of the aortic valve. Aortic valve regurgitation is trivial. Mild aortic valve sclerosis is present, with no evidence of aortic valve stenosis. Pulmonic Valve: The pulmonic valve was not well visualized. Pulmonic valve regurgitation is trivial. Aorta: The aortic root and ascending aorta are structurally normal, with no evidence of dilitation. IAS/Shunts: No atrial level shunt detected by color flow Doppler. Additional Comments: There is a small pleural effusion in the left lateral region.  LEFT VENTRICLE PLAX 2D LVIDd:         4.80 cm      Diastology LVIDs:         4.60 cm      LV e' medial:    4.13 cm/s LV PW:         1.40 cm      LV E/e' medial:  14.9 LV IVS:        1.60 cm      LV e' lateral:   6.90 cm/s LVOT diam:     2.10 cm      LV E/e' lateral: 8.9 LV SV:         21 LV SV Index:   11 LVOT Area:     3.46 cm  LV Volumes (MOD) LV vol d, MOD A2C: 104.0 ml LV vol d, MOD A4C: 63.1 ml LV vol s, MOD A2C: 97.8 ml LV vol s, MOD A4C: 54.0 ml LV SV MOD A2C:     6.2 ml LV SV MOD A4C:     63.1 ml LV SV MOD BP:      5.5 ml RIGHT VENTRICLE            IVC RV S prime:     5.70 cm/s  IVC diam: 2.20 cm TAPSE (M-mode): 1.0 cm LEFT ATRIUM             Index       RIGHT ATRIUM  Index LA diam:        3.40 cm 1.77 cm/m  RA Area:     20.70 cm LA Vol (A2C):   53.4 ml 27.74 ml/m RA Volume:   65.50 ml  34.02 ml/m LA Vol (A4C):   60.4 ml 31.37 ml/m LA Biplane Vol: 59.7 ml 31.01 ml/m  AORTIC VALVE             PULMONIC VALVE LVOT Vmax:   58.70 cm/s  PR End Diast Vel: 2.11 msec LVOT Vmean:  34.400 cm/s LVOT VTI:    0.061 m  AORTA Ao Root diam: 3.30 cm MITRAL VALVE               TRICUSPID VALVE MV Area (PHT): 6.17 cm    TR Peak grad:   25.6 mmHg MV Decel Time: 123 msec    TR Vmax:        253.00 cm/s MV E velocity: 61.38  cm/s                            SHUNTS                            Systemic VTI:  0.06 m                            Systemic Diam: 2.10 cm Gwyndolyn Kaufman MD Electronically signed by Gwyndolyn Kaufman MD Signature Date/Time: 05/12/2020/7:24:37 PM    Final    US THYROID  Result Date: 05/15/2020 CLINICAL DATA:  Palpable abnormality.  Palpable thyroid nodule. EXAM: THYROID ULTRASOUND TECHNIQUE: Ultrasound examination of the thyroid gland and adjacent soft tissues was performed. COMPARISON:  None. FINDINGS: Parenchymal Echotexture: Normal Isthmus: Normal in size measuring 0.4 cm in diameter Right lobe: Enlarged measuring 5.6 x 3.1 x 4.1 cm Left lobe: Normal in size measuring 4.2 x 0.9 x 1.7 cm _________________________________________________________ Estimated total number of nodules >/= 1 cm: 1 Number of spongiform nodules >/=  2 cm not described below (TR1): 0 Number of mixed cystic and solid nodules >/= 1.5 cm not described below (TR2): 0 _________________________________________________________ Nodule # 1: Location: Right; Mid Maximum size: 4.9 cm; Other 2 dimensions: 3.9 x 3.2 cm Composition: solid/almost completely solid (2) Echogenicity: hypoechoic (2) Shape: not taller-than-wide (0) Margins: ill-defined (0) Echogenic foci: none (0) ACR TI-RADS total points: 4. ACR TI-RADS risk category: TR4 (4-6 points). ACR TI-RADS recommendations: **Given size (>/= 1.5 cm) and appearance, fine needle aspiration of this moderately suspicious nodule should be considered based on TI-RADS criteria. _________________________________________________________ There is an approximately 0.6 x 0.5 x 0.4 cm anechoic cyst within the inferior pole the left lobe of the thyroid (labeled 2), which does not meet criteria to recommend percutaneous sampling or continued dedicated follow-up. IMPRESSION: 1. Right-sided thyroid nodule/mass, likely correlating with the palpable area of concern, meets imaging criteria to recommend percutaneous  sampling as indicated. 2. Additional solitary punctate left-sided thyroid cyst does not meet criteria to recommend percutaneous sampling or continued dedicated follow-up The above is in keeping with the ACR TI-RADS recommendations - J Am Coll Radiol 2017;14:587-595. Electronically Signed   By: Sandi Mariscal M.D.   On: 05/15/2020 08:27   IR PERCUTANEOUS ART THROMBECTOMY/INFUSION INTRACRANIAL INC DIAG ANGIO  Result Date: 05/13/2020 INDICATION: 70 year old female with past medical history significant for atrial fibrillation and CHF. She was last known well at 10  a.m. on 05/12/2020. At 11:30 a.m., she was noted to have facial droop and slurred speech. She was brought by EMS to ED. At presentation, she head left-sided hemiparesis with NIHSS 6. Head CT showed no evidence of a large territorial infarct or hemorrhage. CT angiogram showed an occlusion of the right ICA terminus extending into the proximal M1 and A1. IV tPA bolus was administered at 13:15. She was then taken to our service for a mechanical thrombectomy. EXAM: ULTRASOUND-GUIDED VASCULAR ACCESS DIAGNOSTIC CEREBRAL ANGIOGRAM MECHANICAL THROMBECTOMY FLAT PANEL HEAD CT COMPARISON:  CT/CT angiogram of the head and neck May 12, 2020 MEDICATIONS: Refer to anesthesia documentation ANESTHESIA/SEDATION: The procedure was performed in the general anesthesia. CONTRAST:  50 mL of Omnipaque 300. FLUOROSCOPY TIME:  Fluoroscopy Time: 8 minutes (233 mGy). COMPLICATIONS: None immediate. TECHNIQUE: Informed written consent was obtained from the patient's daughter after a thorough discussion of the procedural risks, benefits and alternatives. All questions were addressed. Maximal Sterile Barrier Technique was utilized including caps, mask, sterile gowns, sterile gloves, sterile drape, hand hygiene and skin antiseptic. A timeout was performed prior to the initiation of the procedure. The right groin was prepped and draped in the usual sterile fashion. Using a micropuncture kit and  the modified Seldinger technique, access was gained to the right common femoral artery and an 8 French sheath was placed. Real-time ultrasound guidance was utilized for vascular access including the acquisition of a permanent ultrasound image documenting patency of the accessed vessel. Under fluoroscopy, a Zoom 88 guide catheter was navigated over a 6 [place] Berenstein 2 catheter and a 0.035" Terumo Glidewire into the aortic arch. The catheter was placed into the right common carotid artery. Frontal and lateral angiograms of the head and neck were obtained. FINDINGS: Patent right common femoral artery. There is no occlusion of the intracranial right internal carotid just distal to the origin of the right posterior communicating artery. Minimal distal contrast penetration is seen. PROCEDURE: Under biplane roadmap, the catheter construct was advanced over the Glidewire into the cervical right internal carotid artery. The Berenstein catheter was removed. Under fluoroscopy, a zoom 71 aspiration catheter was navigated through the zoom 88 guide catheter and over an Aristotle 18 micro guidewire into the supraclinoid right ICA. The zoom 71 catheter was connected to a penumbra aspiration pump in continuous aspiration was performed for approximately 1 minute. Continuous aspiration was also performed concomitantly in the zoom 88 catheter with a 60 cc vacuum pressure syringe. The aspiration catheter was removed under constant aspiration. The guiding catheter was aspirated until clear blood was obtained. Right internal carotid artery angiogram showed complete recanalization of the right ICA (TICI3). Delay right ICA angiograms showed persistent patency of the right ICA, right MCA ACA vascular trees. Flat panel CT of the head was obtained and post processed in a separate workstation with concurrent attending physician supervision. Selected images were sent to PACS. No evidence of hemorrhagic complication. A right common femoral  artery angiogram was obtained with frontal view. The access is at the level of the right common femoral artery which has adequate caliber for closure device. The femoral sheath was exchanged over the wire for a Perclose prostyle which was utilized for access closure. Immediate hemostasis was achieved. Patient became hypotensive while awaiting COVID test result for extubation, after intervention was concluded and eventually progressed to PEA arrest. ROSC obtained after 2 rounds of chest compressions. Patient stabilized and transferred to ICU. Please see anesthesia documentation for further details. Family updated by telephone. IMPRESSION: 1. Successful  mechanical thrombectomy for treatment of a right ICA terminus occlusion with direct contact aspiration. Total of 1 pass with complete recanalization (TICI 3). 2. No thromboembolic or hemorrhagic complication. 3. Postprocedural PEA cardiac arrest with ROSC obtained after 2 rounds of chest compression. Please see anesthesia documentation for further details. PLAN: 1. Transferd to ICU for further care. 2. Follow-up head CT/MRI within 24 hours. Electronically Signed   By: Pedro Earls M.D.   On: 05/13/2020 10:33   CT HEAD CODE STROKE WO CONTRAST  Result Date: 05/12/2020 CLINICAL DATA:  Code stroke. Neuro deficit, acute, stroke suspected. Additional history provided: Left-sided weakness. EXAM: CT HEAD WITHOUT CONTRAST TECHNIQUE: Contiguous axial images were obtained from the base of the skull through the vertex without intravenous contrast. COMPARISON:  Prior head CT examinations 05/10/2010. FINDINGS: Brain: Cerebral volume is normal. There is no acute intracranial hemorrhage. No demarcated cortical infarct. No extra-axial fluid collection. No evidence of intracranial mass. No midline shift. Vascular: No hyperdense vessel.  Atherosclerotic calcifications Skull: Normal. Negative for fracture or focal lesion. Sinuses/Orbits: Redemonstrated bilateral  proptosis. Rightward gaze. No significant paranasal sinus disease. ASPECTS [facility] Stroke Program Early CT Score) - Ganglionic level infarction (caudate, lentiform nuclei, internal capsule, insula, M1-M3 cortex): 7 - Supraganglionic infarction (M4-M6 cortex): 3 Total score (0-10 with 10 being normal): 10 These results were communicated to Dr. Cheral Marker at 12:40 pmon 5/2/2022by text page via the [facility] messaging system. IMPRESSION: No evidence of acute intracranial abnormality.  ASPECTS is 10. Redemonstrated bilateral proptosis. Electronically Signed   By: Kellie Simmering DO   On: 05/12/2020 12:41   CT ANGIO HEAD CODE STROKE  Result Date: 05/12/2020 CLINICAL DATA:  Left-sided weakness EXAM: CT ANGIOGRAPHY HEAD AND NECK TECHNIQUE: Multidetector CT imaging of the head and neck was performed using the standard protocol during bolus administration of intravenous contrast. Multiplanar CT image reconstructions and MIPs were obtained to evaluate the vascular anatomy. Carotid stenosis measurements (when applicable) are obtained utilizing NASCET criteria, using the distal internal carotid diameter as the denominator. CONTRAST:  39mL OMNIPAQUE IOHEXOL 350 MG/ML SOLN COMPARISON:  Noncontrast head CT performed earlier today 05/12/2020. FINDINGS: CTA NECK FINDINGS Aortic arch: Standard aortic branching. No hemodynamically significant innominate or proximal subclavian artery stenosis. Right carotid system: CCA and ICA patent within the neck without significant stenosis (50% or greater). Mild soft and calcified plaque within the carotid bifurcation and proximal ICA. Asymmetrically diminished enhancement of the cervical right ICA, likely due to downstream occlusion. Left carotid system: CCA and ICA patent within the neck without significant stenosis (50% or greater). Mild soft and calcified plaque within the carotid bifurcation and proximal ICA. Vertebral arteries: Patent within the neck without stenosis. Duplicated proximal left  vertebral artery. Skeleton: No acute bony abnormality or aggressive osseous lesion. Cervical spondylosis. Other neck: Asymmetric prominence of the right thyroid lobe. Underlying right thyroid lobe nodule measuring at least 2.8 cm. Upper chest: Partially imaged left pleural effusion. Review of the MIP images confirms the above findings CTA HEAD FINDINGS Anterior circulation: Asymmetrically diminished enhancement and caliber of the right ICA siphon, likely secondary to downstream occlusion. Absent enhancement within the right ICA terminus, proximal M1 right MCA and proximal A1 right ACA. Findings are compatible with T-shaped occlusion secondary to thrombus. There is some reconstitution of enhancement more distally within the M1 right MCA and within right M2 vessels. There is reconstitution of enhancement within the distal A1 right ACA. The intracranial left ICA is patent. No left M2 proximal branch occlusion or high-grade  proximal stenosis is identified. Developmentally hypoplastic A1 left ACA. The A2 and more distal anterior cerebral arteries are patent. No intracranial aneurysm is identified. Posterior circulation: The intracranial vertebral arteries are patent. The basilar artery is patent. The posterior cerebral arteries are patent. A right posterior communicating artery is present. The left posterior communicating artery is hypoplastic or absent. Venous sinuses: Within the limitations of contrast timing, no convincing thrombus. Anatomic variants: As described Review of the MIP images confirms the above findings Critical/emergent results were called by telephone at the time of interpretation on 05/12/2020 at 12:53 pm to provider [facility] , who verbally acknowledged these results. IMPRESSION: CTA neck: 1. The common carotid, internal carotid and vertebral arteries are patent within the neck without hemodynamically significant stenosis. Mild atherosclerotic disease within the carotid bifurcations and proximal  ICAs. 2. Asymmetrically diminished enhancement of the cervical right ICA, likely secondary to downstream occlusion. 3. 2.8 cm right thyroid lobe nodule. Non-emergent thyroid ultrasound is recommended for further evaluation. CTA head: T-shaped large vessel occlusion involving the right ICA terminus, proximal M1 right middle cerebral artery and proximal A1 right anterior cerebral artery. Electronically Signed   By: Kellie Simmering DO   On: 05/12/2020 13:13    Labs:  CBC: Recent Labs    05/25/20 0454 05/26/20 0433 06/06/20 2137 06/07/20 0823  WBC 21.8* 17.1* 12.9* 11.9*  HGB 8.3* 8.3* 9.3* 9.1*  HCT 25.6* 26.3* 30.5* 29.8*  PLT 312 313 493* 481*    COAGS: Recent Labs    07/31/19 0255 05/12/20 1220 05/14/20 0507 05/19/20 1045  INR 1.3* 1.3*  --  1.3*  APTT  --  26 92*  --     BMP: Recent Labs    08/31/19 0948 11/09/19 1201 02/03/20 1445 03/03/20 1559 03/05/20 1524 03/18/20 1645 05/25/20 0454 05/26/20 0433 06/06/20 2137 06/07/20 0823  NA 142 145*   < > 138 139   < > 127* 127* 138 137  K 4.4 4.5   < > 5.1 4.8   < > 5.2* 4.5 4.7 4.8  CL 102 105   < > 104 102   < > 84* 83* 98 100  CO2 28 27   < > 21 24   < > 33* 33* 37* 33*  GLUCOSE 100* 139*   < > 161* 197*   < > 170* 198* 197* 178*  BUN 13 13   < > 16 17   < > 32* 31* 28* 27*  CALCIUM 9.7 9.6   < > 9.1 8.9   < > 9.2 8.9 8.9 8.4*  CREATININE 0.77 0.80   < > 0.96 1.08*   < > 1.44* 1.35* 0.80 0.85  GFRNONAA 80 77   < > 61 53*   < > 40* 43* >60 >60  GFRAA 92 88  --  70 61  --   --   --   --   --    < > = values in this interval not displayed.    LIVER FUNCTION TESTS: Recent Labs    05/20/20 0441 05/23/20 0155 06/06/20 2137 06/07/20 0823  BILITOT 1.3* 1.1 0.5 0.9  AST 37 24 66* 47*  ALT 37 25 34 32  ALKPHOS 194* 151* 168* 155*  PROT 6.1* 6.6 7.5 7.2  ALBUMIN 2.1* 2.2* 2.2* 2.1*   Assessment and Plan:  70 y.o. female inpatient. History of DM, Cardiomyopathy, CHF, atrial arrhythmias with left ventricular  dysfunction and recent  CVA (4.49.67) complicated by PEA arrest during thrombectomy.  Presented to the ED at [facility] with right sided abdominal pain, nausea and vomiting. Found to have a left sided  Hemithorax though to be traumatic in nature from PEA arrest on 5.22.22. CT Chest from 5.28.22 reads Large complex multilocular left pleural effusion. This has increased in size since recent CT of the neck dated 05/12/2020. Differential includes empyema, sequela of previous hemothorax, or neoplasm. Correlation with cytology after thoracentesis may be useful. Team is requesting a left sided chest tube.   Patient is on eliquis. WBC is 11.9, AST 47, Albumin 2.1, BUN 27. All other labs and medications are within acceptable parameters. Allergies include. ASA, Tylenol, .  IR consulted for possible left sided chest tube placement. Case has been reviewed and procedure approved by Dr. Pascal Lux.  Patient tentatively scheduled for 5.28.22 with CT.  Team instructed to: Keep Patient to be NPO after midnight Hold prophylactic anticoagulation 24 hours prior to scheduled procedure.  IR will call patient when ready.  Risks and benefits discussed with the patient including bleeding, infection, damage to adjacent structures, bowel perforation/fistula connection, and sepsis.  All of the patient's questions were answered, patient is agreeable to proceed. Consent signed and in CT control room..   Thank you for this interesting consult.  I greatly enjoyed meeting [name] [name] and look forward to participating in their care.  A copy of this report was sent to the requesting provider on this date.  Electronically Signed: Jacqualine Mau, NP 06/07/2020, 10:43 AM   I spent a total of 40 Minutes    in face to face in clinical consultation, greater than 50% of which was counseling/coordinating care for left sided chest tube placement

## 2020-06-07 NOTE — Consult Note (Signed)
Cardiology Consultation:   Patient ID: CHRYSA RAMPY MRN: [id number]; DOB: 05-03-1950  Admit date: 06/06/2020 Date of Consult: 06/07/2020  PCP:  Dana Allan, MD   [facility] Providers Cardiologist:  Thomasene Ripple, DO         Patient Profile:   [name] [name] is a 70 y.o. female with a hx of cardiomyopathy presumed nonischemic, congestive heart failure in the setting of recurrent atrial arrhythmias who is being seen 06/07/2020 for the evaluation of heart failure and chest pain at the request of Dr. Janee Morn.  History of Present Illness:   Ms. Pick recently discharged from: With a history as outlined below presenting now from the nursing facility with complaints of right-sided abdominal pain nausea and vomiting.  She complains of pain in her chest with breathing and talking as well as turning of her head, laughing or pressing on her chest.  She is also short of breath.  chest imaging showed opacification of the left hemithorax and CT an enlarging multilocular left pleural effusion.  She has been started on broad-spectrum antibiotics.  She has had no fevers but some chills and tremors question rigors  History of recurrent atrial arrhythmias in the setting of left ventricular dysfunction [facility] concerning for an infiltrative cardiomyopathy with a recent hospitalization (5/22) for a stroke associated with a acute occlusion of the right ICA for which she was treated with tPA and thrombectomy, the procedure complicated by a PEA arrest with successful resuscitation but residual shock.  Echocardiogram an EF of less than 20%, severe right ventricular dysfunction, thought potentially related to a tachycardia induced cardiomyopathy with a large circumferential effusion without tamponade.  Underwent TEE guided cardioversion for atrial tachycardia with Eliquis and amiodarone.  Hyperkalemia precluded spironolactone and orthostatic hypotension other guideline directed therapy  Ejection  fraction 7/21 with 40-45% at that time thought also be secondary to tachycardia.  Interval hospitalization 1/22 demonstrated intermediate LVEF at 25-30% also with moderate RV dysfunction    History of atrial arrhythmias with rapid rates but having been intolerant of AV nodal blocking agents because of bradycardia.  Has declined procedures and been poorly compliant with medications in the past in the context of presumed mental illness noted in the old chart as schizophrenia/multiple personality disorder/bipolar disorder.    Past Medical History:  Diagnosis Date  . Abdominal pain   . Atrial flutter (HCC)   . Back pain   . Chronic combined systolic and diastolic CHF (congestive heart failure) (HCC) 02/04/2020  . Constipation   . Diabetes mellitus without complication (HCC)   . Fatigue   . Full dentures   . Hemorrhoids   . History of noncompliance with medical treatment, presenting hazards to health   . Hypertension   . Lack of adequate sleep   . Liver mass   . PAF (paroxysmal atrial fibrillation) (HCC)   . Rash    on right breast  . Wears glasses     Past Surgical History:  Procedure Laterality Date  . BREAST DUCTAL SYSTEM EXCISION    . BREAST EXCISIONAL BIOPSY Left   . CARDIOVERSION N/A 07/31/2019   Procedure: CARDIOVERSION;  Surgeon: Chrystie Nose, MD;  Location: [facility] ENDOSCOPY;  Service: Cardiovascular;  Laterality: N/A;  . CARDIOVERSION N/A 05/19/2020   Procedure: CARDIOVERSION;  Surgeon: Dolores Patty, MD;  Location: [facility] ENDOSCOPY;  Service: Cardiovascular;  Laterality: N/A;  . CHOLECYSTECTOMY    . CHOLECYSTECTOMY, LAPAROSCOPIC  07/24/2010  . ESOPHAGEAL DILATION    . IR CT HEAD LTD  05/12/2020  . IR PERCUTANEOUS ART THROMBECTOMY/INFUSION INTRACRANIAL INC DIAG ANGIO  05/12/2020      . IR PERCUTANEOUS ART THROMBECTOMY/INFUSION INTRACRANIAL INC DIAG ANGIO  05/12/2020  . IR US GUIDE VASC ACCESS RIGHT  05/12/2020  . RADIOLOGY WITH ANESTHESIA N/A 05/12/2020   Procedure: IR WITH  ANESTHESIA;  Surgeon: Radiologist, Medication, MD;  Location: [facility] OR;  Service: Radiology;  Laterality: N/A;  . TEE WITHOUT CARDIOVERSION N/A 07/31/2019   Procedure: TRANSESOPHAGEAL ECHOCARDIOGRAM (TEE);  Surgeon: Chrystie Nose, MD;  Location: [facility] ENDOSCOPY;  Service: Cardiovascular;  Laterality: N/A;  . TEE WITHOUT CARDIOVERSION N/A 05/19/2020   Procedure: TRANSESOPHAGEAL ECHOCARDIOGRAM (TEE);  Surgeon: Dolores Patty, MD;  Location: [facility] ENDOSCOPY;  Service: Cardiovascular;  Laterality: N/A;       Inpatient Medications: Scheduled Meds: . amiodarone  200 mg Oral BID  . amLODipine  5 mg Oral Daily  . apixaban  5 mg Oral BID  . atorvastatin  80 mg Oral Daily  . digoxin  0.125 mg Oral Daily  . ferrous gluconate  324 mg Oral Daily  . hydrALAZINE  10 mg Oral Q8H  . insulin aspart  0-5 Units Subcutaneous QHS  . insulin aspart  0-9 Units Subcutaneous TID WC  . insulin glargine  15 Units Subcutaneous Daily  . isosorbide mononitrate  15 mg Oral BID  . magnesium oxide  400 mg Oral Daily  . scopolamine  1 patch Transdermal Q72H   Continuous Infusions: . ceFEPime (MAXIPIME) IV 2 g (06/07/20 0909)  . metronidazole 500 mg (06/07/20 0910)  . [START ON 06/08/2020] vancomycin     PRN Meds: fentaNYL (SUBLIMAZE) injection  Allergies:    Allergies  Allergen Reactions  . Amiodarone Shortness Of Breath and Other (See Comments)    Soreness, wheezing, and weakness also  . Fruit & Vegetable Daily [Nutritional Supplements] Shortness Of Breath, Swelling and Other (See Comments)    All fruits cause tongue swelling and numbness Organic fruits are okay to eat  . Metoprolol Shortness Of Breath, Other (See Comments) and Cough    Soreness, wheezing, and weakness also  . Other Shortness Of Breath, Swelling and Other (See Comments)    Tree nuts cause tongue swelling and numbness  . Peanut-Containing Drug Products Shortness Of Breath, Swelling and Other (See Comments)    Tongue swelling and numbness  .  Aspirin Nausea And Vomiting and Other (See Comments)    Cannot take due to liver issues  . Tylenol [Acetaminophen] Other (See Comments)    Cannot take due to liver issues- tolerating 650 mg tablets in 2022  . Beef-Derived Products Other (See Comments)    Limited, per patient  . Lactose Intolerance (Gi) Diarrhea and Nausea And Vomiting    Social History:   Social History   Socioeconomic History  . Marital status: Single    Spouse name: Not on file  . Number of children: 3  . Years of education: Not on file  . Highest education level: High school graduate  Occupational History  . Occupation: Retired    Associate Professor: OTHER    Comment: CNA  Tobacco Use  . Smoking status: Never Smoker  . Smokeless tobacco: Never Used  Vaping Use  . Vaping Use: Never used  Substance and Sexual Activity  . Alcohol use: No    Alcohol/week: 0.0 standard drinks  . Drug use: No  . Sexual activity: Not Currently  Other Topics Concern  . Not on file  Social History Narrative   Patient lives alone in  an apartment complex for seniors.   Patient does not drive. She walks to surrounding places near her complex or her children run errands for her. Patient never goes without.   Patient enjoys sewing, crafts, shopping, and walking.    Patient has 3 children and she is close with all of them.        Social Determinants of Health   Financial Resource Strain: Low Risk   . Difficulty of Paying Living Expenses: Not very hard  Food Insecurity: No Food Insecurity  . Worried About Programme researcher, broadcasting/film/video in the Last Year: Never true  . Ran Out of Food in the Last Year: Never true  Transportation Needs: Unmet Transportation Needs  . Lack of Transportation (Medical): Yes  . Lack of Transportation (Non-Medical): Yes  Physical Activity: Not on file  Stress: Not on file  Social Connections: Not on file  Intimate Partner Violence: Not on file    Family History:     Family History  Problem Relation Age of Onset  .  Stroke Mother   . Other Father        broken heart after spouse spouse  . Diabetes Sister   . Hypertension Sister   . Diabetes Sister   . Multiple sclerosis Sister   . Alcohol abuse Other   . Diabetes Other   . Breast cancer Neg Hx      ROS:  Please see the history of present illness.    All other ROS reviewed and negative.     Physical Exam/Data:   Vitals:   06/06/20 2328 06/07/20 0030 06/07/20 0430 06/07/20 0701  BP: (!) 151/67 (!) 158/65 (!) 155/68 (!) 181/78  Pulse: 74 73 70 79  Resp: 20 18 (!) 26 (!) 26  Temp:    99 F (37.2 C)  TempSrc:    Oral  SpO2: 100% 99% 99% 100%   No intake or output data in the 24 hours ending 06/07/20 0924 Last 3 Weights 05/26/2020 05/23/2020 05/22/2020  Weight (lbs) 187 lb 9.8 oz 188 lb 0.8 oz 188 lb 0.8 oz  Weight (kg) 85.1 kg 85.3 kg 85.3 kg       General:  Well nourished, well developed, in no acute distress  HEENT: normal Lymph: no adenopathy Neck: 7-8 cm Endocrine:  No thryomegaly Vascular: No carotid bruits; FA pulses 2+ bilaterally without bruits  Cardiac:  normal S1, S2; RRR; no murmur   Chest wall tenderness to minimum touch on the sternum and only lateralizing ribs Lungs:  clear to auscultation on the right with egophony on the left  abd: soft, diffuse tenderness right greater than left Ext: no edema Musculoskeletal:  No deformities, BUE and BLE strength normal and equal Skin: warm and dry  Neuro:  CNs 2-12 intact, no focal abnormalities noted Psych: Intermittently tearful  EKG:  The EKG was personally reviewed and demonstrates: Sinus with diffuse ST-T changes.  No different over the last 12 hours  Telemetry:  Telemetry was personally reviewed and demonstrates: Sinus  Relevant CV Studies:    Laboratory Data:  High Sensitivity Troponin:   Recent Labs  Lab 05/12/20 1550 06/07/20 0735  TROPONINIHS 99* 35*     Chemistry Recent Labs  Lab 06/06/20 2137 06/07/20 0823  NA 138 137  K 4.7 4.8  CL 98 100  CO2 37*  33*  GLUCOSE 197* 178*  BUN 28* 27*  CREATININE 0.80 0.85  CALCIUM 8.9 8.4*  GFRNONAA >60 >60  ANIONGAP 3* 4*  Recent Labs  Lab 06/06/20 2137 06/07/20 0823  PROT 7.5 7.2  ALBUMIN 2.2* 2.1*  AST 66* 47*  ALT 34 32  ALKPHOS 168* 155*  BILITOT 0.5 0.9   Hematology Recent Labs  Lab 06/06/20 2137 06/07/20 0823  WBC 12.9* 11.9*  RBC 3.71* 3.63*  HGB 9.3* 9.1*  HCT 30.5* 29.8*  MCV 82.2 82.1  MCH 25.1* 25.1*  MCHC 30.5 30.5  RDW 18.6* 18.6*  PLT 493* 481*   BNP Recent Labs  Lab 06/07/20 0735  BNP 707.6*    DDimer No results for input(s): DDIMER in the last 168 hours.   Radiology/Studies:  CT Abdomen Pelvis Wo Contrast  Result Date: 06/06/2020 CLINICAL DATA:  Right lower quadrant abdominal pain, nausea EXAM: CT ABDOMEN AND PELVIS WITHOUT CONTRAST TECHNIQUE: Multidetector CT imaging of the abdomen and pelvis was performed following the standard protocol without IV contrast. COMPARISON:  05/13/2020, 05/12/2020, 07/20/2015 FINDINGS: Lower chest: There is a large multiloculated heterogeneous left pleural effusion, with areas of higher attenuation seen anteriorly and posteriorly. There is complete atelectasis of the visualized left lung. Right lung is clear. The heart is unremarkable. Unenhanced CT was performed per clinician order. Lack of IV contrast limits sensitivity and specificity, especially for evaluation of abdominal/pelvic solid viscera. Hepatobiliary: No focal liver abnormality is seen. Status post cholecystectomy. No biliary dilatation. Pancreas: Unremarkable. No pancreatic ductal dilatation or surrounding inflammatory changes. Spleen: Indeterminate 1.3 cm hypodensity is seen within the posterior aspect of the spleen, image 37/2. Spleen is normal in size. Adrenals/Urinary Tract: No urinary tract calculi or obstructive uropathy within either kidney. The adrenals and bladder are unremarkable. Stomach/Bowel: No bowel obstruction or ileus. Normal appendix right lower  quadrant. No bowel wall thickening or inflammatory change. Vascular/Lymphatic: Aortic atherosclerosis. No enlarged abdominal or pelvic lymph nodes. Reproductive: Status post hysterectomy. No adnexal masses. Other: No free fluid or free gas.  No abdominal wall hernia. Musculoskeletal: There are subacute healing right anterolateral fourth and fifth rib fractures with minimal callus formation. No acute bony abnormalities. Reconstructed images demonstrate no additional findings. IMPRESSION: 1. Large complex loculated left pleural effusion, with complete atelectasis of the visualized left lower lobe. Differential diagnosis would include empyema or sequela of hemothorax. Dedicated CT of the chest may be useful to visualize this abnormality in its entirety. 2. No acute intra-abdominal or intrapelvic process. Normal appendix right lower quadrant. 3. Indeterminate hypodensity posterior aspect of the spleen, which could reflect a small cyst or hemangioma. 4.  Aortic Atherosclerosis (ICD10-I70.0). Electronically Signed   By: Sharlet SalinaMichael  Brown M.D.   On: 06/06/2020 23:02   DG Chest 2 View  Result Date: 06/06/2020 CLINICAL DATA:  Large left effusion on recent CT of the abdomen EXAM: CHEST - 2 VIEW COMPARISON:  05/13/2020 FINDINGS: Delete opacification of the left hemithorax is noted. Cardiac shadow is stable. Right lung is clear. No acute bony abnormality is noted. IMPRESSION: Opacification of the left hemithorax consistent with the complex diffusion seen on recent CT of the abdomen. Electronically Signed   By: Alcide CleverMark  Lukens M.D.   On: 06/06/2020 23:22   CT Chest Wo Contrast  Result Date: 06/07/2020 CLINICAL DATA:  Left pleural effusion, pneumonia, abnormal chest x-ray and abdominal CT EXAM: CT CHEST WITHOUT CONTRAST TECHNIQUE: Multidetector CT imaging of the chest was performed following the standard protocol without IV contrast. COMPARISON:  04/06/2010, 06/06/2020 FINDINGS: Cardiovascular: Unenhanced imaging of the heart  and great vessels demonstrates no pericardial effusion. There is normal caliber of the thoracic aorta. Mild atherosclerosis of  the aorta and coronary vessels. Mediastinum/Nodes: Chronic heterogeneous enlargement of the right lobe of the thyroid has been unchanged since 2012, compatible with goiter. This is been previously evaluated by ultrasound. Trachea and esophagus are unremarkable. Subcentimeter mediastinal adenopathy identified, largest measuring 8 mm in the AP window. Lungs/Pleura: There is complete atelectasis of the left lung. Heterogeneous multilocular left pleural effusion is identified, occupying the entire left hemithorax. Areas of higher attenuation are seen within the fluid collection anteriorly and posteriorly at the level of the hilum. Scattered hypoventilatory changes are seen within the right lung. Otherwise the right hemithorax is clear. Central airways are patent. Upper Abdomen: No acute abnormality. Musculoskeletal: No acute or destructive bony lesions. Reconstructed images demonstrate no additional findings. IMPRESSION: 1. Large complex multilocular left pleural effusion. This has increased in size since recent CT of the neck dated 05/12/2020. Differential includes empyema, sequela of previous hemothorax, or neoplasm. Correlation with cytology after thoracentesis may be useful. 2. Complete left lung atelectasis. 3. Stable heterogeneous enlargement right lobe thyroid compatible with goiter. This has been evaluated on previous imaging. Please refer to thyroid ultrasound dated 05/14/2020. (Ref: J Am Coll Radiol. 2015 Feb;12(2): 143-50). 4.  Aortic Atherosclerosis (ICD10-I70.0). Electronically Signed   By: Sharlet Salina M.D.   On: 06/07/2020 00:31     Assessment and Plan:   Complex left-sided pleural effusion  Right greater than left-sided abdominal pain  Shortness of breath  Atypical chest pain  Cardiomyopathy presumed nonischemic.  Possibly rate related  Atrial  arrhythmias-recurrent holding sinus rhythm  Amiodarone for the above  Bipolar/schizophrenia  Hyper kalemia precluding guideline directed therapy  The patient presents with abdominal pain evaluation of which is ongoing.  She was found to have a large opacifying left pleural effusion for which thoracentesis is anticipated.  Eliquis should be resumed as soon as possible thereafter as she has undergone recent cardioversion  She is maintaining sinus rhythm on amiodarone and would continue it.  QT prolongation is normal on amiodarone  Her chest pain syndrome is noncardiac.  Her troponins are flat her chest wall is tender I wonder whether there is a pleuritic component to this related to her effusion.  With her blood pressure better, will increase her hydralazine and isosorbide; will anticipate reinitiation of carvedilol.       For questions or updates, please contact [facility] Please consult [email or web address] for contact info under    Signed, Sherryl Manges, MD  06/07/2020 9:24 AM

## 2020-06-07 NOTE — Progress Notes (Signed)
I have seen and assessed patient and agree with Dr. Gardiner Rhyme assessment and plan.  Patient 70 year old female history of chronic CHF with a EF <20%, paroxysmal A. fib on chronic anticoagulation, hypertension, hyperlipidemia, type 2 diabetes, recently admitted to the hospital for acute embolic stroke requiring tPA and mechanical thrombectomy complicated by cardiogenic shock/PEA cardiac arrest immediately following thrombectomy requiring milrinone infusion and diuretics subsequently extubated on 5/3 and weaned off vasopressors.  Patient subsequently underwent TEE/DCCV on 05/19/2020 and subsequently discharged to [facility] on 05/26/2020.  Patient presented to the ED from nursing facility with right-sided abdominal pain, nausea vomiting, shortness of breath, midsternal chest pain.  Work-up in the ED with CT chest concerning for large left-sided loculated pleural effusion which had increased in size since recent CT of the neck 05/12/2020 with concerns for empyema, sequelae of previous pneumothorax, neoplasm.  Patient admitted placed on broad-spectrum antibiotics of vancomycin, cefepime, Zosyn.  Order placed for IR for diagnostic and therapeutic ultrasound-guided thoracentesis.  Consulted and spoke with pulmonary this morning who spoke with IR and requested pigtail catheter/chest to be placed.  Patient underwent successful CT-guided placement of 14 French drainage catheter into the left pleural space yielding 950 cc of bloody appearing fluid.  Due to bloody appearing fluid we will discontinue patient's Eliquis for now.  Cardiology also consulted for management of chronic complex cardiac issues as well as evaluation of midsternal chest pain.  No charge.

## 2020-06-07 NOTE — Consult Note (Signed)
NAME:  [name] [name], MRN:  [id number], DOB:  09/21/1950, LOS: 0 ADMISSION DATE:  06/06/2020, CONSULTATION DATE:  06/07/20 REFERRING MD:  Irine Seal, MD CHIEF COMPLAINT:  Pleural effusion  History of Present Illness:  [name] [name] is a 70 year old woman with CHF, paroxysmal atrial fibrillation, and DMII who was recently admitted 5/2 to 2/40 for acute embolic stroke due to PAG s/p TPA therapy and mechanical thrombectomy. Her course was complicated by cardiac arrest and cardiogenic shock. She required milrinone drip and cardioversion for the atrial fibrillation. She has been eliquis for anticoagulation after the stroke. She was sent to [facility] place on 5/16 where she presented from with right sided abdominal pain, nausea and vomiting. She also complained of some shortness of breath and has been on 3L of O2 since being at [place].   Work up in the ED revealed left hemithorax opacification based on chest radiograph and CT chest showed loculated pleural effusion involving the left hemithorax.   IR was consulted for chest tube placement and a 42fr pigtail catheter was placed via CT guidance with 954mL of bloody appearing fluid removed initially.   PCCM was consulted for further evaluation and treatment of the left pleural effusion.   Pleural fluid studies show LDH 1636, total protein 3.1, glucose 134, albumin 1.5. Cell count is pending at this time.   Pertinent  Medical History  Atrial Fibrillation Heart Failure DMII  Significant Hospital Events: Including procedures, antibiotic start and stop dates in addition to other pertinent events   . 5/28 admitted to [facility], 48fr chest tube placed by IR  Interim History / Subjective:   Patient tolerated chest tube placement well. She continues to complain of centrally located abdominal pain that is sharp and constant. She reports not having a bowel movement in days despite receiving a bowel regimen.   Objective   Blood pressure (!) 207/78, pulse  84, temperature 99 F (37.2 C), temperature source Oral, resp. rate (!) 32, SpO2 96 %.       No intake or output data in the 24 hours ending 06/07/20 1110 There were no vitals filed for this visit.  Examination: General: Elderly woman, no acute distress HENT: [place]/AT, moist mucous membranes, sclera anicteric Lungs: Diminished breath sounds on the left.  Clear to auscultation on the right.  Left pigtail catheter in place with bloody drainage. Cardiovascular: Regular rate and rhythm, no murmurs Abdomen: Soft, diffusely tender to light palpation, bowel sounds present Extremities: Warm, no edema Neuro: Alert, oriented x3, moving all extremities. GU: Deferred  Labs/imaging that I have personally reviewed  (right click and "Reselect all SmartList Selections" daily)  5/27 CT Abdomen 5/28 CT Chest  BNP 707, troponin 35, LDH 496, total protein 7.2, creatinine 0.85 CBC: WBC 11.9, hemoglobin 9.1 (Hgb on 05/13/20 was 12)  Resolved Hospital Problem list     Assessment & Plan:   Left complicated pleural effusion Based on review of chest imaging the pleural effusion is new since 01/2020 and first noted on chest radiograph 05/13/20.  Pleural fluid studies indicate exudative process based on LDH ratio. The etiology of the pleural effusion includes parapneumonic effusion vs empyema vs hemothorax s/p resuscitation.  - f/u remaining pleural fluid studies which include cell count, cytology and cultures. - Will repeat chest radiograph tomorrow morning and consider TPA/DNAse therapy if loculations remain present.  - Hold eliquis therapy for now and will discuss starting heparin drip tomorrow for anticoagulation needs.  - Continue chest tube to -20 cmH2O -  agree with broad spectrum antibiotics for now   PCCM will continue to follow.   Labs   CBC: Recent Labs  Lab 06/06/20 2137 06/07/20 0823  WBC 12.9* 11.9*  NEUTROABS 10.7* 10.0*  HGB 9.3* 9.1*  HCT 30.5* 29.8*  MCV 82.2 82.1  PLT 493* 481*     Basic Metabolic Panel: Recent Labs  Lab 06/06/20 2137 06/07/20 0735 06/07/20 0823  NA 138  --  137  K 4.7  --  4.8  CL 98  --  100  CO2 37*  --  33*  GLUCOSE 197*  --  178*  BUN 28*  --  27*  CREATININE 0.80  --  0.85  CALCIUM 8.9  --  8.4*  MG  --  2.1 1.9   GFR: Estimated Creatinine Clearance: 65.5 mL/min (by C-G formula based on SCr of 0.85 mg/dL). Recent Labs  Lab 06/06/20 2137 06/07/20 0823  WBC 12.9* 11.9*    Liver Function Tests: Recent Labs  Lab 06/06/20 2137 06/07/20 0823  AST 66* 47*  ALT 34 32  ALKPHOS 168* 155*  BILITOT 0.5 0.9  PROT 7.5 7.2  ALBUMIN 2.2* 2.1*   Recent Labs  Lab 06/06/20 2137  LIPASE 35   No results for input(s): AMMONIA in the last 168 hours.  ABG    Component Value Date/Time   PHART 7.401 05/12/2020 1602   PCO2ART 37.6 05/12/2020 1602   PO2ART 349 (H) 05/12/2020 1602   HCO3 23.5 05/12/2020 1602   TCO2 25 05/12/2020 1602   ACIDBASEDEF 1.0 05/12/2020 1602   O2SAT 73.7 05/20/2020 0520     Coagulation Profile: No results for input(s): INR, PROTIME in the last 168 hours.  Cardiac Enzymes: No results for input(s): CKTOTAL, CKMB, CKMBINDEX, TROPONINI in the last 168 hours.  HbA1C: HbA1c, POC (controlled diabetic range)  Date/Time Value Ref Range Status  12/30/2017 10:02 AM 9.4 (A) 0.0 - 7.0 % Final  09/06/2017 01:31 PM 8.8 (A) 0.0 - 7.0 % Final   HbA1c POC (<> result, manual entry)  Date/Time Value Ref Range Status  07/26/2019 11:08 AM >15.0 4.0 - 5.6 % Final   Hgb A1c MFr Bld  Date/Time Value Ref Range Status  05/13/2020 05:27 AM 10.5 (H) 4.8 - 5.6 % Final    Comment:    (NOTE) Pre diabetes:          5.7%-6.4%  Diabetes:              >6.4%  Glycemic control for   <7.0% adults with diabetes   02/04/2020 06:57 AM 10.4 (H) 4.8 - 5.6 % Final    Comment:    (NOTE) Pre diabetes:          5.7%-6.4%  Diabetes:              >6.4%  Glycemic control for   <7.0% adults with diabetes     CBG: Recent  Labs  Lab 06/07/20 0805  GLUCAP 186*    Review of Systems:   A 10 point review of systems was performed and is otherwise negative unless stated above in the HPI.  Past Medical History:  She,  has a past medical history of Abdominal pain, Atrial flutter ([place]), Back pain, Chronic combined systolic and diastolic CHF (congestive heart failure) ([place]) (02/04/2020), Constipation, Diabetes mellitus without complication ([place]), Fatigue, Full dentures, Hemorrhoids, History of noncompliance with medical treatment, presenting hazards to health, Hypertension, Lack of adequate sleep, Liver mass, PAF (paroxysmal atrial fibrillation) ([place]), Rash, and Wears glasses.  Surgical History:   Past Surgical History:  Procedure Laterality Date  . BREAST DUCTAL SYSTEM EXCISION    . BREAST EXCISIONAL BIOPSY Left   . CARDIOVERSION N/A 07/31/2019   Procedure: CARDIOVERSION;  Surgeon: Pixie Casino, MD;  Location: [facility] ENDOSCOPY;  Service: Cardiovascular;  Laterality: N/A;  . CARDIOVERSION N/A 05/19/2020   Procedure: CARDIOVERSION;  Surgeon: Jolaine Artist, MD;  Location: [facility] ENDOSCOPY;  Service: Cardiovascular;  Laterality: N/A;  . CHOLECYSTECTOMY    . CHOLECYSTECTOMY, LAPAROSCOPIC  07/24/2010  . ESOPHAGEAL DILATION    . IR CT HEAD LTD  05/12/2020  . IR PERCUTANEOUS ART THROMBECTOMY/INFUSION INTRACRANIAL INC DIAG ANGIO  05/12/2020      . IR PERCUTANEOUS ART THROMBECTOMY/INFUSION INTRACRANIAL INC DIAG ANGIO  05/12/2020  . IR US GUIDE VASC ACCESS RIGHT  05/12/2020  . RADIOLOGY WITH ANESTHESIA N/A 05/12/2020   Procedure: IR WITH ANESTHESIA;  Surgeon: Radiologist, Medication, MD;  Location: [place];  Service: Radiology;  Laterality: N/A;  . TEE WITHOUT CARDIOVERSION N/A 07/31/2019   Procedure: TRANSESOPHAGEAL ECHOCARDIOGRAM (TEE);  Surgeon: Pixie Casino, MD;  Location: [place];  Service: Cardiovascular;  Laterality: N/A;  . TEE WITHOUT CARDIOVERSION N/A 05/19/2020   Procedure: TRANSESOPHAGEAL ECHOCARDIOGRAM (TEE);   Surgeon: Jolaine Artist, MD;  Location: [facility] ENDOSCOPY;  Service: Cardiovascular;  Laterality: N/A;     Social History:   reports that she has never smoked. She has never used smokeless tobacco. She reports that she does not drink alcohol and does not use drugs.   Family History:  Her family history includes Alcohol abuse in an other family member; Diabetes in her sister, sister and another family member; Hypertension in her sister; Multiple sclerosis in her sister; Other in her father; Stroke in her mother. There is no history of Breast cancer.   Allergies Allergies  Allergen Reactions  . Amiodarone Shortness Of Breath and Other (See Comments)    Soreness, wheezing, and weakness also  . Fruit & Vegetable Daily [Nutritional Supplements] Shortness Of Breath, Swelling and Other (See Comments)    All fruits cause tongue swelling and numbness Organic fruits are okay to eat  . Metoprolol Shortness Of Breath, Other (See Comments) and Cough    Soreness, wheezing, and weakness also  . Other Shortness Of Breath, Swelling and Other (See Comments)    Tree nuts cause tongue swelling and numbness  . Peanut-Containing Drug Products Shortness Of Breath, Swelling and Other (See Comments)    Tongue swelling and numbness  . Aspirin Nausea And Vomiting and Other (See Comments)    Cannot take due to liver issues  . Tylenol [Acetaminophen] Other (See Comments)    Cannot take due to liver issues- tolerating 650 mg tablets in 2022  . Beef-Derived Products Other (See Comments)    Limited, per patient  . Lactose Intolerance (Gi) Diarrhea and Nausea And Vomiting     Home Medications  Prior to Admission medications   Medication Sig Start Date End Date Taking? Authorizing Provider  amiodarone (PACERONE) 200 MG tablet Take 1 tablet (200 mg total) by mouth 2 (two) times daily. 05/26/20  Yes Bonnielee Haff, MD  amLODipine (NORVASC) 5 MG tablet Take 5 mg by mouth daily.   Yes [provider]   atorvastatin (LIPITOR) 80 MG tablet Take 1 tablet (80 mg total) by mouth daily. 05/27/20  Yes Bonnielee Haff, MD  bisacodyl (DULCOLAX) 10 MG suppository Place 1 suppository (10 mg total) rectally daily as needed for moderate constipation. 05/26/20  Yes Bonnielee Haff, MD  digoxin (LANOXIN) 0.125 MG tablet Take 1 tablet (0.125 mg total) by mouth daily. 05/27/20  Yes Bonnielee Haff, MD  ELIQUIS 5 MG TABS tablet TAKE 1 TABLET(5 MG) BY MOUTH TWICE DAILY Patient taking differently: Take 5 mg by mouth 2 (two) times daily. 04/10/20  Yes Tobb, Kardie, DO  feeding supplement, GLUCERNA SHAKE, (GLUCERNA SHAKE) LIQD Take 237 mLs by mouth 3 (three) times daily between meals.   Yes [provider]  ferrous gluconate (FERGON) 324 MG tablet Take 324 mg by mouth daily.   Yes [provider]  hydrALAZINE (APRESOLINE) 10 MG tablet Take 1 tablet (10 mg total) by mouth every 8 (eight) hours. 05/26/20  Yes Bonnielee Haff, MD  insulin aspart (NOVOLOG) 100 UNIT/ML injection Inject into the skin 3 (three) times daily before meals. If BGC is less than 80, call MD  201-250, 2 units  251-300, 4 units 301-350, 6 units 351-400, 8 units 401-450, 10 units 451-500, 12 units 500 or more give 14 units of NovoLog, and recheck BCG in 2 hours   Yes [provider]  insulin glargine (LANTUS) 100 UNIT/ML Solostar Pen Inject 18 Units into the skin daily. Patient taking differently: Inject 15 Units into the skin See admin instructions. Inject 20 units into skin at 3 PM daily 02/07/20  Yes Anderson, Chelsey L, DO  Isosorbide Mononitrate (IMDUR PO) Take 20 mg by mouth 2 (two) times daily.   Yes [provider]  Lidocaine (ASPERCREME LIDOCAINE) 4 % PTCH Apply 1 patch topically 2 (two) times daily. Apply to upper back   Yes [provider]  Magnesium Oxide 400 MG CAPS Take 1 capsule (400 mg total) by mouth daily. 08/17/19  Yes Weaver, Scott T, PA-C  metaxalone (SKELAXIN) 800 MG tablet Take 1 tablet  by mouth in the morning, at noon, and at bedtime. 05/29/20  Yes [provider]  Multiple Vitamin (MULTIVITAMIN WITH MINERALS) TABS tablet Take 1 tablet by mouth daily. 05/27/20  Yes Bonnielee Haff, MD  ondansetron (ZOFRAN-ODT) 4 MG disintegrating tablet Take 4 mg by mouth every 8 (eight) hours as needed for nausea or vomiting.  06/08/20 Yes [provider]  oxyCODONE (OXY IR/ROXICODONE) 5 MG immediate release tablet Take 1 tablet (5 mg total) by mouth every 6 (six) hours as needed for severe pain. 05/26/20  Yes Bonnielee Haff, MD  pantoprazole (PROTONIX) 40 MG tablet Take 1 tablet (40 mg total) by mouth daily. 05/27/20  Yes Bonnielee Haff, MD  polyethylene glycol (MIRALAX / GLYCOLAX) 17 g packet Take 17 g by mouth 2 (two) times daily. 05/26/20  Yes Bonnielee Haff, MD  senna-docusate (SENOKOT-S) 8.6-50 MG tablet Take 2 tablets by mouth 2 (two) times daily. 05/26/20  Yes Bonnielee Haff, MD  acetaminophen (TYLENOL) 325 MG tablet Take 650 mg by mouth 3 (three) times daily as needed for mild pain or moderate pain.    [provider]  B-D UF III MINI PEN NEEDLES 31G X 5 MM MISC USE WITH LANTUS PEN DAILY AS DIRECTED 02/11/20   Carollee Leitz, MD  Blood Pressure KIT Monitor blood pressure twice a day 09/28/19   Carollee Leitz, MD  furosemide (LASIX) 40 MG tablet Take $RemoveBef'40mg'[id number]$  daily as needed whenever weight is greater than 190lbs. Patient not taking: Reported on 06/07/2020 05/26/20   Bonnielee Haff, MD  glucose blood (ACCU-CHEK AVIVA) test strip Use as instructed 06/16/17   Zenia Resides, MD  isosorbide mononitrate (IMDUR) 30 MG 24 hr tablet Take 0.5 tablets (15 mg total) by mouth  daily. Patient not taking: Reported on 06/07/2020 05/26/20   Bonnielee Haff, MD  Lancets (ACCU-CHEK SOFT [facility]) lancets Once daily testing. 06/16/17   Zenia Resides, MD  Nutritional Supplements (,FEEDING SUPPLEMENT, PROSOURCE PLUS) liquid Take 30 mLs by mouth 2 (two) times daily between meals. Patient not taking:  Reported on 06/07/2020 05/26/20   Bonnielee Haff, MD     Critical care time: n/a    Freda Jackson, MD [place] Office: [phone]   See Amion for personal pager PCCM on call pager [phone] until 7pm. Please call Elink 7p-7a. [phone]

## 2020-06-07 NOTE — Procedures (Signed)
Pre procedural Dx: Left sided pleural effusion Post procedural Dx: Same  Technically successful CT guided placed of a 14 Fr drainage catheter placement into the left pleural space yielding 950 cc of bloody appearing fluid.   A representative aspirated sample was capped and sent to the laboratory for analysis.    EBL: None Complications: None immediate  Katherina Right, MD Pager #: [phone]

## 2020-06-07 NOTE — H&P (Signed)
History and Physical    [name] [name] CBJ:[id number] DOB: 05/05/1950 DOA: 06/06/2020  PCP: Carollee Leitz, MD Patient coming from: [facility]  Chief Complaint: Abdominal pain, nausea, vomiting  HPI: [name] [name] is a 70 y.o. female with medical history significant of chronic systolic CHF with EF less than 20%, paroxysmal A. fib on anticoagulation, hypertension, hyperlipidemia, insulin-dependent type 2 diabetes, chronic anemia, bipolar disorder.  She was recently admitted to the hospital for acute embolic stroke which required tPA and mechanical thrombectomy.  Her hospital course was complicated by cardiogenic shock/PEA cardiac arrest immediately following her thrombectomy.  She was on milrinone infusion and diuretics.  She was extubated on 5/3 and weaned off vasopressors.  Underwent TEE/DCCV on 5/9.  She was discharged to [facility] on 5/16.  She now presents to the ED from her nursing facility for evaluation of right-sided abdominal pain, nausea, and vomiting.  Patient also endorsed shortness of breath when speaking.  She was on 3 L supplemental oxygen at [facility].  Satting well on 3 L oxygen in the ED.  Patient was afebrile and not tachycardic.  Slightly hypertensive.  Labs showing WBC 12.9 (leukocytosis improved compared to recent labs), hemoglobin 9.3 (stable), platelet count 493K.  Sodium 138, potassium 4.7, chloride 98, bicarb 37, BUN 28, creatinine 0.8, glucose 197.  AST mildly elevated at 66 and alk phos 168.  ALT and T bili normal.  Lipase normal.  UA with negative nitrite, small amount of leukocytes, 6-10 WBCs, and rare bacteria.  Blood culture x2 pending.  COVID and influenza PCR negative.  Chest x-ray showing complete opacification of the left hemithorax.  CT chest showing large complex multilocular left pleural effusion which has increased in size since recent CT of the neck dated 05/12/2020.  Differentials include empyema, sequela of previous hemothorax, or neoplasm. CT abdomen  pelvis negative for acute intra-abdominal or intrapelvic process.  Showing indeterminate hypodensity at the posterior aspect of the spleen which could reflect a small cyst or hemangioma. Medications administered include fentanyl, Reglan, and broad-spectrum antibiotics including vancomycin, cefepime, and metronidazole.  Patient states she has had right-sided abdominal pain since she has been at [facility] rehab.  She is also vomiting and not able to eat.  States she was constipated and was given laxatives at the rehab facility and is now having bowel movements.  Reports feeling short of breath when speaking.  Reports episode of mild substernal chest discomfort before coming into the emergency room.  No chest pain at present.  Denies fevers or cough.  She is vaccinated against COVID.  Review of Systems:  All systems reviewed and apart from history of presenting illness, are negative.  Past Medical History:  Diagnosis Date  . Abdominal pain   . Atrial flutter ([place])   . Back pain   . Chronic combined systolic and diastolic CHF (congestive heart failure) ([place]) 02/04/2020  . Constipation   . Diabetes mellitus without complication ([place])   . Fatigue   . Full dentures   . Hemorrhoids   . History of noncompliance with medical treatment, presenting hazards to health   . Hypertension   . Lack of adequate sleep   . Liver mass   . PAF (paroxysmal atrial fibrillation) ([place])   . Rash    on right breast  . Wears glasses     Past Surgical History:  Procedure Laterality Date  . BREAST DUCTAL SYSTEM EXCISION    . BREAST EXCISIONAL BIOPSY Left   . CARDIOVERSION N/A 07/31/2019  Procedure: CARDIOVERSION;  Surgeon: Pixie Casino, MD;  Location: [facility] ENDOSCOPY;  Service: Cardiovascular;  Laterality: N/A;  . CARDIOVERSION N/A 05/19/2020   Procedure: CARDIOVERSION;  Surgeon: Jolaine Artist, MD;  Location: [facility] ENDOSCOPY;  Service: Cardiovascular;  Laterality: N/A;  . CHOLECYSTECTOMY    . CHOLECYSTECTOMY,  LAPAROSCOPIC  07/24/2010  . ESOPHAGEAL DILATION    . IR CT HEAD LTD  05/12/2020  . IR PERCUTANEOUS ART THROMBECTOMY/INFUSION INTRACRANIAL INC DIAG ANGIO  05/12/2020      . IR PERCUTANEOUS ART THROMBECTOMY/INFUSION INTRACRANIAL INC DIAG ANGIO  05/12/2020  . IR US GUIDE VASC ACCESS RIGHT  05/12/2020  . RADIOLOGY WITH ANESTHESIA N/A 05/12/2020   Procedure: IR WITH ANESTHESIA;  Surgeon: Radiologist, Medication, MD;  Location: [facility];  Service: Radiology;  Laterality: N/A;  . TEE WITHOUT CARDIOVERSION N/A 07/31/2019   Procedure: TRANSESOPHAGEAL ECHOCARDIOGRAM (TEE);  Surgeon: Pixie Casino, MD;  Location: [facility];  Service: Cardiovascular;  Laterality: N/A;  . TEE WITHOUT CARDIOVERSION N/A 05/19/2020   Procedure: TRANSESOPHAGEAL ECHOCARDIOGRAM (TEE);  Surgeon: Jolaine Artist, MD;  Location: [facility] ENDOSCOPY;  Service: Cardiovascular;  Laterality: N/A;     reports that she has never smoked. She has never used smokeless tobacco. She reports that she does not drink alcohol and does not use drugs.  Allergies  Allergen Reactions  . Amiodarone Shortness Of Breath and Other (See Comments)    Soreness, wheezing, and weakness also  . Fruit & Vegetable Daily [Nutritional Supplements] Shortness Of Breath, Swelling and Other (See Comments)    All fruits cause tongue swelling and numbness Organic fruits are okay to eat  . Metoprolol Shortness Of Breath, Other (See Comments) and Cough    Soreness, wheezing, and weakness also  . Other Shortness Of Breath, Swelling and Other (See Comments)    Tree nuts cause tongue swelling and numbness  . Peanut-Containing Drug Products Shortness Of Breath, Swelling and Other (See Comments)    Tongue swelling and numbness  . Aspirin Nausea And Vomiting and Other (See Comments)    Cannot take due to liver issues  . Tylenol [Acetaminophen] Other (See Comments)    Cannot take due to liver issues- tolerating 650 mg tablets in 2022  . Beef-Derived Products Other (See Comments)     Limited, per patient  . Lactose Intolerance (Gi) Diarrhea and Nausea And Vomiting    Family History  Problem Relation Age of Onset  . Stroke Mother   . Other Father        broken heart after spouse spouse  . Diabetes Sister   . Hypertension Sister   . Diabetes Sister   . Multiple sclerosis Sister   . Alcohol abuse Other   . Diabetes Other   . Breast cancer Neg Hx     Prior to Admission medications   Medication Sig Start Date End Date Taking? Authorizing Provider  amiodarone (PACERONE) 200 MG tablet Take 1 tablet (200 mg total) by mouth 2 (two) times daily. 05/26/20  Yes Bonnielee Haff, MD  amLODipine (NORVASC) 5 MG tablet Take 5 mg by mouth daily.   Yes [provider]  atorvastatin (LIPITOR) 80 MG tablet Take 1 tablet (80 mg total) by mouth daily. 05/27/20  Yes Bonnielee Haff, MD  bisacodyl (DULCOLAX) 10 MG suppository Place 1 suppository (10 mg total) rectally daily as needed for moderate constipation. 05/26/20  Yes Bonnielee Haff, MD  digoxin (LANOXIN) 0.125 MG tablet Take 1 tablet (0.125 mg total) by mouth daily. 05/27/20  Yes Bonnielee Haff, MD  ELIQUIS 5 MG TABS tablet TAKE 1 TABLET(5 MG) BY MOUTH TWICE DAILY Patient taking differently: Take 5 mg by mouth 2 (two) times daily. 04/10/20  Yes Tobb, Kardie, DO  feeding supplement, GLUCERNA SHAKE, (GLUCERNA SHAKE) LIQD Take 237 mLs by mouth 3 (three) times daily between meals.   Yes [provider]  ferrous gluconate (FERGON) 324 MG tablet Take 324 mg by mouth daily.   Yes [provider]  hydrALAZINE (APRESOLINE) 10 MG tablet Take 1 tablet (10 mg total) by mouth every 8 (eight) hours. 05/26/20  Yes Bonnielee Haff, MD  insulin aspart (NOVOLOG) 100 UNIT/ML injection Inject into the skin 3 (three) times daily before meals. If BGC is less than 80, call MD  201-250, 2 units  251-300, 4 units 301-350, 6 units 351-400, 8 units 401-450, 10 units 451-500, 12 units 500 or more give 14 units of NovoLog, and  recheck BCG in 2 hours   Yes [provider]  insulin glargine (LANTUS) 100 UNIT/ML Solostar Pen Inject 18 Units into the skin daily. Patient taking differently: Inject 15 Units into the skin See admin instructions. Inject 20 units into skin at 3 PM daily 02/07/20  Yes Anderson, Chelsey L, DO  Isosorbide Mononitrate (IMDUR PO) Take 20 mg by mouth 2 (two) times daily.   Yes [provider]  Lidocaine (ASPERCREME LIDOCAINE) 4 % PTCH Apply 1 patch topically 2 (two) times daily. Apply to upper back   Yes [provider]  Magnesium Oxide 400 MG CAPS Take 1 capsule (400 mg total) by mouth daily. 08/17/19  Yes Weaver, Scott T, PA-C  metaxalone (SKELAXIN) 800 MG tablet Take 1 tablet by mouth in the morning, at noon, and at bedtime. 05/29/20  Yes [provider]  Multiple Vitamin (MULTIVITAMIN WITH MINERALS) TABS tablet Take 1 tablet by mouth daily. 05/27/20  Yes Bonnielee Haff, MD  ondansetron (ZOFRAN-ODT) 4 MG disintegrating tablet Take 4 mg by mouth every 8 (eight) hours as needed for nausea or vomiting.  06/08/20 Yes [provider]  oxyCODONE (OXY IR/ROXICODONE) 5 MG immediate release tablet Take 1 tablet (5 mg total) by mouth every 6 (six) hours as needed for severe pain. 05/26/20  Yes Bonnielee Haff, MD  pantoprazole (PROTONIX) 40 MG tablet Take 1 tablet (40 mg total) by mouth daily. 05/27/20  Yes Bonnielee Haff, MD  polyethylene glycol (MIRALAX / GLYCOLAX) 17 g packet Take 17 g by mouth 2 (two) times daily. 05/26/20  Yes Bonnielee Haff, MD  senna-docusate (SENOKOT-S) 8.6-50 MG tablet Take 2 tablets by mouth 2 (two) times daily. 05/26/20  Yes Bonnielee Haff, MD  acetaminophen (TYLENOL) 325 MG tablet Take 650 mg by mouth 3 (three) times daily as needed for mild pain or moderate pain.    [provider]  B-D UF III MINI PEN NEEDLES 31G X 5 MM MISC USE WITH LANTUS PEN DAILY AS DIRECTED 02/11/20   Carollee Leitz, MD  Blood Pressure KIT Monitor blood pressure  twice a day 09/28/19   Carollee Leitz, MD  furosemide (LASIX) 40 MG tablet Take $RemoveBef'40mg'fMHFYRWvaf$  daily as needed whenever weight is greater than 190lbs. Patient not taking: Reported on 06/07/2020 05/26/20   Bonnielee Haff, MD  glucose blood (ACCU-CHEK AVIVA) test strip Use as instructed 06/16/17   Zenia Resides, MD  isosorbide mononitrate (IMDUR) 30 MG 24 hr tablet Take 0.5 tablets (15 mg total) by mouth daily. Patient not taking: Reported on 06/07/2020 05/26/20   Bonnielee Haff, MD  Lancets (ACCU-CHEK SOFT [facility]) lancets Once  daily testing. 06/16/17   Zenia Resides, MD  Nutritional Supplements (,FEEDING SUPPLEMENT, PROSOURCE PLUS) liquid Take 30 mLs by mouth 2 (two) times daily between meals. Patient not taking: Reported on 06/07/2020 05/26/20   Bonnielee Haff, MD    Physical Exam: Vitals:   06/06/20 2328 06/07/20 0030 06/07/20 0430 06/07/20 0701  BP: (!) 151/67 (!) 158/65 (!) 155/68 (!) 181/78  Pulse: 74 73 70 79  Resp: 20 18 (!) 26 (!) 26  Temp:    99 F (37.2 C)  TempSrc:    Oral  SpO2: 100% 99% 99% 100%    Physical Exam Constitutional:      General: She is not in acute distress. HENT:     Head: Normocephalic and atraumatic.  Eyes:     Extraocular Movements: Extraocular movements intact.     Conjunctiva/sclera: Conjunctivae normal.  Cardiovascular:     Rate and Rhythm: Normal rate and regular rhythm.     Pulses: Normal pulses.  Pulmonary:     Effort: Pulmonary effort is normal.     Breath sounds: No wheezing.     Comments: Diminished breath sounds in the entire left lung field Abdominal:     General: Bowel sounds are normal. There is no distension.     Palpations: Abdomen is soft.     Tenderness: There is abdominal tenderness. There is no guarding.     Comments: Generalized tenderness to palpation  Musculoskeletal:        General: No swelling or tenderness.     Cervical back: Normal range of motion and neck supple.  Skin:    General: Skin is warm and dry.  Neurological:      General: No focal deficit present.     Mental Status: She is alert and oriented to person, place, and time.     Labs on Admission: I have personally reviewed following labs and imaging studies  CBC: Recent Labs  Lab 06/06/20 2137  WBC 12.9*  NEUTROABS 10.7*  HGB 9.3*  HCT 30.5*  MCV 82.2  PLT 450*   Basic Metabolic Panel: Recent Labs  Lab 06/06/20 2137  NA 138  K 4.7  CL 98  CO2 37*  GLUCOSE 197*  BUN 28*  CREATININE 0.80  CALCIUM 8.9   GFR: Estimated Creatinine Clearance: 69.6 mL/min (by C-G formula based on SCr of 0.8 mg/dL). Liver Function Tests: Recent Labs  Lab 06/06/20 2137  AST 66*  ALT 34  ALKPHOS 168*  BILITOT 0.5  PROT 7.5  ALBUMIN 2.2*   Recent Labs  Lab 06/06/20 2137  LIPASE 35   No results for input(s): AMMONIA in the last 168 hours. Coagulation Profile: No results for input(s): INR, PROTIME in the last 168 hours. Cardiac Enzymes: No results for input(s): CKTOTAL, CKMB, CKMBINDEX, TROPONINI in the last 168 hours. BNP (last 3 results) No results for input(s): PROBNP in the last 8760 hours. HbA1C: No results for input(s): HGBA1C in the last 72 hours. CBG: No results for input(s): GLUCAP in the last 168 hours. Lipid Profile: No results for input(s): CHOL, HDL, LDLCALC, TRIG, CHOLHDL, LDLDIRECT in the last 72 hours. Thyroid Function Tests: No results for input(s): TSH, T4TOTAL, FREET4, T3FREE, THYROIDAB in the last 72 hours. Anemia Panel: No results for input(s): VITAMINB12, FOLATE, FERRITIN, TIBC, IRON, RETICCTPCT in the last 72 hours. Urine analysis:    Component Value Date/Time   COLORURINE AMBER (A) 06/06/2020 2327   APPEARANCEUR CLOUDY (A) 06/06/2020 2327   LABSPEC 1.028 06/06/2020 [place]  5.0 06/06/2020 2327   GLUCOSEU 50 (A) 06/06/2020 2327   HGBUR NEGATIVE 06/06/2020 2327   BILIRUBINUR NEGATIVE 06/06/2020 2327   KETONESUR NEGATIVE 06/06/2020 2327   PROTEINUR 30 (A) 06/06/2020 2327   UROBILINOGEN 1.0 10/09/2014 1605    NITRITE NEGATIVE 06/06/2020 2327   LEUKOCYTESUR SMALL (A) 06/06/2020 2327    Radiological Exams on Admission: CT Abdomen Pelvis Wo Contrast  Result Date: 06/06/2020 CLINICAL DATA:  Right lower quadrant abdominal pain, nausea EXAM: CT ABDOMEN AND PELVIS WITHOUT CONTRAST TECHNIQUE: Multidetector CT imaging of the abdomen and pelvis was performed following the standard protocol without IV contrast. COMPARISON:  05/13/2020, 05/12/2020, 07/20/2015 FINDINGS: Lower chest: There is a large multiloculated heterogeneous left pleural effusion, with areas of higher attenuation seen anteriorly and posteriorly. There is complete atelectasis of the visualized left lung. Right lung is clear. The heart is unremarkable. Unenhanced CT was performed per clinician order. Lack of IV contrast limits sensitivity and specificity, especially for evaluation of abdominal/pelvic solid viscera. Hepatobiliary: No focal liver abnormality is seen. Status post cholecystectomy. No biliary dilatation. Pancreas: Unremarkable. No pancreatic ductal dilatation or surrounding inflammatory changes. Spleen: Indeterminate 1.3 cm hypodensity is seen within the posterior aspect of the spleen, image 37/2. Spleen is normal in size. Adrenals/Urinary Tract: No urinary tract calculi or obstructive uropathy within either kidney. The adrenals and bladder are unremarkable. Stomach/Bowel: No bowel obstruction or ileus. Normal appendix right lower quadrant. No bowel wall thickening or inflammatory change. Vascular/Lymphatic: Aortic atherosclerosis. No enlarged abdominal or pelvic lymph nodes. Reproductive: Status post hysterectomy. No adnexal masses. Other: No free fluid or free gas.  No abdominal wall hernia. Musculoskeletal: There are subacute healing right anterolateral fourth and fifth rib fractures with minimal callus formation. No acute bony abnormalities. Reconstructed images demonstrate no additional findings. IMPRESSION: 1. Large complex loculated left  pleural effusion, with complete atelectasis of the visualized left lower lobe. Differential diagnosis would include empyema or sequela of hemothorax. Dedicated CT of the chest may be useful to visualize this abnormality in its entirety. 2. No acute intra-abdominal or intrapelvic process. Normal appendix right lower quadrant. 3. Indeterminate hypodensity posterior aspect of the spleen, which could reflect a small cyst or hemangioma. 4.  Aortic Atherosclerosis (ICD10-I70.0). Electronically Signed   By: Randa Ngo M.D.   On: 06/06/2020 23:02   DG Chest 2 View  Result Date: 06/06/2020 CLINICAL DATA:  Large left effusion on recent CT of the abdomen EXAM: CHEST - 2 VIEW COMPARISON:  05/13/2020 FINDINGS: Delete opacification of the left hemithorax is noted. Cardiac shadow is stable. Right lung is clear. No acute bony abnormality is noted. IMPRESSION: Opacification of the left hemithorax consistent with the complex diffusion seen on recent CT of the abdomen. Electronically Signed   By: Inez Catalina M.D.   On: 06/06/2020 23:22   CT Chest Wo Contrast  Result Date: 06/07/2020 CLINICAL DATA:  Left pleural effusion, pneumonia, abnormal chest x-ray and abdominal CT EXAM: CT CHEST WITHOUT CONTRAST TECHNIQUE: Multidetector CT imaging of the chest was performed following the standard protocol without IV contrast. COMPARISON:  04/06/2010, 06/06/2020 FINDINGS: Cardiovascular: Unenhanced imaging of the heart and great vessels demonstrates no pericardial effusion. There is normal caliber of the thoracic aorta. Mild atherosclerosis of the aorta and coronary vessels. Mediastinum/Nodes: Chronic heterogeneous enlargement of the right lobe of the thyroid has been unchanged since 2012, compatible with goiter. This is been previously evaluated by ultrasound. Trachea and esophagus are unremarkable. Subcentimeter mediastinal adenopathy identified, largest measuring 8 mm in the AP window.  Lungs/Pleura: There is complete atelectasis of  the left lung. Heterogeneous multilocular left pleural effusion is identified, occupying the entire left hemithorax. Areas of higher attenuation are seen within the fluid collection anteriorly and posteriorly at the level of the hilum. Scattered hypoventilatory changes are seen within the right lung. Otherwise the right hemithorax is clear. Central airways are patent. Upper Abdomen: No acute abnormality. Musculoskeletal: No acute or destructive bony lesions. Reconstructed images demonstrate no additional findings. IMPRESSION: 1. Large complex multilocular left pleural effusion. This has increased in size since recent CT of the neck dated 05/12/2020. Differential includes empyema, sequela of previous hemothorax, or neoplasm. Correlation with cytology after thoracentesis may be useful. 2. Complete left lung atelectasis. 3. Stable heterogeneous enlargement right lobe thyroid compatible with goiter. This has been evaluated on previous imaging. Please refer to thyroid ultrasound dated 05/14/2020. (Ref: J Am Coll Radiol. 2015 Feb;12(2): 143-50). 4.  Aortic Atherosclerosis (ICD10-I70.0). Electronically Signed   By: Randa Ngo M.D.   On: 06/07/2020 00:31    EKG: Independently reviewed.  Sinus rhythm, QTC 526.  QT interval increased since prior tracing.  Assessment/Plan Principal Problem:   Pleural effusion Active Problems:   HTN (hypertension)   Mixed hyperlipidemia   Uncontrolled type 2 diabetes mellitus with hyperglycemia (HCC)   A-fib (HCC)   Left large loculated pleural effusion Chest x-ray showing complete opacification of left hemithorax.  CT chest showing large complex multilocular left pleural effusion which has increased in size since recent CT of the neck dated 05/12/2020.  Differentials include empyema, sequela of previous hemothorax, or neoplasm.  No fever, significant leukocytosis, or signs of sepsis.  She was on 3 L supplemental oxygen at [facility], no change in oxygen  requirement. -Continue broad-spectrum antibiotics including vancomycin, cefepime, and Zosyn.  Blood culture x2 pending.  Continuous pulse ox, supplemental oxygen as needed.  Order placed for IR thoracentesis and pleural fluid analysis labs.  Consult pulmonology in the morning.  Right-sided abdominal pain, nausea, vomiting COVID and influenza PCR negative.  Lipase normal and no significant elevation of LFTs.  CT negative for acute intra-abdominal or intrapelvic process to explain the patient's symptoms. -Symptomatic management  Chest pain Patient reports experiencing mild substernal chest discomfort before coming into the ED.  Currently chest pain-free.  EKG without acute ischemic changes. -Check high-sensitivity troponin level  Recent embolic CVA Status post tPA and mechanical thrombectomy during prior admission. -Continue Eliquis  Chronic systolic CHF EF less than 20% on recent echo.  No signs of volume overload on exam.  Seen by heart failure team during recent hospitalization and not on metoprolol due to allergy.  Not on Entresto or Farxiga due to orthostasis.  Not on spironolactone/ACE/ARB due to hyperkalemia. -Check BNP level.  Continue digoxin, hydralazine, and Imdur.  Paroxysmal A. Fib Underwent TEE/DCCV on 5/9.  Currently in sinus rhythm. -Continue amiodarone and Eliquis  QT prolongation Likely due to amiodarone use. -Cardiac monitoring.  Will not abruptly stop amiodarone given history of A. fib which required recent cardioversion.  Consider discussing with cardiology in the morning.  Will continue to monitor electrolytes including potassium and magnesium.  Avoid any other QT prolonging drugs if possible.  Repeat EKG in a.m.  Hypertension -Continue amlodipine, hydralazine, Imdur  Hyperlipidemia -Continue Lipitor  Poorly controlled insulin-dependent type 2 diabetes A1c 10.5 on 05/13/2020. -Continue home Lantus 15 units daily.  Sliding scale insulin sensitive.  DVT  prophylaxis: Eliquis Code Status: Patient wishes to be full code. Family Communication: No family at bedside. Disposition  Plan: Status is: Inpatient  Remains inpatient appropriate because:Inpatient level of care appropriate due to severity of illness   Dispo: The patient is from: SNF              Anticipated d/c is to: SNF              Patient currently is not medically stable to d/c.   Difficult to place patient No  Level of care: Level of care: Progressive   The medical decision making on this patient was of high complexity and the patient is at high risk for clinical deterioration, therefore this is a level 3 visit.  Shela Leff MD [facility]  If 7PM-7AM, please contact night-coverage [email or web address]  06/07/2020, 7:14 AM

## 2020-06-08 ENCOUNTER — Inpatient Hospital Stay (HOSPITAL_COMMUNITY): Payer: Medicare Other

## 2020-06-08 DIAGNOSIS — R9431 Abnormal electrocardiogram [ECG] [EKG]: Secondary | ICD-10-CM

## 2020-06-08 DIAGNOSIS — E782 Mixed hyperlipidemia: Secondary | ICD-10-CM

## 2020-06-08 DIAGNOSIS — J90 Pleural effusion, not elsewhere classified: Secondary | ICD-10-CM | POA: Diagnosis not present

## 2020-06-08 DIAGNOSIS — R079 Chest pain, unspecified: Secondary | ICD-10-CM

## 2020-06-08 DIAGNOSIS — I639 Cerebral infarction, unspecified: Secondary | ICD-10-CM

## 2020-06-08 DIAGNOSIS — R1011 Right upper quadrant pain: Secondary | ICD-10-CM

## 2020-06-08 LAB — CBC WITH DIFFERENTIAL/PLATELET
Abs Immature Granulocytes: 0.04 10*3/uL (ref 0.00–0.07)
Basophils Absolute: 0 10*3/uL (ref 0.0–0.1)
Basophils Relative: 0 %
Eosinophils Absolute: 0.1 10*3/uL (ref 0.0–0.5)
Eosinophils Relative: 1 %
HCT: 31.3 % — ABNORMAL LOW (ref 36.0–46.0)
Hemoglobin: 9.4 g/dL — ABNORMAL LOW (ref 12.0–15.0)
Immature Granulocytes: 0 %
Lymphocytes Relative: 10 %
Lymphs Abs: 1.1 10*3/uL (ref 0.7–4.0)
MCH: 25 pg — ABNORMAL LOW (ref 26.0–34.0)
MCHC: 30 g/dL (ref 30.0–36.0)
MCV: 83.2 fL (ref 80.0–100.0)
Monocytes Absolute: 1.1 10*3/uL — ABNORMAL HIGH (ref 0.1–1.0)
Monocytes Relative: 10 %
Neutro Abs: 8.5 10*3/uL — ABNORMAL HIGH (ref 1.7–7.7)
Neutrophils Relative %: 79 %
Platelets: 556 10*3/uL — ABNORMAL HIGH (ref 150–400)
RBC: 3.76 MIL/uL — ABNORMAL LOW (ref 3.87–5.11)
RDW: 18.8 % — ABNORMAL HIGH (ref 11.5–15.5)
WBC: 10.8 10*3/uL — ABNORMAL HIGH (ref 4.0–10.5)
nRBC: 0 % (ref 0.0–0.2)

## 2020-06-08 LAB — MAGNESIUM: Magnesium: 2.1 mg/dL (ref 1.7–2.4)

## 2020-06-08 LAB — URINE CULTURE

## 2020-06-08 LAB — COMPREHENSIVE METABOLIC PANEL
ALT: 30 U/L (ref 0–44)
AST: 39 U/L (ref 15–41)
Albumin: 2.3 g/dL — ABNORMAL LOW (ref 3.5–5.0)
Alkaline Phosphatase: 156 U/L — ABNORMAL HIGH (ref 38–126)
Anion gap: 4 — ABNORMAL LOW (ref 5–15)
BUN: 26 mg/dL — ABNORMAL HIGH (ref 8–23)
CO2: 32 mmol/L (ref 22–32)
Calcium: 8.8 mg/dL — ABNORMAL LOW (ref 8.9–10.3)
Chloride: 104 mmol/L (ref 98–111)
Creatinine, Ser: 0.74 mg/dL (ref 0.44–1.00)
GFR, Estimated: >60 mL/min (ref >60)
Glucose, Bld: 78 mg/dL (ref 70–99)
Potassium: 4.5 mmol/L (ref 3.5–5.1)
Sodium: 140 mmol/L (ref 135–145)
Total Bilirubin: 0.7 mg/dL (ref 0.3–1.2)
Total Protein: 7.4 g/dL (ref 6.5–8.1)

## 2020-06-08 LAB — GLUCOSE, CAPILLARY
Glucose-Capillary: 70 mg/dL (ref 70–99)
Glucose-Capillary: 177 mg/dL — ABNORMAL HIGH (ref 70–99)
Glucose-Capillary: 186 mg/dL — ABNORMAL HIGH (ref 70–99)
Glucose-Capillary: 155 mg/dL — ABNORMAL HIGH (ref 70–99)

## 2020-06-08 LAB — PROTEIN, TOTAL: Total Protein: 7.3 g/dL (ref 6.5–8.1)

## 2020-06-08 LAB — LACTATE DEHYDROGENASE: LDH: 417 U/L — ABNORMAL HIGH (ref 98–192)

## 2020-06-08 LAB — PH, BODY FLUID: pH, Body Fluid: 7.4

## 2020-06-08 LAB — DIGOXIN LEVEL: Digoxin Level: 1.4 ng/mL (ref 0.8–2.0)

## 2020-06-08 MED ORDER — KETOROLAC TROMETHAMINE 30 MG/ML IJ SOLN
30.0000 mg | Freq: Once | INTRAMUSCULAR | Status: AC
  Administered 2020-06-08: 30 mg via INTRAVENOUS
  Filled 2020-06-08: qty 1

## 2020-06-08 MED ORDER — OXYCODONE HCL 5 MG PO TABS
10.0000 mg | ORAL_TABLET | ORAL | Status: DC | PRN
  Administered 2020-06-08 – 2020-06-21 (×41): 10 mg via ORAL
  Filled 2020-06-08 (×41): qty 2

## 2020-06-08 MED ORDER — HYDRALAZINE HCL 50 MG PO TABS: 50.0000 mg | ORAL_TABLET | Freq: Three times a day (TID) | ORAL | Status: DC

## 2020-06-08 MED ORDER — POLYETHYLENE GLYCOL 3350 17 G PO PACK
17.0000 g | PACK | Freq: Two times a day (BID) | ORAL | Status: DC
  Administered 2020-06-08 – 2020-06-21 (×24): 17 g via ORAL
  Filled 2020-06-08 (×26): qty 1

## 2020-06-08 MED ORDER — KETOROLAC TROMETHAMINE 15 MG/ML IJ SOLN
15.0000 mg | Freq: Four times a day (QID) | INTRAMUSCULAR | Status: AC | PRN
  Administered 2020-06-10: 15 mg via INTRAVENOUS
  Filled 2020-06-08: qty 1

## 2020-06-08 MED ORDER — SODIUM CHLORIDE (PF) 0.9 % IJ SOLN
10.0000 mg | Freq: Once | INTRAMUSCULAR | Status: AC
  Administered 2020-06-08: 10 mg via INTRAPLEURAL
  Filled 2020-06-08: qty 10

## 2020-06-08 MED ORDER — SORBITOL 70 % SOLN
30.0000 mL | Freq: Once | Status: AC
  Administered 2020-06-08: 30 mL via ORAL
  Filled 2020-06-08: qty 30

## 2020-06-08 MED ORDER — HYDRALAZINE HCL 50 MG PO TABS
50.0000 mg | ORAL_TABLET | Freq: Two times a day (BID) | ORAL | Status: DC
  Administered 2020-06-08 (×2): 50 mg via ORAL
  Filled 2020-06-08 (×2): qty 1

## 2020-06-08 MED ORDER — STERILE WATER FOR INJECTION IJ SOLN
5.0000 mg | Freq: Once | RESPIRATORY_TRACT | Status: AC
  Administered 2020-06-08: 5 mg via INTRAPLEURAL
  Filled 2020-06-08: qty 5

## 2020-06-08 NOTE — Progress Notes (Addendum)
Referring Physician(s): Dr. Cline Cools  Supervising Physician: Ruthann Cancer  Patient Status:  [facility] - In-pt  Chief Complaint:  Left sided hemothorax s/p left sided chest tube by Dr. Pascal Lux on 5.28.22  Subjective:  Patient states that her breathing has improved.   Allergies: Amiodarone, Fruit & vegetable daily [nutritional supplements], Metoprolol, Other, Peanut-containing drug products, Aspirin, Tylenol [acetaminophen], Beef-derived products, and Lactose intolerance (gi)  Medications: Prior to Admission medications   Medication Sig Start Date End Date Taking? Authorizing Provider  amiodarone (PACERONE) 200 MG tablet Take 1 tablet (200 mg total) by mouth 2 (two) times daily. 05/26/20  Yes Bonnielee Haff, MD  amLODipine (NORVASC) 5 MG tablet Take 5 mg by mouth daily.   Yes [provider]  atorvastatin (LIPITOR) 80 MG tablet Take 1 tablet (80 mg total) by mouth daily. 05/27/20  Yes Bonnielee Haff, MD  bisacodyl (DULCOLAX) 10 MG suppository Place 1 suppository (10 mg total) rectally daily as needed for moderate constipation. 05/26/20  Yes Bonnielee Haff, MD  digoxin (LANOXIN) 0.125 MG tablet Take 1 tablet (0.125 mg total) by mouth daily. 05/27/20  Yes Bonnielee Haff, MD  ELIQUIS 5 MG TABS tablet TAKE 1 TABLET(5 MG) BY MOUTH TWICE DAILY Patient taking differently: Take 5 mg by mouth 2 (two) times daily. 04/10/20  Yes Tobb, Kardie, DO  feeding supplement, GLUCERNA SHAKE, (GLUCERNA SHAKE) LIQD Take 237 mLs by mouth 3 (three) times daily between meals.   Yes [provider]  ferrous gluconate (FERGON) 324 MG tablet Take 324 mg by mouth daily.   Yes [provider]  hydrALAZINE (APRESOLINE) 10 MG tablet Take 1 tablet (10 mg total) by mouth every 8 (eight) hours. 05/26/20  Yes Bonnielee Haff, MD  insulin aspart (NOVOLOG) 100 UNIT/ML injection Inject into the skin 3 (three) times daily before meals. If BGC is less than 80, call MD  201-250, 2 units  251-300, 4  units 301-350, 6 units 351-400, 8 units 401-450, 10 units 451-500, 12 units 500 or more give 14 units of NovoLog, and recheck BCG in 2 hours   Yes [provider]  insulin glargine (LANTUS) 100 UNIT/ML Solostar Pen Inject 18 Units into the skin daily. Patient taking differently: Inject 15 Units into the skin See admin instructions. Inject 20 units into skin at 3 PM daily 02/07/20  Yes Anderson, Chelsey L, DO  Isosorbide Mononitrate (IMDUR PO) Take 20 mg by mouth 2 (two) times daily.   Yes [provider]  Lidocaine (ASPERCREME LIDOCAINE) 4 % PTCH Apply 1 patch topically 2 (two) times daily. Apply to upper back   Yes [provider]  Magnesium Oxide 400 MG CAPS Take 1 capsule (400 mg total) by mouth daily. 08/17/19  Yes Weaver, Scott T, PA-C  metaxalone (SKELAXIN) 800 MG tablet Take 1 tablet by mouth in the morning, at noon, and at bedtime. 05/29/20  Yes [provider]  Multiple Vitamin (MULTIVITAMIN WITH MINERALS) TABS tablet Take 1 tablet by mouth daily. 05/27/20  Yes Bonnielee Haff, MD  ondansetron (ZOFRAN-ODT) 4 MG disintegrating tablet Take 4 mg by mouth every 8 (eight) hours as needed for nausea or vomiting.  06/08/20 Yes [provider]  oxyCODONE (OXY IR/ROXICODONE) 5 MG immediate release tablet Take 1 tablet (5 mg total) by mouth every 6 (six) hours as needed for severe pain. 05/26/20  Yes Bonnielee Haff, MD  pantoprazole (PROTONIX) 40 MG tablet Take 1 tablet (40 mg total) by mouth daily. 05/27/20  Yes Bonnielee Haff, MD  polyethylene  glycol (MIRALAX / GLYCOLAX) 17 g packet Take 17 g by mouth 2 (two) times daily. 05/26/20  Yes Bonnielee Haff, MD  senna-docusate (SENOKOT-S) 8.6-50 MG tablet Take 2 tablets by mouth 2 (two) times daily. 05/26/20  Yes Bonnielee Haff, MD  acetaminophen (TYLENOL) 325 MG tablet Take 650 mg by mouth 3 (three) times daily as needed for mild pain or moderate pain.    [provider]  B-D UF III MINI PEN NEEDLES 31G X  5 MM MISC USE WITH LANTUS PEN DAILY AS DIRECTED 02/11/20   Carollee Leitz, MD  Blood Pressure KIT Monitor blood pressure twice a day 09/28/19   Carollee Leitz, MD  furosemide (LASIX) 40 MG tablet Take $RemoveBef'40mg'ohxqFUYcvt$  daily as needed whenever weight is greater than 190lbs. Patient not taking: Reported on 06/07/2020 05/26/20   Bonnielee Haff, MD  glucose blood (ACCU-CHEK AVIVA) test strip Use as instructed 06/16/17   Zenia Resides, MD  isosorbide mononitrate (IMDUR) 30 MG 24 hr tablet Take 0.5 tablets (15 mg total) by mouth daily. Patient not taking: Reported on 06/07/2020 05/26/20   Bonnielee Haff, MD  Lancets (ACCU-CHEK SOFT [facility]) lancets Once daily testing. 06/16/17   Zenia Resides, MD  Nutritional Supplements (,FEEDING SUPPLEMENT, PROSOURCE PLUS) liquid Take 30 mLs by mouth 2 (two) times daily between meals. Patient not taking: Reported on 06/07/2020 05/26/20   Bonnielee Haff, MD     Vital Signs: BP (!) 173/88 (BP Location: Left Arm)   Pulse 79   Temp 97.7 F (36.5 C) (Oral)   Resp 18   Wt 179 lb 10.8 oz (81.5 kg)   SpO2 (!) 87%   BMI 31.83 kg/m   Physical Exam Vitals and nursing note reviewed.  Constitutional:      Appearance: She is well-developed.  HENT:     Head: Normocephalic and atraumatic.  Eyes:     Conjunctiva/sclera: Conjunctivae normal.  Pulmonary:     Comments: improved work of breathing with no use of accessory muscles. Patient on O2 via [place]. 15 ml of output noted to be in the atrium. Site is unremarkable with no erythema, edema, tenderness, bleeding or drainage noted at exit site.  Chest tube is able to be flushed easily. No air leak noted.   Musculoskeletal:        General: Normal range of motion.     Cervical back: Normal range of motion.  Skin:    General: Skin is warm.  Neurological:     Mental Status: She is alert and oriented to person, place, and time.     Imaging: CT Abdomen Pelvis Wo Contrast  Result Date: 06/06/2020 CLINICAL DATA:  Right lower quadrant  abdominal pain, nausea EXAM: CT ABDOMEN AND PELVIS WITHOUT CONTRAST TECHNIQUE: Multidetector CT imaging of the abdomen and pelvis was performed following the standard protocol without IV contrast. COMPARISON:  05/13/2020, 05/12/2020, 07/20/2015 FINDINGS: Lower chest: There is a large multiloculated heterogeneous left pleural effusion, with areas of higher attenuation seen anteriorly and posteriorly. There is complete atelectasis of the visualized left lung. Right lung is clear. The heart is unremarkable. Unenhanced CT was performed per clinician order. Lack of IV contrast limits sensitivity and specificity, especially for evaluation of abdominal/pelvic solid viscera. Hepatobiliary: No focal liver abnormality is seen. Status post cholecystectomy. No biliary dilatation. Pancreas: Unremarkable. No pancreatic ductal dilatation or surrounding inflammatory changes. Spleen: Indeterminate 1.3 cm hypodensity is seen within the posterior aspect of the spleen, image 37/2. Spleen is normal in size. Adrenals/Urinary Tract: No urinary tract calculi  or obstructive uropathy within either kidney. The adrenals and bladder are unremarkable. Stomach/Bowel: No bowel obstruction or ileus. Normal appendix right lower quadrant. No bowel wall thickening or inflammatory change. Vascular/Lymphatic: Aortic atherosclerosis. No enlarged abdominal or pelvic lymph nodes. Reproductive: Status post hysterectomy. No adnexal masses. Other: No free fluid or free gas.  No abdominal wall hernia. Musculoskeletal: There are subacute healing right anterolateral fourth and fifth rib fractures with minimal callus formation. No acute bony abnormalities. Reconstructed images demonstrate no additional findings. IMPRESSION: 1. Large complex loculated left pleural effusion, with complete atelectasis of the visualized left lower lobe. Differential diagnosis would include empyema or sequela of hemothorax. Dedicated CT of the chest may be useful to visualize this  abnormality in its entirety. 2. No acute intra-abdominal or intrapelvic process. Normal appendix right lower quadrant. 3. Indeterminate hypodensity posterior aspect of the spleen, which could reflect a small cyst or hemangioma. 4.  Aortic Atherosclerosis (ICD10-I70.0). Electronically Signed   By: Randa Ngo M.D.   On: 06/06/2020 23:02   DG Chest 2 View  Result Date: 06/06/2020 CLINICAL DATA:  Large left effusion on recent CT of the abdomen EXAM: CHEST - 2 VIEW COMPARISON:  05/13/2020 FINDINGS: Delete opacification of the left hemithorax is noted. Cardiac shadow is stable. Right lung is clear. No acute bony abnormality is noted. IMPRESSION: Opacification of the left hemithorax consistent with the complex diffusion seen on recent CT of the abdomen. Electronically Signed   By: Inez Catalina M.D.   On: 06/06/2020 23:22   CT Chest Wo Contrast  Result Date: 06/07/2020 CLINICAL DATA:  Left pleural effusion, pneumonia, abnormal chest x-ray and abdominal CT EXAM: CT CHEST WITHOUT CONTRAST TECHNIQUE: Multidetector CT imaging of the chest was performed following the standard protocol without IV contrast. COMPARISON:  04/06/2010, 06/06/2020 FINDINGS: Cardiovascular: Unenhanced imaging of the heart and great vessels demonstrates no pericardial effusion. There is normal caliber of the thoracic aorta. Mild atherosclerosis of the aorta and coronary vessels. Mediastinum/Nodes: Chronic heterogeneous enlargement of the right lobe of the thyroid has been unchanged since 2012, compatible with goiter. This is been previously evaluated by ultrasound. Trachea and esophagus are unremarkable. Subcentimeter mediastinal adenopathy identified, largest measuring 8 mm in the AP window. Lungs/Pleura: There is complete atelectasis of the left lung. Heterogeneous multilocular left pleural effusion is identified, occupying the entire left hemithorax. Areas of higher attenuation are seen within the fluid collection anteriorly and  posteriorly at the level of the hilum. Scattered hypoventilatory changes are seen within the right lung. Otherwise the right hemithorax is clear. Central airways are patent. Upper Abdomen: No acute abnormality. Musculoskeletal: No acute or destructive bony lesions. Reconstructed images demonstrate no additional findings. IMPRESSION: 1. Large complex multilocular left pleural effusion. This has increased in size since recent CT of the neck dated 05/12/2020. Differential includes empyema, sequela of previous hemothorax, or neoplasm. Correlation with cytology after thoracentesis may be useful. 2. Complete left lung atelectasis. 3. Stable heterogeneous enlargement right lobe thyroid compatible with goiter. This has been evaluated on previous imaging. Please refer to thyroid ultrasound dated 05/14/2020. (Ref: J Am Coll Radiol. 2015 Feb;12(2): 143-50). 4.  Aortic Atherosclerosis (ICD10-I70.0). Electronically Signed   By: Randa Ngo M.D.   On: 06/07/2020 00:31   US Abdomen Complete  Result Date: 06/07/2020 CLINICAL DATA:  Abdominal and back pain EXAM: ABDOMEN ULTRASOUND COMPLETE COMPARISON:  06/06/2020 FINDINGS: Gallbladder: Surgically absent. Common bile duct: Diameter: 5 mm Liver: No focal lesion identified. Within normal limits in parenchymal echogenicity. Portal vein is patent  on color Doppler imaging with normal direction of blood flow towards the liver. IVC: No abnormality visualized. Pancreas: Visualized portion unremarkable. Spleen: Normal size and appearance. Evaluation is limited due to overlying bandaging from left chest tube. The splenic hypodensity seen on CT is not visualized by ultrasound. Right Kidney: Length: 10.5 cm. Echogenicity within normal limits. No mass or hydronephrosis visualized. Left Kidney: Length: 10.9 cm. Echogenicity within normal limits. No mass or hydronephrosis visualized. Abdominal aorta: No aneurysm visualized. Other findings: None. IMPRESSION: 1. Grossly unremarkable abdominal  ultrasound. 2. Limited evaluation of the spleen given overlying bandage material related to left-sided chest tube. The hypodensity seen on CT is not visualized by ultrasound. Electronically Signed   By: Randa Ngo M.D.   On: 06/07/2020 21:55   DG CHEST PORT 1 VIEW  Result Date: 06/08/2020 CLINICAL DATA:  Pleural effusion EXAM: PORTABLE CHEST 1 VIEW COMPARISON:  CT chest dated 06/07/2020 FINDINGS: Near complete opacification of the left hemithorax, related to the patient's known complex pleural effusion/hemothorax. Mild lucency at the lung base with indwelling pigtail drainage catheter. Right lung is clear. The heart is partially obscured. IMPRESSION: Near complete opacification of the left hemithorax, now with mild lucency at the left lung base, with indwelling pigtail drainage catheter. Electronically Signed   By: Julian Hy M.D.   On: 06/08/2020 05:46   CT [facility] PLEURAL DRAIN W/INDWELL CATH W/IMG GUIDE  Result Date: 06/07/2020 INDICATION: Large left-sided pleural effusion, worrisome for hemothorax given history of recent CPR with subsequent initiation anticoagulation. Please perform image guided left-sided chest tube placement for therapeutic and diagnostic purposes. EXAM: CT PERC PLEURAL DRAIN W/INDWELL CATH W/IMG GUIDE COMPARISON:  Chest CT-06/07/2020 MEDICATIONS: The patient is currently admitted to the hospital and receiving intravenous antibiotics. The antibiotics were administered within an appropriate time frame prior to the initiation of the procedure. ANESTHESIA/SEDATION: Moderate (conscious) sedation was employed during this procedure. A total of Versed 2 mg and Fentanyl 100 mcg was administered intravenously. Moderate Sedation Time: 19 minutes. The patient's level of consciousness and vital signs were monitored continuously by radiology nursing throughout the procedure under my direct supervision. CONTRAST:  None COMPLICATIONS: None immediate. PROCEDURE: Informed written consent was  obtained from the patient after a discussion of the risks, benefits and alternatives to treatment. The patient was placed supine, slightly RPO on the CT gantry and a pre procedural CT was performed re-demonstrating the known large likely partially loculated left-sided pleural effusion. The procedure was planned. A timeout was performed prior to the initiation of the procedure. The skin overlying the inferolateral aspect of left chest was prepped and draped in the usual sterile fashion. The overlying soft tissues were anesthetized with 1% lidocaine with epinephrine. Appropriate trajectory was planned with the use of a 22 gauge spinal needle. An 18 gauge trocar needle was advanced into the abscess/fluid collection and a short Amplatz super stiff wire was coiled within the collection. Appropriate positioning was confirmed with a limited CT scan. The tract was serially dilated allowing placement of a 14 French all-purpose drainage catheter. Appropriate positioning was confirmed with a limited postprocedural CT scan. Approximately 950 cc of bloody fluid was aspirated. The tube was connected to a pleura vac device and sutured in place. A dressing was applied. The patient tolerated the procedure well without immediate post procedural complication. IMPRESSION: Successful CT guided placement of a 46 French all purpose drain catheter into the basilar aspect the left pleural space with aspiration of 950 cc of bloody fluid. Samples were sent to  the laboratory as requested by the ordering clinical team. Electronically Signed   By: Sandi Mariscal M.D.   On: 06/07/2020 15:16    Labs:  CBC: Recent Labs    05/26/20 0433 06/06/20 2137 06/07/20 0823 06/08/20 0438  WBC 17.1* 12.9* 11.9* 10.8*  HGB 8.3* 9.3* 9.1* 9.4*  HCT 26.3* 30.5* 29.8* 31.3*  PLT 313 493* 481* 556*    COAGS: Recent Labs    07/31/19 0255 05/12/20 1220 05/14/20 0507 05/19/20 1045 06/07/20 1533  INR 1.3* 1.3*  --  1.3* 1.8*  APTT  --  26 92*   --   --     BMP: Recent Labs    08/31/19 0948 11/09/19 1201 02/03/20 1445 03/03/20 1559 03/05/20 1524 03/18/20 1645 05/26/20 0433 06/06/20 2137 06/07/20 0823 06/08/20 0438  NA 142 145*   < > 138 139   < > 127* 138 137 140  K 4.4 4.5   < > 5.1 4.8   < > 4.5 4.7 4.8 4.5  CL 102 105   < > 104 102   < > 83* 98 100 104  CO2 28 27   < > 21 24   < > 33* 37* 33* 32  GLUCOSE 100* 139*   < > 161* 197*   < > 198* 197* 178* 78  BUN 13 13   < > 16 17   < > 31* 28* 27* 26*  CALCIUM 9.7 9.6   < > 9.1 8.9   < > 8.9 8.9 8.4* 8.8*  CREATININE 0.77 0.80   < > 0.96 1.08*   < > 1.35* 0.80 0.85 0.74  GFRNONAA 80 77   < > 61 53*   < > 43* >60 >60 >60  GFRAA 92 88  --  70 61  --   --   --   --   --    < > = values in this interval not displayed.    LIVER FUNCTION TESTS: Recent Labs    05/23/20 0155 06/06/20 2137 06/07/20 0823 06/08/20 0438  BILITOT 1.1 0.5 0.9 0.7  AST 24 66* 47* 39  ALT 25 34 32 30  ALKPHOS 151* 168* 155* 156*  PROT 6.6 7.5 7.2 7.4  7.3  ALBUMIN 2.2* 2.2* 2.1* 2.3*    Assessment and Plan:  70 y.o. female inpatient. History of DM, Cardiomyopathy, CHF, atrial arrhythmias with left ventricular dysfunction and recent  CVA (5.39.76) complicated by PEA arrest during thrombectomy. Presented to the ED at [facility] with right sided abdominal pain, nausea and vomiting. Found to have a left sided Hemothorax thought to be traumatic in nature from PEA arrest on 5.22.22. CT Chest from 5.28.22 reads Large complex multilocular left pleural effusion. This has increased in size since recent CT of the neck dated 05/12/2020. Differential includes empyema, sequela of previous hemothorax, or neoplasm. Correlation with cytology after thoracentesis may be useful. On 5.28.22 IR placed a 14 Fr left sided chest tube with 950 ml of bloody appearing output aspiration at time of placement. Patient seen at bedside 15 ml of dark fluid OP noted to be in the atrium. Output is bloody in apperance. Chest tube is able  to flushed. No air leak appreciated. Dressing is clean dry and intact. Patient's work of breathing improved.  WBC is 10.8. Hgb 9.4, BUN 26. Cultures from pleural fluid pending., Gram stain shows rare WBC -PMN and mononuclear. Fungus culture pending. Per note from internal medicine Patient's eliquis is being discontinued at this time.  IR will continue to follow along - plans per pulmonology. Team please call IR with questions or concerns  Electronically Signed: Jacqualine Mau, NP 06/08/2020, 9:44 AM   I spent a total of 15 Minutes at the patient's bedside AND on the patient's hospital floor or unit, greater than 50% of which was counseling/coordinating care for left sided chest tube

## 2020-06-08 NOTE — Progress Notes (Addendum)
Grimacing/groaning noted, paged MD Janee Morn for further pain medications, given Morphine and Oxycodone, with no relief during this shift.   Shift Summary:18:42- Given one time dose of 30 mg Toradol followed by 10 mg of oxycodone. Appears to be more relaxed, RR even and unlabored, patient endorses, " feeling a lot better." Vital signs remained stable, did not meet requirement for PRN medication management for blood pressure.  Left Chest tube dressing remains dry and intact, chest tube put out a total of 1000 ml, on my shift, marked and initialed on chamber. Pure-wick remains, clean, dry and intact, daughter visited during this shift. Chest x ray done with repeat chest x ray due for tomorrow. Remains on telemetry, on 3L of nasal cannula, continuous pulse ox, poor appetite noted during this shift. Does better with crushing medications in apple sauce. No other needs identified. Will continue to monitor.   1850: blood pressure recheck 161/112 HR 76. Will have night shift recheck.

## 2020-06-08 NOTE — Progress Notes (Signed)
Progress Note  Patient Name: [name] [name] Date of Encounter: 06/08/2020  Primary Cardiologist: Thomasene Ripple, DO     Patient Profile     70 y.o. female with schizophrenia/bipolar.admitted with worsening SOB, RUQ and non cardiac chest pain found to have large L complicated pleural effusion  Hx of presumed rate related NICM, CHF in setting of recurrent atrial arrhythmias, recently hospitalized with acute stroke Rx with tPA/thrombectomy and Cx by PEA arrest, shock and worsening CM now on amio s/p DCCV   Thoracentesis w Chest tube 5/28>> 900 cc bloody fluid; Apixoban now on hold       Subjective   Much better with less dyspnea, less abdominal and chest pain   Inpatient Medications    Scheduled Meds: . alteplase (TPA) for intrapleural administration  10 mg Intrapleural Once   And  . pulmozyme (DORNASE) for intrapleural administration  5 mg Intrapleural Once  . amiodarone  200 mg Oral BID  . amLODipine  5 mg Oral Daily  . atorvastatin  80 mg Oral Daily  . digoxin  0.125 mg Oral Daily  . ferrous gluconate  324 mg Oral Daily  . hydrALAZINE  25 mg Oral Q8H  . insulin aspart  0-5 Units Subcutaneous QHS  . insulin aspart  0-9 Units Subcutaneous TID WC  . insulin glargine  15 Units Subcutaneous Daily  . isosorbide mononitrate  15 mg Oral BID  . magnesium oxide  400 mg Oral Daily  . scopolamine  1 patch Transdermal Q72H  . senna-docusate  1 tablet Oral BID   Continuous Infusions: . ceFEPime (MAXIPIME) IV 2 g (06/08/20 0240)  . metronidazole 500 mg (06/08/20 0140)  . vancomycin 1,000 mg (06/08/20 0001)   PRN Meds: hydrALAZINE, morphine injection, oxyCODONE   Vital Signs    Vitals:   06/07/20 2210 06/08/20 0230 06/08/20 0500 06/08/20 0656  BP: (!) 181/76 (!) 176/88  (!) 173/88  Pulse: 79 80  79  Resp: 20 20  18   Temp: 98.2 F (36.8 C) 97.8 F (36.6 C)  97.7 F (36.5 C)  TempSrc: Oral Oral  Oral  SpO2: 99% 100%  (!) 87%  Weight:   81.5 kg     Intake/Output  Summary (Last 24 hours) at 06/08/2020 0832 Last data filed at 06/07/2020 1832 Gross per 24 hour  Intake 399.15 ml  Output 900 ml  Net -500.85 ml   Filed Weights   06/08/20 0500  Weight: 81.5 kg    Telemetry    *sinus* - Personally Reviewed  ECG       Physical Exam    GEN: No acute distress.   Neck:  JVD 7-8  Cardiac: RRR, no murmurs, rubs, or gallops.  Respiratory: Clear to auscultation bilaterally. GI: Soft, nontender, non-distended  MS: No edema; No deformity. Neuro:  Nonfocal  Psych: flat affect   Labs    Chemistry Recent Labs  Lab 06/06/20 2137 06/07/20 0823 06/08/20 0438  NA 138 137 140  K 4.7 4.8 4.5  CL 98 100 104  CO2 37* 33* 32  GLUCOSE 197* 178* 78  BUN 28* 27* 26*  CREATININE 0.80 0.85 0.74  CALCIUM 8.9 8.4* 8.8*  PROT 7.5 7.2 7.4  7.3  ALBUMIN 2.2* 2.1* 2.3*  AST 66* 47* 39  ALT 34 32 30  ALKPHOS 168* 155* 156*  BILITOT 0.5 0.9 0.7  GFRNONAA >60 >60 >60  ANIONGAP 3* 4* 4*     Hematology Recent Labs  Lab 06/06/20 2137 06/07/20 06/09/20 06/08/20 06/10/20  WBC 12.9* 11.9* 10.8*  RBC 3.71* 3.63* 3.76*  HGB 9.3* 9.1* 9.4*  HCT 30.5* 29.8* 31.3*  MCV 82.2 82.1 83.2  MCH 25.1* 25.1* 25.0*  MCHC 30.5 30.5 30.0  RDW 18.6* 18.6* 18.8*  PLT 493* 481* 556*    Cardiac EnzymesNo results for input(s): TROPONINI in the last 168 hours. No results for input(s): TROPIPOC in the last 168 hours.   BNP Recent Labs  Lab 06/07/20 0735  BNP 707.6*     DDimer No results for input(s): DDIMER in the last 168 hours.   Radiology    CT Abdomen Pelvis Wo Contrast  Result Date: 06/06/2020 CLINICAL DATA:  Right lower quadrant abdominal pain, nausea EXAM: CT ABDOMEN AND PELVIS WITHOUT CONTRAST TECHNIQUE: Multidetector CT imaging of the abdomen and pelvis was performed following the standard protocol without IV contrast. COMPARISON:  05/13/2020, 05/12/2020, 07/20/2015 FINDINGS: Lower chest: There is a large multiloculated heterogeneous left pleural effusion,  with areas of higher attenuation seen anteriorly and posteriorly. There is complete atelectasis of the visualized left lung. Right lung is clear. The heart is unremarkable. Unenhanced CT was performed per clinician order. Lack of IV contrast limits sensitivity and specificity, especially for evaluation of abdominal/pelvic solid viscera. Hepatobiliary: No focal liver abnormality is seen. Status post cholecystectomy. No biliary dilatation. Pancreas: Unremarkable. No pancreatic ductal dilatation or surrounding inflammatory changes. Spleen: Indeterminate 1.3 cm hypodensity is seen within the posterior aspect of the spleen, image 37/2. Spleen is normal in size. Adrenals/Urinary Tract: No urinary tract calculi or obstructive uropathy within either kidney. The adrenals and bladder are unremarkable. Stomach/Bowel: No bowel obstruction or ileus. Normal appendix right lower quadrant. No bowel wall thickening or inflammatory change. Vascular/Lymphatic: Aortic atherosclerosis. No enlarged abdominal or pelvic lymph nodes. Reproductive: Status post hysterectomy. No adnexal masses. Other: No free fluid or free gas.  No abdominal wall hernia. Musculoskeletal: There are subacute healing right anterolateral fourth and fifth rib fractures with minimal callus formation. No acute bony abnormalities. Reconstructed images demonstrate no additional findings. IMPRESSION: 1. Large complex loculated left pleural effusion, with complete atelectasis of the visualized left lower lobe. Differential diagnosis would include empyema or sequela of hemothorax. Dedicated CT of the chest may be useful to visualize this abnormality in its entirety. 2. No acute intra-abdominal or intrapelvic process. Normal appendix right lower quadrant. 3. Indeterminate hypodensity posterior aspect of the spleen, which could reflect a small cyst or hemangioma. 4.  Aortic Atherosclerosis (ICD10-I70.0). Electronically Signed   By: Sharlet Salina M.D.   On: 06/06/2020 23:02    DG Chest 2 View  Result Date: 06/06/2020 CLINICAL DATA:  Large left effusion on recent CT of the abdomen EXAM: CHEST - 2 VIEW COMPARISON:  05/13/2020 FINDINGS: Delete opacification of the left hemithorax is noted. Cardiac shadow is stable. Right lung is clear. No acute bony abnormality is noted. IMPRESSION: Opacification of the left hemithorax consistent with the complex diffusion seen on recent CT of the abdomen. Electronically Signed   By: Alcide Clever M.D.   On: 06/06/2020 23:22   CT Chest Wo Contrast  Result Date: 06/07/2020 CLINICAL DATA:  Left pleural effusion, pneumonia, abnormal chest x-ray and abdominal CT EXAM: CT CHEST WITHOUT CONTRAST TECHNIQUE: Multidetector CT imaging of the chest was performed following the standard protocol without IV contrast. COMPARISON:  04/06/2010, 06/06/2020 FINDINGS: Cardiovascular: Unenhanced imaging of the heart and great vessels demonstrates no pericardial effusion. There is normal caliber of the thoracic aorta. Mild atherosclerosis of the aorta and coronary vessels. Mediastinum/Nodes: Chronic heterogeneous  enlargement of the right lobe of the thyroid has been unchanged since 2012, compatible with goiter. This is been previously evaluated by ultrasound. Trachea and esophagus are unremarkable. Subcentimeter mediastinal adenopathy identified, largest measuring 8 mm in the AP window. Lungs/Pleura: There is complete atelectasis of the left lung. Heterogeneous multilocular left pleural effusion is identified, occupying the entire left hemithorax. Areas of higher attenuation are seen within the fluid collection anteriorly and posteriorly at the level of the hilum. Scattered hypoventilatory changes are seen within the right lung. Otherwise the right hemithorax is clear. Central airways are patent. Upper Abdomen: No acute abnormality. Musculoskeletal: No acute or destructive bony lesions. Reconstructed images demonstrate no additional findings. IMPRESSION: 1. Large complex  multilocular left pleural effusion. This has increased in size since recent CT of the neck dated 05/12/2020. Differential includes empyema, sequela of previous hemothorax, or neoplasm. Correlation with cytology after thoracentesis may be useful. 2. Complete left lung atelectasis. 3. Stable heterogeneous enlargement right lobe thyroid compatible with goiter. This has been evaluated on previous imaging. Please refer to thyroid ultrasound dated 05/14/2020. (Ref: J Am Coll Radiol. 2015 Feb;12(2): 143-50). 4.  Aortic Atherosclerosis (ICD10-I70.0). Electronically Signed   By: Sharlet Salina M.D.   On: 06/07/2020 00:31   US Abdomen Complete  Result Date: 06/07/2020 CLINICAL DATA:  Abdominal and back pain EXAM: ABDOMEN ULTRASOUND COMPLETE COMPARISON:  06/06/2020 FINDINGS: Gallbladder: Surgically absent. Common bile duct: Diameter: 5 mm Liver: No focal lesion identified. Within normal limits in parenchymal echogenicity. Portal vein is patent on color Doppler imaging with normal direction of blood flow towards the liver. IVC: No abnormality visualized. Pancreas: Visualized portion unremarkable. Spleen: Normal size and appearance. Evaluation is limited due to overlying bandaging from left chest tube. The splenic hypodensity seen on CT is not visualized by ultrasound. Right Kidney: Length: 10.5 cm. Echogenicity within normal limits. No mass or hydronephrosis visualized. Left Kidney: Length: 10.9 cm. Echogenicity within normal limits. No mass or hydronephrosis visualized. Abdominal aorta: No aneurysm visualized. Other findings: None. IMPRESSION: 1. Grossly unremarkable abdominal ultrasound. 2. Limited evaluation of the spleen given overlying bandage material related to left-sided chest tube. The hypodensity seen on CT is not visualized by ultrasound. Electronically Signed   By: Sharlet Salina M.D.   On: 06/07/2020 21:55   DG CHEST PORT 1 VIEW  Result Date: 06/08/2020 CLINICAL DATA:  Pleural effusion EXAM: PORTABLE CHEST  1 VIEW COMPARISON:  CT chest dated 06/07/2020 FINDINGS: Near complete opacification of the left hemithorax, related to the patient's known complex pleural effusion/hemothorax. Mild lucency at the lung base with indwelling pigtail drainage catheter. Right lung is clear. The heart is partially obscured. IMPRESSION: Near complete opacification of the left hemithorax, now with mild lucency at the left lung base, with indwelling pigtail drainage catheter. Electronically Signed   By: Charline Bills M.D.   On: 06/08/2020 05:46   CT [facility] PLEURAL DRAIN W/INDWELL CATH W/IMG GUIDE  Result Date: 06/07/2020 INDICATION: Large left-sided pleural effusion, worrisome for hemothorax given history of recent CPR with subsequent initiation anticoagulation. Please perform image guided left-sided chest tube placement for therapeutic and diagnostic purposes. EXAM: CT PERC PLEURAL DRAIN W/INDWELL CATH W/IMG GUIDE COMPARISON:  Chest CT-06/07/2020 MEDICATIONS: The patient is currently admitted to the hospital and receiving intravenous antibiotics. The antibiotics were administered within an appropriate time frame prior to the initiation of the procedure. ANESTHESIA/SEDATION: Moderate (conscious) sedation was employed during this procedure. A total of Versed 2 mg and Fentanyl 100 mcg was administered intravenously. Moderate Sedation Time:  19 minutes. The patient's level of consciousness and vital signs were monitored continuously by radiology nursing throughout the procedure under my direct supervision. CONTRAST:  None COMPLICATIONS: None immediate. PROCEDURE: Informed written consent was obtained from the patient after a discussion of the risks, benefits and alternatives to treatment. The patient was placed supine, slightly RPO on the CT gantry and a pre procedural CT was performed re-demonstrating the known large likely partially loculated left-sided pleural effusion. The procedure was planned. A timeout was performed prior to the  initiation of the procedure. The skin overlying the inferolateral aspect of left chest was prepped and draped in the usual sterile fashion. The overlying soft tissues were anesthetized with 1% lidocaine with epinephrine. Appropriate trajectory was planned with the use of a 22 gauge spinal needle. An 18 gauge trocar needle was advanced into the abscess/fluid collection and a short Amplatz super stiff wire was coiled within the collection. Appropriate positioning was confirmed with a limited CT scan. The tract was serially dilated allowing placement of a 14 French all-purpose drainage catheter. Appropriate positioning was confirmed with a limited postprocedural CT scan. Approximately 950 cc of bloody fluid was aspirated. The tube was connected to a pleura vac device and sutured in place. A dressing was applied. The patient tolerated the procedure well without immediate post procedural complication. IMPRESSION: Successful CT guided placement of a 96 French all purpose drain catheter into the basilar aspect the left pleural space with aspiration of 950 cc of bloody fluid. Samples were sent to the laboratory as requested by the ordering clinical team. Electronically Signed   By: Simonne Come M.D.   On: 06/07/2020 15:16    Cardiac Studies    Assessment & Plan    Complex left-sided pleural effusion  Right greater than left-sided abdominal pain  Shortness of breath  Atypical chest pain  Cardiomyopathy presumed nonischemic.  Possibly rate related  Atrial arrhythmias-recurrent holding sinus rhythm  Amiodarone for the above  Bipolar/schizophrenia  Hyper kalemia ( previously) precluding guideline directed therapy   S/p thoracentesis >> bloody  Discussed with CCM ? Hemothorax 2/2 trauma 2/2 resuscitation and anticoagulation   Anticoagulation on hold, with increased risk of CVA post recent cardioversion, but the lesser of two...  W BP elevated will continue BB and Hydral/nitrates for CM;  increase hydralazine 25 q 8 >> 37.5 bid     Continue amio 200 bid for another 2 weeks then 200 mg daily  Check dig level   Resolution of abd pain and chest pain likely 2/2 effusion       For questions or updates, please contact [facility] Please consult [email or web address] for contact info under Cardiology/STEMI.      Signed, Sherryl Manges, MD  06/08/2020, 8:32 AM

## 2020-06-08 NOTE — Progress Notes (Addendum)
PROGRESS NOTE    [name] [name]  ZOX:[id number] DOB: [birth year] DOA: 06/06/2020 PCP: Dana Allan, MD (Confirm with patient/family/NH records and if not entered, this HAS to be entered at [facility] point of entry. "No PCP" if truly none.)   Chief Complaint  Patient presents with  . Nausea    Brief Narrative:  Patient 70 year old female history of CHF(EF<20%), paroxysmal A. fib on chronic anticoagulation, hypertension, hyperlipidemia, IDDM type II, bipolar disorder recently hospitalized for acute embolic stroke requiring tPA and mechanical thrombectomy with hospital course complicated by cardiogenic shock/PEA cardiac arrest immediately following thrombectomy.  During that hospitalization patient required milrinone infusion and diuretics.  Patient also required vasopressors was extubated and weaned off pressors on 05/13/2020.  Patient status post TEE/DCCV on 5 9 and discharged to [facility] 5/16.  Patient presented to the ED from SNF with right-sided abdominal pain, nausea vomiting, shortness of breath with speaking.  Patient noted to have a leukocytosis with a white count of 12.9, hemoglobin of 9.3, platelet count of 493.  Patient pancultured.  Chest x-ray showed complete opacification of the left hemithorax.  CT chest concerning for large complex multiloculated left pleural effusion increased in size since prior CT of 05/12/2020 with differential concerning for empyema versus sequelae of previous hemothorax versus neoplasm.  Patient placed empirically on IV vancomycin, cefepime, Flagyl.   IR consulted and pigtail catheter drain placed with 950 cc of bloody fluid aspirated.  PCCM consulted and patient with fibrinolysis 06/08/2020.  Cardiology also consulted for management of chronic complicated cardiac history.   Assessment & Plan:   Principal Problem:   Pleural effusion Active Problems:   HTN (hypertension)   Mixed hyperlipidemia   Uncontrolled type 2 diabetes mellitus with hyperglycemia (HCC)    A-fib (HCC)  1 large left multiloculated exudative pleural effusion -Differential including empyema versus sequelae of previous hemothorax versus neoplasm. -Patient presenting with abdominal pain/chest pain, shortness of breath with speaking. -Exudative pleural effusion by lights criteria. -Chest x-ray on admission with complete opacification of left hemithorax. -CT chest with large complex multilocular left pleural effusion increased in size since recent CT of the neck 05/12/2020. -Patient pancultured. -IR consulted and pigtail drain catheter placed with 950 cc of bloody aspirate noted. -PCCM/pulmonary consulted and patient subsequently underwent tPA/DNase today (pleural fibrinolysis day 1).  Repeat chest x-ray ordered for tomorrow. -Continue to hold anticoagulation of Eliquis. -Continue empiric IV vancomycin, IV cefepime, IV Flagyl. -PCCM/pulmonary following and appreciate input and recommendations.  2.  Right-sided abdominal pain/nausea and vomiting -Likely secondary to problem #1. -CT abdomen and pelvis with no acute intra-abdominal process. -Abdominal ultrasound unremarkable. -COVID-19 PCR negative, influenza a and B PCR negative. -Lipase within normal limits. -Patient with complaints of significant abdominal pain. -DC morphine. -IV Toradol 15 mg every 6 hours as needed pain. -Oxycodone as needed. -Supportive care.  3.  Chest pain -Likely secondary to problem #1. -Patient seen in consultation by cardiology who feel patient's chest pain syndrome is noncardiac with flattening troponins. -Per cardiology.  4.  Recent embolic CVA -Status post tPA and mechanical thrombectomy during prior hospitalization. -Eliquis has been discontinued secondary to bloody aspirate noted from pleural space with pigtail drain catheter.  5.  Paroxysmal atrial fibrillation/status post TEE/ DCCV 5/9 -In sinus rhythm -Continue amiodarone. -Eliquis discontinued secondary to problem #1. -Cardiology  following.  6.  QT prolongation -Concerning as patient noted to be on amiodarone. -Repeat EKG with QT prolongation. -Cardiology consulted and feel QT prolongation is normal on amiodarone.  7.  Nonischemic  cardiomyopathy/chronic systolic heart failure -Norvasc, digoxin, hydralazine, Imdur. -Not on spironolactone/ACE/ARB due to history of hyperkalemia. -Not on metoprolol due to allergy. -Not on Entresto or farxiga due to orthostasis -Per cardiology.  8.  Hypertension -Continue Norvasc. -Hydralazine and Imdur doses adjusted per cardiology for better blood pressure control.  9.  Hyperlipidemia -Statin.  10.  Poorly controlled insulin-dependent type 2 diabetes mellitus -Hemoglobin A1c 10.5 (05/13/2020) -CBG 70 this morning. -Continue current dose Lantus, SSI.   DVT prophylaxis: SCDs Code Status: Full Family Communication: Updated patient, updated daughter at bedside. Disposition:   Status is: Inpatient    Dispo: The patient is from: SNF([facility])              Anticipated d/c is to: TBD              Patient currently with multiloculated left pleural effusion status post pigtail catheter placement with bloody drainage, ongoing chest pain, not stable for discharge.   Difficult to place patient: Undetermined       Consultants:   Cardiology: Dr. Graciela Husbands 06/07/2020  PCCM/pulmonary: Dr. Francine Graven 06/07/2020  IR: Dr. Grace Isaac 06/07/2020  Procedures:   CT chest 06/07/2020  CT abdomen and pelvis 06/06/2020  Chest x-ray 06/06/2020, 06/08/2020  Abdominal ultrasound 06/07/2020  CT-guided placement of 14 French all-purpose drain catheter into left pleural space with aspiration of 950 cc of bloody fluid--- Per Dr. Grace Isaac, IR 06/07/2020  Pleural fibrinolysis Per PCCM, Dr. Francine Graven 06/08/2020  Antimicrobials:   IV cefepime 06/07/2020>>>  IV Flagyl 06/07/2020>>>>  IV vancomycin 06/07/2020>>>>>   Subjective: Laying in bed.  States overall feels a little bit better than she did on  admission.  Feels some improvement with shortness of breath after pigtail catheter placed.  Still with complaints of chest pain around drain's insertion site.  Still with complaints of upper abdominal pain.  No nausea or emesis.  Daughter at bedside.  Objective: Vitals:   06/08/20 0230 06/08/20 0500 06/08/20 0656 06/08/20 0952  BP: (!) 176/88  (!) 173/88 (!) 163/86  Pulse: 80  79 88  Resp: 20  18   Temp: 97.8 F (36.6 C)  97.7 F (36.5 C)   TempSrc: Oral  Oral   SpO2: 100%  (!) 87%   Weight:  81.5 kg      Intake/Output Summary (Last 24 hours) at 06/08/2020 1251 Last data filed at 06/07/2020 1832 Gross per 24 hour  Intake 199.21 ml  Output --  Net 199.21 ml   Filed Weights   06/08/20 0500  Weight: 81.5 kg    Examination:  General exam: Appears calm and comfortable  Respiratory system: Shallow breath sounds.  CTA B anterior lung fields.  No wheezing.  No crackles.  Cardiovascular system: S1 & S2 heard, RRR. No JVD, murmurs, rubs, gallops or clicks. No pedal edema.  Pigtail catheter noted left chest wall tender to palpation around insertion site. Gastrointestinal system: Abdomen is nondistended, soft and tender to palpation in the epigastrium and right upper quadrant.  Positive bowel sounds.  No rebound.  No guarding.  Central nervous system: Alert and oriented. No focal neurological deficits. Extremities: Symmetric 5 x 5 power. Skin: No rashes, lesions or ulcers Psychiatry: Judgement and insight appear normal. Mood & affect appropriate.     Data Reviewed: I have personally reviewed following labs and imaging studies  CBC: Recent Labs  Lab 06/06/20 2137 06/07/20 0823 06/08/20 0438  WBC 12.9* 11.9* 10.8*  NEUTROABS 10.7* 10.0* 8.5*  HGB 9.3* 9.1* 9.4*  HCT 30.5* 29.8*  31.3*  MCV 82.2 82.1 83.2  PLT 493* 481* 556*    Basic Metabolic Panel: Recent Labs  Lab 06/06/20 2137 06/07/20 0735 06/07/20 0823 06/08/20 0438  NA 138  --  137 140  K 4.7  --  4.8 4.5  CL 98   --  100 104  CO2 37*  --  33* 32  GLUCOSE 197*  --  178* 78  BUN 28*  --  27* 26*  CREATININE 0.80  --  0.85 0.74  CALCIUM 8.9  --  8.4* 8.8*  MG  --  2.1 1.9 2.1    GFR: Estimated Creatinine Clearance: 68 mL/min (by C-G formula based on SCr of 0.74 mg/dL).  Liver Function Tests: Recent Labs  Lab 06/06/20 2137 06/07/20 0823 06/08/20 0438  AST 66* 47* 39  ALT 34 32 30  ALKPHOS 168* 155* 156*  BILITOT 0.5 0.9 0.7  PROT 7.5 7.2 7.4  7.3  ALBUMIN 2.2* 2.1* 2.3*    CBG: Recent Labs  Lab 06/07/20 1338 06/07/20 1755 06/07/20 2212 06/08/20 0949 06/08/20 1132  GLUCAP 150* 113* 110* 70 177*     Recent Results (from the past 240 hour(s))  Culture, blood (routine x 2)     Status: None (Preliminary result)   Collection Time: 06/07/20  2:10 AM   Specimen: BLOOD  Result Value Ref Range Status   Specimen Description   Final    BLOOD LEFT ANTECUBITAL Performed at [facility], [address] [place], [place] [address]    Special Requests   Final    BOTTLES DRAWN AEROBIC AND ANAEROBIC Blood Culture adequate volume Performed at [facility], [address], [place], [place] [address]    Culture   Final    NO GROWTH < 24 HOURS Performed at [facility], [address] [place], [place] [address]    Report Status PENDING  Incomplete  Culture, blood (Routine X 2) w Reflex to ID Panel     Status: None (Preliminary result)   Collection Time: 06/07/20  2:15 AM   Specimen: BLOOD  Result Value Ref Range Status   Specimen Description   Final    BLOOD BLOOD RIGHT HAND Performed at [facility], [address], [place], [place] [address]    Special Requests   Final    BOTTLES DRAWN AEROBIC ONLY Blood Culture adequate volume Performed at [facility], [address] [place], [place] [address]    Culture   Final    NO GROWTH < 24 HOURS Performed at [facility], [address]  [place], [place] [address]    Report Status PENDING  Incomplete  Resp Panel by RT-PCR (Flu A&B, Covid) Nasopharyngeal Swab     Status: None   Collection Time: 06/07/20  2:26 AM   Specimen: Nasopharyngeal Swab; Nasopharyngeal(NP) swabs in vial transport medium  Result Value Ref Range Status   SARS Coronavirus 2 by RT PCR NEGATIVE NEGATIVE Final    Comment: (NOTE) SARS-CoV-2 target nucleic acids are NOT DETECTED.  The SARS-CoV-2 RNA is generally detectable in upper respiratory specimens during the acute phase of infection. The lowest concentration of SARS-CoV-2 viral copies this assay can detect is 138 copies/mL. A negative result does not preclude SARS-Cov-2 infection and should not be used as the sole basis for treatment or other patient management decisions. A negative result may occur with  improper specimen collection/handling, submission of specimen other than nasopharyngeal swab, presence of  viral mutation(s) within the areas targeted by this assay, and inadequate number of viral copies(<138 copies/mL). A negative result must be combined with clinical observations, patient history, and epidemiological information. The expected result is Negative.  Fact Sheet for Patients:  BloggerCourse.com  Fact Sheet for Healthcare Providers:  SeriousBroker.it  This test is no t yet approved or cleared by the [place] FDA and  has been authorized for detection and/or diagnosis of SARS-CoV-2 by FDA under an Emergency Use Authorization (EUA). This EUA will remain  in effect (meaning this test can be used) for the duration of the COVID-19 declaration under Section 564(b)(1) of the Act, 21 U.S.C.section 360bbb-3(b)(1), unless the authorization is terminated  or revoked sooner.       Influenza A by PCR NEGATIVE NEGATIVE Final   Influenza B by PCR NEGATIVE NEGATIVE Final    Comment: (NOTE) The Xpert Xpress SARS-CoV-2/FLU/RSV plus assay is  intended as an aid in the diagnosis of influenza from Nasopharyngeal swab specimens and should not be used as a sole basis for treatment. Nasal washings and aspirates are unacceptable for Xpert Xpress SARS-CoV-2/FLU/RSV testing.  Fact Sheet for Patients: BloggerCourse.com  Fact Sheet for Healthcare Providers: SeriousBroker.it  This test is not yet approved or cleared by the [place] FDA and has been authorized for detection and/or diagnosis of SARS-CoV-2 by FDA under an Emergency Use Authorization (EUA). This EUA will remain in effect (meaning this test can be used) for the duration of the COVID-19 declaration under Section 564(b)(1) of the Act, 21 U.S.C. section 360bbb-3(b)(1), unless the authorization is terminated or revoked.  Performed at [facility], [address], [place], [place] [address]   Culture, Urine     Status: Abnormal   Collection Time: 06/07/20  6:38 AM   Specimen: Urine, Random  Result Value Ref Range Status   Specimen Description   Final    URINE, RANDOM Performed at [facility], [address] [place], [place] [address]    Special Requests   Final    NONE Performed at [facility], [address] [place], [place] [address]    Culture MULTIPLE SPECIES PRESENT, SUGGEST RECOLLECTION (A)  Final   Report Status 06/08/2020 FINAL  Final  Body fluid culture w Gram Stain     Status: None (Preliminary result)   Collection Time: 06/07/20 12:44 PM   Specimen: Pleura  Result Value Ref Range Status   Specimen Description   Final    PLEURAL LEFT Performed at [facility], [address], [place], [place] [address]    Special Requests   Final    NONE Performed at [facility], [address], [place], [place] [address]    Gram Stain   Final    RARE WBC PRESENT,BOTH PMN AND MONONUCLEAR NO ORGANISMS SEEN     Culture   Final    NO GROWTH < 24 HOURS Performed at [facility], [address], [place], [place] [address]    Report Status PENDING  Incomplete         Radiology Studies: CT Abdomen Pelvis Wo Contrast  Result Date: 06/06/2020 CLINICAL DATA:  Right lower quadrant abdominal pain, nausea EXAM: CT ABDOMEN AND PELVIS WITHOUT CONTRAST TECHNIQUE: Multidetector CT imaging of the abdomen and pelvis was performed following the standard protocol without IV contrast. COMPARISON:  05/13/2020, 05/12/2020, 07/20/2015 FINDINGS: Lower chest: There is a large multiloculated heterogeneous left pleural effusion, with areas of higher attenuation  seen anteriorly and posteriorly. There is complete atelectasis of the visualized left lung. Right lung is clear. The heart is unremarkable. Unenhanced CT was performed per clinician order. Lack of IV contrast limits sensitivity and specificity, especially for evaluation of abdominal/pelvic solid viscera. Hepatobiliary: No focal liver abnormality is seen. Status post cholecystectomy. No biliary dilatation. Pancreas: Unremarkable. No pancreatic ductal dilatation or surrounding inflammatory changes. Spleen: Indeterminate 1.3 cm hypodensity is seen within the posterior aspect of the spleen, image 37/2. Spleen is normal in size. Adrenals/Urinary Tract: No urinary tract calculi or obstructive uropathy within either kidney. The adrenals and bladder are unremarkable. Stomach/Bowel: No bowel obstruction or ileus. Normal appendix right lower quadrant. No bowel wall thickening or inflammatory change. Vascular/Lymphatic: Aortic atherosclerosis. No enlarged abdominal or pelvic lymph nodes. Reproductive: Status post hysterectomy. No adnexal masses. Other: No free fluid or free gas.  No abdominal wall hernia. Musculoskeletal: There are subacute healing right anterolateral fourth and fifth rib fractures with minimal callus formation. No acute bony abnormalities. Reconstructed images  demonstrate no additional findings. IMPRESSION: 1. Large complex loculated left pleural effusion, with complete atelectasis of the visualized left lower lobe. Differential diagnosis would include empyema or sequela of hemothorax. Dedicated CT of the chest may be useful to visualize this abnormality in its entirety. 2. No acute intra-abdominal or intrapelvic process. Normal appendix right lower quadrant. 3. Indeterminate hypodensity posterior aspect of the spleen, which could reflect a small cyst or hemangioma. 4.  Aortic Atherosclerosis (ICD10-I70.0). Electronically Signed   By: Sharlet Salina M.D.   On: 06/06/2020 23:02   DG Chest 2 View  Result Date: 06/06/2020 CLINICAL DATA:  Large left effusion on recent CT of the abdomen EXAM: CHEST - 2 VIEW COMPARISON:  05/13/2020 FINDINGS: Delete opacification of the left hemithorax is noted. Cardiac shadow is stable. Right lung is clear. No acute bony abnormality is noted. IMPRESSION: Opacification of the left hemithorax consistent with the complex diffusion seen on recent CT of the abdomen. Electronically Signed   By: Alcide Clever M.D.   On: 06/06/2020 23:22   CT Chest Wo Contrast  Result Date: 06/07/2020 CLINICAL DATA:  Left pleural effusion, pneumonia, abnormal chest x-ray and abdominal CT EXAM: CT CHEST WITHOUT CONTRAST TECHNIQUE: Multidetector CT imaging of the chest was performed following the standard protocol without IV contrast. COMPARISON:  04/06/2010, 06/06/2020 FINDINGS: Cardiovascular: Unenhanced imaging of the heart and great vessels demonstrates no pericardial effusion. There is normal caliber of the thoracic aorta. Mild atherosclerosis of the aorta and coronary vessels. Mediastinum/Nodes: Chronic heterogeneous enlargement of the right lobe of the thyroid has been unchanged since 2012, compatible with goiter. This is been previously evaluated by ultrasound. Trachea and esophagus are unremarkable. Subcentimeter mediastinal adenopathy identified, largest  measuring 8 mm in the AP window. Lungs/Pleura: There is complete atelectasis of the left lung. Heterogeneous multilocular left pleural effusion is identified, occupying the entire left hemithorax. Areas of higher attenuation are seen within the fluid collection anteriorly and posteriorly at the level of the hilum. Scattered hypoventilatory changes are seen within the right lung. Otherwise the right hemithorax is clear. Central airways are patent. Upper Abdomen: No acute abnormality. Musculoskeletal: No acute or destructive bony lesions. Reconstructed images demonstrate no additional findings. IMPRESSION: 1. Large complex multilocular left pleural effusion. This has increased in size since recent CT of the neck dated 05/12/2020. Differential includes empyema, sequela of previous hemothorax, or neoplasm. Correlation with cytology after thoracentesis may be useful. 2. Complete left lung atelectasis. 3. Stable heterogeneous enlargement right lobe  thyroid compatible with goiter. This has been evaluated on previous imaging. Please refer to thyroid ultrasound dated 05/14/2020. (Ref: J Am Coll Radiol. 2015 Feb;12(2): 143-50). 4.  Aortic Atherosclerosis (ICD10-I70.0). Electronically Signed   By: Sharlet Salina M.D.   On: 06/07/2020 00:31   US Abdomen Complete  Result Date: 06/07/2020 CLINICAL DATA:  Abdominal and back pain EXAM: ABDOMEN ULTRASOUND COMPLETE COMPARISON:  06/06/2020 FINDINGS: Gallbladder: Surgically absent. Common bile duct: Diameter: 5 mm Liver: No focal lesion identified. Within normal limits in parenchymal echogenicity. Portal vein is patent on color Doppler imaging with normal direction of blood flow towards the liver. IVC: No abnormality visualized. Pancreas: Visualized portion unremarkable. Spleen: Normal size and appearance. Evaluation is limited due to overlying bandaging from left chest tube. The splenic hypodensity seen on CT is not visualized by ultrasound. Right Kidney: Length: 10.5 cm.  Echogenicity within normal limits. No mass or hydronephrosis visualized. Left Kidney: Length: 10.9 cm. Echogenicity within normal limits. No mass or hydronephrosis visualized. Abdominal aorta: No aneurysm visualized. Other findings: None. IMPRESSION: 1. Grossly unremarkable abdominal ultrasound. 2. Limited evaluation of the spleen given overlying bandage material related to left-sided chest tube. The hypodensity seen on CT is not visualized by ultrasound. Electronically Signed   By: Sharlet Salina M.D.   On: 06/07/2020 21:55   DG CHEST PORT 1 VIEW  Result Date: 06/08/2020 CLINICAL DATA:  Pleural effusion EXAM: PORTABLE CHEST 1 VIEW COMPARISON:  CT chest dated 06/07/2020 FINDINGS: Near complete opacification of the left hemithorax, related to the patient's known complex pleural effusion/hemothorax. Mild lucency at the lung base with indwelling pigtail drainage catheter. Right lung is clear. The heart is partially obscured. IMPRESSION: Near complete opacification of the left hemithorax, now with mild lucency at the left lung base, with indwelling pigtail drainage catheter. Electronically Signed   By: Charline Bills M.D.   On: 06/08/2020 05:46   CT [facility] PLEURAL DRAIN W/INDWELL CATH W/IMG GUIDE  Result Date: 06/07/2020 INDICATION: Large left-sided pleural effusion, worrisome for hemothorax given history of recent CPR with subsequent initiation anticoagulation. Please perform image guided left-sided chest tube placement for therapeutic and diagnostic purposes. EXAM: CT PERC PLEURAL DRAIN W/INDWELL CATH W/IMG GUIDE COMPARISON:  Chest CT-06/07/2020 MEDICATIONS: The patient is currently admitted to the hospital and receiving intravenous antibiotics. The antibiotics were administered within an appropriate time frame prior to the initiation of the procedure. ANESTHESIA/SEDATION: Moderate (conscious) sedation was employed during this procedure. A total of Versed 2 mg and Fentanyl 100 mcg was administered  intravenously. Moderate Sedation Time: 19 minutes. The patient's level of consciousness and vital signs were monitored continuously by radiology nursing throughout the procedure under my direct supervision. CONTRAST:  None COMPLICATIONS: None immediate. PROCEDURE: Informed written consent was obtained from the patient after a discussion of the risks, benefits and alternatives to treatment. The patient was placed supine, slightly RPO on the CT gantry and a pre procedural CT was performed re-demonstrating the known large likely partially loculated left-sided pleural effusion. The procedure was planned. A timeout was performed prior to the initiation of the procedure. The skin overlying the inferolateral aspect of left chest was prepped and draped in the usual sterile fashion. The overlying soft tissues were anesthetized with 1% lidocaine with epinephrine. Appropriate trajectory was planned with the use of a 22 gauge spinal needle. An 18 gauge trocar needle was advanced into the abscess/fluid collection and a short Amplatz super stiff wire was coiled within the collection. Appropriate positioning was confirmed with a limited CT scan.  The tract was serially dilated allowing placement of a 14 French all-purpose drainage catheter. Appropriate positioning was confirmed with a limited postprocedural CT scan. Approximately 950 cc of bloody fluid was aspirated. The tube was connected to a pleura vac device and sutured in place. A dressing was applied. The patient tolerated the procedure well without immediate post procedural complication. IMPRESSION: Successful CT guided placement of a 5414 French all purpose drain catheter into the basilar aspect the left pleural space with aspiration of 950 cc of bloody fluid. Samples were sent to the laboratory as requested by the ordering clinical team. Electronically Signed   By: Simonne ComeJohn  Watts M.D.   On: 06/07/2020 15:16        Scheduled Meds: . amiodarone  200 mg Oral BID  .  amLODipine  5 mg Oral Daily  . atorvastatin  80 mg Oral Daily  . digoxin  0.125 mg Oral Daily  . ferrous gluconate  324 mg Oral Daily  . hydrALAZINE  50 mg Oral BID  . insulin aspart  0-5 Units Subcutaneous QHS  . insulin aspart  0-9 Units Subcutaneous TID WC  . insulin glargine  15 Units Subcutaneous Daily  . isosorbide mononitrate  15 mg Oral BID  . magnesium oxide  400 mg Oral Daily  . scopolamine  1 patch Transdermal Q72H  . senna-docusate  1 tablet Oral BID   Continuous Infusions: . ceFEPime (MAXIPIME) IV 2 g (06/08/20 1117)  . metronidazole 500 mg (06/08/20 1210)  . vancomycin 1,000 mg (06/08/20 0001)     LOS: 1 day    Time spent: 40 minutes    Ramiro Harvestaniel Kei Mcelhiney, MD [facility]   To contact the attending provider between 7A-7P or the covering provider during after hours 7P-7A, please log into the web site [email or web address] and access using universal [place] password for that web site. If you do not have the password, please call the hospital operator.  06/08/2020, 12:51 PM

## 2020-06-08 NOTE — Progress Notes (Signed)
NAME:  [name] [name], MRN:  [id number], DOB:  [birth year], LOS: 1 ADMISSION DATE:  06/06/2020, CONSULTATION DATE:  06/07/20 REFERRING MD:  Ramiro Harvest, MD CHIEF COMPLAINT:  Pleural effusion  History of Present Illness:  [name] [name] is a 70 year old woman with CHF, paroxysmal atrial fibrillation, and DMII who was recently admitted 5/2 to 5/16 for acute embolic stroke due to PAG s/p TPA therapy and mechanical thrombectomy. Her course was complicated by cardiac arrest and cardiogenic shock. She required milrinone drip and cardioversion for the atrial fibrillation. She has been eliquis for anticoagulation after the stroke. She was sent to [facility] place on 5/16 where she presented from with right sided abdominal pain, nausea and vomiting. She also complained of some shortness of breath and has been on 3L of O2 since being at [place].   Work up in the ED revealed left hemithorax opacification based on chest radiograph and CT chest showed loculated pleural effusion involving the left hemithorax.   IR was consulted for chest tube placement and a 71fr pigtail catheter was placed via CT guidance with of bloody appearing fluid removed initially.   PCCM was consulted for further evaluation and treatment of the left pleural effusion.   Pleural fluid studies show LDH 1636, total protein 3.1, glucose 134, albumin 1.5. Cell count is pending at this time.   Pertinent  Medical History  Atrial Fibrillation Heart Failure DMII  Significant Hospital Events: Including procedures, antibiotic start and stop dates in addition to other pertinent events   . 5/28 admitted to [facility], 58fr chest tube placed by IR  Interim History / Subjective:   Patient is feeling better this morning. Her breathing is more comfortable and her abdominal pain is improved.   Her daughter is at the bedside.   First dose of TPA/DNAse instilled at 9:45am  No further chest tube output since placement yesterday. Chest  radiograph with continue loculated effusion with improved aeration of the left lung base where pigtail catheter is located.  Objective   Blood pressure (!) 163/86, pulse 88, temperature 97.7 F (36.5 C), temperature source Oral, resp. rate 18, weight 81.5 kg, SpO2 (!) 87 %.        Intake/Output Summary (Last 24 hours) at 06/08/2020 1016 Last data filed at 06/07/2020 1832 Gross per 24 hour  Intake 199.21 ml  Output 900 ml  Net -700.79 ml   Filed Weights   06/08/20 0500  Weight: 81.5 kg    Examination: General: Elderly woman, no acute distress HENT: [place]/AT, moist mucous membranes, sclera anicteric Lungs: Diminished breath sounds on the left.  Clear to auscultation on the right.  Left pigtail catheter in place with bloody drainage. Cardiovascular: Regular rate and rhythm, no murmurs Abdomen: Soft, non-tender, bowel sounds present Extremities: Warm, no edema Neuro: Alert, oriented x3, moving all extremities. GU: Deferred  Labs/imaging that I have personally reviewed  (right click and "Reselect all SmartList Selections" daily)  5/27 CT Abdomen 5/28 CT Chest 5/29 CXR -  loculated effusion with improved aeration of the left lung base where pigtail catheter is located  BNP 707, troponin 35, LDH 496, total protein 7.2, creatinine 0.85 CBC: WBC 11.9, hemoglobin 9.1 (Hgb on 05/13/20 was 12)  Pleural fluid: albumin 1.5, LDH 1636, total protein 3.1, cell count 67% lymph  Resolved Hospital Problem list     Assessment & Plan:   Left complicated pleural effusion Based on review of chest imaging the pleural effusion is new since 01/2020 and first noted on  chest radiograph 05/13/20.  Pleural fluid studies indicate exudative process based on LDH ratio. The etiology of the pleural effusion includes parapneumonic effusion vs empyema vs hemothorax s/p resuscitation vs malignancy. - f/u cytology and cultures. - TPA/DNAse therapy initiated this morning for remaining loculations noted on chest  radiograph this morning.   - Hold eliquis therapy for now and will discuss starting heparin drip for anticoagulation needs. Increased risk of bleeding with the TPA/DNAse therapy while on anticoagulation.  - Continue chest tube to -20 cmH2O - agree with broad spectrum antibiotics for now   PCCM will continue to follow.   Labs   CBC: Recent Labs  Lab 06/06/20 2137 06/07/20 0823 06/08/20 0438  WBC 12.9* 11.9* 10.8*  NEUTROABS 10.7* 10.0* 8.5*  HGB 9.3* 9.1* 9.4*  HCT 30.5* 29.8* 31.3*  MCV 82.2 82.1 83.2  PLT 493* 481* 556*    Basic Metabolic Panel: Recent Labs  Lab 06/06/20 2137 06/07/20 0735 06/07/20 0823 06/08/20 0438  NA 138  --  137 140  K 4.7  --  4.8 4.5  CL 98  --  100 104  CO2 37*  --  33* 32  GLUCOSE 197*  --  178* 78  BUN 28*  --  27* 26*  CREATININE 0.80  --  0.85 0.74  CALCIUM 8.9  --  8.4* 8.8*  MG  --  2.1 1.9 2.1   GFR: Estimated Creatinine Clearance: 68 mL/min (by C-G formula based on SCr of 0.74 mg/dL). Recent Labs  Lab 06/06/20 2137 06/07/20 0823 06/08/20 0438  WBC 12.9* 11.9* 10.8*    Liver Function Tests: Recent Labs  Lab 06/06/20 2137 06/07/20 0823 06/08/20 0438  AST 66* 47* 39  ALT 34 32 30  ALKPHOS 168* 155* 156*  BILITOT 0.5 0.9 0.7  PROT 7.5 7.2 7.4  7.3  ALBUMIN 2.2* 2.1* 2.3*   Recent Labs  Lab 06/06/20 2137  LIPASE 35   No results for input(s): AMMONIA in the last 168 hours.  ABG    Component Value Date/Time   PHART 7.401 05/12/2020 1602   PCO2ART 37.6 05/12/2020 1602   PO2ART 349 (H) 05/12/2020 1602   HCO3 23.5 05/12/2020 1602   TCO2 25 05/12/2020 1602   ACIDBASEDEF 1.0 05/12/2020 1602   O2SAT 73.7 05/20/2020 0520     Coagulation Profile: Recent Labs  Lab 06/07/20 1533  INR 1.8*    Cardiac Enzymes: No results for input(s): CKTOTAL, CKMB, CKMBINDEX, TROPONINI in the last 168 hours.  HbA1C: HbA1c, POC (controlled diabetic range)  Date/Time Value Ref Range Status  12/30/2017 10:02 AM 9.4 (A) 0.0 -  7.0 % Final  09/06/2017 01:31 PM 8.8 (A) 0.0 - 7.0 % Final   HbA1c POC (<> result, manual entry)  Date/Time Value Ref Range Status  07/26/2019 11:08 AM >15.0 4.0 - 5.6 % Final   Hgb A1c MFr Bld  Date/Time Value Ref Range Status  05/13/2020 05:27 AM 10.5 (H) 4.8 - 5.6 % Final    Comment:    (NOTE) Pre diabetes:          5.7%-6.4%  Diabetes:              >6.4%  Glycemic control for   <7.0% adults with diabetes   02/04/2020 06:57 AM 10.4 (H) 4.8 - 5.6 % Final    Comment:    (NOTE) Pre diabetes:          5.7%-6.4%  Diabetes:              >  6.4%  Glycemic control for   <7.0% adults with diabetes     CBG: Recent Labs  Lab 06/07/20 0805 06/07/20 1338 06/07/20 1755 06/07/20 2212 06/08/20 0949  GLUCAP 186* 150* 113* 110* 70    Critical care time: n/a    Melody Comas, MD [facility] Office: [phone]   See Amion for personal pager PCCM on call pager [phone] until 7pm. Please call Elink 7p-7a. [phone]

## 2020-06-08 NOTE — Procedures (Signed)
Pleural Fibrinolytic Administration Procedure Note  [name] [name]  [id number]  June 22, 1950  Date:06/08/20  Time:10:22 AM   Provider Performing:Kaloni Bisaillon B Sahiba Granholm   Procedure: Pleural Fibrinolysis Initial day [phone])  Indication(s) Fibrinolysis of complicated pleural effusion  Consent Risks of the procedure as well as the alternatives and risks of each were explained to the patient and/or caregiver.  Consent for the procedure was obtained.   Anesthesia None   Time Out Verified patient identification, verified procedure, site/side was marked, verified correct patient position, special equipment/implants available, medications/allergies/relevant history reviewed, required imaging and test results available.   Sterile Technique Hand hygiene, gloves   Procedure Description Existing pleural catheter was cleaned and accessed in sterile manner.  10mg  of tPA in 30cc of saline and 5mg  of dornase in 30cc of sterile water were injected into pleural space using existing pleural catheter.  Catheter will be clamped for 1 hour and then placed back to suction.   Complications/Tolerance None; patient tolerated the procedure well.  EBL None   Specimen(s) None

## 2020-06-09 ENCOUNTER — Inpatient Hospital Stay (HOSPITAL_COMMUNITY): Payer: Medicare Other

## 2020-06-09 DIAGNOSIS — J90 Pleural effusion, not elsewhere classified: Secondary | ICD-10-CM | POA: Diagnosis not present

## 2020-06-09 LAB — BASIC METABOLIC PANEL
Anion gap: 5 (ref 5–15)
BUN: 27 mg/dL — ABNORMAL HIGH (ref 8–23)
CO2: 30 mmol/L (ref 22–32)
Calcium: 8.6 mg/dL — ABNORMAL LOW (ref 8.9–10.3)
Chloride: 102 mmol/L (ref 98–111)
Creatinine, Ser: 0.76 mg/dL (ref 0.44–1.00)
GFR, Estimated: >60 mL/min (ref >60)
Glucose, Bld: 170 mg/dL — ABNORMAL HIGH (ref 70–99)
Potassium: 5 mmol/L (ref 3.5–5.1)
Sodium: 137 mmol/L (ref 135–145)

## 2020-06-09 LAB — CBC WITH DIFFERENTIAL/PLATELET
Abs Immature Granulocytes: 0.04 10*3/uL (ref 0.00–0.07)
Basophils Absolute: 0.1 10*3/uL (ref 0.0–0.1)
Basophils Relative: 1 %
Eosinophils Absolute: 0.1 10*3/uL (ref 0.0–0.5)
Eosinophils Relative: 1 %
HCT: 35.7 % — ABNORMAL LOW (ref 36.0–46.0)
Hemoglobin: 10.6 g/dL — ABNORMAL LOW (ref 12.0–15.0)
Immature Granulocytes: 0 %
Lymphocytes Relative: 9 %
Lymphs Abs: 0.9 10*3/uL (ref 0.7–4.0)
MCH: 25 pg — ABNORMAL LOW (ref 26.0–34.0)
MCHC: 29.7 g/dL — ABNORMAL LOW (ref 30.0–36.0)
MCV: 84.2 fL (ref 80.0–100.0)
Monocytes Absolute: 1.2 10*3/uL — ABNORMAL HIGH (ref 0.1–1.0)
Monocytes Relative: 12 %
Neutro Abs: 7.6 10*3/uL (ref 1.7–7.7)
Neutrophils Relative %: 77 %
Platelets: 586 10*3/uL — ABNORMAL HIGH (ref 150–400)
RBC: 4.24 MIL/uL (ref 3.87–5.11)
RDW: 18.7 % — ABNORMAL HIGH (ref 11.5–15.5)
WBC: 10 10*3/uL (ref 4.0–10.5)
nRBC: 0 % (ref 0.0–0.2)

## 2020-06-09 LAB — MRSA PCR SCREENING: MRSA by PCR: NEGATIVE

## 2020-06-09 LAB — GLUCOSE, CAPILLARY
Glucose-Capillary: 188 mg/dL — ABNORMAL HIGH (ref 70–99)
Glucose-Capillary: 156 mg/dL — ABNORMAL HIGH (ref 70–99)
Glucose-Capillary: 252 mg/dL — ABNORMAL HIGH (ref 70–99)
Glucose-Capillary: 268 mg/dL — ABNORMAL HIGH (ref 70–99)
Glucose-Capillary: 314 mg/dL — ABNORMAL HIGH (ref 70–99)

## 2020-06-09 LAB — MAGNESIUM: Magnesium: 1.9 mg/dL (ref 1.7–2.4)

## 2020-06-09 MED ORDER — SORBITOL 70 % SOLN
960.0000 mL | TOPICAL_OIL | Freq: Once | ORAL | Status: AC
  Administered 2020-06-10: 960 mL via RECTAL
  Filled 2020-06-09 (×2): qty 473

## 2020-06-09 MED ORDER — ISOSORBIDE MONONITRATE ER 60 MG PO TB24
60.0000 mg | ORAL_TABLET | Freq: Every day | ORAL | Status: DC
  Administered 2020-06-09 – 2020-06-10 (×2): 60 mg via ORAL
  Filled 2020-06-09 (×2): qty 1

## 2020-06-09 MED ORDER — HYDROMORPHONE HCL 1 MG/ML IJ SOLN
0.5000 mg | INTRAMUSCULAR | Status: DC | PRN
  Administered 2020-06-09 – 2020-06-15 (×12): 0.5 mg via INTRAVENOUS
  Filled 2020-06-09 (×13): qty 0.5

## 2020-06-09 MED ORDER — SODIUM CHLORIDE (PF) 0.9 % IJ SOLN
10.0000 mg | Freq: Once | INTRAMUSCULAR | Status: AC
  Administered 2020-06-09: 10 mg via INTRAPLEURAL
  Filled 2020-06-09: qty 10

## 2020-06-09 MED ORDER — HYDRALAZINE HCL 50 MG PO TABS
50.0000 mg | ORAL_TABLET | Freq: Three times a day (TID) | ORAL | Status: DC
  Administered 2020-06-09 – 2020-06-10 (×3): 50 mg via ORAL
  Filled 2020-06-09 (×3): qty 1

## 2020-06-09 MED ORDER — STERILE WATER FOR INJECTION IJ SOLN
5.0000 mg | Freq: Once | RESPIRATORY_TRACT | Status: AC
  Administered 2020-06-09: 5 mg via INTRAPLEURAL
  Filled 2020-06-09: qty 5

## 2020-06-09 NOTE — Care Management Important Message (Signed)
Medicare IM given to the patient by Aamina Skiff. 

## 2020-06-09 NOTE — Evaluation (Signed)
Physical Therapy Evaluation Patient Details Name: [name] [name] MRN: [id number] DOB: 07/04/1950 Today's Date: 06/09/2020   History of Present Illness  70 yo female admited with "large/complicated" pleural effusion, L hemithorax s/p chest tube placement. Recent last admit-(CVA, post procedure cardiac arrest, cardiogenic shock, DCCV). Hx of DM, Afib, COVID, schizophrenia, bipolar d/o, CHF  Clinical Impression  On eval, pt required Mod assist (+2 for safety/lines/equipment). She was able to stand and pivot to recliner with increased time and encouragement. Mobility is limited by pain-back pain specifically. Pt rated pain 8/10. Recommendation is for pt to return to SNF to continue rehab. Will follow and progress activity as tolerated.     Follow Up Recommendations SNF    Equipment Recommendations  Rolling walker with 5" wheels;3in1 (PT)    Recommendations for Other Services       Precautions / Restrictions Precautions Precautions: Fall Precaution Comments: chest tube; back pain Restrictions Weight Bearing Restrictions: No      Mobility  Bed Mobility Overal bed mobility: Needs Assistance Bed Mobility: Supine to Sit     Supine to sit: Mod assist;HOB elevated     General bed mobility comments: Assist for trunk and LEs. Utilized bedpad to help with positioning, scooting. Increased time. Cues for safety, technique. Back pain complicating task.    Transfers Overall transfer level: Needs assistance Equipment used: Rolling walker (2 wheeled) Transfers: Sit to/from Stand Sit to Stand: Mod assist;From elevated surface         General transfer comment: Assist to power up, stabilize, control descent. Cues for safety, technique, hand placement. Stand pivot, bed to recliner, with RW.  Ambulation/Gait             General Gait Details: NT 2* back pain and +2 assist not available  Stairs            Wheelchair Mobility    Modified Rankin (Stroke Patients Only)        Balance Overall balance assessment: Needs assistance         Standing balance support: Bilateral upper extremity supported Standing balance-Leahy Scale: Poor                               Pertinent Vitals/Pain Pain Assessment: Faces Pain Score: 8  Pain Location: back Pain Descriptors / Indicators: Grimacing;Moaning;Discomfort;Guarding Pain Intervention(s): Limited activity within patient's tolerance;Monitored during session;Repositioned    Home Living Family/patient expects to be discharged to:: Skilled nursing facility               Home Equipment: [employer] Mor - single point      Prior Function           Comments: Independent prior to recent hospital amission 5/2-5/16. Requiring assistance since then. Participating in SNF rehab as able     Hand Dominance        Extremity/Trunk Assessment   Upper Extremity Assessment Upper Extremity Assessment: Defer to OT evaluation    Lower Extremity Assessment Lower Extremity Assessment: Generalized weakness       Communication   Communication: No difficulties  Cognition Arousal/Alertness: Awake/alert Behavior During Therapy: Flat affect Overall Cognitive Status: Within Functional Limits for tasks assessed                                        General Comments      Exercises  Assessment/Plan    PT Assessment Patient needs continued PT services  PT Problem List Decreased strength;Decreased mobility;Decreased activity tolerance;Decreased balance;Pain;Decreased knowledge of use of DME       PT Treatment Interventions DME instruction;Gait training;Therapeutic exercise;Balance training;Functional mobility training;Patient/family education;Therapeutic activities    PT Goals (Current goals can be found in the Care Plan section)  Acute Rehab PT Goals Patient Stated Goal: get home PT Goal Formulation: With patient Time For Goal Achievement: 06/23/20 Potential to Achieve  Goals: Good    Frequency Min 2X/week   Barriers to discharge        Co-evaluation               AM-PAC PT "6 Clicks" Mobility  Outcome Measure Help needed turning from your back to your side while in a flat bed without using bedrails?: A Lot Help needed moving from lying on your back to sitting on the side of a flat bed without using bedrails?: A Lot Help needed moving to and from a bed to a chair (including a wheelchair)?: A Lot Help needed standing up from a chair using your arms (e.g., wheelchair or bedside chair)?: A Lot Help needed to walk in hospital room?: A Lot Help needed climbing 3-5 steps with a railing? : Total 6 Click Score: 11    End of Session Equipment Utilized During Treatment: Oxygen Activity Tolerance: Patient limited by fatigue;Patient limited by pain Patient left: in chair;with call bell/phone within reach;with chair alarm set   PT Visit Diagnosis: Pain;Muscle weakness (generalized) (M62.81);Difficulty in walking, not elsewhere classified (R26.2);History of falling (Z91.81)    Time: 7169-6789 PT Time Calculation (min) (ACUTE ONLY): 22 min   Charges:   PT Evaluation $PT Eval Moderate Complexity: 1 Mod             Faye Ramsay, PT Acute Rehabilitation  Office: [phone] Pager: [phone]

## 2020-06-09 NOTE — Progress Notes (Addendum)
PROGRESS NOTE    [name] [name]  WUJ:[id number] DOB: [birth year] DOA: 06/06/2020 PCP: Dana AllanWalsh, Tanya, MD (Confirm with patient/family/NH records and if not entered, [facility]. "No PCP" if truly none.)   Chief Complaint  Patient presents with  . Nausea    Brief Narrative:  Patient 70 year old female history of CHF(EF<20%), paroxysmal A. fib on chronic anticoagulation, hypertension, hyperlipidemia, IDDM type II, bipolar disorder recently hospitalized for acute embolic stroke requiring tPA and mechanical thrombectomy with hospital course complicated by cardiogenic shock/PEA cardiac arrest immediately following thrombectomy.  During that hospitalization patient required milrinone infusion and diuretics.  Patient also required vasopressors was extubated and weaned off pressors on 05/13/2020.  Patient status post TEE/DCCV on 5 9 and [facility].  Patient presented to the ED from SNF with right-sided abdominal pain, nausea vomiting, shortness of breath with speaking.  Patient noted to have a leukocytosis with a white count of 12.9, hemoglobin of 9.3, platelet count of 493.  Patient pancultured.  Chest x-ray showed complete opacification of the left hemithorax.  CT chest concerning for large complex multiloculated left pleural effusion increased in size since prior CT of 05/12/2020 with differential concerning for empyema versus sequelae of previous hemothorax versus neoplasm.  Patient placed empirically on IV vancomycin, cefepime, Flagyl.   IR consulted and pigtail catheter drain placed with 950 cc of bloody fluid aspirated.  PCCM consulted and patient with fibrinolysis 06/08/2020.  Cardiology also consulted for management of chronic complicated cardiac history.   Assessment & Plan:   Principal Problem:   Pleural effusion Active Problems:   HTN (hypertension)   Mixed hyperlipidemia   Uncontrolled type 2 diabetes mellitus with hyperglycemia (HCC)    A-fib (HCC)   Pleural effusion on left   Right upper quadrant abdominal pain   Chest pain   QT prolongation  1 large left multiloculated exudative pleural effusion -Differential including empyema versus sequelae of previous hemothorax versus neoplasm. -Patient presenting with abdominal pain/chest pain, shortness of breath with speaking. -Exudative pleural effusion by lights criteria. -Chest x-ray on admission with complete opacification of left hemithorax. -CT chest with large complex multilocular left pleural effusion increased in size since recent CT of the neck 05/12/2020. -Patient pancultured. -IR consulted and pigtail drain catheter placed with 950 cc of bloody aspirate noted. -PCCM/pulmonary consulted and patient subsequently underwent tPA/DNase on 5/29 (pleural fibrinolysis day 1).  -Patient for repeat tPA/DNase today per pulmonary.  -Continue to hold anticoagulation of Eliquis. -Continue empiric IV vancomycin, IV cefepime, IV Flagyl. -PCCM/pulmonary following and appreciate input and recommendations.  2.  Right-sided abdominal pain/nausea and vomiting -Likely secondary to rib fractures and also possible problem #1 versus secondary to recent resuscitation and possible constipation -CT abdomen and pelvis with no acute intra-abdominal process. -Abdominal ultrasound unremarkable. -COVID-19 PCR negative, influenza A and B PCR negative. -Lipase within normal limits. -Patient with complaints of significant abdominal pain. -Morphine discontinued.. -IV Toradol 15 mg every 6 hours as needed pain. -Oxycodone as needed. -Supportive care.  3.  Chest pain -Likely secondary to problem #1. -Patient seen in consultation by cardiology who feel patient's chest pain syndrome is noncardiac with flattening troponins. -Per cardiology.  4.  Recent embolic CVA -Status post tPA and mechanical thrombectomy during prior hospitalization. -Eliquis has been discontinued secondary to bloody aspirate  noted from pleural space with pigtail drain catheter.  5.  Paroxysmal atrial fibrillation/status post TEE/ DCCV 5/9 -In NSR.   -Continue amiodarone.   -  Eliquis on hold secondary to problem #1.   -Per cardiology.  6.  QT prolongation -Concerning as patient noted to be on amiodarone. -Repeat EKG with QT prolongation. -Cardiology consulted and feel QT prolongation is normal on amiodarone.  7.  Nonischemic cardiomyopathy/chronic systolic heart failure -Norvasc, digoxin, hydralazine, Imdur. -Not on spironolactone/ACE/ARB due to history of hyperkalemia. -Not on metoprolol due to allergy. -Not on Entresto or farxiga due to orthostasis -Per cardiology.  8.  Hypertension -Continue Norvasc.   -Hydralazine dose uptitrated per cardiology as well as Imdur dose for better blood pressure control. -Per cardiology.    9.  Hyperlipidemia -Continue statin.    10.  Poorly controlled insulin-dependent type 2 diabetes mellitus -Hemoglobin A1c 10.5 (05/13/2020) -CBG 156 this morning.   -Continue Lantus, SSI.  May need to uptitrate Lantus for better blood glucose control.  11.  Poor oral intake -Patient noted to mainly be tolerating clears on full liquids.  Patient hesitant for solid foods as she states is heavy on her stomach.  Consult with dietitian for nutritional assessment.  12.  Constipation -Continue MiraLAX twice daily -Placed on Senokot-S twice daily -Smog enema x1.   DVT prophylaxis: SCDs Code Status: Full Family Communication: Updated patient.  Updated daughter Olegario Shearer via telephone. Disposition:   Status is: Inpatient    Dispo: The patient is from: SNF([facility])              Anticipated d/c is to: SNF              Patient currently with multiloculated left pleural effusion status post pigtail catheter placement with bloody drainage, ongoing chest pain, not stable for discharge.   Difficult to place patient: Undetermined       Consultants:   Cardiology: Dr.  Graciela Husbands 06/07/2020  PCCM/pulmonary: Dr. Francine Graven 06/07/2020  IR: Dr. Grace Isaac 06/07/2020  Procedures:   CT chest 06/07/2020  CT abdomen and pelvis 06/06/2020  Chest x-ray 06/06/2020, 06/08/2020  Abdominal ultrasound 06/07/2020  CT-guided placement of 14 French all-purpose drain catheter into left pleural space with aspiration of 950 cc of bloody fluid--- Per Dr. Grace Isaac, IR 06/07/2020  Pleural fibrinolysis Per PCCM, Dr. Francine Graven 06/08/2020  Second pleural fibrinolysis pending 06/09/2020  Antimicrobials:   IV cefepime 06/07/2020>>>  IV Flagyl 06/07/2020>>>>  IV vancomycin 06/07/2020>>>>>   Subjective: Laying in bed.  Slight improvement with shortness of breath per patient.  Still with upper abdominal/right upper quadrant pain.  No nausea or emesis.  Poor oral intake per RN.  Patient noted to be mainly taken in clears on full liquids.  Patient hesitant to taking solid food as she states is heavy on her stomach.  Objective: Vitals:   06/08/20 2132 06/09/20 0500 06/09/20 0559 06/09/20 0954  BP: (!) 149/68  (!) 157/65 (!) 155/54  Pulse: 70  74 76  Resp: 20  20   Temp: 97.7 F (36.5 C)  97.8 F (36.6 C)   TempSrc: Oral  Oral   SpO2: 99%  100%   Weight:  81.9 kg      Intake/Output Summary (Last 24 hours) at 06/09/2020 1301 Last data filed at 06/09/2020 0603 Gross per 24 hour  Intake 1131.74 ml  Output 985 ml  Net 146.74 ml   Filed Weights   06/08/20 0500 06/09/20 0500  Weight: 81.5 kg 81.9 kg    Examination:  General exam: NAD Respiratory system: Shallow breath sounds.  No wheezing, no crackles.  Cardiovascular system: RRR no murmurs rubs or gallops.  No JVD.  No  lower extremity edema.  Pigtail catheter noted in left chest wall with some tenderness to palpation around insertion site.   Gastrointestinal system: Abdomen is soft, nondistended, tender to palpation epigastric and right upper quadrant.  Positive bowel sounds.  No rebound.  No guarding.  Central nervous system: Alert and  oriented.  Moving extremities spontaneously.  No focal neurological deficits.  Extremities: Symmetric 5 x 5 power. Skin: No rashes, lesions or ulcers Psychiatry: Judgement and insight appear normal. Mood & affect appropriate.     Data Reviewed: I have personally reviewed following labs and imaging studies  CBC: Recent Labs  Lab 06/06/20 2137 06/07/20 0823 06/08/20 0438 06/09/20 0528  WBC 12.9* 11.9* 10.8* 10.0  NEUTROABS 10.7* 10.0* 8.5* 7.6  HGB 9.3* 9.1* 9.4* 10.6*  HCT 30.5* 29.8* 31.3* 35.7*  MCV 82.2 82.1 83.2 84.2  PLT 493* 481* 556* 586*    Basic Metabolic Panel: Recent Labs  Lab 06/06/20 2137 06/07/20 0735 06/07/20 0823 06/08/20 0438 06/09/20 0528  NA 138  --  137 140 137  K 4.7  --  4.8 4.5 5.0  CL 98  --  100 104 102  CO2 37*  --  33* 32 30  GLUCOSE 197*  --  178* 78 170*  BUN 28*  --  27* 26* 27*  CREATININE 0.80  --  0.85 0.74 0.76  CALCIUM 8.9  --  8.4* 8.8* 8.6*  MG  --  2.1 1.9 2.1 1.9    GFR: Estimated Creatinine Clearance: 68.2 mL/min (by C-G formula based on SCr of 0.76 mg/dL).  Liver Function Tests: Recent Labs  Lab 06/06/20 2137 06/07/20 0823 06/08/20 0438  AST 66* 47* 39  ALT 34 32 30  ALKPHOS 168* 155* 156*  BILITOT 0.5 0.9 0.7  PROT 7.5 7.2 7.4  7.3  ALBUMIN 2.2* 2.1* 2.3*    CBG: Recent Labs  Lab 06/08/20 1622 06/08/20 2132 06/09/20 0422 06/09/20 0741 06/09/20 1209  GLUCAP 186* 155* 188* 156* 252*     Recent Results (from the past 240 hour(s))  Culture, blood (routine x 2)     Status: None (Preliminary result)   Collection Time: 06/07/20  2:10 AM   Specimen: BLOOD  Result Value Ref Range Status   Specimen Description   Final    BLOOD LEFT ANTECUBITAL Performed at [facility], [address], [place], [place] [address]    Special Requests   Final    BOTTLES DRAWN AEROBIC AND ANAEROBIC Blood Culture adequate volume Performed at [facility], [address],  [place], [place] [address]    Culture   Final    NO GROWTH 2 DAYS Performed at [facility], [address], [place], [place] [address]    Report Status PENDING  Incomplete  Culture, blood (Routine X 2) w Reflex to ID Panel     Status: None (Preliminary result)   Collection Time: 06/07/20  2:15 AM   Specimen: BLOOD  Result Value Ref Range Status   Specimen Description   Final    BLOOD BLOOD RIGHT HAND Performed at [facility], [address], [place], [place] [address]    Special Requests   Final    BOTTLES DRAWN AEROBIC ONLY Blood Culture adequate volume Performed at [facility], [address] [place], [place] [address]    Culture   Final    NO GROWTH 2 DAYS Performed at [facility], [address] [place], [place]  [address]    Report Status PENDING  Incomplete  Resp Panel by RT-PCR (Flu A&B, Covid) Nasopharyngeal Swab     Status: None   Collection Time: 06/07/20  2:26 AM   Specimen: Nasopharyngeal Swab; Nasopharyngeal(NP) swabs in vial transport medium  Result Value Ref Range Status   SARS Coronavirus 2 by RT PCR NEGATIVE NEGATIVE Final    Comment: (NOTE) SARS-CoV-2 target nucleic acids are NOT DETECTED.  The SARS-CoV-2 RNA is generally detectable in upper respiratory specimens during the acute phase of infection. The lowest concentration of SARS-CoV-2 viral copies this assay can detect is 138 copies/mL. A negative result does not preclude SARS-Cov-2 infection and should not be used as the sole basis for treatment or other patient management decisions. A negative result may occur with  improper specimen collection/handling, submission of specimen other than nasopharyngeal swab, presence of viral mutation(s) within the areas targeted by this assay, and inadequate number of viral copies(<138 copies/mL). A negative result must be combined with clinical observations, patient history, and epidemiological information. The  expected result is Negative.  Fact Sheet for Patients:  BloggerCourse.com  Fact Sheet for Healthcare Providers:  SeriousBroker.it  This test is no t yet approved or cleared by the [place] FDA and  has been authorized for detection and/or diagnosis of SARS-CoV-2 by FDA under an Emergency Use Authorization (EUA). This EUA will remain  in effect (meaning this test can be used) for the duration of the COVID-19 declaration under Section 564(b)(1) of the Act, 21 U.S.C.section 360bbb-3(b)(1), unless the authorization is terminated  or revoked sooner.       Influenza A by PCR NEGATIVE NEGATIVE Final   Influenza B by PCR NEGATIVE NEGATIVE Final    Comment: (NOTE) The Xpert Xpress SARS-CoV-2/FLU/RSV plus assay is intended as an aid in the diagnosis of influenza from Nasopharyngeal swab specimens and should not be used as a sole basis for treatment. Nasal washings and aspirates are unacceptable for Xpert Xpress SARS-CoV-2/FLU/RSV testing.  Fact Sheet for Patients: BloggerCourse.com  Fact Sheet for Healthcare Providers: SeriousBroker.it  This test is not yet approved or cleared by the [place] FDA and has been authorized for detection and/or diagnosis of SARS-CoV-2 by FDA under an Emergency Use Authorization (EUA). This EUA will remain in effect (meaning this test can be used) for the duration of the COVID-19 declaration under Section 564(b)(1) of the Act, 21 U.S.C. section 360bbb-3(b)(1), unless the authorization is terminated or revoked.  Performed at [facility], [address], [place], [place] [address]   Culture, Urine     Status: Abnormal   Collection Time: 06/07/20  6:38 AM   Specimen: Urine, Random  Result Value Ref Range Status   Specimen Description   Final    URINE, RANDOM Performed at [facility] [place], [address]  [place], [place] [address]    Special Requests   Final    NONE Performed at [facility], [address], [place], [place] [address]    Culture MULTIPLE SPECIES PRESENT, SUGGEST RECOLLECTION (A)  Final   Report Status 06/08/2020 FINAL  Final  Body fluid culture w Gram Stain     Status: None (Preliminary result)   Collection Time: 06/07/20 12:44 PM   Specimen: Pleura  Result Value Ref Range Status   Specimen Description   Final    PLEURAL LEFT Performed at [facility], [address], [place], [place] [address]    Special Requests   Final  NONE Performed at [facility], [address], [place], [place] [address]    Gram Stain   Final    RARE WBC PRESENT,BOTH PMN AND MONONUCLEAR NO ORGANISMS SEEN    Culture   Final    NO GROWTH 2 DAYS Performed at [facility], [address] [place], [place] [address]    Report Status PENDING  Incomplete  MRSA PCR Screening     Status: None   Collection Time: 06/09/20  8:12 AM   Specimen: Nasal Mucosa; Nasopharyngeal  Result Value Ref Range Status   MRSA by PCR NEGATIVE NEGATIVE Final    Comment:        The GeneXpert MRSA Assay (FDA approved for NASAL specimens only), is one component of a comprehensive MRSA colonization surveillance program. It is not intended to diagnose MRSA infection nor to guide or monitor treatment for MRSA infections. Performed at [facility], [address], [place], [place] [address]          Radiology Studies: US Abdomen Complete  Result Date: 06/07/2020 CLINICAL DATA:  Abdominal and back pain EXAM: ABDOMEN ULTRASOUND COMPLETE COMPARISON:  06/06/2020 FINDINGS: Gallbladder: Surgically absent. Common bile duct: Diameter: 5 mm Liver: No focal lesion identified. Within normal limits in parenchymal echogenicity. Portal vein is patent on color Doppler imaging with normal direction of blood flow towards the liver. IVC: No  abnormality visualized. Pancreas: Visualized portion unremarkable. Spleen: Normal size and appearance. Evaluation is limited due to overlying bandaging from left chest tube. The splenic hypodensity seen on CT is not visualized by ultrasound. Right Kidney: Length: 10.5 cm. Echogenicity within normal limits. No mass or hydronephrosis visualized. Left Kidney: Length: 10.9 cm. Echogenicity within normal limits. No mass or hydronephrosis visualized. Abdominal aorta: No aneurysm visualized. Other findings: None. IMPRESSION: 1. Grossly unremarkable abdominal ultrasound. 2. Limited evaluation of the spleen given overlying bandage material related to left-sided chest tube. The hypodensity seen on CT is not visualized by ultrasound. Electronically Signed   By: Sharlet Salina M.D.   On: 06/07/2020 21:55   DG CHEST PORT 1 VIEW  Result Date: 06/09/2020 CLINICAL DATA:  Follow-up left pleural effusion and chest tube. EXAM: PORTABLE CHEST 1 VIEW COMPARISON:  06/08/2020. FINDINGS: Left chest tube is stable. There is improvement in left lung aeration now with partial clearing of the left mid to upper lung. There is also evidence of a small left pneumothorax noted along upper lateral aspect of the lung, which may be an ex vacuo pneumothorax. Mild atelectasis at the medial right lung base. Right lung is otherwise clear. No right pneumothorax or effusion. IMPRESSION: 1. Mild interval improvement. Partial interval clearing of the left lung compared to the previous day's exam. 2. New small left pneumothorax likely an ex vacuo pneumothorax. 3. No other change. Electronically Signed   By: Amie Portland M.D.   On: 06/09/2020 08:06   DG CHEST PORT 1 VIEW  Result Date: 06/08/2020 CLINICAL DATA:  Pleural effusion EXAM: PORTABLE CHEST 1 VIEW COMPARISON:  CT chest dated 06/07/2020 FINDINGS: Near complete opacification of the left hemithorax, related to the patient's known complex pleural effusion/hemothorax. Mild lucency at the lung base  with indwelling pigtail drainage catheter. Right lung is clear. The heart is partially obscured. IMPRESSION: Near complete opacification of the left hemithorax, now with mild lucency at the left lung base, with indwelling pigtail drainage catheter. Electronically Signed   By: Charline Bills M.D.   On: 06/08/2020 05:46   CT [facility]  PLEURAL DRAIN W/INDWELL CATH W/IMG GUIDE  Result Date: 06/07/2020 INDICATION: Large left-sided pleural effusion, worrisome for hemothorax given history of recent CPR with subsequent initiation anticoagulation. Please perform image guided left-sided chest tube placement for therapeutic and diagnostic purposes. EXAM: CT PERC PLEURAL DRAIN W/INDWELL CATH W/IMG GUIDE COMPARISON:  Chest CT-06/07/2020 MEDICATIONS: The patient is currently admitted to the hospital and receiving intravenous antibiotics. The antibiotics were administered within an appropriate time frame prior to the initiation of the procedure. ANESTHESIA/SEDATION: Moderate (conscious) sedation was employed during this procedure. A total of Versed 2 mg and Fentanyl 100 mcg was administered intravenously. Moderate Sedation Time: 19 minutes. The patient's level of consciousness and vital signs were monitored continuously by radiology nursing throughout the procedure under my direct supervision. CONTRAST:  None COMPLICATIONS: None immediate. PROCEDURE: Informed written consent was obtained from the patient after a discussion of the risks, benefits and alternatives to treatment. The patient was placed supine, slightly RPO on the CT gantry and a pre procedural CT was performed re-demonstrating the known large likely partially loculated left-sided pleural effusion. The procedure was planned. A timeout was performed prior to the initiation of the procedure. The skin overlying the inferolateral aspect of left chest was prepped and draped in the usual sterile fashion. The overlying soft tissues were anesthetized with 1% lidocaine with  epinephrine. Appropriate trajectory was planned with the use of a 22 gauge spinal needle. An 18 gauge trocar needle was advanced into the abscess/fluid collection and a short Amplatz super stiff wire was coiled within the collection. Appropriate positioning was confirmed with a limited CT scan. The tract was serially dilated allowing placement of a 14 French all-purpose drainage catheter. Appropriate positioning was confirmed with a limited postprocedural CT scan. Approximately 950 cc of bloody fluid was aspirated. The tube was connected to a pleura vac device and sutured in place. A dressing was applied. The patient tolerated the procedure well without immediate post procedural complication. IMPRESSION: Successful CT guided placement of a 37 French all purpose drain catheter into the basilar aspect the left pleural space with aspiration of 950 cc of bloody fluid. Samples were sent to the laboratory as requested by the ordering clinical team. Electronically Signed   By: Simonne Come M.D.   On: 06/07/2020 15:16        Scheduled Meds: . amiodarone  200 mg Oral BID  . amLODipine  5 mg Oral Daily  . atorvastatin  80 mg Oral Daily  . ferrous gluconate  324 mg Oral Daily  . hydrALAZINE  50 mg Oral Q8H  . insulin aspart  0-5 Units Subcutaneous QHS  . insulin aspart  0-9 Units Subcutaneous TID WC  . insulin glargine  15 Units Subcutaneous Daily  . isosorbide mononitrate  60 mg Oral Daily  . magnesium oxide  400 mg Oral Daily  . polyethylene glycol  17 g Oral BID  . scopolamine  1 patch Transdermal Q72H  . senna-docusate  1 tablet Oral BID   Continuous Infusions: . ceFEPime (MAXIPIME) IV 2 g (06/09/20 0418)  . metronidazole 500 mg (06/09/20 0514)     LOS: 2 days    Time spent: 40 minutes    Ramiro Harvest, MD [facility]   To contact the attending provider between 7A-7P or the covering provider during after hours 7P-7A, please log into the web site [email or web address] and access using  universal [place] password for that web site. If you do not have the password, please call the hospital operator.  06/09/2020, 1:01 PM

## 2020-06-09 NOTE — Progress Notes (Signed)
Patient refused to have enema tonight. Patient stated that she is tried and does not want an enema after taking the Miralax and senokot. I educated patient and spoke with her about the medications, she has agreed to revisit the topic of the enema tomorrow morning.   Caprice Red, RN

## 2020-06-09 NOTE — Progress Notes (Signed)
Progress Note  Patient Name: [name] [name] Date of Encounter: 06/09/2020  Primary Cardiologist:   Thomasene Ripple, DO   Subjective   She has some pain where the chest tube is but otherwise denies any acute complaints.    Inpatient Medications    Scheduled Meds: . alteplase (TPA) for intrapleural administration  10 mg Intrapleural Once   And  . pulmozyme (DORNASE) for intrapleural administration  5 mg Intrapleural Once  . amiodarone  200 mg Oral BID  . amLODipine  5 mg Oral Daily  . atorvastatin  80 mg Oral Daily  . digoxin  0.125 mg Oral Daily  . ferrous gluconate  324 mg Oral Daily  . hydrALAZINE  50 mg Oral BID  . insulin aspart  0-5 Units Subcutaneous QHS  . insulin aspart  0-9 Units Subcutaneous TID WC  . insulin glargine  15 Units Subcutaneous Daily  . isosorbide mononitrate  15 mg Oral BID  . magnesium oxide  400 mg Oral Daily  . polyethylene glycol  17 g Oral BID  . scopolamine  1 patch Transdermal Q72H  . senna-docusate  1 tablet Oral BID   Continuous Infusions: . ceFEPime (MAXIPIME) IV 2 g (06/09/20 0418)  . metronidazole 500 mg (06/09/20 0514)   PRN Meds: hydrALAZINE, ketorolac, oxyCODONE   Vital Signs    Vitals:   06/08/20 1851 06/08/20 2132 06/09/20 0500 06/09/20 0559  BP: (!) 161/112 (!) 149/68  (!) 157/65  Pulse:  70  74  Resp:  20  20  Temp:  97.7 F (36.5 C)  97.8 F (36.6 C)  TempSrc:  Oral  Oral  SpO2:  99%  100%  Weight:   81.9 kg     Intake/Output Summary (Last 24 hours) at 06/09/2020 0802 Last data filed at 06/09/2020 0603 Gross per 24 hour  Intake 1531.74 ml  Output 1085 ml  Net 446.74 ml   Filed Weights   06/08/20 0500 06/09/20 0500  Weight: 81.5 kg 81.9 kg    Telemetry    NSR - Personally Reviewed  ECG    NA - Personally Reviewed  Physical Exam   GEN: No acute distress.   Neck: No  JVD Cardiac: RRR, no murmurs, rubs, or gallops.  Respiratory: Clear to auscultation bilaterally. GI: Soft, nontender, non-distended   MS: No edema; No deformity. Neuro:  Nonfocal  Psych: Normal affect   Labs    Chemistry Recent Labs  Lab 06/06/20 2137 06/07/20 0823 06/08/20 0438 06/09/20 0528  NA 138 137 140 137  K 4.7 4.8 4.5 5.0  CL 98 100 104 102  CO2 37* 33* 32 30  GLUCOSE 197* 178* 78 170*  BUN 28* 27* 26* 27*  CREATININE 0.80 0.85 0.74 0.76  CALCIUM 8.9 8.4* 8.8* 8.6*  PROT 7.5 7.2 7.4  7.3  --   ALBUMIN 2.2* 2.1* 2.3*  --   AST 66* 47* 39  --   ALT 34 32 30  --   ALKPHOS 168* 155* 156*  --   BILITOT 0.5 0.9 0.7  --   GFRNONAA >60 >60 >60 >60  ANIONGAP 3* 4* 4* 5     Hematology Recent Labs  Lab 06/07/20 0823 06/08/20 0438 06/09/20 0528  WBC 11.9* 10.8* 10.0  RBC 3.63* 3.76* 4.24  HGB 9.1* 9.4* 10.6*  HCT 29.8* 31.3* 35.7*  MCV 82.1 83.2 84.2  MCH 25.1* 25.0* 25.0*  MCHC 30.5 30.0 29.7*  RDW 18.6* 18.8* 18.7*  PLT 481* 556* 586*  Cardiac EnzymesNo results for input(s): TROPONINI in the last 168 hours. No results for input(s): TROPIPOC in the last 168 hours.   BNP Recent Labs  Lab 06/07/20 0735  BNP 707.6*     DDimer No results for input(s): DDIMER in the last 168 hours.   Radiology    US Abdomen Complete  Result Date: 06/07/2020 CLINICAL DATA:  Abdominal and back pain EXAM: ABDOMEN ULTRASOUND COMPLETE COMPARISON:  06/06/2020 FINDINGS: Gallbladder: Surgically absent. Common bile duct: Diameter: 5 mm Liver: No focal lesion identified. Within normal limits in parenchymal echogenicity. Portal vein is patent on color Doppler imaging with normal direction of blood flow towards the liver. IVC: No abnormality visualized. Pancreas: Visualized portion unremarkable. Spleen: Normal size and appearance. Evaluation is limited due to overlying bandaging from left chest tube. The splenic hypodensity seen on CT is not visualized by ultrasound. Right Kidney: Length: 10.5 cm. Echogenicity within normal limits. No mass or hydronephrosis visualized. Left Kidney: Length: 10.9 cm. Echogenicity  within normal limits. No mass or hydronephrosis visualized. Abdominal aorta: No aneurysm visualized. Other findings: None. IMPRESSION: 1. Grossly unremarkable abdominal ultrasound. 2. Limited evaluation of the spleen given overlying bandage material related to left-sided chest tube. The hypodensity seen on CT is not visualized by ultrasound. Electronically Signed   By: Sharlet Salina M.D.   On: 06/07/2020 21:55   DG CHEST PORT 1 VIEW  Result Date: 06/08/2020 CLINICAL DATA:  Pleural effusion EXAM: PORTABLE CHEST 1 VIEW COMPARISON:  CT chest dated 06/07/2020 FINDINGS: Near complete opacification of the left hemithorax, related to the patient's known complex pleural effusion/hemothorax. Mild lucency at the lung base with indwelling pigtail drainage catheter. Right lung is clear. The heart is partially obscured. IMPRESSION: Near complete opacification of the left hemithorax, now with mild lucency at the left lung base, with indwelling pigtail drainage catheter. Electronically Signed   By: Charline Bills M.D.   On: 06/08/2020 05:46   CT [facility] PLEURAL DRAIN W/INDWELL CATH W/IMG GUIDE  Result Date: 06/07/2020 INDICATION: Large left-sided pleural effusion, worrisome for hemothorax given history of recent CPR with subsequent initiation anticoagulation. Please perform image guided left-sided chest tube placement for therapeutic and diagnostic purposes. EXAM: CT PERC PLEURAL DRAIN W/INDWELL CATH W/IMG GUIDE COMPARISON:  Chest CT-06/07/2020 MEDICATIONS: The patient is currently admitted to the hospital and receiving intravenous antibiotics. The antibiotics were administered within an appropriate time frame prior to the initiation of the procedure. ANESTHESIA/SEDATION: Moderate (conscious) sedation was employed during this procedure. A total of Versed 2 mg and Fentanyl 100 mcg was administered intravenously. Moderate Sedation Time: 19 minutes. The patient's level of consciousness and vital signs were monitored  continuously by radiology nursing throughout the procedure under my direct supervision. CONTRAST:  None COMPLICATIONS: None immediate. PROCEDURE: Informed written consent was obtained from the patient after a discussion of the risks, benefits and alternatives to treatment. The patient was placed supine, slightly RPO on the CT gantry and a pre procedural CT was performed re-demonstrating the known large likely partially loculated left-sided pleural effusion. The procedure was planned. A timeout was performed prior to the initiation of the procedure. The skin overlying the inferolateral aspect of left chest was prepped and draped in the usual sterile fashion. The overlying soft tissues were anesthetized with 1% lidocaine with epinephrine. Appropriate trajectory was planned with the use of a 22 gauge spinal needle. An 18 gauge trocar needle was advanced into the abscess/fluid collection and a short Amplatz super stiff wire was coiled within the collection. Appropriate positioning  was confirmed with a limited CT scan. The tract was serially dilated allowing placement of a 14 French all-purpose drainage catheter. Appropriate positioning was confirmed with a limited postprocedural CT scan. Approximately 950 cc of bloody fluid was aspirated. The tube was connected to a pleura vac device and sutured in place. A dressing was applied. The patient tolerated the procedure well without immediate post procedural complication. IMPRESSION: Successful CT guided placement of a 73 French all purpose drain catheter into the basilar aspect the left pleural space with aspiration of 950 cc of bloody fluid. Samples were sent to the laboratory as requested by the ordering clinical team. Electronically Signed   By: Simonne Come M.D.   On: 06/07/2020 15:16    Cardiac Studies   ECHO:  05/12/20  1. Left ventricular ejection fraction, by estimation, is <20%. The left  ventricle has severely decreased function. The left ventricle  demonstrates  global hypokinesis. There is severe concentric left ventricular  hypertrophy. Diastolic function is  indeterminant due to atrial fibrillation. No LV thrombus visualized,  however, apex incompletely visualized due to lack of definity contrast.  2. Right ventricular systolic function is severely reduced. The right  ventricular size is mildly enlarged. There is mildly elevated pulmonary  artery systolic pressure. The estimated right ventricular systolic  pressure is 40.6 mmHg.  3. A large, circumferential pericardial effusion is present. The effusion  appears to be greatest around the posterolateral LV measuring up to 2.2cm  at end-diastole. There is no RV or RA collapse to suggest tamponade. IVC  is dilated and not collapsible  but patient is on the vent with significant TR, which are likely the  primary drivers of IVC dilation.  4. The mitral valve is normal in structure. Mild mitral valve  regurgitation.  5. Tricuspid valve regurgitation is moderate to severe.  6. The aortic valve is tricuspid. There is mild calcification of the  aortic valve. There is mild thickening of the aortic valve. Aortic valve  regurgitation is trivial. Mild aortic valve sclerosis is present, with no  evidence of aortic valve stenosis.  7. Left atrial size was mildly dilated.   Comparison(s): Compared to prior TTE in 01/2020, the LVEF is now severely  reduced (<20%) and a large pericardial effusion is present (previously  small). The RV systolic function now appears severely reduced.     Patient Profile     70 y.o. female with schizophrenia/bipolar who wasadmitted with worsening SOB, RUQ and non cardiac chest pain found to have large L complicated pleural effusion.  She has a history of presumed rate related NICM in the setting of recurrent atrial arrhythmias   She was recently hospitalized with acute stroke and treated with tPA/thrombectomy complicated by PEA arrest, shock and worsening  CM.  She is on  amio s/p DCCV .  Thoracentesis w Chest tube 5/28>> 900 cc bloody fluid.   Apixoban now on hold   Assessment & Plan    COMPLEX LEFT SIDED EFFUSION:  Exudative bloody effusion.  Question secondary to trauma/resuscitation.  Resume anticoagulation post DCCV when felt to be stable from a medical standpoint.     ATRIAL FIB:  NSR.  Continue amiodarone 200 bid x 2 weeks.   Digoxin was 1.4.  I stopped the digoxin.    HYPERTENSION:    This is being managed in the context of treating his CHF.     CARDIOMYOPATHY:      EF is less than 20%.    I will titrate the  Imdur and hydralazine. Ultimately she would likely tolerate Entresto.  It was being held secondary to hyperkalemia.    PERICARDIAL EFFUSION:  I reviewed the images.  Appears on TTE to be mostly posterior though there is some effusion anteriorly.  She certainly does not have tamponade physiology with her elevated BPs.    I don't see the reading of the TEE completed although I again looked at the images.  My suggestion will be to follow up the echo possibly soon after discharge to look for resolution.    For questions or updates, please contact [facility] Please consult [email or web address] for contact info under Cardiology/STEMI.   Signed, Rollene Rotunda, MD  06/09/2020, 8:02 AM

## 2020-06-09 NOTE — Progress Notes (Signed)
NAME:  [name] [name], MRN:  [id number], DOB:  1950-07-02, LOS: 2 ADMISSION DATE:  06/06/2020, CONSULTATION DATE:  06/07/20 REFERRING MD:  Ramiro Harvest, MD CHIEF COMPLAINT:  Pleural effusion  History of Present Illness:  [name] [name] is a 70 year old woman with CHF, paroxysmal atrial fibrillation, and DMII who was recently admitted 5/2 to 5/16 for acute embolic stroke due to PAG s/p TPA therapy and mechanical thrombectomy. Her course was complicated by cardiac arrest and cardiogenic shock. She required milrinone drip and cardioversion for the atrial fibrillation. She has been eliquis for anticoagulation after the stroke. She was sent to [facility] on 5/16 where she presented from with right sided abdominal pain, nausea and vomiting. She also complained of some shortness of breath and has been on 3L of O2 since being at [facility].   Work up in the ED revealed left hemithorax opacification based on chest radiograph and CT chest showed loculated pleural effusion involving the left hemithorax.   IR was consulted for chest tube placement and a 25fr pigtail catheter was placed via CT guidance with of bloody appearing fluid removed initially.   PCCM was consulted for further evaluation and treatment of the left pleural effusion.   Pleural fluid studies show LDH 1636, total protein 3.1, glucose 134, albumin 1.5. Cell count is pending at this time.   Pertinent  Medical History  Atrial Fibrillation Heart Failure DMII  Significant Hospital Events: Including procedures, antibiotic start and stop dates in addition to other pertinent events   . 5/28 admitted to [facility], 35fr chest tube placed by IR  Interim History / Subjective:   Patient reports feeling a bit better and her breathing has improved. She is having left sided pain that is about an 8/10.   Chest tube output is 1.5L from the nursing notes.   Chest radiograph shows improvemed aeration of the left upper lobe but a persistent  loculation around the left mid lung field.   Objective   Blood pressure (!) 157/65, pulse 74, temperature 97.8 F (36.6 C), temperature source Oral, resp. rate 20, weight 81.9 kg, SpO2 100 %.        Intake/Output Summary (Last 24 hours) at 06/09/2020 7408 Last data filed at 06/09/2020 0603 Gross per 24 hour  Intake 1531.74 ml  Output 1085 ml  Net 446.74 ml   Filed Weights   06/08/20 0500 06/09/20 0500  Weight: 81.5 kg 81.9 kg    Examination: General: Elderly woman, no acute distress HENT: Ama/AT, moist mucous membranes, sclera anicteric Lungs: Crackls throughout on left.  Clear to auscultation on the right.  Left pigtail catheter in place with bloody drainage. Cardiovascular: Regular rate and rhythm, no murmurs Abdomen: Soft, non-tender, bowel sounds present Extremities: Warm, no edema Neuro: Alert, oriented x3, moving all extremities. GU: Deferred  Labs/imaging that I have personally reviewed  (right click and "Reselect all SmartList Selections" daily)  5/27 CT Abdomen 5/28 CT Chest  CXR 5/30: improved aeration of left mid/upper lung fields. Small left pneumothorax  BNP 707, troponin 35, LDH 496, total protein 7.2, creatinine 0.85 CBC: WBC 11.9, hemoglobin 9.1 (Hgb on 05/13/20 was 12)  Pleural fluid: albumin 1.5, LDH 1636, total protein 3.1, cell count 67% lymph  Pleural culture - no growth to day Blood cultures - no growth to day  Resolved Hospital Problem list     Assessment & Plan:   Left complicated pleural effusion Based on review of chest imaging the pleural effusion is new since 01/2020  and first noted on chest radiograph 05/13/20.  Pleural fluid studies indicate exudative process based on LDH ratio. The etiology of the pleural effusion is most concerning for hemothorax s/p resuscitation  - f/u cytology and cultures. - TPA/DNAse therapy initiated 5/29 with improvement in chest radiograph. Will plan for a second dose today.  - Hold therapeutic anticoagulation for  now while on tpa/dnase therapy. - Continue chest tube to -20 cmH2O - de-escalate antibiotics to ceftriaxone alone to complete a 7 day course.  PCCM will continue to follow.   Labs   CBC: Recent Labs  Lab 06/06/20 2137 06/07/20 0823 06/08/20 0438 06/09/20 0528  WBC 12.9* 11.9* 10.8* 10.0  NEUTROABS 10.7* 10.0* 8.5* 7.6  HGB 9.3* 9.1* 9.4* 10.6*  HCT 30.5* 29.8* 31.3* 35.7*  MCV 82.2 82.1 83.2 84.2  PLT 493* 481* 556* 586*    Basic Metabolic Panel: Recent Labs  Lab 06/06/20 2137 06/07/20 0735 06/07/20 0823 06/08/20 0438 06/09/20 0528  NA 138  --  137 140 137  K 4.7  --  4.8 4.5 5.0  CL 98  --  100 104 102  CO2 37*  --  33* 32 30  GLUCOSE 197*  --  178* 78 170*  BUN 28*  --  27* 26* 27*  CREATININE 0.80  --  0.85 0.74 0.76  CALCIUM 8.9  --  8.4* 8.8* 8.6*  MG  --  2.1 1.9 2.1 1.9   GFR: Estimated Creatinine Clearance: 68.2 mL/min (by C-G formula based on SCr of 0.76 mg/dL). Recent Labs  Lab 06/06/20 2137 06/07/20 0823 06/08/20 0438 06/09/20 0528  WBC 12.9* 11.9* 10.8* 10.0    Liver Function Tests: Recent Labs  Lab 06/06/20 2137 06/07/20 0823 06/08/20 0438  AST 66* 47* 39  ALT 34 32 30  ALKPHOS 168* 155* 156*  BILITOT 0.5 0.9 0.7  PROT 7.5 7.2 7.4  7.3  ALBUMIN 2.2* 2.1* 2.3*   Recent Labs  Lab 06/06/20 2137  LIPASE 35   No results for input(s): AMMONIA in the last 168 hours.  ABG    Component Value Date/Time   PHART 7.401 05/12/2020 1602   PCO2ART 37.6 05/12/2020 1602   PO2ART 349 (H) 05/12/2020 1602   HCO3 23.5 05/12/2020 1602   TCO2 25 05/12/2020 1602   ACIDBASEDEF 1.0 05/12/2020 1602   O2SAT 73.7 05/20/2020 0520     Coagulation Profile: Recent Labs  Lab 06/07/20 1533  INR 1.8*    Cardiac Enzymes: No results for input(s): CKTOTAL, CKMB, CKMBINDEX, TROPONINI in the last 168 hours.  HbA1C: HbA1c, POC (controlled diabetic range)  Date/Time Value Ref Range Status  12/30/2017 10:02 AM 9.4 (A) 0.0 - 7.0 % Final  09/06/2017  01:31 PM 8.8 (A) 0.0 - 7.0 % Final   HbA1c POC (<> result, manual entry)  Date/Time Value Ref Range Status  07/26/2019 11:08 AM >15.0 4.0 - 5.6 % Final   Hgb A1c MFr Bld  Date/Time Value Ref Range Status  05/13/2020 05:27 AM 10.5 (H) 4.8 - 5.6 % Final    Comment:    (NOTE) Pre diabetes:          5.7%-6.4%  Diabetes:              >6.4%  Glycemic control for   <7.0% adults with diabetes   02/04/2020 06:57 AM 10.4 (H) 4.8 - 5.6 % Final    Comment:    (NOTE) Pre diabetes:          5.7%-6.4%  Diabetes:              >  6.4%  Glycemic control for   <7.0% adults with diabetes     CBG: Recent Labs  Lab 06/08/20 1132 06/08/20 1622 06/08/20 2132 06/09/20 0422 06/09/20 0741  GLUCAP 177* 186* 155* 188* 156*    Critical care time: n/a    Melody Comas, MD [facility] Office: [phone]   See Amion for personal pager PCCM on call pager [phone] until 7pm. Please call Elink 7p-7a. [phone]

## 2020-06-09 NOTE — Progress Notes (Signed)
Patient chest tube 560 ml dark red color output from 1900-0530, uneventful shift, vital signs stable, will continue to monitor

## 2020-06-10 ENCOUNTER — Inpatient Hospital Stay (HOSPITAL_COMMUNITY): Payer: Medicare Other

## 2020-06-10 DIAGNOSIS — J90 Pleural effusion, not elsewhere classified: Secondary | ICD-10-CM | POA: Diagnosis not present

## 2020-06-10 LAB — CBC WITH DIFFERENTIAL/PLATELET
Abs Immature Granulocytes: 0.06 10*3/uL (ref 0.00–0.07)
Basophils Absolute: 0 10*3/uL (ref 0.0–0.1)
Basophils Relative: 0 %
Eosinophils Absolute: 0.1 10*3/uL (ref 0.0–0.5)
Eosinophils Relative: 1 %
HCT: 32.8 % — ABNORMAL LOW (ref 36.0–46.0)
Hemoglobin: 9.8 g/dL — ABNORMAL LOW (ref 12.0–15.0)
Immature Granulocytes: 1 %
Lymphocytes Relative: 10 %
Lymphs Abs: 1.1 10*3/uL (ref 0.7–4.0)
MCH: 24.7 pg — ABNORMAL LOW (ref 26.0–34.0)
MCHC: 29.9 g/dL — ABNORMAL LOW (ref 30.0–36.0)
MCV: 82.6 fL (ref 80.0–100.0)
Monocytes Absolute: 1.5 10*3/uL — ABNORMAL HIGH (ref 0.1–1.0)
Monocytes Relative: 14 %
Neutro Abs: 7.5 10*3/uL (ref 1.7–7.7)
Neutrophils Relative %: 74 %
Platelets: 557 10*3/uL — ABNORMAL HIGH (ref 150–400)
RBC: 3.97 MIL/uL (ref 3.87–5.11)
RDW: 18.3 % — ABNORMAL HIGH (ref 11.5–15.5)
WBC: 10.3 10*3/uL (ref 4.0–10.5)
nRBC: 0 % (ref 0.0–0.2)

## 2020-06-10 LAB — BODY FLUID CULTURE W GRAM STAIN: Culture: NO GROWTH

## 2020-06-10 LAB — GLUCOSE, CAPILLARY
Glucose-Capillary: 158 mg/dL — ABNORMAL HIGH (ref 70–99)
Glucose-Capillary: 199 mg/dL — ABNORMAL HIGH (ref 70–99)
Glucose-Capillary: 224 mg/dL — ABNORMAL HIGH (ref 70–99)
Glucose-Capillary: 231 mg/dL — ABNORMAL HIGH (ref 70–99)

## 2020-06-10 LAB — TRIGLYCERIDES, BODY FLUIDS: Triglycerides, Fluid: 63 mg/dL

## 2020-06-10 LAB — BASIC METABOLIC PANEL
Anion gap: 3 — ABNORMAL LOW (ref 5–15)
BUN: 26 mg/dL — ABNORMAL HIGH (ref 8–23)
CO2: 31 mmol/L (ref 22–32)
Calcium: 8.4 mg/dL — ABNORMAL LOW (ref 8.9–10.3)
Chloride: 103 mmol/L (ref 98–111)
Creatinine, Ser: 0.78 mg/dL (ref 0.44–1.00)
GFR, Estimated: >60 mL/min (ref >60)
Glucose, Bld: 134 mg/dL — ABNORMAL HIGH (ref 70–99)
Potassium: 5.1 mmol/L (ref 3.5–5.1)
Sodium: 137 mmol/L (ref 135–145)

## 2020-06-10 LAB — MAGNESIUM: Magnesium: 1.9 mg/dL (ref 1.7–2.4)

## 2020-06-10 MED ORDER — SODIUM CHLORIDE (PF) 0.9 % IJ SOLN
10.0000 mg | Freq: Once | INTRAMUSCULAR | Status: AC
  Administered 2020-06-10: 10 mg via INTRAPLEURAL
  Filled 2020-06-10: qty 10

## 2020-06-10 MED ORDER — METRONIDAZOLE 500 MG PO TABS
500.0000 mg | ORAL_TABLET | Freq: Three times a day (TID) | ORAL | Status: DC
  Administered 2020-06-10 – 2020-06-12 (×7): 500 mg via ORAL
  Filled 2020-06-10 (×7): qty 1

## 2020-06-10 MED ORDER — SODIUM CHLORIDE 0.9 % IV SOLN
INTRAVENOUS | Status: DC | PRN
  Administered 2020-06-10: 250 mL via INTRAVENOUS

## 2020-06-10 MED ORDER — STERILE WATER FOR INJECTION IJ SOLN
5.0000 mg | Freq: Once | RESPIRATORY_TRACT | Status: AC
  Administered 2020-06-10: 5 mg via INTRAPLEURAL
  Filled 2020-06-10: qty 5

## 2020-06-10 MED ORDER — ISOSORB DINITRATE-HYDRALAZINE 20-37.5 MG PO TABS
2.0000 | ORAL_TABLET | Freq: Three times a day (TID) | ORAL | Status: DC
  Administered 2020-06-10 – 2020-06-21 (×34): 2 via ORAL
  Filled 2020-06-10 (×36): qty 2

## 2020-06-10 NOTE — Evaluation (Signed)
Occupational Therapy Evaluation Patient Details Name: [name] [name] MRN: [id number] DOB: 19-Jun-1950 Today's Date: 06/10/2020    History of Present Illness 70 yo female admited with "large/complicated" pleural effusion, L hemithorax s/p chest tube placement. Recent last admit-(CVA, post procedure cardiac arrest, cardiogenic shock, DCCV). Hx of DM, Afib, COVID, schizophrenia, bipolar d/o, CHF   Clinical Impression   PTA patient was living in an ILF apartment and was grossly I with ADLs/IADLs without AD. Patient currently functioning below baseline demonstrating observed ADLs and ADL transfers with Min A. Patient currently requiring Max A for LB bathing/dressing. Patient also limited by deficits listed below including generalized weakness and decreased activity tolerance and would benefit from continued acute OT services in prep for safe d/c to next level of care with recommendation for return to SNF rehab.     Follow Up Recommendations  SNF    Equipment Recommendations  None recommended by OT    Recommendations for Other Services       Precautions / Restrictions Precautions Precautions: Fall Precaution Comments: chest tube; back pain; orthostatics Restrictions Weight Bearing Restrictions: No      Mobility Bed Mobility Overal bed mobility: Needs Assistance Bed Mobility: Supine to Sit     Supine to sit: Min guard     General bed mobility comments: Patient able to progress from supine to long sitting with use of grab bar for MD exam. Able to advance BLE from bed surface to EOB without external assist and complete anterior scoot toward EOB with Min A and HHA +1.    Transfers Overall transfer level: Needs assistance Equipment used: Rolling walker (2 wheeled) Transfers: Sit to/from Stand Sit to Stand: Min assist Stand pivot transfers: Min guard       General transfer comment: Min A for sit to stand froM EOB and Min guard for stand-pivot to recliner. Additional assist  for line management.    Balance Overall balance assessment: Needs assistance Sitting-balance support: Feet supported Sitting balance-Leahy Scale: Fair     Standing balance support: Bilateral upper extremity supported Standing balance-Leahy Scale: Poor Standing balance comment: Reliant on BUE on RW.                           ADL either performed or assessed with clinical judgement   ADL Overall ADL's : Needs assistance/impaired                     Lower Body Dressing: Maximal assistance;Sit to/from stand Lower Body Dressing Details (indicate cue type and reason): Max A to don footwear seated EOB. Toilet Transfer: Minimal Production designer, theatre/television/film Details (indicate cue type and reason): Simulated with transfer to recliner with use of RW. Requires increased time.         Functional mobility during ADLs: Rolling walker;Min guard       Vision Baseline Vision/History: Wears glasses Wears Glasses: At all times Patient Visual Report: No change from baseline       Perception     Praxis      Pertinent Vitals/Pain Pain Assessment: Faces Faces Pain Scale: Hurts little more Pain Location: back and R side Pain Descriptors / Indicators: Aching;Sore;Grimacing Pain Intervention(s): Limited activity within patient's tolerance;Monitored during session;Premedicated before session;Repositioned     Hand Dominance Right   Extremity/Trunk Assessment Upper Extremity Assessment Upper Extremity Assessment: Generalized weakness RUE Deficits / Details: Symmetrical bilaterally   Lower Extremity Assessment Lower Extremity Assessment: Defer to PT evaluation  Communication Communication Communication: No difficulties   Cognition Arousal/Alertness: Awake/alert Behavior During Therapy: WFL for tasks assessed/performed Overall Cognitive Status: Within Functional Limits for tasks assessed                                     General Comments   VSS on 2L O2. Reports mild dizziness upon sitting at EOB. Extra time given betweeen transitions to allow for adjustments in BP.    Exercises     Shoulder Instructions      Home Living Family/patient expects to be discharged to:: Other (Comment) (Independent Living Facility) Living Arrangements: Alone Available Help at Discharge: Family;Available PRN/intermittently Type of Home: Apartment Home Access: Level entry     Home Layout: One level     Bathroom Shower/Tub: Chief Strategy Officer: Handicapped height Bathroom Accessibility: Yes   Home Equipment: Cane - single point   Additional Comments: just qualified for CNA      Prior Functioning/Environment Level of Independence: Independent        Comments: Independent prior to recent hospital amission 5/2-5/16. Requiring assistance since then. Participating in SNF rehab as able.        OT Problem List: Decreased strength;Decreased activity tolerance;Impaired balance (sitting and/or standing);Decreased knowledge of use of DME or AE;Decreased cognition;Decreased safety awareness;Pain      OT Treatment/Interventions: Self-care/ADL training;DME and/or AE instruction;Therapeutic activities;Cognitive remediation/compensation;Patient/family education;Balance training    OT Goals(Current goals can be found in the care plan section) Acute Rehab OT Goals Patient Stated Goal: get home OT Goal Formulation: With patient Time For Goal Achievement: 06/24/20 Potential to Achieve Goals: Good ADL Goals Pt Will Perform Grooming: with supervision;standing Pt Will Perform Upper Body Dressing: with set-up;sitting Pt Will Perform Lower Body Dressing: with supervision;sit to/from stand Pt Will Transfer to Toilet: with supervision;ambulating Pt Will Perform Toileting - Clothing Manipulation and hygiene: with supervision;sit to/from stand  OT Frequency: Min 2X/week   Barriers to D/C: Decreased caregiver support  Lives alone        Co-evaluation              AM-PAC OT "6 Clicks" Daily Activity     Outcome Measure Help from another person eating meals?: None Help from another person taking care of personal grooming?: None Help from another person toileting, which includes using toliet, bedpan, or urinal?: A Little Help from another person bathing (including washing, rinsing, drying)?: A Little Help from another person to put on and taking off regular upper body clothing?: A Little Help from another person to put on and taking off regular lower body clothing?: A Lot 6 Click Score: 19   End of Session Equipment Utilized During Treatment: Gait belt;Rolling walker;Oxygen Nurse Communication: Mobility status  Activity Tolerance: Patient tolerated treatment well Patient left: in chair;with call bell/phone within reach;with chair alarm set  OT Visit Diagnosis: Unsteadiness on feet (R26.81);Other abnormalities of gait and mobility (R26.89);Pain;Muscle weakness (generalized) (M62.81);Other symptoms and signs involving cognitive function Pain - Right/Left: Right Pain - part of body:  (Flank and low back)                Time: 1030-1051 OT Time Calculation (min): 21 min Charges:  OT General Charges $OT Visit: 1 Visit OT Evaluation $OT Eval Moderate Complexity: 1 Mod  Kaedynce Tapp H. OTR/L Supplemental OT, Department of rehab services [phone]  Jancarlos Thrun R H. 06/10/2020, 11:04 AM

## 2020-06-10 NOTE — Progress Notes (Addendum)
PROGRESS NOTE    [name] [name]  UKG:[id number] DOB: Jul 12, 1950 DOA: 06/06/2020 PCP: Dana Allan, MD -- [facility]   Chief Complaint  Patient presents with  . Nausea    Brief Narrative:  Patient 70 year old female history of CHF(EF<20%), paroxysmal A. fib on chronic anticoagulation, hypertension, hyperlipidemia, IDDM type II, bipolar disorder recently hospitalized for acute embolic stroke requiring tPA and mechanical thrombectomy with hospital course complicated by cardiogenic shock/PEA cardiac arrest immediately following thrombectomy.  During that hospitalization patient required milrinone infusion and diuretics.  Patient also required vasopressors was extubated and weaned off pressors on 05/13/2020.  Patient status post TEE/DCCV on 5/9 and discharged to [facility] 5/16.  Patient presented to the ED from SNF with right-sided abdominal pain, nausea vomiting, shortness of breath with speaking.  Patient noted to have a leukocytosis with a white count of 12.9, hemoglobin of 9.3, platelet count of 493.  Patient pancultured.  Chest x-ray showed complete opacification of the left hemithorax.  CT chest concerning for large complex multiloculated left pleural effusion increased in size since prior CT of 05/12/2020 with differential concerning for empyema versus sequelae of previous hemothorax versus neoplasm.  IR consulted and pigtail catheter drain placed with 950 cc of bloody fluid aspirated.  PCCM consulted and patient with fibrinolysis 06/08/2020.  Cardiology also consulted for management of chronic complicated cardiac history.   Assessment & Plan:   Principal Problem:   Pleural effusion Active Problems:   HTN (hypertension)   Mixed hyperlipidemia   Uncontrolled type 2 diabetes mellitus with hyperglycemia (HCC)   A-fib (HCC)   Pleural effusion on left   Right upper quadrant abdominal pain   Chest pain   QT prolongation   large left multiloculated exudative pleural  effusion -Patient presenting with abdominal pain/chest pain, shortness of breath with speaking. -Exudative pleural effusion by lights criteria. -CT chest with large complex multilocular left pleural effusion increased in size since recent CT of the neck 05/12/2020. -IR consulted and pigtail drain catheter placed with 950 cc of bloody aspirate noted. -PCCM/pulmonary consulted and patient subsequently underwent tPA/DNase on 5/29 (pleural fibrinolysis day 1).  -Patient for repeat tPA/DNase today per pulmonary.  -Continue to hold anticoagulation of Eliquis. -Continue empiric IV vancomycin, IV cefepime, IV Flagyl. -PCCM/pulmonary following and appreciate input and recommendations.  Right-sided abdominal pain/nausea and vomiting -Likely secondary to rib fractures and also possible problem #1 versus secondary to recent resuscitation and possible constipation -CT abdomen and pelvis with no acute intra-abdominal process. -Abdominal ultrasound unremarkable. -COVID-19 PCR negative, influenza A and B PCR negative. -Lipase within normal limits. -IV Toradol 15 mg every 6 hours as needed pain. -Oxycodone as needed. -Supportive care.  Chest pain -Likely secondary to problem #1. -Patient seen in consultation by cardiology who feel patient's chest pain syndrome is noncardiac with flattening troponins.  Recent embolic CVA -Status post tPA and mechanical thrombectomy during prior hospitalization. -Eliquis has been discontinued secondary to bloody aspirate noted from pleural space with pigtail drain catheter. -resume eliquis when ok with PCCM (per note: Hold therapeutic anticoagulation for now while on tpa/dnase therapy)  Paroxysmal atrial fibrillation/status post TEE/ DCCV 5/9 -In NSR.   -Continue amiodarone.   -Eliquis on hold secondary to problem #1.    QT prolongation -Concerning as patient noted to be on amiodarone. -Cardiology consulted: QT prolongation is normal on amiodarone.  Nonischemic  cardiomyopathy/chronic systolic heart failure -Norvasc, digoxin, hydralazine, Imdur. -Not on spironolactone/ACE/ARB due to history of hyperkalemia. -Not on metoprolol due to allergy. -Not on Entresto  or farxiga due to orthostasis -Per cardiology.  Hypertension -Continue Norvasc.   -Hydralazine dose uptitrated per cardiology as well as Imdur dose for better blood pressure control. -Per cardiology.    Hyperlipidemia -Continue statin.    Poorly controlled insulin-dependent type 2 diabetes mellitus -Hemoglobin A1c 10.5 (05/13/2020) -Continue Lantus, SSI.  May need to uptitrate Lantus for better blood glucose control.   Constipation -Continue MiraLAX twice daily -Placed on Senokot-S twice daily -Smog enema x1.   DVT prophylaxis: SCDs Code Status: Full Family Communication: Updated patient.  Updated daughter Olegario Shearer via telephone. Disposition:   Status is: Inpatient    Dispo: The patient is from: SNF([facility])              Anticipated d/c is to: SNF              Patient currently with multiloculated left pleural effusion status post pigtail catheter placement with bloody drainage, ongoing chest pain, not stable for discharge.   Difficult to place patient: Undetermined       Consultants:   Cardiology: Dr. Graciela Husbands 06/07/2020  PCCM/pulmonary: Dr. Francine Graven 06/07/2020  IR: Dr. Grace Isaac 06/07/2020  Procedures:   CT chest 06/07/2020  CT abdomen and pelvis 06/06/2020  Chest x-ray 06/06/2020, 06/08/2020  Abdominal ultrasound 06/07/2020  CT-guided placement of 14 French all-purpose drain catheter into left pleural space with aspiration of 950 cc of bloody fluid--- Per Dr. Grace Isaac, IR 06/07/2020  Pleural fibrinolysis Per PCCM, Dr. Francine Graven 06/08/2020  Second pleural fibrinolysis pending 06/09/2020  Antimicrobials:   IV cefepime 06/07/2020>>>  IV Flagyl 06/07/2020>>>>  IV vancomycin 06/07/2020>>>>>   Subjective: No currently complaints-- does get waves of pain every now  and then but controlled with pain meds  Objective: Vitals:   06/09/20 1324 06/09/20 2026 06/10/20 0500 06/10/20 0548  BP: (!) 129/50 139/78  (!) 134/55  Pulse: 73 67  61  Resp: (!) 22 18  20   Temp: 97.7 F (36.5 C) 97.8 F (36.6 C)  97.8 F (36.6 C)  TempSrc: Oral Oral  Oral  SpO2: 97% 100%  100%  Weight:   81.1 kg     Intake/Output Summary (Last 24 hours) at 06/10/2020 0918 Last data filed at 06/10/2020 0542 Gross per 24 hour  Intake 1840.53 ml  Output 1140 ml  Net 700.53 ml   Filed Weights   06/08/20 0500 06/09/20 0500 06/10/20 0500  Weight: 81.5 kg 81.9 kg 81.1 kg    Examination:  General: Appearance:    Obese female in no acute distress     Lungs:     Chest tube in place, respirations unlabored  Heart:    Normal heart rate.   MS:   All extremities are intact.   Neurologic:   Awake, alert, oriented x 3. No apparent focal neurological           defect.      Data Reviewed: I have personally reviewed following labs and imaging studies  CBC: Recent Labs  Lab 06/06/20 2137 06/07/20 0823 06/08/20 0438 06/09/20 0528 06/10/20 0431  WBC 12.9* 11.9* 10.8* 10.0 10.3  NEUTROABS 10.7* 10.0* 8.5* 7.6 7.5  HGB 9.3* 9.1* 9.4* 10.6* 9.8*  HCT 30.5* 29.8* 31.3* 35.7* 32.8*  MCV 82.2 82.1 83.2 84.2 82.6  PLT 493* 481* 556* 586* 557*    Basic Metabolic Panel: Recent Labs  Lab 06/06/20 2137 06/07/20 0735 06/07/20 0823 06/08/20 0438 06/09/20 0528 06/10/20 0431  NA 138  --  137 140 137 137  K 4.7  --  4.8 4.5 5.0 5.1  CL 98  --  100 104 102 103  CO2 37*  --  33* 32 30 31  GLUCOSE 197*  --  178* 78 170* 134*  BUN 28*  --  27* 26* 27* 26*  CREATININE 0.80  --  0.85 0.74 0.76 0.78  CALCIUM 8.9  --  8.4* 8.8* 8.6* 8.4*  MG  --  2.1 1.9 2.1 1.9 1.9    GFR: Estimated Creatinine Clearance: 67.9 mL/min (by C-G formula based on SCr of 0.78 mg/dL).  Liver Function Tests: Recent Labs  Lab 06/06/20 2137 06/07/20 0823 06/08/20 0438  AST 66* 47* 39  ALT 34 32 30   ALKPHOS 168* 155* 156*  BILITOT 0.5 0.9 0.7  PROT 7.5 7.2 7.4  7.3  ALBUMIN 2.2* 2.1* 2.3*    CBG: Recent Labs  Lab 06/09/20 0741 06/09/20 1209 06/09/20 1643 06/09/20 2025 06/10/20 0744  GLUCAP 156* 252* 268* 314* 158*     Recent Results (from the past 240 hour(s))  Culture, blood (routine x 2)     Status: None (Preliminary result)   Collection Time: 06/07/20  2:10 AM   Specimen: BLOOD  Result Value Ref Range Status   Specimen Description   Final    BLOOD LEFT ANTECUBITAL Performed at [facility], [address], [place], [place] [address]    Special Requests   Final    BOTTLES DRAWN AEROBIC AND ANAEROBIC Blood Culture adequate volume Performed at [facility], [address] [place], [place] [address]    Culture   Final    NO GROWTH 3 DAYS Performed at [facility], [address] [place], [place] [address]    Report Status PENDING  Incomplete  Culture, blood (Routine X 2) w Reflex to ID Panel     Status: None (Preliminary result)   Collection Time: 06/07/20  2:15 AM   Specimen: BLOOD  Result Value Ref Range Status   Specimen Description   Final    BLOOD BLOOD RIGHT HAND Performed at [facility], [address] [place], [place] [address]    Special Requests   Final    BOTTLES DRAWN AEROBIC ONLY Blood Culture adequate volume Performed at [facility], [address], [place], [place] [address]    Culture   Final    NO GROWTH 3 DAYS Performed at [facility], [address] [place], [place] [address]    Report Status PENDING  Incomplete  Resp Panel by RT-PCR (Flu A&B, Covid) Nasopharyngeal Swab     Status: None   Collection Time: 06/07/20  2:26 AM   Specimen: Nasopharyngeal Swab; Nasopharyngeal(NP) swabs in vial transport medium  Result Value Ref Range Status   SARS Coronavirus 2 by RT PCR NEGATIVE NEGATIVE Final    Comment: (NOTE) SARS-CoV-2 target nucleic acids  are NOT DETECTED.  The SARS-CoV-2 RNA is generally detectable in upper respiratory specimens during the acute phase of infection. The lowest concentration of SARS-CoV-2 viral copies this assay can detect is 138 copies/mL. A negative result does not preclude SARS-Cov-2 infection and should not be used as the sole basis for treatment or other patient management decisions. A negative result may occur with  improper specimen collection/handling, submission of specimen other than nasopharyngeal swab, presence of viral mutation(s) within the areas targeted by this assay, and inadequate number of viral copies(<138 copies/mL). A negative result must be combined with clinical observations, patient history, and epidemiological  information. The expected result is Negative.  Fact Sheet for Patients:  BloggerCourse.com  Fact Sheet for Healthcare Providers:  SeriousBroker.it  This test is no t yet approved or cleared by the [place] FDA and  has been authorized for detection and/or diagnosis of SARS-CoV-2 by FDA under an Emergency Use Authorization (EUA). This EUA will remain  in effect (meaning this test can be used) for the duration of the COVID-19 declaration under Section 564(b)(1) of the Act, 21 U.S.C.section 360bbb-3(b)(1), unless the authorization is terminated  or revoked sooner.       Influenza A by PCR NEGATIVE NEGATIVE Final   Influenza B by PCR NEGATIVE NEGATIVE Final    Comment: (NOTE) The Xpert Xpress SARS-CoV-2/FLU/RSV plus assay is intended as an aid in the diagnosis of influenza from Nasopharyngeal swab specimens and should not be used as a sole basis for treatment. Nasal washings and aspirates are unacceptable for Xpert Xpress SARS-CoV-2/FLU/RSV testing.  Fact Sheet for Patients: BloggerCourse.com  Fact Sheet for Healthcare Providers: SeriousBroker.it  This test is  not yet approved or cleared by the [place] FDA and has been authorized for detection and/or diagnosis of SARS-CoV-2 by FDA under an Emergency Use Authorization (EUA). This EUA will remain in effect (meaning this test can be used) for the duration of the COVID-19 declaration under Section 564(b)(1) of the Act, 21 U.S.C. section 360bbb-3(b)(1), unless the authorization is terminated or revoked.  Performed at [facility], [address] [place], [place] [address]   Culture, Urine     Status: Abnormal   Collection Time: 06/07/20  6:38 AM   Specimen: Urine, Random  Result Value Ref Range Status   Specimen Description   Final    URINE, RANDOM Performed at [facility], [address], [place], [place] [address]    Special Requests   Final    NONE Performed at [facility], [address], [place], [place] [address]    Culture MULTIPLE SPECIES PRESENT, SUGGEST RECOLLECTION (A)  Final   Report Status 06/08/2020 FINAL  Final  Body fluid culture w Gram Stain     Status: None (Preliminary result)   Collection Time: 06/07/20 12:44 PM   Specimen: Pleura  Result Value Ref Range Status   Specimen Description   Final    PLEURAL LEFT Performed at [facility], [address] [place], [place] [address]    Special Requests   Final    NONE Performed at [facility], [address], [place], [place] [address]    Gram Stain   Final    RARE WBC PRESENT,BOTH PMN AND MONONUCLEAR NO ORGANISMS SEEN    Culture   Final    NO GROWTH 2 DAYS Performed at [facility], [address] [place], [place] [address]    Report Status PENDING  Incomplete  MRSA PCR Screening     Status: None   Collection Time: 06/09/20  8:12 AM   Specimen: Nasal Mucosa; Nasopharyngeal  Result Value Ref Range Status   MRSA by PCR NEGATIVE NEGATIVE Final    Comment:        The GeneXpert MRSA Assay (FDA approved for  NASAL specimens only), is one component of a comprehensive MRSA colonization surveillance program. It is not intended to diagnose MRSA infection nor to guide or monitor treatment for MRSA infections. Performed at [facility], [address], [place], [place] [address]  Radiology Studies: DG CHEST PORT 1 VIEW  Result Date: 06/10/2020 CLINICAL DATA:  70 year old female with history of pleural effusion. EXAM: PORTABLE CHEST 1 VIEW COMPARISON:  Chest x-ray 06/09/2020. FINDINGS: Small bore left-sided chest tube with tip in the lower left hemithorax. Previously noted left-sided pneumothorax is not confidently identified on today's examination. There does appear to be some accumulated left-sided pleural fluid, much of which extends into the upper left hemithorax laterally. Patchy opacities are noted throughout the left lung, which may reflect residual areas of atelectasis and/or consolidation. Right lung is low volume, but appears well aerated. No right pleural effusion. No evidence of pulmonary edema. Heart size is normal. Upper mediastinal contours are within normal limits allowing for patient positioning. IMPRESSION: 1. Left-sided chest tube is stable in position with resolution of previously noted pneumothorax but persistence of a partially loculated left pleural fluid which is at least moderate in volume. Residual areas of atelectasis and/or consolidation in the left lung. Electronically Signed   By: Trudie Reedaniel  Entrikin M.D.   On: 06/10/2020 05:05   DG CHEST PORT 1 VIEW  Result Date: 06/09/2020 CLINICAL DATA:  Follow-up left pleural effusion and chest tube. EXAM: PORTABLE CHEST 1 VIEW COMPARISON:  06/08/2020. FINDINGS: Left chest tube is stable. There is improvement in left lung aeration now with partial clearing of the left mid to upper lung. There is also evidence of a small left pneumothorax noted along upper lateral aspect of the lung, which may be an ex vacuo  pneumothorax. Mild atelectasis at the medial right lung base. Right lung is otherwise clear. No right pneumothorax or effusion. IMPRESSION: 1. Mild interval improvement. Partial interval clearing of the left lung compared to the previous day's exam. 2. New small left pneumothorax likely an ex vacuo pneumothorax. 3. No other change. Electronically Signed   By: Amie Portlandavid  Ormond M.D.   On: 06/09/2020 08:06        Scheduled Meds: . alteplase (TPA) for intrapleural administration  10 mg Intrapleural Once   And  . pulmozyme (DORNASE) for intrapleural administration  5 mg Intrapleural Once  . amiodarone  200 mg Oral BID  . amLODipine  5 mg Oral Daily  . atorvastatin  80 mg Oral Daily  . ferrous gluconate  324 mg Oral Daily  . hydrALAZINE  50 mg Oral Q8H  . insulin aspart  0-5 Units Subcutaneous QHS  . insulin aspart  0-9 Units Subcutaneous TID WC  . insulin glargine  15 Units Subcutaneous Daily  . isosorbide mononitrate  60 mg Oral Daily  . magnesium oxide  400 mg Oral Daily  . polyethylene glycol  17 g Oral BID  . scopolamine  1 patch Transdermal Q72H  . senna-docusate  1 tablet Oral BID  . sorbitol, milk of mag, mineral oil, glycerin (SMOG) enema  960 mL Rectal Once   Continuous Infusions: . sodium chloride 250 mL (06/10/20 0306)  . ceFEPime (MAXIPIME) IV 2 g (06/10/20 0311)  . metronidazole 500 mg (06/10/20 0349)     LOS: 3 days    Time spent: 25 minutes    Joseph ArtJessica U Damyn Weitzel, DO [facility]   To contact the attending provider between 7A-7P or the covering provider during after hours 7P-7A, please log into the web site [email or web address] and access using universal [place] password for that web site. If you do not have the password, please call the hospital operator.  06/10/2020, 9:18 AM

## 2020-06-10 NOTE — Progress Notes (Signed)
Referring Physician(s): Dewald,J/Thompson,D  Supervising Physician: Daryll Brod  Patient Status:  [facility] - In-pt  Chief Complaint: Complex left pleural effusion   Subjective: Patient currently without new complaints; states breathing is a little better since chest tube placed; has some soreness at tube insertion site; tPA and dornase injected into chest tube by CCM today   Allergies: Amiodarone, Fruit & vegetable daily [nutritional supplements], Metoprolol, Other, Peanut-containing drug products, Aspirin, Tylenol [acetaminophen], Beef-derived products, and Lactose intolerance (gi)  Medications: Prior to Admission medications   Medication Sig Start Date End Date Taking? Authorizing Provider  amiodarone (PACERONE) 200 MG tablet Take 1 tablet (200 mg total) by mouth 2 (two) times daily. 05/26/20  Yes Bonnielee Haff, MD  amLODipine (NORVASC) 5 MG tablet Take 5 mg by mouth daily.   Yes [provider]  atorvastatin (LIPITOR) 80 MG tablet Take 1 tablet (80 mg total) by mouth daily. 05/27/20  Yes Bonnielee Haff, MD  bisacodyl (DULCOLAX) 10 MG suppository Place 1 suppository (10 mg total) rectally daily as needed for moderate constipation. 05/26/20  Yes Bonnielee Haff, MD  digoxin (LANOXIN) 0.125 MG tablet Take 1 tablet (0.125 mg total) by mouth daily. 05/27/20  Yes Bonnielee Haff, MD  ELIQUIS 5 MG TABS tablet TAKE 1 TABLET(5 MG) BY MOUTH TWICE DAILY Patient taking differently: Take 5 mg by mouth 2 (two) times daily. 04/10/20  Yes Tobb, Kardie, DO  feeding supplement, GLUCERNA SHAKE, (GLUCERNA SHAKE) LIQD Take 237 mLs by mouth 3 (three) times daily between meals.   Yes [provider]  ferrous gluconate (FERGON) 324 MG tablet Take 324 mg by mouth daily.   Yes [provider]  hydrALAZINE (APRESOLINE) 10 MG tablet Take 1 tablet (10 mg total) by mouth every 8 (eight) hours. 05/26/20  Yes Bonnielee Haff, MD  insulin aspart (NOVOLOG) 100 UNIT/ML injection Inject into  the skin 3 (three) times daily before meals. If BGC is less than 80, call MD  201-250, 2 units  251-300, 4 units 301-350, 6 units 351-400, 8 units 401-450, 10 units 451-500, 12 units 500 or more give 14 units of NovoLog, and recheck BCG in 2 hours   Yes [provider]  insulin glargine (LANTUS) 100 UNIT/ML Solostar Pen Inject 18 Units into the skin daily. Patient taking differently: Inject 15 Units into the skin See admin instructions. Inject 20 units into skin at 3 PM daily 02/07/20  Yes Anderson, Chelsey L, DO  Isosorbide Mononitrate (IMDUR PO) Take 20 mg by mouth 2 (two) times daily.   Yes [provider]  Lidocaine (ASPERCREME LIDOCAINE) 4 % PTCH Apply 1 patch topically 2 (two) times daily. Apply to upper back   Yes [provider]  Magnesium Oxide 400 MG CAPS Take 1 capsule (400 mg total) by mouth daily. 08/17/19  Yes Weaver, Scott T, PA-C  metaxalone (SKELAXIN) 800 MG tablet Take 1 tablet by mouth in the morning, at noon, and at bedtime. 05/29/20  Yes [provider]  Multiple Vitamin (MULTIVITAMIN WITH MINERALS) TABS tablet Take 1 tablet by mouth daily. 05/27/20  Yes Bonnielee Haff, MD  oxyCODONE (OXY IR/ROXICODONE) 5 MG immediate release tablet Take 1 tablet (5 mg total) by mouth every 6 (six) hours as needed for severe pain. 05/26/20  Yes Bonnielee Haff, MD  pantoprazole (PROTONIX) 40 MG tablet Take 1 tablet (40 mg total) by mouth daily. 05/27/20  Yes Bonnielee Haff, MD  polyethylene glycol (MIRALAX / GLYCOLAX) 17 g packet Take 17 g by mouth 2 (two) times  daily. 05/26/20  Yes Bonnielee Haff, MD  senna-docusate (SENOKOT-S) 8.6-50 MG tablet Take 2 tablets by mouth 2 (two) times daily. 05/26/20  Yes Bonnielee Haff, MD  acetaminophen (TYLENOL) 325 MG tablet Take 650 mg by mouth 3 (three) times daily as needed for mild pain or moderate pain.    [provider]  B-D UF III MINI PEN NEEDLES 31G X 5 MM MISC USE WITH LANTUS PEN DAILY AS DIRECTED 02/11/20    Carollee Leitz, MD  Blood Pressure KIT Monitor blood pressure twice a day 09/28/19   Carollee Leitz, MD  glucose blood (ACCU-CHEK AVIVA) test strip Use as instructed 06/16/17   Zenia Resides, MD  Lancets (ACCU-CHEK SOFT TOUCH) lancets Once daily testing. 06/16/17   Zenia Resides, MD     Vital Signs: BP (!) 134/55 (BP Location: Left Arm)   Pulse 61   Temp 97.8 F (36.6 C) (Oral)   Resp 20   Wt 178 lb 12.7 oz (81.1 kg)   SpO2 100%   BMI 31.67 kg/m   Physical Exam awake, alert.  Left chest tube intact, dressing clean and dry, site mildly tender to palpation, Pleur-evac to wall suction but catheter currently clamped following tPA/dornase infusion; latest recorded output 890 cc blood-tinged fluid  Imaging: CT Abdomen Pelvis Wo Contrast  Result Date: 06/06/2020 CLINICAL DATA:  Right lower quadrant abdominal pain, nausea EXAM: CT ABDOMEN AND PELVIS WITHOUT CONTRAST TECHNIQUE: Multidetector CT imaging of the abdomen and pelvis was performed following the standard protocol without IV contrast. COMPARISON:  05/13/2020, 05/12/2020, 07/20/2015 FINDINGS: Lower chest: There is a large multiloculated heterogeneous left pleural effusion, with areas of higher attenuation seen anteriorly and posteriorly. There is complete atelectasis of the visualized left lung. Right lung is clear. The heart is unremarkable. Unenhanced CT was performed per clinician order. Lack of IV contrast limits sensitivity and specificity, especially for evaluation of abdominal/pelvic solid viscera. Hepatobiliary: No focal liver abnormality is seen. Status post cholecystectomy. No biliary dilatation. Pancreas: Unremarkable. No pancreatic ductal dilatation or surrounding inflammatory changes. Spleen: Indeterminate 1.3 cm hypodensity is seen within the posterior aspect of the spleen, image 37/2. Spleen is normal in size. Adrenals/Urinary Tract: No urinary tract calculi or obstructive uropathy within either kidney. The adrenals and bladder  are unremarkable. Stomach/Bowel: No bowel obstruction or ileus. Normal appendix right lower quadrant. No bowel wall thickening or inflammatory change. Vascular/Lymphatic: Aortic atherosclerosis. No enlarged abdominal or pelvic lymph nodes. Reproductive: Status post hysterectomy. No adnexal masses. Other: No free fluid or free gas.  No abdominal wall hernia. Musculoskeletal: There are subacute healing right anterolateral fourth and fifth rib fractures with minimal callus formation. No acute bony abnormalities. Reconstructed images demonstrate no additional findings. IMPRESSION: 1. Large complex loculated left pleural effusion, with complete atelectasis of the visualized left lower lobe. Differential diagnosis would include empyema or sequela of hemothorax. Dedicated CT of the chest may be useful to visualize this abnormality in its entirety. 2. No acute intra-abdominal or intrapelvic process. Normal appendix right lower quadrant. 3. Indeterminate hypodensity posterior aspect of the spleen, which could reflect a small cyst or hemangioma. 4.  Aortic Atherosclerosis (ICD10-I70.0). Electronically Signed   By: Randa Ngo M.D.   On: 06/06/2020 23:02   DG Chest 2 View  Result Date: 06/06/2020 CLINICAL DATA:  Large left effusion on recent CT of the abdomen EXAM: CHEST - 2 VIEW COMPARISON:  05/13/2020 FINDINGS: Delete opacification of the left hemithorax is noted. Cardiac shadow is stable. Right lung is clear. No acute  bony abnormality is noted. IMPRESSION: Opacification of the left hemithorax consistent with the complex diffusion seen on recent CT of the abdomen. Electronically Signed   By: Inez Catalina M.D.   On: 06/06/2020 23:22   CT Chest Wo Contrast  Result Date: 06/07/2020 CLINICAL DATA:  Left pleural effusion, pneumonia, abnormal chest x-ray and abdominal CT EXAM: CT CHEST WITHOUT CONTRAST TECHNIQUE: Multidetector CT imaging of the chest was performed following the standard protocol without IV contrast.  COMPARISON:  04/06/2010, 06/06/2020 FINDINGS: Cardiovascular: Unenhanced imaging of the heart and great vessels demonstrates no pericardial effusion. There is normal caliber of the thoracic aorta. Mild atherosclerosis of the aorta and coronary vessels. Mediastinum/Nodes: Chronic heterogeneous enlargement of the right lobe of the thyroid has been unchanged since 2012, compatible with goiter. This is been previously evaluated by ultrasound. Trachea and esophagus are unremarkable. Subcentimeter mediastinal adenopathy identified, largest measuring 8 mm in the AP window. Lungs/Pleura: There is complete atelectasis of the left lung. Heterogeneous multilocular left pleural effusion is identified, occupying the entire left hemithorax. Areas of higher attenuation are seen within the fluid collection anteriorly and posteriorly at the level of the hilum. Scattered hypoventilatory changes are seen within the right lung. Otherwise the right hemithorax is clear. Central airways are patent. Upper Abdomen: No acute abnormality. Musculoskeletal: No acute or destructive bony lesions. Reconstructed images demonstrate no additional findings. IMPRESSION: 1. Large complex multilocular left pleural effusion. This has increased in size since recent CT of the neck dated 05/12/2020. Differential includes empyema, sequela of previous hemothorax, or neoplasm. Correlation with cytology after thoracentesis may be useful. 2. Complete left lung atelectasis. 3. Stable heterogeneous enlargement right lobe thyroid compatible with goiter. This has been evaluated on previous imaging. Please refer to thyroid ultrasound dated 05/14/2020. (Ref: J Am Coll Radiol. 2015 Feb;12(2): 143-50). 4.  Aortic Atherosclerosis (ICD10-I70.0). Electronically Signed   By: Randa Ngo M.D.   On: 06/07/2020 00:31   US Abdomen Complete  Result Date: 06/07/2020 CLINICAL DATA:  Abdominal and back pain EXAM: ABDOMEN ULTRASOUND COMPLETE COMPARISON:  06/06/2020 FINDINGS:  Gallbladder: Surgically absent. Common bile duct: Diameter: 5 mm Liver: No focal lesion identified. Within normal limits in parenchymal echogenicity. Portal vein is patent on color Doppler imaging with normal direction of blood flow towards the liver. IVC: No abnormality visualized. Pancreas: Visualized portion unremarkable. Spleen: Normal size and appearance. Evaluation is limited due to overlying bandaging from left chest tube. The splenic hypodensity seen on CT is not visualized by ultrasound. Right Kidney: Length: 10.5 cm. Echogenicity within normal limits. No mass or hydronephrosis visualized. Left Kidney: Length: 10.9 cm. Echogenicity within normal limits. No mass or hydronephrosis visualized. Abdominal aorta: No aneurysm visualized. Other findings: None. IMPRESSION: 1. Grossly unremarkable abdominal ultrasound. 2. Limited evaluation of the spleen given overlying bandage material related to left-sided chest tube. The hypodensity seen on CT is not visualized by ultrasound. Electronically Signed   By: Randa Ngo M.D.   On: 06/07/2020 21:55   DG CHEST PORT 1 VIEW  Result Date: 06/10/2020 CLINICAL DATA:  70 year old female with history of pleural effusion. EXAM: PORTABLE CHEST 1 VIEW COMPARISON:  Chest x-ray 06/09/2020. FINDINGS: Small bore left-sided chest tube with tip in the lower left hemithorax. Previously noted left-sided pneumothorax is not confidently identified on today's examination. There does appear to be some accumulated left-sided pleural fluid, much of which extends into the upper left hemithorax laterally. Patchy opacities are noted throughout the left lung, which may reflect residual areas of atelectasis and/or consolidation. Right  lung is low volume, but appears well aerated. No right pleural effusion. No evidence of pulmonary edema. Heart size is normal. Upper mediastinal contours are within normal limits allowing for patient positioning. IMPRESSION: 1. Left-sided chest tube is stable in  position with resolution of previously noted pneumothorax but persistence of a partially loculated left pleural fluid which is at least moderate in volume. Residual areas of atelectasis and/or consolidation in the left lung. Electronically Signed   By: Vinnie Langton M.D.   On: 06/10/2020 05:05   DG CHEST PORT 1 VIEW  Result Date: 06/09/2020 CLINICAL DATA:  Follow-up left pleural effusion and chest tube. EXAM: PORTABLE CHEST 1 VIEW COMPARISON:  06/08/2020. FINDINGS: Left chest tube is stable. There is improvement in left lung aeration now with partial clearing of the left mid to upper lung. There is also evidence of a small left pneumothorax noted along upper lateral aspect of the lung, which may be an ex vacuo pneumothorax. Mild atelectasis at the medial right lung base. Right lung is otherwise clear. No right pneumothorax or effusion. IMPRESSION: 1. Mild interval improvement. Partial interval clearing of the left lung compared to the previous day's exam. 2. New small left pneumothorax likely an ex vacuo pneumothorax. 3. No other change. Electronically Signed   By: Lajean Manes M.D.   On: 06/09/2020 08:06   DG CHEST PORT 1 VIEW  Result Date: 06/08/2020 CLINICAL DATA:  Pleural effusion EXAM: PORTABLE CHEST 1 VIEW COMPARISON:  CT chest dated 06/07/2020 FINDINGS: Near complete opacification of the left hemithorax, related to the patient's known complex pleural effusion/hemothorax. Mild lucency at the lung base with indwelling pigtail drainage catheter. Right lung is clear. The heart is partially obscured. IMPRESSION: Near complete opacification of the left hemithorax, now with mild lucency at the left lung base, with indwelling pigtail drainage catheter. Electronically Signed   By: Julian Hy M.D.   On: 06/08/2020 05:46   CT [facility] PLEURAL DRAIN W/INDWELL CATH W/IMG GUIDE  Result Date: 06/07/2020 INDICATION: Large left-sided pleural effusion, worrisome for hemothorax given history of recent CPR  with subsequent initiation anticoagulation. Please perform image guided left-sided chest tube placement for therapeutic and diagnostic purposes. EXAM: CT PERC PLEURAL DRAIN W/INDWELL CATH W/IMG GUIDE COMPARISON:  Chest CT-06/07/2020 MEDICATIONS: The patient is currently admitted to the hospital and receiving intravenous antibiotics. The antibiotics were administered within an appropriate time frame prior to the initiation of the procedure. ANESTHESIA/SEDATION: Moderate (conscious) sedation was employed during this procedure. A total of Versed 2 mg and Fentanyl 100 mcg was administered intravenously. Moderate Sedation Time: 19 minutes. The patient's level of consciousness and vital signs were monitored continuously by radiology nursing throughout the procedure under my direct supervision. CONTRAST:  None COMPLICATIONS: None immediate. PROCEDURE: Informed written consent was obtained from the patient after a discussion of the risks, benefits and alternatives to treatment. The patient was placed supine, slightly RPO on the CT gantry and a pre procedural CT was performed re-demonstrating the known large likely partially loculated left-sided pleural effusion. The procedure was planned. A timeout was performed prior to the initiation of the procedure. The skin overlying the inferolateral aspect of left chest was prepped and draped in the usual sterile fashion. The overlying soft tissues were anesthetized with 1% lidocaine with epinephrine. Appropriate trajectory was planned with the use of a 22 gauge spinal needle. An 18 gauge trocar needle was advanced into the abscess/fluid collection and a short Amplatz super stiff wire was coiled within the collection. Appropriate positioning was  confirmed with a limited CT scan. The tract was serially dilated allowing placement of a 14 French all-purpose drainage catheter. Appropriate positioning was confirmed with a limited postprocedural CT scan. Approximately 950 cc of bloody  fluid was aspirated. The tube was connected to a pleura vac device and sutured in place. A dressing was applied. The patient tolerated the procedure well without immediate post procedural complication. IMPRESSION: Successful CT guided placement of a 62 French all purpose drain catheter into the basilar aspect the left pleural space with aspiration of 950 cc of bloody fluid. Samples were sent to the laboratory as requested by the ordering clinical team. Electronically Signed   By: Sandi Mariscal M.D.   On: 06/07/2020 15:16    Labs:  CBC: Recent Labs    06/07/20 0823 06/08/20 0438 06/09/20 0528 06/10/20 0431  WBC 11.9* 10.8* 10.0 10.3  HGB 9.1* 9.4* 10.6* 9.8*  HCT 29.8* 31.3* 35.7* 32.8*  PLT 481* 556* 586* 557*    COAGS: Recent Labs    07/31/19 0255 05/12/20 1220 05/14/20 0507 05/19/20 1045 06/07/20 1533  INR 1.3* 1.3*  --  1.3* 1.8*  APTT  --  26 92*  --   --     BMP: Recent Labs    08/31/19 0948 11/09/19 1201 02/03/20 1445 03/03/20 1559 03/05/20 1524 03/18/20 1645 06/07/20 0823 06/08/20 0438 06/09/20 0528 06/10/20 0431  NA 142 145*   < > 138 139   < > 137 140 137 137  K 4.4 4.5   < > 5.1 4.8   < > 4.8 4.5 5.0 5.1  CL 102 105   < > 104 102   < > 100 104 102 103  CO2 28 27   < > 21 24   < > 33* 32 30 31  GLUCOSE 100* 139*   < > 161* 197*   < > 178* 78 170* 134*  BUN 13 13   < > 16 17   < > 27* 26* 27* 26*  CALCIUM 9.7 9.6   < > 9.1 8.9   < > 8.4* 8.8* 8.6* 8.4*  CREATININE 0.77 0.80   < > 0.96 1.08*   < > 0.85 0.74 0.76 0.78  GFRNONAA 80 77   < > 61 53*   < > >60 >60 >60 >60  GFRAA 92 88  --  70 61  --   --   --   --   --    < > = values in this interval not displayed.    LIVER FUNCTION TESTS: Recent Labs    05/23/20 0155 06/06/20 2137 06/07/20 0823 06/08/20 0438  BILITOT 1.1 0.5 0.9 0.7  AST 24 66* 47* 39  ALT 25 34 32 30  ALKPHOS 151* 168* 155* 156*  PROT 6.6 7.5 7.2 7.4  7.3  ALBUMIN 2.2* 2.2* 2.1* 2.3*    Assessment and Plan: Patient with  history of complex left pleural effusion/?hemothorax, status post drain placement 5/28; afebrile, WBC normal, hemoglobin 9.8 down from 10.6, creatinine normal, pleural fluid cultures negative to date; CXR today revealed: Left-sided chest tube is stable in position with resolution of previously noted pneumothorax but persistence of a partially loculated left pleural fluid which is at least moderate in volume. Residual areas of atelectasis and/or consolidation in the left lung  Patient received injection of tPA/dornase into tube today by CCM; further plans as per their service  Electronically Signed: D. Rowe Robert, PA-C 06/10/2020, 11:57 AM   I spent a  total of 15 minutes at the the patient's bedside AND on the patient's hospital floor or unit, greater than 50% of which was counseling/coordinating care for left chest tube    Patient ID: Manfred Arch, female   DOB: [birth year], 70 y.o.   MRN: [id number]

## 2020-06-10 NOTE — Progress Notes (Signed)
Pharmacy Antibiotic Note  [name] [name] is a 70 y.o. female admitted on 06/06/2020 with past medical history of CHF, diabetes, paroxysmal A. Fib on Eliquis, chronic digoxin, recent hospital admission with/for CVA treated with tPA and mechanical thrombectomy, post cardiac arrest this month  Pharmacy has been consulted to dose cefepime (and flagyl) for PNA with recent hospitalization, CT chest concerning for empyema vs hemothorax.  Day #4 Cefepime/Flagyl  Afebrile  WBC 10.3  SCr stable, CrCl ~67 ml/min  Cultures neg to date  Plan: Continue Cefepime 2gm IV q8h Continue Flagyl per MD Follow renal function, cultures and clinical course  Weight: 81.1 kg (178 lb 12.7 oz)  Temp (24hrs), Avg:97.8 F (36.6 C), Min:97.7 F (36.5 C), Max:97.8 F (36.6 C)  Recent Labs  Lab 06/06/20 2137 06/07/20 0823 06/08/20 0438 06/09/20 0528 06/10/20 0431  WBC 12.9* 11.9* 10.8* 10.0 10.3  CREATININE 0.80 0.85 0.74 0.76 0.78    Estimated Creatinine Clearance: 67.9 mL/min (by C-G formula based on SCr of 0.78 mg/dL).    Allergies  Allergen Reactions  . Amiodarone Shortness Of Breath and Other (See Comments)    Soreness, wheezing, and weakness also  . Fruit & Vegetable Daily [Nutritional Supplements] Shortness Of Breath, Swelling and Other (See Comments)    All fruits cause tongue swelling and numbness Organic fruits are okay to eat  . Metoprolol Shortness Of Breath, Other (See Comments) and Cough    Soreness, wheezing, and weakness also  . Other Shortness Of Breath, Swelling and Other (See Comments)    Tree nuts cause tongue swelling and numbness  . Peanut-Containing Drug Products Shortness Of Breath, Swelling and Other (See Comments)    Tongue swelling and numbness  . Aspirin Nausea And Vomiting and Other (See Comments)    Cannot take due to liver issues  . Tylenol [Acetaminophen] Other (See Comments)    Cannot take due to liver issues- tolerating 650 mg tablets in 2022  . Beef-Derived  Products Other (See Comments)    Limited, per patient  . Lactose Intolerance (Gi) Diarrhea and Nausea And Vomiting    Antimicrobials this admission: 5/28 cefepime >> 5/28 vanc >> 5/30 5/28 flagyl >>  Microbiology results: 5/28 BCx: ngtd 5/28 UCx: mx spp, final 5/28 pleural effusion: ngtd - fungus Cx: sent 5/30 MRSA PCR: neg  Thank you for allowing pharmacy to be a part of this patient's care.  Loralee Pacas, PharmD, BCPS Pharmacy: [phone] 06/10/2020, 9:15 AM

## 2020-06-10 NOTE — Progress Notes (Addendum)
Progress Note  Patient Name: [name] [name] Date of Encounter: 06/10/2020  [facility] Cardiologist: Thomasene Ripple, DO   Subjective   Patient is sitting in bed, resting comfortably. She denied worsening SOB. She c/o chest wall tenderness on light palpitation. She states her chest and abdomen had been sore since her last hospitalization. She is constipated and suppose to get an enema today.   Inpatient Medications    Scheduled Meds: . amiodarone  200 mg Oral BID  . amLODipine  5 mg Oral Daily  . atorvastatin  80 mg Oral Daily  . ferrous gluconate  324 mg Oral Daily  . hydrALAZINE  50 mg Oral Q8H  . insulin aspart  0-5 Units Subcutaneous QHS  . insulin aspart  0-9 Units Subcutaneous TID WC  . insulin glargine  15 Units Subcutaneous Daily  . isosorbide mononitrate  60 mg Oral Daily  . magnesium oxide  400 mg Oral Daily  . polyethylene glycol  17 g Oral BID  . scopolamine  1 patch Transdermal Q72H  . senna-docusate  1 tablet Oral BID  . sorbitol, milk of mag, mineral oil, glycerin (SMOG) enema  960 mL Rectal Once   Continuous Infusions: . sodium chloride 250 mL (06/10/20 0306)  . ceFEPime (MAXIPIME) IV 2 g (06/10/20 0311)  . metronidazole 500 mg (06/10/20 0349)   PRN Meds: sodium chloride, hydrALAZINE, HYDROmorphone (DILAUDID) injection, ketorolac, oxyCODONE   Vital Signs    Vitals:   06/09/20 1324 06/09/20 2026 06/10/20 0500 06/10/20 0548  BP: (!) 129/50 139/78  (!) 134/55  Pulse: 73 67  61  Resp: (!) 22 18  20   Temp: 97.7 F (36.5 C) 97.8 F (36.6 C)  97.8 F (36.6 C)  TempSrc: Oral Oral  Oral  SpO2: 97% 100%  100%  Weight:   81.1 kg     Intake/Output Summary (Last 24 hours) at 06/10/2020 0743 Last data filed at 06/10/2020 0542 Gross per 24 hour  Intake 2080.53 ml  Output 1140 ml  Net 940.53 ml   Last 3 Weights 06/10/2020 06/09/2020 06/08/2020  Weight (lbs) 178 lb 12.7 oz 180 lb 8.9 oz 179 lb 10.8 oz  Weight (kg) 81.1 kg 81.9 kg 81.5 kg      Telemetry     Sinus rhythm 60s - Personally Reviewed  ECG    No new tracing today - Personally Reviewed  Physical Exam   GEN: No acute distress.   Neck: No JVD Cardiac: RRR, no murmurs, rubs, or gallops. Chest wall tenderness on palpation  Respiratory: Clear to auscultation bilaterally. On 2LNC. Left side chest tube in place, sanguineous drainage noted in collection chamber  GI: Abdomen obese Soft, nontender, non-distended  MS: No BLE edema; No deformity. Neuro:  Nonfocal  Psych: Normal affect   Labs    High Sensitivity Troponin:   Recent Labs  Lab 05/12/20 1550 06/07/20 0735  TROPONINIHS 99* 35*      Chemistry Recent Labs  Lab 06/06/20 2137 06/07/20 0823 06/08/20 0438 06/09/20 0528 06/10/20 0431  NA 138 137 140 137 137  K 4.7 4.8 4.5 5.0 5.1  CL 98 100 104 102 103  CO2 37* 33* 32 30 31  GLUCOSE 197* 178* 78 170* 134*  BUN 28* 27* 26* 27* 26*  CREATININE 0.80 0.85 0.74 0.76 0.78  CALCIUM 8.9 8.4* 8.8* 8.6* 8.4*  PROT 7.5 7.2 7.4  7.3  --   --   ALBUMIN 2.2* 2.1* 2.3*  --   --   AST  66* 47* 39  --   --   ALT 34 32 30  --   --   ALKPHOS 168* 155* 156*  --   --   BILITOT 0.5 0.9 0.7  --   --   GFRNONAA >60 >60 >60 >60 >60  ANIONGAP 3* 4* 4* 5 3*     Hematology Recent Labs  Lab 06/08/20 0438 06/09/20 0528 06/10/20 0431  WBC 10.8* 10.0 10.3  RBC 3.76* 4.24 3.97  HGB 9.4* 10.6* 9.8*  HCT 31.3* 35.7* 32.8*  MCV 83.2 84.2 82.6  MCH 25.0* 25.0* 24.7*  MCHC 30.0 29.7* 29.9*  RDW 18.8* 18.7* 18.3*  PLT 556* 586* 557*    BNP Recent Labs  Lab 06/07/20 0735  BNP 707.6*     DDimer No results for input(s): DDIMER in the last 168 hours.   Radiology    DG CHEST PORT 1 VIEW  Result Date: 06/10/2020 CLINICAL DATA:  70 year old female with history of pleural effusion. EXAM: PORTABLE CHEST 1 VIEW COMPARISON:  Chest x-ray 06/09/2020. FINDINGS: Small bore left-sided chest tube with tip in the lower left hemithorax. Previously noted left-sided pneumothorax is not  confidently identified on today's examination. There does appear to be some accumulated left-sided pleural fluid, much of which extends into the upper left hemithorax laterally. Patchy opacities are noted throughout the left lung, which may reflect residual areas of atelectasis and/or consolidation. Right lung is low volume, but appears well aerated. No right pleural effusion. No evidence of pulmonary edema. Heart size is normal. Upper mediastinal contours are within normal limits allowing for patient positioning. IMPRESSION: 1. Left-sided chest tube is stable in position with resolution of previously noted pneumothorax but persistence of a partially loculated left pleural fluid which is at least moderate in volume. Residual areas of atelectasis and/or consolidation in the left lung. Electronically Signed   By: Trudie Reed M.D.   On: 06/10/2020 05:05   DG CHEST PORT 1 VIEW  Result Date: 06/09/2020 CLINICAL DATA:  Follow-up left pleural effusion and chest tube. EXAM: PORTABLE CHEST 1 VIEW COMPARISON:  06/08/2020. FINDINGS: Left chest tube is stable. There is improvement in left lung aeration now with partial clearing of the left mid to upper lung. There is also evidence of a small left pneumothorax noted along upper lateral aspect of the lung, which may be an ex vacuo pneumothorax. Mild atelectasis at the medial right lung base. Right lung is otherwise clear. No right pneumothorax or effusion. IMPRESSION: 1. Mild interval improvement. Partial interval clearing of the left lung compared to the previous day's exam. 2. New small left pneumothorax likely an ex vacuo pneumothorax. 3. No other change. Electronically Signed   By: Amie Portland M.D.   On: 06/09/2020 08:06    Cardiac Studies   Echo from 05/12/20:  1. Left ventricular ejection fraction, by estimation, is <20%. The left  ventricle has severely decreased function. The left ventricle demonstrates  global hypokinesis. There is severe concentric  left ventricular  hypertrophy. Diastolic function is indeterminant due to atrial fibrillation. No LV thrombus visualized,  however, apex incompletely visualized due to lack of definity contrast.  2. Right ventricular systolic function is severely reduced. The right  ventricular size is mildly enlarged. There is mildly elevated pulmonary  artery systolic pressure. The estimated right ventricular systolic  pressure is 40.6 mmHg.  3. A large, circumferential pericardial effusion is present. The effusion  appears to be greatest around the posterolateral LV measuring up to 2.2cm  at end-diastole.  There is no RV or RA collapse to suggest tamponade. IVC  is dilated and not collapsible but patient is on the vent with significant TR, which are likely the  primary drivers of IVC dilation.  4. The mitral valve is normal in structure. Mild mitral valve regurgitation.  5. Tricuspid valve regurgitation is moderate to severe.  6. The aortic valve is tricuspid. There is mild calcification of the  aortic valve. There is mild thickening of the aortic valve. Aortic valve  regurgitation is trivial. Mild aortic valve sclerosis is present, with no  evidence of aortic valve stenosis.  7. Left atrial size was mildly dilated.   Comparison(s): Compared to prior TTE in 01/2020, the LVEF is now severely  reduced (<20%) and a large pericardial effusion is present (previously  small). The RV systolic function now appears severely reduced.  Patient Profile     70 y.o. female with a complex PMH of systolic and diastolic heart failure (<20%), paroxysmal A. 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 She did require TEE with DCCV 05/19/20 for atrial tachycardia. She was last discharged to SNF on 05/26/20 and returned to ER on 06/06/20 for  right-sided abdominal pain, nausea, and vomiting. She was found to have large left loculated pleural effusion, started on empiric antibiotic, underwent thoracentesis by IR. Cardiology is consulted and following for CHF and chest pain.   Assessment & Plan    Complex multilocular left pleural effusion -s/p CT guided 86fr pigtail catheter placement by IR, exudative effusion based on light's criteria, suspect hemothorax from recent  resuscitation, TPA/DNAse therapy initiated 5/29 per PCCM, anticoagulation with Eliquis is currently held, please resume eliquis when medically cleared   Persistent A fib/A flutter/atrial tachycardia  - s/p TEE with DCCV on 05/19/20 for atrial tachycardia with successful conversion to NSR - continue amiodarone 200mg  BID x 2 weeks, digoxin was discontinued this admission due to borderline elevated therapeutic level,  historically not tolerating AVN blocking agents due to bradycardia and pauses   Chronic systolic and diastolic heart failure  NICM - EF was 40-45% in 07/2019 and now <20% from 05/12/20 Echo - likely tachycardia induced CM + non-compliance with GDMT, failed to follow up for ischemic and infiltrative CM workup in the past  - clinically euvolemic, weight stable around 180 pounds, Net + 1L since admission - digoxin discontinued as above, spironolactone discontinued due to hyperkalemia, continue amiodarone for rhythm control, continue Imdur and hydralazine, consider discontinue amlodipine in the setting of reduced EF, lasix 40mg  PRN if weight >190 pounds(per advanced heart failure team), plan on adding Entresto if renal tolerating.   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  Patient examined.  I agree with the findings as above.  She has some diffuse pain and pain at the chest tube site.  Denies any acute SOB.  The patient exam reveals COR:RRR  ,  Lungs: Clear  ,  Abd: Positive bowel sounds, no rebound no guarding, Ext No edema  .  All available labs, radiology testing, previous records reviewed. Agree with documented assessment and plan.   Dilated cardiomyopathy.  I will stop the Norvasc and increase hydral/nitrates.  Looking at her high normal potassium and previous response I am reluctant to retry Ali Lowe  10:33 AM  06/10/2020

## 2020-06-10 NOTE — Progress Notes (Signed)
Initial Nutrition Assessment  DOCUMENTATION CODES:   Not applicable  INTERVENTION:  - will order Boost Breeze once/day, each supplement provides 250 kcal and 9 grams of protein. - will order Ensure Enlive BID, each supplement provides 350 kcal and 20 grams of protein. - will order 1 tablet multivitamin with minerals/day. - complete NFPE when feasible.    NUTRITION DIAGNOSIS:   Inadequate oral intake related to acute illness,decreased appetite as evidenced by per patient/family report.  GOAL:   Patient will meet greater than or equal to 90% of their needs  MONITOR:   PO intake,Supplement acceptance,Labs,Weight trends  REASON FOR ASSESSMENT:   Consult Assessment of nutrition requirement/status  ASSESSMENT:   70 y.o. female with medical history of CHF with EF less than 20%, A. fib on anticoagulation, HTN, HLD, insulin-dependent type 2 DM, chronic anemia, and bipolar disorder. She was hospitalized earlier in the month for acute embolic stroke; hospital course complicated by cardiogenic shock/PEA cardiac arrest. She was discharged to [facility] on 05/26/20. Patient presented to the ED with R-sided abdominal pain, N/V, and shortness of breath when speaking.  The only documented meal intakes since admission were 25% of breakfast and lunch on 5/29.  Patient laying in bed with no family or visitors present at the time of RD visit. Patient reports being in pain; she grimaces multiple times throughout visit and is restless. She reports pain is in abdominal area and across her back. She has not had meal today and is open to RD ordering a late lunch meal for her.   Patient was last seen by a [place] RD on 05/22/20 while she was at [facility]. At that time she reported decreased appetite and early satiety.   Patient endorses ongoing decreased appetite. She does not eat fried foods and eats very little meat. She enjoys fish.   Unable to obtain any further information from patient at  this time. RN alerted to patient being in pain.   Weight today is 179 lb, weight on 5/29 was 180 lb, weight on 03/05/20 was 206 lb, and weight on 02/03/20 was 164 lb. Will continue to monitor closely.   Non-pitting edema to all extremities documented in the edema section of flow sheet.   Per notes: - chest pain thought to be 2/2 large L pleural effusion - R-sided abdominal pain--thought to be 2/2 rib fractures  - recent embolic CVA - non-ischemic cardiomyopathy - poorly controlled type 2 DM with HgbA1c of 10.5% on 5/3 - constipation   Labs reviewed; CBGs: 158 and 199 mg/dl, BUN: 26 mg/dl, Ca: 8.4 mg/dl. Medications reviewed; sliding scale novolog, 15 units lantus/day, 400 mg mag-ox/day, 17 g miralax BID, 1 tablet senokot BID.    NUTRITION - FOCUSED PHYSICAL EXAM:  unable to complete at this time.   Diet Order:   Diet Order            Diet Heart Room service appropriate? Yes; Fluid consistency: Thin  Diet effective now                 EDUCATION NEEDS:   Not appropriate for education at this time  Skin:  Skin Assessment: Reviewed RN Assessment  Last BM:  5/28 (per patient)  Height:   Ht Readings from Last 1 Encounters:  05/19/20 5\' 3"  (1.6 m)    Weight:   Wt Readings from Last 1 Encounters:  06/10/20 81.1 kg    Estimated Nutritional Needs:  Kcal:  1600-1800 kcal Protein:  80-90 grams Fluid:  >/=  1.7 L/day      Trenton Gammon, MS, RD, LDN, CNSC Inpatient Clinical Dietitian RD pager # available in AMION  After hours/weekend pager # available in [facility]

## 2020-06-10 NOTE — TOC Progression Note (Signed)
Transition of Care [facility]) - Progression Note    Patient Details  Name: [name] [name] MRN: [id number] Date of Birth: 1950/07/24  Transition of Care [facility]) CM/SW Contact  Vickki Igou, Olegario Messier, RN Phone Number: 06/10/2020, 11:48 AM  Clinical Narrative: Sherron Monday to dtr [name] explained that initially patient was returning back to [facility], but now wants Home w/HHC-CM was not aware until now of the need for HHC,but noted in [facility]is. Will explain HHC services(intermittent)especially since dtr states she works during the day. Will offer private duty care(out of pocket cost). Continue to monitor for d/c plans.      Expected Discharge Plan: Home w Home Health Services Barriers to Discharge: Continued Medical Work up  Expected Discharge Plan and Services Expected Discharge Plan: Home w Home Health Services   Discharge Planning Services: CM Consult Post Acute Care Choice: Home Health Living arrangements for the past 2 months: Skilled Nursing Facility                                       Social Determinants of Health (SDOH) Interventions    Readmission Risk Interventions Readmission Risk Prevention Plan 02/07/2020  Post Dischage Appt Complete  Medication Screening Complete  Transportation Screening Complete  Some recent data might be hidden

## 2020-06-10 NOTE — Progress Notes (Signed)
NAME:  [name] [name], MRN:  [id number], DOB:  [birth year], LOS: 3 ADMISSION DATE:  06/06/2020, CONSULTATION DATE:  06/07/20 REFERRING MD:  Ramiro Harvest, MD CHIEF COMPLAINT:  Pleural effusion  History of Present Illness:  [name] [name] is a 70 year old woman with CHF, paroxysmal atrial fibrillation, and DMII who was recently admitted 5/2 to 5/16 for acute embolic stroke due to PAG s/p TPA therapy and mechanical thrombectomy. Her course was complicated by cardiac arrest and cardiogenic shock. She required milrinone drip and cardioversion for the atrial fibrillation. She has been eliquis for anticoagulation after the stroke. She was sent to [facility] place on 5/16 where she presented from with right sided abdominal pain, nausea and vomiting. She also complained of some shortness of breath and has been on 3L of O2 since being at [place].   Work up in the ED revealed left hemithorax opacification based on chest radiograph and CT chest showed loculated pleural effusion involving the left hemithorax.   IR was consulted for chest tube placement and a 3fr pigtail catheter was placed via CT guidance with of bloody appearing fluid removed initially.   PCCM was consulted for further evaluation and treatment of the left pleural effusion.   Pleural fluid studies show LDH 1636, total protein 3.1, glucose 134, albumin 1.5. Cell count is pending at this time.   Pertinent  Medical History  Atrial Fibrillation Heart Failure DMII  Significant Hospital Events: Including procedures, antibiotic start and stop dates in addition to other pertinent events   . 5/28 admitted to [facility], 82fr chest tube placed by IR . 5/29 1st dose of TPA/DNAse . 5/30 2nd dose of TPA/DNAse  Interim History / Subjective:   Chest tube continues to have good output, >810mL in last 24 hours.  Chest radiograph continues to show persistent loculations on left.   Patient's pain is much better controlled.  Objective   Blood  pressure (!) 134/55, pulse 61, temperature 97.8 F (36.6 C), temperature source Oral, resp. rate 20, weight 81.1 kg, SpO2 100 %.        Intake/Output Summary (Last 24 hours) at 06/10/2020 0826 Last data filed at 06/10/2020 0542 Gross per 24 hour  Intake 2080.53 ml  Output 1140 ml  Net 940.53 ml   Filed Weights   06/08/20 0500 06/09/20 0500 06/10/20 0500  Weight: 81.5 kg 81.9 kg 81.1 kg    Examination: General: Elderly woman, no acute distress HENT: [place]/AT, moist mucous membranes, sclera anicteric Lungs: Crackls throughout on left.  Clear to auscultation on the right.  Left pigtail catheter in place with bloody drainage. Cardiovascular: Regular rate and rhythm, no murmurs Abdomen: Soft, non-tender, bowel sounds present Extremities: Warm, no edema Neuro: Alert, oriented x3, moving all extremities. GU: Deferred  Labs/imaging that I have personally reviewed  (right click and "Reselect all SmartList Selections" daily)  5/27 CT Abdomen 5/28 CT Chest  CXR 5/30: improved aeration of left mid/upper lung fields. Small left pneumothorax CXR 5/31: No pneumothorax noted. Persistent partially loculated left pleural fluid  BNP 707, troponin 35, LDH 496, total protein 7.2, creatinine 0.85 CBC: WBC 11.9, hemoglobin 9.1 (Hgb on 05/13/20 was 12)  Pleural fluid: albumin 1.5, LDH 1636, total protein 3.1, cell count 67% lymph  Pleural culture - no growth to day Blood cultures - no growth to day  Resolved Hospital Problem list     Assessment & Plan:   Left complicated pleural effusion Based on review of chest imaging the pleural effusion is new since  01/2020 and first noted on chest radiograph 05/13/20.  Pleural fluid studies indicate exudative process based on LDH ratio. The etiology of the pleural effusion is most concerning for hemothorax s/p resuscitation  - f/u cytology and cultures. - TPA/DNAse therapy initiated 5/29 with improvement in chest radiograph. Will plan for a third dose today.   - Hold therapeutic anticoagulation for now while on tpa/dnase therapy. - Continue chest tube to -20 cmH2O - Cefepime and flagyl for a 7 day course, end date 6/1.  PCCM will continue to follow.   Labs   CBC: Recent Labs  Lab 06/06/20 2137 06/07/20 0823 06/08/20 0438 06/09/20 0528 06/10/20 0431  WBC 12.9* 11.9* 10.8* 10.0 10.3  NEUTROABS 10.7* 10.0* 8.5* 7.6 7.5  HGB 9.3* 9.1* 9.4* 10.6* 9.8*  HCT 30.5* 29.8* 31.3* 35.7* 32.8*  MCV 82.2 82.1 83.2 84.2 82.6  PLT 493* 481* 556* 586* 557*    Basic Metabolic Panel: Recent Labs  Lab 06/06/20 2137 06/07/20 0735 06/07/20 0823 06/08/20 0438 06/09/20 0528 06/10/20 0431  NA 138  --  137 140 137 137  K 4.7  --  4.8 4.5 5.0 5.1  CL 98  --  100 104 102 103  CO2 37*  --  33* 32 30 31  GLUCOSE 197*  --  178* 78 170* 134*  BUN 28*  --  27* 26* 27* 26*  CREATININE 0.80  --  0.85 0.74 0.76 0.78  CALCIUM 8.9  --  8.4* 8.8* 8.6* 8.4*  MG  --  2.1 1.9 2.1 1.9 1.9   GFR: Estimated Creatinine Clearance: 67.9 mL/min (by C-G formula based on SCr of 0.78 mg/dL). Recent Labs  Lab 06/07/20 0823 06/08/20 0438 06/09/20 0528 06/10/20 0431  WBC 11.9* 10.8* 10.0 10.3    Liver Function Tests: Recent Labs  Lab 06/06/20 2137 06/07/20 0823 06/08/20 0438  AST 66* 47* 39  ALT 34 32 30  ALKPHOS 168* 155* 156*  BILITOT 0.5 0.9 0.7  PROT 7.5 7.2 7.4  7.3  ALBUMIN 2.2* 2.1* 2.3*   Recent Labs  Lab 06/06/20 2137  LIPASE 35   No results for input(s): AMMONIA in the last 168 hours.  ABG    Component Value Date/Time   PHART 7.401 05/12/2020 1602   PCO2ART 37.6 05/12/2020 1602   PO2ART 349 (H) 05/12/2020 1602   HCO3 23.5 05/12/2020 1602   TCO2 25 05/12/2020 1602   ACIDBASEDEF 1.0 05/12/2020 1602   O2SAT 73.7 05/20/2020 0520     Coagulation Profile: Recent Labs  Lab 06/07/20 1533  INR 1.8*    Cardiac Enzymes: No results for input(s): CKTOTAL, CKMB, CKMBINDEX, TROPONINI in the last 168 hours.  HbA1C: HbA1c, POC  (controlled diabetic range)  Date/Time Value Ref Range Status  12/30/2017 10:02 AM 9.4 (A) 0.0 - 7.0 % Final  09/06/2017 01:31 PM 8.8 (A) 0.0 - 7.0 % Final   HbA1c POC (<> result, manual entry)  Date/Time Value Ref Range Status  07/26/2019 11:08 AM >15.0 4.0 - 5.6 % Final   Hgb A1c MFr Bld  Date/Time Value Ref Range Status  05/13/2020 05:27 AM 10.5 (H) 4.8 - 5.6 % Final    Comment:    (NOTE) Pre diabetes:          5.7%-6.4%  Diabetes:              >6.4%  Glycemic control for   <7.0% adults with diabetes   02/04/2020 06:57 AM 10.4 (H) 4.8 - 5.6 % Final  Comment:    (NOTE) Pre diabetes:          5.7%-6.4%  Diabetes:              >6.4%  Glycemic control for   <7.0% adults with diabetes     CBG: Recent Labs  Lab 06/09/20 0741 06/09/20 1209 06/09/20 1643 06/09/20 2025 06/10/20 0744  GLUCAP 156* 252* 268* 314* 158*    Critical care time: n/a    Melody Comas, MD [facility] Office: [phone]   See Amion for personal pager PCCM on call pager [phone] until 7pm. Please call Elink 7p-7a. [phone]

## 2020-06-10 NOTE — Procedures (Signed)
Pleural Fibrinolytic Administration Procedure Note  [name] [name]  [id number]  07-10-1950  Date:06/10/20  Time:11:05 AM   Provider Performing:Treston Coker W Mikey Bussing   Procedure: Pleural Fibrinolysis Subsequent day [phone])  Indication(s) Fibrinolysis of complicated pleural effusion  Consent Risks of the procedure as well as the alternatives and risks of each were explained to the patient and/or caregiver.  Consent for the procedure was obtained.   Anesthesia None   Time Out Verified patient identification, verified procedure, site/side was marked, verified correct patient position, special equipment/implants available, medications/allergies/relevant history reviewed, required imaging and test results available.   Sterile Technique Hand hygiene, gloves   Procedure Description Existing pleural catheter was cleaned and accessed in sterile manner.  10mg  of tPA in 30cc of saline and 5mg  of dornase in 30cc of sterile water were injected into pleural space using existing pleural catheter.  Catheter will be clamped for 2 hours and then placed back to suction at 1300.   Complications/Tolerance None; patient tolerated the procedure well.  EBL None   Specimen(s) None   , AGACNP-BC  Pulmonary & Critical Care  See Amion for personal pager PCCM on call pager [phone] until 7pm. Please call Elink 7p-7a. [phone]  06/10/2020 11:06 AM

## 2020-06-10 NOTE — Plan of Care (Signed)
  Problem: Clinical Measurements: °Goal: Respiratory complications will improve °Outcome: Progressing °Goal: Cardiovascular complication will be avoided °Outcome: Progressing °  °Problem: Activity: °Goal: Risk for activity intolerance will decrease °Outcome: Progressing °  °Problem: Education: °Goal: Knowledge of General Education information will improve °Description: Including pain rating scale, medication(s)/side effects and non-pharmacologic comfort measures °Outcome: Completed/Met °  °

## 2020-06-10 NOTE — Progress Notes (Signed)
Inpatient Diabetes Program Recommendations  AACE/ADA: New Consensus Statement on Inpatient Glycemic Control (2015)  Target Ranges:  Prepandial:   less than 140 mg/dL      Peak postprandial:   less than 180 mg/dL (1-2 hours)      Critically ill patients:  140 - 180 mg/dL   Lab Results  Component Value Date   GLUCAP 199 (H) 06/10/2020   HGBA1C 10.5 (H) 05/13/2020    Review of Glycemic Control Results for FREYJA, GOVEA (MRN [id number]) as of 06/10/2020 14:04  Ref. Range 06/09/2020 07:41 06/09/2020 12:09 06/09/2020 16:43 06/09/2020 20:25 06/10/2020 07:44 06/10/2020 11:18  Glucose-Capillary Latest Ref Range: 70 - 99 mg/dL 924 (H) 462 (H) 863 (H) 314 (H) 158 (H) 199 (H)   Diabetes history: DM 2 Outpatient Diabetes medications: Lantus 15 units, Novolog 0-14 units tid Current orders for Inpatient glycemic control:  Lantus 15 units Novolog 0-9 units tid + hs  A1c 10.5% on 5/3  Inpatient Diabetes Program Recommendations:    -  May consider Novolog 2 units tid meal coverage if eating >50% of meals.  Thanks,  Christena Deem RN, MSN, BC-ADM Inpatient Diabetes Coordinator Team Pager [phone] (8a-5p)

## 2020-06-11 ENCOUNTER — Inpatient Hospital Stay (HOSPITAL_COMMUNITY): Payer: Medicare Other

## 2020-06-11 DIAGNOSIS — J90 Pleural effusion, not elsewhere classified: Secondary | ICD-10-CM | POA: Diagnosis not present

## 2020-06-11 LAB — GLUCOSE, CAPILLARY
Glucose-Capillary: 139 mg/dL — ABNORMAL HIGH (ref 70–99)
Glucose-Capillary: 216 mg/dL — ABNORMAL HIGH (ref 70–99)
Glucose-Capillary: 173 mg/dL — ABNORMAL HIGH (ref 70–99)
Glucose-Capillary: 148 mg/dL — ABNORMAL HIGH (ref 70–99)

## 2020-06-11 LAB — CBC
HCT: 30.9 % — ABNORMAL LOW (ref 36.0–46.0)
Hemoglobin: 9.3 g/dL — ABNORMAL LOW (ref 12.0–15.0)
MCH: 24.7 pg — ABNORMAL LOW (ref 26.0–34.0)
MCHC: 30.1 g/dL (ref 30.0–36.0)
MCV: 82 fL (ref 80.0–100.0)
Platelets: 589 10*3/uL — ABNORMAL HIGH (ref 150–400)
RBC: 3.77 MIL/uL — ABNORMAL LOW (ref 3.87–5.11)
RDW: 18.1 % — ABNORMAL HIGH (ref 11.5–15.5)
WBC: 7.8 10*3/uL (ref 4.0–10.5)
nRBC: 0 % (ref 0.0–0.2)

## 2020-06-11 LAB — BASIC METABOLIC PANEL
Anion gap: 1 — ABNORMAL LOW (ref 5–15)
BUN: 26 mg/dL — ABNORMAL HIGH (ref 8–23)
CO2: 30 mmol/L (ref 22–32)
Calcium: 8.3 mg/dL — ABNORMAL LOW (ref 8.9–10.3)
Chloride: 105 mmol/L (ref 98–111)
Creatinine, Ser: 0.76 mg/dL (ref 0.44–1.00)
GFR, Estimated: >60 mL/min (ref >60)
Glucose, Bld: 158 mg/dL — ABNORMAL HIGH (ref 70–99)
Potassium: 5 mmol/L (ref 3.5–5.1)
Sodium: 136 mmol/L (ref 135–145)

## 2020-06-11 LAB — CYTOLOGY - NON PAP

## 2020-06-11 MED ORDER — INSULIN GLARGINE 100 UNIT/ML ~~LOC~~ SOLN
17.0000 [IU] | Freq: Every day | SUBCUTANEOUS | Status: DC
  Administered 2020-06-12 – 2020-06-18 (×6): 17 [IU] via SUBCUTANEOUS
  Filled 2020-06-11 (×7): qty 0.17

## 2020-06-11 MED ORDER — STERILE WATER FOR INJECTION IJ SOLN
5.0000 mg | Freq: Once | RESPIRATORY_TRACT | Status: AC
  Administered 2020-06-11: 5 mg via INTRAPLEURAL
  Filled 2020-06-11: qty 5

## 2020-06-11 MED ORDER — SODIUM CHLORIDE (PF) 0.9 % IJ SOLN
10.0000 mg | Freq: Once | INTRAMUSCULAR | Status: AC
  Administered 2020-06-11: 10 mg via INTRAPLEURAL
  Filled 2020-06-11: qty 10

## 2020-06-11 MED ORDER — ENOXAPARIN SODIUM 40 MG/0.4ML IJ SOSY
40.0000 mg | PREFILLED_SYRINGE | INTRAMUSCULAR | Status: DC
  Administered 2020-06-11 – 2020-06-12 (×2): 40 mg via SUBCUTANEOUS
  Filled 2020-06-11 (×2): qty 0.4

## 2020-06-11 MED ORDER — IOHEXOL 300 MG/ML  SOLN
75.0000 mL | Freq: Once | INTRAMUSCULAR | Status: AC | PRN
  Administered 2020-06-11: 75 mL via INTRAVENOUS

## 2020-06-11 NOTE — Progress Notes (Signed)
NAME:  [name] [name], MRN:  [id number], DOB:  09-Dec-1950, LOS: 4 ADMISSION DATE:  06/06/2020, CONSULTATION DATE:  06/07/20 REFERRING MD:  Ramiro Harvest, MD CHIEF COMPLAINT:  Pleural effusion  History of Present Illness:  [name] [name] is a 70 year old woman with CHF, paroxysmal atrial fibrillation, and DMII who was recently admitted 5/2 to 5/16 for acute embolic stroke due to PAG s/p TPA therapy and mechanical thrombectomy. Her course was complicated by cardiac arrest and cardiogenic shock. She required milrinone drip and cardioversion for the atrial fibrillation. She has been eliquis for anticoagulation after the stroke. She was sent to [facility] place on 5/16 where she presented from with right sided abdominal pain, nausea and vomiting. She also complained of some shortness of breath and has been on 3L of O2 since being at [place].   Work up in the ED revealed left hemithorax opacification based on chest radiograph and CT chest showed loculated pleural effusion involving the left hemithorax.   IR was consulted for chest tube placement and a 32fr pigtail catheter was placed via CT guidance with of bloody appearing fluid removed initially.   PCCM was consulted for further evaluation and treatment of the left pleural effusion.   Pleural fluid studies show LDH 1636, total protein 3.1, glucose 134, albumin 1.5. Cell count is pending at this time.   Pertinent  Medical History  Atrial Fibrillation Heart Failure DMII  Significant Hospital Events: Including procedures, antibiotic start and stop dates in addition to other pertinent events   . 5/28 admitted to [facility], 50fr chest tube placed by IR . 5/29 1st dose of TPA/DNAse . 5/30 2nd dose of TPA/DNAse . 5/31 3rd dose of TPA/DNAse  Interim History / Subjective:   Chest tube continues to have good output.  Chest radiograph continues to show moderate left pleural effusion.  Patient's pain is under good control.   Objective   Blood  pressure (!) 144/53, pulse 62, temperature (!) 97.5 F (36.4 C), resp. rate 18, height 5\' 3"  (1.6 m), weight 78 kg, SpO2 100 %.        Intake/Output Summary (Last 24 hours) at 06/11/2020 1120 Last data filed at 06/11/2020 0600 Gross per 24 hour  Intake 300 ml  Output 1100 ml  Net -800 ml   Filed Weights   06/09/20 0500 06/10/20 0500 06/11/20 0557  Weight: 81.9 kg 81.1 kg 78 kg    Examination: General: Elderly woman, no acute distress HENT: [place]/AT, moist mucous membranes, sclera anicteric Lungs: Crackls throughout on left.  Clear to auscultation on the right.  Left pigtail catheter in place with bloody drainage. Cardiovascular: Regular rate and rhythm, no murmurs Abdomen: Soft, non-tender, bowel sounds present Extremities: Warm, no edema Neuro: Alert, oriented x3, moving all extremities. GU: Deferred  Labs/imaging that I have personally reviewed  (right click and "Reselect all SmartList Selections" daily)  5/27 CT Abdomen 5/28 CT Chest  CXR 5/30: improved aeration of left mid/upper lung fields. Small left pneumothorax CXR 5/31: No pneumothorax noted. Persistent partially loculated left pleural fluid CXR 6/1: moderate left pleural effusion  BNP 707, troponin 35, LDH 496, total protein 7.2, creatinine 0.85 CBC: WBC 11.9, hemoglobin 9.1 (Hgb on 05/13/20 was 12)  Pleural fluid: albumin 1.5, LDH 1636, total protein 3.1, cell count 67% lymph  Pleural culture - no growth to day Blood cultures - no growth to day  Resolved Hospital Problem list     Assessment & Plan:   Left complicated pleural effusion Based on  review of chest imaging the pleural effusion is new since 01/2020 and first noted on chest radiograph 05/13/20.  Pleural fluid studies indicate exudative process based on LDH ratio. The etiology of the pleural effusion is most concerning for hemothorax s/p resuscitation  - f/u cytology and cultures. - TPA/DNAse therapy initiated 5/29. Will plan for a fourth dose today.  -  Repeat CT Chest this afternoon after intra-pleural lytic therapy this morning - Hold therapeutic anticoagulation for now while on tpa/dnase therapy. - Continue chest tube to -20 cmH2O - Cefepime and flagyl for a 7 day course, end date 6/1.  PCCM will continue to follow.   Labs   CBC: Recent Labs  Lab 06/06/20 2137 06/07/20 0823 06/08/20 0438 06/09/20 0528 06/10/20 0431 06/11/20 0440  WBC 12.9* 11.9* 10.8* 10.0 10.3 7.8  NEUTROABS 10.7* 10.0* 8.5* 7.6 7.5  --   HGB 9.3* 9.1* 9.4* 10.6* 9.8* 9.3*  HCT 30.5* 29.8* 31.3* 35.7* 32.8* 30.9*  MCV 82.2 82.1 83.2 84.2 82.6 82.0  PLT 493* 481* 556* 586* 557* 589*    Basic Metabolic Panel: Recent Labs  Lab 06/07/20 0735 06/07/20 0823 06/08/20 0438 06/09/20 0528 06/10/20 0431 06/11/20 0440  NA  --  137 140 137 137 136  K  --  4.8 4.5 5.0 5.1 5.0  CL  --  100 104 102 103 105  CO2  --  33* 32 30 31 30   GLUCOSE  --  178* 78 170* 134* 158*  BUN  --  27* 26* 27* 26* 26*  CREATININE  --  0.85 0.74 0.76 0.78 0.76  CALCIUM  --  8.4* 8.8* 8.6* 8.4* 8.3*  MG 2.1 1.9 2.1 1.9 1.9  --    GFR: Estimated Creatinine Clearance: 66.5 mL/min (by C-G formula based on SCr of 0.76 mg/dL). Recent Labs  Lab 06/08/20 0438 06/09/20 0528 06/10/20 0431 06/11/20 0440  WBC 10.8* 10.0 10.3 7.8    Liver Function Tests: Recent Labs  Lab 06/06/20 2137 06/07/20 0823 06/08/20 0438  AST 66* 47* 39  ALT 34 32 30  ALKPHOS 168* 155* 156*  BILITOT 0.5 0.9 0.7  PROT 7.5 7.2 7.4  7.3  ALBUMIN 2.2* 2.1* 2.3*   Recent Labs  Lab 06/06/20 2137  LIPASE 35   No results for input(s): AMMONIA in the last 168 hours.  ABG    Component Value Date/Time   PHART 7.401 05/12/2020 1602   PCO2ART 37.6 05/12/2020 1602   PO2ART 349 (H) 05/12/2020 1602   HCO3 23.5 05/12/2020 1602   TCO2 25 05/12/2020 1602   ACIDBASEDEF 1.0 05/12/2020 1602   O2SAT 73.7 05/20/2020 0520     Coagulation Profile: Recent Labs  Lab 06/07/20 1533  INR 1.8*    Cardiac  Enzymes: No results for input(s): CKTOTAL, CKMB, CKMBINDEX, TROPONINI in the last 168 hours.  HbA1C: HbA1c, POC (controlled diabetic range)  Date/Time Value Ref Range Status  12/30/2017 10:02 AM 9.4 (A) 0.0 - 7.0 % Final  09/06/2017 01:31 PM 8.8 (A) 0.0 - 7.0 % Final   HbA1c POC (<> result, manual entry)  Date/Time Value Ref Range Status  07/26/2019 11:08 AM >15.0 4.0 - 5.6 % Final   Hgb A1c MFr Bld  Date/Time Value Ref Range Status  05/13/2020 05:27 AM 10.5 (H) 4.8 - 5.6 % Final    Comment:    (NOTE) Pre diabetes:          5.7%-6.4%  Diabetes:              >  6.4%  Glycemic control for   <7.0% adults with diabetes   02/04/2020 06:57 AM 10.4 (H) 4.8 - 5.6 % Final    Comment:    (NOTE) Pre diabetes:          5.7%-6.4%  Diabetes:              >6.4%  Glycemic control for   <7.0% adults with diabetes     CBG: Recent Labs  Lab 06/10/20 0744 06/10/20 1118 06/10/20 1655 06/10/20 2014 06/11/20 0724  GLUCAP 158* 199* 224* 231* 139*    Critical care time: n/a    Melody Comas, MD [facility] Office: [phone]   See Amion for personal pager PCCM on call pager [phone] until 7pm. Please call Elink 7p-7a. [phone]

## 2020-06-11 NOTE — Progress Notes (Signed)
Referring Physician(s): Dewald,J/Thompson,D  Supervising Physician: Ruthann Cancer  Patient Status:  [facility] - In-pt  Chief Complaint: Complex left pleural effusion   Subjective: Patient states she feels about the same as yesterday; does have some mild abdominal/back discomfort as well as some soreness at chest tube insertion site; tolerating diet okay so far   Allergies: Amiodarone, Fruit & vegetable daily [nutritional supplements], Metoprolol, Other, Peanut-containing drug products, Aspirin, Tylenol [acetaminophen], Beef-derived products, and Lactose intolerance (gi)  Medications: Prior to Admission medications   Medication Sig Start Date End Date Taking? Authorizing Provider  amiodarone (PACERONE) 200 MG tablet Take 1 tablet (200 mg total) by mouth 2 (two) times daily. 05/26/20  Yes Bonnielee Haff, MD  amLODipine (NORVASC) 5 MG tablet Take 5 mg by mouth daily.   Yes [provider]  atorvastatin (LIPITOR) 80 MG tablet Take 1 tablet (80 mg total) by mouth daily. 05/27/20  Yes Bonnielee Haff, MD  bisacodyl (DULCOLAX) 10 MG suppository Place 1 suppository (10 mg total) rectally daily as needed for moderate constipation. 05/26/20  Yes Bonnielee Haff, MD  digoxin (LANOXIN) 0.125 MG tablet Take 1 tablet (0.125 mg total) by mouth daily. 05/27/20  Yes Bonnielee Haff, MD  ELIQUIS 5 MG TABS tablet TAKE 1 TABLET(5 MG) BY MOUTH TWICE DAILY Patient taking differently: Take 5 mg by mouth 2 (two) times daily. 04/10/20  Yes Tobb, Kardie, DO  feeding supplement, GLUCERNA SHAKE, (GLUCERNA SHAKE) LIQD Take 237 mLs by mouth 3 (three) times daily between meals.   Yes [provider]  ferrous gluconate (FERGON) 324 MG tablet Take 324 mg by mouth daily.   Yes [provider]  hydrALAZINE (APRESOLINE) 10 MG tablet Take 1 tablet (10 mg total) by mouth every 8 (eight) hours. 05/26/20  Yes Bonnielee Haff, MD  insulin aspart (NOVOLOG) 100 UNIT/ML injection Inject into the skin 3  (three) times daily before meals. If BGC is less than 80, call MD  201-250, 2 units  251-300, 4 units 301-350, 6 units 351-400, 8 units 401-450, 10 units 451-500, 12 units 500 or more give 14 units of NovoLog, and recheck BCG in 2 hours   Yes [provider]  insulin glargine (LANTUS) 100 UNIT/ML Solostar Pen Inject 18 Units into the skin daily. Patient taking differently: Inject 15 Units into the skin See admin instructions. Inject 20 units into skin at 3 PM daily 02/07/20  Yes Anderson, Chelsey L, DO  Isosorbide Mononitrate (IMDUR PO) Take 20 mg by mouth 2 (two) times daily.   Yes [provider]  Lidocaine (ASPERCREME LIDOCAINE) 4 % PTCH Apply 1 patch topically 2 (two) times daily. Apply to upper back   Yes [provider]  Magnesium Oxide 400 MG CAPS Take 1 capsule (400 mg total) by mouth daily. 08/17/19  Yes Weaver, Scott T, PA-C  metaxalone (SKELAXIN) 800 MG tablet Take 1 tablet by mouth in the morning, at noon, and at bedtime. 05/29/20  Yes [provider]  Multiple Vitamin (MULTIVITAMIN WITH MINERALS) TABS tablet Take 1 tablet by mouth daily. 05/27/20  Yes Bonnielee Haff, MD  oxyCODONE (OXY IR/ROXICODONE) 5 MG immediate release tablet Take 1 tablet (5 mg total) by mouth every 6 (six) hours as needed for severe pain. 05/26/20  Yes Bonnielee Haff, MD  pantoprazole (PROTONIX) 40 MG tablet Take 1 tablet (40 mg total) by mouth daily. 05/27/20  Yes Bonnielee Haff, MD  polyethylene glycol (MIRALAX / GLYCOLAX) 17 g packet Take 17 g by mouth 2 (two) times daily. 05/26/20  Yes Bonnielee Haff, MD  senna-docusate (SENOKOT-S) 8.6-50 MG tablet Take 2 tablets by mouth 2 (two) times daily. 05/26/20  Yes Bonnielee Haff, MD  acetaminophen (TYLENOL) 325 MG tablet Take 650 mg by mouth 3 (three) times daily as needed for mild pain or moderate pain.    [provider]  B-D UF III MINI PEN NEEDLES 31G X 5 MM MISC USE WITH LANTUS PEN DAILY AS DIRECTED 02/11/20   Carollee Leitz, MD  Blood Pressure KIT Monitor blood pressure twice a day 09/28/19   Carollee Leitz, MD  glucose blood (ACCU-CHEK AVIVA) test strip Use as instructed 06/16/17   Zenia Resides, MD  Lancets (ACCU-CHEK SOFT TOUCH) lancets Once daily testing. 06/16/17   Zenia Resides, MD     Vital Signs: BP (!) 144/53 (BP Location: Left Arm)   Pulse 62   Temp (!) 97.5 F (36.4 C)   Resp 18   Ht 5' 3" (1.6 m)   Wt 171 lb 15.3 oz (78 kg)   SpO2 100%   BMI 30.46 kg/m   Physical Exam patient awake, alert.  Left chest tube intact, insertion site mild to moderately tender to palpation, output 800 cc bloody fluid; no obvious air leak in Pleur-evac  Imaging: US Abdomen Complete  Result Date: 06/07/2020 CLINICAL DATA:  Abdominal and back pain EXAM: ABDOMEN ULTRASOUND COMPLETE COMPARISON:  06/06/2020 FINDINGS: Gallbladder: Surgically absent. Common bile duct: Diameter: 5 mm Liver: No focal lesion identified. Within normal limits in parenchymal echogenicity. Portal vein is patent on color Doppler imaging with normal direction of blood flow towards the liver. IVC: No abnormality visualized. Pancreas: Visualized portion unremarkable. Spleen: Normal size and appearance. Evaluation is limited due to overlying bandaging from left chest tube. The splenic hypodensity seen on CT is not visualized by ultrasound. Right Kidney: Length: 10.5 cm. Echogenicity within normal limits. No mass or hydronephrosis visualized. Left Kidney: Length: 10.9 cm. Echogenicity within normal limits. No mass or hydronephrosis visualized. Abdominal aorta: No aneurysm visualized. Other findings: None. IMPRESSION: 1. Grossly unremarkable abdominal ultrasound. 2. Limited evaluation of the spleen given overlying bandage material related to left-sided chest tube. The hypodensity seen on CT is not visualized by ultrasound. Electronically Signed   By: Randa Ngo M.D.   On: 06/07/2020 21:55   DG CHEST PORT 1 VIEW  Result Date: 06/11/2020 CLINICAL  DATA:  Pleural effusion. EXAM: PORTABLE CHEST 1 VIEW COMPARISON:  06/10/2020. FINDINGS: Left chest tube in stable position. Persistent moderate left pleural effusion. Low lung volumes with bibasilar atelectasis/infiltrates. No pneumothorax. Heart size stable. IMPRESSION: 1. Left chest tube in stable position. Persistent moderate left pleural effusion. No pneumothorax. 2.  Low lung volumes with bibasilar atelectasis/infiltrates. Electronically Signed   By: Marcello Moores  Register   On: 06/11/2020 06:59   DG CHEST PORT 1 VIEW  Result Date: 06/10/2020 CLINICAL DATA:  70 year old female with history of pleural effusion. EXAM: PORTABLE CHEST 1 VIEW COMPARISON:  Chest x-ray 06/09/2020. FINDINGS: Small bore left-sided chest tube with tip in the lower left hemithorax. Previously noted left-sided pneumothorax is not confidently identified on today's examination. There does appear to be some accumulated left-sided pleural fluid, much of which extends into the upper left hemithorax laterally. Patchy opacities are noted throughout the left lung, which may reflect residual areas of atelectasis and/or consolidation. Right lung is low volume, but appears well aerated. No right pleural effusion. No evidence of pulmonary edema. Heart size is normal. Upper mediastinal contours are within normal limits allowing for patient positioning.  IMPRESSION: 1. Left-sided chest tube is stable in position with resolution of previously noted pneumothorax but persistence of a partially loculated left pleural fluid which is at least moderate in volume. Residual areas of atelectasis and/or consolidation in the left lung. Electronically Signed   By: Vinnie Langton M.D.   On: 06/10/2020 05:05   DG CHEST PORT 1 VIEW  Result Date: 06/09/2020 CLINICAL DATA:  Follow-up left pleural effusion and chest tube. EXAM: PORTABLE CHEST 1 VIEW COMPARISON:  06/08/2020. FINDINGS: Left chest tube is stable. There is improvement in left lung aeration now with partial  clearing of the left mid to upper lung. There is also evidence of a small left pneumothorax noted along upper lateral aspect of the lung, which may be an ex vacuo pneumothorax. Mild atelectasis at the medial right lung base. Right lung is otherwise clear. No right pneumothorax or effusion. IMPRESSION: 1. Mild interval improvement. Partial interval clearing of the left lung compared to the previous day's exam. 2. New small left pneumothorax likely an ex vacuo pneumothorax. 3. No other change. Electronically Signed   By: Lajean Manes M.D.   On: 06/09/2020 08:06   DG CHEST PORT 1 VIEW  Result Date: 06/08/2020 CLINICAL DATA:  Pleural effusion EXAM: PORTABLE CHEST 1 VIEW COMPARISON:  CT chest dated 06/07/2020 FINDINGS: Near complete opacification of the left hemithorax, related to the patient's known complex pleural effusion/hemothorax. Mild lucency at the lung base with indwelling pigtail drainage catheter. Right lung is clear. The heart is partially obscured. IMPRESSION: Near complete opacification of the left hemithorax, now with mild lucency at the left lung base, with indwelling pigtail drainage catheter. Electronically Signed   By: Julian Hy M.D.   On: 06/08/2020 05:46   CT [facility] PLEURAL DRAIN W/INDWELL CATH W/IMG GUIDE  Result Date: 06/07/2020 INDICATION: Large left-sided pleural effusion, worrisome for hemothorax given history of recent CPR with subsequent initiation anticoagulation. Please perform image guided left-sided chest tube placement for therapeutic and diagnostic purposes. EXAM: CT PERC PLEURAL DRAIN W/INDWELL CATH W/IMG GUIDE COMPARISON:  Chest CT-06/07/2020 MEDICATIONS: The patient is currently admitted to the hospital and receiving intravenous antibiotics. The antibiotics were administered within an appropriate time frame prior to the initiation of the procedure. ANESTHESIA/SEDATION: Moderate (conscious) sedation was employed during this procedure. A total of Versed 2 mg and Fentanyl  100 mcg was administered intravenously. Moderate Sedation Time: 19 minutes. The patient's level of consciousness and vital signs were monitored continuously by radiology nursing throughout the procedure under my direct supervision. CONTRAST:  None COMPLICATIONS: None immediate. PROCEDURE: Informed written consent was obtained from the patient after a discussion of the risks, benefits and alternatives to treatment. The patient was placed supine, slightly RPO on the CT gantry and a pre procedural CT was performed re-demonstrating the known large likely partially loculated left-sided pleural effusion. The procedure was planned. A timeout was performed prior to the initiation of the procedure. The skin overlying the inferolateral aspect of left chest was prepped and draped in the usual sterile fashion. The overlying soft tissues were anesthetized with 1% lidocaine with epinephrine. Appropriate trajectory was planned with the use of a 22 gauge spinal needle. An 18 gauge trocar needle was advanced into the abscess/fluid collection and a short Amplatz super stiff wire was coiled within the collection. Appropriate positioning was confirmed with a limited CT scan. The tract was serially dilated allowing placement of a 14 French all-purpose drainage catheter. Appropriate positioning was confirmed with a limited postprocedural CT scan. Approximately 950  cc of bloody fluid was aspirated. The tube was connected to a pleura vac device and sutured in place. A dressing was applied. The patient tolerated the procedure well without immediate post procedural complication. IMPRESSION: Successful CT guided placement of a 35 French all purpose drain catheter into the basilar aspect the left pleural space with aspiration of 950 cc of bloody fluid. Samples were sent to the laboratory as requested by the ordering clinical team. Electronically Signed   By: Sandi Mariscal M.D.   On: 06/07/2020 15:16    Labs:  CBC: Recent Labs     06/08/20 0438 06/09/20 0528 06/10/20 0431 06/11/20 0440  WBC 10.8* 10.0 10.3 7.8  HGB 9.4* 10.6* 9.8* 9.3*  HCT 31.3* 35.7* 32.8* 30.9*  PLT 556* 586* 557* 589*    COAGS: Recent Labs    07/31/19 0255 05/12/20 1220 05/14/20 0507 05/19/20 1045 06/07/20 1533  INR 1.3* 1.3*  --  1.3* 1.8*  APTT  --  26 92*  --   --     BMP: Recent Labs    08/31/19 0948 11/09/19 1201 02/03/20 1445 03/03/20 1559 03/05/20 1524 03/18/20 1645 06/08/20 0438 06/09/20 0528 06/10/20 0431 06/11/20 0440  NA 142 145*   < > 138 139   < > 140 137 137 136  K 4.4 4.5   < > 5.1 4.8   < > 4.5 5.0 5.1 5.0  CL 102 105   < > 104 102   < > 104 102 103 105  CO2 28 27   < > 21 24   < > 32 _0 GLUCOSE 100* 139*   < > 161* 197*   < > 78 170* 134* 158*  BUN 13 13   < > 16 17   < > 26* 27* 26* 26*  CALCIUM 9.7 9.6   < > 9.1 8.9   < > 8.8* 8.6* 8.4* 8.3*  CREATININE 0.77 0.80   < > 0.96 1.08*   < > 0.74 0.76 0.78 0.76  GFRNONAA 80 77   < > 61 53*   < > >60 >60 >60 >60  GFRAA 92 88  --  70 61  --   --   --   --   --    < > = values in this interval not displayed.    LIVER FUNCTION TESTS: Recent Labs    05/23/20 0155 06/06/20 2137 06/07/20 0823 06/08/20 0438  BILITOT 1.1 0.5 0.9 0.7  AST 24 66* 47* 39  ALT 25 34 32 30  ALKPHOS 151* 168* 155* 156*  PROT 6.6 7.5 7.2 7.4  7.3  ALBUMIN 2.2* 2.2* 2.1* 2.3*    Assessment and Plan: Patient with history of complex left pleural effusion/?hemothorax, status post drain placement 5/28; afebrile, WBC normal, hemoglobin 9.3 down from 9.8, creatinine normal, pleural fluid cultures negative ; CXR today: Left chest tube in stable position. Persistent moderate left pleural effusion. No pneumothorax. Low lung volumes with bibasilar atelectasis/infiltrates  Per nurse CCM planning for additional tPA/dornase injection via tube today; further plans as per their service; consider follow-up CT before tube removal  Electronically Signed: D. Rowe Robert,  PA-C 06/11/2020, 10:09 AM   I spent a total of 15 minutes at the the patient's bedside AND on the patient's hospital floor or unit, greater than 50% of which was counseling/coordinating care for left chest drain    Patient ID: Manfred Arch, female   DOB: [birth year], 70 y.o.   MRN:  [id number]

## 2020-06-11 NOTE — Progress Notes (Signed)
PROGRESS NOTE    Gordy Councilmanatricia G Spayd   ZOX:[id number]  DOB: 1950-02-18  PCP: Dana AllanWalsh, Tanya, MD    DOA: 06/06/2020 LOS: 4   Brief Narrative   Patient 70 year old female history of CHF(EF<20%), paroxysmal A. fib on chronic anticoagulation, hypertension, hyperlipidemia, IDDM type II, bipolar disorder recently hospitalized for acute embolic stroke requiring tPA and mechanical thrombectomy with hospital course complicated by cardiogenic shock/PEA cardiac arrest immediately following thrombectomy.  During that hospitalization patient required milrinone infusion and diuretics.  Patient also required vasopressors was extubated and weaned off pressors on 05/13/2020.  Patient status post TEE/DCCV on 5/9 and [facility].  Patient presented to the ED from SNF with right-sided abdominal pain, nausea vomiting, shortness of breath with speaking.  Patient noted to have a leukocytosis with a white count of 12.9, hemoglobin of 9.3, platelet count of 493.  Patient pancultured.  Chest x-ray showed complete opacification of the left hemithorax.  CT chest concerning for large complex multiloculated left pleural effusion increased in size since prior CT of 05/12/2020 with differential concerning for empyema versus sequelae of previous hemothorax versus neoplasm.  IR consulted and pigtail catheter drain placed with 950 cc of bloody fluid aspirated.  PCCM consulted and patient with fibrinolysis 06/08/2020.  Cardiology also consulted for management of chronic complicated cardiac history.   [facility], 9514[facility] chest tube placed by IR  5/29 1st dose of TPA/DNAse  5/30 2nd dose of TPA/DNAse    Assessment & Plan   Principal Problem:   Pleural effusion Active Problems:   HTN (hypertension)   Mixed hyperlipidemia   Uncontrolled type 2 diabetes mellitus with hyperglycemia (HCC)   A-fib (HCC)   Pleural effusion on left   Right upper quadrant abdominal pain   Chest pain   QT  prolongation   Large left multiloculated exudative pleural effusion Pt presented with abdominal pain/chest pain, shortness of breath with speaking. -Exudative pleural effusion by lights criteria. -CT chest with large complex multilocular left pleural effusion increased in size since recent CT of the neck 05/12/2020. -IR consulted and pigtail drain catheter placed with 950 cc of bloody aspirate noted. -PCCM/pulmonary consulted  -status post tPA/DNase on 5/29, 30, 31, 6/1 -Continue to hold anticoagulation of Eliquis. -Continue empiric IV cefepime, IV Flagyl. -PCCM/pulmonary following and appreciate input and recommendations.  Right-sided abdominal pain/nausea and vomiting -Likely secondary to rib fractures and also possible problem #1 versus secondary to recent resuscitation and possible constipation -CT abdomen and pelvis with no acute intra-abdominal process. -Abdominal ultrasound unremarkable. -COVID-19 PCR negative, influenza A and B PCR negative. -Lipase within normal limits. -IV Toradol 15 mg every 6 hours as needed pain. -Oxycodone as needed. -Supportive care.  Chest pain -Likely secondary to problem #1. -Cardiology consulted - feel patient's chest pain syndrome is noncardiac with flat troponin trend.  Recent embolic CVA -Status post tPA and mechanical thrombectomy during prior hospitalization. -Eliquis has been discontinued secondary to bloody aspirate noted from pleural space with pigtail drain catheter. -resume eliquis when ok with PCCM (per note: Hold therapeutic anticoagulation for now while on tpa/dnase therapy)  Paroxysmal atrial fibrillation/status post TEE/ DCCV 5/9 -In NSR.   -Continue amiodarone.   -Eliquis on hold secondary to problem #1.    QT prolongation -Concerning as patient noted to be on amiodarone. -Cardiology consulted: QT prolongation is normal on amiodarone.  Nonischemic cardiomyopathy/chronic systolic heart failure -Norvasc, digoxin,  hydralazine, Imdur. -Not on spironolactone/ACE/ARB due to history of hyperkalemia. -Not on metoprolol due to allergy. -  Not on Entresto or farxiga due to orthostasis -Per cardiology.  Hypertension -Continue Norvasc.   -Hydralazine and Imdur have been uptitrated per cardiology for better blood pressure control. -Per cardiology.    Hyperlipidemia -Continue statin.    Poorly controlled insulin-dependent type 2 diabetes mellitus -Hemoglobin A1c 10.5 (05/13/2020) -Continue Lantus, SSI.  Titrate insulin for inpatient goal 140-180  Constipation -Continue MiraLAX twice daily -Placed on Senokot-S twice daily -Smog enema x1.   Patient BMI: Body mass index is 30.46 kg/m.   DVT prophylaxis: Place and maintain sequential compression device Start: 06/08/20 1841 Place and maintain sequential compression device Start: 06/07/20 1434   Diet:  Diet Orders (From admission, onward)    Start     Ordered   06/07/20 1903  Diet Heart Room service appropriate? Yes; Fluid consistency: Thin  Diet effective now       Question Answer Comment  Room service appropriate? Yes   Fluid consistency: Thin      06/07/20 1903            Code Status: Full Code    Subjective 06/11/20    Patient seen at bedside this morning.  Her nurse reported patient having some abdominal pain this morning the patient reports that have not after taking medications.  Feeling better now.  Is having back and side pain secondary to her chest tube.  She denies other acute complaints including fevers or chills, cough, chest pain shortness of breath nausea vomiting or diarrhea.   Disposition Plan & Communication   Status is: Inpatient  Remains inpatient appropriate because:Inpatient level of care appropriate due to severity of illness with chest tube in place and receiving fibrinolysis with pulmonary for complicated pleural effusion.   Dispo: The patient is from: Home              Anticipated d/c is to: Home  (SNF  recommended)              Patient currently is not medically stable to d/c.   Difficult to place patient No   Consults, Procedures, Significant Events   Consultants:   Pulmonology  Cardiology  Interventional radiology  Procedures:   CT chest 06/07/2020  CT abdomen and pelvis 06/06/2020  Chest x-ray 06/06/2020, 06/08/2020  Abdominal ultrasound 06/07/2020  CT-guided placement of 14 French all-purpose drain catheter into left pleural space with aspiration of 950 cc of bloody fluid--- Per Dr. Grace Isaac, IR 06/07/2020  Pleural fibrinolysis Per PCCM, Dr. Francine Graven 06/08/2020  Second pleural fibrinolysis pending 06/09/2020    Antimicrobials:  Anti-infectives (From admission, onward)   Start     Dose/Rate Route Frequency Ordered Stop   06/10/20 1400  metroNIDAZOLE (FLAGYL) tablet 500 mg        500 mg Oral Every 8 hours 06/10/20 1142 06/14/20 0559   06/08/20 0000  vancomycin (VANCOCIN) IVPB 1000 mg/200 mL premix  Status:  Discontinued        1,000 mg 200 mL/hr over 60 Minutes Intravenous Every 24 hours 06/07/20 0647 06/09/20 0800   06/07/20 1000  metroNIDAZOLE (FLAGYL) IVPB 500 mg  Status:  Discontinued        500 mg 100 mL/hr over 60 Minutes Intravenous Every 8 hours 06/07/20 0635 06/10/20 1142   06/07/20 1000  ceFEPIme (MAXIPIME) 2 g in sodium chloride 0.9 % 100 mL IVPB        2 g 200 mL/hr over 30 Minutes Intravenous Every 8 hours 06/07/20 0647 06/13/20 2359   06/07/20 0115  vancomycin (VANCOCIN) IVPB 1000  mg/200 mL premix        1,000 mg 200 mL/hr over 60 Minutes Intravenous  Once 06/07/20 0114 06/07/20 0512   06/07/20 0115  ceFEPIme (MAXIPIME) 1 g in sodium chloride 0.9 % 100 mL IVPB  Status:  Discontinued        1 g 200 mL/hr over 30 Minutes Intravenous  Once 06/07/20 0114 06/07/20 0114   06/07/20 0115  metroNIDAZOLE (FLAGYL) IVPB 500 mg        500 mg 100 mL/hr over 60 Minutes Intravenous  Once 06/07/20 0114 06/07/20 0512   06/07/20 0115  ceFEPIme (MAXIPIME) 2 g in sodium  chloride 0.9 % 100 mL IVPB        2 g 200 mL/hr over 30 Minutes Intravenous  Once 06/07/20 0114 06/07/20 0512        Micro    Objective   Vitals:   06/10/20 1333 06/10/20 2013 06/11/20 0545 06/11/20 0557  BP: (!) 142/51 (!) 137/57 (!) 144/53   Pulse: 73 65 62   Resp: 18 18    Temp:  97.9 F (36.6 C) (!) 97.5 F (36.4 C)   TempSrc:      SpO2: 95% 100% 100%   Weight:    78 kg  Height:    5\' 3"  (1.6 m)    Intake/Output Summary (Last 24 hours) at 06/11/2020 0813 Last data filed at 06/11/2020 0600 Gross per 24 hour  Intake 300 ml  Output 1100 ml  Net -800 ml   Filed Weights   06/09/20 0500 06/10/20 0500 06/11/20 0557  Weight: 81.9 kg 81.1 kg 78 kg    Physical Exam:  General exam: awake, alert, no acute distress HEENT: moist mucus membranes, hearing grossly normal  Respiratory system: CTAB with diminished left base, no wheezes, rales or rhonchi, normal respiratory effort, left-sided Pleurx catheter in place with no surrounding warmth swelling or erythema, clean dry intact dressing overlying. Cardiovascular system: normal S1/S2, RRR, no pedal edema.   Gastrointestinal system: soft, NT, ND, +bowel sounds. Central nervous system: A&O x3. no gross focal neurologic deficits, normal speech Extremities: moves all, no edema, normal tone Psychiatry: normal mood, congruent affect, judgement and insight appear normal  Labs   Data Reviewed: I have personally reviewed following labs and imaging studies  CBC: Recent Labs  Lab 06/06/20 2137 06/07/20 0823 06/08/20 0438 06/09/20 0528 06/10/20 0431 06/11/20 0440  WBC 12.9* 11.9* 10.8* 10.0 10.3 7.8  NEUTROABS 10.7* 10.0* 8.5* 7.6 7.5  --   HGB 9.3* 9.1* 9.4* 10.6* 9.8* 9.3*  HCT 30.5* 29.8* 31.3* 35.7* 32.8* 30.9*  MCV 82.2 82.1 83.2 84.2 82.6 82.0  PLT 493* 481* 556* 586* 557* 589*   Basic Metabolic Panel: Recent Labs  Lab 06/07/20 0735 06/07/20 0823 06/08/20 0438 06/09/20 0528 06/10/20 0431 06/11/20 0440  NA  --   137 140 137 137 136  K  --  4.8 4.5 5.0 5.1 5.0  CL  --  100 104 102 103 105  CO2  --  33* 32 30 31 30   GLUCOSE  --  178* 78 170* 134* 158*  BUN  --  27* 26* 27* 26* 26*  CREATININE  --  0.85 0.74 0.76 0.78 0.76  CALCIUM  --  8.4* 8.8* 8.6* 8.4* 8.3*  MG 2.1 1.9 2.1 1.9 1.9  --    GFR: Estimated Creatinine Clearance: 66.5 mL/min (by C-G formula based on SCr of 0.76 mg/dL). Liver Function Tests: Recent Labs  Lab 06/06/20 2137 06/07/20 1610 06/08/20 9604  AST 66* 47* 39  ALT 34 32 30  ALKPHOS 168* 155* 156*  BILITOT 0.5 0.9 0.7  PROT 7.5 7.2 7.4  7.3  ALBUMIN 2.2* 2.1* 2.3*   Recent Labs  Lab 06/06/20 2137  LIPASE 35   No results for input(s): AMMONIA in the last 168 hours. Coagulation Profile: Recent Labs  Lab 06/07/20 1533  INR 1.8*   Cardiac Enzymes: No results for input(s): CKTOTAL, CKMB, CKMBINDEX, TROPONINI in the last 168 hours. BNP (last 3 results) No results for input(s): PROBNP in the last 8760 hours. HbA1C: No results for input(s): HGBA1C in the last 72 hours. CBG: Recent Labs  Lab 06/10/20 0744 06/10/20 1118 06/10/20 1655 06/10/20 2014 06/11/20 0724  GLUCAP 158* 199* 224* 231* 139*   Lipid Profile: No results for input(s): CHOL, HDL, LDLCALC, TRIG, CHOLHDL, LDLDIRECT in the last 72 hours. Thyroid Function Tests: No results for input(s): TSH, T4TOTAL, FREET4, T3FREE, THYROIDAB in the last 72 hours. Anemia Panel: No results for input(s): VITAMINB12, FOLATE, FERRITIN, TIBC, IRON, RETICCTPCT in the last 72 hours. Sepsis Labs: No results for input(s): PROCALCITON, LATICACIDVEN in the last 168 hours.  Recent Results (from the past 240 hour(s))  Culture, blood (routine x 2)     Status: None (Preliminary result)   Collection Time: 06/07/20  2:10 AM   Specimen: BLOOD  Result Value Ref Range Status   Specimen Description   Final    BLOOD LEFT ANTECUBITAL Performed at [facility], [address] [place], [place] [address]     Special Requests   Final    BOTTLES DRAWN AEROBIC AND ANAEROBIC Blood Culture adequate volume Performed at [facility], [address], [place], [place] [address]    Culture   Final    NO GROWTH 4 DAYS Performed at [facility], [address], [place], [place] [address]    Report Status PENDING  Incomplete  Culture, blood (Routine X 2) w Reflex to ID Panel     Status: None (Preliminary result)   Collection Time: 06/07/20  2:15 AM   Specimen: BLOOD  Result Value Ref Range Status   Specimen Description   Final    BLOOD BLOOD RIGHT HAND Performed at [facility], [address], [place], [place] [address]    Special Requests   Final    BOTTLES DRAWN AEROBIC ONLY Blood Culture adequate volume Performed at [facility], [address] [place], [place] [address]    Culture   Final    NO GROWTH 4 DAYS Performed at [facility], [address], [place], [place] [address]    Report Status PENDING  Incomplete  Resp Panel by RT-PCR (Flu A&B, Covid) Nasopharyngeal Swab     Status: None   Collection Time: 06/07/20  2:26 AM   Specimen: Nasopharyngeal Swab; Nasopharyngeal(NP) swabs in vial transport medium  Result Value Ref Range Status   SARS Coronavirus 2 by RT PCR NEGATIVE NEGATIVE Final    Comment: (NOTE) SARS-CoV-2 target nucleic acids are NOT DETECTED.  The SARS-CoV-2 RNA is generally detectable in upper respiratory specimens during the acute phase of infection. The lowest concentration of SARS-CoV-2 viral copies this assay can detect is 138 copies/mL. A negative result does not preclude SARS-Cov-2 infection and should not be used as the sole basis for treatment or other patient management decisions. A negative result may occur with  improper specimen collection/handling, submission of specimen other than nasopharyngeal swab, presence of viral  mutation(s) within the areas targeted by this assay, and inadequate  number of viral copies(<138 copies/mL). A negative result must be combined with clinical observations, patient history, and epidemiological information. The expected result is Negative.  Fact Sheet for Patients:  BloggerCourse.com  Fact Sheet for Healthcare Providers:  SeriousBroker.it  This test is no t yet approved or cleared by the [place] FDA and  has been authorized for detection and/or diagnosis of SARS-CoV-2 by FDA under an Emergency Use Authorization (EUA). This EUA will remain  in effect (meaning this test can be used) for the duration of the COVID-19 declaration under Section 564(b)(1) of the Act, 21 U.S.C.section 360bbb-3(b)(1), unless the authorization is terminated  or revoked sooner.       Influenza A by PCR NEGATIVE NEGATIVE Final   Influenza B by PCR NEGATIVE NEGATIVE Final    Comment: (NOTE) The Xpert Xpress SARS-CoV-2/FLU/RSV plus assay is intended as an aid in the diagnosis of influenza from Nasopharyngeal swab specimens and should not be used as a sole basis for treatment. Nasal washings and aspirates are unacceptable for Xpert Xpress SARS-CoV-2/FLU/RSV testing.  Fact Sheet for Patients: BloggerCourse.com  Fact Sheet for Healthcare Providers: SeriousBroker.it  This test is not yet approved or cleared by the [place] FDA and has been authorized for detection and/or diagnosis of SARS-CoV-2 by FDA under an Emergency Use Authorization (EUA). This EUA will remain in effect (meaning this test can be used) for the duration of the COVID-19 declaration under Section 564(b)(1) of the Act, 21 U.S.C. section 360bbb-3(b)(1), unless the authorization is terminated or revoked.  Performed at [facility], [address], [place], [place] [address]   Culture, Urine     Status: Abnormal   Collection Time: 06/07/20  6:38 AM   Specimen:  Urine, Random  Result Value Ref Range Status   Specimen Description   Final    URINE, RANDOM Performed at [facility], [address], [place], [place] [address]    Special Requests   Final    NONE Performed at [facility], [address] [place], [place] [address]    Culture MULTIPLE SPECIES PRESENT, SUGGEST RECOLLECTION (A)  Final   Report Status 06/08/2020 FINAL  Final  Fungus Culture With Stain     Status: None (Preliminary result)   Collection Time: 06/07/20 12:44 PM   Specimen: Pleural Fluid  Result Value Ref Range Status   Fungus Stain Final report  Final    Comment: (NOTE) Performed At: [facility] [address] [place], [place] [id number] Jolene Schimke MD LK:[id number]    Fungus (Mycology) Culture PENDING  Incomplete   Fungal Source PLEURAL  Final    Comment: LEFT Performed at [facility], [address] [place], [place] [address]   Body fluid culture w Gram Stain     Status: None   Collection Time: 06/07/20 12:44 PM   Specimen: Pleura  Result Value Ref Range Status   Specimen Description   Final    PLEURAL LEFT Performed at [facility], [address] [place], [place] [address]    Special Requests   Final    NONE Performed at [facility], [address], [place], [place] [address]    Gram Stain   Final    RARE WBC PRESENT,BOTH PMN AND MONONUCLEAR NO ORGANISMS SEEN    Culture   Final    NO GROWTH 3 DAYS Performed at [facility],  [address], [place], [place] [address]    Report Status 06/10/2020 FINAL  Final  Fungus Culture Result     Status: None   Collection Time: 06/07/20 12:44 PM  Result Value Ref Range Status   Result 1 Comment  Final    Comment: (NOTE) KOH/Calcofluor preparation:  no fungus observed. Performed At: [facility] [address] [place], [place] [id number] Jolene Schimke MD XT:[id number]   MRSA PCR  Screening     Status: None   Collection Time: 06/09/20  8:12 AM   Specimen: Nasal Mucosa; Nasopharyngeal  Result Value Ref Range Status   MRSA by PCR NEGATIVE NEGATIVE Final    Comment:        The GeneXpert MRSA Assay (FDA approved for NASAL specimens only), is one component of a comprehensive MRSA colonization surveillance program. It is not intended to diagnose MRSA infection nor to guide or monitor treatment for MRSA infections. Performed at [facility], [address], [place], [place] [address]       Imaging Studies   DG CHEST PORT 1 VIEW  Result Date: 06/11/2020 CLINICAL DATA:  Pleural effusion. EXAM: PORTABLE CHEST 1 VIEW COMPARISON:  06/10/2020. FINDINGS: Left chest tube in stable position. Persistent moderate left pleural effusion. Low lung volumes with bibasilar atelectasis/infiltrates. No pneumothorax. Heart size stable. IMPRESSION: 1. Left chest tube in stable position. Persistent moderate left pleural effusion. No pneumothorax. 2.  Low lung volumes with bibasilar atelectasis/infiltrates. Electronically Signed   By: Maisie Fus  Register   On: 06/11/2020 06:59   DG CHEST PORT 1 VIEW  Result Date: 06/10/2020 CLINICAL DATA:  70 year old female with history of pleural effusion. EXAM: PORTABLE CHEST 1 VIEW COMPARISON:  Chest x-ray 06/09/2020. FINDINGS: Small bore left-sided chest tube with tip in the lower left hemithorax. Previously noted left-sided pneumothorax is not confidently identified on today's examination. There does appear to be some accumulated left-sided pleural fluid, much of which extends into the upper left hemithorax laterally. Patchy opacities are noted throughout the left lung, which may reflect residual areas of atelectasis and/or consolidation. Right lung is low volume, but appears well aerated. No right pleural effusion. No evidence of pulmonary edema. Heart size is normal. Upper mediastinal contours are within normal limits allowing for patient  positioning. IMPRESSION: 1. Left-sided chest tube is stable in position with resolution of previously noted pneumothorax but persistence of a partially loculated left pleural fluid which is at least moderate in volume. Residual areas of atelectasis and/or consolidation in the left lung. Electronically Signed   By: Trudie Reed M.D.   On: 06/10/2020 05:05     Medications   Scheduled Meds: . amiodarone  200 mg Oral BID  . atorvastatin  80 mg Oral Daily  . ferrous gluconate  324 mg Oral Daily  . insulin aspart  0-5 Units Subcutaneous QHS  . insulin aspart  0-9 Units Subcutaneous TID WC  . insulin glargine  15 Units Subcutaneous Daily  . isosorbide-hydrALAZINE  2 tablet Oral TID  . magnesium oxide  400 mg Oral Daily  . metroNIDAZOLE  500 mg Oral Q8H  . polyethylene glycol  17 g Oral BID  . scopolamine  1 patch Transdermal Q72H  . senna-docusate  1 tablet Oral BID   Continuous Infusions: . sodium chloride 250 mL (06/10/20 0306)  . ceFEPime (MAXIPIME) IV 2 g (06/11/20 0426)       LOS: 4 days    Time spent: 30 minutes    Pennie Banter, DO [facility]  06/11/2020, 8:13 AM      If 7PM-7AM, please contact night-coverage. How to contact the [facility] Attending or Consulting provider 7A - 7P or covering provider during after hours 7P -7A, for this patient?    1. Check the care team in [facility] and look for a) attending/consulting [facility] provider listed and b) the [facility] team listed 2. Log into [email or web address] and use [facility] universal password to access. If you do not have the password, please contact the hospital operator. 3. Locate the [facility] provider you are looking for under [facility] and page to a number that you can be directly reached. 4. If you still have difficulty reaching the provider, please page the [facility] (Director on Call) for the Hospitalists listed on amion for assistance.

## 2020-06-11 NOTE — Procedures (Signed)
Pleural Fibrinolytic Administration Procedure Note  AUGUSTA MIRKIN  [id number]  November 01, 1950  Date:06/11/20  Time:11:24 AM   Provider Performing:Evona Westra B Saydie Gerdts   Procedure: Pleural Fibrinolysis Subsequent day [phone])  Indication(s) Fibrinolysis of complicated pleural effusion  Consent Risks of the procedure as well as the alternatives and risks of each were explained to the patient and/or caregiver.  Consent for the procedure was obtained.   Anesthesia None   Time Out Verified patient identification, verified procedure, site/side was marked, verified correct patient position, special equipment/implants available, medications/allergies/relevant history reviewed, required imaging and test results available.   Sterile Technique Hand hygiene, gloves   Procedure Description Existing pleural catheter was cleaned and accessed in sterile manner.  10mg  of tPA in 30cc of saline and 5mg  of dornase in 30cc of sterile water were injected into pleural space using existing pleural catheter.  Catheter will be clamped for 1 hour and then placed back to suction.   Complications/Tolerance None; patient tolerated the procedure well.  EBL None   Specimen(s) None

## 2020-06-11 NOTE — Plan of Care (Signed)
  Problem: Clinical Measurements: Goal: Ability to maintain clinical measurements within normal limits will improve Outcome: Progressing Goal: Will remain free from infection Outcome: Progressing Goal: Diagnostic test results will improve Outcome: Progressing Goal: Respiratory complications will improve Outcome: Progressing Goal: Cardiovascular complication will be avoided Outcome: Progressing   Problem: Pain Managment: Goal: General experience of comfort will improve Outcome: Progressing   Problem: Skin Integrity: Goal: Risk for impaired skin integrity will decrease Outcome: Progressing   

## 2020-06-11 NOTE — [place] FL2 (Signed)
[place] MEDICAID FL2 LEVEL OF CARE SCREENING TOOL     IDENTIFICATION  Patient Name: [name] [name] Birthdate: 04/01/1950 Sex: female Admission Date (Current Location): 06/06/2020  [facility] and [place] Number:  Producer, television/film/video and Address:  [facility],  [address] [place], [place] [address]      Provider Number: [id number]  Attending Physician Name and Address:  Pennie Banter, DO  Relative Name and Phone Number:  Mariel Craft [phone]    Current Level of Care: Hospital Recommended Level of Care: Skilled Nursing Facility Prior Approval Number:    Date Approved/Denied:   PASRR Number:    Discharge Plan: SNF    Current Diagnoses: Patient Active Problem List   Diagnosis Date Noted  . Pleural effusion on left   . Right upper quadrant abdominal pain   . Chest pain   . QT prolongation   . Pleural effusion 06/07/2020  . 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 Stroke (HCC) 05/12/2020  . Acute respiratory failure with hypoxia (HCC)   . Bilateral leg edema 03/18/2020  . HFrEF (heart failure with reduced ejection fraction) (HCC) 03/18/2020  . Nonrheumatic tricuspid valve regurgitation 03/18/2020  . Gastroesophageal reflux disease 03/12/2020  . Wears glasses   . Liver mass   . Lack of adequate sleep   . Hypertension   . Full dentures   . Fatigue   . Diabetes mellitus without complication (HCC)   . SOB (shortness of breath)   . A-fib (HCC) 02/06/2020  . COVID-19 02/04/2020  . Chronic anticoagulation - on Eliquis for chronic afib 02/04/2020  . Chronic combined systolic and diastolic CHF (congestive heart failure) (HCC) 02/04/2020  . Oral thrush 02/04/2020  . Acute cystitis 02/04/2020  . Acute combined systolic and diastolic heart failure (HCC) 02/03/2020  . Morbid obesity (HCC) 02/03/2020  . Atrial fibrillation with RVR (HCC) 02/03/2020  . Dysphagia 02/03/2020  . Epigastric pain 02/03/2020  . Atrial tachycardia (HCC) 07/29/2019  . History  of esophageal stricture 07/29/2019  . Atrial fibrillation with rapid ventricular response (HCC) 07/26/2019  . Rash 12/30/2017  . Healthcare maintenance 09/07/2017  . Osteopenia after menopause 06/09/2017  . Uncontrolled type 2 diabetes mellitus with hyperglycemia (HCC) 06/08/2017  . Back pain 11/15/2014  . Pericardial effusion 05/12/2010  . HTN (hypertension) 05/12/2010  . Mixed hyperlipidemia 05/12/2010    Orientation RESPIRATION BLADDER Height & Weight     Self,Time,Situation  O2 Incontinent Weight: 78 kg Height:  5\' 3"  (160 cm)  BEHAVIORAL SYMPTOMS/MOOD NEUROLOGICAL BOWEL NUTRITION STATUS      Incontinent Diet (Soft,fuid restriction)  AMBULATORY STATUS COMMUNICATION OF NEEDS Skin   Limited Assist Verbally Surgical wounds (Chest tube site)                       Personal Care Assistance Level of Assistance  Bathing,Feeding,Dressing Bathing Assistance: Limited assistance Feeding assistance: Limited assistance Dressing Assistance: Limited assistance     Functional Limitations Info  Sight,Hearing,Speech   Hearing Info: Adequate Speech Info: Adequate    SPECIAL CARE FACTORS FREQUENCY  PT (By licensed PT),OT (By licensed OT)     PT Frequency:  (5x week) OT Frequency:  (5x week)            Contractures Contractures Info: Not present    Additional Factors Info  Code Status,Allergies,Psychotropic,Insulin Sliding Scale Code Status Info:  (Full) Allergies Info:  (Amiodarone, Fruit & Vegetable Daily (Nutritional Supplements), Metoprolol, Other, Peanut-containing Drug Products, Aspirin, Tylenol (Acetaminophen), Beef-derived Products, Lactose  Intolerance)   Insulin Sliding Scale Info:  (SSI)       Current Medications (06/11/2020):  This is the current hospital active medication list Current Facility-Administered Medications  Medication Dose Route Frequency Provider Last Rate Last Admin  . 0.9 %  sodium chloride infusion   Intravenous PRN Rodolph Bong, MD 10  mL/hr at 06/10/20 0306 250 mL at 06/10/20 0306  . alteplase (CATHFLO ACTIVASE) 10 mg in sodium chloride (PF) 0.9 % 30 mL  10 mg Intrapleural Once Martina Sinner, MD       And  . dornase alfa (PULMOZYME) 5 mg in sterile water (preservative free) 30 mL  5 mg Intrapleural Once Martina Sinner, MD      . amiodarone (PACERONE) tablet 200 mg  200 mg Oral BID John Giovanni, MD   200 mg at 06/11/20 0916  . atorvastatin (LIPITOR) tablet 80 mg  80 mg Oral Daily John Giovanni, MD   80 mg at 06/11/20 0916  . ceFEPIme (MAXIPIME) 2 g in sodium chloride 0.9 % 100 mL IVPB  2 g Intravenous Q8H Della Goo, RPH 200 mL/hr at 06/11/20 1328 2 g at 06/11/20 1328  . enoxaparin (LOVENOX) injection 40 mg  40 mg Subcutaneous Q24H Esaw Grandchild A, DO      . ferrous gluconate (FERGON) tablet 324 mg  324 mg Oral Daily John Giovanni, MD   324 mg at 06/11/20 0916  . hydrALAZINE (APRESOLINE) injection 10 mg  10 mg Intravenous Q6H PRN Rodolph Bong, MD      . HYDROmorphone (DILAUDID) injection 0.5 mg  0.5 mg Intravenous Q4H PRN Martina Sinner, MD   0.5 mg at 06/10/20 1255  . insulin aspart (novoLOG) injection 0-5 Units  0-5 Units Subcutaneous QHS John Giovanni, MD   2 Units at 06/10/20 2245  . insulin aspart (novoLOG) injection 0-9 Units  0-9 Units Subcutaneous TID WC John Giovanni, MD   3 Units at 06/11/20 1336  . insulin glargine (LANTUS) injection 15 Units  15 Units Subcutaneous Daily John Giovanni, MD   15 Units at 06/11/20 0915  . isosorbide-hydrALAZINE (BIDIL) 20-37.5 MG per tablet 2 tablet  2 tablet Oral TID Rollene Rotunda, MD   2 tablet at 06/11/20 0915  . ketorolac (TORADOL) 15 MG/ML injection 15 mg  15 mg Intravenous Q6H PRN Rodolph Bong, MD   15 mg at 06/10/20 1500  . magnesium oxide (MAG-OX) tablet 400 mg  400 mg Oral Daily John Giovanni, MD   400 mg at 06/11/20 0916  . metroNIDAZOLE (FLAGYL) tablet 500 mg  500 mg Oral Q8H Vann, Jessica U, DO   500 mg at  06/11/20 1334  . oxyCODONE (Oxy IR/ROXICODONE) immediate release tablet 10 mg  10 mg Oral Q4H PRN Rodolph Bong, MD   10 mg at 06/11/20 1053  . polyethylene glycol (MIRALAX / GLYCOLAX) packet 17 g  17 g Oral BID Rodolph Bong, MD   17 g at 06/11/20 0916  . scopolamine (TRANSDERM-SCOP) 1 MG/3DAYS 1.5 mg  1 patch Transdermal Q72H John Giovanni, MD   1.5 mg at 06/10/20 0551  . senna-docusate (Senokot-S) tablet 1 tablet  1 tablet Oral BID Rodolph Bong, MD   1 tablet at 06/11/20 7353     Discharge Medications: Please see discharge summary for a list of discharge medications.  Relevant Imaging Results:  Relevant Lab Results:   Additional Information SS#[id number] [place], [place]

## 2020-06-11 NOTE — Progress Notes (Addendum)
Progress Note  Patient Name: [name] [name] Date of Encounter: 06/11/2020  [facility] Cardiologist: Thomasene Ripple, DO   Subjective   Patient states she feels the same, still have chest wall tenderness and pain with movement. She denied worsening of SOB. She is still on [place] oxygen. She is eating little more.   Inpatient Medications    Scheduled Meds: . alteplase (TPA) for intrapleural administration  10 mg Intrapleural Once   And  . pulmozyme (DORNASE) for intrapleural administration  5 mg Intrapleural Once  . amiodarone  200 mg Oral BID  . atorvastatin  80 mg Oral Daily  . ferrous gluconate  324 mg Oral Daily  . insulin aspart  0-5 Units Subcutaneous QHS  . insulin aspart  0-9 Units Subcutaneous TID WC  . insulin glargine  15 Units Subcutaneous Daily  . isosorbide-hydrALAZINE  2 tablet Oral TID  . magnesium oxide  400 mg Oral Daily  . metroNIDAZOLE  500 mg Oral Q8H  . polyethylene glycol  17 g Oral BID  . scopolamine  1 patch Transdermal Q72H  . senna-docusate  1 tablet Oral BID   Continuous Infusions: . sodium chloride 250 mL (06/10/20 0306)  . ceFEPime (MAXIPIME) IV 2 g (06/11/20 0426)   PRN Meds: sodium chloride, hydrALAZINE, HYDROmorphone (DILAUDID) injection, ketorolac, oxyCODONE   Vital Signs    Vitals:   06/10/20 1333 06/10/20 2013 06/11/20 0545 06/11/20 0557  BP: (!) 142/51 (!) 137/57 (!) 144/53   Pulse: 73 65 62   Resp: 18 18    Temp:  97.9 F (36.6 C) (!) 97.5 F (36.4 C)   TempSrc:      SpO2: 95% 100% 100%   Weight:    78 kg  Height:    5\' 3"  (1.6 m)    Intake/Output Summary (Last 24 hours) at 06/11/2020 0931 Last data filed at 06/11/2020 0600 Gross per 24 hour  Intake 300 ml  Output 1100 ml  Net -800 ml   Last 3 Weights 06/11/2020 06/10/2020 06/09/2020  Weight (lbs) 171 lb 15.3 oz 178 lb 12.7 oz 180 lb 8.9 oz  Weight (kg) 78 kg 81.1 kg 81.9 kg      Telemetry    Sinus rhythm 60s, artifacts  - Personally Reviewed  ECG    No new tracing  today - Personally Reviewed  Physical Exam   GEN: No acute distress.   Neck: No JVD Cardiac: RRR, no murmurs, rubs, or gallops. Chest wall tenderness on palpation  Respiratory: Clear to auscultation bilaterally. On 2LNC. Left side chest tube in place, sanguineous drainage noted in collection chamber  GI: Abdomen obese soft, nontender, non-distended  MS: Trace BLE edema; No deformity. Neuro:  Nonfocal  Psych: Normal affect   Labs    High Sensitivity Troponin:   Recent Labs  Lab 05/12/20 1550 06/07/20 0735  TROPONINIHS 99* 35*      Chemistry Recent Labs  Lab 06/06/20 2137 06/07/20 0823 06/08/20 0438 06/09/20 0528 06/10/20 0431 06/11/20 0440  NA 138 137 140 137 137 136  K 4.7 4.8 4.5 5.0 5.1 5.0  CL 98 100 104 102 103 105  CO2 37* 33* 32 30 31 30   GLUCOSE 197* 178* 78 170* 134* 158*  BUN 28* 27* 26* 27* 26* 26*  CREATININE 0.80 0.85 0.74 0.76 0.78 0.76  CALCIUM 8.9 8.4* 8.8* 8.6* 8.4* 8.3*  PROT 7.5 7.2 7.4  7.3  --   --   --   ALBUMIN 2.2* 2.1* 2.3*  --   --   --  AST 66* 47* 39  --   --   --   ALT 34 32 30  --   --   --   ALKPHOS 168* 155* 156*  --   --   --   BILITOT 0.5 0.9 0.7  --   --   --   GFRNONAA >60 >60 >60 >60 >60 >60  ANIONGAP 3* 4* 4* 5 3* 1*     Hematology Recent Labs  Lab 06/09/20 0528 06/10/20 0431 06/11/20 0440  WBC 10.0 10.3 7.8  RBC 4.24 3.97 3.77*  HGB 10.6* 9.8* 9.3*  HCT 35.7* 32.8* 30.9*  MCV 84.2 82.6 82.0  MCH 25.0* 24.7* 24.7*  MCHC 29.7* 29.9* 30.1  RDW 18.7* 18.3* 18.1*  PLT 586* 557* 589*    BNP Recent Labs  Lab 06/07/20 0735  BNP 707.6*     DDimer No results for input(s): DDIMER in the last 168 hours.   Radiology    DG CHEST PORT 1 VIEW  Result Date: 06/11/2020 CLINICAL DATA:  Pleural effusion. EXAM: PORTABLE CHEST 1 VIEW COMPARISON:  06/10/2020. FINDINGS: Left chest tube in stable position. Persistent moderate left pleural effusion. Low lung volumes with bibasilar atelectasis/infiltrates. No pneumothorax.  Heart size stable. IMPRESSION: 1. Left chest tube in stable position. Persistent moderate left pleural effusion. No pneumothorax. 2.  Low lung volumes with bibasilar atelectasis/infiltrates. Electronically Signed   By: Maisie Fus  Register   On: 06/11/2020 06:59   DG CHEST PORT 1 VIEW  Result Date: 06/10/2020 CLINICAL DATA:  70 year old female with history of pleural effusion. EXAM: PORTABLE CHEST 1 VIEW COMPARISON:  Chest x-ray 06/09/2020. FINDINGS: Small bore left-sided chest tube with tip in the lower left hemithorax. Previously noted left-sided pneumothorax is not confidently identified on today's examination. There does appear to be some accumulated left-sided pleural fluid, much of which extends into the upper left hemithorax laterally. Patchy opacities are noted throughout the left lung, which may reflect residual areas of atelectasis and/or consolidation. Right lung is low volume, but appears well aerated. No right pleural effusion. No evidence of pulmonary edema. Heart size is normal. Upper mediastinal contours are within normal limits allowing for patient positioning. IMPRESSION: 1. Left-sided chest tube is stable in position with resolution of previously noted pneumothorax but persistence of a partially loculated left pleural fluid which is at least moderate in volume. Residual areas of atelectasis and/or consolidation in the left lung. Electronically Signed   By: Trudie Reed M.D.   On: 06/10/2020 05:05    Cardiac Studies   Echo from 05/12/20:  1. Left ventricular ejection fraction, by estimation, is <20%. The left  ventricle has severely decreased function. The left ventricle demonstrates  global hypokinesis. There is severe concentric left ventricular  hypertrophy. Diastolic function is indeterminant due to atrial fibrillation. No LV thrombus visualized,  however, apex incompletely visualized due to lack of definity contrast.  2. Right ventricular systolic function is severely reduced.  The right  ventricular size is mildly enlarged. There is mildly elevated pulmonary  artery systolic pressure. The estimated right ventricular systolic  pressure is 40.6 mmHg.  3. A large, circumferential pericardial effusion is present. The effusion  appears to be greatest around the posterolateral LV measuring up to 2.2cm  at end-diastole. There is no RV or RA collapse to suggest tamponade. IVC  is dilated and not collapsible but patient is on the vent with significant TR, which are likely the  primary drivers of IVC dilation.  4. The mitral valve is normal  in structure. Mild mitral valve regurgitation.  5. Tricuspid valve regurgitation is moderate to severe.  6. The aortic valve is tricuspid. There is mild calcification of the  aortic valve. There is mild thickening of the aortic valve. Aortic valve  regurgitation is trivial. Mild aortic valve sclerosis is present, with no  evidence of aortic valve stenosis.  7. Left atrial size was mildly dilated.   Comparison(s): Compared to prior TTE in 01/2020, the LVEF is now severely  reduced (<20%) and a large pericardial effusion is present (previously  small). The RV systolic function now appears severely reduced.  Patient Profile     70 y.o. female with a complex PMH of systolic and diastolic heart failure (<20%), paroxysmal A. 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 She did require TEE with DCCV 05/19/20 for atrial tachycardia. She was last discharged to SNF on 05/26/20 and returned to ER on 06/06/20 for right-sided abdominal pain, nausea, and vomiting. She was found to have large left loculated pleural effusion, started on empiric antibiotic, underwent thoracentesis by IR. Cardiology is consulted and following for CHF and chest pain.   Assessment & Plan     Complex multilocular left pleural effusion -s/p CT guided 23fr pigtail catheter placement by IR, exudative effusion based on light's criteria, suspect hemothorax from recent  resuscitation, TPA/DNAse therapy initiated 5/29 per PCCM, anticoagulation with Eliquis is currently held, please resume eliquis when medically cleared   Persistent A fib/A flutter/atrial tachycardia  - s/p TEE with DCCV on 05/19/20 for atrial tachycardia with successful conversion to NSR - continue amiodarone 200mg  BID, digoxin was discontinued this admission due to borderline elevated therapeutic level,  historically not tolerating AVN blocking agents due to bradycardia and pauses   Chronic systolic and diastolic heart failure  NICM - EF was 40-45% in 07/2019 and now <20% from 05/12/20 Echo - likely tachycardia induced CM + non-compliance with GDMT, failed to follow up for ischemic and infiltrative CM workup in the past  - clinically euvolemic, weight 180 >171 pounds, Net + 286 ml since admission - add FR <1.5L daily today , continue monitor daily weight and I&O - GDMT: digoxin discontinued as above, spironolactone discontinued due to hyperkalemia, discontinued amlodipine in the setting of reduced EF, continue amiodarone for rhythm control, up-tritiated Isosorbide-hydralazine 20-37.5mg  2 tab TID, lasix 40mg  PRN if weight >190 pounds (per previous  advanced heart failure team recoomendation), will not add ARB/ARNi as she was prone to hyperkalemia   Chest wall pain - likely from recent CPR, pain improves with opioid per patient  - recommend pain management   Pericardial effusion - noted on Echo 05/12/20 with a large, circumferential pericardial effusion - recommend repeat Echo upon discharge   HTN - BP fairly controled on above antihypertensive   HLD - on statin , LDL at goal from 05/13/20   Type 2 DM - A1C 10.5%, poorly controlled - management per primary team  Recent embolic CVA 05/12/20 - on statin, resume Eliquis as  soon as possible   Abdominal pain with N/V - management per primary team    For questions or updates, please contact [facility] Please consult [email or web address] for contact info under        Signed, 07/13/20, NP  06/11/2020, 9:31 AM    History and all data above reviewed.  Patient examined.  I agree with  the findings as above.   She is still having back pain and pain at the chest tube insertion site. The patient exam reveals COR:RRR  ,  Lungs: Decreased breath sounds mildly on the left   ,  Abd: Positive bowel sounds, no rebound no guarding, Ext No edema  .  All available labs, radiology testing, previous records reviewed. Agree with documented assessment and plan.   Acute systolic HF:  Will discuss SGLT2i with primary team.  Otherwise continue current meds.    Fayrene Fearing Alexiz Sustaita  2:20 PM  06/11/2020

## 2020-06-11 NOTE — Hospital Course (Addendum)
Patient 70 year old female history of CHF(EF<20%), paroxysmal A. fib on chronic anticoagulation, hypertension, hyperlipidemia, IDDM type II, bipolar disorder recently hospitalized for acute embolic stroke requiring tPA and mechanical thrombectomy with hospital course complicated by cardiogenic shock/PEA cardiac arrest immediately following thrombectomy.  During that hospitalization patient required milrinone infusion and diuretics.  Patient also required vasopressors was extubated and weaned off pressors on 05/13/2020.  Patient status post TEE/DCCV on 5/9 and discharged to [facility] 5/16.  Patient presented to the ED from SNF with right-sided abdominal pain, nausea vomiting, shortness of breath with speaking.  Patient noted to have a leukocytosis with a white count of 12.9, hemoglobin of 9.3, platelet count of 493.  Patient pancultured.  Chest x-ray showed complete opacification of the left hemithorax.  CT chest concerning for large complex multiloculated left pleural effusion increased in size since prior CT of 05/12/2020 with differential concerning for empyema versus sequelae of previous hemothorax versus neoplasm.  IR consulted and pigtail catheter drain placed with 950 cc of bloody fluid aspirated.  PCCM consulted and patient with fibrinolysis 06/08/2020.  Cardiology also consulted for management of chronic complicated cardiac history.  5/28 admitted to [facility], 76fr chest tube placed by IR TPA/DNAse fibrinolysis daily x 6 from 5/29-6/3 Chest tube removed 6/5

## 2020-06-12 DIAGNOSIS — J90 Pleural effusion, not elsewhere classified: Secondary | ICD-10-CM | POA: Diagnosis not present

## 2020-06-12 LAB — CULTURE, BLOOD (ROUTINE X 2)
Culture: NO GROWTH
Culture: NO GROWTH
Special Requests: ADEQUATE
Special Requests: ADEQUATE

## 2020-06-12 LAB — BASIC METABOLIC PANEL
Anion gap: 6 (ref 5–15)
Anion gap: 3 — ABNORMAL LOW (ref 5–15)
BUN: 21 mg/dL (ref 8–23)
BUN: 19 mg/dL (ref 8–23)
CO2: 23 mmol/L (ref 22–32)
CO2: 28 mmol/L (ref 22–32)
Calcium: 8.6 mg/dL — ABNORMAL LOW (ref 8.9–10.3)
Calcium: 8.4 mg/dL — ABNORMAL LOW (ref 8.9–10.3)
Chloride: 106 mmol/L (ref 98–111)
Chloride: 102 mmol/L (ref 98–111)
Creatinine, Ser: 0.8 mg/dL (ref 0.44–1.00)
Creatinine, Ser: 0.68 mg/dL (ref 0.44–1.00)
GFR, Estimated: >60 mL/min (ref >60)
GFR, Estimated: >60 mL/min (ref >60)
Glucose, Bld: 102 mg/dL — ABNORMAL HIGH (ref 70–99)
Glucose, Bld: 111 mg/dL — ABNORMAL HIGH (ref 70–99)
Potassium: 7 mmol/L (ref 3.5–5.1)
Potassium: 5.9 mmol/L — ABNORMAL HIGH (ref 3.5–5.1)
Sodium: 135 mmol/L (ref 135–145)
Sodium: 133 mmol/L — ABNORMAL LOW (ref 135–145)

## 2020-06-12 LAB — GLUCOSE, CAPILLARY
Glucose-Capillary: 113 mg/dL — ABNORMAL HIGH (ref 70–99)
Glucose-Capillary: 153 mg/dL — ABNORMAL HIGH (ref 70–99)
Glucose-Capillary: 176 mg/dL — ABNORMAL HIGH (ref 70–99)
Glucose-Capillary: 266 mg/dL — ABNORMAL HIGH (ref 70–99)

## 2020-06-12 LAB — CBC
HCT: 32.6 % — ABNORMAL LOW (ref 36.0–46.0)
Hemoglobin: 10 g/dL — ABNORMAL LOW (ref 12.0–15.0)
MCH: 24.7 pg — ABNORMAL LOW (ref 26.0–34.0)
MCHC: 30.7 g/dL (ref 30.0–36.0)
MCV: 80.5 fL (ref 80.0–100.0)
Platelets: 529 10*3/uL — ABNORMAL HIGH (ref 150–400)
RBC: 4.05 MIL/uL (ref 3.87–5.11)
RDW: 18.4 % — ABNORMAL HIGH (ref 11.5–15.5)
WBC: 7.9 10*3/uL (ref 4.0–10.5)
nRBC: 0 % (ref 0.0–0.2)

## 2020-06-12 LAB — MAGNESIUM: Magnesium: 1.7 mg/dL (ref 1.7–2.4)

## 2020-06-12 MED ORDER — SODIUM ZIRCONIUM CYCLOSILICATE 10 G PO PACK
10.0000 g | PACK | Freq: Once | ORAL | Status: AC
  Administered 2020-06-12: 10 g via ORAL
  Filled 2020-06-12: qty 1

## 2020-06-12 MED ORDER — SODIUM CHLORIDE (PF) 0.9 % IJ SOLN
10.0000 mg | Freq: Once | INTRAMUSCULAR | Status: AC
  Administered 2020-06-12: 10 mg via INTRAPLEURAL
  Filled 2020-06-12: qty 10

## 2020-06-12 MED ORDER — STERILE WATER FOR INJECTION IJ SOLN
5.0000 mg | Freq: Once | RESPIRATORY_TRACT | Status: AC
  Administered 2020-06-12: 5 mg via INTRAPLEURAL
  Filled 2020-06-12: qty 5

## 2020-06-12 NOTE — Progress Notes (Signed)
Occupational Therapy Treatment Patient Details Name: [name] [name] MRN: [id number] DOB: 09/07/1950 Today's Date: 06/12/2020    History of present illness 70 yo female admited with "large/complicated" pleural effusion, L hemithorax s/p chest tube placement. Recent last admit-(CVA, post procedure cardiac arrest, cardiogenic shock, DCCV). Hx of DM, Afib, COVID, schizophrenia, bipolar d/o, CHF   OT comments  Treatment focused on functional mobility. Min assist to transfer into sitting for trunk due to pain from chest tube. Min guard for standing and transferring to recliner with RW. Was going to attempt LB bathing from chair but patient's daughter called and derailed treatment. Patient found with purewick. Discussed with RN to keep purewick off patient and allow to get up to [facility] to improve strength and activity tolerance. Continue to recommend short term rehab.    Follow Up Recommendations  SNF    Equipment Recommendations  None recommended by OT    Recommendations for Other Services      Precautions / Restrictions Precautions Precautions: Fall Precaution Comments: chest tube; back pain; orthostatics Restrictions Weight Bearing Restrictions: No       Mobility Bed Mobility Overal bed mobility: Needs Assistance Bed Mobility: Supine to Sit     Supine to sit: Min assist     General bed mobility comments: Min assist for trunk lift off to transfer to side of bed.    Transfers Overall transfer level: Needs assistance Equipment used: Rolling walker (2 wheeled) Transfers: Sit to/from Stand Sit to Stand: Min guard Stand pivot transfers: Min guard       General transfer comment: MIn guard to stand and take steps to recliner with RW. Tactie cues required to direct patient.    Balance Overall balance assessment: Needs assistance Sitting-balance support: No upper extremity supported;Feet supported Sitting balance-Leahy Scale: Fair     Standing balance support: Bilateral  upper extremity supported;During functional activity Standing balance-Leahy Scale: Poor Standing balance comment: Reliant on BUE on RW.                           ADL either performed or assessed with clinical judgement   ADL                                               Vision Patient Visual Report: No change from baseline     Perception     Praxis      Cognition Arousal/Alertness: Awake/alert Behavior During Therapy: WFL for tasks assessed/performed Overall Cognitive Status: Within Functional Limits for tasks assessed                                          Exercises     Shoulder Instructions       General Comments      Pertinent Vitals/ Pain       Pain Assessment: Faces Faces Pain Scale: Hurts little more Pain Location: back and R side Pain Descriptors / Indicators: Aching;Sore;Grimacing Pain Intervention(s): Limited activity within patient's tolerance  Home Living                                          Prior  Functioning/Environment              Frequency           Progress Toward Goals  OT Goals(current goals can now be found in the care plan section)  Progress towards OT goals: Progressing toward goals  Acute Rehab OT Goals Patient Stated Goal: get home OT Goal Formulation: With patient Time For Goal Achievement: 06/24/20 Potential to Achieve Goals: Good  Plan Discharge plan remains appropriate;Frequency remains appropriate    Co-evaluation          OT goals addressed during session:  (functional mobility)      AM-PAC OT "6 Clicks" Daily Activity     Outcome Measure   Help from another person eating meals?: None Help from another person taking care of personal grooming?: A Little Help from another person toileting, which includes using toliet, bedpan, or urinal?: A Little Help from another person bathing (including washing, rinsing, drying)?: A Little Help from  another person to put on and taking off regular upper body clothing?: A Little Help from another person to put on and taking off regular lower body clothing?: A Lot 6 Click Score: 18    End of Session Equipment Utilized During Treatment: Rolling walker;Oxygen  OT Visit Diagnosis: Unsteadiness on feet (R26.81);Other abnormalities of gait and mobility (R26.89);Pain;Muscle weakness (generalized) (M62.81);Other symptoms and signs involving cognitive function   Activity Tolerance Patient tolerated treatment well   Patient Left in chair;with call bell/phone within reach;with chair alarm set   Nurse Communication Mobility status        Time: 8588-5027 OT Time Calculation (min): 22 min  Charges: OT General Charges $OT Visit: 1 Visit OT Treatments $Therapeutic Activity: 8-22 mins  Waldron Session, OTR/L Acute Care Rehab Services  Office [phone] Pager: [phone]    Kelli Churn 06/12/2020, 1:06 PM

## 2020-06-12 NOTE — TOC Progression Note (Signed)
Transition of Care [facility]) - Progression Note    Patient Details  Name: LASHALA LASER MRN: [id number] Date of Birth: 06/10/1950  Transition of Care [facility]) CM/SW Contact  Advika Mclelland, Olegario Messier, RN Phone Number: 06/12/2020, 10:43 AM  Clinical Narrative:   Will provide SNF offers per dtr marlena permission-she will discuss w/other sister but may want home w/HHC.  1. 1.3 mi [facility] [address] [place], [place] [address] [phone] Overall rating Above average 2. 1.6 mi [address] at [place], [place] [address] [place], [place] [address] [phone] Overall rating Much below average 3. 2.1 mi [facility] and [facility] [address] [place], [place] [address] [phone] Overall rating Much below average 4. 2.5 mi [facility], [facility] [address] [place], [place] [address] [phone] Overall rating Average 5. 2.8 mi [facility] at the [facility] [address] [place], [place] [address] [phone] Overall rating Above average 6. 2.8 mi [facility] [address] [place], [place] [address] [phone] Overall rating Average 7. 3 mi [facility] [address] [place], [place] [address] [phone] Overall rating Much above average 8. 3.6 mi [facility] [address] [place], [place] [address] [phone] Overall rating Average 9. 3.6 mi [facility] [address] [place], [place] [address] [phone] Overall rating Below average 10. 3.9 mi [facility] [address] [place], [place] [address] [phone] Overall rating Below average 11. 4.4 mi [facility] [employer] [address] [place], [place] [address] [phone] Overall rating Much above average 12. 4.6  mi [facility] [address] [place], [place] [address] [phone] Overall rating Above average 13. 5.5 mi [facility] [address] [place], [place] [address] [phone] Overall rating Above average 14. 8.2 [facility] [address] [place], [place] [address] [phone] Overall rating Above average 15. 9 mi [facility] [address] [place], [place] [address] [phone] Overall rating Average <Previous 1 2 3  All Next> Data last updated: Jun 04, 2020 To explore and download nursing home data,visit the data catalog on CMS.gov    Mapbox  OpenStreetMap Improve this map About MedicareMedicare Glossary Nondiscrimination/AccessibilityPrivacy PolicyPrivacy SettingLinking PolicyUsing this sitePlain Writing Department of Health and Human Services A federal government website managed and paid for by the U.[place]. Centers for Jun 06, 2020 and [employer]. [address] [place], [place], [place]   Expected Discharge Plan: Skilled Nursing Facility Barriers to Discharge: Continued Medical Work up  Expected Discharge Plan and Services Expected Discharge Plan: Skilled Nursing Facility   Discharge Planning Services: CM Consult Post Acute Care Choice: Home Health Living arrangements for the past 2 months: Skilled Nursing Facility                                       Social Determinants of Health (SDOH) Interventions    Readmission Risk Interventions Readmission Risk Prevention Plan 02/07/2020  Post Dischage Appt Complete  Medication Screening Complete  Transportation Screening Complete  Some recent data might be hidden

## 2020-06-12 NOTE — Progress Notes (Signed)
PHARMACY NOTE -  Cefepime  Pharmacy has been assisting with dosing of cefepime for loculated pleural effusion vs empyema vs infected hemothorax.  Dosage remains stable at 2g IV q8 hr and further renal adjustments per institutional Pharmacy antibiotic protocol  Stop date has been set for 7d (6/3)  Pharmacy will sign off, following peripherally for culture results or dose adjustments. Please reconsult if a change in clinical status warrants re-evaluation of dosage.  Bernadene Person, PharmD, BCPS [phone] 06/12/2020, 7:54 AM

## 2020-06-12 NOTE — Progress Notes (Addendum)
Progress Note  Patient Name: [name] [name] Date of Encounter: 06/12/2020  [facility] Cardiologist: Thomasene Ripple, DO   Subjective   Patient states she feels well, denied any SOB. She states she is not consuming diet high in potassium. She states she had BM yesterday with stool softener. She still has chest wall tenderness and pain worsened with movement.    Inpatient Medications    Scheduled Meds: . alteplase (TPA) for intrapleural administration  10 mg Intrapleural Once   And  . pulmozyme (DORNASE) for intrapleural administration  5 mg Intrapleural Once  . amiodarone  200 mg Oral BID  . atorvastatin  80 mg Oral Daily  . enoxaparin (LOVENOX) injection  40 mg Subcutaneous Q24H  . ferrous gluconate  324 mg Oral Daily  . insulin aspart  0-5 Units Subcutaneous QHS  . insulin aspart  0-9 Units Subcutaneous TID WC  . insulin glargine  17 Units Subcutaneous Daily  . isosorbide-hydrALAZINE  2 tablet Oral TID  . magnesium oxide  400 mg Oral Daily  . metroNIDAZOLE  500 mg Oral Q8H  . polyethylene glycol  17 g Oral BID  . scopolamine  1 patch Transdermal Q72H  . senna-docusate  1 tablet Oral BID   Continuous Infusions: . sodium chloride Stopped (06/12/20 0350)  . ceFEPime (MAXIPIME) IV 2 g (06/12/20 0312)   PRN Meds: sodium chloride, hydrALAZINE, HYDROmorphone (DILAUDID) injection, ketorolac, oxyCODONE   Vital Signs    Vitals:   06/11/20 2014 06/11/20 2228 06/12/20 0417 06/12/20 0421  BP: (!) 133/47 (!) 138/48 (!) 138/56   Pulse: 66 65 67   Resp: 18  18   Temp: 97.8 F (36.6 C)  97.6 F (36.4 C)   TempSrc: Oral  Oral   SpO2: 100%  100%   Weight:    80 kg  Height:        Intake/Output Summary (Last 24 hours) at 06/12/2020 0915 Last data filed at 06/12/2020 0513 Gross per 24 hour  Intake 461.84 ml  Output 1440 ml  Net -978.16 ml   Last 3 Weights 06/12/2020 06/11/2020 06/10/2020  Weight (lbs) 176 lb 5.9 oz 171 lb 15.3 oz 178 lb 12.7 oz  Weight (kg) 80 kg 78 kg 81.1 kg       Telemetry    Sinus rhythm/sinus bradycardia 50- 60s, artifacts  - Personally Reviewed  ECG    EKG repeated today showed sinus rhythm, 65 bpm, ST depression of inferolateral leads appears resolved Personally Reviewed  Physical Exam   GEN: No acute distress.   Neck: No JVD Cardiac: RRR, no murmurs, rubs, or gallops. Chest wall tenderness on palpation  Respiratory: Clear to auscultation bilaterally. On 2LNC. Left side chest tube in place, serosanguineous drainage noted in collection chamber  GI: Abdomen obese soft, nontender, non-distended  MS: Trace BLE edema; No deformity. SCDs in place.  Neuro:  Nonfocal  Psych: Normal affect   Labs    High Sensitivity Troponin:   Recent Labs  Lab 06/07/20 0735  TROPONINIHS 35*      Chemistry Recent Labs  Lab 06/06/20 2137 06/07/20 0823 06/08/20 0438 06/09/20 0528 06/11/20 0440 06/12/20 0457 06/12/20 0725  NA 138 137 140   < > 136 135 133*  K 4.7 4.8 4.5   < > 5.0 7.0* 5.9*  CL 98 100 104   < > 105 106 102  CO2 37* 33* 32   < > 30 23 28   GLUCOSE 197* 178* 78   < > 158* 102*  111*  BUN 28* 27* 26*   < > 26* 21 19  CREATININE 0.80 0.85 0.74   < > 0.76 0.80 0.68  CALCIUM 8.9 8.4* 8.8*   < > 8.3* 8.6* 8.4*  PROT 7.5 7.2 7.4  7.3  --   --   --   --   ALBUMIN 2.2* 2.1* 2.3*  --   --   --   --   AST 66* 47* 39  --   --   --   --   ALT 34 32 30  --   --   --   --   ALKPHOS 168* 155* 156*  --   --   --   --   BILITOT 0.5 0.9 0.7  --   --   --   --   GFRNONAA >60 >60 >60   < > >60 >60 >60  ANIONGAP 3* 4* 4*   < > 1* 6 3*   < > = values in this interval not displayed.     Hematology Recent Labs  Lab 06/10/20 0431 06/11/20 0440 06/12/20 0457  WBC 10.3 7.8 7.9  RBC 3.97 3.77* 4.05  HGB 9.8* 9.3* 10.0*  HCT 32.8* 30.9* 32.6*  MCV 82.6 82.0 80.5  MCH 24.7* 24.7* 24.7*  MCHC 29.9* 30.1 30.7  RDW 18.3* 18.1* 18.4*  PLT 557* 589* 529*    BNP Recent Labs  Lab 06/07/20 0735  BNP 707.6*     DDimer No results for  input(s): DDIMER in the last 168 hours.   Radiology    CT CHEST W CONTRAST  Result Date: 06/11/2020 CLINICAL DATA:  Abnormal chest x-ray, pleural effusion, chest tube, recent CPR and subsequent anticoagulation EXAM: CT CHEST WITH CONTRAST TECHNIQUE: Multidetector CT imaging of the chest was performed during intravenous contrast administration. Sagittal and coronal MPR images reconstructed from axial data set. CONTRAST:  84mL OMNIPAQUE IOHEXOL 300 MG/ML  SOLN IV. COMPARISON:  04/07/2020 FINDINGS: Cardiovascular: Thoracic vascular structures patent. Coronary arterial calcifications. Aorta normal caliber. Mild enlargement of cardiac chambers. Small pericardial effusion again seen. Mediastinum/Nodes: Large RIGHT thyroid mass 4.0 x 3.5 cm image 13; this was recently assessed by ultrasound 05/14/2020 and percutaneous FNA was recommended. Base of cervical region otherwise unremarkable. Scattered normal sized axillary and mediastinal lymph nodes. No definite thoracic adenopathy. Mild wall thickening of mid to distal esophagus which could reflect esophagitis. Lungs/Pleura: Scattered subsegmental atelectasis throughout RIGHT lung. Minimal RIGHT pleural effusion. No RIGHT lung infiltrate. Calcified granuloma RIGHT upper lobe anteriorly image 51. Pigtail LEFT thoracostomy tube with decreased pleural effusion versus prior exam. Small LEFT pneumothorax. Significant atelectasis LEFT lower lobe, less LEFT upper lobe. No definite lung mass identified. Upper Abdomen: Gallbladder surgically absent. Mass versus infarct or sequela of trauma at lateral margin of spleen, 2.9 x 1.4 cm image 113. Remaining visualized upper abdomen unremarkable. Musculoskeletal: Healing fracture anterior LEFT second rib. Questionable nondisplaced fracture anterior LEFT third rib. Additional fractures of lateral RIGHT second third fourth and fifth ribs. Nondisplaced anterior wall fracture mid sternum. IMPRESSION: Multiple BILATERAL rib fractures and  sternal fracture. Decreased LEFT pleural effusion post placement of a pigtail LEFT thoracostomy tube. Small LEFT pneumothorax. Scattered subsegmental atelectasis throughout RIGHT lung with minimal RIGHT pleural effusion. Mass versus infarct or sequela of trauma at lateral margin of spleen 2.9 x 1.4 cm. Mild wall thickening of mid to distal esophagus which could reflect esophagitis. Coronary arterial calcifications. Known RIGHT thyroid mass, previously assessed by ultrasound; please refer to ultrasound report of  05/14/2020. Electronically Signed   By: Ulyses Southward M.D.   On: 06/11/2020 18:31   DG CHEST PORT 1 VIEW  Result Date: 06/11/2020 CLINICAL DATA:  Pleural effusion. EXAM: PORTABLE CHEST 1 VIEW COMPARISON:  06/10/2020. FINDINGS: Left chest tube in stable position. Persistent moderate left pleural effusion. Low lung volumes with bibasilar atelectasis/infiltrates. No pneumothorax. Heart size stable. IMPRESSION: 1. Left chest tube in stable position. Persistent moderate left pleural effusion. No pneumothorax. 2.  Low lung volumes with bibasilar atelectasis/infiltrates. Electronically Signed   By: Maisie Fus  Register   On: 06/11/2020 06:59    Cardiac Studies   Echo from 05/12/20:  1. Left ventricular ejection fraction, by estimation, is <20%. The left  ventricle has severely decreased function. The left ventricle demonstrates  global hypokinesis. There is severe concentric left ventricular  hypertrophy. Diastolic function is indeterminant due to atrial fibrillation. No LV thrombus visualized,  however, apex incompletely visualized due to lack of definity contrast.  2. Right ventricular systolic function is severely reduced. The right  ventricular size is mildly enlarged. There is mildly elevated pulmonary  artery systolic pressure. The estimated right ventricular systolic  pressure is 40.6 mmHg.  3. A large, circumferential pericardial effusion is present. The effusion  appears to be greatest around  the posterolateral LV measuring up to 2.2cm  at end-diastole. There is no RV or RA collapse to suggest tamponade. IVC  is dilated and not collapsible but patient is on the vent with significant TR, which are likely the  primary drivers of IVC dilation.  4. The mitral valve is normal in structure. Mild mitral valve regurgitation.  5. Tricuspid valve regurgitation is moderate to severe.  6. The aortic valve is tricuspid. There is mild calcification of the  aortic valve. There is mild thickening of the aortic valve. Aortic valve  regurgitation is trivial. Mild aortic valve sclerosis is present, with no  evidence of aortic valve stenosis.  7. Left atrial size was mildly dilated.   Comparison(s): Compared to prior TTE in 01/2020, the LVEF is now severely  reduced (<20%) and a large pericardial effusion is present (previously  small). The RV systolic function now appears severely reduced.  Patient Profile     70 y.o. female with a complex PMH of systolic and diastolic heart failure (<20%), paroxysmal A. 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 She did require TEE with DCCV 05/19/20 for atrial tachycardia. She was last discharged to SNF on 05/26/20 and returned to ER on 06/06/20 for right-sided abdominal pain, nausea, and vomiting. She was found to have large left loculated pleural effusion, started on cefepime and received  pigtail catheter placement by IR . Cardiology is consulted and following for CHF and chest pain.   Assessment & Plan    Complex multilocular left pleural effusion Small left pneumothorax  -s/p CT guided 18fr pigtail thoracostomy tube placement by IR, exudative effusion based on light's criteria, suspect hemothorax from recent  resuscitation, TPA/DNAse therapy initiated 5/29 per PCCM, anticoagulation  with Eliquis is currently held, please resume eliquis when medically cleared  - CT repeat 06/11/20 showed decreased LEFT pleural effusion and Small LEFT pneumothorax. - managed per primary team   Persistent A fib/A flutter/atrial tachycardia, resolved   - s/p TEE with DCCV on 05/19/20 for atrial tachycardia with successful conversion to NSR, currently remains in SR - continue amiodarone  200mg  BID, digoxin was discontinued this admission due to borderline elevated therapeutic level,  historically not tolerating AVN blocking agents due to bradycardia and pauses   Chronic systolic and diastolic heart failure  NICM - EF was 40-45% in 07/2019 and now <20% from 05/12/20 Echo - likely tachycardia induced CM + non-compliance with GDMT, failed to follow up for ischemic and infiltrative CM workup in the past  - clinically euvolemic, weight 180 >171>176 pounds, Net -691 ml since admission - add FR <1.5L daily, continue monitor daily weight and I&O - GDMT: digoxin discontinued as above, spironolactone discontinued due to hyperkalemia, discontinued amlodipine in the setting of reduced EF, continue amiodarone for rhythm control, up-tritiated Isosorbide-hydralazine 20-37.5mg  2 tab TID, lasix 40mg  PRN if weight >190 pounds (per previous  advanced heart failure team recoomendation), will not add ARB/ARNi as she was prone to hyperkalemia, may add SGLT2i (Jardiance) if primary team ok   Hyperkalemia  - K 7 >5.9 this morning, no hemolysis on lab - not on hyperkalemia inducing medication  - will check Mag level while on daily Mag supplement - consider Lokelma and low K diet, defer to hospitalist for treatment  - repeat EKG today without acute hyperkalemic changes   Chest wall pain Multiple BILATERAL rib fractures and sternal fracture - CT chest 06/11/20 showed Multiple BILATERAL rib fractures and sternal fracture. 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 Known RIGHT thyroid mass - managed per primary team     For questions or updates, please contact [facility] Please consult [email or web address] for contact info under        Signed, 07/12/20, NP  06/12/2020, 9:15 AM    History and all data above reviewed.  Patient examined.  I agree with the findings as above.   She is not reporting chest pain or SOB.  Chest tubes in place COR:  Irregular   ,  Lungs: Decreased breath sounds left greater than right  ,  Abd: Positive bowel sounds, no rebound no guarding, Ext No edema   .  All available labs, radiology testing, previous records reviewed. Agree with documented assessment and plan.   Continue current meds.  I sent a note to primary team about Jardiance possibly.  Hyperkalemia is somewhat confounding.   I don't know her current acid/base status.  Could consider 24 urine to measure potassium excretion.  Will defer to primary team.   No hyperacute T waves on EKG.   [name] [name]  1:32 PM  06/12/2020

## 2020-06-12 NOTE — Progress Notes (Signed)
PROGRESS NOTE    [name] [name]   ZOX:[id number]  DOB: October 30, 1950  PCP: Dana AllanWalsh, Tanya, MD    DOA: 06/06/2020 LOS: 5   Brief Narrative   Patient 70 year old female history of CHF(EF<20%), paroxysmal A. fib on chronic anticoagulation, hypertension, hyperlipidemia, IDDM type II, bipolar disorder recently hospitalized for acute embolic stroke requiring tPA and mechanical thrombectomy with hospital course complicated by cardiogenic shock/PEA cardiac arrest immediately following thrombectomy.  During that hospitalization patient required milrinone infusion and diuretics.  Patient also required vasopressors was extubated and weaned off pressors on 05/13/2020.  Patient status post TEE/DCCV on 5/9 and [facility] 5[facility]16.  Patient presented to the ED from SNF with right-sided abdominal pain, nausea vomiting, shortness of breath with speaking.  Patient noted to have a leukocytosis with a white count of 12.9, hemoglobin of 9.3, platelet count of 493.  Patient pancultured.  Chest x-ray showed complete opacification of the left hemithorax.  CT chest concerning for large complex multiloculated left pleural effusion increased in size since prior CT of 05/12/2020 with differential concerning for empyema versus sequelae of previous hemothorax versus neoplasm.  IR consulted and pigtail catheter drain placed with 950 cc of bloody fluid aspirated.  PCCM consulted and patient with fibrinolysis 06/08/2020.  Cardiology also consulted for management of chronic complicated cardiac history.   5/28 admitted to [facility], 414fr chest tube placed by IR  TPA/DNAse fibrinolysis 5/29, 5/30, 5/31, 6/1    Assessment & Plan   Principal Problem:   Pleural effusion Active Problems:   HTN (hypertension)   Mixed hyperlipidemia   Uncontrolled type 2 diabetes mellitus with hyperglycemia (HCC)   A-fib (HCC)   Pleural effusion on left   Right upper quadrant abdominal pain   Chest pain   QT prolongation   Large left  multiloculated exudative pleural effusion Pt presented with abdominal pain/chest pain, shortness of breath with speaking. -Exudative pleural effusion by lights criteria. -CT chest with large complex multilocular left pleural effusion increased in size since recent CT of the neck 05/12/2020. -IR consulted and pigtail drain catheter placed with 950 cc of bloody aspirate noted. CT repeat 06/11/20 showed decreased LEFT pleural effusion and Small LEFT pneumothorax  -PCCM/pulmonary following -status post tPA/DNase on 5/29, 30, 31, 6/1 -Continue to hold Eliquis. -Continue empiric IV cefepime, IV Flagyl.  Right-sided abdominal pain/nausea and vomiting -Likely secondary to rib fractures and also possible problem #1 versus secondary to recent resuscitation and possible constipation -CT abdomen and pelvis with no acute intra-abdominal process. -Abdominal ultrasound unremarkable. -COVID-19 PCR negative, influenza A and B PCR negative. -Lipase within normal limits. -IV Toradol 15 mg every 6 hours as needed pain. -Oxycodone as needed. -Supportive care.  Hyperkalemia -initial a.m. reading of K7.0 today was false, repeat 5.9.  Will give Lokelma 10 g once today and recheck tomorrow.  No associated EKG changes.  Chest pain -Likely secondary to problem #1. -Cardiology consulted - feel patient's chest pain syndrome is noncardiac with flat troponin trend.  Recent embolic CVA -Status post tPA and mechanical thrombectomy during prior hospitalization. -Eliquis has been discontinued secondary to bloody aspirate noted from pleural space with pigtail drain catheter. -resume eliquis when ok with PCCM (per note: Hold therapeutic anticoagulation for now while on tpa/dnase therapy)  Paroxysmal atrial fibrillation/status post TEE/ DCCV 5/9 -In NSR.   -Continue amiodarone.   -Eliquis on hold secondary to problem #1.    QT prolongation -Concerning as patient noted to be on amiodarone. -Cardiology consulted:  QT prolongation is normal  on amiodarone.  Nonischemic cardiomyopathy/chronic systolic heart failure -Norvasc, digoxin, hydralazine, Imdur. -Not on spironolactone/ACE/ARB due to history of hyperkalemia. -Not on metoprolol due to allergy. -Not on Entresto or farxiga due to orthostasis -Per cardiology.  Hypertension -Continue Norvasc.   -Hydralazine and Imdur have been uptitrated per cardiology for better blood pressure control. -Per cardiology.    Hyperlipidemia -Continue statin.    Poorly controlled insulin-dependent type 2 diabetes mellitus -Hemoglobin A1c 10.5 (05/13/2020) -Continue Lantus, SSI.  Titrate insulin for inpatient goal 140-180  Constipation -Continue MiraLAX twice daily -Placed on Senokot-S twice daily -Smog enema x1.   Patient BMI: Body mass index is 31.24 kg/m.   DVT prophylaxis: enoxaparin (LOVENOX) injection 40 mg Start: 06/11/20 1800 Place and maintain sequential compression device Start: 06/08/20 1841 Place and maintain sequential compression device Start: 06/07/20 1434   Diet:  Diet Orders (From admission, onward)    Start     Ordered   06/11/20 1030  Diet Heart Room service appropriate? Yes; Fluid consistency: Thin; Fluid restriction: 1500 mL Fluid  Diet effective now       Question Answer Comment  Room service appropriate? Yes   Fluid consistency: Thin   Fluid restriction: 1500 mL Fluid      06/11/20 1029            Code Status: Full Code    Subjective 06/12/20    Patient seen at bedside this morning, awake sitting up in bed.  States that pain is controlled as long as she does not move at all.  She does also have pain she says if she spends too much time talking.  Denies other acute complaints including fevers or chills, chest pain, shortness of breath, nausea vomiting.   Disposition Plan & Communication   Status is: Inpatient  Remains inpatient appropriate because:Inpatient level of care appropriate due to severity of illness  with chest tube in place and receiving fibrinolysis with pulmonary for complicated pleural effusion.   Dispo: The patient is from: Home              Anticipated d/c is to: Home  (SNF recommended)              Patient currently is not medically stable to d/c.   Difficult to place patient No   Consults, Procedures, Significant Events   Consultants:   Pulmonology  Cardiology  Interventional radiology  Procedures:   CT chest 06/07/2020  CT abdomen and pelvis 06/06/2020  Chest x-ray 06/06/2020, 06/08/2020  Abdominal ultrasound 06/07/2020  CT-guided placement of 14 French all-purpose drain catheter into left pleural space with aspiration of 950 cc of bloody fluid--- Per Dr. Grace Isaac, IR 06/07/2020  Pleural fibrinolysis Per PCCM, Dr. Francine Graven 06/08/2020  Second pleural fibrinolysis pending 06/09/2020    Antimicrobials:  Anti-infectives (From admission, onward)   Start     Dose/Rate Route Frequency Ordered Stop   06/10/20 1400  metroNIDAZOLE (FLAGYL) tablet 500 mg        500 mg Oral Every 8 hours 06/10/20 1142 06/14/20 0559   06/08/20 0000  vancomycin (VANCOCIN) IVPB 1000 mg/200 mL premix  Status:  Discontinued        1,000 mg 200 mL/hr over 60 Minutes Intravenous Every 24 hours 06/07/20 0647 06/09/20 0800   06/07/20 1000  metroNIDAZOLE (FLAGYL) IVPB 500 mg  Status:  Discontinued        500 mg 100 mL/hr over 60 Minutes Intravenous Every 8 hours 06/07/20 0635 06/10/20 1142   06/07/20 1000  ceFEPIme (MAXIPIME)  2 g in sodium chloride 0.9 % 100 mL IVPB        2 g 200 mL/hr over 30 Minutes Intravenous Every 8 hours 06/07/20 0647 06/13/20 2359   06/07/20 0115  vancomycin (VANCOCIN) IVPB 1000 mg/200 mL premix        1,000 mg 200 mL/hr over 60 Minutes Intravenous  Once 06/07/20 0114 06/07/20 0512   06/07/20 0115  ceFEPIme (MAXIPIME) 1 g in sodium chloride 0.9 % 100 mL IVPB  Status:  Discontinued        1 g 200 mL/hr over 30 Minutes Intravenous  Once 06/07/20 0114 06/07/20 0114   06/07/20  0115  metroNIDAZOLE (FLAGYL) IVPB 500 mg        500 mg 100 mL/hr over 60 Minutes Intravenous  Once 06/07/20 0114 06/07/20 0512   06/07/20 0115  ceFEPIme (MAXIPIME) 2 g in sodium chloride 0.9 % 100 mL IVPB        2 g 200 mL/hr over 30 Minutes Intravenous  Once 06/07/20 0114 06/07/20 0512        Micro    Objective   Vitals:   06/11/20 2014 06/11/20 2228 06/12/20 0417 06/12/20 0421  BP: (!) 133/47 (!) 138/48 (!) 138/56   Pulse: 66 65 67   Resp: 18  18   Temp: 97.8 F (36.6 C)  97.6 F (36.4 C)   TempSrc: Oral  Oral   SpO2: 100%  100%   Weight:    80 kg  Height:        Intake/Output Summary (Last 24 hours) at 06/12/2020 1257 Last data filed at 06/12/2020 1038 Gross per 24 hour  Intake 821.84 ml  Output 1440 ml  Net -618.16 ml   Filed Weights   06/10/20 0500 06/11/20 0557 06/12/20 0421  Weight: 81.1 kg 78 kg 80 kg    Physical Exam:  General exam: awake, alert, no acute distress Respiratory system: CTAB with diminished left base, normal respiratory effort, left-sided Pleurx catheter in place with serosanguineous fluid in chamber Cardiovascular system: Regular rate and rhythm, no peripheral edema Central nervous system: A&O x3. no gross focal neurologic deficits, normal speech Psychiatry: normal mood, congruent affect, judgement and insight appear normal  Labs   Data Reviewed: I have personally reviewed following labs and imaging studies  CBC: Recent Labs  Lab 06/06/20 2137 06/07/20 0823 06/08/20 0438 06/09/20 0528 06/10/20 0431 06/11/20 0440 06/12/20 0457  WBC 12.9* 11.9* 10.8* 10.0 10.3 7.8 7.9  NEUTROABS 10.7* 10.0* 8.5* 7.6 7.5  --   --   HGB 9.3* 9.1* 9.4* 10.6* 9.8* 9.3* 10.0*  HCT 30.5* 29.8* 31.3* 35.7* 32.8* 30.9* 32.6*  MCV 82.2 82.1 83.2 84.2 82.6 82.0 80.5  PLT 493* 481* 556* 586* 557* 589* 529*   Basic Metabolic Panel: Recent Labs  Lab 06/07/20 0823 06/08/20 0438 06/09/20 0528 06/10/20 0431 06/11/20 0440 06/12/20 0457 06/12/20 0725  06/12/20 1029  NA 137 140 137 137 136 135 133*  --   K 4.8 4.5 5.0 5.1 5.0 7.0* 5.9*  --   CL 100 104 102 103 105 106 102  --   CO2 33* 32 30 31 30 23 28   --   GLUCOSE 178* 78 170* 134* 158* 102* 111*  --   BUN 27* 26* 27* 26* 26* 21 19  --   CREATININE 0.85 0.74 0.76 0.78 0.76 0.80 0.68  --   CALCIUM 8.4* 8.8* 8.6* 8.4* 8.3* 8.6* 8.4*  --   MG 1.9 2.1 1.9 1.9  --   --   --  1.7   GFR: Estimated Creatinine Clearance: 67.4 mL/min (by C-G formula based on SCr of 0.68 mg/dL). Liver Function Tests: Recent Labs  Lab 06/06/20 2137 06/07/20 0823 06/08/20 0438  AST 66* 47* 39  ALT 34 32 30  ALKPHOS 168* 155* 156*  BILITOT 0.5 0.9 0.7  PROT 7.5 7.2 7.4  7.3  ALBUMIN 2.2* 2.1* 2.3*   Recent Labs  Lab 06/06/20 2137  LIPASE 35   No results for input(s): AMMONIA in the last 168 hours. Coagulation Profile: Recent Labs  Lab 06/07/20 1533  INR 1.8*   Cardiac Enzymes: No results for input(s): CKTOTAL, CKMB, CKMBINDEX, TROPONINI in the last 168 hours. BNP (last 3 results) No results for input(s): PROBNP in the last 8760 hours. HbA1C: No results for input(s): HGBA1C in the last 72 hours. CBG: Recent Labs  Lab 06/11/20 1153 06/11/20 1701 06/11/20 2016 06/12/20 0746 06/12/20 1128  GLUCAP 216* 173* 148* 113* 153*   Lipid Profile: No results for input(s): CHOL, HDL, LDLCALC, TRIG, CHOLHDL, LDLDIRECT in the last 72 hours. Thyroid Function Tests: No results for input(s): TSH, T4TOTAL, FREET4, T3FREE, THYROIDAB in the last 72 hours. Anemia Panel: No results for input(s): VITAMINB12, FOLATE, FERRITIN, TIBC, IRON, RETICCTPCT in the last 72 hours. Sepsis Labs: No results for input(s): PROCALCITON, LATICACIDVEN in the last 168 hours.  Recent Results (from the past 240 hour(s))  Culture, blood (routine x 2)     Status: None   Collection Time: 06/07/20  2:10 AM   Specimen: BLOOD  Result Value Ref Range Status   Specimen Description   Final    BLOOD LEFT ANTECUBITAL Performed  at [facility], [address] [place], [place] [address]    Special Requests   Final    BOTTLES DRAWN AEROBIC AND ANAEROBIC Blood Culture adequate volume Performed at [facility], [address] [place], [place] [address]    Culture   Final    NO GROWTH 5 DAYS Performed at [facility], [address], [place], [place] [address]    Report Status 06/12/2020 FINAL  Final  Culture, blood (Routine X 2) w Reflex to ID Panel     Status: None   Collection Time: 06/07/20  2:15 AM   Specimen: BLOOD  Result Value Ref Range Status   Specimen Description   Final    BLOOD BLOOD RIGHT HAND Performed at [facility], [address], [place], [place] [address]    Special Requests   Final    BOTTLES DRAWN AEROBIC ONLY Blood Culture adequate volume Performed at [facility], [address], [place], [place] [address]    Culture   Final    NO GROWTH 5 DAYS Performed at [facility], [address], [place], [place] [address]    Report Status 06/12/2020 FINAL  Final  Resp Panel by RT-PCR (Flu A&B, Covid) Nasopharyngeal Swab     Status: None   Collection Time: 06/07/20  2:26 AM   Specimen: Nasopharyngeal Swab; Nasopharyngeal(NP) swabs in vial transport medium  Result Value Ref Range Status   SARS Coronavirus 2 by RT PCR NEGATIVE NEGATIVE Final    Comment: (NOTE) SARS-CoV-2 target nucleic acids are NOT DETECTED.  The SARS-CoV-2 RNA is generally detectable in upper respiratory specimens during the acute phase of infection. The lowest concentration of SARS-CoV-2 viral copies this assay can detect is 138 copies/mL. A negative result does not preclude SARS-Cov-2 infection and should not be used as  the sole basis for treatment or other patient management decisions. A negative result may occur with  improper specimen collection/handling, submission of specimen other than nasopharyngeal swab, presence of viral  mutation(s) within the areas targeted by this assay, and inadequate number of viral copies(<138 copies/mL). A negative result must be combined with clinical observations, patient history, and epidemiological information. The expected result is Negative.  Fact Sheet for Patients:  BloggerCourse.com  Fact Sheet for Healthcare Providers:  SeriousBroker.it  This test is no t yet approved or cleared by the [place] FDA and  has been authorized for detection and/or diagnosis of SARS-CoV-2 by FDA under an Emergency Use Authorization (EUA). This EUA will remain  in effect (meaning this test can be used) for the duration of the COVID-19 declaration under Section 564(b)(1) of the Act, 21 U.S.C.section 360bbb-3(b)(1), unless the authorization is terminated  or revoked sooner.       Influenza A by PCR NEGATIVE NEGATIVE Final   Influenza B by PCR NEGATIVE NEGATIVE Final    Comment: (NOTE) The Xpert Xpress SARS-CoV-2/FLU/RSV plus assay is intended as an aid in the diagnosis of influenza from Nasopharyngeal swab specimens and should not be used as a sole basis for treatment. Nasal washings and aspirates are unacceptable for Xpert Xpress SARS-CoV-2/FLU/RSV testing.  Fact Sheet for Patients: BloggerCourse.com  Fact Sheet for Healthcare Providers: SeriousBroker.it  This test is not yet approved or cleared by the [place] FDA and has been authorized for detection and/or diagnosis of SARS-CoV-2 by FDA under an Emergency Use Authorization (EUA). This EUA will remain in effect (meaning this test can be used) for the duration of the COVID-19 declaration under Section 564(b)(1) of the Act, 21 U.S.C. section 360bbb-3(b)(1), unless the authorization is terminated or revoked.  Performed at [facility], [address], [place], [place] [address]   Culture, Urine      Status: Abnormal   Collection Time: 06/07/20  6:38 AM   Specimen: Urine, Random  Result Value Ref Range Status   Specimen Description   Final    URINE, RANDOM Performed at [facility], [address], [place], [place] [address]    Special Requests   Final    NONE Performed at [facility], [address], [place], [place] [address]    Culture MULTIPLE SPECIES PRESENT, SUGGEST RECOLLECTION (A)  Final   Report Status 06/08/2020 FINAL  Final  Fungus Culture With Stain     Status: None (Preliminary result)   Collection Time: 06/07/20 12:44 PM   Specimen: Pleural Fluid  Result Value Ref Range Status   Fungus Stain Final report  Final    Comment: (NOTE) Performed At: [facility] [address] [place], [place] [id number] Jolene Schimke MD OZ:[id number]    Fungus (Mycology) Culture PENDING  Incomplete   Fungal Source PLEURAL  Final    Comment: LEFT Performed at [facility], [address], [place], [place] [address]   Body fluid culture w Gram Stain     Status: None   Collection Time: 06/07/20 12:44 PM   Specimen: Pleura  Result Value Ref Range Status   Specimen Description   Final    PLEURAL LEFT Performed at [facility], [address] [place], [place] [address]    Special Requests   Final    NONE Performed at [facility], [address], [place], [place] [address]    Gram Stain   Final  RARE WBC PRESENT,BOTH PMN AND MONONUCLEAR NO ORGANISMS SEEN    Culture   Final    NO GROWTH 3 DAYS Performed at [facility], [address] [place], [place] [address]    Report Status 06/10/2020 FINAL  Final  Fungus Culture Result     Status: None   Collection Time: 06/07/20 12:44 PM  Result Value Ref Range Status   Result 1 Comment  Final    Comment: (NOTE) KOH/Calcofluor preparation:  no fungus observed. Performed At: [facility] [address]  [place], [place] [id number] Jolene Schimke MD VQ:[id number]   MRSA PCR Screening     Status: None   Collection Time: 06/09/20  8:12 AM   Specimen: Nasal Mucosa; Nasopharyngeal  Result Value Ref Range Status   MRSA by PCR NEGATIVE NEGATIVE Final    Comment:        The GeneXpert MRSA Assay (FDA approved for NASAL specimens only), is one component of a comprehensive MRSA colonization surveillance program. It is not intended to diagnose MRSA infection nor to guide or monitor treatment for MRSA infections. Performed at [facility], [address], [place], [place] [address]       Imaging Studies   CT CHEST W CONTRAST  Addendum Date: 06/12/2020   ADDENDUM REPORT: 06/12/2020 11:33 ADDENDUM: Discussed with Dr. Francine Graven. Some pleural thickening is identified in the LEFT hemithorax, particularly at the anterior lung base. No discrete pleural mass. Electronically Signed   By: Ulyses Southward M.D.   On: 06/12/2020 11:33   Result Date: 06/12/2020 CLINICAL DATA:  Abnormal chest x-ray, pleural effusion, chest tube, recent CPR and subsequent anticoagulation EXAM: CT CHEST WITH CONTRAST TECHNIQUE: Multidetector CT imaging of the chest was performed during intravenous contrast administration. Sagittal and coronal MPR images reconstructed from axial data set. CONTRAST:  74mL OMNIPAQUE IOHEXOL 300 MG/ML  SOLN IV. COMPARISON:  04/07/2020 FINDINGS: Cardiovascular: Thoracic vascular structures patent. Coronary arterial calcifications. Aorta normal caliber. Mild enlargement of cardiac chambers. Small pericardial effusion again seen. Mediastinum/Nodes: Large RIGHT thyroid mass 4.0 x 3.5 cm image 13; this was recently assessed by ultrasound 05/14/2020 and percutaneous FNA was recommended. Base of cervical region otherwise unremarkable. Scattered normal sized axillary and mediastinal lymph nodes. No definite thoracic adenopathy. Mild wall thickening of mid to distal esophagus which could reflect  esophagitis. Lungs/Pleura: Scattered subsegmental atelectasis throughout RIGHT lung. Minimal RIGHT pleural effusion. No RIGHT lung infiltrate. Calcified granuloma RIGHT upper lobe anteriorly image 51. Pigtail LEFT thoracostomy tube with decreased pleural effusion versus prior exam. Small LEFT pneumothorax. Significant atelectasis LEFT lower lobe, less LEFT upper lobe. No definite lung mass identified. Upper Abdomen: Gallbladder surgically absent. Mass versus infarct or sequela of trauma at lateral margin of spleen, 2.9 x 1.4 cm image 113. Remaining visualized upper abdomen unremarkable. Musculoskeletal: Healing fracture anterior LEFT second rib. Questionable nondisplaced fracture anterior LEFT third rib. Additional fractures of lateral RIGHT second third fourth and fifth ribs. Nondisplaced anterior wall fracture mid sternum. IMPRESSION: Multiple BILATERAL rib fractures and sternal fracture. Decreased LEFT pleural effusion post placement of a pigtail LEFT thoracostomy tube. Small LEFT pneumothorax. Scattered subsegmental atelectasis throughout RIGHT lung with minimal RIGHT pleural effusion. Mass versus infarct or sequela of trauma at lateral margin of spleen 2.9 x 1.4 cm. Mild wall thickening of mid to distal esophagus which could reflect esophagitis. Coronary arterial calcifications. Known RIGHT thyroid mass, previously assessed by ultrasound; please refer to ultrasound report of 05/14/2020. Electronically Signed: By: Ulyses Southward M.D. On: 06/11/2020  18:31   DG CHEST PORT 1 VIEW  Result Date: 06/11/2020 CLINICAL DATA:  Pleural effusion. EXAM: PORTABLE CHEST 1 VIEW COMPARISON:  06/10/2020. FINDINGS: Left chest tube in stable position. Persistent moderate left pleural effusion. Low lung volumes with bibasilar atelectasis/infiltrates. No pneumothorax. Heart size stable. IMPRESSION: 1. Left chest tube in stable position. Persistent moderate left pleural effusion. No pneumothorax. 2.  Low lung volumes with bibasilar  atelectasis/infiltrates. Electronically Signed   By: Maisie Fus  Register   On: 06/11/2020 06:59     Medications   Scheduled Meds: . amiodarone  200 mg Oral BID  . atorvastatin  80 mg Oral Daily  . enoxaparin (LOVENOX) injection  40 mg Subcutaneous Q24H  . ferrous gluconate  324 mg Oral Daily  . insulin aspart  0-5 Units Subcutaneous QHS  . insulin aspart  0-9 Units Subcutaneous TID WC  . insulin glargine  17 Units Subcutaneous Daily  . isosorbide-hydrALAZINE  2 tablet Oral TID  . magnesium oxide  400 mg Oral Daily  . metroNIDAZOLE  500 mg Oral Q8H  . polyethylene glycol  17 g Oral BID  . scopolamine  1 patch Transdermal Q72H  . senna-docusate  1 tablet Oral BID   Continuous Infusions: . sodium chloride Stopped (06/12/20 0350)  . ceFEPime (MAXIPIME) IV 2 g (06/12/20 0312)       LOS: 5 days    Time spent: 25 minutes greater than 50% spent at bedside in coordination of care.    Pennie Banter, DO [facility]  06/12/2020, 12:57 PM      If 7PM-7AM, please contact night-coverage. How to contact the [facility] Attending or Consulting provider 7A - 7P or covering provider during after hours 7P -7A, for this patient?    1. Check the care team in [facility] and look for a) attending/consulting TR[facility] provider listed and b) the [facility] team listed 2. Log into [email or web address] and use [facility]'s universal password to access. If you do not have the password, please contact the hospital operator. 3. Locate the [facility] provider you are looking for under [facility] and page to a number that you can be directly reached. 4. If you still have difficulty reaching the provider, please page the [facility] (Director on Call) for the Hospitalists listed on amion for assistance.

## 2020-06-12 NOTE — Procedures (Signed)
Pleural Fibrinolytic Administration Procedure Note  [name] [name]  [id number]  1950-05-23  Date:06/12/20  Time:1:57 PM   Provider Performing:Arshi Duarte B Breaker Springer   Procedure: Pleural Fibrinolysis Subsequent day [phone])  Indication(s) Fibrinolysis of complicated pleural effusion  Consent Risks of the procedure as well as the alternatives and risks of each were explained to the patient and/or caregiver.  Consent for the procedure was obtained.   Anesthesia None   Time Out Verified patient identification, verified procedure, site/side was marked, verified correct patient position, special equipment/implants available, medications/allergies/relevant history reviewed, required imaging and test results available.   Sterile Technique Hand hygiene, gloves   Procedure Description Existing pleural catheter was cleaned and accessed in sterile manner.  10mg  of tPA in 30cc of saline and 5mg  of dornase in 30cc of sterile water were injected into pleural space using existing pleural catheter.  Catheter will be clamped for 1 hour and then placed back to suction.   Complications/Tolerance None; patient tolerated the procedure well.  EBL None   Specimen(s) None

## 2020-06-12 NOTE — Progress Notes (Signed)
   06/12/20 0542  Provider Notification  Provider Name/Title Florentina Jenny, MD  Date Provider Notified 06/12/20  Time Provider Notified [phone]  Notification Type Page  Notification Reason Critical result  Test performed and critical result Potassium 7.0  Date Critical Result Received 06/12/20  Time Critical Result Received 0535  Provider response See new orders  Date of Provider Response 06/12/20  Time of Provider Response [phone]

## 2020-06-12 NOTE — Progress Notes (Signed)
NAME:  [name] [name], MRN:  [id number], DOB:  14-Jun-1950, LOS: 5 ADMISSION DATE:  06/06/2020, CONSULTATION DATE:  06/07/20 REFERRING MD:  Ramiro Harvest, MD CHIEF COMPLAINT:  Pleural effusion  History of Present Illness:  [name] [name] is a 70 year old woman with CHF, paroxysmal atrial fibrillation, and DMII who was recently admitted 5/2 to 5/16 for acute embolic stroke due to PAG s/p TPA therapy and mechanical thrombectomy. Her course was complicated by cardiac arrest and cardiogenic shock. She required milrinone drip and cardioversion for the atrial fibrillation. She has been eliquis for anticoagulation after the stroke. She was sent to [facility] on 5/16 where she presented from with right sided abdominal pain, nausea and vomiting. She also complained of some shortness of breath and has been on 3L of O2 since being at [place].   Work up in the ED revealed left hemithorax opacification based on chest radiograph and CT chest showed loculated pleural effusion involving the left hemithorax.   IR was consulted for chest tube placement and a 74fr pigtail catheter was placed via CT guidance with of bloody appearing fluid removed initially.   PCCM was consulted for further evaluation and treatment of the left pleural effusion.   Pleural fluid studies show LDH 1636, total protein 3.1, glucose 134, albumin 1.5. Cell count is pending at this time.   Pertinent  Medical History  Atrial Fibrillation Heart Failure DMII  Significant Hospital Events: Including procedures, antibiotic start and stop dates in addition to other pertinent events   . 5/28 admitted to [facility], 20fr chest tube placed by IR . 5/29 1st dose of TPA/DNAse . 5/30 2nd dose of TPA/DNAse . 5/31 3rd dose of TPA/DNAse . 6/1 4 th dose of TPA/DNAse  Interim History / Subjective:   Chest tube continues to have good output.  CT Chest scan performed yesterday, reviewed with radiology. Some areas of pleural thickening and small  remaining pleural effusion. Otherwise no loculations noted.   Patient's pain is under good control.   Objective   Blood pressure (!) 143/51, pulse 70, temperature 98 F (36.7 C), temperature source Oral, resp. rate 16, height 5\' 3"  (1.6 m), weight 80 kg, SpO2 100 %.        Intake/Output Summary (Last 24 hours) at 06/12/2020 1348 Last data filed at 06/12/2020 1038 Gross per 24 hour  Intake 821.84 ml  Output 1440 ml  Net -618.16 ml   Filed Weights   06/10/20 0500 06/11/20 0557 06/12/20 0421  Weight: 81.1 kg 78 kg 80 kg    Examination: General: Elderly woman, no acute distress HENT: [place]/AT, moist mucous membranes, sclera anicteric Lungs: clear to auscultation on right. Improved aeration on left.  Left pigtail catheter in place with bloody drainage. Cardiovascular: Regular rate and rhythm, no murmurs Abdomen: Soft, non-tender, bowel sounds present Extremities: Warm, no edema Neuro: Alert, oriented x3, moving all extremities. GU: Deferred  Labs/imaging that I have personally reviewed  (right click and "Reselect all SmartList Selections" daily)  CT Chest w/ contrast 6/1 - small left pleural effusion, thickening of visceral pleura at the anterior basal part of the left lung.   5/27 CT Abdomen 5/28 CT Chest  CXR 5/30: improved aeration of left mid/upper lung fields. Small left pneumothorax CXR 5/31: No pneumothorax noted. Persistent partially loculated left pleural fluid CXR 6/1: moderate left pleural effusion  BNP 707, troponin 35, LDH 496, total protein 7.2, creatinine 0.85 CBC: WBC 11.9, hemoglobin 9.1 (Hgb on 05/13/20 was 12)  Pleural fluid:  albumin 1.5, LDH 1636, total protein 3.1, cell count 67% lymph  Pleural culture - no growth to day Blood cultures - no growth to day  Resolved Hospital Problem list     Assessment & Plan:   Left complicated pleural effusion Based on review of chest imaging the pleural effusion is new since 01/2020 and first noted on chest radiograph  05/13/20.  Pleural fluid studies indicate exudative process based on LDH ratio. The etiology of the pleural effusion is most concerning for hemothorax s/p resuscitation  - f/u cytology and cultures. - TPA/DNAse therapy initiated 5/29. Will plan to complete a total of 6 treatments. Tomorrow will be the last instillation. - Hold therapeutic anticoagulation for now while on tpa/dnase therapy. - Continue chest tube to -20 cmH2O - Antibiotics completed 6/1 - Will discuss the case with CT surgery to determine if there is any surgical intervention for the pleural thickening of the left lung   PCCM will continue to follow.   Labs   CBC: Recent Labs  Lab 06/06/20 2137 06/07/20 0823 06/08/20 0438 06/09/20 0528 06/10/20 0431 06/11/20 0440 06/12/20 0457  WBC 12.9* 11.9* 10.8* 10.0 10.3 7.8 7.9  NEUTROABS 10.7* 10.0* 8.5* 7.6 7.5  --   --   HGB 9.3* 9.1* 9.4* 10.6* 9.8* 9.3* 10.0*  HCT 30.5* 29.8* 31.3* 35.7* 32.8* 30.9* 32.6*  MCV 82.2 82.1 83.2 84.2 82.6 82.0 80.5  PLT 493* 481* 556* 586* 557* 589* 529*    Basic Metabolic Panel: Recent Labs  Lab 06/07/20 0823 06/08/20 0438 06/09/20 0528 06/10/20 0431 06/11/20 0440 06/12/20 0457 06/12/20 0725 06/12/20 1029  NA 137 140 137 137 136 135 133*  --   K 4.8 4.5 5.0 5.1 5.0 7.0* 5.9*  --   CL 100 104 102 103 105 106 102  --   CO2 33* 32 30 31 30 23 28   --   GLUCOSE 178* 78 170* 134* 158* 102* 111*  --   BUN 27* 26* 27* 26* 26* 21 19  --   CREATININE 0.85 0.74 0.76 0.78 0.76 0.80 0.68  --   CALCIUM 8.4* 8.8* 8.6* 8.4* 8.3* 8.6* 8.4*  --   MG 1.9 2.1 1.9 1.9  --   --   --  1.7   GFR: Estimated Creatinine Clearance: 67.4 mL/min (by C-G formula based on SCr of 0.68 mg/dL). Recent Labs  Lab 06/09/20 0528 06/10/20 0431 06/11/20 0440 06/12/20 0457  WBC 10.0 10.3 7.8 7.9    Liver Function Tests: Recent Labs  Lab 06/06/20 2137 06/07/20 0823 06/08/20 0438  AST 66* 47* 39  ALT 34 32 30  ALKPHOS 168* 155* 156*  BILITOT 0.5 0.9 0.7   PROT 7.5 7.2 7.4  7.3  ALBUMIN 2.2* 2.1* 2.3*   Recent Labs  Lab 06/06/20 2137  LIPASE 35   No results for input(s): AMMONIA in the last 168 hours.  ABG    Component Value Date/Time   PHART 7.401 05/12/2020 1602   PCO2ART 37.6 05/12/2020 1602   PO2ART 349 (H) 05/12/2020 1602   HCO3 23.5 05/12/2020 1602   TCO2 25 05/12/2020 1602   ACIDBASEDEF 1.0 05/12/2020 1602   O2SAT 73.7 05/20/2020 0520     Coagulation Profile: Recent Labs  Lab 06/07/20 1533  INR 1.8*    Cardiac Enzymes: No results for input(s): CKTOTAL, CKMB, CKMBINDEX, TROPONINI in the last 168 hours.  HbA1C: HbA1c, POC (controlled diabetic range)  Date/Time Value Ref Range Status  12/30/2017 10:02 AM 9.4 (A) 0.0 - 7.0 %  Final  09/06/2017 01:31 PM 8.8 (A) 0.0 - 7.0 % Final   HbA1c POC (<> result, manual entry)  Date/Time Value Ref Range Status  07/26/2019 11:08 AM >15.0 4.0 - 5.6 % Final   Hgb A1c MFr Bld  Date/Time Value Ref Range Status  05/13/2020 05:27 AM 10.5 (H) 4.8 - 5.6 % Final    Comment:    (NOTE) Pre diabetes:          5.7%-6.4%  Diabetes:              >6.4%  Glycemic control for   <7.0% adults with diabetes   02/04/2020 06:57 AM 10.4 (H) 4.8 - 5.6 % Final    Comment:    (NOTE) Pre diabetes:          5.7%-6.4%  Diabetes:              >6.4%  Glycemic control for   <7.0% adults with diabetes     CBG: Recent Labs  Lab 06/11/20 1153 06/11/20 1701 06/11/20 2016 06/12/20 0746 06/12/20 1128  GLUCAP 216* 173* 148* 113* 153*    Critical care time: n/a    Melody Comas, MD [facility] Office: [phone]   See Amion for personal pager PCCM on call pager [phone] until 7pm. Please call Elink 7p-7a. [phone]

## 2020-06-13 ENCOUNTER — Inpatient Hospital Stay (HOSPITAL_COMMUNITY): Payer: Medicare Other

## 2020-06-13 DIAGNOSIS — J90 Pleural effusion, not elsewhere classified: Secondary | ICD-10-CM | POA: Diagnosis not present

## 2020-06-13 LAB — POTASSIUM: Potassium: 5.7 mmol/L — ABNORMAL HIGH (ref 3.5–5.1)

## 2020-06-13 LAB — GLUCOSE, CAPILLARY
Glucose-Capillary: 120 mg/dL — ABNORMAL HIGH (ref 70–99)
Glucose-Capillary: 128 mg/dL — ABNORMAL HIGH (ref 70–99)
Glucose-Capillary: 113 mg/dL — ABNORMAL HIGH (ref 70–99)
Glucose-Capillary: 81 mg/dL (ref 70–99)

## 2020-06-13 LAB — CBC
HCT: 28.8 % — ABNORMAL LOW (ref 36.0–46.0)
Hemoglobin: 9 g/dL — ABNORMAL LOW (ref 12.0–15.0)
MCH: 24.9 pg — ABNORMAL LOW (ref 26.0–34.0)
MCHC: 31.3 g/dL (ref 30.0–36.0)
MCV: 79.6 fL — ABNORMAL LOW (ref 80.0–100.0)
Platelets: 579 10*3/uL — ABNORMAL HIGH (ref 150–400)
RBC: 3.62 MIL/uL — ABNORMAL LOW (ref 3.87–5.11)
RDW: 18.2 % — ABNORMAL HIGH (ref 11.5–15.5)
WBC: 9.2 10*3/uL (ref 4.0–10.5)
nRBC: 0 % (ref 0.0–0.2)

## 2020-06-13 LAB — BASIC METABOLIC PANEL
Anion gap: 6 (ref 5–15)
BUN: 20 mg/dL (ref 8–23)
CO2: 26 mmol/L (ref 22–32)
Calcium: 8.7 mg/dL — ABNORMAL LOW (ref 8.9–10.3)
Chloride: 102 mmol/L (ref 98–111)
Creatinine, Ser: 0.79 mg/dL (ref 0.44–1.00)
GFR, Estimated: >60 mL/min (ref >60)
Glucose, Bld: 128 mg/dL — ABNORMAL HIGH (ref 70–99)
Potassium: 5.9 mmol/L — ABNORMAL HIGH (ref 3.5–5.1)
Sodium: 134 mmol/L — ABNORMAL LOW (ref 135–145)

## 2020-06-13 LAB — CHOLESTEROL, BODY FLUID: Cholesterol, Fluid: 56 mg/dL

## 2020-06-13 LAB — CK: Total CK: 255 U/L — ABNORMAL HIGH (ref 38–234)

## 2020-06-13 LAB — LACTATE DEHYDROGENASE: LDH: 300 U/L — ABNORMAL HIGH (ref 98–192)

## 2020-06-13 MED ORDER — AMIODARONE HCL 200 MG PO TABS
200.0000 mg | ORAL_TABLET | Freq: Every day | ORAL | Status: DC
  Administered 2020-06-14 – 2020-06-21 (×8): 200 mg via ORAL
  Filled 2020-06-13 (×8): qty 1

## 2020-06-13 MED ORDER — STERILE WATER FOR INJECTION IJ SOLN
5.0000 mg | Freq: Once | RESPIRATORY_TRACT | Status: AC
  Administered 2020-06-13: 5 mg via INTRAPLEURAL
  Filled 2020-06-13: qty 5

## 2020-06-13 MED ORDER — APIXABAN 5 MG PO TABS
5.0000 mg | ORAL_TABLET | Freq: Two times a day (BID) | ORAL | Status: DC
  Administered 2020-06-13 – 2020-06-21 (×16): 5 mg via ORAL
  Filled 2020-06-13 (×16): qty 1

## 2020-06-13 MED ORDER — SODIUM ZIRCONIUM CYCLOSILICATE 10 G PO PACK
10.0000 g | PACK | Freq: Every day | ORAL | Status: DC
  Administered 2020-06-13 – 2020-06-15 (×3): 10 g via ORAL
  Filled 2020-06-13 (×3): qty 1

## 2020-06-13 MED ORDER — SODIUM CHLORIDE (PF) 0.9 % IJ SOLN
10.0000 mg | Freq: Once | INTRAMUSCULAR | Status: AC
  Administered 2020-06-13: 10 mg via INTRAPLEURAL
  Filled 2020-06-13 (×2): qty 10

## 2020-06-13 NOTE — Consult Note (Signed)
   [facility] CM Inpatient Consult   06/13/2020  [name] [name] 1950-04-16 [id number]   Patient chart has been reviewed for unplanned readmission less than 30 days.  Patient assessed for community [facility] Care Management [facility] CM)follow up needs.  Per review, current recomme ndation is for SNF. No [facility] CM needs at this time.  Of note, [facility] Care Management services does not replace or interfere with any services that are arranged by inpatient case management or social work.  Christophe Louis, MSN, RN [facility] Liaison Nurse Mobile Phone [phone]  Toll free office [phone]

## 2020-06-13 NOTE — Progress Notes (Addendum)
PCCM Update:  I spoke with cardiothoracic surgery and we reviewed the most recent CT chest scan and they do not recommend further surgical intervention at this time.  We will continue the chest tube to suction and monitor daily output.  Once the daily output is less than 100 mL we will then consider removal of the chest tube.  Melody Comas, MD [place] [facility] Office: [phone]   See Amion for personal pager PCCM on call pager [phone] until 7pm. Please call Elink 7p-7a. [phone]

## 2020-06-13 NOTE — Progress Notes (Signed)
Physical Therapy Treatment Patient Details Name: [name] [name] MRN: [id number] DOB: [birth year] Today's Date: 06/13/2020    History of Present Illness 70 yo female admited with "large/complicated" pleural effusion, L hemithorax s/p chest tube placement. Recent last admit-(CVA, post procedure cardiac arrest, cardiogenic shock, DCCV). Hx of DM, Afib, COVID, schizophrenia, bipolar d/o, CHF    PT Comments    Pt incontinent of stool. Dependent with peri-care. C/o soreness from CT but getting better per pt; difficult to get adequate reading/waveform for O2 sats with mobility. SpO2=90% after transfer to chair. Pt min assist overall but is progressing slowly. Continue to recommend SNF   Follow Up Recommendations  SNF     Equipment Recommendations  Rolling walker with 5" wheels;3in1 (PT)    Recommendations for Other Services       Precautions / Restrictions Precautions Precautions: Fall Precaution Comments: chest tube; back pain Restrictions Weight Bearing Restrictions: No    Mobility  Bed Mobility   Bed Mobility: Supine to Sit     Supine to sit: Min assist     General bed mobility comments: Min assist for trunk lift off to transfer to side of bed, limited effort requiring repeated verbal cues for participation    Transfers Overall transfer level: Needs assistance Equipment used: Rolling walker (2 wheeled) Transfers: Sit to/from Stand Sit to Stand: Min guard;Min assist Stand pivot transfers: Min assist       General transfer comment: sit<>stand repeated x2 for peri-care.  pt incontinent of stool in bed. verbal cues for hand placement and to power up with LEs, assist to rise and transition to RW.  Ambulation/Gait             General Gait Details: steps forward and back to chair with min assist for balance, VCs for RW position   Stairs             Wheelchair Mobility    Modified Rankin (Stroke Patients Only)       Balance     Sitting  balance-Leahy Scale: Fair       Standing balance-Leahy Scale: Poor Standing balance comment: Reliant on BUE on RW.                            Cognition Arousal/Alertness: Awake/alert Behavior During Therapy: WFL for tasks assessed/performed                           Following Commands: Follows one step commands with increased time       General Comments: pt alert, oriented however with delayed processing and requiring incr time to follow commands. turning her cell phone over and over in her hands repeatedly, when asked what she was doing responded with non-sensical word. after sitting EOB pt repeated "I poop" over and over      Exercises      General Comments        Pertinent Vitals/Pain Pain Assessment: Faces Faces Pain Scale: Hurts little more Pain Location: back and R side Pain Descriptors / Indicators: Aching;Sore;Grimacing Pain Intervention(s): Limited activity within patient's tolerance;Monitored during session;Repositioned    Home Living                      Prior Function            PT Goals (current goals can now be found in the care plan section) Acute Rehab PT Goals  Patient Stated Goal: get home PT Goal Formulation: With patient Time For Goal Achievement: 06/23/20 Potential to Achieve Goals: Good Progress towards PT goals: Progressing toward goals    Frequency    Min 2X/week      PT Plan Current plan remains appropriate    Co-evaluation              AM-PAC PT "6 Clicks" Mobility   Outcome Measure  Help needed turning from your back to your side while in a flat bed without using bedrails?: A Lot Help needed moving from lying on your back to sitting on the side of a flat bed without using bedrails?: A Lot Help needed moving to and from a bed to a chair (including a wheelchair)?: A Little Help needed standing up from a chair using your arms (e.g., wheelchair or bedside chair)?: A Little Help needed to walk  in hospital room?: A Lot Help needed climbing 3-5 steps with a railing? : Total 6 Click Score: 13    End of Session Equipment Utilized During Treatment: Oxygen;Gait belt Activity Tolerance: Patient limited by fatigue Patient left: in chair;with call bell/phone within reach;with chair alarm set Nurse Communication: Mobility status;Other (comment) (incontinent of stool) PT Visit Diagnosis: Pain;Muscle weakness (generalized) (M62.81);Difficulty in walking, not elsewhere classified (R26.2);History of falling (Z91.81)     Time: 4709-6283 PT Time Calculation (min) (ACUTE ONLY): 20 min  Charges:  $Therapeutic Activity: 8-22 mins                     Delice Bison, PT  Acute Rehab Dept [facility]) [phone] Pager [phone]  06/13/2020    [facility] 06/13/2020, 12:55 PM

## 2020-06-13 NOTE — Progress Notes (Signed)
NAME:  [name] [name], MRN:  [id number], DOB:  03-19-1950, LOS: 6 ADMISSION DATE:  06/06/2020, CONSULTATION DATE:  06/07/20 REFERRING MD:  Ramiro Harvest, MD CHIEF COMPLAINT:  Pleural effusion  History of Present Illness:  [name] [name] is a 70 year old woman with CHF, paroxysmal atrial fibrillation, and DMII who was recently admitted 5/2 to 5/16 for acute embolic stroke due to PAG s/p TPA therapy and mechanical thrombectomy. Her course was complicated by cardiac arrest and cardiogenic shock. She required milrinone drip and cardioversion for the atrial fibrillation. She has been eliquis for anticoagulation after the stroke. She was sent to [facility] place on 5/16 where she presented from with right sided abdominal pain, nausea and vomiting. She also complained of some shortness of breath and has been on 3L of O2 since being at [place].   Work up in the ED revealed left hemithorax opacification based on chest radiograph and CT chest showed loculated pleural effusion involving the left hemithorax.   IR was consulted for chest tube placement and a 28fr pigtail catheter was placed via CT guidance with of bloody appearing fluid removed initially.   PCCM was consulted for further evaluation and treatment of the left pleural effusion.   Pleural fluid studies show LDH 1636, total protein 3.1, glucose 134, albumin 1.5. Cell count is pending at this time.   Pertinent  Medical History  Atrial Fibrillation Heart Failure DMII  Significant Hospital Events: Including procedures, antibiotic start and stop dates in addition to other pertinent events   . 5/28 admitted to [facility], 58fr chest tube placed by IR . 5/29 1st dose of TPA/DNAse . 5/30 2nd dose of TPA/DNAse . 5/31 3rd dose of TPA/DNAse . 6/1 4th dose of TPA/DNAse . 6/2 5th dose of TPA/DNAse  Interim History / Subjective:   Increased pain overnight, more comfortable this morning. Will finish TPA/DNAse therapy today.  Objective   Blood  pressure (!) 139/50, pulse 75, temperature 98.5 F (36.9 C), temperature source Oral, resp. rate (!) 22, height 5\' 3"  (1.6 m), weight 78 kg, SpO2 99 %.        Intake/Output Summary (Last 24 hours) at 06/13/2020 0842 Last data filed at 06/13/2020 0513 Gross per 24 hour  Intake 600 ml  Output 910 ml  Net -310 ml   Filed Weights   06/11/20 0557 06/12/20 0421 06/13/20 0430  Weight: 78 kg 80 kg 78 kg    Examination: General: Elderly woman, no acute distress HENT: [place]/AT, moist mucous membranes, sclera anicteric Lungs: clear to auscultation on right. Improved aeration on left with crackles present.  Left pigtail catheter in place with bloody drainage. Cardiovascular: Regular rate and rhythm, no murmurs Abdomen: Soft, non-tender, bowel sounds present Extremities: Warm, no edema Neuro: Alert, oriented x3, moving all extremities. GU: Deferred  Labs/imaging that I have personally reviewed  (right click and "Reselect all SmartList Selections" daily)  CT Chest w/ contrast 6/1 - small left pleural effusion, thickening of visceral pleura at the anterior basal part of the left lung.   5/27 CT Abdomen 5/28 CT Chest  CXR 5/30: improved aeration of left mid/upper lung fields. Small left pneumothorax CXR 5/31: No pneumothorax noted. Persistent partially loculated left pleural fluid CXR 6/1: moderate left pleural effusion CXR 6/3: left lower lobe opacities and pleural thickening  BNP 707, troponin 35, LDH 496, total protein 7.2, creatinine 0.85 CBC: WBC 11.9, hemoglobin 9.1 (Hgb on 05/13/20 was 12)  Pleural fluid: albumin 1.5, LDH 1636, total protein 3.1, cell count  67% lymph  Pleural culture - no growth to day Blood cultures - no growth to day  Resolved Hospital Problem list     Assessment & Plan:   Left complicated pleural effusion Based on review of chest imaging the pleural effusion is new since 01/2020 and first noted on chest radiograph 05/13/20.  Pleural fluid studies indicate exudative  process based on LDH ratio. The etiology of the pleural effusion is most concerning for hemothorax s/p resuscitation from her recent hospitalization.  - Cytology showed lymphocytosis - TPA/DNAse therapy initiated 5/29. Will plan to complete a total of 6 treatments. Today will be the last instillation. - Safe to resume therapeutic anticoagulation this evening.  - Continue chest tube to -20 cmH2O - Antibiotics completed 6/1 - Will discuss the case with CT surgery to determine if there is any surgical intervention for the pleural thickening of the left lung   PCCM will continue to follow.   Labs   CBC: Recent Labs  Lab 06/06/20 2137 06/07/20 0823 06/08/20 0438 06/09/20 0528 06/10/20 0431 06/11/20 0440 06/12/20 0457 06/13/20 0504  WBC 12.9* 11.9* 10.8* 10.0 10.3 7.8 7.9 9.2  NEUTROABS 10.7* 10.0* 8.5* 7.6 7.5  --   --   --   HGB 9.3* 9.1* 9.4* 10.6* 9.8* 9.3* 10.0* 9.0*  HCT 30.5* 29.8* 31.3* 35.7* 32.8* 30.9* 32.6* 28.8*  MCV 82.2 82.1 83.2 84.2 82.6 82.0 80.5 79.6*  PLT 493* 481* 556* 586* 557* 589* 529* 579*    Basic Metabolic Panel: Recent Labs  Lab 06/07/20 0823 06/08/20 0438 06/09/20 0528 06/10/20 0431 06/11/20 0440 06/12/20 0457 06/12/20 0725 06/12/20 1029 06/13/20 0504  NA 137 140 137 137 136 135 133*  --  134*  K 4.8 4.5 5.0 5.1 5.0 7.0* 5.9*  --  5.9*  CL 100 104 102 103 105 106 102  --  102  CO2 33* 32 30 31 30 23 28   --  26  GLUCOSE 178* 78 170* 134* 158* 102* 111*  --  128*  BUN 27* 26* 27* 26* 26* 21 19  --  20  CREATININE 0.85 0.74 0.76 0.78 0.76 0.80 0.68  --  0.79  CALCIUM 8.4* 8.8* 8.6* 8.4* 8.3* 8.6* 8.4*  --  8.7*  MG 1.9 2.1 1.9 1.9  --   --   --  1.7  --    GFR: Estimated Creatinine Clearance: 66.5 mL/min (by C-G formula based on SCr of 0.79 mg/dL). Recent Labs  Lab 06/10/20 0431 06/11/20 0440 06/12/20 0457 06/13/20 0504  WBC 10.3 7.8 7.9 9.2    Liver Function Tests: Recent Labs  Lab 06/06/20 2137 06/07/20 0823 06/08/20 0438  AST  66* 47* 39  ALT 34 32 30  ALKPHOS 168* 155* 156*  BILITOT 0.5 0.9 0.7  PROT 7.5 7.2 7.4  7.3  ALBUMIN 2.2* 2.1* 2.3*   Recent Labs  Lab 06/06/20 2137  LIPASE 35   No results for input(s): AMMONIA in the last 168 hours.  ABG    Component Value Date/Time   PHART 7.401 05/12/2020 1602   PCO2ART 37.6 05/12/2020 1602   PO2ART 349 (H) 05/12/2020 1602   HCO3 23.5 05/12/2020 1602   TCO2 25 05/12/2020 1602   ACIDBASEDEF 1.0 05/12/2020 1602   O2SAT 73.7 05/20/2020 0520     Coagulation Profile: Recent Labs  Lab 06/07/20 1533  INR 1.8*    Cardiac Enzymes: No results for input(s): CKTOTAL, CKMB, CKMBINDEX, TROPONINI in the last 168 hours.  HbA1C: HbA1c, POC (controlled diabetic  range)  Date/Time Value Ref Range Status  12/30/2017 10:02 AM 9.4 (A) 0.0 - 7.0 % Final  09/06/2017 01:31 PM 8.8 (A) 0.0 - 7.0 % Final   HbA1c POC (<> result, manual entry)  Date/Time Value Ref Range Status  07/26/2019 11:08 AM >15.0 4.0 - 5.6 % Final   Hgb A1c MFr Bld  Date/Time Value Ref Range Status  05/13/2020 05:27 AM 10.5 (H) 4.8 - 5.6 % Final    Comment:    (NOTE) Pre diabetes:          5.7%-6.4%  Diabetes:              >6.4%  Glycemic control for   <7.0% adults with diabetes   02/04/2020 06:57 AM 10.4 (H) 4.8 - 5.6 % Final    Comment:    (NOTE) Pre diabetes:          5.7%-6.4%  Diabetes:              >6.4%  Glycemic control for   <7.0% adults with diabetes     CBG: Recent Labs  Lab 06/12/20 0746 06/12/20 1128 06/12/20 1627 06/12/20 2115 06/13/20 0809  GLUCAP 113* 153* 176* 266* 120*    Critical care time: n/a    Melody Comas, MD [place] [facility] Office: [phone]   See Amion for personal pager PCCM on call pager [phone] until 7pm. Please call Elink 7p-7a. [phone]

## 2020-06-13 NOTE — Procedures (Signed)
Pleural Fibrinolytic Administration Procedure Note  [name] [name]  [id number]  05-Jun-1950  Date:06/13/20  Time:10:17 AM   Provider Performing:Martha Soltys B Dura Mccormack   Procedure: Pleural Fibrinolysis Subsequent day [phone])  Indication(s) Fibrinolysis of complicated pleural effusion  Consent Risks of the procedure as well as the alternatives and risks of each were explained to the patient and/or caregiver.  Consent for the procedure was obtained.   Anesthesia None   Time Out Verified patient identification, verified procedure, site/side was marked, verified correct patient position, special equipment/implants available, medications/allergies/relevant history reviewed, required imaging and test results available.   Sterile Technique Hand hygiene, gloves   Procedure Description Existing pleural catheter was cleaned and accessed in sterile manner.  10mg  of tPA in 30cc of saline and 5mg  of dornase in 30cc of sterile water were injected into pleural space using existing pleural catheter.  Catheter will be clamped for 1 hour and then placed back to suction.   Complications/Tolerance None; patient tolerated the procedure well.  EBL None   Specimen(s) None

## 2020-06-13 NOTE — Progress Notes (Addendum)
Progress Note  Patient Name: [name] [name] Date of Encounter: 06/13/2020  [facility] Cardiologist: Thomasene Ripple, DO   Subjective   Patient states she is having more pain today and is waiting for her pain pills. She states she had BM last night. She denied SOB.   Inpatient Medications    Scheduled Meds: . alteplase (TPA) for intrapleural administration  10 mg Intrapleural Once   And  . pulmozyme (DORNASE) for intrapleural administration  5 mg Intrapleural Once  . amiodarone  200 mg Oral BID  . atorvastatin  80 mg Oral Daily  . enoxaparin (LOVENOX) injection  40 mg Subcutaneous Q24H  . ferrous gluconate  324 mg Oral Daily  . insulin aspart  0-5 Units Subcutaneous QHS  . insulin aspart  0-9 Units Subcutaneous TID WC  . insulin glargine  17 Units Subcutaneous Daily  . isosorbide-hydrALAZINE  2 tablet Oral TID  . magnesium oxide  400 mg Oral Daily  . polyethylene glycol  17 g Oral BID  . scopolamine  1 patch Transdermal Q72H  . senna-docusate  1 tablet Oral BID   Continuous Infusions: . sodium chloride Stopped (06/12/20 0350)   PRN Meds: sodium chloride, hydrALAZINE, HYDROmorphone (DILAUDID) injection, ketorolac, oxyCODONE   Vital Signs    Vitals:   06/12/20 1343 06/12/20 2114 06/13/20 0430 06/13/20 0441  BP: (!) 143/51 (!) 153/60  (!) 139/50  Pulse: 70 70  75  Resp: 16 18  (!) 22  Temp: 98 F (36.7 C) 98.6 F (37 C)  98.5 F (36.9 C)  TempSrc: Oral Oral  Oral  SpO2:  100%  99%  Weight:   78 kg   Height:        Intake/Output Summary (Last 24 hours) at 06/13/2020 1001 Last data filed at 06/13/2020 0513 Gross per 24 hour  Intake 360 ml  Output 910 ml  Net -550 ml   Last 3 Weights 06/13/2020 06/12/2020 06/11/2020  Weight (lbs) 171 lb 14.4 oz 176 lb 5.9 oz 171 lb 15.3 oz  Weight (kg) 77.973 kg 80 kg 78 kg      Telemetry    Sinus rhythm with arrhythmia 70s, artifacts, PACs  - Personally Reviewed  ECG    No new tracing today- Personally Reviewed  Physical  Exam   GEN: No acute distress.   Neck: No JVD Cardiac: RRR, no murmurs, rubs, or gallops. Chest wall tenderness on palpation  Respiratory: Clear to auscultation bilaterally. On 2LNC. Left side chest tube in place, serous drainage noted in collection chamber  GI: Abdomen obese soft, nontender, non-distended  MS: No BLE edema; No deformity. SCDs in place.  Neuro:  Nonfocal  Psych: Normal affect   Labs    High Sensitivity Troponin:   Recent Labs  Lab 06/07/20 0735  TROPONINIHS 35*      Chemistry Recent Labs  Lab 06/06/20 2137 06/07/20 0823 06/08/20 0438 06/09/20 0528 06/12/20 0457 06/12/20 0725 06/13/20 0504  NA 138 137 140   < > 135 133* 134*  K 4.7 4.8 4.5   < > 7.0* 5.9* 5.9*  CL 98 100 104   < > 106 102 102  CO2 37* 33* 32   < > 23 28 26   GLUCOSE 197* 178* 78   < > 102* 111* 128*  BUN 28* 27* 26*   < > 21 19 20   CREATININE 0.80 0.85 0.74   < > 0.80 0.68 0.79  CALCIUM 8.9 8.4* 8.8*   < > 8.6* 8.4* 8.7*  PROT 7.5 7.2 7.4  7.3  --   --   --   --   ALBUMIN 2.2* 2.1* 2.3*  --   --   --   --   AST 66* 47* 39  --   --   --   --   ALT 34 32 30  --   --   --   --   ALKPHOS 168* 155* 156*  --   --   --   --   BILITOT 0.5 0.9 0.7  --   --   --   --   GFRNONAA >60 >60 >60   < > >60 >60 >60  ANIONGAP 3* 4* 4*   < > 6 3* 6   < > = values in this interval not displayed.     Hematology Recent Labs  Lab 06/11/20 0440 06/12/20 0457 06/13/20 0504  WBC 7.8 7.9 9.2  RBC 3.77* 4.05 3.62*  HGB 9.3* 10.0* 9.0*  HCT 30.9* 32.6* 28.8*  MCV 82.0 80.5 79.6*  MCH 24.7* 24.7* 24.9*  MCHC 30.1 30.7 31.3  RDW 18.1* 18.4* 18.2*  PLT 589* 529* 579*    BNP Recent Labs  Lab 06/07/20 0735  BNP 707.6*     DDimer No results for input(s): DDIMER in the last 168 hours.   Radiology    CT CHEST W CONTRAST  Addendum Date: 06/12/2020   ADDENDUM REPORT: 06/12/2020 11:33 ADDENDUM: Discussed with Dr. Francine Graven. Some pleural thickening is identified in the LEFT hemithorax, particularly at  the anterior lung base. No discrete pleural mass. Electronically Signed   By: Ulyses Southward M.D.   On: 06/12/2020 11:33   Result Date: 06/12/2020 CLINICAL DATA:  Abnormal chest x-ray, pleural effusion, chest tube, recent CPR and subsequent anticoagulation EXAM: CT CHEST WITH CONTRAST TECHNIQUE: Multidetector CT imaging of the chest was performed during intravenous contrast administration. Sagittal and coronal MPR images reconstructed from axial data set. CONTRAST:  53mL OMNIPAQUE IOHEXOL 300 MG/ML  SOLN IV. COMPARISON:  04/07/2020 FINDINGS: Cardiovascular: Thoracic vascular structures patent. Coronary arterial calcifications. Aorta normal caliber. Mild enlargement of cardiac chambers. Small pericardial effusion again seen. Mediastinum/Nodes: Large RIGHT thyroid mass 4.0 x 3.5 cm image 13; this was recently assessed by ultrasound 05/14/2020 and percutaneous FNA was recommended. Base of cervical region otherwise unremarkable. Scattered normal sized axillary and mediastinal lymph nodes. No definite thoracic adenopathy. Mild wall thickening of mid to distal esophagus which could reflect esophagitis. Lungs/Pleura: Scattered subsegmental atelectasis throughout RIGHT lung. Minimal RIGHT pleural effusion. No RIGHT lung infiltrate. Calcified granuloma RIGHT upper lobe anteriorly image 51. Pigtail LEFT thoracostomy tube with decreased pleural effusion versus prior exam. Small LEFT pneumothorax. Significant atelectasis LEFT lower lobe, less LEFT upper lobe. No definite lung mass identified. Upper Abdomen: Gallbladder surgically absent. Mass versus infarct or sequela of trauma at lateral margin of spleen, 2.9 x 1.4 cm image 113. Remaining visualized upper abdomen unremarkable. Musculoskeletal: Healing fracture anterior LEFT second rib. Questionable nondisplaced fracture anterior LEFT third rib. Additional fractures of lateral RIGHT second third fourth and fifth ribs. Nondisplaced anterior wall fracture mid sternum. IMPRESSION:  Multiple BILATERAL rib fractures and sternal fracture. Decreased LEFT pleural effusion post placement of a pigtail LEFT thoracostomy tube. Small LEFT pneumothorax. Scattered subsegmental atelectasis throughout RIGHT lung with minimal RIGHT pleural effusion. Mass versus infarct or sequela of trauma at lateral margin of spleen 2.9 x 1.4 cm. Mild wall thickening of mid to distal esophagus which could reflect esophagitis. Coronary arterial calcifications. Known RIGHT thyroid  mass, previously assessed by ultrasound; please refer to ultrasound report of 05/14/2020. Electronically Signed: By: Ulyses Southward M.D. On: 06/11/2020 18:31   DG CHEST PORT 1 VIEW  Result Date: 06/13/2020 CLINICAL DATA:  Pleural effusion EXAM: PORTABLE CHEST 1 VIEW COMPARISON:  06/11/2020 FINDINGS: Pigtail pleural catheter on the left is unchanged in the left lung base. Pleural thickening on the left and left lower lobe airspace disease also unchanged. No pneumothorax. Right lung is clear without infiltrate or effusion. IMPRESSION: No interval change left pleural thickening and left lower lobe airspace disease. Electronically Signed   By: Marlan Palau M.D.   On: 06/13/2020 08:00    Cardiac Studies   Echo from 05/12/20:  1. Left ventricular ejection fraction, by estimation, is <20%. The left  ventricle has severely decreased function. The left ventricle demonstrates  global hypokinesis. There is severe concentric left ventricular  hypertrophy. Diastolic function is indeterminant due to atrial fibrillation. No LV thrombus visualized,  however, apex incompletely visualized due to lack of definity contrast.  2. Right ventricular systolic function is severely reduced. The right  ventricular size is mildly enlarged. There is mildly elevated pulmonary  artery systolic pressure. The estimated right ventricular systolic  pressure is 40.6 mmHg.  3. A large, circumferential pericardial effusion is present. The effusion  appears to be  greatest around the posterolateral LV measuring up to 2.2cm  at end-diastole. There is no RV or RA collapse to suggest tamponade. IVC  is dilated and not collapsible but patient is on the vent with significant TR, which are likely the  primary drivers of IVC dilation.  4. The mitral valve is normal in structure. Mild mitral valve regurgitation.  5. Tricuspid valve regurgitation is moderate to severe.  6. The aortic valve is tricuspid. There is mild calcification of the  aortic valve. There is mild thickening of the aortic valve. Aortic valve  regurgitation is trivial. Mild aortic valve sclerosis is present, with no  evidence of aortic valve stenosis.  7. Left atrial size was mildly dilated.   Comparison(s): Compared to prior TTE in 01/2020, the LVEF is now severely  reduced (<20%) and a large pericardial effusion is present (previously  small). The RV systolic function now appears severely reduced.  Patient Profile     70 y.o. female with a complex PMH of systolic and diastolic heart failure (<20%), paroxysmal A. 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 She did require TEE with DCCV 05/19/20 for atrial tachycardia. She was last discharged to SNF on 05/26/20 and returned to ER on 06/06/20 for right-sided abdominal pain, nausea, and vomiting. She was found to have large left loculated pleural effusion, started on cefepime and received  pigtail catheter placement by IR . Cardiology is consulted and following for CHF and chest pain.   Assessment & Plan    Complex multilocular left pleural effusion Small left pneumothorax  -s/p CT guided 16fr pigtail thoracostomy tube placement by IR, exudative effusion based on light's criteria, suspect hemothorax from recent  resuscitation, TPA/DNAse therapy initiated 5/29 per PCCM,  anticoagulation with Eliquis is currently held, please resume eliquis when medically cleared  - CT repeat 06/11/20 showed decreased LEFT pleural effusion and Small LEFT pneumothorax. - managed per primary team   Persistent A fib/A flutter/atrial tachycardia, resolved   - s/p TEE with DCCV on 05/19/20 for atrial tachycardia  with successful conversion to NSR, has remained in SR since  - reduce to amiodarone 200mg  daily/defer to MD, digoxin was discontinued this admission due to borderline elevated therapeutic level,  historically not tolerating AVN blocking agents due to bradycardia and pauses   Chronic systolic and diastolic heart failure  NICM - EF was 40-45% in 07/2019 and now <20% from 05/12/20 Echo - likely tachycardia induced CM + non-compliance with GDMT, failed to follow up for ischemic and infiltrative CM workup in the past  - clinically euvolemic, weight 180 >171>176 >171 pounds, Net -1L since admission - add FR <1.5L daily, continue monitor daily weight and I&O - GDMT: digoxin discontinued as above, spironolactone discontinued due to hyperkalemia, discontinued amlodipine in the setting of reduced EF, continue amiodarone for rhythm control, up-tritiated Isosorbide-hydralazine 20-37.5mg  2 tab TID, lasix 40mg  PRN if weight >190 pounds (per previous  advanced heart failure team recoomendation), will not add ARB/ARNi as she was prone to hyperkalemia, may add SGLT2i (Jardiance) if primary team ok   Hyperkalemia, persistent  - K 5.9 again this morning, no hemolysis on lab - not on hyperkalemia inducing medication  - Mag WNL  - consider repeat Lokelma and nephrology consult for further workup   Chest wall pain Multiple BILATERAL rib fractures and sternal fracture - CT chest 06/11/20 showed Multiple BILATERAL rib fractures and sternal fracture. 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 Known RIGHT thyroid mass - managed per primary team     For questions or updates, please contact [facility] Please consult [email or web address] for contact info under        Signed, 07/12/20, NP  06/13/2020, 10:01 AM    History and all data above reviewed.  Patient examined.  I agree with the findings as above.   She is clearly confused today.  She reports back pain and pain at her chest tube and is requesting "pain killers."   The patient exam reveals COR:RRR  ,  Lungs: Decreased breath sounds left greater than right   ,  Abd: Positive bowel sounds, no rebound no guarding, Ext No edema   .  All available labs, radiology testing, previous records reviewed. Agree with documented assessment and plan. Atrial fib:  Reduce amiodarone to once daily.  Start DOAC if medically cleared to do this.  We will sign off but please call with further questions or to arrange cardiology follow up at discharge.  Pericardial effusion:  She will need follow up of this I would suggest in early June.    08/13/2020 Dawnette Mione  11:18 AM  06/13/2020

## 2020-06-13 NOTE — Care Management Important Message (Signed)
Important Message  Patient Details IM Letter given to the Patient. Name: [name] [name] MRN: [id number] Date of Birth: 10/18/1950   Medicare Important Message Given:  Yes     Caren Macadam 06/13/2020, 11:16 AM

## 2020-06-13 NOTE — Progress Notes (Addendum)
PROGRESS NOTE    [name] [name]   ZOX:[id number]  DOB: 03-29-1950  PCP: Dana Allan, MD    DOA: 06/06/2020 LOS: 6   Brief Narrative   Patient 70 year old female history of CHF(EF<20%), paroxysmal A. fib on chronic anticoagulation, hypertension, hyperlipidemia, IDDM type II, bipolar disorder recently hospitalized for acute embolic stroke requiring tPA and mechanical thrombectomy with hospital course complicated by cardiogenic shock/PEA cardiac arrest immediately following thrombectomy.  During that hospitalization patient required milrinone infusion and diuretics.  Patient also required vasopressors was extubated and weaned off pressors on 05/13/2020.  Patient status post TEE/DCCV on 5/9 and discharged to [facility] 5/16.  Patient presented to the ED from SNF with right-sided abdominal pain, nausea vomiting, shortness of breath with speaking.  Patient noted to have a leukocytosis with a white count of 12.9, hemoglobin of 9.3, platelet count of 493.  Patient pancultured.  Chest x-ray showed complete opacification of the left hemithorax.  CT chest concerning for large complex multiloculated left pleural effusion increased in size since prior CT of 05/12/2020 with differential concerning for empyema versus sequelae of previous hemothorax versus neoplasm.  IR consulted and pigtail catheter drain placed with 950 cc of bloody fluid aspirated.  PCCM consulted and patient with fibrinolysis 06/08/2020.  Cardiology also consulted for management of chronic complicated cardiac history.   5/28 admitted to [facility], 45fr chest tube placed by IR  TPA/DNAse fibrinolysis 5/29, 5/30, 5/31, 6/1    Assessment & Plan   Principal Problem:   Pleural effusion Active Problems:   HTN (hypertension)   Mixed hyperlipidemia   Uncontrolled type 2 diabetes mellitus with hyperglycemia (HCC)   A-fib (HCC)   Pleural effusion on left   Right upper quadrant abdominal pain   Chest pain   QT prolongation   Large left  multiloculated exudative pleural effusion Pt presented with abdominal pain/chest pain, shortness of breath with speaking. -Exudative pleural effusion by lights criteria. -CT chest with large complex multilocular left pleural effusion increased in size since recent CT of the neck 05/12/2020. -IR consulted and pigtail drain catheter placed with 950 cc of bloody aspirate noted. CT repeat 06/11/20 showed decreased LEFT pleural effusion and Small LEFT pneumothorax  -PCCM/pulmonary following -status post tPA/DNase on 5/29, 30, 31, 6/1, 6/2, 6/3 -Continue to hold Eliquis - per PCCM can resume this evening. -Completed antibiotics 6/1 (treated w/Cefepime, Flagyl)  Right-sided abdominal pain/nausea and vomiting -Likely secondary to rib fractures and also possible problem #1 versus secondary to recent resuscitation and possible constipation -CT abdomen and pelvis with no acute intra-abdominal process. -Abdominal ultrasound unremarkable. -COVID-19 PCR negative, influenza A and B PCR negative. -Lipase within normal limits. -IV Toradol 15 mg every 6 hours as needed pain. -Oxycodone as needed. -Supportive care.  Hyperkalemia -initial a.m. reading of K7.0 (6/2) false, repeat 5.9. Given Lokelma 10 g once.  K still 5.9 today.  Pt reports she did have BM.   No associated EKG changes. Repeat Lokelma. Afternoon K level, LDH and CK  Chest pain -Likely secondary to problem #1. -Cardiology consulted - feel patient's chest pain syndrome is noncardiac with flat troponin trend.  Recent embolic CVA -Status post tPA and mechanical thrombectomy during prior hospitalization. -Eliquis has been discontinued secondary to bloody aspirate noted from pleural space with pigtail drain catheter. -resume eliquis when ok with PCCM (per note: Hold therapeutic anticoagulation for now while on tpa/dnase therapy)  Paroxysmal atrial fibrillation/status post TEE/ DCCV 5/9 -In NSR.   -Continue amiodarone.   -Eliquis on hold  secondary to problem #1.    QT prolongation -Concerning as patient noted to be on amiodarone. -Cardiology consulted: QT prolongation is normal on amiodarone.  Nonischemic cardiomyopathy/chronic systolic heart failure -Norvasc, digoxin, hydralazine, Imdur. -Not on spironolactone/ACE/ARB due to history of hyperkalemia. -Not on metoprolol due to allergy. -Not on Entresto or farxiga due to orthostasis -Per cardiology.  Hypertension -Continue Norvasc.   -Hydralazine and Imdur have been uptitrated per cardiology for better blood pressure control. -Per cardiology.    Hyperlipidemia -Continue statin.    Poorly controlled insulin-dependent type 2 diabetes mellitus -Hemoglobin A1c 10.5 (05/13/2020) -Continue Lantus, SSI.  Titrate insulin for inpatient goal 140-180  Constipation -Continue MiraLAX twice daily -Placed on Senokot-S twice daily -Smog enema x1.   Patient BMI: Body mass index is 30.45 kg/m.   DVT prophylaxis: enoxaparin (LOVENOX) injection 40 mg Start: 06/11/20 1800 Place and maintain sequential compression device Start: 06/08/20 1841 Place and maintain sequential compression device Start: 06/07/20 1434   Diet:  Diet Orders (From admission, onward)    Start     Ordered   06/11/20 1030  Diet Heart Room service appropriate? Yes; Fluid consistency: Thin; Fluid restriction: 1500 mL Fluid  Diet effective now       Question Answer Comment  Room service appropriate? Yes   Fluid consistency: Thin   Fluid restriction: 1500 mL Fluid      06/11/20 1029            Code Status: Full Code    Subjective 06/13/20    Patient seen at bedside this morning.  She reports having a BM after taking Lokelma yesterday.  Asking for pain medicine to be brought in.  Denies any other acute complaints.   Disposition Plan & Communication   Status is: Inpatient  Remains inpatient appropriate because:Inpatient level of care appropriate due to severity of illness with chest tube in  place and receiving fibrinolysis with pulmonary for complicated pleural effusion.   Dispo: The patient is from: Home              Anticipated d/c is to: Home  (SNF recommended)              Patient currently is not medically stable to d/c.   Difficult to place patient No   Consults, Procedures, Significant Events   Consultants:   Pulmonology  Cardiology  Interventional radiology  Procedures:   CT chest 06/07/2020  CT abdomen and pelvis 06/06/2020  Chest x-ray 06/06/2020, 06/08/2020  Abdominal ultrasound 06/07/2020  CT-guided placement of 14 French all-purpose drain catheter into left pleural space with aspiration of 950 cc of bloody fluid--- Per Dr. Grace Isaac, IR 06/07/2020  Pleural fibrinolysis Per PCCM, Dr. Francine Graven 06/08/2020  Second pleural fibrinolysis pending 06/09/2020    Antimicrobials:  Anti-infectives (From admission, onward)   Start     Dose/Rate Route Frequency Ordered Stop   06/10/20 1400  metroNIDAZOLE (FLAGYL) tablet 500 mg  Status:  Discontinued        500 mg Oral Every 8 hours 06/10/20 1142 06/12/20 1356   06/08/20 0000  vancomycin (VANCOCIN) IVPB 1000 mg/200 mL premix  Status:  Discontinued        1,000 mg 200 mL/hr over 60 Minutes Intravenous Every 24 hours 06/07/20 0647 06/09/20 0800   06/07/20 1000  metroNIDAZOLE (FLAGYL) IVPB 500 mg  Status:  Discontinued        500 mg 100 mL/hr over 60 Minutes Intravenous Every 8 hours 06/07/20 0635 06/10/20 1142   06/07/20 1000  ceFEPIme (  MAXIPIME) 2 g in sodium chloride 0.9 % 100 mL IVPB  Status:  Discontinued        2 g 200 mL/hr over 30 Minutes Intravenous Every 8 hours 06/07/20 0647 06/12/20 1356   06/07/20 0115  vancomycin (VANCOCIN) IVPB 1000 mg/200 mL premix        1,000 mg 200 mL/hr over 60 Minutes Intravenous  Once 06/07/20 0114 06/07/20 0512   06/07/20 0115  ceFEPIme (MAXIPIME) 1 g in sodium chloride 0.9 % 100 mL IVPB  Status:  Discontinued        1 g 200 mL/hr over 30 Minutes Intravenous  Once 06/07/20 0114  06/07/20 0114   06/07/20 0115  metroNIDAZOLE (FLAGYL) IVPB 500 mg        500 mg 100 mL/hr over 60 Minutes Intravenous  Once 06/07/20 0114 06/07/20 0512   06/07/20 0115  ceFEPIme (MAXIPIME) 2 g in sodium chloride 0.9 % 100 mL IVPB        2 g 200 mL/hr over 30 Minutes Intravenous  Once 06/07/20 0114 06/07/20 0512        Micro    Objective   Vitals:   06/12/20 2114 06/13/20 0430 06/13/20 0441 06/13/20 1140  BP: (!) 153/60  (!) 139/50 (!) 149/52  Pulse: 70  75 78  Resp: 18  (!) 22 20  Temp: 98.6 F (37 C)  98.5 F (36.9 C) 97.7 F (36.5 C)  TempSrc: Oral  Oral   SpO2: 100%  99% 100%  Weight:  78 kg    Height:        Intake/Output Summary (Last 24 hours) at 06/13/2020 1555 Last data filed at 06/13/2020 1100 Gross per 24 hour  Intake 510 ml  Output 910 ml  Net -400 ml   Filed Weights   06/11/20 0557 06/12/20 0421 06/13/20 0430  Weight: 78 kg 80 kg 78 kg    Physical Exam:  General exam: awake, alert, no acute distress Respiratory system: CTAB left-sided Pleurx catheter in place with serosanguineous fluid in chamber Cardiovascular system: RRR, no peripheral edema Central nervous system: A&O x3. no gross focal neurologic deficits, normal speech GI - soft non-tender  Labs   Data Reviewed: I have personally reviewed following labs and imaging studies  CBC: Recent Labs  Lab 06/06/20 2137 06/07/20 0823 06/08/20 0438 06/09/20 0528 06/10/20 0431 06/11/20 0440 06/12/20 0457 06/13/20 0504  WBC 12.9* 11.9* 10.8* 10.0 10.3 7.8 7.9 9.2  NEUTROABS 10.7* 10.0* 8.5* 7.6 7.5  --   --   --   HGB 9.3* 9.1* 9.4* 10.6* 9.8* 9.3* 10.0* 9.0*  HCT 30.5* 29.8* 31.3* 35.7* 32.8* 30.9* 32.6* 28.8*  MCV 82.2 82.1 83.2 84.2 82.6 82.0 80.5 79.6*  PLT 493* 481* 556* 586* 557* 589* 529* 579*   Basic Metabolic Panel: Recent Labs  Lab 06/07/20 0823 06/08/20 0438 06/09/20 0528 06/10/20 0431 06/11/20 0440 06/12/20 0457 06/12/20 0725 06/12/20 1029 06/13/20 0504  NA 137 140 137 137  136 135 133*  --  134*  K 4.8 4.5 5.0 5.1 5.0 7.0* 5.9*  --  5.9*  CL 100 104 102 103 105 106 102  --  102  CO2 33* 32 30 31 30 23 28   --  26  GLUCOSE 178* 78 170* 134* 158* 102* 111*  --  128*  BUN 27* 26* 27* 26* 26* 21 19  --  20  CREATININE 0.85 0.74 0.76 0.78 0.76 0.80 0.68  --  0.79  CALCIUM 8.4* 8.8* 8.6* 8.4* 8.3* 8.6*  8.4*  --  8.7*  MG 1.9 2.1 1.9 1.9  --   --   --  1.7  --    GFR: Estimated Creatinine Clearance: 66.5 mL/min (by C-G formula based on SCr of 0.79 mg/dL). Liver Function Tests: Recent Labs  Lab 06/06/20 2137 06/07/20 0823 06/08/20 0438  AST 66* 47* 39  ALT 34 32 30  ALKPHOS 168* 155* 156*  BILITOT 0.5 0.9 0.7  PROT 7.5 7.2 7.4  7.3  ALBUMIN 2.2* 2.1* 2.3*   Recent Labs  Lab 06/06/20 2137  LIPASE 35   No results for input(s): AMMONIA in the last 168 hours. Coagulation Profile: Recent Labs  Lab 06/07/20 1533  INR 1.8*   Cardiac Enzymes: No results for input(s): CKTOTAL, CKMB, CKMBINDEX, TROPONINI in the last 168 hours. BNP (last 3 results) No results for input(s): PROBNP in the last 8760 hours. HbA1C: No results for input(s): HGBA1C in the last 72 hours. CBG: Recent Labs  Lab 06/12/20 1128 06/12/20 1627 06/12/20 2115 06/13/20 0809 06/13/20 1137  GLUCAP 153* 176* 266* 120* 128*   Lipid Profile: No results for input(s): CHOL, HDL, LDLCALC, TRIG, CHOLHDL, LDLDIRECT in the last 72 hours. Thyroid Function Tests: No results for input(s): TSH, T4TOTAL, FREET4, T3FREE, THYROIDAB in the last 72 hours. Anemia Panel: No results for input(s): VITAMINB12, FOLATE, FERRITIN, TIBC, IRON, RETICCTPCT in the last 72 hours. Sepsis Labs: No results for input(s): PROCALCITON, LATICACIDVEN in the last 168 hours.  Recent Results (from the past 240 hour(s))  Culture, blood (routine x 2)     Status: None   Collection Time: 06/07/20  2:10 AM   Specimen: BLOOD  Result Value Ref Range Status   Specimen Description   Final    BLOOD LEFT  ANTECUBITAL Performed at [facility], [address], [place], [place] [address]    Special Requests   Final    BOTTLES DRAWN AEROBIC AND ANAEROBIC Blood Culture adequate volume Performed at [facility], [address], [place], [place] [address]    Culture   Final    NO GROWTH 5 DAYS Performed at [facility], [address] [place], [place] [address]    Report Status 06/12/2020 FINAL  Final  Culture, blood (Routine X 2) w Reflex to ID Panel     Status: None   Collection Time: 06/07/20  2:15 AM   Specimen: BLOOD  Result Value Ref Range Status   Specimen Description   Final    BLOOD BLOOD RIGHT HAND Performed at [facility], [address], [place], [place] [address]    Special Requests   Final    BOTTLES DRAWN AEROBIC ONLY Blood Culture adequate volume Performed at [facility], [address], [place], [place] [address]    Culture   Final    NO GROWTH 5 DAYS Performed at [facility], [address], [place], [place] [address]    Report Status 06/12/2020 FINAL  Final  Resp Panel by RT-PCR (Flu A&B, Covid) Nasopharyngeal Swab     Status: None   Collection Time: 06/07/20  2:26 AM   Specimen: Nasopharyngeal Swab; Nasopharyngeal(NP) swabs in vial transport medium  Result Value Ref Range Status   SARS Coronavirus 2 by RT PCR NEGATIVE NEGATIVE Final    Comment: (NOTE) SARS-CoV-2 target nucleic acids are NOT DETECTED.  The SARS-CoV-2 RNA is generally detectable in upper respiratory specimens during the acute phase of infection. The lowest concentration of SARS-CoV-2  viral copies this assay can detect is 138 copies/mL. A negative result does not preclude SARS-Cov-2 infection and should not be used as the sole basis for treatment or other patient management decisions. A negative result may occur with  improper specimen collection/handling, submission of specimen other than nasopharyngeal  swab, presence of viral mutation(s) within the areas targeted by this assay, and inadequate number of viral copies(<138 copies/mL). A negative result must be combined with clinical observations, patient history, and epidemiological information. The expected result is Negative.  Fact Sheet for Patients:  BloggerCourse.com  Fact Sheet for Healthcare Providers:  SeriousBroker.it  This test is no t yet approved or cleared by the [place] FDA and  has been authorized for detection and/or diagnosis of SARS-CoV-2 by FDA under an Emergency Use Authorization (EUA). This EUA will remain  in effect (meaning this test can be used) for the duration of the COVID-19 declaration under Section 564(b)(1) of the Act, 21 U.S.C.section 360bbb-3(b)(1), unless the authorization is terminated  or revoked sooner.       Influenza A by PCR NEGATIVE NEGATIVE Final   Influenza B by PCR NEGATIVE NEGATIVE Final    Comment: (NOTE) The Xpert Xpress SARS-CoV-2/FLU/RSV plus assay is intended as an aid in the diagnosis of influenza from Nasopharyngeal swab specimens and should not be used as a sole basis for treatment. Nasal washings and aspirates are unacceptable for Xpert Xpress SARS-CoV-2/FLU/RSV testing.  Fact Sheet for Patients: BloggerCourse.com  Fact Sheet for Healthcare Providers: SeriousBroker.it  This test is not yet approved or cleared by the [place] FDA and has been authorized for detection and/or diagnosis of SARS-CoV-2 by FDA under an Emergency Use Authorization (EUA). This EUA will remain in effect (meaning this test can be used) for the duration of the COVID-19 declaration under Section 564(b)(1) of the Act, 21 U.S.C. section 360bbb-3(b)(1), unless the authorization is terminated or revoked.  Performed at [facility], [address] [place], [place] [address]    Culture, Urine     Status: Abnormal   Collection Time: 06/07/20  6:38 AM   Specimen: Urine, Random  Result Value Ref Range Status   Specimen Description   Final    URINE, RANDOM Performed at [facility], [address], [place], [place] [address]    Special Requests   Final    NONE Performed at [facility], [address], [place], [place] [address]    Culture MULTIPLE SPECIES PRESENT, SUGGEST RECOLLECTION (A)  Final   Report Status 06/08/2020 FINAL  Final  Fungus Culture With Stain     Status: None (Preliminary result)   Collection Time: 06/07/20 12:44 PM   Specimen: Pleural Fluid  Result Value Ref Range Status   Fungus Stain Final report  Final    Comment: (NOTE) Performed At: [facility] [address] [place], [place] [id number] Jolene Schimke MD RC:[id number]    Fungus (Mycology) Culture PENDING  Incomplete   Fungal Source PLEURAL  Final    Comment: LEFT Performed at [facility], [address], [place], [place] [address]   Body fluid culture w Gram Stain     Status: None   Collection Time: 06/07/20 12:44 PM   Specimen: Pleura  Result Value Ref Range Status   Specimen Description   Final    PLEURAL LEFT Performed at [facility], [address], [place], [place] [address]    Special Requests   Final    NONE  Performed at [facility], [address] [place], [place] [address]    Gram Stain   Final    RARE WBC PRESENT,BOTH PMN AND MONONUCLEAR NO ORGANISMS SEEN    Culture   Final    NO GROWTH 3 DAYS Performed at [facility], [address] [place], [place] [address]    Report Status 06/10/2020 FINAL  Final  Fungus Culture Result     Status: None   Collection Time: 06/07/20 12:44 PM  Result Value Ref Range Status   Result 1 Comment  Final    Comment: (NOTE) KOH/Calcofluor preparation:  no fungus observed. Performed At: [facility]  [place] [address] [place], [place] [id number] Jolene SchimkeNagendra Sanjai MD WG:[id number]:[phone]   MRSA PCR Screening     Status: None   Collection Time: 06/09/20  8:12 AM   Specimen: Nasal Mucosa; Nasopharyngeal  Result Value Ref Range Status   MRSA by PCR NEGATIVE NEGATIVE Final    Comment:        The GeneXpert MRSA Assay (FDA approved for NASAL specimens only), is one component of a comprehensive MRSA colonization surveillance program. It is not intended to diagnose MRSA infection nor to guide or monitor treatment for MRSA infections. Performed at [facility], [address] [place], [place] [address]       Imaging Studies   CT CHEST W CONTRAST  Addendum Date: 06/12/2020   ADDENDUM REPORT: 06/12/2020 11:33 ADDENDUM: Discussed with Dr. Francine Gravenewald. Some pleural thickening is identified in the LEFT hemithorax, particularly at the anterior lung base. No discrete pleural mass. Electronically Signed   By: Ulyses SouthwardMark  Boles M.D.   On: 06/12/2020 11:33   Result Date: 06/12/2020 CLINICAL DATA:  Abnormal chest x-ray, pleural effusion, chest tube, recent CPR and subsequent anticoagulation EXAM: CT CHEST WITH CONTRAST TECHNIQUE: Multidetector CT imaging of the chest was performed during intravenous contrast administration. Sagittal and coronal MPR images reconstructed from axial data set. CONTRAST:  75mL OMNIPAQUE IOHEXOL 300 MG/ML  SOLN IV. COMPARISON:  04/07/2020 FINDINGS: Cardiovascular: Thoracic vascular structures patent. Coronary arterial calcifications. Aorta normal caliber. Mild enlargement of cardiac chambers. Small pericardial effusion again seen. Mediastinum/Nodes: Large RIGHT thyroid mass 4.0 x 3.5 cm image 13; this was recently assessed by ultrasound 05/14/2020 and percutaneous FNA was recommended. Base of cervical region otherwise unremarkable. Scattered normal sized axillary and mediastinal lymph nodes. No definite thoracic adenopathy. Mild wall thickening of mid to distal esophagus  which could reflect esophagitis. Lungs/Pleura: Scattered subsegmental atelectasis throughout RIGHT lung. Minimal RIGHT pleural effusion. No RIGHT lung infiltrate. Calcified granuloma RIGHT upper lobe anteriorly image 51. Pigtail LEFT thoracostomy tube with decreased pleural effusion versus prior exam. Small LEFT pneumothorax. Significant atelectasis LEFT lower lobe, less LEFT upper lobe. No definite lung mass identified. Upper Abdomen: Gallbladder surgically absent. Mass versus infarct or sequela of trauma at lateral margin of spleen, 2.9 x 1.4 cm image 113. Remaining visualized upper abdomen unremarkable. Musculoskeletal: Healing fracture anterior LEFT second rib. Questionable nondisplaced fracture anterior LEFT third rib. Additional fractures of lateral RIGHT second third fourth and fifth ribs. Nondisplaced anterior wall fracture mid sternum. IMPRESSION: Multiple BILATERAL rib fractures and sternal fracture. Decreased LEFT pleural effusion post placement of a pigtail LEFT thoracostomy tube. Small LEFT pneumothorax. Scattered subsegmental atelectasis throughout RIGHT lung with minimal RIGHT pleural effusion. Mass versus infarct or sequela of trauma at lateral margin of spleen 2.9 x 1.4 cm. Mild wall thickening of mid to distal esophagus which could reflect esophagitis. Coronary arterial calcifications.  Known RIGHT thyroid mass, previously assessed by ultrasound; please refer to ultrasound report of 05/14/2020. Electronically Signed: By: Ulyses Southward M.D. On: 06/11/2020 18:31   DG CHEST PORT 1 VIEW  Result Date: 06/13/2020 CLINICAL DATA:  Pleural effusion EXAM: PORTABLE CHEST 1 VIEW COMPARISON:  06/11/2020 FINDINGS: Pigtail pleural catheter on the left is unchanged in the left lung base. Pleural thickening on the left and left lower lobe airspace disease also unchanged. No pneumothorax. Right lung is clear without infiltrate or effusion. IMPRESSION: No interval change left pleural thickening and left lower lobe  airspace disease. Electronically Signed   By: Marlan Palau M.D.   On: 06/13/2020 08:00     Medications   Scheduled Meds: . [START ON 06/14/2020] amiodarone  200 mg Oral Daily  . atorvastatin  80 mg Oral Daily  . enoxaparin (LOVENOX) injection  40 mg Subcutaneous Q24H  . ferrous gluconate  324 mg Oral Daily  . insulin aspart  0-5 Units Subcutaneous QHS  . insulin aspart  0-9 Units Subcutaneous TID WC  . insulin glargine  17 Units Subcutaneous Daily  . isosorbide-hydrALAZINE  2 tablet Oral TID  . magnesium oxide  400 mg Oral Daily  . polyethylene glycol  17 g Oral BID  . scopolamine  1 patch Transdermal Q72H  . senna-docusate  1 tablet Oral BID   Continuous Infusions: . sodium chloride Stopped (06/12/20 0350)       LOS: 6 days    Time spent: 20 minutes    Pennie Banter, DO [facility]  06/13/2020, 3:55 PM      If 7PM-7AM, please contact night-coverage. How to contact the [facility] Attending or Consulting provider 7A - 7P or covering provider during after hours 7P -7A, for this patient?    1. Check the care team in [facility] and look for a) attending/consulting [facility] provider listed and b) the [facility] team listed 2. Log into [email or web address] and use [facility] universal password to access. If you do not have the password, please contact the hospital operator. 3. Locate the [facility] provider you are looking for under [facility] and page to a number that you can be directly reached. 4. If you still have difficulty reaching the provider, please page the [facility] (Director on Call) for the Hospitalists listed on amion for assistance.

## 2020-06-14 DIAGNOSIS — J90 Pleural effusion, not elsewhere classified: Secondary | ICD-10-CM | POA: Diagnosis not present

## 2020-06-14 LAB — CBC
HCT: 30.8 % — ABNORMAL LOW (ref 36.0–46.0)
Hemoglobin: 9.7 g/dL — ABNORMAL LOW (ref 12.0–15.0)
MCH: 24.6 pg — ABNORMAL LOW (ref 26.0–34.0)
MCHC: 31.5 g/dL (ref 30.0–36.0)
MCV: 78 fL — ABNORMAL LOW (ref 80.0–100.0)
Platelets: 524 10*3/uL — ABNORMAL HIGH (ref 150–400)
RBC: 3.95 MIL/uL (ref 3.87–5.11)
RDW: 18.2 % — ABNORMAL HIGH (ref 11.5–15.5)
WBC: 7.5 10*3/uL (ref 4.0–10.5)
nRBC: 0 % (ref 0.0–0.2)

## 2020-06-14 LAB — BASIC METABOLIC PANEL
Anion gap: 6 (ref 5–15)
BUN: 16 mg/dL (ref 8–23)
CO2: 26 mmol/L (ref 22–32)
Calcium: 8.6 mg/dL — ABNORMAL LOW (ref 8.9–10.3)
Chloride: 101 mmol/L (ref 98–111)
Creatinine, Ser: 0.57 mg/dL (ref 0.44–1.00)
GFR, Estimated: >60 mL/min (ref >60)
Glucose, Bld: 66 mg/dL — ABNORMAL LOW (ref 70–99)
Potassium: 5.1 mmol/L (ref 3.5–5.1)
Sodium: 133 mmol/L — ABNORMAL LOW (ref 135–145)

## 2020-06-14 LAB — GLUCOSE, CAPILLARY
Glucose-Capillary: 57 mg/dL — ABNORMAL LOW (ref 70–99)
Glucose-Capillary: 131 mg/dL — ABNORMAL HIGH (ref 70–99)
Glucose-Capillary: 303 mg/dL — ABNORMAL HIGH (ref 70–99)
Glucose-Capillary: 161 mg/dL — ABNORMAL HIGH (ref 70–99)

## 2020-06-14 MED ORDER — BOOST / RESOURCE BREEZE PO LIQD CUSTOM
1.0000 | ORAL | Status: DC
  Administered 2020-06-14 – 2020-06-20 (×7): 1 via ORAL

## 2020-06-14 NOTE — Progress Notes (Signed)
NAME:  [name] [name], MRN:  [id number], DOB:  May 22, 1950, LOS: 7 ADMISSION DATE:  06/06/2020, CONSULTATION DATE:  06/07/20 REFERRING MD:  Ramiro Harvest, MD CHIEF COMPLAINT:  Pleural effusion  History of Present Illness:  [name] [name] is a 69 year old woman with CHF, paroxysmal atrial fibrillation, and DMII who was recently admitted 5/2 to 5/16 for acute embolic stroke due to PAG s/p TPA therapy and mechanical thrombectomy. Her course was complicated by cardiac arrest and cardiogenic shock. She required milrinone drip and cardioversion for the atrial fibrillation. She has been eliquis for anticoagulation after the stroke. She was sent to [facility] on 5/16 where she presented from with right sided abdominal pain, nausea and vomiting. She also complained of some shortness of breath and has been on 3L of O2 since being at [place].   Work up in the ED revealed left hemithorax opacification based on chest radiograph and CT chest showed loculated pleural effusion involving the left hemithorax.   IR was consulted for chest tube placement and a 58fr pigtail catheter was placed via CT guidance with of bloody appearing fluid removed initially.   PCCM was consulted for further evaluation and treatment of the left pleural effusion.   Pleural fluid studies show LDH 1636, total protein 3.1, glucose 134, albumin 1.5. Cell count is pending at this time.   Pertinent  Medical History  Atrial Fibrillation Heart Failure DMII  Significant Hospital Events: Including procedures, antibiotic start and stop dates in addition to other pertinent events   . 5/28 admitted to [facility], 33fr chest tube placed by IR . 5/29 1st dose of TPA/DNAse . 5/30 2nd dose of TPA/DNAse . 5/31 3rd dose of TPA/DNAse . 6/1 4th dose of TPA/DNAse . 6/2 5th dose of TPA/DNAse . 6/3 6th dose of TAP/DNAse   Interim History / Subjective:  Feeling much better overall since Chest tube/ drainage, cp well controlled     Objective    Blood pressure 140/64, pulse 72, temperature 97.7 F (36.5 C), resp. rate (!) 22, height 5\' 3"  (1.6 m), weight 77.6 kg, SpO2 95 %.        Intake/Output Summary (Last 24 hours) at 06/14/2020 0741 Last data filed at 06/14/2020 0700 Gross per 24 hour  Intake 1050 ml  Output 1010 ml  Net 40 ml   Filed Weights   06/12/20 0421 06/13/20 0430 06/14/20 0342  Weight: 80 kg 78 kg 77.6 kg    Examination: General: tmax  98.2  General appearance:    Elderly bf nad  At Rest 02 sats  95% on 1lpm   No jvd Oropharynx clear,  mucosa nl Neck supple Lungs with decreased bs on L / no fluctuation on Chest tube with cough  RRR no s3 or or sign murmur Abd obese with nl  excursion  Extr warm with no edema or clubbing noted       Assessment & Plan:   Left complicated pleural effusion Based on review of chest imaging the pleural effusion is new since 01/2020 and first noted on chest radiograph 05/13/20.  Pleural fluid studies indicate exudative process based on LDH ratio. The etiology of the pleural effusion is most concerning for hemothorax s/p resuscitation from her recent hospitalization.  - Cytology showed lymphocytosis - TPA/DNAse therapy initiated 5/29 >>completed a total of 6 treatments.  6/3 = final  instillation. - Safe to continue anticoag - Continue chest tube to -20 cmH2O - Antibiotics completed 6/1     >>> f/u PA  and Lateral cxr      Labs   CBC: Recent Labs  Lab 06/07/20 0823 06/08/20 0438 06/09/20 0528 06/10/20 0431 06/11/20 0440 06/12/20 0457 06/13/20 0504 06/14/20 0535  WBC 11.9* 10.8* 10.0 10.3 7.8 7.9 9.2 7.5  NEUTROABS 10.0* 8.5* 7.6 7.5  --   --   --   --   HGB 9.1* 9.4* 10.6* 9.8* 9.3* 10.0* 9.0* 9.7*  HCT 29.8* 31.3* 35.7* 32.8* 30.9* 32.6* 28.8* 30.8*  MCV 82.1 83.2 84.2 82.6 82.0 80.5 79.6* 78.0*  PLT 481* 556* 586* 557* 589* 529* 579* 524*    Basic Metabolic Panel: Recent Labs  Lab 06/07/20 0823 06/08/20 0438 06/09/20 0528 06/10/20 0431 06/11/20 0440  06/12/20 0457 06/12/20 0725 06/12/20 1029 06/13/20 0504 06/13/20 1720 06/14/20 0535  NA 137 140 137 137 136 135 133*  --  134*  --  133*  K 4.8 4.5 5.0 5.1 5.0 7.0* 5.9*  --  5.9* 5.7* 5.1  CL 100 104 102 103 105 106 102  --  102  --  101  CO2 33* 32 30 31 30 23 28   --  26  --  26  GLUCOSE 178* 78 170* 134* 158* 102* 111*  --  128*  --  66*  BUN 27* 26* 27* 26* 26* 21 19  --  20  --  16  CREATININE 0.85 0.74 0.76 0.78 0.76 0.80 0.68  --  0.79  --  0.57  CALCIUM 8.4* 8.8* 8.6* 8.4* 8.3* 8.6* 8.4*  --  8.7*  --  8.6*  MG 1.9 2.1 1.9 1.9  --   --   --  1.7  --   --   --    GFR: Estimated Creatinine Clearance: 66.4 mL/min (by C-G formula based on SCr of 0.57 mg/dL). Recent Labs  Lab 06/11/20 0440 06/12/20 0457 06/13/20 0504 06/14/20 0535  WBC 7.8 7.9 9.2 7.5    Liver Function Tests: Recent Labs  Lab 06/07/20 0823 06/08/20 0438  AST 47* 39  ALT 32 30  ALKPHOS 155* 156*  BILITOT 0.9 0.7  PROT 7.2 7.4  7.3  ALBUMIN 2.1* 2.3*   No results for input(s): LIPASE, AMYLASE in the last 168 hours. No results for input(s): AMMONIA in the last 168 hours.  ABG    Component Value Date/Time   PHART 7.401 05/12/2020 1602   PCO2ART 37.6 05/12/2020 1602   PO2ART 349 (H) 05/12/2020 1602   HCO3 23.5 05/12/2020 1602   TCO2 25 05/12/2020 1602   ACIDBASEDEF 1.0 05/12/2020 1602   O2SAT 73.7 05/20/2020 0520     Coagulation Profile: Recent Labs  Lab 06/07/20 1533  INR 1.8*    Cardiac Enzymes: Recent Labs  Lab 06/13/20 1720  CKTOTAL 255*    HbA1C: HbA1c, POC (controlled diabetic range)  Date/Time Value Ref Range Status  12/30/2017 10:02 AM 9.4 (A) 0.0 - 7.0 % Final  09/06/2017 01:31 PM 8.8 (A) 0.0 - 7.0 % Final   HbA1c POC (<> result, manual entry)  Date/Time Value Ref Range Status  07/26/2019 11:08 AM >15.0 4.0 - 5.6 % Final   Hgb A1c MFr Bld  Date/Time Value Ref Range Status  05/13/2020 05:27 AM 10.5 (H) 4.8 - 5.6 % Final    Comment:    (NOTE) Pre diabetes:           5.7%-6.4%  Diabetes:              >6.4%  Glycemic control for   <7.0% adults with  diabetes   02/04/2020 06:57 AM 10.4 (H) 4.8 - 5.6 % Final    Comment:    (NOTE) Pre diabetes:          5.7%-6.4%  Diabetes:              >6.4%  Glycemic control for   <7.0% adults with diabetes     CBG: Recent Labs  Lab 06/13/20 0809 06/13/20 1137 06/13/20 1718 06/13/20 2027 06/14/20 0726  GLUCAP 120* 128* 113* 81 57*     Sandrea Hughs, MD Pulmonary and Critical Care Medicine [facility] Cell [phone]   After 7:00 pm call Elink  [phone]

## 2020-06-14 NOTE — Progress Notes (Signed)
PROGRESS NOTE    [name] Councilmanatricia G Hanger   GNF:[id number]  DOB: Dec 29, 1950  PCP: Dana AllanWalsh, Tanya, MD    DOA: 06/06/2020 LOS: 7   Brief Narrative   Patient 70 year old female history of CHF(EF<20%), paroxysmal A. fib on chronic anticoagulation, hypertension, hyperlipidemia, IDDM type II, bipolar disorder recently hospitalized for acute embolic stroke requiring tPA and mechanical thrombectomy with hospital course complicated by cardiogenic shock/PEA cardiac arrest immediately following thrombectomy.  During that hospitalization patient required milrinone infusion and diuretics.  Patient also required vasopressors was extubated and weaned off pressors on 05/13/2020.  Patient status post TEE/DCCV on 5/9 and [facility] 5[facility].  Patient presented to the ED from SNF with right-sided abdominal pain, nausea vomiting, shortness of breath with speaking.  Patient noted to have a leukocytosis with a white count of 12.9, hemoglobin of 9.3, platelet count of 493.  Patient pancultured.  Chest x-ray showed complete opacification of the left hemithorax.  CT chest concerning for large complex multiloculated left pleural effusion increased in size since prior CT of 05/12/2020 with differential concerning for empyema versus sequelae of previous hemothorax versus neoplasm.  IR consulted and pigtail catheter drain placed with 950 cc of bloody fluid aspirated.  PCCM consulted and patient with fibrinolysis 06/08/2020.  Cardiology also consulted for management of chronic complicated cardiac history.   [facility], [facility] chest tube placed by IR  TPA/DNAse fibrinolysis 5/29, 5/30, 5/31, 6/1    Assessment & Plan   Principal Problem:   Pleural effusion Active Problems:   HTN (hypertension)   Mixed hyperlipidemia   Uncontrolled type 2 diabetes mellitus with hyperglycemia (HCC)   A-fib (HCC)   Pleural effusion on left   Right upper quadrant abdominal pain   Chest pain   QT prolongation   Large left  multiloculated exudative pleural effusion Pt presented with abdominal pain/chest pain, shortness of breath with speaking. -Exudative pleural effusion by lights criteria. -CT chest with large complex multilocular left pleural effusion increased in size since recent CT of the neck 05/12/2020. -IR consulted and pigtail drain catheter placed with 950 cc of bloody aspirate noted. CT repeat 06/11/20 showed decreased LEFT pleural effusion and Small LEFT pneumothorax  -PCCM/pulmonary following - see their recs -status post tPA/DNase on 5/29, 30, 31, 6/1, 6/2, 6/3 -Continue to hold Eliquis - per PCCM can resume this evening. -Completed antibiotics 6/1 (treated w/Cefepime, Flagyl)  Right-sided abdominal pain/nausea and vomiting -Likely secondary to rib fractures and also possible problem #1 versus secondary to recent resuscitation and possible constipation -CT abdomen and pelvis with no acute intra-abdominal process. -Abdominal ultrasound unremarkable. -COVID-19 PCR negative, influenza A and B PCR negative. -Lipase within normal limits. -IV Toradol 15 mg every 6 hours as needed pain. -Oxycodone as needed. -Supportive care.  Hyperkalemia -initial a.m. reading of K7.0 (6/2) false, repeat 5.9. Given Lokelma 10 g once.  K still 5.9 today.  Pt reports she did have BM.   No associated EKG changes. Repeat Lokelma. Afternoon K level, LDH and CK  Chest pain -Likely secondary to problem #1. -Cardiology consulted - feel patient's chest pain syndrome is noncardiac with flat troponin trend.  Recent embolic CVA -Status post tPA and mechanical thrombectomy during prior hospitalization. -Eliquis has been discontinued secondary to bloody aspirate noted from pleural space with pigtail drain catheter. Resumed Eliquis after tPA (PM of 6/3)  Paroxysmal atrial fibrillation/status post TEE/ DCCV 5/9 -In NSR.   -Continue amiodarone.   -Eliquis resumed (was on hold during fibrinolysis)  QT  prolongation -Concerning as patient noted to be on amiodarone. -Cardiology consulted: QT prolongation is normal on amiodarone.  Nonischemic cardiomyopathy/chronic systolic heart failure -Norvasc, digoxin, hydralazine, Imdur. -Not on spironolactone/ACE/ARB due to history of hyperkalemia. -Not on metoprolol due to allergy. -Not on Entresto or farxiga due to orthostasis -Per cardiology.  Hypertension -Continue Norvasc.   -Hydralazine and Imdur have been uptitrated per cardiology for better blood pressure control. -Per cardiology.    Hyperlipidemia -Continue statin.    Poorly controlled insulin-dependent type 2 diabetes mellitus -Hemoglobin A1c 10.5 (05/13/2020) -Continue Lantus, SSI.  Titrate insulin for inpatient goal 140-180  Constipation -Continue MiraLAX twice daily -Placed on Senokot-S twice daily -Smog enema x1.   Patient BMI: Body mass index is 30.3 kg/m.   DVT prophylaxis: Place and maintain sequential compression device Start: 06/08/20 1841 Place and maintain sequential compression device Start: 06/07/20 1434   Diet:  Diet Orders (From admission, onward)    Start     Ordered   06/11/20 1030  Diet Heart Room service appropriate? Yes; Fluid consistency: Thin; Fluid restriction: 1500 mL Fluid  Diet effective now       Question Answer Comment  Room service appropriate? Yes   Fluid consistency: Thin   Fluid restriction: 1500 mL Fluid      06/11/20 1029            Code Status: Full Code    Subjective 06/14/20    Patient seen at bedside this morning. Asks for pain killers.  When asking about her appetite, she just goes back to asking for her pain killers.  Reports poor appetite mostly because of pain uncontrolled.  Appears comfortable however.  Disposition Plan & Communication   Status is: Inpatient  Remains inpatient appropriate because:Inpatient level of care appropriate due to severity of illness with chest tube in place and receiving fibrinolysis  with pulmonary for complicated pleural effusion.   Dispo: The patient is from: Home              Anticipated d/c is to: Home  (SNF recommended)              Patient currently is not medically stable to d/c.   Difficult to place patient No   Consults, Procedures, Significant Events   Consultants:   Pulmonology  Cardiology  Interventional radiology  Procedures:   CT chest 06/07/2020  CT abdomen and pelvis 06/06/2020  Chest x-ray 06/06/2020, 06/08/2020  Abdominal ultrasound 06/07/2020  CT-guided placement of 14 French all-purpose drain catheter into left pleural space with aspiration of 950 cc of bloody fluid--- Per Dr. Grace Isaac, IR 06/07/2020  Pleural fibrinolysis x 6 days  Antimicrobials:  Anti-infectives (From admission, onward)   Start     Dose/Rate Route Frequency Ordered Stop   06/10/20 1400  metroNIDAZOLE (FLAGYL) tablet 500 mg  Status:  Discontinued        500 mg Oral Every 8 hours 06/10/20 1142 06/12/20 1356   06/08/20 0000  vancomycin (VANCOCIN) IVPB 1000 mg/200 mL premix  Status:  Discontinued        1,000 mg 200 mL/hr over 60 Minutes Intravenous Every 24 hours 06/07/20 0647 06/09/20 0800   06/07/20 1000  metroNIDAZOLE (FLAGYL) IVPB 500 mg  Status:  Discontinued        500 mg 100 mL/hr over 60 Minutes Intravenous Every 8 hours 06/07/20 0635 06/10/20 1142   06/07/20 1000  ceFEPIme (MAXIPIME) 2 g in sodium chloride 0.9 % 100 mL IVPB  Status:  Discontinued  2 g 200 mL/hr over 30 Minutes Intravenous Every 8 hours 06/07/20 0647 06/12/20 1356   06/07/20 0115  vancomycin (VANCOCIN) IVPB 1000 mg/200 mL premix        1,000 mg 200 mL/hr over 60 Minutes Intravenous  Once 06/07/20 0114 06/07/20 0512   06/07/20 0115  ceFEPIme (MAXIPIME) 1 g in sodium chloride 0.9 % 100 mL IVPB  Status:  Discontinued        1 g 200 mL/hr over 30 Minutes Intravenous  Once 06/07/20 0114 06/07/20 0114   06/07/20 0115  metroNIDAZOLE (FLAGYL) IVPB 500 mg        500 mg 100 mL/hr over 60  Minutes Intravenous  Once 06/07/20 0114 06/07/20 0512   06/07/20 0115  ceFEPIme (MAXIPIME) 2 g in sodium chloride 0.9 % 100 mL IVPB        2 g 200 mL/hr over 30 Minutes Intravenous  Once 06/07/20 0114 06/07/20 0512        Micro    Objective   Vitals:   06/13/20 1714 06/13/20 2035 06/14/20 0342 06/14/20 1344  BP: (!) 164/64 (!) 144/61 140/64 (!) 145/60  Pulse: 69 73 72 80  Resp:  (!) 22 (!) 22 14  Temp:  98.2 F (36.8 C) 97.7 F (36.5 C) 97.7 F (36.5 C)  TempSrc:  Oral  Oral  SpO2:  94% 95% 95%  Weight:   77.6 kg   Height:        Intake/Output Summary (Last 24 hours) at 06/14/2020 1401 Last data filed at 06/14/2020 0700 Gross per 24 hour  Intake 780 ml  Output 1010 ml  Net -230 ml   Filed Weights   06/12/20 0421 06/13/20 0430 06/14/20 0342  Weight: 80 kg 78 kg 77.6 kg    Physical Exam:  General exam: awake, alert, no acute distress, sitting up in bed Respiratory system: normal respiratory effort, left-sided Pleurx catheter in place with serosanguineous fluid in chamber Cardiovascular system: RRR, no peripheral edema Central nervous system: A&O x3. no gross focal neurologic deficits, normal speech  Labs   Data Reviewed: I have personally reviewed following labs and imaging studies  CBC: Recent Labs  Lab 06/08/20 0438 06/09/20 0528 06/10/20 0431 06/11/20 0440 06/12/20 0457 06/13/20 0504 06/14/20 0535  WBC 10.8* 10.0 10.3 7.8 7.9 9.2 7.5  NEUTROABS 8.5* 7.6 7.5  --   --   --   --   HGB 9.4* 10.6* 9.8* 9.3* 10.0* 9.0* 9.7*  HCT 31.3* 35.7* 32.8* 30.9* 32.6* 28.8* 30.8*  MCV 83.2 84.2 82.6 82.0 80.5 79.6* 78.0*  PLT 556* 586* 557* 589* 529* 579* 524*   Basic Metabolic Panel: Recent Labs  Lab 06/08/20 0438 06/09/20 0528 06/10/20 0431 06/11/20 0440 06/12/20 0457 06/12/20 0725 06/12/20 1029 06/13/20 0504 06/13/20 1720 06/14/20 0535  NA 140 137 137 136 135 133*  --  134*  --  133*  K 4.5 5.0 5.1 5.0 7.0* 5.9*  --  5.9* 5.7* 5.1  CL 104 102 103 105  106 102  --  102  --  101  CO2 32 30 31 30 23 28   --  26  --  26  GLUCOSE 78 170* 134* 158* 102* 111*  --  128*  --  66*  BUN 26* 27* 26* 26* 21 19  --  20  --  16  CREATININE 0.74 0.76 0.78 0.76 0.80 0.68  --  0.79  --  0.57  CALCIUM 8.8* 8.6* 8.4* 8.3* 8.6* 8.4*  --  8.7*  --  8.6*  MG 2.1 1.9 1.9  --   --   --  1.7  --   --   --    GFR: Estimated Creatinine Clearance: 66.4 mL/min (by C-G formula based on SCr of 0.57 mg/dL). Liver Function Tests: Recent Labs  Lab 06/08/20 0438  AST 39  ALT 30  ALKPHOS 156*  BILITOT 0.7  PROT 7.4  7.3  ALBUMIN 2.3*   No results for input(s): LIPASE, AMYLASE in the last 168 hours. No results for input(s): AMMONIA in the last 168 hours. Coagulation Profile: Recent Labs  Lab 06/07/20 1533  INR 1.8*   Cardiac Enzymes: Recent Labs  Lab 06/13/20 1720  CKTOTAL 255*   BNP (last 3 results) No results for input(s): PROBNP in the last 8760 hours. HbA1C: No results for input(s): HGBA1C in the last 72 hours. CBG: Recent Labs  Lab 06/13/20 1137 06/13/20 1718 06/13/20 2027 06/14/20 0726 06/14/20 1126  GLUCAP 128* 113* 81 57* 131*   Lipid Profile: No results for input(s): CHOL, HDL, LDLCALC, TRIG, CHOLHDL, LDLDIRECT in the last 72 hours. Thyroid Function Tests: No results for input(s): TSH, T4TOTAL, FREET4, T3FREE, THYROIDAB in the last 72 hours. Anemia Panel: No results for input(s): VITAMINB12, FOLATE, FERRITIN, TIBC, IRON, RETICCTPCT in the last 72 hours. Sepsis Labs: No results for input(s): PROCALCITON, LATICACIDVEN in the last 168 hours.  Recent Results (from the past 240 hour(s))  Culture, blood (routine x 2)     Status: None   Collection Time: 06/07/20  2:10 AM   Specimen: BLOOD  Result Value Ref Range Status   Specimen Description   Final    BLOOD LEFT ANTECUBITAL Performed at [facility], [address] [place], [place] [address]    Special Requests   Final    BOTTLES DRAWN AEROBIC AND ANAEROBIC  Blood Culture adequate volume Performed at [facility], [address], [place], [place] [address]    Culture   Final    NO GROWTH 5 DAYS Performed at [facility], [address], [place], [place] [address]    Report Status 06/12/2020 FINAL  Final  Culture, blood (Routine X 2) w Reflex to ID Panel     Status: None   Collection Time: 06/07/20  2:15 AM   Specimen: BLOOD  Result Value Ref Range Status   Specimen Description   Final    BLOOD BLOOD RIGHT HAND Performed at [facility], [address] [place], [place] [address]    Special Requests   Final    BOTTLES DRAWN AEROBIC ONLY Blood Culture adequate volume Performed at [facility], [address], [place], [place] [address]    Culture   Final    NO GROWTH 5 DAYS Performed at [facility], [address], [place], [place] [address]    Report Status 06/12/2020 FINAL  Final  Resp Panel by RT-PCR (Flu A&B, Covid) Nasopharyngeal Swab     Status: None   Collection Time: 06/07/20  2:26 AM   Specimen: Nasopharyngeal Swab; Nasopharyngeal(NP) swabs in vial transport medium  Result Value Ref Range Status   SARS Coronavirus 2 by RT PCR NEGATIVE NEGATIVE Final    Comment: (NOTE) SARS-CoV-2 target nucleic acids are NOT DETECTED.  The SARS-CoV-2 RNA is generally detectable in upper respiratory specimens during the acute phase of infection. The lowest concentration of SARS-CoV-2 viral copies this assay can detect is 138 copies/mL. A negative result does not preclude SARS-Cov-2 infection  and should not be used as the sole basis for treatment or other patient management decisions. A negative result may occur with  improper specimen collection/handling, submission of specimen other than nasopharyngeal swab, presence of viral mutation(s) within the areas targeted by this assay, and inadequate number of viral copies(<138 copies/mL). A negative result must be combined  with clinical observations, patient history, and epidemiological information. The expected result is Negative.  Fact Sheet for Patients:  BloggerCourse.com  Fact Sheet for Healthcare Providers:  SeriousBroker.it  This test is no t yet approved or cleared by the [place] FDA and  has been authorized for detection and/or diagnosis of SARS-CoV-2 by FDA under an Emergency Use Authorization (EUA). This EUA will remain  in effect (meaning this test can be used) for the duration of the COVID-19 declaration under Section 564(b)(1) of the Act, 21 U.S.C.section 360bbb-3(b)(1), unless the authorization is terminated  or revoked sooner.       Influenza A by PCR NEGATIVE NEGATIVE Final   Influenza B by PCR NEGATIVE NEGATIVE Final    Comment: (NOTE) The Xpert Xpress SARS-CoV-2/FLU/RSV plus assay is intended as an aid in the diagnosis of influenza from Nasopharyngeal swab specimens and should not be used as a sole basis for treatment. Nasal washings and aspirates are unacceptable for Xpert Xpress SARS-CoV-2/FLU/RSV testing.  Fact Sheet for Patients: BloggerCourse.com  Fact Sheet for Healthcare Providers: SeriousBroker.it  This test is not yet approved or cleared by the [place] FDA and has been authorized for detection and/or diagnosis of SARS-CoV-2 by FDA under an Emergency Use Authorization (EUA). This EUA will remain in effect (meaning this test can be used) for the duration of the COVID-19 declaration under Section 564(b)(1) of the Act, 21 U.S.C. section 360bbb-3(b)(1), unless the authorization is terminated or revoked.  Performed at [facility], [address] [place], [place] [address]   Culture, Urine     Status: Abnormal   Collection Time: 06/07/20  6:38 AM   Specimen: Urine, Random  Result Value Ref Range Status   Specimen Description   Final     URINE, RANDOM Performed at [facility], [address], [place], [place] [address]    Special Requests   Final    NONE Performed at [facility], [address] [place], [place] [address]    Culture MULTIPLE SPECIES PRESENT, SUGGEST RECOLLECTION (A)  Final   Report Status 06/08/2020 FINAL  Final  Fungus Culture With Stain     Status: None (Preliminary result)   Collection Time: 06/07/20 12:44 PM   Specimen: Pleural Fluid  Result Value Ref Range Status   Fungus Stain Final report  Final    Comment: (NOTE) Performed At: [facility] [address] [place], [place] [id number] Jolene Schimke MD [id number]    Fungus (Mycology) Culture PENDING  Incomplete   Fungal Source PLEURAL  Final    Comment: LEFT Performed at [facility], [address] [place], [place] [address]   Body fluid culture w Gram Stain     Status: None   Collection Time: 06/07/20 12:44 PM   Specimen: Pleura  Result Value Ref Range Status   Specimen Description   Final    PLEURAL LEFT Performed at [facility], [address], [place], [place] [address]    Special Requests   Final    NONE Performed at [facility], [address], [place], [place] [address]    Gram  Stain   Final    RARE WBC PRESENT,BOTH PMN AND MONONUCLEAR NO ORGANISMS SEEN    Culture   Final    NO GROWTH 3 DAYS Performed at [facility], [address], [place], [place] [address]    Report Status 06/10/2020 FINAL  Final  Fungus Culture Result     Status: None   Collection Time: 06/07/20 12:44 PM  Result Value Ref Range Status   Result 1 Comment  Final    Comment: (NOTE) KOH/Calcofluor preparation:  no fungus observed. Performed At: [facility] [address] [place], [place] [id number] Jolene Schimke MD FB:[id number]   MRSA PCR Screening     Status: None   Collection Time: 06/09/20  8:12 AM   Specimen: Nasal  Mucosa; Nasopharyngeal  Result Value Ref Range Status   MRSA by PCR NEGATIVE NEGATIVE Final    Comment:        The GeneXpert MRSA Assay (FDA approved for NASAL specimens only), is one component of a comprehensive MRSA colonization surveillance program. It is not intended to diagnose MRSA infection nor to guide or monitor treatment for MRSA infections. Performed at [facility], [address], [place], [place] [address]       Imaging Studies   DG CHEST PORT 1 VIEW  Result Date: 06/13/2020 CLINICAL DATA:  Pleural effusion EXAM: PORTABLE CHEST 1 VIEW COMPARISON:  06/11/2020 FINDINGS: Pigtail pleural catheter on the left is unchanged in the left lung base. Pleural thickening on the left and left lower lobe airspace disease also unchanged. No pneumothorax. Right lung is clear without infiltrate or effusion. IMPRESSION: No interval change left pleural thickening and left lower lobe airspace disease. Electronically Signed   By: Marlan Palau M.D.   On: 06/13/2020 08:00     Medications   Scheduled Meds: . amiodarone  200 mg Oral Daily  . apixaban  5 mg Oral BID  . atorvastatin  80 mg Oral Daily  . ferrous gluconate  324 mg Oral Daily  . insulin aspart  0-5 Units Subcutaneous QHS  . insulin aspart  0-9 Units Subcutaneous TID WC  . insulin glargine  17 Units Subcutaneous Daily  . isosorbide-hydrALAZINE  2 tablet Oral TID  . magnesium oxide  400 mg Oral Daily  . polyethylene glycol  17 g Oral BID  . scopolamine  1 patch Transdermal Q72H  . senna-docusate  1 tablet Oral BID  . sodium zirconium cyclosilicate  10 g Oral Daily   Continuous Infusions: . sodium chloride Stopped (06/12/20 0350)       LOS: 7 days    Time spent: 20 minutes    Pennie Banter, DO [facility]  06/14/2020, 2:01 PM      If 7PM-7AM, please contact night-coverage. How to contact the [facility] Attending or Consulting provider 7A - 7P or covering provider during after hours 7P  -7A, for this patient?    1. Check the care team in [facility] and look for a) attending/consulting [facility] provider listed and b) the [facility] team listed 2. Log into [email or web address] and use [place]'s universal password to access. If you do not have the password, please contact the hospital operator. 3. Locate the [facility] provider you are looking for under [facility] and page to a number that you can be directly reached. 4. If you still have difficulty reaching the provider, please page the [facility] (Director on Call) for the Hospitalists listed on amion for assistance.

## 2020-06-15 ENCOUNTER — Inpatient Hospital Stay (HOSPITAL_COMMUNITY): Payer: Medicare Other

## 2020-06-15 DIAGNOSIS — J90 Pleural effusion, not elsewhere classified: Secondary | ICD-10-CM | POA: Diagnosis not present

## 2020-06-15 LAB — BASIC METABOLIC PANEL
Anion gap: 5 (ref 5–15)
BUN: 12 mg/dL (ref 8–23)
CO2: 30 mmol/L (ref 22–32)
Calcium: 8.4 mg/dL — ABNORMAL LOW (ref 8.9–10.3)
Chloride: 101 mmol/L (ref 98–111)
Creatinine, Ser: 0.74 mg/dL (ref 0.44–1.00)
GFR, Estimated: >60 mL/min (ref >60)
Glucose, Bld: 94 mg/dL (ref 70–99)
Potassium: 4.8 mmol/L (ref 3.5–5.1)
Sodium: 136 mmol/L (ref 135–145)

## 2020-06-15 LAB — GLUCOSE, CAPILLARY
Glucose-Capillary: 106 mg/dL — ABNORMAL HIGH (ref 70–99)
Glucose-Capillary: 199 mg/dL — ABNORMAL HIGH (ref 70–99)
Glucose-Capillary: 320 mg/dL — ABNORMAL HIGH (ref 70–99)
Glucose-Capillary: 225 mg/dL — ABNORMAL HIGH (ref 70–99)

## 2020-06-15 NOTE — Plan of Care (Signed)
  Problem: Clinical Measurements: Goal: Respiratory complications will improve Outcome: Progressing   Problem: Nutrition: Goal: Adequate nutrition will be maintained Outcome: Progressing   Problem: Coping: Goal: Level of anxiety will decrease Outcome: Progressing   Problem: Pain Managment: Goal: General experience of comfort will improve Outcome: Progressing   

## 2020-06-15 NOTE — Progress Notes (Signed)
NAME:  [name] [name], MRN:  [id number], DOB:  22-Oct-1950, LOS: 8 ADMISSION DATE:  06/06/2020, CONSULTATION DATE:  06/07/20 REFERRING MD:  Ramiro Harvest, MD CHIEF COMPLAINT:  Pleural effusion  History of Present Illness:  [name] [name] is a 70 year old woman with CHF, paroxysmal atrial fibrillation, and DMII who was recently admitted 5/2 to 5/16 for acute embolic stroke due to PAG s/p TPA therapy and mechanical thrombectomy. Her course was complicated by cardiac arrest and cardiogenic shock. She required milrinone drip and cardioversion for the atrial fibrillation. She has been eliquis for anticoagulation after the stroke. She was sent to [facility] on 5/16 where she presented from with right sided abdominal pain, nausea and vomiting. She also complained of some shortness of breath and has been on 3L of O2 since being at [facility].   Work up in the ED revealed left hemithorax opacification based on chest radiograph and CT chest showed loculated pleural effusion involving the left hemithorax.   IR was consulted for chest tube placement and a 80fr pigtail catheter was placed via CT guidance with of bloody appearing fluid removed initially.   PCCM was consulted for further evaluation and treatment of the left pleural effusion.   Pleural fluid studies show LDH 1636, total protein 3.1, glucose 134, albumin 1.5. Cell count is pending at this time.   Pertinent  Medical History  Atrial Fibrillation Heart Failure DMII  Significant Hospital Events: Including procedures, antibiotic start and stop dates in addition to other pertinent events   . 5/28 admitted to [facility], 8fr chest tube placed by IR . 5/29 1st dose of TPA/DNAse . 5/30 2nd dose of TPA/DNAse . 5/31 3rd dose of TPA/DNAse . 6/1 4th dose of TPA/DNAse . 6/2 5th dose of TPA/DNAse . 6/3 6th dose of TAP/DNAse  . Pull  L chest tube  Interim History / Subjective:  Feeling better, no sob/ no pain with inspiration      Objective    Blood pressure (!) 151/68, pulse 79, temperature 97.7 F (36.5 C), temperature source Oral, resp. rate 20, height 5\' 3"  (1.6 m), weight 79.2 kg, SpO2 97 %.        Intake/Output Summary (Last 24 hours) at 06/15/2020 1239 Last data filed at 06/15/2020 1041 Gross per 24 hour  Intake 546.67 ml  Output 620 ml  Net -73.33 ml   Filed Weights   06/13/20 0430 06/14/20 0342 06/15/20 0500  Weight: 78 kg 77.6 kg 79.2 kg    Examination:   tmax  98  General appearance:     Elderly bf sitting up at 60 degrees/ nad  At Rest 02 sats  98% on 2lpm   No jvd Oropharynx clear,  mucosa nl Neck supple Lungs with decreased bs on L , no fluctuation with cough < 50 cc / 24 h drainage  RRR no s3 or or sign murmur Abd soft/nl  excursion - po intake Extr warm with no edema or clubbing noted Neuro  Sensorium alert ,  no apparent motor deficits          Assessment & Plan:   Left complicated pleural effusion Based on review of chest imaging the pleural effusion is new since 01/2020 and first noted on chest radiograph 05/13/20.  Pleural fluid studies indicatedexudative process based on LDH ratio. T  - Cytology showed lymphocytosis - TPA/DNAse therapy initiated 5/29 >>completed a total of 6 treatments.  6/3 = final  instillation. - Safe to continue anticoag - pull L ct  pm6/5           Labs   CBC: Recent Labs  Lab 06/09/20 0528 06/10/20 0431 06/11/20 0440 06/12/20 0457 06/13/20 0504 06/14/20 0535  WBC 10.0 10.3 7.8 7.9 9.2 7.5  NEUTROABS 7.6 7.5  --   --   --   --   HGB 10.6* 9.8* 9.3* 10.0* 9.0* 9.7*  HCT 35.7* 32.8* 30.9* 32.6* 28.8* 30.8*  MCV 84.2 82.6 82.0 80.5 79.6* 78.0*  PLT 586* 557* 589* 529* 579* 524*    Basic Metabolic Panel: Recent Labs  Lab 06/09/20 0528 06/10/20 0431 06/11/20 0440 06/12/20 0457 06/12/20 0725 06/12/20 1029 06/13/20 0504 06/13/20 1720 06/14/20 0535 06/15/20 0506  NA 137 137   < > 135 133*  --  134*  --  133* 136  K 5.0 5.1   < > 7.0* 5.9*  --  5.9*  5.7* 5.1 4.8  CL 102 103   < > 106 102  --  102  --  101 101  CO2 30 31   < > 23 28  --  26  --  26 30  GLUCOSE 170* 134*   < > 102* 111*  --  128*  --  66* 94  BUN 27* 26*   < > 21 19  --  20  --  16 12  CREATININE 0.76 0.78   < > 0.80 0.68  --  0.79  --  0.57 0.74  CALCIUM 8.6* 8.4*   < > 8.6* 8.4*  --  8.7*  --  8.6* 8.4*  MG 1.9 1.9  --   --   --  1.7  --   --   --   --    < > = values in this interval not displayed.   GFR: Estimated Creatinine Clearance: 67 mL/min (by C-G formula based on SCr of 0.74 mg/dL). Recent Labs  Lab 06/11/20 0440 06/12/20 0457 06/13/20 0504 06/14/20 0535  WBC 7.8 7.9 9.2 7.5    Liver Function Tests: No results for input(s): AST, ALT, ALKPHOS, BILITOT, PROT, ALBUMIN in the last 168 hours. No results for input(s): LIPASE, AMYLASE in the last 168 hours. No results for input(s): AMMONIA in the last 168 hours.  ABG    Component Value Date/Time   PHART 7.401 05/12/2020 1602   PCO2ART 37.6 05/12/2020 1602   PO2ART 349 (H) 05/12/2020 1602   HCO3 23.5 05/12/2020 1602   TCO2 25 05/12/2020 1602   ACIDBASEDEF 1.0 05/12/2020 1602   O2SAT 73.7 05/20/2020 0520     Coagulation Profile: No results for input(s): INR, PROTIME in the last 168 hours.  Cardiac Enzymes: Recent Labs  Lab 06/13/20 1720  CKTOTAL 255*    HbA1C: HbA1c, POC (controlled diabetic range)  Date/Time Value Ref Range Status  12/30/2017 10:02 AM 9.4 (A) 0.0 - 7.0 % Final  09/06/2017 01:31 PM 8.8 (A) 0.0 - 7.0 % Final   HbA1c POC (<> result, manual entry)  Date/Time Value Ref Range Status  07/26/2019 11:08 AM >15.0 4.0 - 5.6 % Final   Hgb A1c MFr Bld  Date/Time Value Ref Range Status  05/13/2020 05:27 AM 10.5 (H) 4.8 - 5.6 % Final    Comment:    (NOTE) Pre diabetes:          5.7%-6.4%  Diabetes:              >6.4%  Glycemic control for   <7.0% adults with diabetes   02/04/2020 06:57 AM 10.4 (H) 4.8 -  5.6 % Final    Comment:    (NOTE) Pre diabetes:           5.7%-6.4%  Diabetes:              >6.4%  Glycemic control for   <7.0% adults with diabetes     CBG: Recent Labs  Lab 06/14/20 1126 06/14/20 1701 06/14/20 2058 06/15/20 0803 06/15/20 1208  GLUCAP 131* 303* 161* 106* 199*     Sandrea Hughs, MD Pulmonary and Critical Care Medicine [facility] Cell [phone]   After 7:00 pm call Elink  [phone]

## 2020-06-15 NOTE — Progress Notes (Signed)
PROGRESS NOTE    [name] [name]   ZNB:[id number]  DOB: 07/21/1950  PCP: Dana Allan, MD    DOA: 06/06/2020 LOS: 8   Brief Narrative   Patient 70 year old female history of CHF(EF<20%), paroxysmal A. fib on chronic anticoagulation, hypertension, hyperlipidemia, IDDM type II, bipolar disorder recently hospitalized for acute embolic stroke requiring tPA and mechanical thrombectomy with hospital course complicated by cardiogenic shock/PEA cardiac arrest immediately following thrombectomy.  During that hospitalization patient required milrinone infusion and diuretics.  Patient also required vasopressors was extubated and weaned off pressors on 05/13/2020.  Patient status post TEE/DCCV on 5/9 and discharged to [facility] 5/16.  Patient presented to the ED from SNF with right-sided abdominal pain, nausea vomiting, shortness of breath with speaking.  Patient noted to have a leukocytosis with a white count of 12.9, hemoglobin of 9.3, platelet count of 493.  Patient pancultured.  Chest x-ray showed complete opacification of the left hemithorax.  CT chest concerning for large complex multiloculated left pleural effusion increased in size since prior CT of 05/12/2020 with differential concerning for empyema versus sequelae of previous hemothorax versus neoplasm.  IR consulted and pigtail catheter drain placed with 950 cc of bloody fluid aspirated.  PCCM consulted and patient with fibrinolysis 06/08/2020.  Cardiology also consulted for management of chronic complicated cardiac history.   5/28 admitted to [facility], 55fr chest tube placed by IR  TPA/DNAse fibrinolysis 5/29, 5/30, 5/31, 6/1    Assessment & Plan   Principal Problem:   Pleural effusion Active Problems:   HTN (hypertension)   Mixed hyperlipidemia   Uncontrolled type 2 diabetes mellitus with hyperglycemia (HCC)   A-fib (HCC)   Pleural effusion on left   Right upper quadrant abdominal pain   Chest pain   QT prolongation   Large left  multiloculated exudative pleural effusion Pt presented with abdominal pain/chest pain, shortness of breath with speaking. -PCCM/pulmonary following - see their recs -Exudative pleural effusion by lights criteria. -CT chest with large complex multilocular left pleural effusion increased in size since recent CT of the neck 05/12/2020. -IR consulted and pigtail drain catheter placed with 950 cc of bloody aspirate noted. -status post tPA/DNase on 5/29, 30, 31, 6/1, 6/2, 6/3 -Resumed on Eliquis 6/3 PM -Completed antibiotics 6/1 (treated w/Cefepime, Flagyl) -PCCM may remove chest tube later today  Right-sided abdominal pain/nausea and vomiting -Likely secondary to rib fractures and also possible problem #1 versus secondary to recent resuscitation and possible constipation -CT abdomen and pelvis with no acute intra-abdominal process. -Abdominal ultrasound unremarkable. -COVID-19 PCR negative, influenza A and B PCR negative. -Lipase within normal limits. -IV Toradol 15 mg every 6 hours as needed pain. -Oxycodone as needed. -Supportive care.  Hyperkalemia -initial a.m. reading of K7.0 (6/2) false, repeat 5.9. Given Lokelma 10 g once.  K still 5.9. Repeated Loklema - 6/4 6/5 potassium 5.1  No associated EKG changes. Stop Lokelma and repeat BMP in AM  Chest pain -Likely secondary to problem #1. -Cardiology consulted - feel patient's chest pain syndrome is noncardiac with flat troponin trend.  Recent embolic CVA -Status post tPA and mechanical thrombectomy during prior hospitalization. -Eliquis was held due to primary problem, bloody pleural fluid Resumed Eliquis 6/32 PM (tPA treatments completed)  Paroxysmal atrial fibrillation/status post TEE/ DCCV 5/9 -In NSR.   -Continue amiodarone.   -Eliquis resumed (was on hold during fibrinolysis)  QT prolongation -Concerning as patient noted to be on amiodarone. -Cardiology consulted: QT prolongation is normal on amiodarone.  Nonischemic  cardiomyopathy/chronic systolic  heart failure -Norvasc, digoxin, hydralazine, Imdur. -Not on spironolactone/ACE/ARB due to history of hyperkalemia. -Not on metoprolol due to allergy. -Not on Entresto or farxiga due to orthostasis -Per cardiology.  Hypertension -Continue Norvasc.   -Hydralazine and Imdur have been uptitrated per cardiology for better blood pressure control. -Per cardiology.    Hyperlipidemia -Continue statin.    Poorly controlled insulin-dependent type 2 diabetes mellitus -Hemoglobin A1c 10.5 (05/13/2020) -Continue Lantus, SSI.  Titrate insulin for inpatient goal 140-180  Constipation -Continue MiraLAX twice daily -Placed on Senokot-S twice daily -Smog enema x1.   Patient BMI: Body mass index is 30.93 kg/m.   DVT prophylaxis: Place and maintain sequential compression device Start: 06/08/20 1841 Place and maintain sequential compression device Start: 06/07/20 1434   Diet:  Diet Orders (From admission, onward)    Start     Ordered   06/11/20 1030  Diet Heart Room service appropriate? Yes; Fluid consistency: Thin; Fluid restriction: 1500 mL Fluid  Diet effective now       Question Answer Comment  Room service appropriate? Yes   Fluid consistency: Thin   Fluid restriction: 1500 mL Fluid      06/11/20 1029            Code Status: Full Code    Subjective 06/15/20    Patient seen at bedside this morning. She was eating breakfast and reports doing better with eating more.  Reports 8 out of 10 pain from chest tube and her back.  Otherwise denies any complaints or issues.  Disposition Plan & Communication   Status is: Inpatient  Remains inpatient appropriate because:Inpatient level of care appropriate due to severity of illness with chest tube in place and receiving fibrinolysis with pulmonary for complicated pleural effusion.   Dispo: The patient is from: Home              Anticipated d/c is to: Home  (SNF recommended)              Patient  currently is not medically stable to d/c.   Difficult to place patient No   Consults, Procedures, Significant Events   Consultants:   Pulmonology  Cardiology  Interventional radiology  Procedures:   CT chest 06/07/2020  CT abdomen and pelvis 06/06/2020  Chest x-ray 06/06/2020, 06/08/2020  Abdominal ultrasound 06/07/2020  CT-guided placement of 14 French all-purpose drain catheter into left pleural space with aspiration of 950 cc of bloody fluid--- Per Dr. Grace Isaac, IR 06/07/2020  Pleural fibrinolysis x 6 days  Antimicrobials:  Anti-infectives (From admission, onward)   Start     Dose/Rate Route Frequency Ordered Stop   06/10/20 1400  metroNIDAZOLE (FLAGYL) tablet 500 mg  Status:  Discontinued        500 mg Oral Every 8 hours 06/10/20 1142 06/12/20 1356   06/08/20 0000  vancomycin (VANCOCIN) IVPB 1000 mg/200 mL premix  Status:  Discontinued        1,000 mg 200 mL/hr over 60 Minutes Intravenous Every 24 hours 06/07/20 0647 06/09/20 0800   06/07/20 1000  metroNIDAZOLE (FLAGYL) IVPB 500 mg  Status:  Discontinued        500 mg 100 mL/hr over 60 Minutes Intravenous Every 8 hours 06/07/20 0635 06/10/20 1142   06/07/20 1000  ceFEPIme (MAXIPIME) 2 g in sodium chloride 0.9 % 100 mL IVPB  Status:  Discontinued        2 g 200 mL/hr over 30 Minutes Intravenous Every 8 hours 06/07/20 0647 06/12/20 1356   06/07/20 0115  vancomycin (VANCOCIN) IVPB 1000 mg/200 mL premix        1,000 mg 200 mL/hr over 60 Minutes Intravenous  Once 06/07/20 0114 06/07/20 0512   06/07/20 0115  ceFEPIme (MAXIPIME) 1 g in sodium chloride 0.9 % 100 mL IVPB  Status:  Discontinued        1 g 200 mL/hr over 30 Minutes Intravenous  Once 06/07/20 0114 06/07/20 0114   06/07/20 0115  metroNIDAZOLE (FLAGYL) IVPB 500 mg        500 mg 100 mL/hr over 60 Minutes Intravenous  Once 06/07/20 0114 06/07/20 0512   06/07/20 0115  ceFEPIme (MAXIPIME) 2 g in sodium chloride 0.9 % 100 mL IVPB        2 g 200 mL/hr over 30 Minutes  Intravenous  Once 06/07/20 0114 06/07/20 0512        Micro    Objective   Vitals:   06/14/20 2030 06/15/20 0500 06/15/20 0502 06/15/20 1212  BP: (!) 154/65  (!) 153/66 (!) 151/68  Pulse: 72  75 79  Resp: 18  18 20   Temp: 98 F (36.7 C)  97.7 F (36.5 C) 97.7 F (36.5 C)  TempSrc: Oral  Oral Oral  SpO2: 98%  98% 97%  Weight:  79.2 kg    Height:        Intake/Output Summary (Last 24 hours) at 06/15/2020 1359 Last data filed at 06/15/2020 1041 Gross per 24 hour  Intake 306.67 ml  Output 620 ml  Net -313.33 ml   Filed Weights   06/13/20 0430 06/14/20 0342 06/15/20 0500  Weight: 78 kg 77.6 kg 79.2 kg    Physical Exam:  General exam: awake, alert, no acute distress, sitting up in bed eating breakfast Respiratory system: CTAB, normal respiratory effort, left-sided Pleurx catheter in place with serosanguineous fluid in chamber Cardiovascular system: S1/S2 normal, RRR, no peripheral edema Central nervous system: A&O x3. Grossly non-focal exam, normal speech Gastroenterology: soft, non-tender  Labs   Data Reviewed: I have personally reviewed following labs and imaging studies  CBC: Recent Labs  Lab 06/09/20 0528 06/10/20 0431 06/11/20 0440 06/12/20 0457 06/13/20 0504 06/14/20 0535  WBC 10.0 10.3 7.8 7.9 9.2 7.5  NEUTROABS 7.6 7.5  --   --   --   --   HGB 10.6* 9.8* 9.3* 10.0* 9.0* 9.7*  HCT 35.7* 32.8* 30.9* 32.6* 28.8* 30.8*  MCV 84.2 82.6 82.0 80.5 79.6* 78.0*  PLT 586* 557* 589* 529* 579* 524*   Basic Metabolic Panel: Recent Labs  Lab 06/09/20 0528 06/10/20 0431 06/11/20 0440 06/12/20 0457 06/12/20 0725 06/12/20 1029 06/13/20 0504 06/13/20 1720 06/14/20 0535 06/15/20 0506  NA 137 137   < > 135 133*  --  134*  --  133* 136  K 5.0 5.1   < > 7.0* 5.9*  --  5.9* 5.7* 5.1 4.8  CL 102 103   < > 106 102  --  102  --  101 101  CO2 30 31   < > 23 28  --  26  --  26 30  GLUCOSE 170* 134*   < > 102* 111*  --  128*  --  66* 94  BUN 27* 26*   < > 21 19  --   20  --  16 12  CREATININE 0.76 0.78   < > 0.80 0.68  --  0.79  --  0.57 0.74  CALCIUM 8.6* 8.4*   < > 8.6* 8.4*  --  8.7*  --  8.6* 8.4*  MG 1.9 1.9  --   --   --  1.7  --   --   --   --    < > = values in this interval not displayed.   GFR: Estimated Creatinine Clearance: 67 mL/min (by C-G formula based on SCr of 0.74 mg/dL). Liver Function Tests: No results for input(s): AST, ALT, ALKPHOS, BILITOT, PROT, ALBUMIN in the last 168 hours. No results for input(s): LIPASE, AMYLASE in the last 168 hours. No results for input(s): AMMONIA in the last 168 hours. Coagulation Profile: No results for input(s): INR, PROTIME in the last 168 hours. Cardiac Enzymes: Recent Labs  Lab 06/13/20 1720  CKTOTAL 255*   BNP (last 3 results) No results for input(s): PROBNP in the last 8760 hours. HbA1C: No results for input(s): HGBA1C in the last 72 hours. CBG: Recent Labs  Lab 06/14/20 1126 06/14/20 1701 06/14/20 2058 06/15/20 0803 06/15/20 1208  GLUCAP 131* 303* 161* 106* 199*   Lipid Profile: No results for input(s): CHOL, HDL, LDLCALC, TRIG, CHOLHDL, LDLDIRECT in the last 72 hours. Thyroid Function Tests: No results for input(s): TSH, T4TOTAL, FREET4, T3FREE, THYROIDAB in the last 72 hours. Anemia Panel: No results for input(s): VITAMINB12, FOLATE, FERRITIN, TIBC, IRON, RETICCTPCT in the last 72 hours. Sepsis Labs: No results for input(s): PROCALCITON, LATICACIDVEN in the last 168 hours.  Recent Results (from the past 240 hour(s))  Culture, blood (routine x 2)     Status: None   Collection Time: 06/07/20  2:10 AM   Specimen: BLOOD  Result Value Ref Range Status   Specimen Description   Final    BLOOD LEFT ANTECUBITAL Performed at [facility], [address] [place], [place] [address]    Special Requests   Final    BOTTLES DRAWN AEROBIC AND ANAEROBIC Blood Culture adequate volume Performed at [facility], [address] [place], [place]  [address]    Culture   Final    NO GROWTH 5 DAYS Performed at [facility], [address], [place], [place] [address]    Report Status 06/12/2020 FINAL  Final  Culture, blood (Routine X 2) w Reflex to ID Panel     Status: None   Collection Time: 06/07/20  2:15 AM   Specimen: BLOOD  Result Value Ref Range Status   Specimen Description   Final    BLOOD BLOOD RIGHT HAND Performed at [facility], [address] [place], [place] [address]    Special Requests   Final    BOTTLES DRAWN AEROBIC ONLY Blood Culture adequate volume Performed at [facility], [address] [place], [place] [address]    Culture   Final    NO GROWTH 5 DAYS Performed at [facility], [address] [place], [place] [address]    Report Status 06/12/2020 FINAL  Final  Resp Panel by RT-PCR (Flu A&B, Covid) Nasopharyngeal Swab     Status: None   Collection Time: 06/07/20  2:26 AM   Specimen: Nasopharyngeal Swab; Nasopharyngeal(NP) swabs in vial transport medium  Result Value Ref Range Status   SARS Coronavirus 2 by RT PCR NEGATIVE NEGATIVE Final    Comment: (NOTE) SARS-CoV-2 target nucleic acids are NOT DETECTED.  The SARS-CoV-2 RNA is generally detectable in upper respiratory specimens during the acute phase of infection. The lowest concentration of SARS-CoV-2 viral copies this assay can detect is 138 copies/mL. A negative result does not preclude SARS-Cov-2  infection and should not be used as the sole basis for treatment or other patient management decisions. A negative result may occur with  improper specimen collection/handling, submission of specimen other than nasopharyngeal swab, presence of viral mutation(s) within the areas targeted by this assay, and inadequate number of viral copies(<138 copies/mL). A negative result must be combined with clinical observations, patient history, and epidemiological information. The expected result is  Negative.  Fact Sheet for Patients:  BloggerCourse.com  Fact Sheet for Healthcare Providers:  SeriousBroker.it  This test is no t yet approved or cleared by the [place] FDA and  has been authorized for detection and/or diagnosis of SARS-CoV-2 by FDA under an Emergency Use Authorization (EUA). This EUA will remain  in effect (meaning this test can be used) for the duration of the COVID-19 declaration under Section 564(b)(1) of the Act, 21 U.S.C.section 360bbb-3(b)(1), unless the authorization is terminated  or revoked sooner.       Influenza A by PCR NEGATIVE NEGATIVE Final   Influenza B by PCR NEGATIVE NEGATIVE Final    Comment: (NOTE) The Xpert Xpress SARS-CoV-2/FLU/RSV plus assay is intended as an aid in the diagnosis of influenza from Nasopharyngeal swab specimens and should not be used as a sole basis for treatment. Nasal washings and aspirates are unacceptable for Xpert Xpress SARS-CoV-2/FLU/RSV testing.  Fact Sheet for Patients: BloggerCourse.com  Fact Sheet for Healthcare Providers: SeriousBroker.it  This test is not yet approved or cleared by the [place] FDA and has been authorized for detection and/or diagnosis of SARS-CoV-2 by FDA under an Emergency Use Authorization (EUA). This EUA will remain in effect (meaning this test can be used) for the duration of the COVID-19 declaration under Section 564(b)(1) of the Act, 21 U.S.C. section 360bbb-3(b)(1), unless the authorization is terminated or revoked.  Performed at [facility], [address] [place], [place] [address]   Culture, Urine     Status: Abnormal   Collection Time: 06/07/20  6:38 AM   Specimen: Urine, Random  Result Value Ref Range Status   Specimen Description   Final    URINE, RANDOM Performed at [facility], [address], [place], [place]  [address]    Special Requests   Final    NONE Performed at [facility], [address] [place], [place] [address]    Culture MULTIPLE SPECIES PRESENT, SUGGEST RECOLLECTION (A)  Final   Report Status 06/08/2020 FINAL  Final  Fungus Culture With Stain     Status: None (Preliminary result)   Collection Time: 06/07/20 12:44 PM   Specimen: Pleural Fluid  Result Value Ref Range Status   Fungus Stain Final report  Final    Comment: (NOTE) Performed At: [facility] [address] [place], [place] [id number] Jolene Schimke MD EU:[id number]    Fungus (Mycology) Culture PENDING  Incomplete   Fungal Source PLEURAL  Final    Comment: LEFT Performed at [facility], [address], [place], [place] [address]   Body fluid culture w Gram Stain     Status: None   Collection Time: 06/07/20 12:44 PM   Specimen: Pleura  Result Value Ref Range Status   Specimen Description   Final    PLEURAL LEFT Performed at [facility], [address] [place], [place] [address]    Special Requests   Final    NONE Performed at [facility], [address], [place], [place] [address]  Gram Stain   Final    RARE WBC PRESENT,BOTH PMN AND MONONUCLEAR NO ORGANISMS SEEN    Culture   Final    NO GROWTH 3 DAYS Performed at [facility], [address], [place], [place] [address]    Report Status 06/10/2020 FINAL  Final  Fungus Culture Result     Status: None   Collection Time: 06/07/20 12:44 PM  Result Value Ref Range Status   Result 1 Comment  Final    Comment: (NOTE) KOH/Calcofluor preparation:  no fungus observed. Performed At: [facility] [address] [place], [place] [id number] Jolene Schimke MD UU:[id number]   MRSA PCR Screening     Status: None   Collection Time: 06/09/20  8:12 AM   Specimen: Nasal Mucosa; Nasopharyngeal  Result Value Ref Range Status   MRSA by PCR NEGATIVE NEGATIVE Final     Comment:        The GeneXpert MRSA Assay (FDA approved for NASAL specimens only), is one component of a comprehensive MRSA colonization surveillance program. It is not intended to diagnose MRSA infection nor to guide or monitor treatment for MRSA infections. Performed at [facility], [address] [place], [place] [address]       Imaging Studies   No results found.   Medications   Scheduled Meds: . amiodarone  200 mg Oral Daily  . apixaban  5 mg Oral BID  . atorvastatin  80 mg Oral Daily  . feeding supplement  1 Container Oral Q24H  . ferrous gluconate  324 mg Oral Daily  . insulin aspart  0-5 Units Subcutaneous QHS  . insulin aspart  0-9 Units Subcutaneous TID WC  . insulin glargine  17 Units Subcutaneous Daily  . isosorbide-hydrALAZINE  2 tablet Oral TID  . magnesium oxide  400 mg Oral Daily  . polyethylene glycol  17 g Oral BID  . scopolamine  1 patch Transdermal Q72H  . senna-docusate  1 tablet Oral BID  . sodium zirconium cyclosilicate  10 g Oral Daily   Continuous Infusions: . sodium chloride Stopped (06/12/20 0350)       LOS: 8 days    Time spent: 20 minutes    Pennie Banter, DO [facility]  06/15/2020, 1:59 PM      If 7PM-7AM, please contact night-coverage. How to contact the [facility] Attending or Consulting provider 7A - 7P or covering provider during after hours 7P -7A, for this patient?    1. Check the care team in [facility] and look for a) attending/consulting TRH provider listed and b) the [facility] team listed 2. Log into [email or web address] and use [place]'s universal password to access. If you do not have the password, please contact the hospital operator. 3. Locate the [facility] provider you are looking for under [facility] and page to a number that you can be directly reached. 4. If you still have difficulty reaching the provider, please page the [facility] (Director on Call) for the Hospitalists listed on amion for  assistance.

## 2020-06-16 ENCOUNTER — Encounter (HOSPITAL_COMMUNITY): Payer: Medicare Other

## 2020-06-16 LAB — BASIC METABOLIC PANEL
Anion gap: 4 — ABNORMAL LOW (ref 5–15)
BUN: 11 mg/dL (ref 8–23)
CO2: 29 mmol/L (ref 22–32)
Calcium: 8.1 mg/dL — ABNORMAL LOW (ref 8.9–10.3)
Chloride: 102 mmol/L (ref 98–111)
Creatinine, Ser: 0.46 mg/dL (ref 0.44–1.00)
GFR, Estimated: >60 mL/min (ref >60)
Glucose, Bld: 92 mg/dL (ref 70–99)
Potassium: 4.3 mmol/L (ref 3.5–5.1)
Sodium: 135 mmol/L (ref 135–145)

## 2020-06-16 LAB — GLUCOSE, CAPILLARY
Glucose-Capillary: 91 mg/dL (ref 70–99)
Glucose-Capillary: 179 mg/dL — ABNORMAL HIGH (ref 70–99)
Glucose-Capillary: 151 mg/dL — ABNORMAL HIGH (ref 70–99)
Glucose-Capillary: 143 mg/dL — ABNORMAL HIGH (ref 70–99)

## 2020-06-16 MED ORDER — METHOCARBAMOL 500 MG PO TABS
500.0000 mg | ORAL_TABLET | Freq: Three times a day (TID) | ORAL | Status: DC | PRN
  Administered 2020-06-16 – 2020-06-21 (×9): 500 mg via ORAL
  Filled 2020-06-16 (×9): qty 1

## 2020-06-16 MED ORDER — INSULIN ASPART 100 UNIT/ML IJ SOLN
3.0000 [IU] | Freq: Three times a day (TID) | INTRAMUSCULAR | Status: DC
  Administered 2020-06-16 – 2020-06-18 (×3): 3 [IU] via SUBCUTANEOUS

## 2020-06-16 NOTE — Progress Notes (Signed)
Inpatient Diabetes Program Recommendations  AACE/ADA: New Consensus Statement on Inpatient Glycemic Control (2015)  Target Ranges:  Prepandial:   less than 140 mg/dL      Peak postprandial:   less than 180 mg/dL (1-2 hours)      Critically ill patients:  140 - 180 mg/dL   Lab Results  Component Value Date   GLUCAP 91 06/16/2020   HGBA1C 10.5 (H) 05/13/2020    Review of Glycemic Control  Results for [name] [name], [name] [name] (MRN [id number]) as of 06/16/2020 11:01  Ref. Range 06/15/2020 08:03 06/15/2020 12:08 06/15/2020 16:29 06/15/2020 21:37 06/16/2020 07:39  Glucose-Capillary Latest Ref Range: 70 - 99 mg/dL 240 (H) 973 (H) 532 (H) 225 (H) 91   Diabetes history: DM 2 Outpatient Diabetes medications: Lantus 15 units, Novolog 0-14 units tid Current orders for Inpatient glycemic control:  Lantus 17 units Novolog 0-9 units tid + hs  Inpatient Diabetes Program Recommendations:    -  May consider Novolog 3 units tid meal coverage if eating >50% of meals.  Thanks,  Christena Deem RN, MSN, BC-ADM Inpatient Diabetes Coordinator Team Pager [phone] (8a-5p)

## 2020-06-16 NOTE — Progress Notes (Signed)
PROGRESS NOTE    [name] [name]   TWS:[id number]  DOB: [birth year]  PCP: Dana Allan, MD    DOA: 06/06/2020 LOS: 9   Brief Narrative   Patient 70 year old female history of CHF(EF<20%), paroxysmal A. fib on chronic anticoagulation, hypertension, hyperlipidemia, IDDM type II, bipolar disorder recently hospitalized for acute embolic stroke requiring tPA and mechanical thrombectomy with hospital course complicated by cardiogenic shock/PEA cardiac arrest immediately following thrombectomy.  During that hospitalization patient required milrinone infusion and diuretics.  Patient also required vasopressors was extubated and weaned off pressors on 05/13/2020.  Patient status post TEE/DCCV on 5/9 and discharged to [facility] 5/16.  Patient presented to the ED from SNF with right-sided abdominal pain, nausea vomiting, shortness of breath with speaking.  Patient noted to have a leukocytosis with a white count of 12.9, hemoglobin of 9.3, platelet count of 493.  Patient pancultured.  Chest x-ray showed complete opacification of the left hemithorax.  CT chest concerning for large complex multiloculated left pleural effusion increased in size since prior CT of 05/12/2020 with differential concerning for empyema versus sequelae of previous hemothorax versus neoplasm.  IR consulted and pigtail catheter drain placed with 950 cc of bloody fluid aspirated.  PCCM consulted and patient with fibrinolysis 06/08/2020.  Cardiology also consulted for management of chronic complicated cardiac history.   5/28 admitted to [facility], 50fr chest tube placed by IR  TPA/DNAse fibrinolysis daily x 6 from 5/29-6/3  Chest tube removed 6/5    Assessment & Plan   Principal Problem:   Pleural effusion Active Problems:   HTN (hypertension)   Mixed hyperlipidemia   Uncontrolled type 2 diabetes mellitus with hyperglycemia (HCC)   A-fib (HCC)   Pleural effusion on left   Right upper quadrant abdominal pain   Chest pain   QT  prolongation   Large left multiloculated exudative pleural effusion Pt presented with abdominal pain/chest pain, shortness of breath with speaking. -PCCM/pulmonary following - see their recs -Exudative pleural effusion by lights criteria. -CT chest with large complex multilocular left pleural effusion increased in size since recent CT of the neck 05/12/2020. -IR consulted and pigtail drain catheter placed with 950 cc of bloody aspirate noted. -status post tPA/DNase on 5/29, 30, 31, 6/1, 6/2, 6/3 -Resumed on Eliquis 6/3 PM -Completed antibiotics 6/1 (treated w/Cefepime, Flagyl) -Chest tube removed 6/5  Right-sided abdominal pain/nausea and vomiting -Likely secondary to rib fractures and also possible problem #1 versus secondary to recent resuscitation and possible constipation -CT abdomen and pelvis with no acute intra-abdominal process. -Abdominal ultrasound unremarkable. -COVID-19 PCR negative, influenza A and B PCR negative. -Lipase within normal limits. -IV Toradol 15 mg every 6 hours as needed pain. -Oxycodone as needed. -Supportive care.  Hyperkalemia -initial a.m. reading of K7.0 (6/2) false, repeat 5.9. Given Lokelma 10 g once.  K still 5.9. Repeated Loklema - 6/4 6/5 potassium 5.1  No associated EKG changes. Stop Lokelma and repeat BMP in AM  Chest pain -Likely secondary to problem #1. -Cardiology consulted - feel patient's chest pain syndrome is noncardiac with flat troponin trend.  Recent embolic CVA -Status post tPA and mechanical thrombectomy during prior hospitalization. -Eliquis was held due to primary problem, bloody pleural fluid Resumed Eliquis 6/32 PM (tPA treatments completed)  Paroxysmal atrial fibrillation/status post TEE/ DCCV 5/9 -In NSR.   -Continue amiodarone.   -Eliquis resumed (was on hold during fibrinolysis)  QT prolongation -Concerning as patient noted to be on amiodarone. -Cardiology consulted: QT prolongation is normal on  amiodarone.  Nonischemic cardiomyopathy/chronic systolic heart failure -Norvasc, hydralazine, Imdur. -Not on spironolactone/ACE/ARB due to history of hyperkalemia. -Not on metoprolol due to allergy. -Not on Entresto or farxiga due to orthostasis -Per cardiology.  Hypertension -Continue Norvasc, hydralazine, Imdur.   -Hydralazine and Imdur have been uptitrated per cardiology for better blood pressure control. -Per cardiology.    Hyperlipidemia -Continue statin.    Poorly controlled insulin-dependent type 2 diabetes mellitus -Hemoglobin A1c 10.5 (05/13/2020) -Continue Lantus, SSI.  Titrate insulin for inpatient goal 140-180  Constipation -Continue MiraLAX twice daily -Placed on Senokot-S twice daily -Smog enema x1.   Patient BMI: Body mass index is 30.93 kg/m.   DVT prophylaxis: Place and maintain sequential compression device Start: 06/08/20 1841 Place and maintain sequential compression device Start: 06/07/20 1434   Diet:  Diet Orders (From admission, onward)    Start     Ordered   06/11/20 1030  Diet Heart Room service appropriate? Yes; Fluid consistency: Thin; Fluid restriction: 1500 mL Fluid  Diet effective now       Question Answer Comment  Room service appropriate? Yes   Fluid consistency: Thin   Fluid restriction: 1500 mL Fluid      06/11/20 1029            Code Status: Full Code    Subjective 06/16/20    Patient up in recliner.  Chest tube removed yesterday.  She says her pain is better now.  Little nausea this AM.  Otherwise says feeling fine.  Says her strength during transfer from bed to chair today was "better than last time".  Disposition Plan & Communication   Status is: Inpatient  Remains inpatient appropriate because:Inpatient level of care appropriate due to severity of illness with chest tube in place and receiving fibrinolysis with pulmonary for complicated pleural effusion.   Dispo: The patient is from: Home               Anticipated d/c is to: Home  (SNF recommended)              Patient currently is not medically stable to d/c.   Difficult to place patient No   Consults, Procedures, Significant Events   Consultants:   Pulmonology  Cardiology  Interventional radiology  Procedures:   CT chest 06/07/2020  CT abdomen and pelvis 06/06/2020  Chest x-ray 06/06/2020, 06/08/2020  Abdominal ultrasound 06/07/2020  CT-guided placement of 14 French all-purpose drain catheter into left pleural space with aspiration of 950 cc of bloody fluid--- Per Dr. Grace IsaacWatts, IR 06/07/2020  Pleural fibrinolysis x 6 days  Antimicrobials:  Anti-infectives (From admission, onward)   Start     Dose/Rate Route Frequency Ordered Stop   06/10/20 1400  metroNIDAZOLE (FLAGYL) tablet 500 mg  Status:  Discontinued        500 mg Oral Every 8 hours 06/10/20 1142 06/12/20 1356   06/08/20 0000  vancomycin (VANCOCIN) IVPB 1000 mg/200 mL premix  Status:  Discontinued        1,000 mg 200 mL/hr over 60 Minutes Intravenous Every 24 hours 06/07/20 0647 06/09/20 0800   06/07/20 1000  metroNIDAZOLE (FLAGYL) IVPB 500 mg  Status:  Discontinued        500 mg 100 mL/hr over 60 Minutes Intravenous Every 8 hours 06/07/20 0635 06/10/20 1142   06/07/20 1000  ceFEPIme (MAXIPIME) 2 g in sodium chloride 0.9 % 100 mL IVPB  Status:  Discontinued        2 g 200 mL/hr over 30 Minutes Intravenous Every 8  hours 06/07/20 0647 06/12/20 1356   06/07/20 0115  vancomycin (VANCOCIN) IVPB 1000 mg/200 mL premix        1,000 mg 200 mL/hr over 60 Minutes Intravenous  Once 06/07/20 0114 06/07/20 0512   06/07/20 0115  ceFEPIme (MAXIPIME) 1 g in sodium chloride 0.9 % 100 mL IVPB  Status:  Discontinued        1 g 200 mL/hr over 30 Minutes Intravenous  Once 06/07/20 0114 06/07/20 0114   06/07/20 0115  metroNIDAZOLE (FLAGYL) IVPB 500 mg        500 mg 100 mL/hr over 60 Minutes Intravenous  Once 06/07/20 0114 06/07/20 0512   06/07/20 0115  ceFEPIme (MAXIPIME) 2 g in sodium  chloride 0.9 % 100 mL IVPB        2 g 200 mL/hr over 30 Minutes Intravenous  Once 06/07/20 0114 06/07/20 0512        Micro    Objective   Vitals:   06/15/20 1212 06/15/20 2138 06/16/20 0524 06/16/20 1122  BP: (!) 151/68 131/60 139/60 124/62  Pulse: 79 80 83 80  Resp: 20 18 18  (!) 24  Temp: 97.7 F (36.5 C) 97.9 F (36.6 C) 98.2 F (36.8 C) 98.2 F (36.8 C)  TempSrc: Oral Oral Oral Oral  SpO2: 97% 92% 90% 97%  Weight:      Height:        Intake/Output Summary (Last 24 hours) at 06/16/2020 1408 Last data filed at 06/16/2020 1300 Gross per 24 hour  Intake 840 ml  Output 1250 ml  Net -410 ml   Filed Weights   06/13/20 0430 06/14/20 0342 06/15/20 0500  Weight: 78 kg 77.6 kg 79.2 kg    Physical Exam:  General exam: awake, alert, no acute distress, in recliner Respiratory system: CTAB, normal respiratory effort, chest tube has been removed Cardiovascular system: S1/S2 normal, RRR, no peripheral edema Central nervous system: A&O x3. Grossly non-focal exam, normal speech  Labs   Data Reviewed: I have personally reviewed following labs and imaging studies  CBC: Recent Labs  Lab 06/10/20 0431 06/11/20 0440 06/12/20 0457 06/13/20 0504 06/14/20 0535  WBC 10.3 7.8 7.9 9.2 7.5  NEUTROABS 7.5  --   --   --   --   HGB 9.8* 9.3* 10.0* 9.0* 9.7*  HCT 32.8* 30.9* 32.6* 28.8* 30.8*  MCV 82.6 82.0 80.5 79.6* 78.0*  PLT 557* 589* 529* 579* 524*   Basic Metabolic Panel: Recent Labs  Lab 06/10/20 0431 06/11/20 0440 06/12/20 0725 06/12/20 1029 06/13/20 0504 06/13/20 1720 06/14/20 0535 06/15/20 0506 06/16/20 0504  NA 137   < > 133*  --  134*  --  133* 136 135  K 5.1   < > 5.9*  --  5.9* 5.7* 5.1 4.8 4.3  CL 103   < > 102  --  102  --  101 101 102  CO2 31   < > 28  --  26  --  26 30 29   GLUCOSE 134*   < > 111*  --  128*  --  66* 94 92  BUN 26*   < > 19  --  20  --  16 12 11   CREATININE 0.78   < > 0.68  --  0.79  --  0.57 0.74 0.46  CALCIUM 8.4*   < > 8.4*  --  8.7*   --  8.6* 8.4* 8.1*  MG 1.9  --   --  1.7  --   --   --   --   --    < > =  values in this interval not displayed.   GFR: Estimated Creatinine Clearance: 67 mL/min (by C-G formula based on SCr of 0.46 mg/dL). Liver Function Tests: No results for input(s): AST, ALT, ALKPHOS, BILITOT, PROT, ALBUMIN in the last 168 hours. No results for input(s): LIPASE, AMYLASE in the last 168 hours. No results for input(s): AMMONIA in the last 168 hours. Coagulation Profile: No results for input(s): INR, PROTIME in the last 168 hours. Cardiac Enzymes: Recent Labs  Lab 06/13/20 1720  CKTOTAL 255*   BNP (last 3 results) No results for input(s): PROBNP in the last 8760 hours. HbA1C: No results for input(s): HGBA1C in the last 72 hours. CBG: Recent Labs  Lab 06/15/20 1208 06/15/20 1629 06/15/20 2137 06/16/20 0739 06/16/20 1116  GLUCAP 199* 320* 225* 91 179*   Lipid Profile: No results for input(s): CHOL, HDL, LDLCALC, TRIG, CHOLHDL, LDLDIRECT in the last 72 hours. Thyroid Function Tests: No results for input(s): TSH, T4TOTAL, FREET4, T3FREE, THYROIDAB in the last 72 hours. Anemia Panel: No results for input(s): VITAMINB12, FOLATE, FERRITIN, TIBC, IRON, RETICCTPCT in the last 72 hours. Sepsis Labs: No results for input(s): PROCALCITON, LATICACIDVEN in the last 168 hours.  Recent Results (from the past 240 hour(s))  Culture, blood (routine x 2)     Status: None   Collection Time: 06/07/20  2:10 AM   Specimen: BLOOD  Result Value Ref Range Status   Specimen Description   Final    BLOOD LEFT ANTECUBITAL Performed at [facility], [address], [place], [place] [address]    Special Requests   Final    BOTTLES DRAWN AEROBIC AND ANAEROBIC Blood Culture adequate volume Performed at [facility], [address], [place], [place] [address]    Culture   Final    NO GROWTH 5 DAYS Performed at [facility], [address] [place], [place] [address]     Report Status 06/12/2020 FINAL  Final  Culture, blood (Routine X 2) w Reflex to ID Panel     Status: None   Collection Time: 06/07/20  2:15 AM   Specimen: BLOOD  Result Value Ref Range Status   Specimen Description   Final    BLOOD BLOOD RIGHT HAND Performed at [facility], [address], [place], [place] [address]    Special Requests   Final    BOTTLES DRAWN AEROBIC ONLY Blood Culture adequate volume Performed at [facility], [address], [place], [place] [address]    Culture   Final    NO GROWTH 5 DAYS Performed at [facility], [address], [place], [place] [address]    Report Status 06/12/2020 FINAL  Final  Resp Panel by RT-PCR (Flu A&B, Covid) Nasopharyngeal Swab     Status: None   Collection Time: 06/07/20  2:26 AM   Specimen: Nasopharyngeal Swab; Nasopharyngeal(NP) swabs in vial transport medium  Result Value Ref Range Status   SARS Coronavirus 2 by RT PCR NEGATIVE NEGATIVE Final    Comment: (NOTE) SARS-CoV-2 target nucleic acids are NOT DETECTED.  The SARS-CoV-2 RNA is generally detectable in upper respiratory specimens during the acute phase of infection. The lowest concentration of SARS-CoV-2 viral copies this assay can detect is 138 copies/mL. A negative result does not preclude SARS-Cov-2 infection and should not be used as the sole basis for treatment or other patient management decisions. A negative result may occur with  improper specimen collection/handling, submission of specimen other than nasopharyngeal  swab, presence of viral mutation(s) within the areas targeted by this assay, and inadequate number of viral copies(<138 copies/mL). A negative result must be combined with clinical observations, patient history, and epidemiological information. The expected result is Negative.  Fact Sheet for Patients:  BloggerCourse.com  Fact Sheet for Healthcare Providers:   SeriousBroker.it  This test is no t yet approved or cleared by the [place] FDA and  has been authorized for detection and/or diagnosis of SARS-CoV-2 by FDA under an Emergency Use Authorization (EUA). This EUA will remain  in effect (meaning this test can be used) for the duration of the COVID-19 declaration under Section 564(b)(1) of the Act, 21 U.S.C.section 360bbb-3(b)(1), unless the authorization is terminated  or revoked sooner.       Influenza A by PCR NEGATIVE NEGATIVE Final   Influenza B by PCR NEGATIVE NEGATIVE Final    Comment: (NOTE) The Xpert Xpress SARS-CoV-2/FLU/RSV plus assay is intended as an aid in the diagnosis of influenza from Nasopharyngeal swab specimens and should not be used as a sole basis for treatment. Nasal washings and aspirates are unacceptable for Xpert Xpress SARS-CoV-2/FLU/RSV testing.  Fact Sheet for Patients: BloggerCourse.com  Fact Sheet for Healthcare Providers: SeriousBroker.it  This test is not yet approved or cleared by the [place] FDA and has been authorized for detection and/or diagnosis of SARS-CoV-2 by FDA under an Emergency Use Authorization (EUA). This EUA will remain in effect (meaning this test can be used) for the duration of the COVID-19 declaration under Section 564(b)(1) of the Act, 21 U.S.C. section 360bbb-3(b)(1), unless the authorization is terminated or revoked.  Performed at [facility], [address] [place], [place] [address]   Culture, Urine     Status: Abnormal   Collection Time: 06/07/20  6:38 AM   Specimen: Urine, Random  Result Value Ref Range Status   Specimen Description   Final    URINE, RANDOM Performed at [facility], [address] [place], [place] [address]    Special Requests   Final    NONE Performed at [facility], [address] [place],  [place] [address]    Culture MULTIPLE SPECIES PRESENT, SUGGEST RECOLLECTION (A)  Final   Report Status 06/08/2020 FINAL  Final  Fungus Culture With Stain     Status: None (Preliminary result)   Collection Time: 06/07/20 12:44 PM   Specimen: Pleural Fluid  Result Value Ref Range Status   Fungus Stain Final report  Final    Comment: (NOTE) Performed At: [facility] [address] [place], [place] [id number] Jolene Schimke MD HU:[id number]    Fungus (Mycology) Culture PENDING  Incomplete   Fungal Source PLEURAL  Final    Comment: LEFT Performed at [facility], [address], [place], [place] [address]   Body fluid culture w Gram Stain     Status: None   Collection Time: 06/07/20 12:44 PM   Specimen: Pleura  Result Value Ref Range Status   Specimen Description   Final    PLEURAL LEFT Performed at [facility], [address], [place], [place] [address]    Special Requests   Final    NONE Performed at [facility], [address], [place], [place] [address]    Gram Stain   Final    RARE WBC PRESENT,BOTH PMN AND MONONUCLEAR NO ORGANISMS SEEN    Culture   Final    NO GROWTH 3 DAYS Performed at  [facility], [address], [place], [place] [address]    Report Status 06/10/2020 FINAL  Final  Fungus Culture Result     Status: None   Collection Time: 06/07/20 12:44 PM  Result Value Ref Range Status   Result 1 Comment  Final    Comment: (NOTE) KOH/Calcofluor preparation:  no fungus observed. Performed At: [facility] [address] [place], [place] [id number] Jolene Schimke MD [id number]   MRSA PCR Screening     Status: None   Collection Time: 06/09/20  8:12 AM   Specimen: Nasal Mucosa; Nasopharyngeal  Result Value Ref Range Status   MRSA by PCR NEGATIVE NEGATIVE Final    Comment:        The GeneXpert MRSA Assay (FDA approved for NASAL specimens only), is one component of a comprehensive MRSA  colonization surveillance program. It is not intended to diagnose MRSA infection nor to guide or monitor treatment for MRSA infections. Performed at [facility], [address], [place], [place] [address]       Imaging Studies   DG Chest 2 View  Result Date: 06/15/2020 CLINICAL DATA:  Loculated left pleural effusion. EXAM: CHEST - 2 VIEW COMPARISON:  June 13, 2020. FINDINGS: Pigtail pleural catheter in the left lung base, unchanged. Similar size of the layering small left pleural effusion and left lower lobe airspace opacities. No visible pneumothorax. Right lung is clear. The heart size and mediastinal contours are unchanged. The visualized skeletal structures are unchanged the age. IMPRESSION: Similar size of the layering small left pleural effusion and left lower lobe airspace opacities. Electronically Signed   By: Maudry Mayhew MD   On: 06/15/2020 14:36   DG CHEST PORT 1 VIEW  Result Date: 06/15/2020 CLINICAL DATA:  Status post LEFT chest tube removal. EXAM: PORTABLE CHEST 1 VIEW COMPARISON:  Chest x-rays dated 06/15/2020 and 06/13/2020. FINDINGS: LEFT-sided chest tube has been removed. LEFT lower lobe airspace opacities stable, likely a combination of atelectasis and small pleural effusion. RIGHT lung is clear. No pneumothorax is seen. IMPRESSION: 1. Interval removal of the LEFT-sided chest tube. No pneumothorax. 2. Otherwise stable chest x-ray. Stable LEFT lower lobe opacity which is most likely a combination of atelectasis and small pleural effusion. Electronically Signed   By: Bary Richard M.D.   On: 06/15/2020 15:59     Medications   Scheduled Meds: . amiodarone  200 mg Oral Daily  . apixaban  5 mg Oral BID  . atorvastatin  80 mg Oral Daily  . feeding supplement  1 Container Oral Q24H  . ferrous gluconate  324 mg Oral Daily  . insulin aspart  0-5 Units Subcutaneous QHS  . insulin aspart  0-9 Units Subcutaneous TID WC  . insulin aspart  3 Units Subcutaneous  TID WC  . insulin glargine  17 Units Subcutaneous Daily  . isosorbide-hydrALAZINE  2 tablet Oral TID  . magnesium oxide  400 mg Oral Daily  . polyethylene glycol  17 g Oral BID  . scopolamine  1 patch Transdermal Q72H  . senna-docusate  1 tablet Oral BID   Continuous Infusions: . sodium chloride Stopped (06/12/20 0350)       LOS: 9 days    Time spent: 20 minutes    Pennie Banter, DO [facility]  06/16/2020, 2:08 PM      If 7PM-7AM, please contact night-coverage. How to contact the [facility] Attending or Consulting provider 7A - 7P or covering provider during after hours 7P -7A,  for this patient?    1. Check the care team in [facility] and look for a) attending/consulting TR[facility] provider listed and b) the [facility] team listed 2. Log into [email or web address] and use [facility] universal password to access. If you do not have the password, please contact the hospital operator. 3. Locate the [facility] provider you are looking for under [facility] and page to a number that you can be directly reached. 4. If you still have difficulty reaching the provider, please page the [facility] (Director on Call) for the Hospitalists listed on amion for assistance.

## 2020-06-16 NOTE — Progress Notes (Signed)
NAME:  [name] [name], MRN:  [id number], DOB:  1950/06/13, LOS: 9 ADMISSION DATE:  06/06/2020, CONSULTATION DATE:  06/07/20 REFERRING MD:  Ramiro Harvest, MD CHIEF COMPLAINT:  Pleural effusion  History of Present Illness:  [name] [name] is a 70 year old woman with CHF, paroxysmal atrial fibrillation, and DMII who was recently admitted 5/2 to 5/16 for acute embolic stroke due to PAG s/p TPA therapy and mechanical thrombectomy. Her course was complicated by cardiac arrest and cardiogenic shock. She required milrinone drip and cardioversion for the atrial fibrillation. She has been eliquis for anticoagulation after the stroke. She was sent to [facility] place on 5/16 where she presented from with right sided abdominal pain, nausea and vomiting. She also complained of some shortness of breath and has been on 3L of O2 since being at [facility].   Work up in the ED revealed left hemithorax opacification based on chest radiograph and CT chest showed loculated pleural effusion involving the left hemithorax.   IR was consulted for chest tube placement and a 84fr pigtail catheter was placed via CT guidance with of bloody appearing fluid removed initially.   PCCM was consulted for further evaluation and treatment of the left pleural effusion.   Pleural fluid studies show LDH 1636, total protein 3.1, glucose 134, albumin 1.5. Cell count is pending at this time.   Pertinent  Medical History  Atrial Fibrillation Heart Failure DMII  Significant Hospital Events: Including procedures, antibiotic start and stop dates in addition to other pertinent events   . 5/28 admitted to [facility], 18fr chest tube placed by IR . 5/29 1st dose of TPA/DNAse . 5/30 2nd dose of TPA/DNAse . 5/31 3rd dose of TPA/DNAse . 6/1 4th dose of TPA/DNAse . 6/2 5th dose of TPA/DNAse . 6/3 6th dose of TAP/DNAse  . 6/5 Pull  L chest tube  Interim History / Subjective:   No pneumo post-chest tube removal Pt sitting up in chair eating  lunch on RA with no complaints and feeling improved  Objective   Blood pressure 124/62, pulse 80, temperature 98.2 F (36.8 C), temperature source Oral, resp. rate (!) 24, height 5\' 3"  (1.6 m), weight 79.2 kg, SpO2 97 %.        Intake/Output Summary (Last 24 hours) at 06/16/2020 1658 Last data filed at 06/16/2020 1543 Gross per 24 hour  Intake 1080 ml  Output 1250 ml  Net -170 ml   Filed Weights   06/13/20 0430 06/14/20 0342 06/15/20 0500  Weight: 78 kg 77.6 kg 79.2 kg     General: Elderly, well-appearing female sitting up in bed on room air in no distress HEENT: MM pink/moist Neuro: Awake, alert, oriented x3 CV: s1s2 RRR, no m/r/g PULM: Mildly decreased air entry in bilateral bases, left chest tube site without bleeding or drainage GI: soft, bsx4 active  Extremities: warm/dry, 1+ edema  Skin: no rashes or lesions    Assessment & Plan:   Left complicated pleural effusion Based on review of chest imaging the pleural effusion is new since 01/2020 and first noted on chest radiograph 05/13/20.  Pleural fluid studies indicatedexudative process based on LDH ratio.  -Cytology showed lymphocytosis, TPA/DNAse therapy initiated 5/29 >>completed a total of 6 treatments. P: -Chest tube removed 6/5 without residual pneumothorax, some remaining left pleural effusion.  -Completed cefepime and Flagyl -Would benefit from repeat CT chest in 6-8 weeks to ensure resolution, can be followed by PCP -PCCM will sign off, please contact for further critical care pulmonary concerns.  Labs   CBC: Recent Labs  Lab 06/10/20 0431 06/11/20 0440 06/12/20 0457 06/13/20 0504 06/14/20 0535  WBC 10.3 7.8 7.9 9.2 7.5  NEUTROABS 7.5  --   --   --   --   HGB 9.8* 9.3* 10.0* 9.0* 9.7*  HCT 32.8* 30.9* 32.6* 28.8* 30.8*  MCV 82.6 82.0 80.5 79.6* 78.0*  PLT 557* 589* 529* 579* 524*    Basic Metabolic Panel: Recent Labs  Lab 06/10/20 0431 06/11/20 0440 06/12/20 0725 06/12/20 1029  06/13/20 0504 06/13/20 1720 06/14/20 0535 06/15/20 0506 06/16/20 0504  NA 137   < > 133*  --  134*  --  133* 136 135  K 5.1   < > 5.9*  --  5.9* 5.7* 5.1 4.8 4.3  CL 103   < > 102  --  102  --  101 101 102  CO2 31   < > 28  --  26  --  26 30 29   GLUCOSE 134*   < > 111*  --  128*  --  66* 94 92  BUN 26*   < > 19  --  20  --  16 12 11   CREATININE 0.78   < > 0.68  --  0.79  --  0.57 0.74 0.46  CALCIUM 8.4*   < > 8.4*  --  8.7*  --  8.6* 8.4* 8.1*  MG 1.9  --   --  1.7  --   --   --   --   --    < > = values in this interval not displayed.   GFR: Estimated Creatinine Clearance: 67 mL/min (by C-G formula based on SCr of 0.46 mg/dL). Recent Labs  Lab 06/11/20 0440 06/12/20 0457 06/13/20 0504 06/14/20 0535  WBC 7.8 7.9 9.2 7.5    Liver Function Tests: No results for input(s): AST, ALT, ALKPHOS, BILITOT, PROT, ALBUMIN in the last 168 hours. No results for input(s): LIPASE, AMYLASE in the last 168 hours. No results for input(s): AMMONIA in the last 168 hours.  ABG    Component Value Date/Time   PHART 7.401 05/12/2020 1602   PCO2ART 37.6 05/12/2020 1602   PO2ART 349 (H) 05/12/2020 1602   HCO3 23.5 05/12/2020 1602   TCO2 25 05/12/2020 1602   ACIDBASEDEF 1.0 05/12/2020 1602   O2SAT 73.7 05/20/2020 0520     Coagulation Profile: No results for input(s): INR, PROTIME in the last 168 hours.  Cardiac Enzymes: Recent Labs  Lab 06/13/20 1720  CKTOTAL 255*    HbA1C: HbA1c, POC (controlled diabetic range)  Date/Time Value Ref Range Status  12/30/2017 10:02 AM 9.4 (A) 0.0 - 7.0 % Final  09/06/2017 01:31 PM 8.8 (A) 0.0 - 7.0 % Final   HbA1c POC (<> result, manual entry)  Date/Time Value Ref Range Status  07/26/2019 11:08 AM >15.0 4.0 - 5.6 % Final   Hgb A1c MFr Bld  Date/Time Value Ref Range Status  05/13/2020 05:27 AM 10.5 (H) 4.8 - 5.6 % Final    Comment:    (NOTE) Pre diabetes:          5.7%-6.4%  Diabetes:              >6.4%  Glycemic control for   <7.0% adults  with diabetes   02/04/2020 06:57 AM 10.4 (H) 4.8 - 5.6 % Final    Comment:    (NOTE) Pre diabetes:          5.7%-6.4%  Diabetes:              >  6.4%  Glycemic control for   <7.0% adults with diabetes     CBG: Recent Labs  Lab 06/15/20 1629 06/15/20 2137 06/16/20 0739 06/16/20 1116 06/16/20 1609  GLUCAP 320* 225* 91 179* 151*    Marshal Schrecengost R Trashaun Streight, PA-C [facility] See Amion for pager If no response to pager , please call [phone] until 7pm After 7:00 pm call Elink  [phone]

## 2020-06-16 NOTE — Progress Notes (Signed)
Physical Therapy Treatment Patient Details Name: [name] [name] MRN: [id number] DOB: Nov 20, 1950 Today's Date: 06/16/2020    History of Present Illness 70 yo female admited with "large/complicated" pleural effusion, L hemithorax s/p chest tube placement. CT removed 6/5Recent last admit-(CVA, post procedure cardiac arrest, cardiogenic shock, DCCV). Hx of DM, Afib, COVID, schizophrenia, bipolar d/o, CHF    PT Comments    Activity tolerance improving. Pt able to walk short distance into hallway, maintaining SpO2= 89-91% on RA with activity, mid 90s at rest. Pt family asking about CIR.   Follow Up Recommendations  CIR     Equipment Recommendations  Rolling walker with 5" wheels;3in1 (PT)    Recommendations for Other Services       Precautions / Restrictions Precautions Precautions: Fall Restrictions Weight Bearing Restrictions: No    Mobility  Bed Mobility Overal bed mobility: Needs Assistance Bed Mobility: Supine to Sit     Supine to sit: Min guard;HOB elevated     General bed mobility comments: able to elevate trunk with useof bedrail andincr time    Transfers Overall transfer level: Needs assistance Equipment used: Rolling walker (2 wheeled) Transfers: Sit to/from Stand Sit to Stand: Min guard;Min assist         General transfer comment: light min assist to min guard to steady and transition to RW, cues for hand placement  Ambulation/Gait Ambulation/Gait assistance: Min assist Gait Distance (Feet): 30 Feet Assistive device: Rolling walker (2 wheeled) Gait Pattern/deviations: Step-through pattern;Decreased stride length;Trunk flexed Gait velocity: Decreased   General Gait Details: cues for trunk extension, RW position and safety with turns   Optometrist    Modified Rankin (Stroke Patients Only)       Balance             Standing balance-Leahy Scale: Poor Standing balance comment: Reliant on BUE on RW.                             Cognition Arousal/Alertness: Awake/alert Behavior During Therapy: WFL for tasks assessed/performed;Flat affect Overall Cognitive Status: Within Functional Limits for tasks assessed                               Problem Solving: Slow processing;Decreased initiation;Requires verbal cues        Exercises      General Comments        Pertinent Vitals/Pain Pain Assessment: Faces Faces Pain Scale: Hurts a little bit Pain Location: back and R side Pain Descriptors / Indicators: Sore;Grimacing Pain Intervention(s): Limited activity within patient's tolerance;Monitored during session;Repositioned    Home Living                      Prior Function            PT Goals (current goals can now be found in the care plan section) Acute Rehab PT Goals Patient Stated Goal: get home PT Goal Formulation: With patient Time For Goal Achievement: 06/23/20 Potential to Achieve Goals: Good Progress towards PT goals: Progressing toward goals    Frequency    Min 2X/week      PT Plan Current plan remains appropriate    Co-evaluation              AM-PAC PT "6 Clicks" Mobility   Outcome Measure  Help needed turning from your back to your side while in a flat bed without using bedrails?: A Lot Help needed moving from lying on your back to sitting on the side of a flat bed without using bedrails?: A Lot Help needed moving to and from a bed to a chair (including a wheelchair)?: A Little Help needed standing up from a chair using your arms (e.g., wheelchair or bedside chair)?: A Little Help needed to walk in hospital room?: A Little Help needed climbing 3-5 steps with a railing? : A Little 6 Click Score: 16    End of Session Equipment Utilized During Treatment: Gait belt Activity Tolerance: Patient limited by fatigue;Patient tolerated treatment well Patient left: in chair;with call bell/phone within reach;with chair alarm  set   PT Visit Diagnosis: Muscle weakness (generalized) (M62.81);Difficulty in walking, not elsewhere classified (R26.2);History of falling (Z91.81)     Time: 4403-4742 PT Time Calculation (min) (ACUTE ONLY): 14 min  Charges:  $Gait Training: 8-22 mins                     Delice Bison, PT  Acute Rehab Dept ([facility]) [phone] Pager [phone]  06/16/2020    [facility] 06/16/2020, 2:57 PM

## 2020-06-16 NOTE — Progress Notes (Signed)
Rehab Admissions Coordinator Note:  Patient was screened by Clois Dupes for appropriateness for an Inpatient Acute Rehab Consult per change in therapy recommendation today from SNF. Family requesting CIR admit.I will place order per protocol so that an Admissions Coordinator can do full assessment for possible CIR admit.   Clois Dupes RN MSN 06/16/2020, 5:51 PM  I can be reached at [phone].

## 2020-06-17 LAB — GLUCOSE, CAPILLARY
Glucose-Capillary: 83 mg/dL (ref 70–99)
Glucose-Capillary: 208 mg/dL — ABNORMAL HIGH (ref 70–99)
Glucose-Capillary: 51 mg/dL — ABNORMAL LOW (ref 70–99)
Glucose-Capillary: 79 mg/dL (ref 70–99)
Glucose-Capillary: 227 mg/dL — ABNORMAL HIGH (ref 70–99)

## 2020-06-17 NOTE — Progress Notes (Signed)
PROGRESS NOTE    [name] [name]   YYT:[id number]  DOB: Jul 24, 1950  PCP: Dana Allan, MD    DOA: 06/06/2020 LOS: 10   Brief Narrative   Patient 70 year old female history of CHF(EF<20%), paroxysmal A. fib on chronic anticoagulation, hypertension, hyperlipidemia, IDDM type II, bipolar disorder recently hospitalized for acute embolic stroke requiring tPA and mechanical thrombectomy with hospital course complicated by cardiogenic shock/PEA cardiac arrest immediately following thrombectomy.  During that hospitalization patient required milrinone infusion and diuretics.  Patient also required vasopressors was extubated and weaned off pressors on 05/13/2020.  Patient status post TEE/DCCV on 5/9 and discharged to [facility] 5/16.  Patient presented to the ED from SNF with right-sided abdominal pain, nausea vomiting, shortness of breath with speaking.  Patient noted to have a leukocytosis with a white count of 12.9, hemoglobin of 9.3, platelet count of 493.  Patient pancultured.  Chest x-ray showed complete opacification of the left hemithorax.  CT chest concerning for large complex multiloculated left pleural effusion increased in size since prior CT of 05/12/2020 with differential concerning for empyema versus sequelae of previous hemothorax versus neoplasm.  IR consulted and pigtail catheter drain placed with 950 cc of bloody fluid aspirated.  PCCM consulted and patient with fibrinolysis 06/08/2020.  Cardiology also consulted for management of chronic complicated cardiac history.   5/28 admitted to [facility], 86fr chest tube placed by IR  TPA/DNAse fibrinolysis daily x 6 from 5/29-6/3  Chest tube removed 6/5    Assessment & Plan   Principal Problem:   Pleural effusion Active Problems:   HTN (hypertension)   Mixed hyperlipidemia   Uncontrolled type 2 diabetes mellitus with hyperglycemia (HCC)   A-fib (HCC)   Pleural effusion on left   Right upper quadrant abdominal pain   Chest pain   QT  prolongation   Large left multiloculated exudative pleural effusion Pt presented with abdominal pain/chest pain, shortness of breath with speaking. -PCCM/pulmonary following - see their recs -Exudative pleural effusion by lights criteria. -CT chest with large complex multilocular left pleural effusion increased in size since recent CT of the neck 05/12/2020. -IR consulted and pigtail drain catheter placed with 950 cc of bloody aspirate noted. -status post tPA/DNase on 5/29, 30, 31, 6/1, 6/2, 6/3 -Resumed on Eliquis 6/3 PM -Completed antibiotics 6/1 (treated w/Cefepime, Flagyl) -Chest tube removed 6/5  Right-sided abdominal pain/nausea and vomiting -Likely secondary to rib fractures and also possible problem #1 versus secondary to recent resuscitation and possible constipation -CT abdomen and pelvis with no acute intra-abdominal process. -Abdominal ultrasound unremarkable. -COVID-19 PCR negative, influenza A and B PCR negative. -Lipase within normal limits. -IV Toradol 15 mg every 6 hours as needed pain. -Oxycodone as needed. -Supportive care.  Hyperkalemia -initial a.m. reading of K7.0 (6/2) false, repeat 5.9. Given Lokelma 10 g once.  K still 5.9. Repeated Loklema - 6/4 6/5 potassium 5.1  No associated EKG changes. Stop Lokelma and repeat BMP in AM  Chest pain -Likely secondary to problem #1. -Cardiology consulted - feel patient's chest pain syndrome is noncardiac with flat troponin trend.  Recent embolic CVA -Status post tPA and mechanical thrombectomy during prior hospitalization. -Eliquis was held due to primary problem, bloody pleural fluid Resumed Eliquis 6/32 PM (tPA treatments completed)  Paroxysmal atrial fibrillation/status post TEE/ DCCV 5/9 -In NSR.   -Continue amiodarone.   -Eliquis resumed (was on hold during fibrinolysis)  QT prolongation -Concerning as patient noted to be on amiodarone. -Cardiology consulted: QT prolongation is normal on  amiodarone.  Nonischemic cardiomyopathy/chronic systolic heart failure -Norvasc, hydralazine, Imdur. -Not on spironolactone/ACE/ARB due to history of hyperkalemia. -Not on metoprolol due to allergy. -Not on Entresto or farxiga due to orthostasis -Per cardiology.  Hypertension -Continue Norvasc, hydralazine, Imdur.   -Hydralazine and Imdur have been uptitrated per cardiology for better blood pressure control. -Per cardiology.    Hyperlipidemia -Continue statin.    Poorly controlled insulin-dependent type 2 diabetes mellitus -Hemoglobin A1c 10.5 (05/13/2020) -Continue Lantus, SSI.  Titrate insulin for inpatient goal 140-180  Constipation -Continue MiraLAX twice daily -Placed on Senokot-S twice daily -Smog enema x1.   Patient BMI: Body mass index is 30.77 kg/m.   DVT prophylaxis: Place and maintain sequential compression device Start: 06/08/20 1841 Place and maintain sequential compression device Start: 06/07/20 1434   Diet:  Diet Orders (From admission, onward)    Start     Ordered   06/11/20 1030  Diet Heart Room service appropriate? Yes; Fluid consistency: Thin; Fluid restriction: 1500 mL Fluid  Diet effective now       Question Answer Comment  Room service appropriate? Yes   Fluid consistency: Thin   Fluid restriction: 1500 mL Fluid      06/11/20 1029            Code Status: Full Code    Subjective 06/17/20    Patient up in recliner.  Feels pretty good.  Pain better controlled with adding Robaxin for muscle spasms yesterday.  Says she is eating more (RN says she eats ~40% of her meals).  She is eager to go to rehab.   Disposition Plan & Communication   Status is: Inpatient  Remains inpatient appropriate because: pt requires placement for rehab prior to safely returning home.  CIR approval pending.   Dispo: The patient is from: Home              Anticipated d/c is to: CIR vs SNF              Patient currently is not medically stable to d/c.    Difficult to place patient No   Consults, Procedures, Significant Events   Consultants:   Pulmonology  Cardiology  Interventional radiology  Procedures:   CT chest 06/07/2020  CT abdomen and pelvis 06/06/2020  Chest x-ray 06/06/2020, 06/08/2020  Abdominal ultrasound 06/07/2020  CT-guided placement of 14 French all-purpose drain catheter into left pleural space with aspiration of 950 cc of bloody fluid--- Per Dr. Grace Isaac, IR 06/07/2020  Pleural fibrinolysis x 6 days  Antimicrobials:  Anti-infectives (From admission, onward)   Start     Dose/Rate Route Frequency Ordered Stop   06/10/20 1400  metroNIDAZOLE (FLAGYL) tablet 500 mg  Status:  Discontinued        500 mg Oral Every 8 hours 06/10/20 1142 06/12/20 1356   06/08/20 0000  vancomycin (VANCOCIN) IVPB 1000 mg/200 mL premix  Status:  Discontinued        1,000 mg 200 mL/hr over 60 Minutes Intravenous Every 24 hours 06/07/20 0647 06/09/20 0800   06/07/20 1000  metroNIDAZOLE (FLAGYL) IVPB 500 mg  Status:  Discontinued        500 mg 100 mL/hr over 60 Minutes Intravenous Every 8 hours 06/07/20 0635 06/10/20 1142   06/07/20 1000  ceFEPIme (MAXIPIME) 2 g in sodium chloride 0.9 % 100 mL IVPB  Status:  Discontinued        2 g 200 mL/hr over 30 Minutes Intravenous Every 8 hours 06/07/20 0647 06/12/20 1356   06/07/20 0115  vancomycin (  VANCOCIN) IVPB 1000 mg/200 mL premix        1,000 mg 200 mL/hr over 60 Minutes Intravenous  Once 06/07/20 0114 06/07/20 0512   06/07/20 0115  ceFEPIme (MAXIPIME) 1 g in sodium chloride 0.9 % 100 mL IVPB  Status:  Discontinued        1 g 200 mL/hr over 30 Minutes Intravenous  Once 06/07/20 0114 06/07/20 0114   06/07/20 0115  metroNIDAZOLE (FLAGYL) IVPB 500 mg        500 mg 100 mL/hr over 60 Minutes Intravenous  Once 06/07/20 0114 06/07/20 0512   06/07/20 0115  ceFEPIme (MAXIPIME) 2 g in sodium chloride 0.9 % 100 mL IVPB        2 g 200 mL/hr over 30 Minutes Intravenous  Once 06/07/20 0114 06/07/20 0512         Micro    Objective   Vitals:   06/16/20 1122 06/16/20 2039 06/17/20 0546 06/17/20 1314  BP: 124/62 129/79 140/66 (!) 112/53  Pulse: 80 79 79 73  Resp: (!) 24 18 18 18   Temp: 98.2 F (36.8 C) 97.7 F (36.5 C) 98.2 F (36.8 C) 97.7 F (36.5 C)  TempSrc: Oral  Oral Oral  SpO2: 97% 98% 96% 98%  Weight:   78.8 kg   Height:        Intake/Output Summary (Last 24 hours) at 06/17/2020 1837 Last data filed at 06/17/2020 1700 Gross per 24 hour  Intake 444 ml  Output 1600 ml  Net -1156 ml   Filed Weights   06/14/20 0342 06/15/20 0500 06/17/20 0546  Weight: 77.6 kg 79.2 kg 78.8 kg    Physical Exam:  General exam: awake, alert, no acute distress, in recliner Respiratory system: normal respiratory effort, on room air Cardiovascular system: RRR, no peripheral edema Central nervous system: A&O x3. Grossly non-focal exam, normal speech Extremities: no edema, normal tone, moves all  Labs   Data Reviewed: I have personally reviewed following labs and imaging studies  CBC: Recent Labs  Lab 06/11/20 0440 06/12/20 0457 06/13/20 0504 06/14/20 0535  WBC 7.8 7.9 9.2 7.5  HGB 9.3* 10.0* 9.0* 9.7*  HCT 30.9* 32.6* 28.8* 30.8*  MCV 82.0 80.5 79.6* 78.0*  PLT 589* 529* 579* 524*   Basic Metabolic Panel: Recent Labs  Lab 06/12/20 0725 06/12/20 1029 06/13/20 0504 06/13/20 1720 06/14/20 0535 06/15/20 0506 06/16/20 0504  NA 133*  --  134*  --  133* 136 135  K 5.9*  --  5.9* 5.7* 5.1 4.8 4.3  CL 102  --  102  --  101 101 102  CO2 28  --  26  --  26 30 29   GLUCOSE 111*  --  128*  --  66* 94 92  BUN 19  --  20  --  16 12 11   CREATININE 0.68  --  0.79  --  0.57 0.74 0.46  CALCIUM 8.4*  --  8.7*  --  8.6* 8.4* 8.1*  MG  --  1.7  --   --   --   --   --    GFR: Estimated Creatinine Clearance: 66.9 mL/min (by C-G formula based on SCr of 0.46 mg/dL). Liver Function Tests: No results for input(s): AST, ALT, ALKPHOS, BILITOT, PROT, ALBUMIN in the last 168 hours. No results  for input(s): LIPASE, AMYLASE in the last 168 hours. No results for input(s): AMMONIA in the last 168 hours. Coagulation Profile: No results for input(s): INR, PROTIME in the last 168  hours. Cardiac Enzymes: Recent Labs  Lab 06/13/20 1720  CKTOTAL 255*   BNP (last 3 results) No results for input(s): PROBNP in the last 8760 hours. HbA1C: No results for input(s): HGBA1C in the last 72 hours. CBG: Recent Labs  Lab 06/16/20 2207 06/17/20 0720 06/17/20 1151 06/17/20 1632 06/17/20 1653  GLUCAP 143* 83 208* 51* 79   Lipid Profile: No results for input(s): CHOL, HDL, LDLCALC, TRIG, CHOLHDL, LDLDIRECT in the last 72 hours. Thyroid Function Tests: No results for input(s): TSH, T4TOTAL, FREET4, T3FREE, THYROIDAB in the last 72 hours. Anemia Panel: No results for input(s): VITAMINB12, FOLATE, FERRITIN, TIBC, IRON, RETICCTPCT in the last 72 hours. Sepsis Labs: No results for input(s): PROCALCITON, LATICACIDVEN in the last 168 hours.  Recent Results (from the past 240 hour(s))  MRSA PCR Screening     Status: None   Collection Time: 06/09/20  8:12 AM   Specimen: Nasal Mucosa; Nasopharyngeal  Result Value Ref Range Status   MRSA by PCR NEGATIVE NEGATIVE Final    Comment:        The GeneXpert MRSA Assay (FDA approved for NASAL specimens only), is one component of a comprehensive MRSA colonization surveillance program. It is not intended to diagnose MRSA infection nor to guide or monitor treatment for MRSA infections. Performed at [facility], [address], [place], [place] [address]       Imaging Studies   No results found.   Medications   Scheduled Meds: . amiodarone  200 mg Oral Daily  . apixaban  5 mg Oral BID  . atorvastatin  80 mg Oral Daily  . feeding supplement  1 Container Oral Q24H  . ferrous gluconate  324 mg Oral Daily  . insulin aspart  0-5 Units Subcutaneous QHS  . insulin aspart  0-9 Units Subcutaneous TID WC  . insulin aspart  3  Units Subcutaneous TID WC  . insulin glargine  17 Units Subcutaneous Daily  . isosorbide-hydrALAZINE  2 tablet Oral TID  . magnesium oxide  400 mg Oral Daily  . polyethylene glycol  17 g Oral BID  . scopolamine  1 patch Transdermal Q72H  . senna-docusate  1 tablet Oral BID   Continuous Infusions: . sodium chloride Stopped (06/12/20 0350)       LOS: 10 days    Time spent: 20 minutes    Pennie Banter, DO [facility]  06/17/2020, 6:37 PM      If 7PM-7AM, please contact night-coverage. How to contact the [facility] Attending or Consulting provider 7A - 7P or covering provider during after hours 7P -7A, for this patient?    1. Check the care team in [facility] and look for a) attending/consulting [facility] provider listed and b) the [facility] team listed 2. Log into [email or web address] and use 's universal password to access. If you do not have the password, please contact the hospital operator. 3. Locate the [facility] provider you are looking for under [facility] and page to a number that you can be directly reached. 4. If you still have difficulty reaching the provider, please page the [facility] (Director on Call) for the Hospitalists listed on amion for assistance.

## 2020-06-17 NOTE — TOC Transition Note (Addendum)
Transition of Care [facility]) - CM/SW Discharge Note   Patient Details  Name: [name] [name] MRN: [id number] Date of Birth: [birth year]  Transition of [facility]) CM/SW Contact:  Lanier Clam, RN Phone Number: 06/17/2020, 9:28 AM   Clinical Narrative:  D/c plan CIR-rep Barrie Folk.Family agrees to CIR-dtr [facility] aware.MD updated.  9:41a-Per CIR rep-Laura-she will follow now for auth.    Final next level of care: IP Rehab Facility Barriers to Discharge: No Barriers Identified   Patient Goals and CMS Choice Patient states their goals for this hospitalization and ongoing recovery are:: go home CMS Medicare.gov Compare Post Acute Care list provided to:: Patient Represenative (must comment) ([name] (dtr) wants HHC.) Choice offered to / list presented to : Adult Children  Discharge Placement                       Discharge Plan and Services   Discharge Planning Services: CM Consult Post Acute Care Choice: Home Health                               Social Determinants of Health (SDOH) Interventions     Readmission Risk Interventions Readmission Risk Prevention Plan 02/07/2020  Post Dischage Appt Complete  Medication Screening Complete  Transportation Screening Complete  Some recent data might be hidden

## 2020-06-17 NOTE — Progress Notes (Signed)
Inpatient Rehab Admissions Coordinator:   Spoke with Pt. Over the phone to discuss potential CIR admission. Pt. Stated interest. Will pursue for potential admit pending insurance auth and bed availability. I also sent a message to her daughter Roddie Mc with request for callback.   Megan Salon, MS, CCC-SLP Rehab Admissions Coordinator  [phone] (celll) [phone] (office)

## 2020-06-17 NOTE — Progress Notes (Signed)
Occupational Therapy Treatment Patient Details Name: [name] [name] MRN: [id number] DOB: Aug 06, 1950 Today's Date: 06/17/2020    History of present illness 70 yo female admited with "large/complicated" pleural effusion, L hemithorax s/p chest tube placement. CT removed 6/5Recent last admit-(CVA, post procedure cardiac arrest, cardiogenic shock, DCCV). Hx of DM, Afib, COVID, schizophrenia, bipolar d/o, CHF   OT comments  Patient reports hoping to go to CIR to rehab. Min G assist with cues for hand placement to stand from edge of bed and ambulate into bathroom. Cues for use of grab bar to sit onto toilet. Patient wash face seated, min A to power up to standing and cues to use grab bar. Min G for balance for perianal care, decreased safety at sink side to wash hands due to fatigue. O2 reading 93% on room air post ambulation from bathroom. Will continue to follow acutely, patient motivated to get well to get home.    Follow Up Recommendations  CIR    Equipment Recommendations  None recommended by OT       Precautions / Restrictions Precautions Precautions: Fall       Mobility Bed Mobility Overal bed mobility: Needs Assistance Bed Mobility: Supine to Sit     Supine to sit: Min guard;HOB elevated     General bed mobility comments: min G for safety, increased time    Transfers Overall transfer level: Needs assistance Equipment used: Rolling walker (2 wheeled) Transfers: Sit to/from Stand Sit to Stand: Min guard;Min assist         General transfer comment: min G to power up to standing from edge of bed, light min A from lower toilet in bathroom. needs cues for hand placement not to pull on walker    Balance Overall balance assessment: Needs assistance Sitting-balance support: Feet supported Sitting balance-Leahy Scale: Fair     Standing balance support: Single extremity supported;Bilateral upper extremity supported Standing balance-Leahy Scale: Poor                              ADL either performed or assessed with clinical judgement   ADL Overall ADL's : Needs assistance/impaired     Grooming: Wash/dry hands;Wash/dry face;Sitting;Standing Grooming Details (indicate cue type and reason): limited safety at sink side to wash hands                 Toilet Transfer: Minimal assistance;RW;Ambulation;Regular Toilet;Grab bars;Cueing for safety Toilet Transfer Details (indicate cue type and reason): verbal cues to use grab bar to assist with sit <> stand, light min A to power up to standing from toilet. needs increased time Toileting- Architect and Hygiene: Min guard;Sit to/from stand Toileting - Clothing Manipulation Details (indicate cue type and reason): instructed patient to wash perianal area, note pure wick quite soiled when removed. min G for safety in standing.     Functional mobility during ADLs: Min guard;Rolling walker;Cueing for safety General ADL Comments: patient limited by fatigue, note O2 93% on room air post ambulation from bathroom               Cognition Arousal/Alertness: Awake/alert Behavior During Therapy: [facility] for tasks assessed/performed;Flat affect Overall Cognitive Status: Within Functional Limits for tasks assessed  Pertinent Vitals/ Pain       Pain Assessment: Faces Faces Pain Scale: No hurt         Frequency  Min 2X/week        Progress Toward Goals  OT Goals(current goals can now be found in the care plan section)  Progress towards OT goals: Progressing toward goals  Acute Rehab OT Goals Patient Stated Goal: get home OT Goal Formulation: With patient Time For Goal Achievement: 06/24/20 Potential to Achieve Goals: Good ADL Goals Pt Will Perform Grooming: with supervision;standing Pt Will Perform Upper Body Dressing: with set-up;sitting Pt Will Perform Lower Body Dressing: with supervision;sit to/from  stand Pt Will Transfer to Toilet: with supervision;ambulating Pt Will Perform Toileting - Clothing Manipulation and hygiene: with supervision;sit to/from stand  Plan Discharge plan needs to be updated       AM-PAC OT "6 Clicks" Daily Activity     Outcome Measure   Help from another person eating meals?: None Help from another person taking care of personal grooming?: A Little Help from another person toileting, which includes using toliet, bedpan, or urinal?: A Little Help from another person bathing (including washing, rinsing, drying)?: A Little Help from another person to put on and taking off regular upper body clothing?: A Little Help from another person to put on and taking off regular lower body clothing?: A Lot 6 Click Score: 18    End of Session Equipment Utilized During Treatment: Rolling walker  OT Visit Diagnosis: Unsteadiness on feet (R26.81);Other abnormalities of gait and mobility (R26.89);Muscle weakness (generalized) (M62.81)   Activity Tolerance Patient tolerated treatment well   Patient Left in chair;with call bell/phone within reach;with chair alarm set   Nurse Communication Mobility status        Time: 0812-0830 OT Time Calculation (min): 18 min  Charges: OT General Charges $OT Visit: 1 Visit OT Treatments $Self Care/Home Management : 8-22 mins  Marlyce Huge OT OT pager: [phone]   Carmelia Roller 06/17/2020, 9:17 AM

## 2020-06-17 NOTE — Progress Notes (Signed)
Evening CBG was 51. Initiated hypoglycemia protocol. Recheck CBG was 79.

## 2020-06-18 LAB — GLUCOSE, CAPILLARY
Glucose-Capillary: 80 mg/dL (ref 70–99)
Glucose-Capillary: 197 mg/dL — ABNORMAL HIGH (ref 70–99)
Glucose-Capillary: 161 mg/dL — ABNORMAL HIGH (ref 70–99)
Glucose-Capillary: 79 mg/dL (ref 70–99)

## 2020-06-18 MED ORDER — INSULIN GLARGINE 100 UNIT/ML ~~LOC~~ SOLN
15.0000 [IU] | Freq: Every day | SUBCUTANEOUS | Status: DC
  Administered 2020-06-19 – 2020-06-21 (×3): 15 [IU] via SUBCUTANEOUS
  Filled 2020-06-18 (×3): qty 0.15

## 2020-06-18 MED ORDER — FLUCONAZOLE 150 MG PO TABS
150.0000 mg | ORAL_TABLET | Freq: Once | ORAL | Status: AC
  Administered 2020-06-18: 150 mg via ORAL
  Filled 2020-06-18: qty 1

## 2020-06-18 MED ORDER — INSULIN ASPART 100 UNIT/ML IJ SOLN
2.0000 [IU] | Freq: Three times a day (TID) | INTRAMUSCULAR | Status: DC
  Administered 2020-06-18 – 2020-06-21 (×8): 2 [IU] via SUBCUTANEOUS

## 2020-06-18 NOTE — Progress Notes (Signed)
Inpatient Rehab Admissions Coordinator:   I opened a case with Pt.s insurance for CIR admit yesterday and continue to await a response.I do not yet have auth or a bed on CIR for Pt. Today but I will follow for potential admit pending insurance auth and bed availability.  Megan Salon, MS, CCC-SLP Rehab Admissions Coordinator  [phone] (celll) [phone] (office)

## 2020-06-18 NOTE — Progress Notes (Signed)
Inpatient Diabetes Program Recommendations  AACE/ADA: New Consensus Statement on Inpatient Glycemic Control (2015)  Target Ranges:  Prepandial:   less than 140 mg/dL      Peak postprandial:   less than 180 mg/dL (1-2 hours)      Critically ill patients:  140 - 180 mg/dL   Lab Results  Component Value Date   GLUCAP 80 06/18/2020   HGBA1C 10.5 (H) 05/13/2020    Review of Glycemic Control Results for [name] [name], [name] [name] (MRN [id number]) as of 06/18/2020 10:46  Ref. Range 06/17/2020 07:20 06/17/2020 11:51 06/17/2020 16:32 06/17/2020 16:53 06/17/2020 20:46 06/18/2020 07:43  Glucose-Capillary Latest Ref Range: 70 - 99 mg/dL 83 979 (H) 51 (L) 79 480 (H) 80   Diabetes history: DM 2 Outpatient Diabetes medications: Lantus 15 units, Novolog 0-14 units tid Current orders for Inpatient glycemic control:  Lantus 17 units Novolog 3 units tid meal coverage Novolog 0-9 units tid + hs  Inpatient Diabetes Program Recommendations:   -Decrease Lantus to 15 units daily -Decrease Novolog meal coverage to 2 units tid if eats 50% Secure chat sent to Dr. Lucianne Muss.  Thank you, Billy Fischer. Yonatan Guitron, RN, MSN, CDE  Diabetes Coordinator Inpatient Glycemic Control Team Team Pager [phone] (8am-5pm) 06/18/2020 10:48 AM

## 2020-06-18 NOTE — Progress Notes (Signed)
PROGRESS NOTE    [name] [name]  CVE:[id number] DOB: 07/08/1950 DOA: 06/06/2020 PCP: Dana Allan, MD   Brief Narrative: This 70 year old female with PMH significant of CHF(EF<20%), paroxysmal A. fib on chronic anticoagulation, hypertension, hyperlipidemia, IDDM type II, bipolar disorder recently hospitalized for acute embolic stroke requiring tPA and mechanical thrombectomy with hospital course complicated by cardiogenic shock/PEA cardiac arrest immediately following thrombectomy. During that hospitalization patient required milrinone infusion and diuretics. Patient also required vasopressors was extubated and weaned off pressors on 05/13/2020. Patient status post TEE/DCCV on 5/9 and discharged to [facility] on  5/16. Patient presented to the ED from SNF with right-sided abdominal pain, nausea,  vomiting, shortness of breath with speaking. Patient noted to have a leukocytosis with a white count of 12.9, hemoglobin of 9.3, platelet count of 493. Patient pancultured. Chest x-ray showed complete opacification of the left hemithorax. CT chest concerning for large complex multiloculated left pleural effusion increased in size since prior CT of 05/12/2020 with differential concerning for empyema versus sequelae of previous hemothorax versus neoplasm. IR consulted and pigtail catheter drain placed with 950 cc of bloody fluid aspirated. PCCM consulted and patient had fibrinolysis 06/08/2020. Cardiology also consulted for management of chronic complicated cardiac history.   5/28 admitted to [facility], 66fr chest tube placed by IR  TPA/DNAse fibrinolysis daily x 6 from 5/29-6/3  Chest tube removed 6/5   Assessment & Plan:   Principal Problem:   Pleural effusion Active Problems:   HTN (hypertension)   Mixed hyperlipidemia   Uncontrolled type 2 diabetes mellitus with hyperglycemia (HCC)   A-fib (HCC)   Pleural effusion on left   Right upper quadrant abdominal pain   Chest pain   QT  prolongation  Large left multiloculated exudative pleural effusion : Patient presented with abdominal pain/chest pain shortness of breath while talking Found to have exudative pleural effusion per lights criteria. PCCM consulted. CT chest with large complex multilocular left pleural effusion increased in size since recent CT of the neck 05/12/2020. IR consulted and pigtail drain catheter placed with 950 cc of bloody aspirate noted. status post tPA/DNase on 5/29, 30, 31, 6/1, 6/2, 6/3 Resumed on Eliquis 06/13/20 She has completed antibiotics 6/1 (treated w/Cefepime, Flagyl) Chest tube removed 6/5.  She appears stable and comfortable.  Right-sided abdominal pain: > Improving most likely secondary to rib fractures and possible effusion. CT abdomen and pelvis with no acute intra-abdominal process. Abdominal ultrasound unremarkable. COVID-19 PCR negative, influenza A and B PCR negative. Lipase within normal limits. Continue IV Toradol 15 mg every 6 hours as needed pain. Continue Oxycodone as needed.   Hyperkalemia : > Resolved. No associated EKG changes. Treated with Lokelma.  Chest pain : > Improving Likely secondary to rib fractures and pleural effusion. Cardiology consulted - feel patient's chest pain syndrome is noncardiac with flat troponin trend.  Recent embolic CVA Status post tPA and mechanical thrombectomy during prior hospitalization. Eliquis was held due to primary problem, bloody pleural fluid , Resumed Eliquis 6/32 PM (tPA treatments completed)  Paroxysmal atrial fibrillation/status post TEE/ DCCV 5/9 Remains in normal sinus rhythm Continue amiodarone.  Eliquis resumed (was on hold during fibrinolysis)  QT prolongation Concerning as patient noted to be on amiodarone. Cardiology consulted:QT prolongation is normal on amiodarone.  Nonischemic cardiomyopathy/chronic systolic heart failure -Not on spironolactone/ACE/ARB due to history of hyperkalemia. -Not on  metoprolol due to allergy. -Not on Entresto or farxiga due to orthostasis.   Hypertension Continue Norvasc, hydralazine, Imdur.  Hydralazine and Imdur have been  uptitrated per cardiology for better blood pressure control.   Hyperlipidemia Continue statin.   Poorly controlled insulin-dependent type 2 diabetes mellitus Hemoglobin A1c 10.5 (05/13/2020) Continue Lantus, SSI.  Titrate insulin for inpatient goal 140-180  Constipation Continue MiraLAX twice daily Placed on Senokot-S twice daily Smog enema x1.    DVT prophylaxis: Eliquis Code Status: Full code. Family Communication: No family at bed side. Disposition Plan:  Status is: Inpatient  Remains inpatient appropriate because:Inpatient level of care appropriate due to severity of illness   Dispo: The patient is from: Home              Anticipated d/c is to: CIR vs SNF              Patient currently is not medically stable to d/c.   Difficult to place patient No   Consultants:   IR  PCCM  Cardiology  Procedures:   CT chest 06/07/2020  CT abdomen and pelvis 06/06/2020  Chest x-ray 06/06/2020, 06/08/2020  Abdominal ultrasound 06/07/2020  CT-guided placement of 14 French all-purpose drain catheter into left pleural space with aspiration of 950 cc of bloody fluid--- Per Dr. Grace Isaac, IR 06/07/2020  Pleural fibrinolysis x 6 days   Antimicrobials:  Anti-infectives (From admission, onward)   Start     Dose/Rate Route Frequency Ordered Stop   06/10/20 1400  metroNIDAZOLE (FLAGYL) tablet 500 mg  Status:  Discontinued        500 mg Oral Every 8 hours 06/10/20 1142 06/12/20 1356   06/08/20 0000  vancomycin (VANCOCIN) IVPB 1000 mg/200 mL premix  Status:  Discontinued        1,000 mg 200 mL/hr over 60 Minutes Intravenous Every 24 hours 06/07/20 0647 06/09/20 0800   06/07/20 1000  metroNIDAZOLE (FLAGYL) IVPB 500 mg  Status:  Discontinued        500 mg 100 mL/hr over 60 Minutes Intravenous Every 8 hours 06/07/20  0635 06/10/20 1142   06/07/20 1000  ceFEPIme (MAXIPIME) 2 g in sodium chloride 0.9 % 100 mL IVPB  Status:  Discontinued        2 g 200 mL/hr over 30 Minutes Intravenous Every 8 hours 06/07/20 0647 06/12/20 1356   06/07/20 0115  vancomycin (VANCOCIN) IVPB 1000 mg/200 mL premix        1,000 mg 200 mL/hr over 60 Minutes Intravenous  Once 06/07/20 0114 06/07/20 0512   06/07/20 0115  ceFEPIme (MAXIPIME) 1 g in sodium chloride 0.9 % 100 mL IVPB  Status:  Discontinued        1 g 200 mL/hr over 30 Minutes Intravenous  Once 06/07/20 0114 06/07/20 0114   06/07/20 0115  metroNIDAZOLE (FLAGYL) IVPB 500 mg        500 mg 100 mL/hr over 60 Minutes Intravenous  Once 06/07/20 0114 06/07/20 0512   06/07/20 0115  ceFEPIme (MAXIPIME) 2 g in sodium chloride 0.9 % 100 mL IVPB        2 g 200 mL/hr over 30 Minutes Intravenous  Once 06/07/20 0114 06/07/20 8527       Subjective: Patient was seen and examined at bedside.  Overnight events noted.   She is lying comfortably on the bed,  denies any pain,  shortness of breath.  Patient is awaiting CIR.  Objective: Vitals:   06/17/20 1314 06/17/20 2039 06/18/20 0430 06/18/20 1234  BP: (!) 112/53 129/69 (!) 147/74 (!) 124/51  Pulse: 73 69 74 76  Resp: 18 18 15 18   Temp: 97.7 F (36.5 C)  98.3 F (36.8 C) 98.3 F (36.8 C) 98.2 F (36.8 C)  TempSrc: Oral Oral Oral Oral  SpO2: 98% 95% 97% 96%  Weight:   78.5 kg   Height:        Intake/Output Summary (Last 24 hours) at 06/18/2020 1426 Last data filed at 06/18/2020 0436 Gross per 24 hour  Intake 942 ml  Output 1550 ml  Net -608 ml   Filed Weights   06/15/20 0500 06/17/20 0546 06/18/20 0430  Weight: 79.2 kg 78.8 kg 78.5 kg    Examination:  General exam: Appears calm and comfortable, not in any acute distress. Respiratory system: Clear to auscultation. Respiratory effort normal. Cardiovascular system: S1 & S2 heard, RRR. No JVD, murmurs, rubs, gallops or clicks. No pedal edema. Gastrointestinal system:  Abdomen is nondistended, soft and nontender. No organomegaly or masses felt.  Normal bowel sounds heard. Central nervous system: Alert and oriented. No focal neurological deficits. Extremities: Symmetric 5 x 5 power.  No edema, no cyanosis, no clubbing. Skin: No rashes, lesions or ulcers Psychiatry: Judgement and insight appear normal. Mood & affect appropriate.     Data Reviewed: I have personally reviewed following labs and imaging studies  CBC: Recent Labs  Lab 06/12/20 0457 06/13/20 0504 06/14/20 0535  WBC 7.9 9.2 7.5  HGB 10.0* 9.0* 9.7*  HCT 32.6* 28.8* 30.8*  MCV 80.5 79.6* 78.0*  PLT 529* 579* 524*   Basic Metabolic Panel: Recent Labs  Lab 06/12/20 0725 06/12/20 1029 06/13/20 0504 06/13/20 1720 06/14/20 0535 06/15/20 0506 06/16/20 0504  NA 133*  --  134*  --  133* 136 135  K 5.9*  --  5.9* 5.7* 5.1 4.8 4.3  CL 102  --  102  --  101 101 102  CO2 28  --  26  --  26 30 29   GLUCOSE 111*  --  128*  --  66* 94 92  BUN 19  --  20  --  16 12 11   CREATININE 0.68  --  0.79  --  0.57 0.74 0.46  CALCIUM 8.4*  --  8.7*  --  8.6* 8.4* 8.1*  MG  --  1.7  --   --   --   --   --    GFR: Estimated Creatinine Clearance: 66.7 mL/min (by C-G formula based on SCr of 0.46 mg/dL). Liver Function Tests: No results for input(s): AST, ALT, ALKPHOS, BILITOT, PROT, ALBUMIN in the last 168 hours. No results for input(s): LIPASE, AMYLASE in the last 168 hours. No results for input(s): AMMONIA in the last 168 hours. Coagulation Profile: No results for input(s): INR, PROTIME in the last 168 hours. Cardiac Enzymes: Recent Labs  Lab 06/13/20 1720  CKTOTAL 255*   BNP (last 3 results) No results for input(s): PROBNP in the last 8760 hours. HbA1C: No results for input(s): HGBA1C in the last 72 hours. CBG: Recent Labs  Lab 06/17/20 1632 06/17/20 1653 06/17/20 2046 06/18/20 0743 06/18/20 1202  GLUCAP 51* 79 227* 80 197*   Lipid Profile: No results for input(s): CHOL, HDL,  LDLCALC, TRIG, CHOLHDL, LDLDIRECT in the last 72 hours. Thyroid Function Tests: No results for input(s): TSH, T4TOTAL, FREET4, T3FREE, THYROIDAB in the last 72 hours. Anemia Panel: No results for input(s): VITAMINB12, FOLATE, FERRITIN, TIBC, IRON, RETICCTPCT in the last 72 hours. Sepsis Labs: No results for input(s): PROCALCITON, LATICACIDVEN in the last 168 hours.  Recent Results (from the past 240 hour(s))  MRSA PCR Screening     Status:  None   Collection Time: 06/09/20  8:12 AM   Specimen: Nasal Mucosa; Nasopharyngeal  Result Value Ref Range Status   MRSA by PCR NEGATIVE NEGATIVE Final    Comment:        The GeneXpert MRSA Assay (FDA approved for NASAL specimens only), is one component of a comprehensive MRSA colonization surveillance program. It is not intended to diagnose MRSA infection nor to guide or monitor treatment for MRSA infections. Performed at [facility], [address], [place], [place] [address]      Radiology Studies: No results found.   Scheduled Meds: . amiodarone  200 mg Oral Daily  . apixaban  5 mg Oral BID  . atorvastatin  80 mg Oral Daily  . feeding supplement  1 Container Oral Q24H  . ferrous gluconate  324 mg Oral Daily  . insulin aspart  0-5 Units Subcutaneous QHS  . insulin aspart  0-9 Units Subcutaneous TID WC  . insulin aspart  2 Units Subcutaneous TID WC  . [START ON 06/19/2020] insulin glargine  15 Units Subcutaneous Daily  . isosorbide-hydrALAZINE  2 tablet Oral TID  . magnesium oxide  400 mg Oral Daily  . polyethylene glycol  17 g Oral BID  . scopolamine  1 patch Transdermal Q72H  . senna-docusate  1 tablet Oral BID   Continuous Infusions: . sodium chloride Stopped (06/12/20 0350)     LOS: 11 days    Time spent: 35 mins    Tae Robak, MD [facility]   If 7PM-7AM, please contact night-coverage

## 2020-06-19 LAB — CBC
HCT: 32.4 % — ABNORMAL LOW (ref 36.0–46.0)
Hemoglobin: 9.9 g/dL — ABNORMAL LOW (ref 12.0–15.0)
MCH: 24.7 pg — ABNORMAL LOW (ref 26.0–34.0)
MCHC: 30.6 g/dL (ref 30.0–36.0)
MCV: 80.8 fL (ref 80.0–100.0)
Platelets: 491 10*3/uL — ABNORMAL HIGH (ref 150–400)
RBC: 4.01 MIL/uL (ref 3.87–5.11)
RDW: 19 % — ABNORMAL HIGH (ref 11.5–15.5)
WBC: 7.1 10*3/uL (ref 4.0–10.5)
nRBC: 0 % (ref 0.0–0.2)

## 2020-06-19 LAB — BASIC METABOLIC PANEL
Anion gap: 6 (ref 5–15)
BUN: 7 mg/dL — ABNORMAL LOW (ref 8–23)
CO2: 26 mmol/L (ref 22–32)
Calcium: 8.5 mg/dL — ABNORMAL LOW (ref 8.9–10.3)
Chloride: 102 mmol/L (ref 98–111)
Creatinine, Ser: 0.64 mg/dL (ref 0.44–1.00)
GFR, Estimated: >60 mL/min (ref >60)
Glucose, Bld: 90 mg/dL (ref 70–99)
Potassium: 4.4 mmol/L (ref 3.5–5.1)
Sodium: 134 mmol/L — ABNORMAL LOW (ref 135–145)

## 2020-06-19 LAB — PHOSPHORUS: Phosphorus: 3.4 mg/dL (ref 2.5–4.6)

## 2020-06-19 LAB — GLUCOSE, CAPILLARY
Glucose-Capillary: 118 mg/dL — ABNORMAL HIGH (ref 70–99)
Glucose-Capillary: 109 mg/dL — ABNORMAL HIGH (ref 70–99)
Glucose-Capillary: 208 mg/dL — ABNORMAL HIGH (ref 70–99)
Glucose-Capillary: 137 mg/dL — ABNORMAL HIGH (ref 70–99)

## 2020-06-19 LAB — MAGNESIUM: Magnesium: 1.6 mg/dL — ABNORMAL LOW (ref 1.7–2.4)

## 2020-06-19 MED ORDER — MAGNESIUM SULFATE 2 GM/50ML IV SOLN
2.0000 g | Freq: Once | INTRAVENOUS | Status: AC
  Administered 2020-06-19: 2 g via INTRAVENOUS
  Filled 2020-06-19: qty 50

## 2020-06-19 MED ORDER — ONDANSETRON HCL 4 MG/2ML IJ SOLN
4.0000 mg | Freq: Once | INTRAMUSCULAR | Status: AC
  Administered 2020-06-20: 4 mg via INTRAVENOUS
  Filled 2020-06-19: qty 2

## 2020-06-19 NOTE — Progress Notes (Signed)
PROGRESS NOTE    [name] [name]  RSW:[id number] DOB: May 05, 1950 DOA: 06/06/2020 PCP: Dana Allan, MD   Brief Narrative: This 70 year old female with PMH significant of CHF(EF<20%), paroxysmal A. fib on chronic anticoagulation, hypertension, hyperlipidemia, IDDM type II, bipolar disorder recently hospitalized for acute embolic stroke requiring tPA and mechanical thrombectomy with hospital course complicated by cardiogenic shock/PEA cardiac arrest immediately following thrombectomy.  During that hospitalization patient required milrinone infusion and diuretics.  Patient also required vasopressors was extubated and weaned off pressors on 05/13/2020.  Patient status post TEE/DCCV on 5/9 and discharged to [facility] on  5/16.  Patient presented to the ED from SNF with right-sided abdominal pain, nausea,  vomiting, shortness of breath with speaking. Patient noted to have a leukocytosis with a white count of 12.9, hemoglobin of 9.3, platelet count of 493.  Patient pancultured.  Chest x-ray showed complete opacification of the left hemithorax.  CT chest concerning for large complex multiloculated left pleural effusion increased in size since prior CT of 05/12/2020 with differential concerning for empyema versus sequelae of previous hemothorax versus neoplasm.  IR consulted and pigtail catheter drain placed with 950 cc of bloody fluid aspirated.  PCCM consulted and patient had fibrinolysis 06/08/2020.  Cardiology also consulted for management of chronic complicated cardiac history.   5/28 admitted to [facility], 89fr chest tube placed by IR TPA/DNAse fibrinolysis daily x 6 from 5/29-6/3 Chest tube removed 6/5   Assessment & Plan:   Principal Problem:   Pleural effusion Active Problems:   HTN (hypertension)   Mixed hyperlipidemia   Uncontrolled type 2 diabetes mellitus with hyperglycemia (HCC)   A-fib (HCC)   Pleural effusion on left   Right upper quadrant abdominal pain   Chest pain   QT  prolongation  Large left multiloculated exudative pleural effusion : Patient presented with abdominal pain/chest pain,  shortness of breath while talking Found to have exudative pleural effusion per lights criteria. PCCM consulted. CT chest with large complex multilocular left pleural effusion increased in size since recent CT of the neck 05/12/2020. IR consulted and pigtail drain catheter placed with 950 cc of bloody aspirate noted. status post tPA/DNase on 5/29, 30, 31, 6/1, 6/2, 6/3 Resumed on Eliquis 06/13/20 She has completed antibiotics 6/1 (treated w/Cefepime, Flagyl) Chest tube removed 6/5.  She appears stable and comfortable.  Right-sided abdominal pain: > Improving most likely secondary to rib fractures and possible effusion. CT abdomen and pelvis with no acute intra-abdominal process. Abdominal ultrasound unremarkable. COVID-19 PCR negative, influenza A and B PCR negative. Lipase within normal limits. Continue IV Toradol 15 mg every 6 hours as needed pain. Continue Oxycodone as needed.    Hyperkalemia : > Resolved. No associated EKG changes. Treated with Lokelma.   Chest pain : > Improving Likely secondary to rib fractures and pleural effusion. Cardiology consulted - feel patient's chest pain syndrome is noncardiac with flat troponin trend.   Recent embolic CVA Status post tPA and mechanical thrombectomy during prior hospitalization. Eliquis was held due to primary problem, bloody pleural fluid , Resumed Eliquis 6/3 (tPA treatments completed)   Paroxysmal atrial fibrillation/status post TEE/ DCCV 5/9 Remains in normal sinus rhythm Continue amiodarone.   Eliquis resumed (was on hold during fibrinolysis)   QT prolongation Concerning as patient noted to be on amiodarone. Cardiology consulted: QT prolongation is normal on amiodarone.   Nonischemic cardiomyopathy/chronic systolic heart failure -Not on spironolactone/ACE/ARB due to history of hyperkalemia. -Not on  metoprolol due to allergy. -Not on Entresto or farxiga  due to orthostasis. -Management per cardiology    Hypertension Continue Norvasc, hydralazine, Imdur.   Hydralazine and Imdur have been uptitrated per cardiology for better blood pressure control.    Hyperlipidemia Continue statin.     Poorly controlled insulin-dependent type 2 diabetes mellitus Hemoglobin A1c 10.5 (05/13/2020) Continue Lantus, SSI.  Titrate insulin for inpatient goal 140-180   Constipation Continue MiraLAX twice daily Placed on Senokot-S twice daily Smog enema x1.    DVT prophylaxis: Eliquis Code Status: Full code. Family Communication: No family at bed side. Disposition Plan:  Status is: Inpatient  Remains inpatient appropriate because:Inpatient level of care appropriate due to severity of illness   Dispo: The patient is from: Home              Anticipated d/c is to: CIR vs SNF              Patient currently is not medically stable to d/c.   Difficult to place patient No   Consultants:  IR PCCM Cardiology  Procedures:  CT chest 06/07/2020 CT abdomen and pelvis 06/06/2020 Chest x-ray 06/06/2020, 06/08/2020 Abdominal ultrasound 06/07/2020 CT-guided placement of 14 French all-purpose drain catheter into left pleural space with aspiration of 950 cc of bloody fluid--- Per Dr. Grace Isaac, IR 06/07/2020 Pleural fibrinolysis x 6 days   Antimicrobials:  Anti-infectives (From admission, onward)    Start     Dose/Rate Route Frequency Ordered Stop   06/18/20 1630  fluconazole (DIFLUCAN) tablet 150 mg        150 mg Oral  Once 06/18/20 1544 06/18/20 1748   06/10/20 1400  metroNIDAZOLE (FLAGYL) tablet 500 mg  Status:  Discontinued        500 mg Oral Every 8 hours 06/10/20 1142 06/12/20 1356   06/08/20 0000  vancomycin (VANCOCIN) IVPB 1000 mg/200 mL premix  Status:  Discontinued        1,000 mg 200 mL/hr over 60 Minutes Intravenous Every 24 hours 06/07/20 0647 06/09/20 0800   06/07/20 1000  metroNIDAZOLE  (FLAGYL) IVPB 500 mg  Status:  Discontinued        500 mg 100 mL/hr over 60 Minutes Intravenous Every 8 hours 06/07/20 0635 06/10/20 1142   06/07/20 1000  ceFEPIme (MAXIPIME) 2 g in sodium chloride 0.9 % 100 mL IVPB  Status:  Discontinued        2 g 200 mL/hr over 30 Minutes Intravenous Every 8 hours 06/07/20 0647 06/12/20 1356   06/07/20 0115  vancomycin (VANCOCIN) IVPB 1000 mg/200 mL premix        1,000 mg 200 mL/hr over 60 Minutes Intravenous  Once 06/07/20 0114 06/07/20 0512   06/07/20 0115  ceFEPIme (MAXIPIME) 1 g in sodium chloride 0.9 % 100 mL IVPB  Status:  Discontinued        1 g 200 mL/hr over 30 Minutes Intravenous  Once 06/07/20 0114 06/07/20 0114   06/07/20 0115  metroNIDAZOLE (FLAGYL) IVPB 500 mg        500 mg 100 mL/hr over 60 Minutes Intravenous  Once 06/07/20 0114 06/07/20 0512   06/07/20 0115  ceFEPIme (MAXIPIME) 2 g in sodium chloride 0.9 % 100 mL IVPB        2 g 200 mL/hr over 30 Minutes Intravenous  Once 06/07/20 0114 06/07/20 8250        Subjective: Patient was seen and examined at bedside.  Overnight events noted.   Patient has participated in physical therapy, reports feeling exhausted after going to the bathroom. She reports  chest pain is improving.  Patient is awaiting CIR.  Objective: Vitals:   06/18/20 1234 06/18/20 2116 06/19/20 0500 06/19/20 0501  BP: (!) 124/51 135/64  (!) 147/69  Pulse: 76 68  72  Resp: 18 15  20   Temp: 98.2 F (36.8 C) 98.2 F (36.8 C)  97.9 F (36.6 C)  TempSrc: Oral Oral  Oral  SpO2: 96% 98%  95%  Weight:   79.1 kg   Height:        Intake/Output Summary (Last 24 hours) at 06/19/2020 1331 Last data filed at 06/19/2020 0900 Gross per 24 hour  Intake 600 ml  Output 400 ml  Net 200 ml    Filed Weights   06/17/20 0546 06/18/20 0430 06/19/20 0500  Weight: 78.8 kg 78.5 kg 79.1 kg    Examination:  General exam: Appears calm and comfortable, not in any acute distress. Respiratory system: Clear to auscultation.  Respiratory effort normal. RR 16 Cardiovascular system: S1 & S2 heard, RRR. No JVD, murmurs, rubs, gallops or clicks. No pedal edema. Gastrointestinal system: Abdomen is soft, nondistended, nontender. No organomegaly or masses felt.  Normal bowel sounds heard. Central nervous system: Alert and oriented. No focal neurological deficits. Extremities: Symmetric 5 x 5 power.  No edema, no cyanosis, no clubbing. Skin: No rashes, lesions or ulcers Psychiatry: Judgement and insight appear normal. Mood & affect appropriate.     Data Reviewed: I have personally reviewed following labs and imaging studies  CBC: Recent Labs  Lab 06/13/20 0504 06/14/20 0535 06/19/20 0613  WBC 9.2 7.5 7.1  HGB 9.0* 9.7* 9.9*  HCT 28.8* 30.8* 32.4*  MCV 79.6* 78.0* 80.8  PLT 579* 524* 491*    Basic Metabolic Panel: Recent Labs  Lab 06/13/20 0504 06/13/20 1720 06/14/20 0535 06/15/20 0506 06/16/20 0504 06/19/20 0613  NA 134*  --  133* 136 135 134*  K 5.9* 5.7* 5.1 4.8 4.3 4.4  CL 102  --  101 101 102 102  CO2 26  --  26 30 29 26   GLUCOSE 128*  --  66* 94 92 90  BUN 20  --  16 12 11  7*  CREATININE 0.79  --  0.57 0.74 0.46 0.64  CALCIUM 8.7*  --  8.6* 8.4* 8.1* 8.5*  MG  --   --   --   --   --  1.6*  PHOS  --   --   --   --   --  3.4    GFR: Estimated Creatinine Clearance: 67 mL/min (by C-G formula based on SCr of 0.64 mg/dL). Liver Function Tests: No results for input(s): AST, ALT, ALKPHOS, BILITOT, PROT, ALBUMIN in the last 168 hours. No results for input(s): LIPASE, AMYLASE in the last 168 hours. No results for input(s): AMMONIA in the last 168 hours. Coagulation Profile: No results for input(s): INR, PROTIME in the last 168 hours. Cardiac Enzymes: Recent Labs  Lab 06/13/20 1720  CKTOTAL 255*    BNP (last 3 results) No results for input(s): PROBNP in the last 8760 hours. HbA1C: No results for input(s): HGBA1C in the last 72 hours. CBG: Recent Labs  Lab 06/18/20 1202 06/18/20 1628  06/18/20 2118 06/19/20 0917 06/19/20 1204  GLUCAP 197* 161* 79 118* 109*    Lipid Profile: No results for input(s): CHOL, HDL, LDLCALC, TRIG, CHOLHDL, LDLDIRECT in the last 72 hours. Thyroid Function Tests: No results for input(s): TSH, T4TOTAL, FREET4, T3FREE, THYROIDAB in the last 72 hours. Anemia Panel: No results for input(s): VITAMINB12,  FOLATE, FERRITIN, TIBC, IRON, RETICCTPCT in the last 72 hours. Sepsis Labs: No results for input(s): PROCALCITON, LATICACIDVEN in the last 168 hours.  No results found for this or any previous visit (from the past 240 hour(s)).    Radiology Studies: No results found.   Scheduled Meds:  amiodarone  200 mg Oral Daily   apixaban  5 mg Oral BID   atorvastatin  80 mg Oral Daily   feeding supplement  1 Container Oral Q24H   ferrous gluconate  324 mg Oral Daily   insulin aspart  0-5 Units Subcutaneous QHS   insulin aspart  0-9 Units Subcutaneous TID WC   insulin aspart  2 Units Subcutaneous TID WC   insulin glargine  15 Units Subcutaneous Daily   isosorbide-hydrALAZINE  2 tablet Oral TID   magnesium oxide  400 mg Oral Daily   polyethylene glycol  17 g Oral BID   scopolamine  1 patch Transdermal Q72H   senna-docusate  1 tablet Oral BID   Continuous Infusions:  sodium chloride Stopped (06/12/20 0350)     LOS: 12 days    Time spent: 25 mins    Ceria Suminski, MD [facility]   If 7PM-7AM, please contact night-coverage

## 2020-06-19 NOTE — Progress Notes (Signed)
Physical Therapy Treatment Patient Details Name: [name] [name] MRN: [id number] DOB: 11-19-1950 Today's Date: 06/19/2020    History of Present Illness 70 yo female admited with "large/complicated" pleural effusion, L hemithorax s/p chest tube placement. CT removed 6/5Recent last admit-(CVA, post procedure cardiac arrest, cardiogenic shock, DCCV). Hx of DM, Afib, COVID, schizophrenia, bipolar d/o, CHF    PT Comments    Pt tolerates BLE strengthening exercises, denies increase in low back pain. Intermittent VCs for motor control and LE posture to avoid compensations. Pt declines mobilizing OOB this afternoon due to pain- nurse notified pt requesting pain medication. Pt educated to perform additional sets/reps later today and verbalizes agreement. Continue to progress acute PT as able.   Follow Up Recommendations  CIR     Equipment Recommendations  Rolling walker with 5" wheels;3in1 (PT)    Recommendations for Other Services       Precautions / Restrictions Precautions Precautions: Fall Precaution Comments: back pain Restrictions Weight Bearing Restrictions: No    Mobility  Bed Mobility  General bed mobility comments: pt declines mobility 2* back pain, agreeable to LE strengthening exercises    Transfers     Ambulation/Gait      Stairs             Wheelchair Mobility    Modified Rankin (Stroke Patients Only)       Balance                 Cognition Arousal/Alertness: Awake/alert Behavior During Therapy: WFL for tasks assessed/performed Overall Cognitive Status: Within Functional Limits for tasks assessed    Exercises General Exercises - Lower Extremity Ankle Circles/Pumps: Supine;AROM;Both;20 reps Quad Sets: Supine;AROM;Strengthening;Both;20 reps (3 sec isometric hold) Short Arc Quad: Supine;AROM;Strengthening;Both;20 reps Heel Slides: Supine;AROM;Strengthening;Both;20 reps Hip ABduction/ADduction: Supine;AROM;Strengthening;Both;20 reps     General Comments        Pertinent Vitals/Pain Pain Assessment: Faces Pain Score: 8  Pain Location: back Pain Descriptors / Indicators: Aching;Discomfort;Sore Pain Intervention(s): Limited activity within patient's tolerance;Monitored during session    Home Living                      Prior Function            PT Goals (current goals can now be found in the care plan section) Acute Rehab PT Goals Patient Stated Goal: get home PT Goal Formulation: With patient Time For Goal Achievement: 06/23/20 Potential to Achieve Goals: Good Progress towards PT goals: Progressing toward goals    Frequency    Min 2X/week      PT Plan Current plan remains appropriate    Co-evaluation              AM-PAC PT "6 Clicks" Mobility   Outcome Measure  Help needed turning from your back to your side while in a flat bed without using bedrails?: A Lot Help needed moving from lying on your back to sitting on the side of a flat bed without using bedrails?: A Lot Help needed moving to and from a bed to a chair (including a wheelchair)?: A Little Help needed standing up from a chair using your arms (e.g., wheelchair or bedside chair)?: A Little Help needed to walk in hospital room?: A Little Help needed climbing 3-5 steps with a railing? : A Little 6 Click Score: 16    End of Session   Activity Tolerance: Patient tolerated treatment well Patient left: in bed;with call bell/phone within reach;with bed alarm set Nurse Communication: Mobility  status;Patient requests pain meds PT Visit Diagnosis: Muscle weakness (generalized) (M62.81);Difficulty in walking, not elsewhere classified (R26.2);History of falling (Z91.81)     Time: 2671-2458 PT Time Calculation (min) (ACUTE ONLY): 20 min  Charges:  $Therapeutic Exercise: 8-22 mins                      Tori Su Duma PT, DPT 06/19/20, 1:53 PM

## 2020-06-19 NOTE — Progress Notes (Signed)
Occupational Therapy Evaluation Patient Details Name: [name] [name] MRN: [id number] DOB: March 01, 1950 Today's Date: 06/19/2020    History of Present Illness 70 yo female admited with "large/complicated" pleural effusion, L hemithorax s/p chest tube placement. CT removed 6/5Recent last admit-(CVA, post procedure cardiac arrest, cardiogenic shock, DCCV). Hx of DM, Afib, COVID, schizophrenia, bipolar d/o, CHF   Clinical Impression   Patient progressing towards goals. Limited by decreased activity tolerance and impaired balance today. Patient supervision for supine to sit and min guard for ambulation to bathroom with RW. Patient able to perform toileting with min guard with one instance of min assist when patient wobbled with prolonged standing to wipe (due to finding blood with wiping, Rn informed).    Follow Up Recommendations  CIR    Equipment Recommendations  None recommended by OT    Recommendations for Other Services       Precautions / Restrictions Precautions Precautions: Fall Precaution Comments: back pain Restrictions Weight Bearing Restrictions: No      Mobility Bed Mobility Overal bed mobility: Needs Assistance Bed Mobility: Supine to Sit     Supine to sit: HOB elevated;Supervision Sit to supine: Min assist   General bed mobility comments: min assist for LEs to return to supine.    Transfers Overall transfer level: Needs assistance Equipment used: Rolling walker (2 wheeled) Transfers: Sit to/from Stand Sit to Stand: Min guard         General transfer comment: Predominantly min guard for ambulation and standing. one mild LOB that she corrected on walker - min assist from therapist for safety.    Balance Overall balance assessment: Mild deficits observed, not formally tested                                         ADL either performed or assessed with clinical judgement   ADL Overall ADL's : Needs assistance/impaired     Grooming:  Min guard;Wash/dry hands Grooming Details (indicate cue type and reason): standing at sink wash hands with min guard.                 Toilet Transfer: RW;Ambulation;Regular Toilet;Grab bars;Min Pension scheme manager Details (indicate cue type and reason): min guard for toilet transfer Toileting- Clothing Manipulation and Hygiene: Minimal assistance;Sit to/from stand Toileting - Clothing Manipulation Details (indicate cue type and reason): patient able to perform pericare and clothing management with min guard. Patient found to have bright red blood with wiping. RN informed. Increased time to wipe due to bleeding - and patient had mild LOB standing to perform prolonged task.             Vision Patient Visual Report: No change from baseline       Perception     Praxis      Pertinent Vitals/Pain Pain Assessment: Faces Pain Score: 8  Faces Pain Scale: Hurts little more Pain Location: low back Pain Descriptors / Indicators: Grimacing Pain Intervention(s): Limited activity within patient's tolerance;Monitored during session     Hand Dominance     Extremity/Trunk Assessment             Communication     Cognition Arousal/Alertness: Awake/alert Behavior During Therapy: WFL for tasks assessed/performed Overall Cognitive Status: Within Functional Limits for tasks assessed  General Comments       Exercises General Exercises - Lower Extremity Ankle Circles/Pumps: Supine;AROM;Both;20 reps Quad Sets: Supine;AROM;Strengthening;Both;20 reps (3 sec isometric hold) Short Arc Quad: Supine;AROM;Strengthening;Both;20 reps Heel Slides: Supine;AROM;Strengthening;Both;20 reps Hip ABduction/ADduction: Supine;AROM;Strengthening;Both;20 reps   Shoulder Instructions      Home Living                                          Prior Functioning/Environment                   OT Problem List:        OT  Treatment/Interventions:      OT Goals(Current goals can be found in the care plan section) Acute Rehab OT Goals Patient Stated Goal: go on vacation OT Goal Formulation: With patient Time For Goal Achievement: 06/24/20 Potential to Achieve Goals: Good  OT Frequency: Min 2X/week   Barriers to D/C:            Co-evaluation              AM-PAC OT "6 Clicks" Daily Activity     Outcome Measure   Help from another person taking care of personal grooming?: A Little Help from another person toileting, which includes using toliet, bedpan, or urinal?: A Little Help from another person bathing (including washing, rinsing, drying)?: A Little Help from another person to put on and taking off regular upper body clothing?: A Little Help from another person to put on and taking off regular lower body clothing?: A Lot 6 Click Score: 14   End of Session Equipment Utilized During Treatment: Rolling walker Nurse Communication: Mobility status  Activity Tolerance: Patient limited by fatigue Patient left: in bed;with call bell/phone within reach;with nursing/sitter in room  OT Visit Diagnosis: Unsteadiness on feet (R26.81);Other abnormalities of gait and mobility (R26.89);Muscle weakness (generalized) (M62.81)                Time: 6270-3500 OT Time Calculation (min): 20 min Charges:  OT General Charges $OT Visit: 1 Visit OT Treatments $Self Care/Home Management : 8-22 mins  Waldron Session, OTR/L Acute Care Rehab Services  Office [phone] Pager: [phone]   Kelli Churn 06/19/2020, 2:54 PM

## 2020-06-19 NOTE — TOC Progression Note (Addendum)
Transition of Care [facility]) - Progression Note    Patient Details  Name: [name] [name] MRN: [id number] Date of Birth: [birth year]  Transition of Care [facility]) CM/SW Contact  Armel Rabbani, Olegario Messier, RN Phone Number: 06/19/2020, 9:21 AM  Clinical Narrative: CIR rep Laura-initiated auth-await [facility].   6/9-Response from Laura-CIR-await response from [facility] following. Per dtr [name]-if no CIR then [facility] for SNF-rep [place] following.dtr will do [facility] for [facility] once at next level of care.    Expected Discharge Plan: IP Rehab Facility Barriers to Discharge: Insurance Authorization  Expected Discharge Plan and Services Expected Discharge Plan: IP Rehab Facility   Discharge Planning Services: CM Consult Post Acute Care Choice: Home Health Living arrangements for the past 2 months: Skilled Nursing Facility                                       Social Determinants of Health (SDOH) Interventions    Readmission Risk Interventions Readmission Risk Prevention Plan 02/07/2020  Post Dischage Appt Complete  Medication Screening Complete  Transportation Screening Complete  Some recent data might be hidden

## 2020-06-19 NOTE — Care Management Important Message (Signed)
Important Message  Patient Details IM Letter given to the Patient. Name: [name] [name] MRN: [id number] Date of Birth: 07-26-1950   Medicare Important Message Given:  Yes     Caren Macadam 06/19/2020, 10:03 AM

## 2020-06-20 LAB — BASIC METABOLIC PANEL
Anion gap: 7 (ref 5–15)
BUN: 8 mg/dL (ref 8–23)
CO2: 27 mmol/L (ref 22–32)
Calcium: 8.6 mg/dL — ABNORMAL LOW (ref 8.9–10.3)
Chloride: 100 mmol/L (ref 98–111)
Creatinine, Ser: 0.8 mg/dL (ref 0.44–1.00)
GFR, Estimated: >60 mL/min (ref >60)
Glucose, Bld: 105 mg/dL — ABNORMAL HIGH (ref 70–99)
Potassium: 4.8 mmol/L (ref 3.5–5.1)
Sodium: 134 mmol/L — ABNORMAL LOW (ref 135–145)

## 2020-06-20 LAB — GLUCOSE, CAPILLARY
Glucose-Capillary: 101 mg/dL — ABNORMAL HIGH (ref 70–99)
Glucose-Capillary: 218 mg/dL — ABNORMAL HIGH (ref 70–99)
Glucose-Capillary: 218 mg/dL — ABNORMAL HIGH (ref 70–99)
Glucose-Capillary: 145 mg/dL — ABNORMAL HIGH (ref 70–99)

## 2020-06-20 LAB — MAGNESIUM: Magnesium: 1.9 mg/dL (ref 1.7–2.4)

## 2020-06-20 LAB — HEMOGLOBIN AND HEMATOCRIT, BLOOD
HCT: 32.9 % — ABNORMAL LOW (ref 36.0–46.0)
Hemoglobin: 9.9 g/dL — ABNORMAL LOW (ref 12.0–15.0)

## 2020-06-20 NOTE — TOC Progression Note (Addendum)
Transition of Care [facility]) - Progression Note    Patient Details  Name: [name] [name] MRN: [id number] Date of Birth: 04-05-1950  Transition of Care [facility]) CM/SW Contact  Brekyn Huntoon, Olegario Messier, RN Phone Number: 06/20/2020, 11:03 AM  Clinical Narrative: Noted per CIR rep Vernona Rieger for P2P.  1:53p-denied by insurance CIR;[facility] has accepted for SNF-message sent for PT/OT to re-eval for f/u recc. Await recc prior auth to be initiated.Will need covid within 24-48hrs of d/c.    Expected Discharge Plan: IP Rehab Facility Barriers to Discharge: Insurance Authorization  Expected Discharge Plan and Services Expected Discharge Plan: IP Rehab Facility   Discharge Planning Services: CM Consult Post Acute Care Choice: Home Health Living arrangements for the past 2 months: Skilled Nursing Facility                                       Social Determinants of Health (SDOH) Interventions    Readmission Risk Interventions Readmission Risk Prevention Plan 02/07/2020  Post Dischage Appt Complete  Medication Screening Complete  Transportation Screening Complete  Some recent data might be hidden

## 2020-06-20 NOTE — Progress Notes (Signed)
Inpatient Rehab Admissions Coordinator:   Insurance has requested peer to peer related to request for CIR admit. MD aware, to be completed by 12pm.  Megan Salon, MS, CCC-SLP Rehab Admissions Coordinator  [phone] (celll) [phone] (office)

## 2020-06-20 NOTE — Progress Notes (Signed)
PROGRESS NOTE    [name] [name]  TRZ:[id number] DOB: 1950/10/12 DOA: 06/06/2020 PCP: Dana Allan, MD   Brief Narrative: This 70 year old female with PMH significant of CHF(EF<20%), paroxysmal A. fib on chronic anticoagulation, hypertension, hyperlipidemia, IDDM type II, bipolar disorder recently hospitalized for acute embolic stroke requiring tPA and mechanical thrombectomy with hospital course complicated by cardiogenic shock/PEA cardiac arrest immediately following thrombectomy.  During that hospitalization patient required milrinone infusion and diuretics.  Patient also required vasopressors was extubated and weaned off pressors on 05/13/2020.  Patient status post TEE/DCCV on 5/9 and discharged to [facility] on  5/16.  Patient presented to the ED from SNF with right-sided abdominal pain, nausea,  vomiting, shortness of breath with speaking. Patient noted to have a leukocytosis with a white count of 12.9, hemoglobin of 9.3, platelet count of 493.  Patient pancultured.  Chest x-ray showed complete opacification of the left hemithorax.  CT chest concerning for large complex multiloculated left pleural effusion increased in size since prior CT of 05/12/2020 with differential concerning for empyema versus sequelae of previous hemothorax versus neoplasm.  IR consulted and pigtail catheter drain placed with 950 cc of bloody fluid aspirated.  PCCM consulted and patient had fibrinolysis 06/08/2020.  Cardiology also consulted for management of chronic complicated cardiac history.   5/28 admitted to [facility], 64fr chest tube placed by IR TPA/DNAse fibrinolysis daily x 6 from 5/29-6/3 Chest tube removed 6/5   Assessment & Plan:   Principal Problem:   Pleural effusion Active Problems:   HTN (hypertension)   Mixed hyperlipidemia   Uncontrolled type 2 diabetes mellitus with hyperglycemia (HCC)   A-fib (HCC)   Pleural effusion on left   Right upper quadrant abdominal pain   Chest pain   QT  prolongation  Large left multiloculated exudative pleural effusion : Patient presented with abdominal pain/chest pain,  shortness of breath while talking Found to have exudative pleural effusion per lights criteria. PCCM consulted. CT chest with large complex multilocular left pleural effusion increased in size since recent CT of the neck 05/12/2020. IR consulted and pigtail drain catheter placed with 950 cc of bloody aspirate noted. status post tPA/DNase on 5/29, 30, 31, 6/1, 6/2, 6/3 Resumed on Eliquis 06/13/20 She has completed antibiotics 6/1 (treated w/Cefepime, Flagyl) Chest tube removed 6/5.  She appears stable and comfortable.  Right-sided abdominal pain: > Improving most likely secondary to rib fractures and possible effusion. CT abdomen and pelvis with no acute intra-abdominal process. Abdominal ultrasound unremarkable. COVID-19 PCR negative, influenza A and B PCR negative. Lipase within normal limits. Continue IV Toradol 15 mg every 6 hours as needed pain. Continue Oxycodone as needed.    Hyperkalemia : > Resolved. No associated EKG changes. Treated with Lokelma.   Chest pain : > Improving Likely secondary to rib fractures and pleural effusion. Cardiology consulted - feel patient's chest pain syndrome is noncardiac with flat troponin trend.   Recent embolic CVA Status post tPA and mechanical thrombectomy during prior hospitalization. Eliquis was held due to primary problem, bloody pleural fluid , Resumed Eliquis 6/3 (tPA treatments completed)   Paroxysmal atrial fibrillation/status post TEE/ DCCV 5/9 Remains in normal sinus rhythm Continue amiodarone.   Eliquis resumed (was on hold during fibrinolysis)   QT prolongation Concerning as patient noted to be on amiodarone. Cardiology consulted: QT prolongation is normal on amiodarone.   Nonischemic cardiomyopathy/chronic systolic heart failure -Not on spironolactone/ACE/ARB due to history of hyperkalemia. -Not on  metoprolol due to allergy. -Not on Entresto or farxiga  due to orthostasis. -Management per cardiology    Hypertension Continue Norvasc, hydralazine, Imdur.   Hydralazine and Imdur have been uptitrated per cardiology for better blood pressure control.    Hyperlipidemia Continue statin.     Poorly controlled insulin-dependent type 2 diabetes mellitus Hemoglobin A1c 10.5 (05/13/2020) Continue Lantus, SSI.  Titrate insulin for inpatient goal 140-180   Constipation Continue MiraLAX twice daily Placed on Senokot-S twice daily Smog enema x1.    DVT prophylaxis: Eliquis Code Status: Full code. Family Communication: No family at bed side. Disposition Plan:  Status is: Inpatient  Remains inpatient appropriate because:Inpatient level of care appropriate due to severity of illness   Dispo: The patient is from: Home              Anticipated d/c is to: SNF              Patient currently is not medically stable to d/c.   Difficult to place patient No  Peer to peer review completed, insurance has denied CIR.  Patient is approved for skilled nursing facility for rehab pending updated PT and OT evaluation.  Consultants:  IR PCCM Cardiology  Procedures:  CT chest 06/07/2020 CT abdomen and pelvis 06/06/2020 Chest x-ray 06/06/2020, 06/08/2020 Abdominal ultrasound 06/07/2020 CT-guided placement of 14 French all-purpose drain catheter into left pleural space with aspiration of 950 cc of bloody fluid--- Per Dr. Grace Isaac, IR 06/07/2020 Pleural fibrinolysis x 6 days   Antimicrobials:  Anti-infectives (From admission, onward)    Start     Dose/Rate Route Frequency Ordered Stop   06/18/20 1630  fluconazole (DIFLUCAN) tablet 150 mg        150 mg Oral  Once 06/18/20 1544 06/18/20 1748   06/10/20 1400  metroNIDAZOLE (FLAGYL) tablet 500 mg  Status:  Discontinued        500 mg Oral Every 8 hours 06/10/20 1142 06/12/20 1356   06/08/20 0000  vancomycin (VANCOCIN) IVPB 1000 mg/200 mL premix   Status:  Discontinued        1,000 mg 200 mL/hr over 60 Minutes Intravenous Every 24 hours 06/07/20 0647 06/09/20 0800   06/07/20 1000  metroNIDAZOLE (FLAGYL) IVPB 500 mg  Status:  Discontinued        500 mg 100 mL/hr over 60 Minutes Intravenous Every 8 hours 06/07/20 0635 06/10/20 1142   06/07/20 1000  ceFEPIme (MAXIPIME) 2 g in sodium chloride 0.9 % 100 mL IVPB  Status:  Discontinued        2 g 200 mL/hr over 30 Minutes Intravenous Every 8 hours 06/07/20 0647 06/12/20 1356   06/07/20 0115  vancomycin (VANCOCIN) IVPB 1000 mg/200 mL premix        1,000 mg 200 mL/hr over 60 Minutes Intravenous  Once 06/07/20 0114 06/07/20 0512   06/07/20 0115  ceFEPIme (MAXIPIME) 1 g in sodium chloride 0.9 % 100 mL IVPB  Status:  Discontinued        1 g 200 mL/hr over 30 Minutes Intravenous  Once 06/07/20 0114 06/07/20 0114   06/07/20 0115  metroNIDAZOLE (FLAGYL) IVPB 500 mg        500 mg 100 mL/hr over 60 Minutes Intravenous  Once 06/07/20 0114 06/07/20 0512   06/07/20 0115  ceFEPIme (MAXIPIME) 2 g in sodium chloride 0.9 % 100 mL IVPB        2 g 200 mL/hr over 30 Minutes Intravenous  Once 06/07/20 0114 06/07/20 2376        Subjective: Patient was seen and examined at  bedside.  Overnight events noted.   Patient is lying comfortably in the bed,  having breakfast. She reports chest pain is improving.  Insurance has denied CIR,  she is awaiting discharge in a nursing home.  Objective: Vitals:   06/19/20 1821 06/19/20 2101 06/20/20 0443 06/20/20 1333  BP: 133/73 137/72 137/65 (!) 127/57  Pulse: 68 72 77 73  Resp: 20   20  Temp: 98 F (36.7 C) (!) 97.5 F (36.4 C) 97.8 F (36.6 C) 98.2 F (36.8 C)  TempSrc: Oral Oral Oral Oral  SpO2: 97% 97% 94% 96%  Weight:   79.1 kg   Height:        Intake/Output Summary (Last 24 hours) at 06/20/2020 1442 Last data filed at 06/20/2020 0800 Gross per 24 hour  Intake 720 ml  Output 1350 ml  Net -630 ml    Filed Weights   06/18/20 0430 06/19/20 0500  06/20/20 0443  Weight: 78.5 kg 79.1 kg 79.1 kg    Examination:  General exam: Appears calm and comfortable, not in any acute distress. Respiratory system: Clear to auscultation. Respiratory effort normal. RR 16 Cardiovascular system: S1 & S2 heard, RRR. No JVD, murmurs, rubs, gallops or clicks. No pedal edema. Gastrointestinal system: Abdomen is soft, nondistended, nontender. No organomegaly or masses felt.  Normal bowel sounds heard. Central nervous system: Alert and oriented. No focal neurological deficits. Extremities: Symmetric 5 x 5 power.  No edema, no cyanosis, no clubbing. Skin: No rashes, lesions or ulcers Psychiatry: Judgement and insight appear normal. Mood & affect appropriate.     Data Reviewed: I have personally reviewed following labs and imaging studies  CBC: Recent Labs  Lab 06/14/20 0535 06/19/20 0613 06/20/20 0510  WBC 7.5 7.1  --   HGB 9.7* 9.9* 9.9*  HCT 30.8* 32.4* 32.9*  MCV 78.0* 80.8  --   PLT 524* 491*  --     Basic Metabolic Panel: Recent Labs  Lab 06/14/20 0535 06/15/20 0506 06/16/20 0504 06/19/20 0613 06/20/20 0510  NA 133* 136 135 134* 134*  K 5.1 4.8 4.3 4.4 4.8  CL 101 101 102 102 100  CO2 26 30 29 26 27   GLUCOSE 66* 94 92 90 105*  BUN 16 12 11  7* 8  CREATININE 0.57 0.74 0.46 0.64 0.80  CALCIUM 8.6* 8.4* 8.1* 8.5* 8.6*  MG  --   --   --  1.6* 1.9  PHOS  --   --   --  3.4  --     GFR: Estimated Creatinine Clearance: 67 mL/min (by C-G formula based on SCr of 0.8 mg/dL). Liver Function Tests: No results for input(s): AST, ALT, ALKPHOS, BILITOT, PROT, ALBUMIN in the last 168 hours. No results for input(s): LIPASE, AMYLASE in the last 168 hours. No results for input(s): AMMONIA in the last 168 hours. Coagulation Profile: No results for input(s): INR, PROTIME in the last 168 hours. Cardiac Enzymes: Recent Labs  Lab 06/13/20 1720  CKTOTAL 255*    BNP (last 3 results) No results for input(s): PROBNP in the last 8760  hours. HbA1C: No results for input(s): HGBA1C in the last 72 hours. CBG: Recent Labs  Lab 06/19/20 1204 06/19/20 1639 06/19/20 2121 06/20/20 0754 06/20/20 1243  GLUCAP 109* 208* 137* 101* 218*    Lipid Profile: No results for input(s): CHOL, HDL, LDLCALC, TRIG, CHOLHDL, LDLDIRECT in the last 72 hours. Thyroid Function Tests: No results for input(s): TSH, T4TOTAL, FREET4, T3FREE, THYROIDAB in the last 72 hours. Anemia Panel:  No results for input(s): VITAMINB12, FOLATE, FERRITIN, TIBC, IRON, RETICCTPCT in the last 72 hours. Sepsis Labs: No results for input(s): PROCALCITON, LATICACIDVEN in the last 168 hours.  No results found for this or any previous visit (from the past 240 hour(s)).    Radiology Studies: No results found.   Scheduled Meds:  amiodarone  200 mg Oral Daily   apixaban  5 mg Oral BID   atorvastatin  80 mg Oral Daily   feeding supplement  1 Container Oral Q24H   ferrous gluconate  324 mg Oral Daily   insulin aspart  0-5 Units Subcutaneous QHS   insulin aspart  0-9 Units Subcutaneous TID WC   insulin aspart  2 Units Subcutaneous TID WC   insulin glargine  15 Units Subcutaneous Daily   isosorbide-hydrALAZINE  2 tablet Oral TID   magnesium oxide  400 mg Oral Daily   ondansetron (ZOFRAN) IV  4 mg Intravenous Once   polyethylene glycol  17 g Oral BID   scopolamine  1 patch Transdermal Q72H   senna-docusate  1 tablet Oral BID   Continuous Infusions:  sodium chloride Stopped (06/12/20 0350)     LOS: 13 days    Time spent: 25 mins    Selby Foisy, MD [facility]   If 7PM-7AM, please contact night-coverage

## 2020-06-20 NOTE — TOC Progression Note (Signed)
Transition of Care [facility]) - Progression Note    Patient Details  Name: [name] [name] MRN: [id number] Date of Birth: July 11, 1950  Transition of Care [facility]) CM/SW Contact  Ida Rogue, [place] Phone Number: 06/20/2020, 3:58 PM  Clinical Narrative:  Fransico Him reference # [id number].  I requested start date of tomorrow.  [facility] is able to take her this weekend with confirmed authorization. TOC will continue to follow during the course of hospitalization.      Expected Discharge Plan: Skilled Nursing Facility Barriers to Discharge: Insurance Authorization  Expected Discharge Plan and Services Expected Discharge Plan: Skilled Nursing Facility   Discharge Planning Services: CM Consult Post Acute Care Choice: Home Health Living arrangements for the past 2 months: Skilled Nursing Facility                                       Social Determinants of Health (SDOH) Interventions    Readmission Risk Interventions Readmission Risk Prevention Plan 02/07/2020  Post Dischage Appt Complete  Medication Screening Complete  Transportation Screening Complete  Some recent data might be hidden

## 2020-06-20 NOTE — Progress Notes (Signed)
Physical Therapy Treatment Patient Details Name: [name] [name] MRN: [id number] DOB: [birth year] Today's Date: 06/20/2020    History of Present Illness 70 yo female admited with "large/complicated" pleural effusion, L hemithorax s/p chest tube placement. CT removed 6/5Recent last admit-(CVA, post procedure cardiac arrest, cardiogenic shock, DCCV). Hx of DM, Afib, COVID, schizophrenia, bipolar d/o, CHF    PT Comments    Pt progressing toward goals. Acitivity tol improving, pt able to  incr gait distance. Continue PT POC in acute setting. Will benefit from SNF ot maximize independence   Follow Up Recommendations  SNF     Equipment Recommendations  Rolling walker with 5" wheels;3in1 (PT)    Recommendations for Other Services       Precautions / Restrictions Precautions Precautions: Fall Precaution Comments: back pain Restrictions Weight Bearing Restrictions: No    Mobility  Bed Mobility   Bed Mobility: Sit to Supine;Rolling;Sidelying to Sit Rolling: Min guard Sidelying to sit: Supervision;HOB elevated   Sit to supine: Min guard;Supervision;HOB elevated   General bed mobility comments: supervision for safety, cues to initiate and complete tasks, heavy use of rail    Transfers Overall transfer level: Needs assistance Equipment used: Rolling walker (2 wheeled) Transfers: Sit to/from Stand Sit to Stand: Min guard         General transfer comment: cues for hand placement, min/guard to steady on rising and transition to RW  Ambulation/Gait Ambulation/Gait assistance: Min guard Gait Distance (Feet): 35 Feet Assistive device: Rolling walker (2 wheeled) Gait Pattern/deviations: Step-through pattern;Decreased stride length;Trunk flexed Gait velocity: Decreased   General Gait Details: cues for trunk extension, RW position and safety with turns. in room distance to tolerance   Stairs             Wheelchair Mobility    Modified Rankin (Stroke Patients  Only)       Balance Overall balance assessment: Needs assistance Sitting-balance support: No upper extremity supported;Feet supported Sitting balance-Leahy Scale: Good     Standing balance support: During functional activity;Bilateral upper extremity supported Standing balance-Leahy Scale: Poor Standing balance comment: reliant on RW                            Cognition Arousal/Alertness: Awake/alert Behavior During Therapy: WFL for tasks assessed/performed Overall Cognitive Status: Within Functional Limits for tasks assessed                                        Exercises General Exercises - Lower Extremity Ankle Circles/Pumps: AROM;Both;10 reps Quad Sets: AROM;Both;5 reps Gluteal Sets: AROM;Strengthening;5 reps;Both    General Comments        Pertinent Vitals/Pain Pain Assessment: No/denies pain    Home Living                      Prior Function            PT Goals (current goals can now be found in the care plan section) Acute Rehab PT Goals Patient Stated Goal: go on vacation PT Goal Formulation: With patient Time For Goal Achievement: 06/23/20 Potential to Achieve Goals: Good Progress towards PT goals: Progressing toward goals    Frequency    Min 2X/week      PT Plan Discharge plan needs to be updated    Co-evaluation       OT goals addressed during  session: ADL's and self-care (functional mobility)      AM-PAC PT "6 Clicks" Mobility   Outcome Measure  Help needed turning from your back to your side while in a flat bed without using bedrails?: A Lot Help needed moving from lying on your back to sitting on the side of a flat bed without using bedrails?: A Lot Help needed moving to and from a bed to a chair (including a wheelchair)?: A Little Help needed standing up from a chair using your arms (e.g., wheelchair or bedside chair)?: A Little Help needed to walk in hospital room?: A Little Help needed  climbing 3-5 steps with a railing? : A Little 6 Click Score: 16    End of Session Equipment Utilized During Treatment: Gait belt Activity Tolerance: Patient tolerated treatment well Patient left: in bed;with call bell/phone within reach;with bed alarm set   PT Visit Diagnosis: Muscle weakness (generalized) (M62.81);Difficulty in walking, not elsewhere classified (R26.2);History of falling (Z91.81)     Time: 9242-6834 PT Time Calculation (min) (ACUTE ONLY): 18 min  Charges:                        Delice Bison, PT  Acute Rehab Dept [facility]) [phone] Pager [phone]  06/20/2020    [facility] 06/20/2020, 3:00 PM

## 2020-06-20 NOTE — Progress Notes (Signed)
Occupational Therapy Treatment Patient Details Name: [name] [name] MRN: [id number] DOB: August 16, 1950 Today's Date: 06/20/2020    History of present illness 70 yo female admited with "large/complicated" pleural effusion, L hemithorax s/p chest tube placement. CT removed 6/5Recent last admit-(CVA, post procedure cardiac arrest, cardiogenic shock, DCCV). Hx of DM, Afib, COVID, schizophrenia, bipolar d/o, CHF   OT comments  Treatment focused on toileting and ambulation in room to improve activity tolerance. Patient predominantly min guard but had one major loss of balance turning to sit one edge of bed - requiring therapist to steady patient. Patient progressing well towards goals. CIR denied patient but patient still needs short term rehab at discharge. Recommend SNF.     Follow Up Recommendations  SNF    Equipment Recommendations  None recommended by OT    Recommendations for Other Services      Precautions / Restrictions Precautions Precautions: Fall Precaution Comments: back pain Restrictions Weight Bearing Restrictions: No       Mobility Bed Mobility   Bed Mobility: Sit to Supine       Sit to supine: Min guard   General bed mobility comments: supervision to return to bed.    Transfers Overall transfer level: Needs assistance Equipment used: Rolling walker (2 wheeled) Transfers: Sit to/from Stand Sit to Stand: Min guard         General transfer comment: Predominantly min guard to ambulate with RW to bathroom and around room. Had one major loss of balance turning to sit on bed - therapist had to steady patient.    Balance Overall balance assessment: Needs assistance Sitting-balance support: No upper extremity supported Sitting balance-Leahy Scale: Good     Standing balance support: During functional activity;Bilateral upper extremity supported Standing balance-Leahy Scale: Poor Standing balance comment: reliant on walker, had one LOB today with turning to  sit on bed.                           ADL either performed or assessed with clinical judgement   ADL                       Lower Body Dressing: Minimal assistance;Sit to/from stand Lower Body Dressing Details (indicate cue type and reason): Min assist to don underwear. Toilet Transfer: RW;Ambulation;Regular Toilet;Grab bars;Min guard   Toileting- Clothing Manipulation and Hygiene: Sit to/from stand;Min guard Toileting - Clothing Manipulation Details (indicate cue type and reason): min guard for toileting today - increased time in standing to clean thoroughly.             Vision Baseline Vision/History: Wears glasses Wears Glasses: At all times     Perception     Praxis      Cognition Arousal/Alertness: Awake/alert Behavior During Therapy: WFL for tasks assessed/performed Overall Cognitive Status: Within Functional Limits for tasks assessed                                          Exercises     Shoulder Instructions       General Comments      Pertinent Vitals/ Pain       Pain Assessment: No/denies pain  Home Living  Prior Functioning/Environment              Frequency  Min 2X/week        Progress Toward Goals  OT Goals(current goals can now be found in the care plan section)  Progress towards OT goals: Progressing toward goals  Acute Rehab OT Goals Patient Stated Goal: go on vacation OT Goal Formulation: With patient Time For Goal Achievement: 06/24/20 Potential to Achieve Goals: Good  Plan Discharge plan needs to be updated    Co-evaluation          OT goals addressed during session: ADL's and self-care (functional mobility)      AM-PAC OT "6 Clicks" Daily Activity     Outcome Measure   Help from another person eating meals?: None Help from another person taking care of personal grooming?: A Little Help from another person toileting,  which includes using toliet, bedpan, or urinal?: A Little Help from another person bathing (including washing, rinsing, drying)?: A Little Help from another person to put on and taking off regular upper body clothing?: A Little Help from another person to put on and taking off regular lower body clothing?: A Little 6 Click Score: 19    End of Session Equipment Utilized During Treatment: Rolling walker  OT Visit Diagnosis: Unsteadiness on feet (R26.81);Other abnormalities of gait and mobility (R26.89);Muscle weakness (generalized) (M62.81)   Activity Tolerance Patient tolerated treatment well   Patient Left in bed;with call bell/phone within reach;with nursing/sitter in room;with bed alarm set   Nurse Communication Mobility status        Time: 3646-8032 OT Time Calculation (min): 14 min  Charges: OT General Charges $OT Visit: 1 Visit OT Treatments $Self Care/Home Management : 8-22 mins  Waldron Session, OTR/L Acute Care Rehab Services  Office [phone] Pager: [phone]    Kelli Churn 06/20/2020, 2:32 PM

## 2020-06-20 NOTE — Progress Notes (Signed)
Inpatient Rehab Admissions Coordinator:   Insurance has denied authorization for CIR. Case manager is working toward SNF. I notified Pt. And her daughter.   Megan Salon, MS, CCC-SLP Rehab Admissions Coordinator  [phone] (celll) [phone] (office)

## 2020-06-21 DIAGNOSIS — E1165 Type 2 diabetes mellitus with hyperglycemia: Secondary | ICD-10-CM | POA: Diagnosis not present

## 2020-06-21 DIAGNOSIS — I639 Cerebral infarction, unspecified: Secondary | ICD-10-CM | POA: Diagnosis not present

## 2020-06-21 DIAGNOSIS — I480 Paroxysmal atrial fibrillation: Secondary | ICD-10-CM | POA: Diagnosis not present

## 2020-06-21 DIAGNOSIS — E46 Unspecified protein-calorie malnutrition: Secondary | ICD-10-CM | POA: Diagnosis not present

## 2020-06-21 DIAGNOSIS — S2243XD Multiple fractures of ribs, bilateral, subsequent encounter for fracture with routine healing: Secondary | ICD-10-CM | POA: Diagnosis not present

## 2020-06-21 DIAGNOSIS — I4581 Long QT syndrome: Secondary | ICD-10-CM | POA: Diagnosis not present

## 2020-06-21 DIAGNOSIS — K219 Gastro-esophageal reflux disease without esophagitis: Secondary | ICD-10-CM | POA: Diagnosis not present

## 2020-06-21 DIAGNOSIS — E782 Mixed hyperlipidemia: Secondary | ICD-10-CM | POA: Diagnosis not present

## 2020-06-21 DIAGNOSIS — I469 Cardiac arrest, cause unspecified: Secondary | ICD-10-CM | POA: Diagnosis not present

## 2020-06-21 DIAGNOSIS — I69811 Memory deficit following other cerebrovascular disease: Secondary | ICD-10-CM | POA: Diagnosis not present

## 2020-06-21 DIAGNOSIS — I10 Essential (primary) hypertension: Secondary | ICD-10-CM | POA: Diagnosis not present

## 2020-06-21 DIAGNOSIS — I5022 Chronic systolic (congestive) heart failure: Secondary | ICD-10-CM | POA: Diagnosis not present

## 2020-06-21 DIAGNOSIS — I69891 Dysphagia following other cerebrovascular disease: Secondary | ICD-10-CM | POA: Diagnosis not present

## 2020-06-21 DIAGNOSIS — J918 Pleural effusion in other conditions classified elsewhere: Secondary | ICD-10-CM | POA: Diagnosis not present

## 2020-06-21 DIAGNOSIS — R079 Chest pain, unspecified: Secondary | ICD-10-CM | POA: Diagnosis not present

## 2020-06-21 DIAGNOSIS — J9601 Acute respiratory failure with hypoxia: Secondary | ICD-10-CM | POA: Diagnosis not present

## 2020-06-21 DIAGNOSIS — S2222XD Fracture of body of sternum, subsequent encounter for fracture with routine healing: Secondary | ICD-10-CM | POA: Diagnosis not present

## 2020-06-21 DIAGNOSIS — M6281 Muscle weakness (generalized): Secondary | ICD-10-CM | POA: Diagnosis not present

## 2020-06-21 DIAGNOSIS — J90 Pleural effusion, not elsewhere classified: Secondary | ICD-10-CM | POA: Diagnosis not present

## 2020-06-21 DIAGNOSIS — Z743 Need for continuous supervision: Secondary | ICD-10-CM | POA: Diagnosis not present

## 2020-06-21 DIAGNOSIS — R1010 Upper abdominal pain, unspecified: Secondary | ICD-10-CM | POA: Diagnosis not present

## 2020-06-21 DIAGNOSIS — R109 Unspecified abdominal pain: Secondary | ICD-10-CM | POA: Diagnosis not present

## 2020-06-21 LAB — RESP PANEL BY RT-PCR (FLU A&B, COVID) ARPGX2
Influenza A by PCR: NEGATIVE
Influenza B by PCR: NEGATIVE
SARS Coronavirus 2 by RT PCR: NEGATIVE

## 2020-06-21 LAB — GLUCOSE, CAPILLARY
Glucose-Capillary: 133 mg/dL — ABNORMAL HIGH (ref 70–99)
Glucose-Capillary: 186 mg/dL — ABNORMAL HIGH (ref 70–99)

## 2020-06-21 MED ORDER — AMIODARONE HCL 200 MG PO TABS: 200.0000 mg | ORAL_TABLET | Freq: Every day | ORAL | 1 refills | Status: DC

## 2020-06-21 MED ORDER — ISOSORB DINITRATE-HYDRALAZINE 20-37.5 MG PO TABS: 2.0000 | ORAL_TABLET | Freq: Three times a day (TID) | ORAL | 0 refills | Status: DC

## 2020-06-21 NOTE — Discharge Instructions (Signed)
Advised to follow-up with primary care physician in 1 week. Patient is being discharged to skilled nursing facility for rehab.   Patient is s/p pigtail catheter placement and removal because of loculated pleural effusion.

## 2020-06-21 NOTE — Progress Notes (Signed)
Attempted to call report to receiving facility, [facility], no answer when transferred. Will attempt again.

## 2020-06-21 NOTE — Discharge Summary (Addendum)
Physician Discharge Summary  ANASTON KOEHN IOE:[id number] DOB: 03/18/1950 DOA: 06/06/2020  PCP: Carollee Leitz, MD  Admit date: 06/06/2020  Discharge date: 06/21/2020  Admitted From: Home.  Disposition: SNF  Recommendations for Outpatient Follow-up:  Follow up with PCP in 1-2 weeks. Please obtain BMP/CBC in one week. Patient is being discharged to skilled nursing facility for rehab.   Patient is s/p pigtail catheter placement and removal because of loculated pleural effusion.  Home Health: None Equipment/Devices: Oxygen @ 2L/M  Discharge Condition: Stable CODE STATUS:Full code Diet recommendation: Heart Healthy   Brief Summary: This 70 year old female with PMH significant of CHF(EF<20%), paroxysmal A. fib on chronic anticoagulation, hypertension, hyperlipidemia, IDDM type II, bipolar disorder recently hospitalized for acute embolic stroke requiring tPA and mechanical thrombectomy with hospital course complicated by cardiogenic shock/PEA cardiac arrest immediately following thrombectomy.  During that hospitalization patient required milrinone infusion and diuretics.  Patient also required vasopressors was extubated and weaned off pressors on 05/13/2020.  Patient status post TEE/DCCV on 5/9 and discharged to [facility] on  05/26/20 .    Hospital course: Patient presented to the ED on 06/06/20 from SNF with right-sided abdominal pain, nausea,  vomiting, shortness of breath with speaking. Chest x-ray showed complete opacification of the left hemithorax.  CT chest concerning for large complex multiloculated left pleural effusion increased in size since prior CT of 05/12/2020 with differential concerning for empyema versus sequelae of previous hemothorax versus neoplasm.  IR consulted and pigtail catheter drain placed with 950 cc of bloody fluid aspirated.  PCCM consulted and patient had fibrinolysis 06/08/2020.  Cardiology also consulted for management of chronic complicated cardiac history.    5/28 admitted to [facility], 55f chest tube placed by IR TPA/DNAse fibrinolysis daily x 6 from 5/29-6/3 Chest tube removed 6/5 She remained stable following Pigtail removal.   Patient was continued on physical therapy and Occupational Therapy, recommended CIR which was denied by [facility]  Peer to peer completed recommended SNF for rehab.  Patient feels better and patient is being discharged to skilled nursing facility for rehab.  She was managed for below problems during hospitalization    Discharge Diagnoses:  Principal Problem:   Pleural effusion Active Problems:   HTN (hypertension)   Mixed hyperlipidemia   Uncontrolled type 2 diabetes mellitus with hyperglycemia (HCC)   A-fib (HCC)   Pleural effusion on left   Right upper quadrant abdominal pain   Chest pain   QT prolongation  Large left multiloculated exudative pleural effusion : Patient presented with abdominal pain/chest pain,  shortness of breath while talking Found to have exudative pleural effusion per lights criteria. PCCM consulted. CT chest with large complex multilocular left pleural effusion increased in size since recent CT of the neck 05/12/2020. IR consulted and pigtail drain catheter placed with 950 cc of bloody aspirate noted. status post tPA/DNase on 5/29, 30, 31, 6/1, 6/2, 6/3 Resumed on Eliquis 06/13/20 She has completed antibiotics 6/1 (treated w/Cefepime, Flagyl) Chest tube removed on 6/5.  She appears stable and comfortable.   Right-sided abdominal pain: > Improving most likely secondary to rib fractures and possible effusion. CT abdomen and pelvis with no acute intra-abdominal process. Abdominal ultrasound unremarkable. COVID-19 PCR negative, influenza A and B PCR negative. Lipase within normal limits. Continue IV Toradol 15 mg every 6 hours as needed pain. Continue Oxycodone as needed.   Hyperkalemia : > Resolved. No associated EKG changes. Treated with Lokelma.   Chest pain : >  Improved. Likely secondary to rib fractures  and pleural effusion. Cardiology consulted - feel patient's chest pain syndrome is noncardiac with flat troponin trend.   Recent embolic CVA Status post tPA and mechanical thrombectomy during prior hospitalization. Eliquis was held due to primary problem, bloody pleural fluid , Resumed Eliquis 6/3 (tPA treatments completed)   Paroxysmal atrial fibrillation/status post TEE/ DCCV 5/9 Remains in normal sinus rhythm Continue amiodarone.   Eliquis resumed (was on hold during fibrinolysis)   QT prolongation Concerning as patient noted to be on amiodarone. Cardiology consulted: QT prolongation is normal on amiodarone.   Nonischemic cardiomyopathy/chronic systolic heart failure -Not on spironolactone/ACE/ARB due to history of hyperkalemia. -Not on metoprolol due to allergy. -Not on Entresto or farxiga due to orthostasis. -Management per cardiology     Hypertension Continue Norvasc, hydralazine, Imdur.   Hydralazine and Imdur have been uptitrated per cardiology for better blood pressure control.     Hyperlipidemia Continue statin.     Poorly controlled insulin-dependent type 2 diabetes mellitus Hemoglobin A1c 10.5 (05/13/2020) Continue Lantus, SSI. Titrate insulin for inpatient goal 140-180   Constipation Continue MiraLAX twice daily Placed on Senokot-S twice daily Smog enema x1.  Discharge Instructions  Discharge Instructions     Call MD for:  difficulty breathing, headache or visual disturbances   Complete by: As directed    Call MD for:  persistant dizziness or light-headedness   Complete by: As directed    Call MD for:  persistant nausea and vomiting   Complete by: As directed    Diet - low sodium heart healthy   Complete by: As directed    Diet Carb Modified   Complete by: As directed    Discharge instructions   Complete by: As directed    Advised to follow-up with primary care physician in 1 week. Patient is being  discharged to skilled nursing facility for rehab.   Patient is s/p pigtail catheter placement and removal because of loculated pleural effusion.   Discharge wound care:   Complete by: As directed    Follow up PCP   Increase activity slowly   Complete by: As directed       Allergies as of 06/21/2020       Reactions   Amiodarone Shortness Of Breath, Other (See Comments)   Soreness, wheezing, and weakness also Update:  tolerating Amio PO 06/2020   Fruit & Vegetable Daily [nutritional Supplements] Shortness Of Breath, Swelling, Other (See Comments)   All fruits cause tongue swelling and numbness Organic fruits are okay to eat   Metoprolol Shortness Of Breath, Other (See Comments), Cough   Soreness, wheezing, and weakness also   Other Shortness Of Breath, Swelling, Other (See Comments)   Tree nuts cause tongue swelling and numbness   Peanut-containing Drug Products Shortness Of Breath, Swelling, Other (See Comments)   Tongue swelling and numbness   Aspirin Nausea And Vomiting, Other (See Comments)   Cannot take due to liver issues   Tylenol [acetaminophen] Other (See Comments)   Cannot take due to liver issues- tolerating 650 mg tablets in 2022   Lactose Intolerance (gi) Diarrhea, Nausea And Vomiting        Medication List     STOP taking these medications    hydrALAZINE 10 MG tablet Commonly known as: APRESOLINE   IMDUR PO   ondansetron 4 MG disintegrating tablet Commonly known as: ZOFRAN-ODT       TAKE these medications    accu-chek soft touch lancets Once daily testing.   acetaminophen 325 MG tablet Commonly known  as: TYLENOL Take 650 mg by mouth 3 (three) times daily as needed for mild pain or moderate pain.   amiodarone 200 MG tablet Commonly known as: PACERONE Take 1 tablet (200 mg total) by mouth daily. What changed: when to take this   amLODipine 5 MG tablet Commonly known as: NORVASC Take 5 mg by mouth daily.   Aspercreme Lidocaine 4 %  Ptch Generic drug: Lidocaine Apply 1 patch topically 2 (two) times daily. Apply to upper back   atorvastatin 80 MG tablet Commonly known as: LIPITOR Take 1 tablet (80 mg total) by mouth daily.   B-D UF III MINI PEN NEEDLES 31G X 5 MM Misc Generic drug: Insulin Pen Needle USE WITH LANTUS PEN DAILY AS DIRECTED   bisacodyl 10 MG suppository Commonly known as: DULCOLAX Place 1 suppository (10 mg total) rectally daily as needed for moderate constipation.   Blood Pressure Kit Monitor blood pressure twice a day   digoxin 0.125 MG tablet Commonly known as: LANOXIN Take 1 tablet (0.125 mg total) by mouth daily.   Eliquis 5 MG Tabs tablet Generic drug: apixaban TAKE 1 TABLET(5 MG) BY MOUTH TWICE DAILY What changed: See the new instructions.   feeding supplement (GLUCERNA SHAKE) Liqd Take 237 mLs by mouth 3 (three) times daily between meals.   ferrous gluconate 324 MG tablet Commonly known as: FERGON Take 324 mg by mouth daily.   glucose blood test strip Commonly known as: Accu-Chek Aviva Use as instructed   insulin aspart 100 UNIT/ML injection Commonly known as: novoLOG Inject into the skin 3 (three) times daily before meals. If BGC is less than 80, call MD  201-250, 2 units  251-300, 4 units 301-350, 6 units 351-400, 8 units 401-450, 10 units 451-500, 12 units 500 or more give 14 units of NovoLog, and recheck BCG in 2 hours   insulin glargine 100 UNIT/ML Solostar Pen Commonly known as: LANTUS Inject 18 Units into the skin daily. What changed:  how much to take when to take this additional instructions   isosorbide-hydrALAZINE 20-37.5 MG tablet Commonly known as: BIDIL Take 2 tablets by mouth 3 (three) times daily.   Magnesium Oxide 400 MG Caps Take 1 capsule (400 mg total) by mouth daily.   metaxalone 800 MG tablet Commonly known as: SKELAXIN Take 1 tablet by mouth in the morning, at noon, and at bedtime.   multivitamin with minerals Tabs tablet Take 1  tablet by mouth daily.   oxyCODONE 5 MG immediate release tablet Commonly known as: Oxy IR/ROXICODONE Take 1 tablet (5 mg total) by mouth every 6 (six) hours as needed for severe pain.   pantoprazole 40 MG tablet Commonly known as: PROTONIX Take 1 tablet (40 mg total) by mouth daily.   polyethylene glycol 17 g packet Commonly known as: MIRALAX / GLYCOLAX Take 17 g by mouth 2 (two) times daily.   senna-docusate 8.6-50 MG tablet Commonly known as: Senokot-S Take 2 tablets by mouth 2 (two) times daily.               Durable Medical Equipment  (From admission, onward)           Start     Ordered   06/09/20 2130  For home use only DME Walker rolling  Once       Question Answer Comment  Walker: With [place]   Patient needs a walker to treat with the following condition Debility      06/09/20 2129  Discharge Care Instructions  (From admission, onward)           Start     Ordered   06/21/20 0000  Discharge wound care:       Comments: Follow up PCP   06/21/20 1045            Contact information for after-discharge care     Destination     [facility] Preferred SNF .   Service: Skilled Nursing Contact information: [address] [address] [phone]                    Allergies  Allergen Reactions   Amiodarone Shortness Of Breath and Other (See Comments)    Soreness, wheezing, and weakness also Update:  tolerating Amio PO 06/2020   Fruit & Vegetable Daily [Nutritional Supplements] Shortness Of Breath, Swelling and Other (See Comments)    All fruits cause tongue swelling and numbness Organic fruits are okay to eat   Metoprolol Shortness Of Breath, Other (See Comments) and Cough    Soreness, wheezing, and weakness also   Other Shortness Of Breath, Swelling and Other (See Comments)    Tree nuts cause tongue swelling and numbness   Peanut-Containing Drug Products Shortness Of  Breath, Swelling and Other (See Comments)    Tongue swelling and numbness   Aspirin Nausea And Vomiting and Other (See Comments)    Cannot take due to liver issues   Tylenol [Acetaminophen] Other (See Comments)    Cannot take due to liver issues- tolerating 650 mg tablets in 2022   Lactose Intolerance (Gi) Diarrhea and Nausea And Vomiting    Consultations: PCCM IR Cardiology   Procedures/Studies: CT Abdomen Pelvis Wo Contrast  Result Date: 06/06/2020 CLINICAL DATA:  Right lower quadrant abdominal pain, nausea EXAM: CT ABDOMEN AND PELVIS WITHOUT CONTRAST TECHNIQUE: Multidetector CT imaging of the abdomen and pelvis was performed following the standard protocol without IV contrast. COMPARISON:  05/13/2020, 05/12/2020, 07/20/2015 FINDINGS: Lower chest: There is a large multiloculated heterogeneous left pleural effusion, with areas of higher attenuation seen anteriorly and posteriorly. There is complete atelectasis of the visualized left lung. Right lung is clear. The heart is unremarkable. Unenhanced CT was performed per clinician order. Lack of IV contrast limits sensitivity and specificity, especially for evaluation of abdominal/pelvic solid viscera. Hepatobiliary: No focal liver abnormality is seen. Status post cholecystectomy. No biliary dilatation. Pancreas: Unremarkable. No pancreatic ductal dilatation or surrounding inflammatory changes. Spleen: Indeterminate 1.3 cm hypodensity is seen within the posterior aspect of the spleen, image 37/2. Spleen is normal in size. Adrenals/Urinary Tract: No urinary tract calculi or obstructive uropathy within either kidney. The adrenals and bladder are unremarkable. Stomach/Bowel: No bowel obstruction or ileus. Normal appendix right lower quadrant. No bowel wall thickening or inflammatory change. Vascular/Lymphatic: Aortic atherosclerosis. No enlarged abdominal or pelvic lymph nodes. Reproductive: Status post hysterectomy. No adnexal masses. Other: No free  fluid or free gas.  No abdominal wall hernia. Musculoskeletal: There are subacute healing right anterolateral fourth and fifth rib fractures with minimal callus formation. No acute bony abnormalities. Reconstructed images demonstrate no additional findings. IMPRESSION: 1. Large complex loculated left pleural effusion, with complete atelectasis of the visualized left lower lobe. Differential diagnosis would include empyema or sequela of hemothorax. Dedicated CT of the chest may be useful to visualize this abnormality in its entirety. 2. No acute intra-abdominal or intrapelvic process. Normal appendix right lower quadrant. 3. Indeterminate hypodensity posterior aspect of the spleen, which could reflect  a small cyst or hemangioma. 4.  Aortic Atherosclerosis (ICD10-I70.0). Electronically Signed   By: Randa Ngo M.D.   On: 06/06/2020 23:02   DG Chest 2 View  Result Date: 06/15/2020 CLINICAL DATA:  Loculated left pleural effusion. EXAM: CHEST - 2 VIEW COMPARISON:  June 13, 2020. FINDINGS: Pigtail pleural catheter in the left lung base, unchanged. Similar size of the layering small left pleural effusion and left lower lobe airspace opacities. No visible pneumothorax. Right lung is clear. The heart size and mediastinal contours are unchanged. The visualized skeletal structures are unchanged the age. IMPRESSION: Similar size of the layering small left pleural effusion and left lower lobe airspace opacities. Electronically Signed   By: Dahlia Bailiff MD   On: 06/15/2020 14:36   DG Chest 2 View  Result Date: 06/06/2020 CLINICAL DATA:  Large left effusion on recent CT of the abdomen EXAM: CHEST - 2 VIEW COMPARISON:  05/13/2020 FINDINGS: Delete opacification of the left hemithorax is noted. Cardiac shadow is stable. Right lung is clear. No acute bony abnormality is noted. IMPRESSION: Opacification of the left hemithorax consistent with the complex diffusion seen on recent CT of the abdomen. Electronically Signed   By:  Inez Catalina M.D.   On: 06/06/2020 23:22   CT Chest Wo Contrast  Result Date: 06/07/2020 CLINICAL DATA:  Left pleural effusion, pneumonia, abnormal chest x-ray and abdominal CT EXAM: CT CHEST WITHOUT CONTRAST TECHNIQUE: Multidetector CT imaging of the chest was performed following the standard protocol without IV contrast. COMPARISON:  04/06/2010, 06/06/2020 FINDINGS: Cardiovascular: Unenhanced imaging of the heart and great vessels demonstrates no pericardial effusion. There is normal caliber of the thoracic aorta. Mild atherosclerosis of the aorta and coronary vessels. Mediastinum/Nodes: Chronic heterogeneous enlargement of the right lobe of the thyroid has been unchanged since 2012, compatible with goiter. This is been previously evaluated by ultrasound. Trachea and esophagus are unremarkable. Subcentimeter mediastinal adenopathy identified, largest measuring 8 mm in the AP window. Lungs/Pleura: There is complete atelectasis of the left lung. Heterogeneous multilocular left pleural effusion is identified, occupying the entire left hemithorax. Areas of higher attenuation are seen within the fluid collection anteriorly and posteriorly at the level of the hilum. Scattered hypoventilatory changes are seen within the right lung. Otherwise the right hemithorax is clear. Central airways are patent. Upper Abdomen: No acute abnormality. Musculoskeletal: No acute or destructive bony lesions. Reconstructed images demonstrate no additional findings. IMPRESSION: 1. Large complex multilocular left pleural effusion. This has increased in size since recent CT of the neck dated 05/12/2020. Differential includes empyema, sequela of previous hemothorax, or neoplasm. Correlation with cytology after thoracentesis may be useful. 2. Complete left lung atelectasis. 3. Stable heterogeneous enlargement right lobe thyroid compatible with goiter. This has been evaluated on previous imaging. Please refer to thyroid ultrasound dated  05/14/2020. (Ref: J Am Coll Radiol. 2015 Feb;12(2): 143-50). 4.  Aortic Atherosclerosis (ICD10-I70.0). Electronically Signed   By: Randa Ngo M.D.   On: 06/07/2020 00:31   CT CHEST W CONTRAST  Addendum Date: 06/12/2020   ADDENDUM REPORT: 06/12/2020 11:33 ADDENDUM: Discussed with Dr. Erin Fulling. Some pleural thickening is identified in the LEFT hemithorax, particularly at the anterior lung base. No discrete pleural mass. Electronically Signed   By: Lavonia Dana M.D.   On: 06/12/2020 11:33   Result Date: 06/12/2020 CLINICAL DATA:  Abnormal chest x-ray, pleural effusion, chest tube, recent CPR and subsequent anticoagulation EXAM: CT CHEST WITH CONTRAST TECHNIQUE: Multidetector CT imaging of the chest was performed during intravenous contrast administration.  Sagittal and coronal MPR images reconstructed from axial data set. CONTRAST:  77m OMNIPAQUE IOHEXOL 300 MG/ML  SOLN IV. COMPARISON:  04/07/2020 FINDINGS: Cardiovascular: Thoracic vascular structures patent. Coronary arterial calcifications. Aorta normal caliber. Mild enlargement of cardiac chambers. Small pericardial effusion again seen. Mediastinum/Nodes: Large RIGHT thyroid mass 4.0 x 3.5 cm image 13; this was recently assessed by ultrasound 05/14/2020 and percutaneous FNA was recommended. Base of cervical region otherwise unremarkable. Scattered normal sized axillary and mediastinal lymph nodes. No definite thoracic adenopathy. Mild wall thickening of mid to distal esophagus which could reflect esophagitis. Lungs/Pleura: Scattered subsegmental atelectasis throughout RIGHT lung. Minimal RIGHT pleural effusion. No RIGHT lung infiltrate. Calcified granuloma RIGHT upper lobe anteriorly image 51. Pigtail LEFT thoracostomy tube with decreased pleural effusion versus prior exam. Small LEFT pneumothorax. Significant atelectasis LEFT lower lobe, less LEFT upper lobe. No definite lung mass identified. Upper Abdomen: Gallbladder surgically absent. Mass versus infarct or  sequela of trauma at lateral margin of spleen, 2.9 x 1.4 cm image 113. Remaining visualized upper abdomen unremarkable. Musculoskeletal: Healing fracture anterior LEFT second rib. Questionable nondisplaced fracture anterior LEFT third rib. Additional fractures of lateral RIGHT second third fourth and fifth ribs. Nondisplaced anterior wall fracture mid sternum. IMPRESSION: Multiple BILATERAL rib fractures and sternal fracture. Decreased LEFT pleural effusion post placement of a pigtail LEFT thoracostomy tube. Small LEFT pneumothorax. Scattered subsegmental atelectasis throughout RIGHT lung with minimal RIGHT pleural effusion. Mass versus infarct or sequela of trauma at lateral margin of spleen 2.9 x 1.4 cm. Mild wall thickening of mid to distal esophagus which could reflect esophagitis. Coronary arterial calcifications. Known RIGHT thyroid mass, previously assessed by ultrasound; please refer to ultrasound report of 05/14/2020. Electronically Signed: By: MLavonia DanaM.D. On: 06/11/2020 18:31   UKoreaAbdomen Complete  Result Date: 06/07/2020 CLINICAL DATA:  Abdominal and back pain EXAM: ABDOMEN ULTRASOUND COMPLETE COMPARISON:  06/06/2020 FINDINGS: Gallbladder: Surgically absent. Common bile duct: Diameter: 5 mm Liver: No focal lesion identified. Within normal limits in parenchymal echogenicity. Portal vein is patent on color Doppler imaging with normal direction of blood flow towards the liver. IVC: No abnormality visualized. Pancreas: Visualized portion unremarkable. Spleen: Normal size and appearance. Evaluation is limited due to overlying bandaging from left chest tube. The splenic hypodensity seen on CT is not visualized by ultrasound. Right Kidney: Length: 10.5 cm. Echogenicity within normal limits. No mass or hydronephrosis visualized. Left Kidney: Length: 10.9 cm. Echogenicity within normal limits. No mass or hydronephrosis visualized. Abdominal aorta: No aneurysm visualized. Other findings: None. IMPRESSION:  1. Grossly unremarkable abdominal ultrasound. 2. Limited evaluation of the spleen given overlying bandage material related to left-sided chest tube. The hypodensity seen on CT is not visualized by ultrasound. Electronically Signed   By: MRanda NgoM.D.   On: 06/07/2020 21:55   DG CHEST PORT 1 VIEW  Result Date: 06/15/2020 CLINICAL DATA:  Status post LEFT chest tube removal. EXAM: PORTABLE CHEST 1 VIEW COMPARISON:  Chest x-rays dated 06/15/2020 and 06/13/2020. FINDINGS: LEFT-sided chest tube has been removed. LEFT lower lobe airspace opacities stable, likely a combination of atelectasis and small pleural effusion. RIGHT lung is clear. No pneumothorax is seen. IMPRESSION: 1. Interval removal of the LEFT-sided chest tube. No pneumothorax. 2. Otherwise stable chest x-ray. Stable LEFT lower lobe opacity which is most likely a combination of atelectasis and small pleural effusion. Electronically Signed   By: SFranki CabotM.D.   On: 06/15/2020 15:59   DG CHEST PORT 1 VIEW  Result Date: 06/13/2020 CLINICAL DATA:  Pleural effusion EXAM: PORTABLE CHEST 1 VIEW COMPARISON:  06/11/2020 FINDINGS: Pigtail pleural catheter on the left is unchanged in the left lung base. Pleural thickening on the left and left lower lobe airspace disease also unchanged. No pneumothorax. Right lung is clear without infiltrate or effusion. IMPRESSION: No interval change left pleural thickening and left lower lobe airspace disease. Electronically Signed   By: Franchot Gallo M.D.   On: 06/13/2020 08:00   DG CHEST PORT 1 VIEW  Result Date: 06/11/2020 CLINICAL DATA:  Pleural effusion. EXAM: PORTABLE CHEST 1 VIEW COMPARISON:  06/10/2020. FINDINGS: Left chest tube in stable position. Persistent moderate left pleural effusion. Low lung volumes with bibasilar atelectasis/infiltrates. No pneumothorax. Heart size stable. IMPRESSION: 1. Left chest tube in stable position. Persistent moderate left pleural effusion. No pneumothorax. 2.  Low lung  volumes with bibasilar atelectasis/infiltrates. Electronically Signed   By: Marcello Moores  Register   On: 06/11/2020 06:59   DG CHEST PORT 1 VIEW  Result Date: 06/10/2020 CLINICAL DATA:  70 year old female with history of pleural effusion. EXAM: PORTABLE CHEST 1 VIEW COMPARISON:  Chest x-ray 06/09/2020. FINDINGS: Small bore left-sided chest tube with tip in the lower left hemithorax. Previously noted left-sided pneumothorax is not confidently identified on today's examination. There does appear to be some accumulated left-sided pleural fluid, much of which extends into the upper left hemithorax laterally. Patchy opacities are noted throughout the left lung, which may reflect residual areas of atelectasis and/or consolidation. Right lung is low volume, but appears well aerated. No right pleural effusion. No evidence of pulmonary edema. Heart size is normal. Upper mediastinal contours are within normal limits allowing for patient positioning. IMPRESSION: 1. Left-sided chest tube is stable in position with resolution of previously noted pneumothorax but persistence of a partially loculated left pleural fluid which is at least moderate in volume. Residual areas of atelectasis and/or consolidation in the left lung. Electronically Signed   By: Vinnie Langton M.D.   On: 06/10/2020 05:05   DG CHEST PORT 1 VIEW  Result Date: 06/09/2020 CLINICAL DATA:  Follow-up left pleural effusion and chest tube. EXAM: PORTABLE CHEST 1 VIEW COMPARISON:  06/08/2020. FINDINGS: Left chest tube is stable. There is improvement in left lung aeration now with partial clearing of the left mid to upper lung. There is also evidence of a small left pneumothorax noted along upper lateral aspect of the lung, which may be an ex vacuo pneumothorax. Mild atelectasis at the medial right lung base. Right lung is otherwise clear. No right pneumothorax or effusion. IMPRESSION: 1. Mild interval improvement. Partial interval clearing of the left lung  compared to the previous day's exam. 2. New small left pneumothorax likely an ex vacuo pneumothorax. 3. No other change. Electronically Signed   By: Lajean Manes M.D.   On: 06/09/2020 08:06   DG CHEST PORT 1 VIEW  Result Date: 06/08/2020 CLINICAL DATA:  Pleural effusion EXAM: PORTABLE CHEST 1 VIEW COMPARISON:  CT chest dated 06/07/2020 FINDINGS: Near complete opacification of the left hemithorax, related to the patient's known complex pleural effusion/hemothorax. Mild lucency at the lung base with indwelling pigtail drainage catheter. Right lung is clear. The heart is partially obscured. IMPRESSION: Near complete opacification of the left hemithorax, now with mild lucency at the left lung base, with indwelling pigtail drainage catheter. Electronically Signed   By: Julian Hy M.D.   On: 06/08/2020 05:46   CT [facility] PLEURAL DRAIN W/INDWELL CATH W/IMG GUIDE  Result Date: 06/07/2020 INDICATION: Large left-sided pleural effusion, worrisome for  hemothorax given history of recent CPR with subsequent initiation anticoagulation. Please perform image guided left-sided chest tube placement for therapeutic and diagnostic purposes. EXAM: CT PERC PLEURAL DRAIN W/INDWELL CATH W/IMG GUIDE COMPARISON:  Chest CT-06/07/2020 MEDICATIONS: The patient is currently admitted to the hospital and receiving intravenous antibiotics. The antibiotics were administered within an appropriate time frame prior to the initiation of the procedure. ANESTHESIA/SEDATION: Moderate (conscious) sedation was employed during this procedure. A total of Versed 2 mg and Fentanyl 100 mcg was administered intravenously. Moderate Sedation Time: 19 minutes. The patient's level of consciousness and vital signs were monitored continuously by radiology nursing throughout the procedure under my direct supervision. CONTRAST:  None COMPLICATIONS: None immediate. PROCEDURE: Informed written consent was obtained from the patient after a discussion of the  risks, benefits and alternatives to treatment. The patient was placed supine, slightly RPO on the CT gantry and a pre procedural CT was performed re-demonstrating the known large likely partially loculated left-sided pleural effusion. The procedure was planned. A timeout was performed prior to the initiation of the procedure. The skin overlying the inferolateral aspect of left chest was prepped and draped in the usual sterile fashion. The overlying soft tissues were anesthetized with 1% lidocaine with epinephrine. Appropriate trajectory was planned with the use of a 22 gauge spinal needle. An 18 gauge trocar needle was advanced into the abscess/fluid collection and a short Amplatz super stiff wire was coiled within the collection. Appropriate positioning was confirmed with a limited CT scan. The tract was serially dilated allowing placement of a 14 French all-purpose drainage catheter. Appropriate positioning was confirmed with a limited postprocedural CT scan. Approximately 950 cc of bloody fluid was aspirated. The tube was connected to a pleura vac device and sutured in place. A dressing was applied. The patient tolerated the procedure well without immediate post procedural complication. IMPRESSION: Successful CT guided placement of a 25 French all purpose drain catheter into the basilar aspect the left pleural space with aspiration of 950 cc of bloody fluid. Samples were sent to the laboratory as requested by the ordering clinical team. Electronically Signed   By: Sandi Mariscal M.D.   On: 06/07/2020 15:16    S/p pigtail placement and removal.   Subjective: Patient was seen and examined at bedside.  Overnight events noted.  She is sitting comfortably on the bed. Patient feels better and wants to be discharged.  patient is being discharged to skilled nursing facility for rehab today.  Discharge Exam: Vitals:   06/20/20 1333 06/20/20 2130  BP: (!) 127/57 123/66  Pulse: 73 69  Resp: 20 20  Temp: 98.2 F  (36.8 C) 98 F (36.7 C)  SpO2: 96% 90%   Vitals:   06/20/20 0443 06/20/20 1333 06/20/20 2130 06/21/20 0544  BP: 137/65 (!) 127/57 123/66   Pulse: 77 73 69   Resp:  20 20   Temp: 97.8 F (36.6 C) 98.2 F (36.8 C) 98 F (36.7 C)   TempSrc: Oral Oral    SpO2: 94% 96% 90%   Weight: 79.1 kg   78.4 kg  Height:        General: Pt is alert, awake, not in acute distress Cardiovascular: RRR, S1/S2 +, no rubs, no gallops Respiratory: CTA bilaterally, no wheezing, no rhonchi Abdominal: Soft, NT, ND, bowel sounds + Extremities: no edema, no cyanosis    The results of significant diagnostics from this hospitalization (including imaging, microbiology, ancillary and laboratory) are listed below for reference.     Microbiology:  No results found for this or any previous visit (from the past 240 hour(s)).   Labs: BNP (last 3 results) Recent Labs    03/03/20 1559 06/07/20 0735  BNP 1,981.9* 578.4*   Basic Metabolic Panel: Recent Labs  Lab 06/15/20 0506 06/16/20 0504 06/19/20 0613 06/20/20 0510  NA 136 135 134* 134*  K 4.8 4.3 4.4 4.8  CL 101 102 102 100  CO2 _0 GLUCOSE 94 92 90 105*  BUN 12 11 7* 8  CREATININE 0.74 0.46 0.64 0.80  CALCIUM 8.4* 8.1* 8.5* 8.6*  MG  --   --  1.6* 1.9  PHOS  --   --  3.4  --    Liver Function Tests: No results for input(s): AST, ALT, ALKPHOS, BILITOT, PROT, ALBUMIN in the last 168 hours. No results for input(s): LIPASE, AMYLASE in the last 168 hours. No results for input(s): AMMONIA in the last 168 hours. CBC: Recent Labs  Lab 06/19/20 0613 06/20/20 0510  WBC 7.1  --   HGB 9.9* 9.9*  HCT 32.4* 32.9*  MCV 80.8  --   PLT 491*  --    Cardiac Enzymes: No results for input(s): CKTOTAL, CKMB, CKMBINDEX, TROPONINI in the last 168 hours. BNP: Invalid input(s): POCBNP CBG: Recent Labs  Lab 06/20/20 0754 06/20/20 1243 06/20/20 1843 06/20/20 2129 06/21/20 0727  GLUCAP 101* 218* 218* 145* 133*   D-Dimer No results for  input(s): DDIMER in the last 72 hours. Hgb A1c No results for input(s): HGBA1C in the last 72 hours. Lipid Profile No results for input(s): CHOL, HDL, LDLCALC, TRIG, CHOLHDL, LDLDIRECT in the last 72 hours. Thyroid function studies No results for input(s): TSH, T4TOTAL, T3FREE, THYROIDAB in the last 72 hours.  Invalid input(s): FREET3 Anemia work up No results for input(s): VITAMINB12, FOLATE, FERRITIN, TIBC, IRON, RETICCTPCT in the last 72 hours. Urinalysis    Component Value Date/Time   COLORURINE AMBER (A) 06/06/2020 2327   APPEARANCEUR CLOUDY (A) 06/06/2020 2327   LABSPEC 1.028 06/06/2020 2327   PHURINE 5.0 06/06/2020 2327   GLUCOSEU 50 (A) 06/06/2020 2327   HGBUR NEGATIVE 06/06/2020 2327   BILIRUBINUR NEGATIVE 06/06/2020 2327   KETONESUR NEGATIVE 06/06/2020 2327   PROTEINUR 30 (A) 06/06/2020 2327   UROBILINOGEN 1.0 10/09/2014 1605   NITRITE NEGATIVE 06/06/2020 2327   LEUKOCYTESUR SMALL (A) 06/06/2020 2327   Sepsis Labs Invalid input(s): PROCALCITONIN,  WBC,  LACTICIDVEN Microbiology No results found for this or any previous visit (from the past 240 hour(s)).   Time coordinating discharge: Over 30 minutes  SIGNED:   Shawna Clamp, MD  [facility] 06/21/2020, 11:37 AM Pager   If 7PM-7AM, please contact night-coverage [email or web address]

## 2020-06-21 NOTE — Progress Notes (Signed)
Attempted to call report for a second time at 1543 to [facility], again with no answer when transferred. Pt discharged via PTAR at 1531. Daughter [name] made aware of discharge.

## 2020-06-21 NOTE — TOC Progression Note (Signed)
Transition of Care [facility]) - Progression Note    Patient Details  Name: [name] [name] MRN: [id number] Date of Birth: 15-Dec-1950  Transition of Care [facility]) CM/SW Contact  Ida Rogue, [place] Phone Number: 06/21/2020, 8:47 AM  Clinical Narrative:   Patient has been approved by Fransico Him for SNF placement.  Reference [id number]. [id number]  [facility] for 4 days, 6-11 thru 6/14, Reviewer if Ariste D  FAX: [phone].    Expected Discharge Plan: Skilled Nursing Facility Barriers to Discharge: Insurance Authorization  Expected Discharge Plan and Services Expected Discharge Plan: Skilled Nursing Facility   Discharge Planning Services: CM Consult Post Acute Care Choice: Home Health Living arrangements for the past 2 months: Skilled Nursing Facility                                       Social Determinants of Health (SDOH) Interventions    Readmission Risk Interventions Readmission Risk Prevention Plan 02/07/2020  Post Dischage Appt Complete  Medication Screening Complete  Transportation Screening Complete  Some recent data might be hidden

## 2020-06-21 NOTE — TOC Transition Note (Signed)
Transition of Care [facility]) - CM/SW Discharge Note   Patient Details  Name: [name] [name] MRN: [id number] Date of Birth: [birth year]  Transition of Care [facility]) CM/SW Contact:  Ida Rogue, LCSW Phone Number: 06/21/2020, 11:53 AM   Clinical Narrative:   Patient who is stable for d/c will transfer to [facility] today.  Family alerted.  PTAR arranged. Nursing, please call report to [phone], room 120. TOC sign off.    Final next level of care: Skilled Nursing Facility Barriers to Discharge: Barriers Resolved   Patient Goals and CMS Choice Patient states their goals for this hospitalization and ongoing recovery are:: go home CMS Medicare.gov Compare Post Acute Care list provided to:: Patient Represenative (must comment) ([name] (dtr) wants HHC.) Choice offered to / list presented to : Adult Children  Discharge Placement                       Discharge Plan and Services   Discharge Planning Services: CM Consult Post Acute Care Choice: Home Health                               Social Determinants of Health (SDOH) Interventions     Readmission Risk Interventions Readmission Risk Prevention Plan 02/07/2020  Post Dischage Appt Complete  Medication Screening Complete  Transportation Screening Complete  Some recent data might be hidden

## 2020-06-23 ENCOUNTER — Telehealth: Payer: Self-pay

## 2020-06-23 NOTE — Telephone Encounter (Signed)
-----   Message from Martina Sinner, MD sent at 06/20/2020  9:36 AM EDT ----- Regarding: hops f/u and CT chest scan Please schedule patient for hospital follow up and CT chest without contrast in 6 weeks. Follow up can be shortly after the CT scan with me. Let the patient know or her daughter who was involved in her care while at the hospital.   Thanks, Cletis Athens

## 2020-06-23 NOTE — Telephone Encounter (Signed)
Called patient but she requested that I call her daughter [name] [name]. I called [name] but she did not answer and her VM is full. Will attempt to call back later.

## 2020-07-02 ENCOUNTER — Ambulatory Visit: Payer: Medicare Other | Admitting: Podiatry

## 2020-07-07 LAB — FUNGUS CULTURE WITH STAIN

## 2020-07-07 LAB — FUNGAL ORGANISM REFLEX

## 2020-07-07 LAB — FUNGUS CULTURE RESULT

## 2020-07-08 ENCOUNTER — Inpatient Hospital Stay: Payer: Medicare Other | Admitting: Adult Health

## 2020-07-08 DIAGNOSIS — E1165 Type 2 diabetes mellitus with hyperglycemia: Secondary | ICD-10-CM | POA: Diagnosis not present

## 2020-07-08 DIAGNOSIS — I69891 Dysphagia following other cerebrovascular disease: Secondary | ICD-10-CM | POA: Diagnosis not present

## 2020-07-08 DIAGNOSIS — E46 Unspecified protein-calorie malnutrition: Secondary | ICD-10-CM | POA: Diagnosis not present

## 2020-07-08 DIAGNOSIS — S2222XD Fracture of body of sternum, subsequent encounter for fracture with routine healing: Secondary | ICD-10-CM | POA: Diagnosis not present

## 2020-07-08 DIAGNOSIS — E782 Mixed hyperlipidemia: Secondary | ICD-10-CM | POA: Diagnosis not present

## 2020-07-08 DIAGNOSIS — I5022 Chronic systolic (congestive) heart failure: Secondary | ICD-10-CM | POA: Diagnosis not present

## 2020-07-08 DIAGNOSIS — I639 Cerebral infarction, unspecified: Secondary | ICD-10-CM | POA: Diagnosis not present

## 2020-07-08 DIAGNOSIS — I10 Essential (primary) hypertension: Secondary | ICD-10-CM | POA: Diagnosis not present

## 2020-07-08 DIAGNOSIS — S2243XD Multiple fractures of ribs, bilateral, subsequent encounter for fracture with routine healing: Secondary | ICD-10-CM | POA: Diagnosis not present

## 2020-07-08 DIAGNOSIS — I480 Paroxysmal atrial fibrillation: Secondary | ICD-10-CM | POA: Diagnosis not present

## 2020-07-09 DIAGNOSIS — I480 Paroxysmal atrial fibrillation: Secondary | ICD-10-CM | POA: Diagnosis not present

## 2020-07-09 DIAGNOSIS — K219 Gastro-esophageal reflux disease without esophagitis: Secondary | ICD-10-CM | POA: Diagnosis not present

## 2020-07-09 DIAGNOSIS — I5022 Chronic systolic (congestive) heart failure: Secondary | ICD-10-CM | POA: Diagnosis not present

## 2020-07-09 DIAGNOSIS — E782 Mixed hyperlipidemia: Secondary | ICD-10-CM | POA: Diagnosis not present

## 2020-07-09 DIAGNOSIS — Z7901 Long term (current) use of anticoagulants: Secondary | ICD-10-CM | POA: Diagnosis not present

## 2020-07-09 DIAGNOSIS — I69311 Memory deficit following cerebral infarction: Secondary | ICD-10-CM | POA: Diagnosis not present

## 2020-07-09 DIAGNOSIS — Y848 Other medical procedures as the cause of abnormal reaction of the patient, or of later complication, without mention of misadventure at the time of the procedure: Secondary | ICD-10-CM | POA: Diagnosis not present

## 2020-07-09 DIAGNOSIS — I700 Atherosclerosis of aorta: Secondary | ICD-10-CM | POA: Diagnosis not present

## 2020-07-09 DIAGNOSIS — I69398 Other sequelae of cerebral infarction: Secondary | ICD-10-CM | POA: Diagnosis not present

## 2020-07-09 DIAGNOSIS — M9689 Other intraoperative and postprocedural complications and disorders of the musculoskeletal system: Secondary | ICD-10-CM | POA: Diagnosis not present

## 2020-07-09 DIAGNOSIS — E119 Type 2 diabetes mellitus without complications: Secondary | ICD-10-CM | POA: Diagnosis not present

## 2020-07-09 DIAGNOSIS — R531 Weakness: Secondary | ICD-10-CM | POA: Diagnosis not present

## 2020-07-09 DIAGNOSIS — J9811 Atelectasis: Secondary | ICD-10-CM | POA: Diagnosis not present

## 2020-07-09 DIAGNOSIS — Z79899 Other long term (current) drug therapy: Secondary | ICD-10-CM | POA: Diagnosis not present

## 2020-07-09 DIAGNOSIS — I69391 Dysphagia following cerebral infarction: Secondary | ICD-10-CM | POA: Diagnosis not present

## 2020-07-09 DIAGNOSIS — Z9181 History of falling: Secondary | ICD-10-CM | POA: Diagnosis not present

## 2020-07-09 DIAGNOSIS — I428 Other cardiomyopathies: Secondary | ICD-10-CM | POA: Diagnosis not present

## 2020-07-09 DIAGNOSIS — I110 Hypertensive heart disease with heart failure: Secondary | ICD-10-CM | POA: Diagnosis not present

## 2020-07-10 DIAGNOSIS — I480 Paroxysmal atrial fibrillation: Secondary | ICD-10-CM | POA: Diagnosis not present

## 2020-07-10 DIAGNOSIS — K219 Gastro-esophageal reflux disease without esophagitis: Secondary | ICD-10-CM | POA: Diagnosis not present

## 2020-07-10 DIAGNOSIS — R531 Weakness: Secondary | ICD-10-CM | POA: Diagnosis not present

## 2020-07-10 DIAGNOSIS — E782 Mixed hyperlipidemia: Secondary | ICD-10-CM | POA: Diagnosis not present

## 2020-07-10 DIAGNOSIS — I69391 Dysphagia following cerebral infarction: Secondary | ICD-10-CM | POA: Diagnosis not present

## 2020-07-10 DIAGNOSIS — Z79899 Other long term (current) drug therapy: Secondary | ICD-10-CM | POA: Diagnosis not present

## 2020-07-10 DIAGNOSIS — I69398 Other sequelae of cerebral infarction: Secondary | ICD-10-CM | POA: Diagnosis not present

## 2020-07-10 DIAGNOSIS — I428 Other cardiomyopathies: Secondary | ICD-10-CM | POA: Diagnosis not present

## 2020-07-10 DIAGNOSIS — I69311 Memory deficit following cerebral infarction: Secondary | ICD-10-CM | POA: Diagnosis not present

## 2020-07-10 DIAGNOSIS — I700 Atherosclerosis of aorta: Secondary | ICD-10-CM | POA: Diagnosis not present

## 2020-07-10 DIAGNOSIS — J9811 Atelectasis: Secondary | ICD-10-CM | POA: Diagnosis not present

## 2020-07-10 DIAGNOSIS — Z9181 History of falling: Secondary | ICD-10-CM | POA: Diagnosis not present

## 2020-07-10 DIAGNOSIS — Z7901 Long term (current) use of anticoagulants: Secondary | ICD-10-CM | POA: Diagnosis not present

## 2020-07-10 DIAGNOSIS — I110 Hypertensive heart disease with heart failure: Secondary | ICD-10-CM | POA: Diagnosis not present

## 2020-07-10 DIAGNOSIS — M9689 Other intraoperative and postprocedural complications and disorders of the musculoskeletal system: Secondary | ICD-10-CM | POA: Diagnosis not present

## 2020-07-10 DIAGNOSIS — E119 Type 2 diabetes mellitus without complications: Secondary | ICD-10-CM | POA: Diagnosis not present

## 2020-07-10 DIAGNOSIS — Y848 Other medical procedures as the cause of abnormal reaction of the patient, or of later complication, without mention of misadventure at the time of the procedure: Secondary | ICD-10-CM | POA: Diagnosis not present

## 2020-07-10 DIAGNOSIS — I5022 Chronic systolic (congestive) heart failure: Secondary | ICD-10-CM | POA: Diagnosis not present

## 2020-07-11 ENCOUNTER — Telehealth: Payer: Self-pay

## 2020-07-11 DIAGNOSIS — I69398 Other sequelae of cerebral infarction: Secondary | ICD-10-CM | POA: Diagnosis not present

## 2020-07-11 DIAGNOSIS — K219 Gastro-esophageal reflux disease without esophagitis: Secondary | ICD-10-CM | POA: Diagnosis not present

## 2020-07-11 DIAGNOSIS — J9811 Atelectasis: Secondary | ICD-10-CM | POA: Diagnosis not present

## 2020-07-11 DIAGNOSIS — I69391 Dysphagia following cerebral infarction: Secondary | ICD-10-CM | POA: Diagnosis not present

## 2020-07-11 DIAGNOSIS — Z7901 Long term (current) use of anticoagulants: Secondary | ICD-10-CM | POA: Diagnosis not present

## 2020-07-11 DIAGNOSIS — I69311 Memory deficit following cerebral infarction: Secondary | ICD-10-CM | POA: Diagnosis not present

## 2020-07-11 DIAGNOSIS — E782 Mixed hyperlipidemia: Secondary | ICD-10-CM | POA: Diagnosis not present

## 2020-07-11 DIAGNOSIS — M9689 Other intraoperative and postprocedural complications and disorders of the musculoskeletal system: Secondary | ICD-10-CM | POA: Diagnosis not present

## 2020-07-11 DIAGNOSIS — Z9181 History of falling: Secondary | ICD-10-CM | POA: Diagnosis not present

## 2020-07-11 DIAGNOSIS — I428 Other cardiomyopathies: Secondary | ICD-10-CM | POA: Diagnosis not present

## 2020-07-11 DIAGNOSIS — I5022 Chronic systolic (congestive) heart failure: Secondary | ICD-10-CM | POA: Diagnosis not present

## 2020-07-11 DIAGNOSIS — R531 Weakness: Secondary | ICD-10-CM | POA: Diagnosis not present

## 2020-07-11 DIAGNOSIS — I110 Hypertensive heart disease with heart failure: Secondary | ICD-10-CM | POA: Diagnosis not present

## 2020-07-11 DIAGNOSIS — E119 Type 2 diabetes mellitus without complications: Secondary | ICD-10-CM | POA: Diagnosis not present

## 2020-07-11 DIAGNOSIS — Z79899 Other long term (current) drug therapy: Secondary | ICD-10-CM | POA: Diagnosis not present

## 2020-07-11 DIAGNOSIS — I700 Atherosclerosis of aorta: Secondary | ICD-10-CM | POA: Diagnosis not present

## 2020-07-11 DIAGNOSIS — M810 Age-related osteoporosis without current pathological fracture: Secondary | ICD-10-CM | POA: Diagnosis not present

## 2020-07-11 DIAGNOSIS — Y848 Other medical procedures as the cause of abnormal reaction of the patient, or of later complication, without mention of misadventure at the time of the procedure: Secondary | ICD-10-CM | POA: Diagnosis not present

## 2020-07-11 DIAGNOSIS — I480 Paroxysmal atrial fibrillation: Secondary | ICD-10-CM | POA: Diagnosis not present

## 2020-07-11 NOTE — Telephone Encounter (Signed)
Jim from [place] calling for PT verbal orders as follows:  1 time(s) weekly for 1 week(s), then 2 time(s) weekly for 4 week(s).   Rosanne Ashing also wants provider to be aware that patient is reporting uncontrolled pain at 8/10 and is not complaint with checking blood sugars. Patient is also living alone with memory impairment.   Rosanne Ashing also requests speech therapist consult for difficulty with swallowing.   Verbal orders given per [facility] protocol  Veronda Prude, RN

## 2020-07-16 ENCOUNTER — Other Ambulatory Visit: Payer: Self-pay

## 2020-07-16 DIAGNOSIS — I110 Hypertensive heart disease with heart failure: Secondary | ICD-10-CM | POA: Diagnosis not present

## 2020-07-16 DIAGNOSIS — J9811 Atelectasis: Secondary | ICD-10-CM | POA: Diagnosis not present

## 2020-07-16 DIAGNOSIS — Z7901 Long term (current) use of anticoagulants: Secondary | ICD-10-CM | POA: Diagnosis not present

## 2020-07-16 DIAGNOSIS — Z9181 History of falling: Secondary | ICD-10-CM | POA: Diagnosis not present

## 2020-07-16 DIAGNOSIS — Z79899 Other long term (current) drug therapy: Secondary | ICD-10-CM | POA: Diagnosis not present

## 2020-07-16 DIAGNOSIS — M9689 Other intraoperative and postprocedural complications and disorders of the musculoskeletal system: Secondary | ICD-10-CM | POA: Diagnosis not present

## 2020-07-16 DIAGNOSIS — E119 Type 2 diabetes mellitus without complications: Secondary | ICD-10-CM | POA: Diagnosis not present

## 2020-07-16 DIAGNOSIS — I480 Paroxysmal atrial fibrillation: Secondary | ICD-10-CM | POA: Diagnosis not present

## 2020-07-16 DIAGNOSIS — I69311 Memory deficit following cerebral infarction: Secondary | ICD-10-CM | POA: Diagnosis not present

## 2020-07-16 DIAGNOSIS — Y848 Other medical procedures as the cause of abnormal reaction of the patient, or of later complication, without mention of misadventure at the time of the procedure: Secondary | ICD-10-CM | POA: Diagnosis not present

## 2020-07-16 DIAGNOSIS — I5022 Chronic systolic (congestive) heart failure: Secondary | ICD-10-CM | POA: Diagnosis not present

## 2020-07-16 DIAGNOSIS — I69391 Dysphagia following cerebral infarction: Secondary | ICD-10-CM | POA: Diagnosis not present

## 2020-07-16 DIAGNOSIS — I700 Atherosclerosis of aorta: Secondary | ICD-10-CM | POA: Diagnosis not present

## 2020-07-16 DIAGNOSIS — R531 Weakness: Secondary | ICD-10-CM | POA: Diagnosis not present

## 2020-07-16 DIAGNOSIS — K219 Gastro-esophageal reflux disease without esophagitis: Secondary | ICD-10-CM | POA: Diagnosis not present

## 2020-07-16 DIAGNOSIS — E782 Mixed hyperlipidemia: Secondary | ICD-10-CM | POA: Diagnosis not present

## 2020-07-16 DIAGNOSIS — I69398 Other sequelae of cerebral infarction: Secondary | ICD-10-CM | POA: Diagnosis not present

## 2020-07-16 DIAGNOSIS — I428 Other cardiomyopathies: Secondary | ICD-10-CM | POA: Diagnosis not present

## 2020-07-16 NOTE — Telephone Encounter (Signed)
[name] [name] [facility] RN calls nurse line with patient requesting a refill on oxycodone. Patients ribs were broken during CPR during recent hospital stay. Patient has an apt on 7/13. Please advise.

## 2020-07-17 ENCOUNTER — Other Ambulatory Visit: Payer: Self-pay

## 2020-07-17 ENCOUNTER — Other Ambulatory Visit: Payer: Self-pay | Admitting: Family Medicine

## 2020-07-17 ENCOUNTER — Telehealth: Payer: Self-pay

## 2020-07-17 DIAGNOSIS — I480 Paroxysmal atrial fibrillation: Secondary | ICD-10-CM | POA: Diagnosis not present

## 2020-07-17 DIAGNOSIS — I5022 Chronic systolic (congestive) heart failure: Secondary | ICD-10-CM | POA: Diagnosis not present

## 2020-07-17 DIAGNOSIS — Z79899 Other long term (current) drug therapy: Secondary | ICD-10-CM | POA: Diagnosis not present

## 2020-07-17 DIAGNOSIS — M9689 Other intraoperative and postprocedural complications and disorders of the musculoskeletal system: Secondary | ICD-10-CM | POA: Diagnosis not present

## 2020-07-17 DIAGNOSIS — K219 Gastro-esophageal reflux disease without esophagitis: Secondary | ICD-10-CM | POA: Diagnosis not present

## 2020-07-17 DIAGNOSIS — Y848 Other medical procedures as the cause of abnormal reaction of the patient, or of later complication, without mention of misadventure at the time of the procedure: Secondary | ICD-10-CM | POA: Diagnosis not present

## 2020-07-17 DIAGNOSIS — I700 Atherosclerosis of aorta: Secondary | ICD-10-CM | POA: Diagnosis not present

## 2020-07-17 DIAGNOSIS — I69398 Other sequelae of cerebral infarction: Secondary | ICD-10-CM | POA: Diagnosis not present

## 2020-07-17 DIAGNOSIS — I69311 Memory deficit following cerebral infarction: Secondary | ICD-10-CM | POA: Diagnosis not present

## 2020-07-17 DIAGNOSIS — Z9181 History of falling: Secondary | ICD-10-CM | POA: Diagnosis not present

## 2020-07-17 DIAGNOSIS — R531 Weakness: Secondary | ICD-10-CM | POA: Diagnosis not present

## 2020-07-17 DIAGNOSIS — I110 Hypertensive heart disease with heart failure: Secondary | ICD-10-CM | POA: Diagnosis not present

## 2020-07-17 DIAGNOSIS — E119 Type 2 diabetes mellitus without complications: Secondary | ICD-10-CM | POA: Diagnosis not present

## 2020-07-17 DIAGNOSIS — E782 Mixed hyperlipidemia: Secondary | ICD-10-CM | POA: Diagnosis not present

## 2020-07-17 DIAGNOSIS — J9811 Atelectasis: Secondary | ICD-10-CM | POA: Diagnosis not present

## 2020-07-17 DIAGNOSIS — I428 Other cardiomyopathies: Secondary | ICD-10-CM | POA: Diagnosis not present

## 2020-07-17 DIAGNOSIS — I69391 Dysphagia following cerebral infarction: Secondary | ICD-10-CM | POA: Diagnosis not present

## 2020-07-17 DIAGNOSIS — E1165 Type 2 diabetes mellitus with hyperglycemia: Secondary | ICD-10-CM

## 2020-07-17 DIAGNOSIS — Z7901 Long term (current) use of anticoagulants: Secondary | ICD-10-CM | POA: Diagnosis not present

## 2020-07-17 MED ORDER — FERROUS GLUCONATE 324 (38 FE) MG PO TABS: 324.0000 mg | ORAL_TABLET | Freq: Every day | ORAL | 2 refills | Status: DC

## 2020-07-17 MED ORDER — DIGOXIN 125 MCG PO TABS: 0.1250 mg | ORAL_TABLET | Freq: Every day | ORAL | Status: DC

## 2020-07-17 MED ORDER — ONETOUCH VERIO VI STRP: ORAL_STRIP | 12 refills | Status: DC

## 2020-07-17 MED ORDER — ONETOUCH DELICA LANCING DEV MISC: 0 refills | Status: DC

## 2020-07-17 MED ORDER — AMLODIPINE BESYLATE 5 MG PO TABS: 5.0000 mg | ORAL_TABLET | Freq: Every day | ORAL | 0 refills | Status: DC

## 2020-07-17 MED ORDER — AMIODARONE HCL 200 MG PO TABS: 200.0000 mg | ORAL_TABLET | Freq: Every day | ORAL | 1 refills | Status: DC

## 2020-07-17 MED ORDER — ONETOUCH DELICA LANCETS 33G MISC: 12 refills | Status: DC

## 2020-07-17 MED ORDER — APIXABAN 5 MG PO TABS: ORAL_TABLET | ORAL | 1 refills | Status: DC

## 2020-07-17 MED ORDER — ONETOUCH VERIO W/DEVICE KIT: PACK | 0 refills | Status: DC

## 2020-07-17 MED ORDER — ATORVASTATIN CALCIUM 80 MG PO TABS: 80.0000 mg | ORAL_TABLET | Freq: Every day | ORAL | Status: DC

## 2020-07-17 NOTE — Telephone Encounter (Signed)
Patient calls nurse line stating her insurance will not pay for the medications called in by Dr. Lucianne Muss, as they are not her PCP. Patient is asking PCP to fill these. Please advise.

## 2020-07-17 NOTE — Telephone Encounter (Signed)
Sent in prescription but she will need to follow up with her Cardiologist.  Please let her know. I will also see her next week to discuss any concerns she may have or she can see any provider sooner if needed.  Thank you Dana Allan, MD Family Medicine Residency

## 2020-07-17 NOTE — Telephone Encounter (Signed)
Patient advised of medications to the pharmacy and need to FU with cardiologist. Apt scheduled for 7/13.

## 2020-07-17 NOTE — Telephone Encounter (Signed)
Received phone call from patient's home health nurse regarding need for new glucometer and supplies. Orders pended to this encounter. Unable to send per protocol, as patient has not had diabetic check up in the last three months.   Forwarding to PCP  Veronda Prude, RN

## 2020-07-18 DIAGNOSIS — I69398 Other sequelae of cerebral infarction: Secondary | ICD-10-CM | POA: Diagnosis not present

## 2020-07-18 DIAGNOSIS — I110 Hypertensive heart disease with heart failure: Secondary | ICD-10-CM | POA: Diagnosis not present

## 2020-07-18 DIAGNOSIS — Z7901 Long term (current) use of anticoagulants: Secondary | ICD-10-CM | POA: Diagnosis not present

## 2020-07-18 DIAGNOSIS — K219 Gastro-esophageal reflux disease without esophagitis: Secondary | ICD-10-CM | POA: Diagnosis not present

## 2020-07-18 DIAGNOSIS — I69311 Memory deficit following cerebral infarction: Secondary | ICD-10-CM | POA: Diagnosis not present

## 2020-07-18 DIAGNOSIS — Z79899 Other long term (current) drug therapy: Secondary | ICD-10-CM | POA: Diagnosis not present

## 2020-07-18 DIAGNOSIS — I428 Other cardiomyopathies: Secondary | ICD-10-CM | POA: Diagnosis not present

## 2020-07-18 DIAGNOSIS — I480 Paroxysmal atrial fibrillation: Secondary | ICD-10-CM | POA: Diagnosis not present

## 2020-07-18 DIAGNOSIS — R531 Weakness: Secondary | ICD-10-CM | POA: Diagnosis not present

## 2020-07-18 DIAGNOSIS — E119 Type 2 diabetes mellitus without complications: Secondary | ICD-10-CM | POA: Diagnosis not present

## 2020-07-18 DIAGNOSIS — Y848 Other medical procedures as the cause of abnormal reaction of the patient, or of later complication, without mention of misadventure at the time of the procedure: Secondary | ICD-10-CM | POA: Diagnosis not present

## 2020-07-18 DIAGNOSIS — Z9181 History of falling: Secondary | ICD-10-CM | POA: Diagnosis not present

## 2020-07-18 DIAGNOSIS — I69391 Dysphagia following cerebral infarction: Secondary | ICD-10-CM | POA: Diagnosis not present

## 2020-07-18 DIAGNOSIS — M9689 Other intraoperative and postprocedural complications and disorders of the musculoskeletal system: Secondary | ICD-10-CM | POA: Diagnosis not present

## 2020-07-18 DIAGNOSIS — I5022 Chronic systolic (congestive) heart failure: Secondary | ICD-10-CM | POA: Diagnosis not present

## 2020-07-18 DIAGNOSIS — I700 Atherosclerosis of aorta: Secondary | ICD-10-CM | POA: Diagnosis not present

## 2020-07-18 DIAGNOSIS — E782 Mixed hyperlipidemia: Secondary | ICD-10-CM | POA: Diagnosis not present

## 2020-07-18 DIAGNOSIS — J9811 Atelectasis: Secondary | ICD-10-CM | POA: Diagnosis not present

## 2020-07-18 MED ORDER — OXYCODONE HCL 5 MG PO TABS: 2.5000 mg | ORAL_TABLET | Freq: Three times a day (TID) | ORAL | 0 refills | Status: AC | PRN

## 2020-07-19 ENCOUNTER — Other Ambulatory Visit: Payer: Self-pay | Admitting: Family Medicine

## 2020-07-19 DIAGNOSIS — E119 Type 2 diabetes mellitus without complications: Secondary | ICD-10-CM | POA: Diagnosis not present

## 2020-07-19 DIAGNOSIS — I480 Paroxysmal atrial fibrillation: Secondary | ICD-10-CM | POA: Diagnosis not present

## 2020-07-19 DIAGNOSIS — Y848 Other medical procedures as the cause of abnormal reaction of the patient, or of later complication, without mention of misadventure at the time of the procedure: Secondary | ICD-10-CM | POA: Diagnosis not present

## 2020-07-19 DIAGNOSIS — I5022 Chronic systolic (congestive) heart failure: Secondary | ICD-10-CM | POA: Diagnosis not present

## 2020-07-19 DIAGNOSIS — K219 Gastro-esophageal reflux disease without esophagitis: Secondary | ICD-10-CM | POA: Diagnosis not present

## 2020-07-19 DIAGNOSIS — I69311 Memory deficit following cerebral infarction: Secondary | ICD-10-CM | POA: Diagnosis not present

## 2020-07-19 DIAGNOSIS — I110 Hypertensive heart disease with heart failure: Secondary | ICD-10-CM | POA: Diagnosis not present

## 2020-07-19 DIAGNOSIS — R531 Weakness: Secondary | ICD-10-CM | POA: Diagnosis not present

## 2020-07-19 DIAGNOSIS — I69391 Dysphagia following cerebral infarction: Secondary | ICD-10-CM | POA: Diagnosis not present

## 2020-07-19 DIAGNOSIS — I69398 Other sequelae of cerebral infarction: Secondary | ICD-10-CM | POA: Diagnosis not present

## 2020-07-19 DIAGNOSIS — Z9181 History of falling: Secondary | ICD-10-CM | POA: Diagnosis not present

## 2020-07-19 DIAGNOSIS — Z7901 Long term (current) use of anticoagulants: Secondary | ICD-10-CM | POA: Diagnosis not present

## 2020-07-19 DIAGNOSIS — M9689 Other intraoperative and postprocedural complications and disorders of the musculoskeletal system: Secondary | ICD-10-CM | POA: Diagnosis not present

## 2020-07-19 DIAGNOSIS — E782 Mixed hyperlipidemia: Secondary | ICD-10-CM | POA: Diagnosis not present

## 2020-07-19 DIAGNOSIS — I700 Atherosclerosis of aorta: Secondary | ICD-10-CM | POA: Diagnosis not present

## 2020-07-19 DIAGNOSIS — I428 Other cardiomyopathies: Secondary | ICD-10-CM | POA: Diagnosis not present

## 2020-07-19 DIAGNOSIS — J9811 Atelectasis: Secondary | ICD-10-CM | POA: Diagnosis not present

## 2020-07-19 DIAGNOSIS — Z79899 Other long term (current) drug therapy: Secondary | ICD-10-CM | POA: Diagnosis not present

## 2020-07-21 ENCOUNTER — Other Ambulatory Visit: Payer: Self-pay | Admitting: Family Medicine

## 2020-07-21 ENCOUNTER — Telehealth: Payer: Self-pay

## 2020-07-21 DIAGNOSIS — R531 Weakness: Secondary | ICD-10-CM | POA: Diagnosis not present

## 2020-07-21 DIAGNOSIS — K219 Gastro-esophageal reflux disease without esophagitis: Secondary | ICD-10-CM | POA: Diagnosis not present

## 2020-07-21 DIAGNOSIS — I69311 Memory deficit following cerebral infarction: Secondary | ICD-10-CM | POA: Diagnosis not present

## 2020-07-21 DIAGNOSIS — I480 Paroxysmal atrial fibrillation: Secondary | ICD-10-CM | POA: Diagnosis not present

## 2020-07-21 DIAGNOSIS — Z9181 History of falling: Secondary | ICD-10-CM | POA: Diagnosis not present

## 2020-07-21 DIAGNOSIS — J9811 Atelectasis: Secondary | ICD-10-CM | POA: Diagnosis not present

## 2020-07-21 DIAGNOSIS — Z7901 Long term (current) use of anticoagulants: Secondary | ICD-10-CM | POA: Diagnosis not present

## 2020-07-21 DIAGNOSIS — I69398 Other sequelae of cerebral infarction: Secondary | ICD-10-CM | POA: Diagnosis not present

## 2020-07-21 DIAGNOSIS — Z79899 Other long term (current) drug therapy: Secondary | ICD-10-CM | POA: Diagnosis not present

## 2020-07-21 DIAGNOSIS — I69391 Dysphagia following cerebral infarction: Secondary | ICD-10-CM | POA: Diagnosis not present

## 2020-07-21 DIAGNOSIS — M9689 Other intraoperative and postprocedural complications and disorders of the musculoskeletal system: Secondary | ICD-10-CM | POA: Diagnosis not present

## 2020-07-21 DIAGNOSIS — I5022 Chronic systolic (congestive) heart failure: Secondary | ICD-10-CM | POA: Diagnosis not present

## 2020-07-21 DIAGNOSIS — I110 Hypertensive heart disease with heart failure: Secondary | ICD-10-CM | POA: Diagnosis not present

## 2020-07-21 DIAGNOSIS — E119 Type 2 diabetes mellitus without complications: Secondary | ICD-10-CM | POA: Diagnosis not present

## 2020-07-21 DIAGNOSIS — E782 Mixed hyperlipidemia: Secondary | ICD-10-CM | POA: Diagnosis not present

## 2020-07-21 DIAGNOSIS — I700 Atherosclerosis of aorta: Secondary | ICD-10-CM | POA: Diagnosis not present

## 2020-07-21 DIAGNOSIS — Y848 Other medical procedures as the cause of abnormal reaction of the patient, or of later complication, without mention of misadventure at the time of the procedure: Secondary | ICD-10-CM | POA: Diagnosis not present

## 2020-07-21 DIAGNOSIS — I428 Other cardiomyopathies: Secondary | ICD-10-CM | POA: Diagnosis not present

## 2020-07-21 MED ORDER — PANTOPRAZOLE SODIUM 40 MG PO TBEC: 40.0000 mg | DELAYED_RELEASE_TABLET | Freq: Every day | ORAL | Status: DC

## 2020-07-21 NOTE — Telephone Encounter (Signed)
Jim [facility] PT LVM on nurse line reporting abnormal BP in patient at today's visit. Rosanne Ashing reports BP 160/90, however this was before she took her BP meds. Rosanne Ashing advised patient to take medication and monitor BP this evening. Rosanne Ashing reports she is asymptomatic and has a visit scheduled with [facility] on 7/13.

## 2020-07-23 ENCOUNTER — Other Ambulatory Visit: Payer: Self-pay

## 2020-07-23 ENCOUNTER — Ambulatory Visit (INDEPENDENT_AMBULATORY_CARE_PROVIDER_SITE_OTHER): Payer: Medicare Other | Admitting: Family Medicine

## 2020-07-23 VITALS — BP 132/59 | HR 88 | Ht 63.0 in | Wt 168.8 lb

## 2020-07-23 DIAGNOSIS — I5042 Chronic combined systolic (congestive) and diastolic (congestive) heart failure: Secondary | ICD-10-CM

## 2020-07-23 DIAGNOSIS — I110 Hypertensive heart disease with heart failure: Secondary | ICD-10-CM | POA: Diagnosis not present

## 2020-07-23 DIAGNOSIS — E782 Mixed hyperlipidemia: Secondary | ICD-10-CM | POA: Diagnosis not present

## 2020-07-23 DIAGNOSIS — K219 Gastro-esophageal reflux disease without esophagitis: Secondary | ICD-10-CM | POA: Diagnosis not present

## 2020-07-23 DIAGNOSIS — M9689 Other intraoperative and postprocedural complications and disorders of the musculoskeletal system: Secondary | ICD-10-CM | POA: Diagnosis not present

## 2020-07-23 DIAGNOSIS — E1165 Type 2 diabetes mellitus with hyperglycemia: Secondary | ICD-10-CM | POA: Diagnosis not present

## 2020-07-23 DIAGNOSIS — Y848 Other medical procedures as the cause of abnormal reaction of the patient, or of later complication, without mention of misadventure at the time of the procedure: Secondary | ICD-10-CM | POA: Diagnosis not present

## 2020-07-23 DIAGNOSIS — Z09 Encounter for follow-up examination after completed treatment for conditions other than malignant neoplasm: Secondary | ICD-10-CM

## 2020-07-23 DIAGNOSIS — I69391 Dysphagia following cerebral infarction: Secondary | ICD-10-CM | POA: Diagnosis not present

## 2020-07-23 DIAGNOSIS — I480 Paroxysmal atrial fibrillation: Secondary | ICD-10-CM | POA: Diagnosis not present

## 2020-07-23 DIAGNOSIS — Z7901 Long term (current) use of anticoagulants: Secondary | ICD-10-CM | POA: Diagnosis not present

## 2020-07-23 DIAGNOSIS — I69398 Other sequelae of cerebral infarction: Secondary | ICD-10-CM | POA: Diagnosis not present

## 2020-07-23 DIAGNOSIS — R531 Weakness: Secondary | ICD-10-CM | POA: Diagnosis not present

## 2020-07-23 DIAGNOSIS — I69311 Memory deficit following cerebral infarction: Secondary | ICD-10-CM | POA: Diagnosis not present

## 2020-07-23 DIAGNOSIS — I5022 Chronic systolic (congestive) heart failure: Secondary | ICD-10-CM | POA: Diagnosis not present

## 2020-07-23 DIAGNOSIS — Z79899 Other long term (current) drug therapy: Secondary | ICD-10-CM | POA: Diagnosis not present

## 2020-07-23 DIAGNOSIS — J9811 Atelectasis: Secondary | ICD-10-CM | POA: Diagnosis not present

## 2020-07-23 DIAGNOSIS — I700 Atherosclerosis of aorta: Secondary | ICD-10-CM | POA: Diagnosis not present

## 2020-07-23 DIAGNOSIS — Z9181 History of falling: Secondary | ICD-10-CM | POA: Diagnosis not present

## 2020-07-23 DIAGNOSIS — E119 Type 2 diabetes mellitus without complications: Secondary | ICD-10-CM | POA: Diagnosis not present

## 2020-07-23 DIAGNOSIS — I428 Other cardiomyopathies: Secondary | ICD-10-CM | POA: Diagnosis not present

## 2020-07-23 LAB — POCT GLYCOSYLATED HEMOGLOBIN (HGB A1C): HbA1c, POC (controlled diabetic range): 7.6 % — AB (ref 0.0–7.0)

## 2020-07-23 MED ORDER — DIGOXIN 125 MCG PO TABS: 0.1250 mg | ORAL_TABLET | Freq: Every day | ORAL | 0 refills | Status: DC

## 2020-07-23 MED ORDER — SENNOSIDES-DOCUSATE SODIUM 8.6-50 MG PO TABS: 2.0000 | ORAL_TABLET | Freq: Two times a day (BID) | ORAL | 2 refills | Status: DC

## 2020-07-23 MED ORDER — ISOSORB DINITRATE-HYDRALAZINE 20-37.5 MG PO TABS: 2.0000 | ORAL_TABLET | Freq: Three times a day (TID) | ORAL | 0 refills | Status: DC

## 2020-07-23 MED ORDER — PANTOPRAZOLE SODIUM 40 MG PO TBEC: 40.0000 mg | DELAYED_RELEASE_TABLET | Freq: Every day | ORAL | 1 refills | Status: DC

## 2020-07-23 MED ORDER — LIDOCAINE 5 % EX PTCH: 1.0000 | MEDICATED_PATCH | CUTANEOUS | 0 refills | Status: DC

## 2020-07-23 MED ORDER — APIXABAN 5 MG PO TABS: ORAL_TABLET | ORAL | 1 refills | Status: DC

## 2020-07-23 MED ORDER — ATORVASTATIN CALCIUM 80 MG PO TABS: 80.0000 mg | ORAL_TABLET | Freq: Every day | ORAL | 1 refills | Status: DC

## 2020-07-23 NOTE — Progress Notes (Signed)
    SUBJECTIVE:   CHIEF COMPLAINT / HPI:  hospital follow up and medication refill  Patient admitted to hospital between 05/27 and 06/11 and treated for Large loculated pleural effusion. Discharged on 06/11 to SNF and is now back at home. Presents today for follow up. Reports feeling good. Denies any shortness of breath,chest pain, nausea or vomiting and no abdominal pain. Requires repeat BMP/ renal function panel/CBC today.  She would like to be referred to Dr Yates Decamp whom she has seen in the past.  Current medications since discharged from last hospital admission: -Amiodarone 200 mg daily -Amlodipine 5 mg daily -Atorvastatin 80 mg daily -Digoxin 0.125 mg daily -Eliquis 5 mg twice daily -Bidil 20 mg-37.5 mg 2 tablets three times a day -Ozempic 0.5 mg weekly -Lantus 18 units daily -Protonix 40 mg daily -Miralax prn -Dulcolax suppository prn -Stimulant 8.6- 50 mg 2 tablets twice daily  -Ferrous Gluconate 324 mg daily -Flexeril 5 mg prn -Oxycodone 2.5 mg q8h as needed -Tylenol 500 mg 1.5 tabs as needed   PERTINENT  PMH / PSH:  HTN HLD DM Type 2 uncontrolled PE A-Fib PEA Arrest CVA Combined CHF   OBJECTIVE:   BP (!) 132/59   Pulse 88   Ht 5\' 3"  (1.6 m)   Wt 168 lb 12.8 oz (76.6 kg)   SpO2 100%   BMI 29.90 kg/m    General: Alert, no acute distress Cardio: Normal S1 and S2, RRR, no r/m/g Pulm: CTAB, normal work of breathing Abdomen: Bowel sounds normal. Abdomen soft and non-tender.  Extremities: No peripheral edema.   ASSESSMENT/PLAN:   Hospital discharge follow-up Feeling better since discharged.  I have reviewed all of her current medications and reordered any medications that was needed.  She will need to follow up with Cardiology and she would like a referral to Dr .   -Will obtain labs today, CMet,CBC,Digoxin level -Refer to Cardiology  -Wean Oxycodone 2.5 mg q8h as needed.  I had refilled this last week and she wasn't aware it was ready forpick up.  No fractures from previous admission during CPR and I discussed this with her.We decided to use Tylenol and she would only use the remaining 7 tablets of Oxy if severe pain.   -Follow up with PCP in 2-4 weeks or sooner if needed     Jacinto Halim, MD [facility]

## 2020-07-23 NOTE — Patient Instructions (Signed)
Thank you for coming to see me today. It was a pleasure.    Please make an appointment to follow up with your Cardiologist as soon as possible.  We will get some labs today.  If they are abnormal or we need to do something about them, I will call you.  If they are normal, I will send you a message on MyChart (if it is active) or a letter in the mail.  If you don't hear from [place] in 2 weeks, please call the office at the number below.   Please bring in all your medications at your next visit  Please follow-up with PCP in 3 months  If you have any questions or concerns, please do not hesitate to call the office at [phone].  Best,   Dana Allan, MD

## 2020-07-24 DIAGNOSIS — E782 Mixed hyperlipidemia: Secondary | ICD-10-CM | POA: Diagnosis not present

## 2020-07-24 DIAGNOSIS — K219 Gastro-esophageal reflux disease without esophagitis: Secondary | ICD-10-CM | POA: Diagnosis not present

## 2020-07-24 DIAGNOSIS — I480 Paroxysmal atrial fibrillation: Secondary | ICD-10-CM | POA: Diagnosis not present

## 2020-07-24 DIAGNOSIS — Z7901 Long term (current) use of anticoagulants: Secondary | ICD-10-CM | POA: Diagnosis not present

## 2020-07-24 DIAGNOSIS — E119 Type 2 diabetes mellitus without complications: Secondary | ICD-10-CM | POA: Diagnosis not present

## 2020-07-24 DIAGNOSIS — I428 Other cardiomyopathies: Secondary | ICD-10-CM | POA: Diagnosis not present

## 2020-07-24 DIAGNOSIS — R531 Weakness: Secondary | ICD-10-CM | POA: Diagnosis not present

## 2020-07-24 DIAGNOSIS — J9811 Atelectasis: Secondary | ICD-10-CM | POA: Diagnosis not present

## 2020-07-24 DIAGNOSIS — M9689 Other intraoperative and postprocedural complications and disorders of the musculoskeletal system: Secondary | ICD-10-CM | POA: Diagnosis not present

## 2020-07-24 DIAGNOSIS — I69311 Memory deficit following cerebral infarction: Secondary | ICD-10-CM | POA: Diagnosis not present

## 2020-07-24 DIAGNOSIS — Z9181 History of falling: Secondary | ICD-10-CM | POA: Diagnosis not present

## 2020-07-24 DIAGNOSIS — I69398 Other sequelae of cerebral infarction: Secondary | ICD-10-CM | POA: Diagnosis not present

## 2020-07-24 DIAGNOSIS — I69391 Dysphagia following cerebral infarction: Secondary | ICD-10-CM | POA: Diagnosis not present

## 2020-07-24 DIAGNOSIS — I110 Hypertensive heart disease with heart failure: Secondary | ICD-10-CM | POA: Diagnosis not present

## 2020-07-24 DIAGNOSIS — I700 Atherosclerosis of aorta: Secondary | ICD-10-CM | POA: Diagnosis not present

## 2020-07-24 DIAGNOSIS — Y848 Other medical procedures as the cause of abnormal reaction of the patient, or of later complication, without mention of misadventure at the time of the procedure: Secondary | ICD-10-CM | POA: Diagnosis not present

## 2020-07-24 DIAGNOSIS — I5022 Chronic systolic (congestive) heart failure: Secondary | ICD-10-CM | POA: Diagnosis not present

## 2020-07-24 DIAGNOSIS — Z79899 Other long term (current) drug therapy: Secondary | ICD-10-CM | POA: Diagnosis not present

## 2020-07-24 LAB — COMPREHENSIVE METABOLIC PANEL
ALT: 25 IU/L (ref 0–32)
AST: 37 IU/L (ref 0–40)
Albumin/Globulin Ratio: 1.2 (ref 1.2–2.2)
Albumin: 4.3 g/dL (ref 3.8–4.8)
Alkaline Phosphatase: 89 IU/L (ref 44–121)
BUN/Creatinine Ratio: 17 (ref 12–28)
BUN: 13 mg/dL (ref 8–27)
Bilirubin Total: <0.2 mg/dL (ref 0.0–1.2)
CO2: 18 mmol/L — ABNORMAL LOW (ref 20–29)
Calcium: 9.8 mg/dL (ref 8.7–10.3)
Chloride: 97 mmol/L (ref 96–106)
Creatinine, Ser: 0.78 mg/dL (ref 0.57–1.00)
Globulin, Total: 3.5 g/dL (ref 1.5–4.5)
Glucose: 214 mg/dL — ABNORMAL HIGH (ref 65–99)
Potassium: 4.3 mmol/L (ref 3.5–5.2)
Sodium: 137 mmol/L (ref 134–144)
Total Protein: 7.8 g/dL (ref 6.0–8.5)
eGFR: 83 mL/min/{1.73_m2} (ref >59)

## 2020-07-24 LAB — CBC WITH DIFFERENTIAL/PLATELET
Basophils Absolute: 0 10*3/uL (ref 0.0–0.2)
Basos: 1 %
EOS (ABSOLUTE): 0 10*3/uL (ref 0.0–0.4)
Eos: 1 %
Hematocrit: 43.7 % (ref 34.0–46.6)
Hemoglobin: 13 g/dL (ref 11.1–15.9)
Immature Grans (Abs): 0 10*3/uL (ref 0.0–0.1)
Immature Granulocytes: 0 %
Lymphocytes Absolute: 2.3 10*3/uL (ref 0.7–3.1)
Lymphs: 34 %
MCH: 25.1 pg — ABNORMAL LOW (ref 26.6–33.0)
MCHC: 29.7 g/dL — ABNORMAL LOW (ref 31.5–35.7)
MCV: 85 fL (ref 79–97)
Monocytes Absolute: 0.6 10*3/uL (ref 0.1–0.9)
Monocytes: 8 %
Neutrophils Absolute: 3.8 10*3/uL (ref 1.4–7.0)
Neutrophils: 56 %
Platelets: 291 10*3/uL (ref 150–450)
RBC: 5.17 x10E6/uL (ref 3.77–5.28)
RDW: 17.6 % — ABNORMAL HIGH (ref 11.7–15.4)
WBC: 6.8 10*3/uL (ref 3.4–10.8)

## 2020-07-24 LAB — DIGOXIN LEVEL: Digoxin, Serum: 0.7 ng/mL (ref 0.5–0.9)

## 2020-07-27 ENCOUNTER — Encounter: Payer: Self-pay | Admitting: Family Medicine

## 2020-07-27 DIAGNOSIS — Z09 Encounter for follow-up examination after completed treatment for conditions other than malignant neoplasm: Secondary | ICD-10-CM

## 2020-07-27 HISTORY — DX: Encounter for follow-up examination after completed treatment for conditions other than malignant neoplasm: Z09

## 2020-07-27 NOTE — Assessment & Plan Note (Addendum)
Feeling better since discharged.  I have reviewed all of her current medications and reordered any medications that was needed.  She will need to follow up with Cardiology and she would like a referral to Dr Jacinto Halim.   -Will obtain labs today, CMet,CBC,Digoxin level -Refer to Cardiology  -Wean Oxycodone 2.5 mg q8h as needed.  I had refilled this last week and she wasn't aware it was ready forpick up. No fractures from previous admission during CPR and I discussed this with her.We decided to use Tylenol and she would only use the remaining 7 tablets of Oxy if severe pain.   -Follow up with PCP in 2-4 weeks or sooner if needed

## 2020-07-29 DIAGNOSIS — R531 Weakness: Secondary | ICD-10-CM | POA: Diagnosis not present

## 2020-07-29 DIAGNOSIS — Y848 Other medical procedures as the cause of abnormal reaction of the patient, or of later complication, without mention of misadventure at the time of the procedure: Secondary | ICD-10-CM | POA: Diagnosis not present

## 2020-07-29 DIAGNOSIS — I110 Hypertensive heart disease with heart failure: Secondary | ICD-10-CM | POA: Diagnosis not present

## 2020-07-29 DIAGNOSIS — E782 Mixed hyperlipidemia: Secondary | ICD-10-CM | POA: Diagnosis not present

## 2020-07-29 DIAGNOSIS — I69398 Other sequelae of cerebral infarction: Secondary | ICD-10-CM | POA: Diagnosis not present

## 2020-07-29 DIAGNOSIS — Z7901 Long term (current) use of anticoagulants: Secondary | ICD-10-CM | POA: Diagnosis not present

## 2020-07-29 DIAGNOSIS — E119 Type 2 diabetes mellitus without complications: Secondary | ICD-10-CM | POA: Diagnosis not present

## 2020-07-29 DIAGNOSIS — I69391 Dysphagia following cerebral infarction: Secondary | ICD-10-CM | POA: Diagnosis not present

## 2020-07-29 DIAGNOSIS — M9689 Other intraoperative and postprocedural complications and disorders of the musculoskeletal system: Secondary | ICD-10-CM | POA: Diagnosis not present

## 2020-07-29 DIAGNOSIS — I700 Atherosclerosis of aorta: Secondary | ICD-10-CM | POA: Diagnosis not present

## 2020-07-29 DIAGNOSIS — J9811 Atelectasis: Secondary | ICD-10-CM | POA: Diagnosis not present

## 2020-07-29 DIAGNOSIS — I428 Other cardiomyopathies: Secondary | ICD-10-CM | POA: Diagnosis not present

## 2020-07-29 DIAGNOSIS — Z79899 Other long term (current) drug therapy: Secondary | ICD-10-CM | POA: Diagnosis not present

## 2020-07-29 DIAGNOSIS — Z9181 History of falling: Secondary | ICD-10-CM | POA: Diagnosis not present

## 2020-07-29 DIAGNOSIS — I480 Paroxysmal atrial fibrillation: Secondary | ICD-10-CM | POA: Diagnosis not present

## 2020-07-29 DIAGNOSIS — K219 Gastro-esophageal reflux disease without esophagitis: Secondary | ICD-10-CM | POA: Diagnosis not present

## 2020-07-29 DIAGNOSIS — I69311 Memory deficit following cerebral infarction: Secondary | ICD-10-CM | POA: Diagnosis not present

## 2020-07-29 DIAGNOSIS — I5022 Chronic systolic (congestive) heart failure: Secondary | ICD-10-CM | POA: Diagnosis not present

## 2020-07-30 DIAGNOSIS — J9811 Atelectasis: Secondary | ICD-10-CM | POA: Diagnosis not present

## 2020-07-30 DIAGNOSIS — I69391 Dysphagia following cerebral infarction: Secondary | ICD-10-CM | POA: Diagnosis not present

## 2020-07-30 DIAGNOSIS — E119 Type 2 diabetes mellitus without complications: Secondary | ICD-10-CM | POA: Diagnosis not present

## 2020-07-30 DIAGNOSIS — K219 Gastro-esophageal reflux disease without esophagitis: Secondary | ICD-10-CM | POA: Diagnosis not present

## 2020-07-30 DIAGNOSIS — I480 Paroxysmal atrial fibrillation: Secondary | ICD-10-CM | POA: Diagnosis not present

## 2020-07-30 DIAGNOSIS — I428 Other cardiomyopathies: Secondary | ICD-10-CM | POA: Diagnosis not present

## 2020-07-30 DIAGNOSIS — E782 Mixed hyperlipidemia: Secondary | ICD-10-CM | POA: Diagnosis not present

## 2020-07-30 DIAGNOSIS — Y848 Other medical procedures as the cause of abnormal reaction of the patient, or of later complication, without mention of misadventure at the time of the procedure: Secondary | ICD-10-CM | POA: Diagnosis not present

## 2020-07-30 DIAGNOSIS — M9689 Other intraoperative and postprocedural complications and disorders of the musculoskeletal system: Secondary | ICD-10-CM | POA: Diagnosis not present

## 2020-07-30 DIAGNOSIS — I110 Hypertensive heart disease with heart failure: Secondary | ICD-10-CM | POA: Diagnosis not present

## 2020-07-30 DIAGNOSIS — I69311 Memory deficit following cerebral infarction: Secondary | ICD-10-CM | POA: Diagnosis not present

## 2020-07-30 DIAGNOSIS — Z7901 Long term (current) use of anticoagulants: Secondary | ICD-10-CM | POA: Diagnosis not present

## 2020-07-30 DIAGNOSIS — I69398 Other sequelae of cerebral infarction: Secondary | ICD-10-CM | POA: Diagnosis not present

## 2020-07-30 DIAGNOSIS — Z9181 History of falling: Secondary | ICD-10-CM | POA: Diagnosis not present

## 2020-07-30 DIAGNOSIS — Z79899 Other long term (current) drug therapy: Secondary | ICD-10-CM | POA: Diagnosis not present

## 2020-07-30 DIAGNOSIS — I700 Atherosclerosis of aorta: Secondary | ICD-10-CM | POA: Diagnosis not present

## 2020-07-30 DIAGNOSIS — I5022 Chronic systolic (congestive) heart failure: Secondary | ICD-10-CM | POA: Diagnosis not present

## 2020-07-30 DIAGNOSIS — R531 Weakness: Secondary | ICD-10-CM | POA: Diagnosis not present

## 2020-07-31 DIAGNOSIS — R531 Weakness: Secondary | ICD-10-CM | POA: Diagnosis not present

## 2020-07-31 DIAGNOSIS — J9811 Atelectasis: Secondary | ICD-10-CM | POA: Diagnosis not present

## 2020-07-31 DIAGNOSIS — I110 Hypertensive heart disease with heart failure: Secondary | ICD-10-CM | POA: Diagnosis not present

## 2020-07-31 DIAGNOSIS — E119 Type 2 diabetes mellitus without complications: Secondary | ICD-10-CM | POA: Diagnosis not present

## 2020-07-31 DIAGNOSIS — I5022 Chronic systolic (congestive) heart failure: Secondary | ICD-10-CM | POA: Diagnosis not present

## 2020-07-31 DIAGNOSIS — M9689 Other intraoperative and postprocedural complications and disorders of the musculoskeletal system: Secondary | ICD-10-CM | POA: Diagnosis not present

## 2020-07-31 DIAGNOSIS — Z79899 Other long term (current) drug therapy: Secondary | ICD-10-CM | POA: Diagnosis not present

## 2020-07-31 DIAGNOSIS — Z9181 History of falling: Secondary | ICD-10-CM | POA: Diagnosis not present

## 2020-07-31 DIAGNOSIS — I700 Atherosclerosis of aorta: Secondary | ICD-10-CM | POA: Diagnosis not present

## 2020-07-31 DIAGNOSIS — E782 Mixed hyperlipidemia: Secondary | ICD-10-CM | POA: Diagnosis not present

## 2020-07-31 DIAGNOSIS — I69311 Memory deficit following cerebral infarction: Secondary | ICD-10-CM | POA: Diagnosis not present

## 2020-07-31 DIAGNOSIS — Z7901 Long term (current) use of anticoagulants: Secondary | ICD-10-CM | POA: Diagnosis not present

## 2020-07-31 DIAGNOSIS — Y848 Other medical procedures as the cause of abnormal reaction of the patient, or of later complication, without mention of misadventure at the time of the procedure: Secondary | ICD-10-CM | POA: Diagnosis not present

## 2020-07-31 DIAGNOSIS — I69398 Other sequelae of cerebral infarction: Secondary | ICD-10-CM | POA: Diagnosis not present

## 2020-07-31 DIAGNOSIS — I428 Other cardiomyopathies: Secondary | ICD-10-CM | POA: Diagnosis not present

## 2020-07-31 DIAGNOSIS — K219 Gastro-esophageal reflux disease without esophagitis: Secondary | ICD-10-CM | POA: Diagnosis not present

## 2020-07-31 DIAGNOSIS — I69391 Dysphagia following cerebral infarction: Secondary | ICD-10-CM | POA: Diagnosis not present

## 2020-07-31 DIAGNOSIS — I480 Paroxysmal atrial fibrillation: Secondary | ICD-10-CM | POA: Diagnosis not present

## 2020-08-01 ENCOUNTER — Telehealth: Payer: Self-pay

## 2020-08-01 DIAGNOSIS — I69311 Memory deficit following cerebral infarction: Secondary | ICD-10-CM | POA: Diagnosis not present

## 2020-08-01 DIAGNOSIS — R531 Weakness: Secondary | ICD-10-CM | POA: Diagnosis not present

## 2020-08-01 DIAGNOSIS — Y848 Other medical procedures as the cause of abnormal reaction of the patient, or of later complication, without mention of misadventure at the time of the procedure: Secondary | ICD-10-CM | POA: Diagnosis not present

## 2020-08-01 DIAGNOSIS — Z79899 Other long term (current) drug therapy: Secondary | ICD-10-CM | POA: Diagnosis not present

## 2020-08-01 DIAGNOSIS — I5022 Chronic systolic (congestive) heart failure: Secondary | ICD-10-CM | POA: Diagnosis not present

## 2020-08-01 DIAGNOSIS — I480 Paroxysmal atrial fibrillation: Secondary | ICD-10-CM | POA: Diagnosis not present

## 2020-08-01 DIAGNOSIS — Z9181 History of falling: Secondary | ICD-10-CM | POA: Diagnosis not present

## 2020-08-01 DIAGNOSIS — E782 Mixed hyperlipidemia: Secondary | ICD-10-CM | POA: Diagnosis not present

## 2020-08-01 DIAGNOSIS — I69398 Other sequelae of cerebral infarction: Secondary | ICD-10-CM | POA: Diagnosis not present

## 2020-08-01 DIAGNOSIS — J9811 Atelectasis: Secondary | ICD-10-CM | POA: Diagnosis not present

## 2020-08-01 DIAGNOSIS — M9689 Other intraoperative and postprocedural complications and disorders of the musculoskeletal system: Secondary | ICD-10-CM | POA: Diagnosis not present

## 2020-08-01 DIAGNOSIS — I69391 Dysphagia following cerebral infarction: Secondary | ICD-10-CM | POA: Diagnosis not present

## 2020-08-01 DIAGNOSIS — I110 Hypertensive heart disease with heart failure: Secondary | ICD-10-CM | POA: Diagnosis not present

## 2020-08-01 DIAGNOSIS — Z7901 Long term (current) use of anticoagulants: Secondary | ICD-10-CM | POA: Diagnosis not present

## 2020-08-01 DIAGNOSIS — K219 Gastro-esophageal reflux disease without esophagitis: Secondary | ICD-10-CM | POA: Diagnosis not present

## 2020-08-01 DIAGNOSIS — I700 Atherosclerosis of aorta: Secondary | ICD-10-CM | POA: Diagnosis not present

## 2020-08-01 DIAGNOSIS — I428 Other cardiomyopathies: Secondary | ICD-10-CM | POA: Diagnosis not present

## 2020-08-01 DIAGNOSIS — E119 Type 2 diabetes mellitus without complications: Secondary | ICD-10-CM | POA: Diagnosis not present

## 2020-08-01 NOTE — Telephone Encounter (Signed)
Needs to book an appointment in [facility] clinic.    Thank you Dana Allan, MD Family Medicine Residency

## 2020-08-01 NOTE — Telephone Encounter (Signed)
Received phone call from PT regarding patient reporting cough for the last few days.   Called patient directly to discuss further. Patient reports that cough has been going on for the last ~3 days. Patient states that it is only in the mornings when she wakes up. Patient reports that she is "coughing up stuff from her stomach" Reports that emesis has been greenish/yellow. Patient states that she has had similar issues in the past with her GERD.   Patient is not running any fever or having any other symptoms. Per HH PT, vitals are WNL.   Provided patient with ED precautions for over the weekend. Please advise any further management for GERD or if patient needs to schedule additional follow up.   Veronda Prude, RN

## 2020-08-04 DIAGNOSIS — I110 Hypertensive heart disease with heart failure: Secondary | ICD-10-CM | POA: Diagnosis not present

## 2020-08-04 DIAGNOSIS — Z9181 History of falling: Secondary | ICD-10-CM | POA: Diagnosis not present

## 2020-08-04 DIAGNOSIS — I69311 Memory deficit following cerebral infarction: Secondary | ICD-10-CM | POA: Diagnosis not present

## 2020-08-04 DIAGNOSIS — Z79899 Other long term (current) drug therapy: Secondary | ICD-10-CM | POA: Diagnosis not present

## 2020-08-04 DIAGNOSIS — R531 Weakness: Secondary | ICD-10-CM | POA: Diagnosis not present

## 2020-08-04 DIAGNOSIS — I69398 Other sequelae of cerebral infarction: Secondary | ICD-10-CM | POA: Diagnosis not present

## 2020-08-04 DIAGNOSIS — I700 Atherosclerosis of aorta: Secondary | ICD-10-CM | POA: Diagnosis not present

## 2020-08-04 DIAGNOSIS — I480 Paroxysmal atrial fibrillation: Secondary | ICD-10-CM | POA: Diagnosis not present

## 2020-08-04 DIAGNOSIS — I428 Other cardiomyopathies: Secondary | ICD-10-CM | POA: Diagnosis not present

## 2020-08-04 DIAGNOSIS — Z7901 Long term (current) use of anticoagulants: Secondary | ICD-10-CM | POA: Diagnosis not present

## 2020-08-04 DIAGNOSIS — I69391 Dysphagia following cerebral infarction: Secondary | ICD-10-CM | POA: Diagnosis not present

## 2020-08-04 DIAGNOSIS — Y848 Other medical procedures as the cause of abnormal reaction of the patient, or of later complication, without mention of misadventure at the time of the procedure: Secondary | ICD-10-CM | POA: Diagnosis not present

## 2020-08-04 DIAGNOSIS — J9811 Atelectasis: Secondary | ICD-10-CM | POA: Diagnosis not present

## 2020-08-04 DIAGNOSIS — I5022 Chronic systolic (congestive) heart failure: Secondary | ICD-10-CM | POA: Diagnosis not present

## 2020-08-04 DIAGNOSIS — E782 Mixed hyperlipidemia: Secondary | ICD-10-CM | POA: Diagnosis not present

## 2020-08-04 DIAGNOSIS — M9689 Other intraoperative and postprocedural complications and disorders of the musculoskeletal system: Secondary | ICD-10-CM | POA: Diagnosis not present

## 2020-08-04 DIAGNOSIS — K219 Gastro-esophageal reflux disease without esophagitis: Secondary | ICD-10-CM | POA: Diagnosis not present

## 2020-08-04 DIAGNOSIS — E119 Type 2 diabetes mellitus without complications: Secondary | ICD-10-CM | POA: Diagnosis not present

## 2020-08-04 NOTE — Telephone Encounter (Signed)
Called patient. Patient reports that cough has resolved over the weekend. Patient requesting to schedule follow up per last OV.   Scheduled patient for follow up visit on 09/03/20.  Veronda Prude, RN

## 2020-08-06 DIAGNOSIS — I69391 Dysphagia following cerebral infarction: Secondary | ICD-10-CM | POA: Diagnosis not present

## 2020-08-06 DIAGNOSIS — E119 Type 2 diabetes mellitus without complications: Secondary | ICD-10-CM | POA: Diagnosis not present

## 2020-08-06 DIAGNOSIS — I110 Hypertensive heart disease with heart failure: Secondary | ICD-10-CM | POA: Diagnosis not present

## 2020-08-06 DIAGNOSIS — E782 Mixed hyperlipidemia: Secondary | ICD-10-CM | POA: Diagnosis not present

## 2020-08-06 DIAGNOSIS — R531 Weakness: Secondary | ICD-10-CM | POA: Diagnosis not present

## 2020-08-06 DIAGNOSIS — Z79899 Other long term (current) drug therapy: Secondary | ICD-10-CM | POA: Diagnosis not present

## 2020-08-06 DIAGNOSIS — I480 Paroxysmal atrial fibrillation: Secondary | ICD-10-CM | POA: Diagnosis not present

## 2020-08-06 DIAGNOSIS — I5022 Chronic systolic (congestive) heart failure: Secondary | ICD-10-CM | POA: Diagnosis not present

## 2020-08-06 DIAGNOSIS — I428 Other cardiomyopathies: Secondary | ICD-10-CM | POA: Diagnosis not present

## 2020-08-06 DIAGNOSIS — Z7901 Long term (current) use of anticoagulants: Secondary | ICD-10-CM | POA: Diagnosis not present

## 2020-08-06 DIAGNOSIS — K219 Gastro-esophageal reflux disease without esophagitis: Secondary | ICD-10-CM | POA: Diagnosis not present

## 2020-08-06 DIAGNOSIS — M9689 Other intraoperative and postprocedural complications and disorders of the musculoskeletal system: Secondary | ICD-10-CM | POA: Diagnosis not present

## 2020-08-06 DIAGNOSIS — I69311 Memory deficit following cerebral infarction: Secondary | ICD-10-CM | POA: Diagnosis not present

## 2020-08-06 DIAGNOSIS — I69398 Other sequelae of cerebral infarction: Secondary | ICD-10-CM | POA: Diagnosis not present

## 2020-08-06 DIAGNOSIS — Z9181 History of falling: Secondary | ICD-10-CM | POA: Diagnosis not present

## 2020-08-06 DIAGNOSIS — I700 Atherosclerosis of aorta: Secondary | ICD-10-CM | POA: Diagnosis not present

## 2020-08-06 DIAGNOSIS — J9811 Atelectasis: Secondary | ICD-10-CM | POA: Diagnosis not present

## 2020-08-06 DIAGNOSIS — Y848 Other medical procedures as the cause of abnormal reaction of the patient, or of later complication, without mention of misadventure at the time of the procedure: Secondary | ICD-10-CM | POA: Diagnosis not present

## 2020-08-07 DIAGNOSIS — I69391 Dysphagia following cerebral infarction: Secondary | ICD-10-CM | POA: Diagnosis not present

## 2020-08-07 DIAGNOSIS — E119 Type 2 diabetes mellitus without complications: Secondary | ICD-10-CM | POA: Diagnosis not present

## 2020-08-07 DIAGNOSIS — Z7901 Long term (current) use of anticoagulants: Secondary | ICD-10-CM | POA: Diagnosis not present

## 2020-08-07 DIAGNOSIS — I700 Atherosclerosis of aorta: Secondary | ICD-10-CM | POA: Diagnosis not present

## 2020-08-07 DIAGNOSIS — M9689 Other intraoperative and postprocedural complications and disorders of the musculoskeletal system: Secondary | ICD-10-CM | POA: Diagnosis not present

## 2020-08-07 DIAGNOSIS — I69311 Memory deficit following cerebral infarction: Secondary | ICD-10-CM | POA: Diagnosis not present

## 2020-08-07 DIAGNOSIS — K219 Gastro-esophageal reflux disease without esophagitis: Secondary | ICD-10-CM | POA: Diagnosis not present

## 2020-08-07 DIAGNOSIS — I69398 Other sequelae of cerebral infarction: Secondary | ICD-10-CM | POA: Diagnosis not present

## 2020-08-07 DIAGNOSIS — I480 Paroxysmal atrial fibrillation: Secondary | ICD-10-CM | POA: Diagnosis not present

## 2020-08-07 DIAGNOSIS — Z9181 History of falling: Secondary | ICD-10-CM | POA: Diagnosis not present

## 2020-08-07 DIAGNOSIS — Z79899 Other long term (current) drug therapy: Secondary | ICD-10-CM | POA: Diagnosis not present

## 2020-08-07 DIAGNOSIS — I428 Other cardiomyopathies: Secondary | ICD-10-CM | POA: Diagnosis not present

## 2020-08-07 DIAGNOSIS — Y848 Other medical procedures as the cause of abnormal reaction of the patient, or of later complication, without mention of misadventure at the time of the procedure: Secondary | ICD-10-CM | POA: Diagnosis not present

## 2020-08-07 DIAGNOSIS — R531 Weakness: Secondary | ICD-10-CM | POA: Diagnosis not present

## 2020-08-07 DIAGNOSIS — J9811 Atelectasis: Secondary | ICD-10-CM | POA: Diagnosis not present

## 2020-08-07 DIAGNOSIS — I110 Hypertensive heart disease with heart failure: Secondary | ICD-10-CM | POA: Diagnosis not present

## 2020-08-07 DIAGNOSIS — E782 Mixed hyperlipidemia: Secondary | ICD-10-CM | POA: Diagnosis not present

## 2020-08-07 DIAGNOSIS — I5022 Chronic systolic (congestive) heart failure: Secondary | ICD-10-CM | POA: Diagnosis not present

## 2020-08-08 ENCOUNTER — Telehealth: Payer: Self-pay

## 2020-08-08 DIAGNOSIS — J9811 Atelectasis: Secondary | ICD-10-CM | POA: Diagnosis not present

## 2020-08-08 DIAGNOSIS — I69398 Other sequelae of cerebral infarction: Secondary | ICD-10-CM | POA: Diagnosis not present

## 2020-08-08 DIAGNOSIS — I428 Other cardiomyopathies: Secondary | ICD-10-CM | POA: Diagnosis not present

## 2020-08-08 DIAGNOSIS — E119 Type 2 diabetes mellitus without complications: Secondary | ICD-10-CM | POA: Diagnosis not present

## 2020-08-08 DIAGNOSIS — K219 Gastro-esophageal reflux disease without esophagitis: Secondary | ICD-10-CM | POA: Diagnosis not present

## 2020-08-08 DIAGNOSIS — Z9181 History of falling: Secondary | ICD-10-CM | POA: Diagnosis not present

## 2020-08-08 DIAGNOSIS — I69311 Memory deficit following cerebral infarction: Secondary | ICD-10-CM | POA: Diagnosis not present

## 2020-08-08 DIAGNOSIS — I69391 Dysphagia following cerebral infarction: Secondary | ICD-10-CM | POA: Diagnosis not present

## 2020-08-08 DIAGNOSIS — I5022 Chronic systolic (congestive) heart failure: Secondary | ICD-10-CM | POA: Diagnosis not present

## 2020-08-08 DIAGNOSIS — I700 Atherosclerosis of aorta: Secondary | ICD-10-CM | POA: Diagnosis not present

## 2020-08-08 DIAGNOSIS — E782 Mixed hyperlipidemia: Secondary | ICD-10-CM | POA: Diagnosis not present

## 2020-08-08 DIAGNOSIS — I480 Paroxysmal atrial fibrillation: Secondary | ICD-10-CM | POA: Diagnosis not present

## 2020-08-08 DIAGNOSIS — R531 Weakness: Secondary | ICD-10-CM | POA: Diagnosis not present

## 2020-08-08 DIAGNOSIS — I110 Hypertensive heart disease with heart failure: Secondary | ICD-10-CM | POA: Diagnosis not present

## 2020-08-08 DIAGNOSIS — Y848 Other medical procedures as the cause of abnormal reaction of the patient, or of later complication, without mention of misadventure at the time of the procedure: Secondary | ICD-10-CM | POA: Diagnosis not present

## 2020-08-08 DIAGNOSIS — Z7901 Long term (current) use of anticoagulants: Secondary | ICD-10-CM | POA: Diagnosis not present

## 2020-08-08 DIAGNOSIS — M9689 Other intraoperative and postprocedural complications and disorders of the musculoskeletal system: Secondary | ICD-10-CM | POA: Diagnosis not present

## 2020-08-08 DIAGNOSIS — Z79899 Other long term (current) drug therapy: Secondary | ICD-10-CM | POA: Diagnosis not present

## 2020-08-08 NOTE — Telephone Encounter (Signed)
Rosanne Ashing from [place] calling for PT verbal orders as follows:  1 time(s) weekly for 4 week(s)  Verbal orders given per [facility] protocol  Veronda Prude, RN

## 2020-08-11 NOTE — Patient Instructions (Addendum)
It was wonderful to meet you today.  Please bring ALL of your medications with you to every visit.   Today we talked about:  -Use the triamcinolone (steroid cream) to the areas of rash twice a day. -You can buy over the counter Itch-X to help relieve the itchiness -Rub and do not scratch the areas -Return if no improvement or if worsening.    Thank you for choosing [facility].   Please call [phone] with any questions about today's appointment.  Sabino Dick, DO PGY-2 Family Medicine

## 2020-08-11 NOTE — Progress Notes (Signed)
    SUBJECTIVE:   CHIEF COMPLAINT / HPI:   Rash Patient presents today to discuss rash present since 7/16. She states that this started while she was at [facility].  States that it feels hot and itchy.  The rash started on her left arm states it is now "everywhere ".  She has used aloe vera, triple antibiotics, athlete's foot cream, anti-itch cream and alcohol for this without improvement.  He has never had anything like this in the past.  Denies any new soaps, detergents or new medications.  PERTINENT  PMH / PSH:  Past Medical History:  Diagnosis Date   Abdominal pain    Acute respiratory failure with hypoxia (HCC)    Atrial flutter (HCC)    Atrial tachycardia (HCC) 07/29/2019   Back pain    Chronic anticoagulation - on Eliquis for chronic afib 02/04/2020   Chronic combined systolic and diastolic CHF (congestive heart failure) (HCC) 02/04/2020   Constipation    COVID-19 02/04/2020   Diabetes mellitus without complication (HCC)    Dysphagia 02/03/2020   Epigastric pain 02/03/2020   Fatigue    Full dentures    Hemorrhoids    HFrEF (heart failure with reduced ejection fraction) (HCC) 03/18/2020   History of esophageal stricture 07/29/2019   History of noncompliance with medical treatment, presenting hazards to health    Hypertension    Lack of adequate sleep    Lack of adequate sleep    Liver mass    Nonrheumatic tricuspid valve regurgitation 03/18/2020   Oral thrush 02/04/2020   PAF (paroxysmal atrial fibrillation) (HCC)    Pericardial effusion 05/12/2010   Pleural effusion 06/07/2020   Pleural effusion on left    Rash    on right breast   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 Neck with dry and patchy erythematous areas- there are scant papules present. No vesicles. No other rashes present. Neuro: alert, no obvious focal deficits Psych: Normal affect and mood       ASSESSMENT/PLAN:   Contact dermatitis Present for just over 2 weeks.  Has had no improvement with her over-the-counter remedies.  Appears to be contact dermatitis.  She was concerned this could be medication related, however patient is not on any new medications.  Upon medication review, I do not see any potential culprits. -Triamcinolone 0.1% twice daily -Itch-x OTC -Advised to not scratch area -Return in 1 week if not improving or worsening     , DO [facility]

## 2020-08-12 ENCOUNTER — Other Ambulatory Visit: Payer: Self-pay

## 2020-08-12 ENCOUNTER — Encounter: Payer: Self-pay | Admitting: Family Medicine

## 2020-08-12 ENCOUNTER — Ambulatory Visit (INDEPENDENT_AMBULATORY_CARE_PROVIDER_SITE_OTHER): Payer: Medicare Other | Admitting: Family Medicine

## 2020-08-12 VITALS — BP 142/63 | HR 81 | Ht 63.0 in | Wt 166.6 lb

## 2020-08-12 DIAGNOSIS — K219 Gastro-esophageal reflux disease without esophagitis: Secondary | ICD-10-CM | POA: Diagnosis not present

## 2020-08-12 DIAGNOSIS — I428 Other cardiomyopathies: Secondary | ICD-10-CM | POA: Diagnosis not present

## 2020-08-12 DIAGNOSIS — Z9181 History of falling: Secondary | ICD-10-CM | POA: Diagnosis not present

## 2020-08-12 DIAGNOSIS — L259 Unspecified contact dermatitis, unspecified cause: Secondary | ICD-10-CM | POA: Diagnosis not present

## 2020-08-12 DIAGNOSIS — J9811 Atelectasis: Secondary | ICD-10-CM | POA: Diagnosis not present

## 2020-08-12 DIAGNOSIS — E782 Mixed hyperlipidemia: Secondary | ICD-10-CM | POA: Diagnosis not present

## 2020-08-12 DIAGNOSIS — E119 Type 2 diabetes mellitus without complications: Secondary | ICD-10-CM | POA: Diagnosis not present

## 2020-08-12 DIAGNOSIS — I5022 Chronic systolic (congestive) heart failure: Secondary | ICD-10-CM | POA: Diagnosis not present

## 2020-08-12 DIAGNOSIS — I69391 Dysphagia following cerebral infarction: Secondary | ICD-10-CM | POA: Diagnosis not present

## 2020-08-12 DIAGNOSIS — I110 Hypertensive heart disease with heart failure: Secondary | ICD-10-CM | POA: Diagnosis not present

## 2020-08-12 DIAGNOSIS — L239 Allergic contact dermatitis, unspecified cause: Secondary | ICD-10-CM

## 2020-08-12 DIAGNOSIS — Z7901 Long term (current) use of anticoagulants: Secondary | ICD-10-CM | POA: Diagnosis not present

## 2020-08-12 DIAGNOSIS — Y848 Other medical procedures as the cause of abnormal reaction of the patient, or of later complication, without mention of misadventure at the time of the procedure: Secondary | ICD-10-CM | POA: Diagnosis not present

## 2020-08-12 DIAGNOSIS — I700 Atherosclerosis of aorta: Secondary | ICD-10-CM | POA: Diagnosis not present

## 2020-08-12 DIAGNOSIS — I480 Paroxysmal atrial fibrillation: Secondary | ICD-10-CM | POA: Diagnosis not present

## 2020-08-12 DIAGNOSIS — I69311 Memory deficit following cerebral infarction: Secondary | ICD-10-CM | POA: Diagnosis not present

## 2020-08-12 DIAGNOSIS — I69398 Other sequelae of cerebral infarction: Secondary | ICD-10-CM | POA: Diagnosis not present

## 2020-08-12 DIAGNOSIS — Z79899 Other long term (current) drug therapy: Secondary | ICD-10-CM | POA: Diagnosis not present

## 2020-08-12 DIAGNOSIS — R531 Weakness: Secondary | ICD-10-CM | POA: Diagnosis not present

## 2020-08-12 DIAGNOSIS — M9689 Other intraoperative and postprocedural complications and disorders of the musculoskeletal system: Secondary | ICD-10-CM | POA: Diagnosis not present

## 2020-08-12 HISTORY — DX: Unspecified contact dermatitis, unspecified cause: L25.9

## 2020-08-12 MED ORDER — TRIAMCINOLONE ACETONIDE 0.1 % EX CREA: 1.0000 "application " | TOPICAL_CREAM | Freq: Two times a day (BID) | CUTANEOUS | 0 refills | Status: DC

## 2020-08-12 NOTE — Assessment & Plan Note (Addendum)
Present for just over 2 weeks.  Has had no improvement with her over-the-counter remedies.  Appears to be contact dermatitis.  She was concerned this could be medication related, however patient is not on any new medications.  Upon medication review, I do not see any potential culprits. -Triamcinolone 0.1% twice daily -Itch-x OTC -Advised to not scratch area -Return in 1 week if not improving or worsening

## 2020-08-13 DIAGNOSIS — M9689 Other intraoperative and postprocedural complications and disorders of the musculoskeletal system: Secondary | ICD-10-CM | POA: Diagnosis not present

## 2020-08-13 DIAGNOSIS — K219 Gastro-esophageal reflux disease without esophagitis: Secondary | ICD-10-CM | POA: Diagnosis not present

## 2020-08-13 DIAGNOSIS — Z7901 Long term (current) use of anticoagulants: Secondary | ICD-10-CM | POA: Diagnosis not present

## 2020-08-13 DIAGNOSIS — I480 Paroxysmal atrial fibrillation: Secondary | ICD-10-CM | POA: Diagnosis not present

## 2020-08-13 DIAGNOSIS — I428 Other cardiomyopathies: Secondary | ICD-10-CM | POA: Diagnosis not present

## 2020-08-13 DIAGNOSIS — E119 Type 2 diabetes mellitus without complications: Secondary | ICD-10-CM | POA: Diagnosis not present

## 2020-08-13 DIAGNOSIS — I700 Atherosclerosis of aorta: Secondary | ICD-10-CM | POA: Diagnosis not present

## 2020-08-13 DIAGNOSIS — I110 Hypertensive heart disease with heart failure: Secondary | ICD-10-CM | POA: Diagnosis not present

## 2020-08-13 DIAGNOSIS — R531 Weakness: Secondary | ICD-10-CM | POA: Diagnosis not present

## 2020-08-13 DIAGNOSIS — Y848 Other medical procedures as the cause of abnormal reaction of the patient, or of later complication, without mention of misadventure at the time of the procedure: Secondary | ICD-10-CM | POA: Diagnosis not present

## 2020-08-13 DIAGNOSIS — I69391 Dysphagia following cerebral infarction: Secondary | ICD-10-CM | POA: Diagnosis not present

## 2020-08-13 DIAGNOSIS — E782 Mixed hyperlipidemia: Secondary | ICD-10-CM | POA: Diagnosis not present

## 2020-08-13 DIAGNOSIS — I5022 Chronic systolic (congestive) heart failure: Secondary | ICD-10-CM | POA: Diagnosis not present

## 2020-08-13 DIAGNOSIS — I69311 Memory deficit following cerebral infarction: Secondary | ICD-10-CM | POA: Diagnosis not present

## 2020-08-13 DIAGNOSIS — Z79899 Other long term (current) drug therapy: Secondary | ICD-10-CM | POA: Diagnosis not present

## 2020-08-13 DIAGNOSIS — Z9181 History of falling: Secondary | ICD-10-CM | POA: Diagnosis not present

## 2020-08-13 DIAGNOSIS — J9811 Atelectasis: Secondary | ICD-10-CM | POA: Diagnosis not present

## 2020-08-13 DIAGNOSIS — I69398 Other sequelae of cerebral infarction: Secondary | ICD-10-CM | POA: Diagnosis not present

## 2020-08-15 ENCOUNTER — Other Ambulatory Visit: Payer: Self-pay

## 2020-08-15 DIAGNOSIS — Z9181 History of falling: Secondary | ICD-10-CM | POA: Diagnosis not present

## 2020-08-15 DIAGNOSIS — I69398 Other sequelae of cerebral infarction: Secondary | ICD-10-CM | POA: Diagnosis not present

## 2020-08-15 DIAGNOSIS — I480 Paroxysmal atrial fibrillation: Secondary | ICD-10-CM | POA: Diagnosis not present

## 2020-08-15 DIAGNOSIS — R531 Weakness: Secondary | ICD-10-CM | POA: Diagnosis not present

## 2020-08-15 DIAGNOSIS — Y848 Other medical procedures as the cause of abnormal reaction of the patient, or of later complication, without mention of misadventure at the time of the procedure: Secondary | ICD-10-CM | POA: Diagnosis not present

## 2020-08-15 DIAGNOSIS — I428 Other cardiomyopathies: Secondary | ICD-10-CM | POA: Diagnosis not present

## 2020-08-15 DIAGNOSIS — I69311 Memory deficit following cerebral infarction: Secondary | ICD-10-CM | POA: Diagnosis not present

## 2020-08-15 DIAGNOSIS — Z7901 Long term (current) use of anticoagulants: Secondary | ICD-10-CM | POA: Diagnosis not present

## 2020-08-15 DIAGNOSIS — I110 Hypertensive heart disease with heart failure: Secondary | ICD-10-CM | POA: Diagnosis not present

## 2020-08-15 DIAGNOSIS — K219 Gastro-esophageal reflux disease without esophagitis: Secondary | ICD-10-CM | POA: Diagnosis not present

## 2020-08-15 DIAGNOSIS — J9811 Atelectasis: Secondary | ICD-10-CM | POA: Diagnosis not present

## 2020-08-15 DIAGNOSIS — I700 Atherosclerosis of aorta: Secondary | ICD-10-CM | POA: Diagnosis not present

## 2020-08-15 DIAGNOSIS — I69391 Dysphagia following cerebral infarction: Secondary | ICD-10-CM | POA: Diagnosis not present

## 2020-08-15 DIAGNOSIS — E119 Type 2 diabetes mellitus without complications: Secondary | ICD-10-CM | POA: Diagnosis not present

## 2020-08-15 DIAGNOSIS — I5022 Chronic systolic (congestive) heart failure: Secondary | ICD-10-CM | POA: Diagnosis not present

## 2020-08-15 DIAGNOSIS — Z79899 Other long term (current) drug therapy: Secondary | ICD-10-CM | POA: Diagnosis not present

## 2020-08-15 DIAGNOSIS — M9689 Other intraoperative and postprocedural complications and disorders of the musculoskeletal system: Secondary | ICD-10-CM | POA: Diagnosis not present

## 2020-08-15 DIAGNOSIS — E782 Mixed hyperlipidemia: Secondary | ICD-10-CM | POA: Diagnosis not present

## 2020-08-18 MED ORDER — ISOSORB DINITRATE-HYDRALAZINE 20-37.5 MG PO TABS: 2.0000 | ORAL_TABLET | Freq: Three times a day (TID) | ORAL | 0 refills | Status: DC

## 2020-08-20 DIAGNOSIS — I69391 Dysphagia following cerebral infarction: Secondary | ICD-10-CM | POA: Diagnosis not present

## 2020-08-20 DIAGNOSIS — I69311 Memory deficit following cerebral infarction: Secondary | ICD-10-CM | POA: Diagnosis not present

## 2020-08-20 DIAGNOSIS — E782 Mixed hyperlipidemia: Secondary | ICD-10-CM | POA: Diagnosis not present

## 2020-08-20 DIAGNOSIS — Z7901 Long term (current) use of anticoagulants: Secondary | ICD-10-CM | POA: Diagnosis not present

## 2020-08-20 DIAGNOSIS — Z9181 History of falling: Secondary | ICD-10-CM | POA: Diagnosis not present

## 2020-08-20 DIAGNOSIS — I428 Other cardiomyopathies: Secondary | ICD-10-CM | POA: Diagnosis not present

## 2020-08-20 DIAGNOSIS — I480 Paroxysmal atrial fibrillation: Secondary | ICD-10-CM | POA: Diagnosis not present

## 2020-08-20 DIAGNOSIS — I5022 Chronic systolic (congestive) heart failure: Secondary | ICD-10-CM | POA: Diagnosis not present

## 2020-08-20 DIAGNOSIS — E119 Type 2 diabetes mellitus without complications: Secondary | ICD-10-CM | POA: Diagnosis not present

## 2020-08-20 DIAGNOSIS — Z79899 Other long term (current) drug therapy: Secondary | ICD-10-CM | POA: Diagnosis not present

## 2020-08-20 DIAGNOSIS — M9689 Other intraoperative and postprocedural complications and disorders of the musculoskeletal system: Secondary | ICD-10-CM | POA: Diagnosis not present

## 2020-08-20 DIAGNOSIS — R531 Weakness: Secondary | ICD-10-CM | POA: Diagnosis not present

## 2020-08-20 DIAGNOSIS — I700 Atherosclerosis of aorta: Secondary | ICD-10-CM | POA: Diagnosis not present

## 2020-08-20 DIAGNOSIS — J9811 Atelectasis: Secondary | ICD-10-CM | POA: Diagnosis not present

## 2020-08-20 DIAGNOSIS — I110 Hypertensive heart disease with heart failure: Secondary | ICD-10-CM | POA: Diagnosis not present

## 2020-08-20 DIAGNOSIS — I69398 Other sequelae of cerebral infarction: Secondary | ICD-10-CM | POA: Diagnosis not present

## 2020-08-20 DIAGNOSIS — K219 Gastro-esophageal reflux disease without esophagitis: Secondary | ICD-10-CM | POA: Diagnosis not present

## 2020-08-20 DIAGNOSIS — Y848 Other medical procedures as the cause of abnormal reaction of the patient, or of later complication, without mention of misadventure at the time of the procedure: Secondary | ICD-10-CM | POA: Diagnosis not present

## 2020-08-25 ENCOUNTER — Other Ambulatory Visit: Payer: Self-pay

## 2020-08-25 NOTE — Patient Outreach (Signed)
[facility]) Care Management  08/25/2020  TEMPIE GIBEAULT 07-Jun-1950 [id number]   First telephone outreach attempt to obtain mRS. No answer. Left message for returned call.  Vanice Sarah [facility] Management Assistant [phone]

## 2020-08-26 DIAGNOSIS — I69311 Memory deficit following cerebral infarction: Secondary | ICD-10-CM | POA: Diagnosis not present

## 2020-08-26 DIAGNOSIS — Y848 Other medical procedures as the cause of abnormal reaction of the patient, or of later complication, without mention of misadventure at the time of the procedure: Secondary | ICD-10-CM | POA: Diagnosis not present

## 2020-08-26 DIAGNOSIS — I5022 Chronic systolic (congestive) heart failure: Secondary | ICD-10-CM | POA: Diagnosis not present

## 2020-08-26 DIAGNOSIS — M9689 Other intraoperative and postprocedural complications and disorders of the musculoskeletal system: Secondary | ICD-10-CM | POA: Diagnosis not present

## 2020-08-26 DIAGNOSIS — I69391 Dysphagia following cerebral infarction: Secondary | ICD-10-CM | POA: Diagnosis not present

## 2020-08-26 DIAGNOSIS — J9811 Atelectasis: Secondary | ICD-10-CM | POA: Diagnosis not present

## 2020-08-26 DIAGNOSIS — Z9181 History of falling: Secondary | ICD-10-CM | POA: Diagnosis not present

## 2020-08-26 DIAGNOSIS — Z79899 Other long term (current) drug therapy: Secondary | ICD-10-CM | POA: Diagnosis not present

## 2020-08-26 DIAGNOSIS — E119 Type 2 diabetes mellitus without complications: Secondary | ICD-10-CM | POA: Diagnosis not present

## 2020-08-26 DIAGNOSIS — I69398 Other sequelae of cerebral infarction: Secondary | ICD-10-CM | POA: Diagnosis not present

## 2020-08-26 DIAGNOSIS — I428 Other cardiomyopathies: Secondary | ICD-10-CM | POA: Diagnosis not present

## 2020-08-26 DIAGNOSIS — R531 Weakness: Secondary | ICD-10-CM | POA: Diagnosis not present

## 2020-08-26 DIAGNOSIS — I110 Hypertensive heart disease with heart failure: Secondary | ICD-10-CM | POA: Diagnosis not present

## 2020-08-26 DIAGNOSIS — I700 Atherosclerosis of aorta: Secondary | ICD-10-CM | POA: Diagnosis not present

## 2020-08-26 DIAGNOSIS — Z7901 Long term (current) use of anticoagulants: Secondary | ICD-10-CM | POA: Diagnosis not present

## 2020-08-26 DIAGNOSIS — K219 Gastro-esophageal reflux disease without esophagitis: Secondary | ICD-10-CM | POA: Diagnosis not present

## 2020-08-26 DIAGNOSIS — I480 Paroxysmal atrial fibrillation: Secondary | ICD-10-CM | POA: Diagnosis not present

## 2020-08-26 DIAGNOSIS — E782 Mixed hyperlipidemia: Secondary | ICD-10-CM | POA: Diagnosis not present

## 2020-08-28 ENCOUNTER — Ambulatory Visit: Payer: Medicare Other | Admitting: Cardiology

## 2020-08-28 ENCOUNTER — Other Ambulatory Visit: Payer: Self-pay

## 2020-08-28 ENCOUNTER — Encounter: Payer: Self-pay | Admitting: Cardiology

## 2020-08-28 VITALS — BP 146/64 | HR 78 | Ht 63.0 in | Wt 167.0 lb

## 2020-08-28 DIAGNOSIS — I10 Essential (primary) hypertension: Secondary | ICD-10-CM | POA: Diagnosis not present

## 2020-08-28 DIAGNOSIS — Z794 Long term (current) use of insulin: Secondary | ICD-10-CM | POA: Diagnosis not present

## 2020-08-28 DIAGNOSIS — I480 Paroxysmal atrial fibrillation: Secondary | ICD-10-CM | POA: Diagnosis not present

## 2020-08-28 DIAGNOSIS — E1159 Type 2 diabetes mellitus with other circulatory complications: Secondary | ICD-10-CM | POA: Diagnosis not present

## 2020-08-28 DIAGNOSIS — Z9889 Other specified postprocedural states: Secondary | ICD-10-CM

## 2020-08-28 DIAGNOSIS — E782 Mixed hyperlipidemia: Secondary | ICD-10-CM | POA: Diagnosis not present

## 2020-08-28 DIAGNOSIS — Z8673 Personal history of transient ischemic attack (TIA), and cerebral infarction without residual deficits: Secondary | ICD-10-CM | POA: Diagnosis not present

## 2020-08-28 DIAGNOSIS — I5022 Chronic systolic (congestive) heart failure: Secondary | ICD-10-CM | POA: Diagnosis not present

## 2020-08-28 DIAGNOSIS — Z7901 Long term (current) use of anticoagulants: Secondary | ICD-10-CM

## 2020-08-28 NOTE — Progress Notes (Signed)
Date:  08/28/2020   ID:  [name] [name], DOB [birth year], MRN [id number]  PCP:  Dana Allan, MD  Cardiologist:  Tessa Lerner, DO, [facility] (established care 08/28/2020) Former Cardiology Providers: Dr. Rollene Rotunda, Dr. Thomasene Ripple Advanced heart failure specialist: Dr. Gala Romney and Dr. Shirlee Latch Cardiac electrophysiologist: Dr. Graciela Husbands  REASON FOR CONSULT: Chronic combined systolic and diastolic heart failure  REQUESTING PHYSICIAN:  Dana Allan, MD [address] [place] [address]  Chief Complaint  Patient presents with   Atrial Fibrillation   Cardiomyopathy   Congestive Heart Failure    HPI  [name] [name] is a 70 y.o. female who presents to the office with a chief complaint of " establish care given the comorbidities of congestive heart failure, cardiomyopathy, A. fib." Patient's past medical history and cardiovascular risk factors include: Cardiomyopathy, chronic HFrEF (EF <20%), paroxysmal atrial fibrillation s/p TEE/cardioversion on oral anticoagulation, large pericardial effusion, hypertension, hyperlipidemia, insulin-dependent diabetes mellitus type 2, history of embolic stroke requiring tPA and mechanical thrombectomy, history of cardiogenic shock/PEA cardiac arrest immediately following thrombectomy, large left multi loculated extensive pleural effusion, advanced age, postmenopausal female.  She is referred to the office at the request of [name] Meyer Estevan Ryder, MD for evaluation of establish care given her chronic systolic and diastolic heart failure.  Patient is accompanied by her daughter [name] [name] at today's office visit.  Patient provides verbal consent in regards to having her present and discussing her medical information in the presence of her daughter.  Patient states that recently she has been under the care of of [facility] heart care and is here to reestablish care with a different cardiologist in the community given her complex cardiac conditions.  Clinically she  is not complaining of chest pain or angina pectoris.  Appears to be euvolemic and denies any shortness of breath at rest or with effort related activities.  No recent hospitalizations or urgent care visits for cardiovascular symptoms.  However, recently patient states that earlier this year she fell and was unable to get up when in the [facility], ED and was found to have an acute stroke which required tPA and mechanical thrombectomy.  Post procedure she suffered from cardiogenic shock and PEA arrest and also developed atrial fibrillation for which she had to be cardioverted.  Echocardiogram noted severely reduced LVEF and patient was being managed for congestive heart failure.  She was last seen by Dr. Rollene Rotunda in June 2022 during hospitalization.  Based on his progress note patient has failed to follow-up with regards to ischemic/infiltrative cardiomyopathy work-up in the past.  Digoxin was discontinued due to borderline elevated therapeutic levels, spironolactone discontinued secondary to hyperkalemia, amlodipine discontinued due to reduced LVEF, not currently on ARB/Arni due to prior history of hyperkalemia.  SGLT2i have not been added to her medical regimen to the best of my knowledge and medical review.   FUNCTIONAL STATUS: No structured exercise program or daily routine.   ALLERGIES: Allergies  Allergen Reactions   Amiodarone Shortness Of Breath and Other (See Comments)    Soreness, wheezing, and weakness also Update:  tolerating Amio PO 06/2020   Fruit & Vegetable Daily [Nutritional Supplements] Shortness Of Breath, Swelling and Other (See Comments)    All fruits cause tongue swelling and numbness Organic fruits are okay to eat   Metoprolol Shortness Of Breath, Other (See Comments) and Cough    Soreness, wheezing, and weakness also   Other Shortness Of Breath, Swelling and Other (See Comments)    Tree  nuts cause tongue swelling and numbness   Peanut-Containing Drug Products  Shortness Of Breath, Swelling and Other (See Comments)    Tongue swelling and numbness   Aspirin Nausea And Vomiting and Other (See Comments)    Cannot take due to liver issues   Tylenol [Acetaminophen] Other (See Comments)    Cannot take due to liver issues- tolerating 650 mg tablets in 2022   Lactose Intolerance (Gi) Diarrhea and Nausea And Vomiting    MEDICATION LIST PRIOR TO VISIT: Current Meds  Medication Sig   acetaminophen (TYLENOL) 325 MG tablet Take 750 mg by mouth 2 (two) times daily.   amiodarone (PACERONE) 200 MG tablet Take 1 tablet (200 mg total) by mouth daily.   amLODipine (NORVASC) 5 MG tablet TAKE 1 TABLET(5 MG) BY MOUTH DAILY   apixaban (ELIQUIS) 5 MG TABS tablet TAKE 1 TABLET(5 MG) BY MOUTH TWICE DAILY   atorvastatin (LIPITOR) 80 MG tablet Take 1 tablet (80 mg total) by mouth daily.   B-D UF III MINI PEN NEEDLES 31G X 5 MM MISC USE WITH LANTUS PEN DAILY AS DIRECTED   bisacodyl (DULCOLAX) 10 MG suppository Place 1 suppository (10 mg total) rectally daily as needed for moderate constipation.   Blood Glucose Monitoring Suppl (ONETOUCH VERIO) w/Device KIT Please use to check blood sugar up to three times daily. E11.65   Blood Pressure KIT Monitor blood pressure twice a day   digoxin (LANOXIN) 0.125 MG tablet Take 1 tablet (0.125 mg total) by mouth daily.   ferrous gluconate (FERGON) 324 MG tablet Take 1 tablet (324 mg total) by mouth daily.   glucose blood (ONETOUCH VERIO) test strip Please use to check blood sugar up to three times daily. E11.65   isosorbide-hydrALAZINE (BIDIL) 20-37.5 MG tablet Take 2 tablets by mouth 3 (three) times daily.   Lancet Devices (ONE TOUCH DELICA LANCING DEV) MISC Please use to check blood sugar up to three times daily. E11.65   LANTUS SOLOSTAR 100 UNIT/ML Solostar Pen ADMINISTER 18 UNITS UNDER THE SKIN DAILY   lidocaine (LIDODERM) 5 % Place 1 patch onto the skin daily. Remove & Discard patch within 12 hours or as directed by MD   Lidocaine 4  % PTCH Apply 1 patch topically 2 (two) times daily. Apply to upper back   Multiple Vitamin (MULTIVITAMIN WITH MINERALS) TABS tablet Take 1 tablet by mouth daily.   OneTouch Delica Lancets 33G MISC Please use to check blood sugar up to three times daily. E11.65   pantoprazole (PROTONIX) 40 MG tablet Take 1 tablet (40 mg total) by mouth daily.   polyethylene glycol (MIRALAX / GLYCOLAX) 17 g packet Take 17 g by mouth 2 (two) times daily.   Semaglutide (OZEMPIC, 0.25 OR 0.5 MG/DOSE, [place]) Inject into the skin.   senna-docusate (SENOKOT-S) 8.6-50 MG tablet Take 2 tablets by mouth 2 (two) times daily.   triamcinolone cream (KENALOG) 0.1 % Apply 1 application topically 2 (two) times daily.     PAST MEDICAL HISTORY: Past Medical History:  Diagnosis Date   Abdominal pain    Acute respiratory failure with hypoxia (HCC)    Atrial flutter (HCC)    Atrial tachycardia (HCC) 07/29/2019   Back pain    Chronic anticoagulation - on Eliquis for chronic afib 02/04/2020   Chronic combined systolic and diastolic CHF (congestive heart failure) (HCC) 02/04/2020   Constipation    COVID-19 02/04/2020   Diabetes mellitus without complication (HCC)    Dysphagia 02/03/2020   Epigastric pain 02/03/2020  Fatigue    Full dentures    Hemorrhoids    HFrEF (heart failure with reduced ejection fraction) ([place]) 03/18/2020   History of esophageal stricture 07/29/2019   History of noncompliance with medical treatment, presenting hazards to health    Hyperlipidemia    Hypertension    Lack of adequate sleep    Lack of adequate sleep    Liver mass    Nonrheumatic tricuspid valve regurgitation 03/18/2020   Oral thrush 02/04/2020   PAF (paroxysmal atrial fibrillation) (HCC)    Pericardial effusion 05/12/2010   Pleural effusion 06/07/2020   Pleural effusion on left    Rash    on right breast   Stroke (cerebrum) ([place]) 05/12/2020   Uncontrolled type 2 diabetes mellitus with hyperglycemia ([place]) 06/08/2017   Wears  glasses     PAST SURGICAL HISTORY: Past Surgical History:  Procedure Laterality Date   BREAST DUCTAL SYSTEM EXCISION     BREAST EXCISIONAL BIOPSY Left    CARDIOVERSION N/A 07/31/2019   Procedure: CARDIOVERSION;  Surgeon: Pixie Casino, MD;  Location: [place];  Service: Cardiovascular;  Laterality: N/A;   CARDIOVERSION N/A 05/19/2020   Procedure: CARDIOVERSION;  Surgeon: Jolaine Artist, MD;  Location: [place];  Service: Cardiovascular;  Laterality: N/A;   CHOLECYSTECTOMY     CHOLECYSTECTOMY, LAPAROSCOPIC  07/24/2010   ESOPHAGEAL DILATION     IR CT HEAD LTD  05/12/2020   IR PERCUTANEOUS ART THROMBECTOMY/INFUSION INTRACRANIAL INC DIAG ANGIO  05/12/2020       IR PERCUTANEOUS ART THROMBECTOMY/INFUSION INTRACRANIAL INC DIAG ANGIO  05/12/2020   IR US GUIDE VASC ACCESS RIGHT  05/12/2020   RADIOLOGY WITH ANESTHESIA N/A 05/12/2020   Procedure: IR WITH ANESTHESIA;  Surgeon: Radiologist, Medication, MD;  Location: [place];  Service: Radiology;  Laterality: N/A;   TEE WITHOUT CARDIOVERSION N/A 07/31/2019   Procedure: TRANSESOPHAGEAL ECHOCARDIOGRAM (TEE);  Surgeon: Pixie Casino, MD;  Location: [facility] ENDOSCOPY;  Service: Cardiovascular;  Laterality: N/A;   TEE WITHOUT CARDIOVERSION N/A 05/19/2020   Procedure: TRANSESOPHAGEAL ECHOCARDIOGRAM (TEE);  Surgeon: Jolaine Artist, MD;  [facility]COPY;  Service: Cardiovascular;  Laterality: N/A;    FAMILY HISTORY: The patient family history includes Alcohol abuse in an other family member; Diabetes in her sister, sister and another family member; Hypertension in her sister; Multiple sclerosis in her sister; Other in her father; Stroke in her mother.  SOCIAL HISTORY:  The patient  reports that she has never smoked. She has never used smokeless tobacco. She reports that she does not drink alcohol and does not use drugs.  REVIEW OF SYSTEMS: Review of Systems  Constitutional: Positive for malaise/fatigue. Negative for chills and fever.  HENT:   Negative for hoarse voice and nosebleeds.   Eyes:  Negative for discharge, double vision and pain.  Cardiovascular:  Negative for chest pain, claudication, dyspnea on exertion, leg swelling, near-syncope, orthopnea, palpitations, paroxysmal nocturnal dyspnea and syncope.  Respiratory:  Negative for hemoptysis and shortness of breath.   Musculoskeletal:  Negative for muscle cramps and myalgias.  Gastrointestinal:  Negative for abdominal pain, constipation, diarrhea, hematemesis, hematochezia, melena, nausea and vomiting.  Neurological:  Negative for dizziness and light-headedness.   PHYSICAL EXAM: Vitals with BMI 08/28/2020 08/12/2020 07/23/2020  Height $Remov'5\' 3"'KYnzOt$  $Remove'5\' 3"'ROHAUlS$  $RemoveB'5\' 3"'pEvDVnDr$   Weight 167 lbs 166 lbs 10 oz 168 lbs 13 oz  BMI 29.59 16.83 72.90  Systolic 211 155 208  Diastolic 64 63 59  Pulse 78 81 88    CONSTITUTIONAL: Well-developed and well-nourished. No  acute distress.  SKIN: Skin is warm and dry. No rash noted. No cyanosis. No pallor. No jaundice HEAD: Normocephalic and atraumatic.  EYES: No scleral icterus MOUTH/THROAT: Moist oral membranes.  NECK: No JVD present. No thyromegaly noted. No carotid bruits  LYMPHATIC: No visible cervical adenopathy.  CHEST Normal respiratory effort. No intercostal retractions  LUNGS: Clear to auscultation bilaterally.  No stridor. No wheezes. No rales.  CARDIOVASCULAR: Regular, positive U5-K2, soft holosystolic murmur heard at the left lower sternal border, no gallops or rubs. ABDOMINAL: Soft, nontender, nondistended, positive bowel sounds in all 4 quadrants, no apparent ascites.  EXTREMITIES: No peripheral edema, warm to touch bilaterally, 2+ bilateral posterior tibial pulses. HEMATOLOGIC: No significant bruising NEUROLOGIC: Oriented to person, place, and time. Nonfocal. Normal muscle tone.  PSYCHIATRIC: Normal mood and affect. Normal behavior. Cooperative  CARDIAC DATABASE: EKG: 08/28/2020: Normal sinus rhythm, 74 bpm, diffuse ST-T changes cannot rule out  underlying ischemia.  Similar findings on prior EKG dated 06/07/2020.  Echocardiogram: 05/12/20:   1. Left ventricular ejection fraction, by estimation, is <20%. The left  ventricle has severely decreased function. The left ventricle demonstrates  global hypokinesis. There is severe concentric left ventricular  hypertrophy. Diastolic function is indeterminant due to atrial fibrillation. No LV thrombus visualized,  however, apex incompletely visualized due to lack of definity contrast.   2. Right ventricular systolic function is severely reduced. The right  ventricular size is mildly enlarged. There is mildly elevated pulmonary  artery systolic pressure. The estimated right ventricular systolic  pressure is 70.6 mmHg.   3. A large, circumferential pericardial effusion is present. The effusion  appears to be greatest around the posterolateral LV measuring up to 2.2cm  at end-diastole. There is no RV or RA collapse to suggest tamponade. IVC  is dilated and not collapsible but patient is on the vent with significant TR, which are likely the  primary drivers of IVC dilation.   4. The mitral valve is normal in structure. Mild mitral valve regurgitation.   5. Tricuspid valve regurgitation is moderate to severe.   6. The aortic valve is tricuspid. There is mild calcification of the  aortic valve. There is mild thickening of the aortic valve. Aortic valve  regurgitation is trivial. Mild aortic valve sclerosis is present, with no  evidence of aortic valve stenosis.   7. Left atrial size was mildly dilated.    LABORATORY DATA: CBC Latest Ref Rng & Units 07/23/2020 06/20/2020 06/19/2020  WBC 3.4 - 10.8 x10E3/uL 6.8 - 7.1  Hemoglobin 11.1 - 15.9 g/dL 13.0 9.9(L) 9.9(L)  Hematocrit 34.0 - 46.6 % 43.7 32.9(L) 32.4(L)  Platelets 150 - 450 x10E3/uL 291 - 491(H)    CMP Latest Ref Rng & Units 07/23/2020 06/20/2020 06/19/2020  Glucose 65 - 99 mg/dL 214(H) 105(H) 90  BUN 8 - 27 mg/dL 13 8 7(L)  Creatinine 0.57  - 1.00 mg/dL 0.78 0.80 0.64  Sodium 134 - 144 mmol/L 137 134(L) 134(L)  Potassium 3.5 - 5.2 mmol/L 4.3 4.8 4.4  Chloride 96 - 106 mmol/L 97 100 102  CO2 20 - 29 mmol/L 18(L) 27 26  Calcium 8.7 - 10.3 mg/dL 9.8 8.6(L) 8.5(L)  Total Protein 6.0 - 8.5 g/dL 7.8 - -  Total Bilirubin 0.0 - 1.2 mg/dL <0.2 - -  Alkaline Phos 44 - 121 IU/L 89 - -  AST 0 - 40 IU/L 37 - -  ALT 0 - 32 IU/L 25 - -    Lipid Panel  Lab Results  Component Value  Date   CHOL 87 05/13/2020   HDL 17 (L) 05/13/2020   LDLCALC 61 05/13/2020   TRIG 46 05/13/2020   CHOLHDL 5.1 05/13/2020     No components found for: NTPROBNP No results for input(s): PROBNP in the last 8760 hours. Recent Labs    02/04/20 0657 05/13/20 0527  TSH 2.436 1.420    BMP Recent Labs    11/09/19 1201 02/03/20 1445 03/03/20 1559 03/05/20 1524 03/18/20 1645 06/16/20 0504 06/19/20 0613 06/20/20 0510 07/23/20 1556  NA 145*   < > 138 139   < > 135 134* 134* 137  K 4.5   < > 5.1 4.8   < > 4.3 4.4 4.8 4.3  CL 105   < > 104 102   < > 102 102 100 97  CO2 27   < > 21 24   < > $R'29 26 27 'LE$ 18*  GLUCOSE 139*   < > 161* 197*   < > 92 90 105* 214*  BUN 13   < > 16 17   < > 11 7* 8 13  CREATININE 0.80   < > 0.96 1.08*   < > 0.46 0.64 0.80 0.78  CALCIUM 9.6   < > 9.1 8.9   < > 8.1* 8.5* 8.6* 9.8  GFRNONAA 77   < > 61 53*   < > >60 >60 >60  --   GFRAA 88  --  70 61  --   --   --   --   --    < > = values in this interval not displayed.    HEMOGLOBIN A1C Lab Results  Component Value Date   HGBA1C 7.6 (A) 07/23/2020   MPG 254.65 05/13/2020    IMPRESSION:    ICD-10-CM   1. Chronic HFrEF (heart failure with reduced ejection fraction) (HCC)  I50.22 ECHOCARDIOGRAM COMPLETE    B Nat Peptide    Basic metabolic panel    2. Paroxysmal A-fib (HCC)  I48.0 EKG 12-Lead    TSH    3. History of cardioversion  Z98.890     4. Long term (current) use of anticoagulants  Z79.01     5. Benign hypertension  I10     6. Mixed hyperlipidemia  E78.2      7. Type 2 diabetes mellitus with other circulatory complication, with long-term current use of insulin (HCC)  E11.59    Z79.4     8. Long-term insulin use (HCC)  Z79.4     9. Hx of embolic stroke  U93.23        RECOMMENDATIONS: POET HINEMAN is a 70 y.o. female whose past medical history and cardiac risk factors include: Cardiomyopathy, chronic HFrEF (EF <20%), paroxysmal atrial fibrillation s/p TEE/cardioversion on oral anticoagulation, large pericardial effusion, hypertension, hyperlipidemia, insulin-dependent diabetes mellitus type 2, history of embolic stroke requiring tPA and mechanical thrombectomy, history of cardiogenic shock/PEA cardiac arrest immediately following thrombectomy, large left multi loculated extensive pleural effusion, advanced age, postmenopausal female.  Chronic HFrEF (heart failure with reduced ejection fraction) (HCC) Diagnosed in May 2022, LVEF <20% as compared to LVEF back in July 2021 40-45% Presumed diagnosis was nonischemic cardiomyopathy due to tachycardic induced; however, patient has failed to follow-up with regards to undergoing an ischemic evaluation and/or infiltrative work-up. Medications reconciled. Based on electronic medical records spironolactone discontinued secondary to hyperkalemia, amlodipine discontinued due to reduced LVEF, not currently on ARB/Arni due to prior history of hyperkalemia.   SGLT2i have not been added to  her medical regimen to the best of my knowledge and medical review - will consider at next visit.  Plan repeat echocardiogram to reevaluate LVEF as she has been on maximally tolerated therapy. We discussed undergoing an ischemic and infiltrative evaluation if her LVEF remains low. Will also check BMP, BNP prior to next office visit I had a long discussion with both patient and her daughter that she has received very appropriate care by my colleagues at [facility] given the course of the events and her medical condition.   I too agree based on the information available that she does have heart failure as she has been told by other medical providers.  She is more than welcome to consider this as a second opinion and resume her care with [facility] heart care but if she strongly wishes to transition her care to our practice would be more than happy to follow her going forward.  Both the patient and daughter are thankful for reviewing her electronic medical records, management of her complex cardiac conditions, and providing a professional evaluation of the current medical therapy and the care provided thus far.  They will consider which group they wish to follow up going forward. If they choose to follow up with me I would like to see her back in 4 weeks.    Paroxysmal A-fib (HCC) Rate control: Digoxin. Rhythm control: Amiodarone Thromboembolic prophylaxis: Eliquis [id number] SCORE is 7 which correlates to 9.6 % risk of stroke per year (CHF, hypertension, age, diabetes, prior stroke, female) History of TEE guided cardioversion 05/19/2020 Patient and daughter are made aware that amiodarone is off label use for management of paroxysmal atrial fibrillation.  It has a potential to affect multiple organs and therefore she will need follow-up evaluation with regards to thyroid function, liver function, ophthalmological evaluation, PFTs/chest x-ray. Most recent labs reviewed. Check TSH  Long term (current) use of anticoagulants Indication: Paroxysmal atrial fibrillation Reiterated the risks, benefits, and alternatives to oral anticoagulation.  Both patient and daughter are very of the complications of being on oral anticoagulation which has a potential to increase morbidity mortality.  They are aware that if she has any nature of injury despite the mechanism she should be evaluated by going to the closest ER via EMS to rule out intracranial and/or GI bleed.  Benign hypertension Within acceptable range but not at goal Medications  reconciled. Asked to keep a log of her blood pressures and to bring it in at the next office visit. Low-salt diet recommended  Mixed hyperlipidemia Continue atorvastatin 80 mg p.o. nightly. Most recent lipid profile notes an LDL <70 mg/dL Does not endorse myalgias. Continue to monitor  Type 2 diabetes mellitus with other circulatory complication, with long-term current use of insulin (HCC) Educated on the importance of glycemic control. Most recent hemoglobin A1c 7.6% Currently managed per primary team.  Hx of embolic stroke Status post tPA and mechanical thrombectomy Educated on the importance of secondary prevention.   FINAL MEDICATION LIST END OF ENCOUNTER: No orders of the defined types were placed in this encounter.   There are no discontinued medications.   Current Outpatient Medications:    acetaminophen (TYLENOL) 325 MG tablet, Take 750 mg by mouth 2 (two) times daily., Disp: , Rfl:    amiodarone (PACERONE) 200 MG tablet, Take 1 tablet (200 mg total) by mouth daily., Disp: 30 tablet, Rfl: 1   amLODipine (NORVASC) 5 MG tablet, TAKE 1 TABLET(5 MG) BY MOUTH DAILY, Disp: 90 tablet, Rfl: 1  apixaban (ELIQUIS) 5 MG TABS tablet, TAKE 1 TABLET(5 MG) BY MOUTH TWICE DAILY, Disp: 180 tablet, Rfl: 1   atorvastatin (LIPITOR) 80 MG tablet, Take 1 tablet (80 mg total) by mouth daily., Disp: 90 tablet, Rfl: 1   B-D UF III MINI PEN NEEDLES 31G X 5 MM MISC, USE WITH LANTUS PEN DAILY AS DIRECTED, Disp: 100 each, Rfl: 1   bisacodyl (DULCOLAX) 10 MG suppository, Place 1 suppository (10 mg total) rectally daily as needed for moderate constipation., Disp: 12 suppository, Rfl: 0   Blood Glucose Monitoring Suppl (ONETOUCH VERIO) w/Device KIT, Please use to check blood sugar up to three times daily. E11.65, Disp: 1 kit, Rfl: 0   Blood Pressure KIT, Monitor blood pressure twice a day, Disp: 1 kit, Rfl: 0   digoxin (LANOXIN) 0.125 MG tablet, Take 1 tablet (0.125 mg total) by mouth daily., Disp: 60  tablet, Rfl: 0   ferrous gluconate (FERGON) 324 MG tablet, Take 1 tablet (324 mg total) by mouth daily., Disp: 90 tablet, Rfl: 2   glucose blood (ONETOUCH VERIO) test strip, Please use to check blood sugar up to three times daily. E11.65, Disp: 100 each, Rfl: 12   isosorbide-hydrALAZINE (BIDIL) 20-37.5 MG tablet, Take 2 tablets by mouth 3 (three) times daily., Disp: 180 tablet, Rfl: 0   Lancet Devices (ONE TOUCH DELICA LANCING DEV) MISC, Please use to check blood sugar up to three times daily. E11.65, Disp: 1 each, Rfl: 0   LANTUS SOLOSTAR 100 UNIT/ML Solostar Pen, ADMINISTER 18 UNITS UNDER THE SKIN DAILY, Disp: 15 mL, Rfl: 0   lidocaine (LIDODERM) 5 %, Place 1 patch onto the skin daily. Remove & Discard patch within 12 hours or as directed by MD, Disp: 30 patch, Rfl: 0   Lidocaine 4 % PTCH, Apply 1 patch topically 2 (two) times daily. Apply to upper back, Disp: , Rfl:    Multiple Vitamin (MULTIVITAMIN WITH MINERALS) TABS tablet, Take 1 tablet by mouth daily., Disp: , Rfl:    OneTouch Delica Lancets 77L MISC, Please use to check blood sugar up to three times daily. E11.65, Disp: 100 each, Rfl: 12   pantoprazole (PROTONIX) 40 MG tablet, Take 1 tablet (40 mg total) by mouth daily., Disp: 90 tablet, Rfl: 1   polyethylene glycol (MIRALAX / GLYCOLAX) 17 g packet, Take 17 g by mouth 2 (two) times daily., Disp: 14 each, Rfl: 0   Semaglutide (OZEMPIC, 0.25 OR 0.5 MG/DOSE, [place]), Inject into the skin., Disp: , Rfl:    senna-docusate (SENOKOT-S) 8.6-50 MG tablet, Take 2 tablets by mouth 2 (two) times daily., Disp: 120 tablet, Rfl: 2   triamcinolone cream (KENALOG) 0.1 %, Apply 1 application topically 2 (two) times daily., Disp: 30 g, Rfl: 0  Orders Placed This Encounter  Procedures   TSH   B Nat Peptide   Basic metabolic panel   EKG 39-QZES   ECHOCARDIOGRAM COMPLETE    There are no Patient Instructions on file for this visit.   --Continue cardiac medications as reconciled in final medication  list. --Return in about 4 weeks (around 09/25/2020) for Follow up, heart failure management., A. fib. Or sooner if needed. --Continue follow-up with your primary care physician regarding the management of your other chronic comorbid conditions.  Patient's questions and concerns were addressed to her satisfaction. She voices understanding of the instructions provided during this encounter.   This note was created using a voice recognition software as a result there may be grammatical errors inadvertently enclosed that do not  reflect the nature of this encounter. Every attempt is made to correct such errors.  Rex Kras, [place], [facility] [facility]  Pager: [phone] Office: [phone]

## 2020-09-02 ENCOUNTER — Other Ambulatory Visit: Payer: Self-pay

## 2020-09-02 DIAGNOSIS — E119 Type 2 diabetes mellitus without complications: Secondary | ICD-10-CM | POA: Diagnosis not present

## 2020-09-02 DIAGNOSIS — I69391 Dysphagia following cerebral infarction: Secondary | ICD-10-CM | POA: Diagnosis not present

## 2020-09-02 DIAGNOSIS — Z9181 History of falling: Secondary | ICD-10-CM | POA: Diagnosis not present

## 2020-09-02 DIAGNOSIS — R531 Weakness: Secondary | ICD-10-CM | POA: Diagnosis not present

## 2020-09-02 DIAGNOSIS — I428 Other cardiomyopathies: Secondary | ICD-10-CM | POA: Diagnosis not present

## 2020-09-02 DIAGNOSIS — M9689 Other intraoperative and postprocedural complications and disorders of the musculoskeletal system: Secondary | ICD-10-CM | POA: Diagnosis not present

## 2020-09-02 DIAGNOSIS — K219 Gastro-esophageal reflux disease without esophagitis: Secondary | ICD-10-CM | POA: Diagnosis not present

## 2020-09-02 DIAGNOSIS — I700 Atherosclerosis of aorta: Secondary | ICD-10-CM | POA: Diagnosis not present

## 2020-09-02 DIAGNOSIS — Y848 Other medical procedures as the cause of abnormal reaction of the patient, or of later complication, without mention of misadventure at the time of the procedure: Secondary | ICD-10-CM | POA: Diagnosis not present

## 2020-09-02 DIAGNOSIS — I480 Paroxysmal atrial fibrillation: Secondary | ICD-10-CM | POA: Diagnosis not present

## 2020-09-02 DIAGNOSIS — Z7901 Long term (current) use of anticoagulants: Secondary | ICD-10-CM | POA: Diagnosis not present

## 2020-09-02 DIAGNOSIS — E782 Mixed hyperlipidemia: Secondary | ICD-10-CM | POA: Diagnosis not present

## 2020-09-02 DIAGNOSIS — I69311 Memory deficit following cerebral infarction: Secondary | ICD-10-CM | POA: Diagnosis not present

## 2020-09-02 DIAGNOSIS — I5022 Chronic systolic (congestive) heart failure: Secondary | ICD-10-CM | POA: Diagnosis not present

## 2020-09-02 DIAGNOSIS — I69398 Other sequelae of cerebral infarction: Secondary | ICD-10-CM | POA: Diagnosis not present

## 2020-09-02 DIAGNOSIS — Z79899 Other long term (current) drug therapy: Secondary | ICD-10-CM | POA: Diagnosis not present

## 2020-09-02 DIAGNOSIS — I110 Hypertensive heart disease with heart failure: Secondary | ICD-10-CM | POA: Diagnosis not present

## 2020-09-02 DIAGNOSIS — J9811 Atelectasis: Secondary | ICD-10-CM | POA: Diagnosis not present

## 2020-09-02 NOTE — Patient Outreach (Signed)
[facility]) Care Management  09/02/2020  [name] [name] 1950/12/14 [id number]   Telephone outreach to patient to obtain mRS was successfully completed. MRS= 0  Thank you, Vanice Sarah [facility] Care Management Assistant

## 2020-09-03 ENCOUNTER — Ambulatory Visit (INDEPENDENT_AMBULATORY_CARE_PROVIDER_SITE_OTHER): Payer: Medicare Other | Admitting: Family Medicine

## 2020-09-03 ENCOUNTER — Other Ambulatory Visit: Payer: Self-pay

## 2020-09-03 ENCOUNTER — Encounter: Payer: Self-pay | Admitting: Family Medicine

## 2020-09-03 VITALS — BP 150/60 | HR 75 | Ht 63.0 in | Wt 167.4 lb

## 2020-09-03 DIAGNOSIS — I5042 Chronic combined systolic (congestive) and diastolic (congestive) heart failure: Secondary | ICD-10-CM | POA: Diagnosis not present

## 2020-09-03 DIAGNOSIS — L304 Erythema intertrigo: Secondary | ICD-10-CM

## 2020-09-03 MED ORDER — CLOTRIMAZOLE 1 % EX OINT: TOPICAL_OINTMENT | CUTANEOUS | 1 refills | Status: DC

## 2020-09-03 NOTE — Patient Instructions (Signed)
Thank you for coming to see me today. It was a pleasure.   Apply cream to affected area twice a day  I will look into the Dexcom   I have ordered the labs for your Cardiologist  Please follow-up with PCP as needed  If you have any questions or concerns, please do not hesitate to call the office at [phone].  Best,   Dana Allan, MD    Intertrigo Intertrigo is skin irritation (inflammation) that happens in warm, moist areas of the body. The irritation can cause a rash and make skin raw and itchy. The rash is usually pink or red. It happens mostly between folds of skin or where skin rubs together, such as: Between the toes. In the armpits. In the groin area. Under the belly. Under the breasts. Around the butt area. This condition is not passed from person to person (is not contagious). What are the causes? Heat, moisture, rubbing, and not enough air movement. The condition can be made worse by: Sweat. Bacteria. A fungus, such as yeast. What increases the risk? Moisture in your skin folds. You are more likely to develop this condition if you: Have diabetes. Are overweight. Are not able to move around. Live in a warm and moist climate. Wear splints, braces, or other medical devices. Are not able to control your pee (urine) or poop (stool). What are the signs or symptoms? A pink or red skin rash in the skin fold or near the skin fold. Raw or scaly skin. Itching. A burning feeling. Bleeding. Leaking fluid. A bad smell. How is this treated? Cleaning and drying your skin. Taking an antibiotic medicine or using an antibiotic skin cream for a bacterial infection. Using an antifungal cream on your skin or taking pills for an infection that was caused by a fungus, such as yeast. Using a steroid ointment to stop the itching and irritation. Separating the skin fold with a clean cotton cloth to absorb moisture and allow air to flow into the area. Follow these instructions  at home: Keep the affected area clean and dry. Do not scratch your skin. Stay cool as much as you can. Use an air conditioner or a fan, if you have one. Apply over-the-counter and prescription medicines only as told by your doctor. If you were prescribed an antibiotic medicine, use it as told by your doctor. Do not stop using the antibiotic even if your condition starts to get better. Keep all follow-up visits as told by your doctor. This is important. How is this prevented?  Stay at a healthy weight. Take care of your feet. This is very important if you have diabetes. You should: Wear shoes that fit well. Keep your feet dry. Wear clean cotton or wool socks. Protect the skin in your groin and butt area as told by your doctor. To do this: Follow a regular cleaning routine. Use creams, powders, or ointments that protect your skin. Change protection pads often. Do not wear tight clothes. Wear clothes that: Are loose. Take moisture away from your body. Are made of cotton. Wear a bra that gives good support, if needed. Shower and dry yourself well after being active. Use a hair dryer on a cool setting to dry between skin folds. Keep your blood sugar under control if you have diabetes. Contact a doctor if: Your symptoms do not get better with treatment. Your symptoms get worse or they spread. You notice more redness and warmth. You have a fever. Summary Intertrigo is skin  irritation that occurs when folds of skin rub together. This condition is caused by heat, moisture, and rubbing. This condition may be treated by cleaning and drying your skin and with medicines. Apply over-the-counter and prescription medicines only as told by your doctor. Keep all follow-up visits as told by your doctor. This is important. This information is not intended to replace advice given to you by your health care provider. Make sure you discuss any questions you have with your healthcare provider. Document  Revised: 10/06/2017 Document Reviewed: 10/06/2017 Elsevier Patient Education  2022 [employer].

## 2020-09-03 NOTE — Progress Notes (Signed)
Dexcom    SUBJECTIVE:   CHIEF COMPLAINT / HPI: breast rash  Rash under both breasts Started couple of days ago. Stinging sensation especially when sweaty. No itching or blisters.  Reports wearing bra that was rubbing at areas.   PERTINENT  PMH / PSH:  Combined CHF Type 2 DM   OBJECTIVE:   BP (!) 150/60   Pulse 75   Ht 5\' 3"  (1.6 m)   Wt 167 lb 6 oz (75.9 kg)   SpO2 97%   BMI 29.65 kg/m    General: Alert, no acute distress Cardio: Normal S1 and S2, RRR, no r/m/g Pulm: CTAB, normal work of breathing Abdomen: Bowel sounds normal. Abdomen soft and non-tender.  Extremities: No peripheral edema.      ASSESSMENT/PLAN:   Intertrigo Rash consistent with intertrigo -Clotrimazole ointment BID x 7/7 and prn -Loose clothing -Keep area dry -Follow up if no improvement in symptoms  Chronic combined systolic and diastolic CHF (congestive heart failure) (HCC) Dr had ordered labs for patient.  Patient unable to get transportation to have labs done for cardiologist. Will order TSH, BMP and BNP today and have ccd to Cards Follow up with Cardiology as scheduled     Odis Hollingshead, MD [facility]

## 2020-09-04 ENCOUNTER — Encounter: Payer: Self-pay | Admitting: Family Medicine

## 2020-09-04 DIAGNOSIS — L304 Erythema intertrigo: Secondary | ICD-10-CM

## 2020-09-04 HISTORY — DX: Erythema intertrigo: L30.4

## 2020-09-04 NOTE — Assessment & Plan Note (Signed)
Dr Odis Hollingshead had ordered labs for patient.  Patient unable to get transportation to have labs done for cardiologist. Will order TSH, BMP and BNP today and have ccd to Cards Follow up with Cardiology as scheduled

## 2020-09-04 NOTE — Assessment & Plan Note (Signed)
Rash consistent with intertrigo -Clotrimazole ointment BID x 7/7 and prn -Loose clothing -Keep area dry -Follow up if no improvement in symptoms

## 2020-09-10 ENCOUNTER — Other Ambulatory Visit: Payer: Self-pay

## 2020-09-10 ENCOUNTER — Other Ambulatory Visit: Payer: Self-pay | Admitting: Family Medicine

## 2020-09-10 ENCOUNTER — Other Ambulatory Visit: Payer: Medicare Other

## 2020-09-10 ENCOUNTER — Ambulatory Visit (INDEPENDENT_AMBULATORY_CARE_PROVIDER_SITE_OTHER): Payer: Medicare Other | Admitting: Pharmacist

## 2020-09-10 DIAGNOSIS — E119 Type 2 diabetes mellitus without complications: Secondary | ICD-10-CM | POA: Diagnosis not present

## 2020-09-10 DIAGNOSIS — I5042 Chronic combined systolic (congestive) and diastolic (congestive) heart failure: Secondary | ICD-10-CM

## 2020-09-10 NOTE — Patient Instructions (Addendum)
[name] it was a pleasure seeing you today.   Please do the following:  Increase your Ozempic to 1mg  as directed today during your appointment. You may take 2 doses of your 0.5mg  pens to make 1mg . Decrease your Lantus to 15 units when you increase your Ozempic. If you have any questions or if you believe something has occurred because of this change, call me or your doctor to let one of know.  Continue checking blood sugars at home. It's really important that you record these and bring these in to your next doctor's appointment.  Continue making the lifestyle changes we've discussed together during our visit. Diet and exercise play a significant role in improving your blood sugars.  Follow-up with me one month.   Hypoglycemia or low blood sugar:   Low blood sugar can happen quickly and may become an emergency if not treated right away.   While this shouldn't happen often, it can be brought upon if you skip a meal or do not eat enough. Also, if your insulin or other diabetes medications are dosed too high, this can cause your blood sugar to go to low.   Warning signs of low blood sugar include: Feeling shaky or dizzy Feeling weak or tired  Excessive hunger Feeling anxious or upset  Sweating even when you aren't exercising  What to do if I experience low blood sugar? Follow the Rule of 15 Check your blood sugar with your meter. If lower than 70, proceed to step 2.  Treat with 15 grams of fast acting carbs which is found in 3-4 glucose tablets. If none are available you can try hard candy, 1 tablespoon of sugar or honey,4 ounces of fruit juice, or 6 ounces of REGULAR soda.  Re-check your sugar in 15 minutes. If it is still below 70, do what you did in step 2 again. If your blood sugar has come back up, go ahead and eat a snack or small meal made up of complex carbs (ex. Whole grains) and protein at this time to avoid recurrence of low blood sugar.

## 2020-09-10 NOTE — Progress Notes (Signed)
Subjective:    Patient ID: [name] [name], female    DOB: [birth year], 70 y.o.   MRN: [id number]  HPI Patient is a 70 y.o. female who presents for diabetes management. She is in good spirits and presents with assistance of walker. Patient was referred and last seen by Primary Care Provider on 09/03/20.  Patient reports diabetes was diagnosed around 17 years ago.   Insurance coverage/medication affordability: Medicare/Medicaid  Current diabetes medications include: Lantus 18 units every night, Ozempic 0.5mg  on Friday or Saturday Current hypertension medications include: amlodipine 5mg , isosorbide-hydralazine 20-37.5mg  2 tablets TID Current hyperlipidemia medications include: atorvastatin 80mg  Patient states that She is taking her medications as prescribed. Patient reports adherence with medications.   Do you feel that your medications are working for you?  yes  Have you been experiencing any side effects to the medications prescribed? no  Do you have any problems obtaining medications due to transportation or finances?  no      Patient denies hypoglycemic events. Patient denies polyuria (increased urination).  Patient denies polyphagia (increased appetite).  Patient denies polydipsia (increased thirst).  Patient denies neuropathy (nerve pain). Patient denies visual changes. Patient reports self foot exams.   Home fasting blood sugars: 134-160's   Objective:   Labs:   Physical Exam Neurological:     Mental Status: She is alert and oriented to person, place, and time.    Review of Systems  Gastrointestinal:  Negative for nausea and vomiting.   Lab Results  Component Value Date   HGBA1C 7.6 (A) 07/23/2020   HGBA1C 10.5 (H) 05/13/2020   HGBA1C 10.4 (H) 02/04/2020    Lab Results  Component Value Date   MICRALBCREAT 30-300 09/06/2017    Lipid Panel     Component Value Date/Time   CHOL 87 05/13/2020 0527   CHOL 132 06/08/2017 1110   TRIG 46 05/13/2020 0527    HDL 17 (L) 05/13/2020 0527   HDL 35 (L) 06/08/2017 1110   CHOLHDL 5.1 05/13/2020 0527   VLDL 9 05/13/2020 0527   LDLCALC 61 05/13/2020 0527   LDLCALC 67 06/08/2017 1110    Clinical Atherosclerotic Cardiovascular Disease (ASCVD): Yes  The ASCVD Risk score 07/13/2020 DC Jr., et al., 2013) failed to calculate for the following reasons:   The patient has a prior MI or stroke diagnosis    Assessment/Plan:   T2DM is controlled likely due to optimal adherence to therapy. Will increase Ozempic to 1mg  as patient has been taking 0.5mg  long-term and will subsequently decrease Lantus from 18 units to 15 units. Will continue to work towards increasing Ozempic to 2mg  assuming patient tolerates well. Discussed additional therapy with patient but patient prefers not to add any additional medication and continue to adjust the regimen she is already on. Following instruction patient verbalized understanding of treatment plan.    Decreased dose of basal insulin Lantus to 15 units once daily.   Increased dose of GLP-1 Ozempic to 1mg  once weekly  Extensively discussed pathophysiology of diabetes, dietary effects on blood sugar control, and recommended lifestyle interventions. Counseled on s/sx of and management of hypoglycemia Next A1C anticipated October 2022.  Will submit for Dexcom application to DME supplier  ASCVD risk - secondary prevention in patient with diabetes. Last LDL is controlled.   Continued atorvastatin 80 mg.   Follow-up appointment four weeks to review sugar readings. Written patient instructions provided.  This appointment required 40 minutes of direct patient care.  Thank you for involving pharmacy to assist in  providing this patient's care.  Patient seen with Shellia Carwin, PharmD Candidate

## 2020-09-10 NOTE — Assessment & Plan Note (Signed)
T2DM is controlled likely due to optimal adherence to therapy. Will increase Ozempic to 1mg  as patient has been taking 0.5mg  long-term and will subsequently decrease Lantus from 18 units to 15 units. Will continue to work towards increasing Ozempic to 2mg  assuming patient tolerates well. Discussed additional therapy with patient but patient prefers not to add any additional medication and continue to adjust the regimen she is already on. Following instruction patient verbalized understanding of treatment plan.    1. Decreased dose of basal insulin Lantus to 15 units once daily.   2. Increased dose of GLP-1 Ozempic to 1mg  once weekly  3. Extensively discussed pathophysiology of diabetes, dietary effects on blood sugar control, and recommended lifestyle interventions. 4. Counseled on s/sx of and management of hypoglycemia 5. Next A1C anticipated October 2022.  6. Will submit for Dexcom application to DME supplier

## 2020-09-11 ENCOUNTER — Other Ambulatory Visit: Payer: Self-pay | Admitting: Family Medicine

## 2020-09-11 LAB — BASIC METABOLIC PANEL
BUN/Creatinine Ratio: 12 (ref 12–28)
BUN: 10 mg/dL (ref 8–27)
CO2: 28 mmol/L (ref 20–29)
Calcium: 9.7 mg/dL (ref 8.7–10.3)
Chloride: 100 mmol/L (ref 96–106)
Creatinine, Ser: 0.82 mg/dL (ref 0.57–1.00)
Glucose: 198 mg/dL — ABNORMAL HIGH (ref 65–99)
Potassium: 4.3 mmol/L (ref 3.5–5.2)
Sodium: 142 mmol/L (ref 134–144)
eGFR: 78 mL/min/{1.73_m2} (ref >59)

## 2020-09-11 LAB — BRAIN NATRIURETIC PEPTIDE: BNP: 145.5 pg/mL — ABNORMAL HIGH (ref 0.0–100.0)

## 2020-09-11 LAB — TSH: TSH: 3.08 u[IU]/mL (ref 0.450–4.500)

## 2020-09-17 NOTE — Progress Notes (Signed)
Please call and schedule pt for echo

## 2020-09-22 ENCOUNTER — Other Ambulatory Visit: Payer: Self-pay

## 2020-09-22 DIAGNOSIS — E1165 Type 2 diabetes mellitus with hyperglycemia: Secondary | ICD-10-CM

## 2020-09-23 MED ORDER — LANTUS SOLOSTAR 100 UNIT/ML ~~LOC~~ SOPN: PEN_INJECTOR | SUBCUTANEOUS | 0 refills | Status: DC

## 2020-09-23 MED ORDER — OZEMPIC (0.25 OR 0.5 MG/DOSE) 2 MG/1.5ML ~~LOC~~ SOPN: PEN_INJECTOR | SUBCUTANEOUS | 1 refills | Status: DC

## 2020-09-26 ENCOUNTER — Telehealth: Payer: Self-pay

## 2020-09-26 NOTE — Telephone Encounter (Signed)
Received fax from pharmacy that insurance does not cover Ozempic. Please see preferred drug list for alternatives.      Per Medicaid guidelines, patient would need to have tried and failed at least 2 preferred medications. Please advise.   Veronda Prude, RN

## 2020-09-30 ENCOUNTER — Ambulatory Visit: Payer: Medicare Other

## 2020-09-30 ENCOUNTER — Other Ambulatory Visit: Payer: Self-pay

## 2020-09-30 DIAGNOSIS — I5022 Chronic systolic (congestive) heart failure: Secondary | ICD-10-CM

## 2020-10-02 ENCOUNTER — Ambulatory Visit: Payer: Medicare Other | Admitting: Cardiology

## 2020-10-06 ENCOUNTER — Ambulatory Visit: Payer: Medicare Other | Admitting: Cardiology

## 2020-10-09 ENCOUNTER — Ambulatory Visit (INDEPENDENT_AMBULATORY_CARE_PROVIDER_SITE_OTHER): Payer: Medicare Other | Admitting: Pharmacist

## 2020-10-09 ENCOUNTER — Other Ambulatory Visit: Payer: Self-pay

## 2020-10-09 DIAGNOSIS — I639 Cerebral infarction, unspecified: Secondary | ICD-10-CM

## 2020-10-09 DIAGNOSIS — E119 Type 2 diabetes mellitus without complications: Secondary | ICD-10-CM

## 2020-10-09 MED ORDER — PANTOPRAZOLE SODIUM 40 MG PO TBEC: 40.0000 mg | DELAYED_RELEASE_TABLET | Freq: Every day | ORAL | 0 refills | Status: DC

## 2020-10-09 MED ORDER — TRULICITY 1.5 MG/0.5ML ~~LOC~~ SOAJ
1.5000 mg | SUBCUTANEOUS | 0 refills | Status: DC
Start: 2020-10-09 — End: 2020-10-13

## 2020-10-09 NOTE — Progress Notes (Signed)
Subjective:    Patient ID: [name] [name], female    DOB: 04/08/1950, 71 y.o.   MRN: [id number]  HPI Patient is a 70 y.o. female who presents for diabetes management. She is in good spirits and presents with assistance of walker. Patient was referred and last seen by Primary Care Provider on 09/03/20. Last seen in pharmacy clinic on 09/10/20.  Patient reports blood glucose "has been great." This morning was 138, usually 150-160's, sometimes sees 175. Patient states blood pressure was also good at 116/69, HR 69, and reports weight being around 140 lbs. Patient reports trouble getting her medications due to identity issues (people claiming to be patient) and issues with the pharmacy.  Patient reports diabetes was diagnosed around 17 years ago.   Insurance coverage/medication affordability: Medicare/Medicaid, no Part D  Current diabetes medications include: Lantus 18 units every night, Ozempic 0.5mg  on Friday or Saturday (unable to titrate to 1mg  due to needing prior authorization) Current hypertension medications include: amlodipine 5mg , isosorbide-hydralazine 20-37.5mg  2 tablets TID Current hyperlipidemia medications include: atorvastatin 80mg  Patient states that She is taking her medications as prescribed. Patient reports adherence with medications.    Do you feel that your medications are working for you?  yes  Have you been experiencing any side effects to the medications prescribed? No   Do you have any problems obtaining medications due to transportation or finances?  no    Patient reports that she loves diet coke without caffeine and drinks water. She has been eating more fruits and veggies.  Patient denies hypoglycemic events.  Patient denies polyuria (increased urination). Patient denies polyphagia (increased appetite). Patient denies polydipsia (increased thirst).  Patient denies neuropathy (nerve pain) Patient denies visual changes. Patient reports self foot  exams.  Objective:   Labs:   Physical Exam Neurological:     Mental Status: She is alert and oriented to person, place, and time.    Review of Systems  Gastrointestinal:  Negative for nausea and vomiting.   Lab Results  Component Value Date   HGBA1C 7.6 (A) 07/23/2020   HGBA1C 10.5 (H) 05/13/2020   HGBA1C 10.4 (H) 02/04/2020    Lab Results  Component Value Date   MICRALBCREAT 30-300 09/06/2017    Lipid Panel     Component Value Date/Time   CHOL 87 05/13/2020 0527   CHOL 132 06/08/2017 1110   TRIG 46 05/13/2020 0527   HDL 17 (L) 05/13/2020 0527   HDL 35 (L) 06/08/2017 1110   CHOLHDL 5.1 05/13/2020 0527   VLDL 9 05/13/2020 0527   LDLCALC 61 05/13/2020 0527   LDLCALC 67 06/08/2017 1110    Clinical Atherosclerotic Cardiovascular Disease (ASCVD): Yes  The ASCVD Risk score (Arnett DK, et al., 2019) failed to calculate for the following reasons:   The patient has a prior MI or stroke diagnosis    Assessment/Plan:   T2DM is mostly controlled likely due to optimal adherence to therapy, however, patient has not presented to visit with glucometer to confirm readings. Due to previous issues with insurance and coverage, patient now unable to obtain Ozempic due to Medicaid and will need to trial Victoza or Trulicity. Patient amendable to trial of Trulicity 1.5mg  once weekly. Patient still not amendable to additional therapy, despite benefits of SGLT2. Will continue to discuss at future visits. Following instruction patient verbalized understanding of treatment plan.    Continued basal insulin Lantus 18 units once daily.   Switched Ozempic 0.5mg  to Trulicity 1.5mg  once weekly Extensively discussed pathophysiology of  diabetes, dietary effects on blood sugar control, and recommended lifestyle interventions. Counseled on s/sx of and management of hypoglycemia Next A1C anticipated October 2022.   ASCVD risk - secondary prevention in patient with diabetes. Last LDL is controlled.    Continued atorvastatin 80 mg.   Follow-up appointment four weeks to review sugar readings. Written patient instructions provided.  This appointment required 40 minutes of direct patient care.  Thank you for involving pharmacy to assist in providing this patient's care.  Patient seen with Shellia Carwin, PharmD Candidate, and Sula Soda, PharmD - PGY-1 Resident.

## 2020-10-09 NOTE — Patient Instructions (Addendum)
Ms. Below it was a pleasure seeing you today.   Please do the following:  We are switching your Ozempic to Trulicity once weekly as directed today during your appointment. If you have any questions or if you believe something has occurred because of this change, call me or your doctor to let one of [place] know.  Continue checking blood sugars at home. It's really important that you record these and bring these in to your next doctor's appointment.  Continue making the lifestyle changes we've discussed together during our visit. Diet and exercise play a significant role in improving your blood sugars.  Follow-up with me in one month.    Hypoglycemia or low blood sugar:   Low blood sugar can happen quickly and may become an emergency if not treated right away.   While this shouldn't happen often, it can be brought upon if you skip a meal or do not eat enough. Also, if your insulin or other diabetes medications are dosed too high, this can cause your blood sugar to go to low.   Warning signs of low blood sugar include: Feeling shaky or dizzy Feeling weak or tired  Excessive hunger Feeling anxious or upset  Sweating even when you aren't exercising  What to do if I experience low blood sugar? Follow the Rule of 15 Check your blood sugar with your meter. If lower than 70, proceed to step 2.  Treat with 15 grams of fast acting carbs which is found in 3-4 glucose tablets. If none are available you can try hard candy, 1 tablespoon of sugar or honey,4 ounces of fruit juice, or 6 ounces of REGULAR soda.  Re-check your sugar in 15 minutes. If it is still below 70, do what you did in step 2 again. If your blood sugar has come back up, go ahead and eat a snack or small meal made up of complex carbs (ex. Whole grains) and protein at this time to avoid recurrence of low blood sugar.

## 2020-10-09 NOTE — Assessment & Plan Note (Signed)
T2DM is mostly controlled likely due to optimal adherence to therapy, however, patient has not presented to visit with glucometer to confirm readings. Due to previous issues with insurance and coverage, patient now unable to obtain Ozempic due to Medicaid and will need to trial Victoza or Trulicity. Patient amendable to trial of Trulicity 1.5mg  once weekly. Patient still not amendable to additional therapy, despite benefits of SGLT2. Will continue to discuss at future visits. Following instruction patient verbalized understanding of treatment plan.    1. Continued basal insulin Lantus 18 units once daily.   2. Switched Ozempic 0.5mg  to Trulicity 1.5mg  once weekly 3. Extensively discussed pathophysiology of diabetes, dietary effects on blood sugar control, and recommended lifestyle interventions. 4. Counseled on s/sx of and management of hypoglycemia 5. Next A1C anticipated October 2022.

## 2020-10-13 ENCOUNTER — Other Ambulatory Visit: Payer: Self-pay

## 2020-10-13 ENCOUNTER — Ambulatory Visit: Payer: Medicare Other | Admitting: Cardiology

## 2020-10-13 ENCOUNTER — Other Ambulatory Visit: Payer: Self-pay | Admitting: Family Medicine

## 2020-10-13 ENCOUNTER — Encounter: Payer: Self-pay | Admitting: Cardiology

## 2020-10-13 VITALS — BP 124/88 | HR 69 | Temp 97.8°F | Resp 16 | Ht 63.0 in | Wt 168.0 lb

## 2020-10-13 DIAGNOSIS — E1159 Type 2 diabetes mellitus with other circulatory complications: Secondary | ICD-10-CM

## 2020-10-13 DIAGNOSIS — Z9889 Other specified postprocedural states: Secondary | ICD-10-CM

## 2020-10-13 DIAGNOSIS — Z8673 Personal history of transient ischemic attack (TIA), and cerebral infarction without residual deficits: Secondary | ICD-10-CM

## 2020-10-13 DIAGNOSIS — I480 Paroxysmal atrial fibrillation: Secondary | ICD-10-CM

## 2020-10-13 DIAGNOSIS — Z7901 Long term (current) use of anticoagulants: Secondary | ICD-10-CM

## 2020-10-13 DIAGNOSIS — I5032 Chronic diastolic (congestive) heart failure: Secondary | ICD-10-CM

## 2020-10-13 DIAGNOSIS — E782 Mixed hyperlipidemia: Secondary | ICD-10-CM

## 2020-10-13 DIAGNOSIS — Z794 Long term (current) use of insulin: Secondary | ICD-10-CM

## 2020-10-13 DIAGNOSIS — I10 Essential (primary) hypertension: Secondary | ICD-10-CM

## 2020-10-13 NOTE — Progress Notes (Signed)
Date:  10/13/2020   ID:  [name] [name], DOB 1950/06/27, MRN [id number]  PCP:  Carollee Leitz, MD  Cardiologist:  Rex Kras, DO, [facility] (established care 08/28/2020) Former Cardiology Providers: Dr. Minus Breeding, Dr. Berniece Salines Advanced heart failure specialist: Dr. Haroldine Laws and Dr. Aundra Dubin Cardiac electrophysiologist: Dr. Caryl Comes  Date: 10/13/20 Last Office Visit: 08/28/2020  Chief Complaint  Patient presents with   Atrial Fibrillation   Follow-up   Congestive Heart Failure   Results    HPI  [name] [name] is a 70 y.o. female who presents to the office with a chief complaint of " heart failure management and discuss test results." Patient's past medical history and cardiovascular risk factors include: Recovered cardiomyopathy (prior LVEF less than 20% now 55-60% 09/22), chronic HFpEF, paroxysmal atrial fibrillation s/p TEE/cardioversion on oral anticoagulation, large pericardial effusion, hypertension, hyperlipidemia, insulin-dependent diabetes mellitus type 2, history of embolic stroke requiring tPA and mechanical thrombectomy, history of cardiogenic shock/PEA cardiac arrest immediately following thrombectomy, large left multi loculated extensive pleural effusion, advanced age, postmenopausal female.  She is referred to the office at the request of Carollee Leitz, MD for evaluation of establish care given her chronic heart failure.  Patient's daughter [name] [name] was present via FaceTime during today's encounter.  Patient was initially seen in August 2022 as a second opinion with regards to the management of congestive heart failure.  She was following up with [facility] and was referred to the office to reestablish care.  Please refer to the initial consultation note for additional details.  At last office visit I reconfirmed that she was being treated appropriately given her diagnosis of congestive heart failure and that she is more than welcome to follow-up with [facility]; however, patient  chooses to transfer care to [facility] cardiovascular.  Since last office visit patient had an echocardiogram which notes improvement in LVEF with continued grade 3 diastolic impairment with elevated left atrial pressures.  Clinically she is euvolemic and not in congestive heart failure.  She does not have a list of her medications or medication bottles with her to further titrate her medications during today's encounter.  In the past digoxin was discontinued and due to borderline elevated therapeutic levels, spironolactone was discontinued secondary to hyperkalemia, amlodipine was discontinued due to reduced LVEF, ARB/Arni were discontinued due to history of hyperkalemia, patient has not been tried on SGL 2 inhibitors.   FUNCTIONAL STATUS: No structured exercise program or daily routine.   ALLERGIES: Allergies  Allergen Reactions   Amiodarone Shortness Of Breath and Other (See Comments)    Soreness, wheezing, and weakness also Update:  tolerating Amio PO 06/2020   Fruit & Vegetable Daily [Nutritional Supplements] Shortness Of Breath, Swelling and Other (See Comments)    All fruits cause tongue swelling and numbness Organic fruits are okay to eat   Metoprolol Shortness Of Breath, Other (See Comments) and Cough    Soreness, wheezing, and weakness also   Other Shortness Of Breath, Swelling and Other (See Comments)    Tree nuts cause tongue swelling and numbness   Peanut-Containing Drug Products Shortness Of Breath, Swelling and Other (See Comments)    Tongue swelling and numbness   Aspirin Nausea And Vomiting and Other (See Comments)    Cannot take due to liver issues   Tylenol [Acetaminophen] Other (See Comments)    Cannot take due to liver issues- tolerating 650 mg tablets in 2022   Lactose Intolerance (Gi) Diarrhea and Nausea And Vomiting    MEDICATION LIST  PRIOR TO VISIT: Current Meds  Medication Sig   acetaminophen (TYLENOL) 325 MG tablet Take 750 mg by mouth 2 (two) times daily.    amiodarone (PACERONE) 200 MG tablet Take 1 tablet (200 mg total) by mouth daily.   amLODipine (NORVASC) 5 MG tablet TAKE 1 TABLET(5 MG) BY MOUTH DAILY   apixaban (ELIQUIS) 5 MG TABS tablet TAKE 1 TABLET(5 MG) BY MOUTH TWICE DAILY   atorvastatin (LIPITOR) 80 MG tablet Take 1 tablet (80 mg total) by mouth daily.   B-D UF III MINI PEN NEEDLES 31G X 5 MM MISC USE WITH LANTUS PEN DAILY AS DIRECTED   bisacodyl (DULCOLAX) 10 MG suppository Place 1 suppository (10 mg total) rectally daily as needed for moderate constipation.   Blood Glucose Monitoring Suppl (ONETOUCH VERIO) w/Device KIT Please use to check blood sugar up to three times daily. E11.65   Blood Pressure KIT Monitor blood pressure twice a day   digoxin (LANOXIN) 0.125 MG tablet TAKE 1 TABLET(0.125 MG) BY MOUTH DAILY   ferrous gluconate (FERGON) 324 MG tablet Take 1 tablet (324 mg total) by mouth daily.   glucose blood (ONETOUCH VERIO) test strip Please use to check blood sugar up to three times daily. E11.65   insulin glargine (LANTUS SOLOSTAR) 100 UNIT/ML Solostar Pen ADMINISTER 18 UNITS UNDER THE SKIN DAILY   isosorbide-hydrALAZINE (BIDIL) 20-37.5 MG tablet Take 2 tablets by mouth 3 (three) times daily.   Lancet Devices (ONE TOUCH DELICA LANCING DEV) MISC Please use to check blood sugar up to three times daily. E11.65   lidocaine (LIDODERM) 5 % Place 1 patch onto the skin daily. Remove & Discard patch within 12 hours or as directed by MD   OneTouch Delica Lancets 69C MISC Please use to check blood sugar up to three times daily. E11.65   pantoprazole (PROTONIX) 40 MG tablet Take 1 tablet (40 mg total) by mouth daily.   polyethylene glycol (MIRALAX / GLYCOLAX) 17 g packet Take 17 g by mouth 2 (two) times daily.     PAST MEDICAL HISTORY: Past Medical History:  Diagnosis Date   Abdominal pain    Acute respiratory failure with hypoxia (HCC)    Atrial flutter (HCC)    Atrial tachycardia (HCC) 07/29/2019   Back pain    Chronic  anticoagulation - on Eliquis for chronic afib 02/04/2020   Chronic combined systolic and diastolic CHF (congestive heart failure) ([place]) 02/04/2020   Constipation    COVID-19 02/04/2020   Diabetes mellitus without complication ([place])    Dysphagia 02/03/2020   Epigastric pain 02/03/2020   Fatigue    Full dentures    Hemorrhoids    HFrEF (heart failure with reduced ejection fraction) ([place]) 03/18/2020   History of esophageal stricture 07/29/2019   History of noncompliance with medical treatment, presenting hazards to health    Hyperlipidemia    Hypertension    Lack of adequate sleep    Lack of adequate sleep    Liver mass    Nonrheumatic tricuspid valve regurgitation 03/18/2020   Oral thrush 02/04/2020   PAF (paroxysmal atrial fibrillation) (HCC)    Pericardial effusion 05/12/2010   Pleural effusion 06/07/2020   Pleural effusion on left    Rash    on right breast   Stroke (cerebrum) ([place]) 05/12/2020   Uncontrolled type 2 diabetes mellitus with hyperglycemia ([place]) 06/08/2017   Wears glasses     PAST SURGICAL HISTORY: Past Surgical History:  Procedure Laterality Date   BREAST [place]  BREAST EXCISIONAL BIOPSY Left    CARDIOVERSION N/A 07/31/2019   Procedure: CARDIOVERSION;  Surgeon: Pixie Casino, MD;  Location: [place];  Service: Cardiovascular;  Laterality: N/A;   CARDIOVERSION N/A 05/19/2020   Procedure: CARDIOVERSION;  Surgeon: Jolaine Artist, MD;  Location: [place];  Service: Cardiovascular;  Laterality: N/A;   CHOLECYSTECTOMY     CHOLECYSTECTOMY, LAPAROSCOPIC  07/24/2010   ESOPHAGEAL DILATION     IR CT HEAD LTD  05/12/2020   IR PERCUTANEOUS ART THROMBECTOMY/INFUSION INTRACRANIAL INC DIAG ANGIO  05/12/2020       IR PERCUTANEOUS ART THROMBECTOMY/INFUSION INTRACRANIAL INC DIAG ANGIO  05/12/2020   IR US GUIDE VASC ACCESS RIGHT  05/12/2020   RADIOLOGY WITH ANESTHESIA N/A 05/12/2020   Procedure: IR WITH ANESTHESIA;  Surgeon: Radiologist, Medication,  MD;  Location: [place];  Service: Radiology;  Laterality: N/A;   TEE WITHOUT CARDIOVERSION N/A 07/31/2019   Procedure: TRANSESOPHAGEAL ECHOCARDIOGRAM (TEE);  Surgeon: Pixie Casino, MD;  Location: [facility] ENDOSCOPY;  Service: Cardiovascular;  Laterality: N/A;   TEE WITHOUT CARDIOVERSION N/A 05/19/2020   Procedure: TRANSESOPHAGEAL ECHOCARDIOGRAM (TEE);  Surgeon: Jolaine Artist, MD;  Location: [facility] ENDOSCOPY;  Service: Cardiovascular;  Laterality: N/A;    FAMILY HISTORY: The patient family history includes Alcohol abuse in an other family member; Diabetes in her sister, sister and another family member; Hypertension in her sister; Multiple sclerosis in her sister; Other in her father; Stroke in her mother.  SOCIAL HISTORY:  The patient  reports that she has never smoked. She has never used smokeless tobacco. She reports that she does not drink alcohol and does not use drugs.  REVIEW OF SYSTEMS: Review of Systems  Constitutional: Negative for chills, fever and malaise/fatigue.  HENT:  Negative for hoarse voice and nosebleeds.   Eyes:  Negative for discharge, double vision and pain.  Cardiovascular:  Negative for chest pain, claudication, dyspnea on exertion, leg swelling, near-syncope, orthopnea, palpitations, paroxysmal nocturnal dyspnea and syncope.  Respiratory:  Negative for hemoptysis and shortness of breath.   Musculoskeletal:  Negative for muscle cramps and myalgias.  Gastrointestinal:  Negative for abdominal pain, constipation, diarrhea, hematemesis, hematochezia, melena, nausea and vomiting.  Neurological:  Negative for dizziness and light-headedness.   PHYSICAL EXAM: Vitals with BMI 10/13/2020 09/03/2020 08/28/2020  Height $Remov'5\' 3"'dbbcbp$  $Remove'5\' 3"'AfoppvG$  $RemoveB'5\' 3"'NNOIxrkq$   Weight 168 lbs 167 lbs 6 oz 167 lbs  BMI 29.77 16.01 09.32  Systolic 355 732 202  Diastolic 88 60 64  Pulse 69 75 78    CONSTITUTIONAL: Well-developed and well-nourished. No acute distress.  SKIN: Skin is warm and dry. No rash noted. No  cyanosis. No pallor. No jaundice HEAD: Normocephalic and atraumatic.  EYES: No scleral icterus MOUTH/THROAT: Moist oral membranes.  NECK: No JVD present. No thyromegaly noted. No carotid bruits  LYMPHATIC: No visible cervical adenopathy.  CHEST Normal respiratory effort. No intercostal retractions  LUNGS: Clear to auscultation bilaterally.  No stridor. No wheezes. No rales.  CARDIOVASCULAR: Regular, positive R4-Y7, soft holosystolic murmur heard at the left lower sternal border, no gallops or rubs. ABDOMINAL: Soft, nontender, nondistended, positive bowel sounds in all 4 quadrants, no apparent ascites.  EXTREMITIES: No peripheral edema, warm to touch bilaterally, 2+ bilateral posterior tibial pulses. HEMATOLOGIC: No significant bruising NEUROLOGIC: Oriented to person, place, and time. Nonfocal. Normal muscle tone.  PSYCHIATRIC: Normal mood and affect. Normal behavior. Cooperative  CARDIAC DATABASE: EKG: 08/28/2020: Normal sinus rhythm, 74 bpm, diffuse ST-T changes cannot rule out underlying ischemia.  Similar findings on prior EKG  dated 06/07/2020.  Echocardiogram: 05/12/20:   1. Left ventricular ejection fraction, by estimation, is <20%. The left  ventricle has severely decreased function. The left ventricle demonstrates  global hypokinesis. There is severe concentric left ventricular  hypertrophy. Diastolic function is indeterminant due to atrial fibrillation. No LV thrombus visualized,  however, apex incompletely visualized due to lack of definity contrast.   2. Right ventricular systolic function is severely reduced. The right  ventricular size is mildly enlarged. There is mildly elevated pulmonary  artery systolic pressure. The estimated right ventricular systolic  pressure is 28.4 mmHg.   3. A large, circumferential pericardial effusion is present. The effusion  appears to be greatest around the posterolateral LV measuring up to 2.2cm  at end-diastole. There is no RV or RA collapse  to suggest tamponade. IVC  is dilated and not collapsible but patient is on the vent with significant TR, which are likely the  primary drivers of IVC dilation.   4. The mitral valve is normal in structure. Mild mitral valve regurgitation.   5. Tricuspid valve regurgitation is moderate to severe.   6. The aortic valve is tricuspid. There is mild calcification of the  aortic valve. There is mild thickening of the aortic valve. Aortic valve  regurgitation is trivial. Mild aortic valve sclerosis is present, with no  evidence of aortic valve stenosis.   7. Left atrial size was mildly dilated.   09/30/2020: 1. Normal LV systolic function with visual EF 55-60%. Left ventricle cavity is normal in size. Moderate left ventricular hypertrophy. Normal global wall motion.Doppler evidence of grade III (restrictive) diastolic dysfunction, elevated LAP. 2. Left atrial cavity is mildly dilated. 3. Mild (Grade I) mitral regurgitation. 4. Mild tricuspid regurgitation. No evidence of pulmonary hypertension. RVSP measures 33 mmHg. 5. Mild pulmonic regurgitation. 6. Small pericardial effusion, located posteriorly, without hemodynamic significance. 7. IVC is normal with a respiratory response of <50%. 8. Compared to study 05/12/2020 LVEF improved from <20% to 55-60%, RV function has improved, pulmonary hypertension has resolved prior RVSP 40.96mmHg and now 12mmHg, pericardial effusion was large and now small in size, Moderate / severe TR is now mild. Otherwise, no significant change.   LABORATORY DATA: CBC Latest Ref Rng & Units 07/23/2020 06/20/2020 06/19/2020  WBC 3.4 - 10.8 x10E3/uL 6.8 - 7.1  Hemoglobin 11.1 - 15.9 g/dL 13.0 9.9(L) 9.9(L)  Hematocrit 34.0 - 46.6 % 43.7 32.9(L) 32.4(L)  Platelets 150 - 450 x10E3/uL 291 - 491(H)    CMP Latest Ref Rng & Units 09/10/2020 07/23/2020 06/20/2020  Glucose 65 - 99 mg/dL 198(H) 214(H) 105(H)  BUN 8 - 27 mg/dL $Remove'10 13 8  'dHBBdFz$ Creatinine 0.57 - 1.00 mg/dL 0.82 0.78 0.80   Sodium 134 - 144 mmol/L 142 137 134(L)  Potassium 3.5 - 5.2 mmol/L 4.3 4.3 4.8  Chloride 96 - 106 mmol/L 100 97 100  CO2 20 - 29 mmol/L 28 18(L) 27  Calcium 8.7 - 10.3 mg/dL 9.7 9.8 8.6(L)  Total Protein 6.0 - 8.5 g/dL - 7.8 -  Total Bilirubin 0.0 - 1.2 mg/dL - <0.2 -  Alkaline Phos 44 - 121 IU/L - 89 -  AST 0 - 40 IU/L - 37 -  ALT 0 - 32 IU/L - 25 -    Lipid Panel  Lab Results  Component Value Date   CHOL 87 05/13/2020   HDL 17 (L) 05/13/2020   LDLCALC 61 05/13/2020   TRIG 46 05/13/2020   CHOLHDL 5.1 05/13/2020     No components found for:  NTPROBNP No results for input(s): PROBNP in the last 8760 hours. Recent Labs    02/04/20 0657 05/13/20 0527 09/10/20 1543  TSH 2.436 1.420 3.080    BMP Recent Labs    11/09/19 1201 02/03/20 1445 03/03/20 1559 03/05/20 1524 03/18/20 1645 06/16/20 0504 06/19/20 0613 06/20/20 0510 07/23/20 1556 09/10/20 1543  NA 145*   < > 138 139   < > 135 134* 134* 137 142  K 4.5   < > 5.1 4.8   < > 4.3 4.4 4.8 4.3 4.3  CL 105   < > 104 102   < > 102 102 100 97 100  CO2 27   < > 21 24   < > $R'29 26 27 'LB$ 18* 28  GLUCOSE 139*   < > 161* 197*   < > 92 90 105* 214* 198*  BUN 13   < > 16 17   < > 11 7* $Rem'8 13 10  'ZWUM$ CREATININE 0.80   < > 0.96 1.08*   < > 0.46 0.64 0.80 0.78 0.82  CALCIUM 9.6   < > 9.1 8.9   < > 8.1* 8.5* 8.6* 9.8 9.7  GFRNONAA 77   < > 61 53*   < > >60 >60 >60  --   --   GFRAA 88  --  70 61  --   --   --   --   --   --    < > = values in this interval not displayed.    HEMOGLOBIN A1C Lab Results  Component Value Date   HGBA1C 7.6 (A) 07/23/2020   MPG 254.65 05/13/2020    IMPRESSION:    ICD-10-CM   1. Chronic heart failure with preserved ejection fraction (HFpEF) (HCC)  I50.32     2. Paroxysmal A-fib (HCC)  I48.0     3. History of cardioversion  Z98.890     4. Long term (current) use of anticoagulants  Z79.01     5. Benign hypertension  I10     6. Mixed hyperlipidemia  E78.2     7. Type 2 diabetes mellitus with  other circulatory complication, with long-term current use of insulin (HCC)  E11.59    Z79.4     8. Long-term insulin use (HCC)  Z79.4     9. Hx of embolic stroke  S96.28        RECOMMENDATIONS: [name] [name] is a 70 y.o. female whose past medical history and cardiac risk factors include: Recovered cardiomyopathy (prior LVEF less than 20%), chronic HFpEF, paroxysmal atrial fibrillation s/p TEE/cardioversion on oral anticoagulation, large pericardial effusion, hypertension, hyperlipidemia, insulin-dependent diabetes mellitus type 2, history of embolic stroke requiring tPA and mechanical thrombectomy, history of cardiogenic shock/PEA cardiac arrest immediately following thrombectomy, large left multi loculated extensive pleural effusion, advanced age, postmenopausal female.  Chronic HFpEF (heart failure with preserved ejection fraction) ([place]) Echo May 2022, LVEF less than 20%. Echocardiogram September 2022, LVEF 55-60% with grade 3 diastolic impairment  Had a long discussion with both the patient and her daughter that her LVEF has improved compared to May 2022; however, she continues to have heart failure with preserved EF with grade 3 diastolic impairment and elevated left atrial pressure.  I would still recommend up titration of GDMT to the maximally tolerated doses. However, patient is unaware of exactly what medication she takes and there are no medication bottles available to review during today's encounter.  For safety reasons I will not uptitrate her GDMT and she  is encouraged to bring her bottles or an accurate medication list at the next visit.    Paroxysmal A-fib (HCC) Rate control: Digoxin. Rhythm control: Amiodarone Thromboembolic prophylaxis: Eliquis CHA2DS2-VASc SCORE is 7 which correlates to 9.6 % risk of stroke per year (CHF, hypertension, age, diabetes, prior stroke, female) History of TEE guided cardioversion 05/19/2020 Patient and daughter are made aware that amiodarone is  off label use for management of paroxysmal atrial fibrillation.  It has a potential to affect multiple organs and therefore she will need follow-up evaluation with regards to thyroid function, liver function, ophthalmological evaluation, PFTs/chest x-ray. Most recent labs reviewed. Last TSH checked August 2022  Long term (current) use of anticoagulants Indication: Paroxysmal atrial fibrillation Reiterated the risks, benefits, and alternatives to oral anticoagulation.  Both patient and daughter are very of the complications of being on oral anticoagulation which has a potential to increase morbidity mortality.  They are aware that if she has an injury despite the mechanism she should be evaluated by going to the closest ER via EMS to rule out intracranial and/or GI bleed.  Benign hypertension Within acceptable range  Medications reconciled. Asked to keep a log of her blood pressures and to bring it in at the next office visit. Low-salt diet recommended  Mixed hyperlipidemia Continue atorvastatin 80 mg p.o. nightly. Most recent lipid profile notes an LDL <70 mg/dL Does not endorse myalgias. Continue to monitor  Type 2 diabetes mellitus with other circulatory complication, with long-term current use of insulin ([place]) Educated on the importance of glycemic control. Most recent hemoglobin A1c 7.6% Currently managed per primary team.  Hx of embolic stroke Status post tPA and mechanical thrombectomy Educated on the importance of secondary prevention.  FINAL MEDICATION LIST END OF ENCOUNTER: No orders of the defined types were placed in this encounter.   Current Outpatient Medications:    acetaminophen (TYLENOL) 325 MG tablet, Take 750 mg by mouth 2 (two) times daily., Disp: , Rfl:    amiodarone (PACERONE) 200 MG tablet, Take 1 tablet (200 mg total) by mouth daily., Disp: 30 tablet, Rfl: 1   amLODipine (NORVASC) 5 MG tablet, TAKE 1 TABLET(5 MG) BY MOUTH DAILY, Disp: 90 tablet, Rfl: 1    apixaban (ELIQUIS) 5 MG TABS tablet, TAKE 1 TABLET(5 MG) BY MOUTH TWICE DAILY, Disp: 180 tablet, Rfl: 1   atorvastatin (LIPITOR) 80 MG tablet, Take 1 tablet (80 mg total) by mouth daily., Disp: 90 tablet, Rfl: 1   B-D UF III MINI PEN NEEDLES 31G X 5 MM MISC, USE WITH LANTUS PEN DAILY AS DIRECTED, Disp: 100 each, Rfl: 1   bisacodyl (DULCOLAX) 10 MG suppository, Place 1 suppository (10 mg total) rectally daily as needed for moderate constipation., Disp: 12 suppository, Rfl: 0   Blood Glucose Monitoring Suppl (ONETOUCH VERIO) w/Device KIT, Please use to check blood sugar up to three times daily. E11.65, Disp: 1 kit, Rfl: 0   Blood Pressure KIT, Monitor blood pressure twice a day, Disp: 1 kit, Rfl: 0   digoxin (LANOXIN) 0.125 MG tablet, TAKE 1 TABLET(0.125 MG) BY MOUTH DAILY, Disp: 90 tablet, Rfl: 1   ferrous gluconate (FERGON) 324 MG tablet, Take 1 tablet (324 mg total) by mouth daily., Disp: 90 tablet, Rfl: 2   glucose blood (ONETOUCH VERIO) test strip, Please use to check blood sugar up to three times daily. E11.65, Disp: 100 each, Rfl: 12   insulin glargine (LANTUS SOLOSTAR) 100 UNIT/ML Solostar Pen, ADMINISTER 18 UNITS UNDER THE SKIN DAILY, Disp: 15 mL,  Rfl: 0   isosorbide-hydrALAZINE (BIDIL) 20-37.5 MG tablet, Take 2 tablets by mouth 3 (three) times daily., Disp: 180 tablet, Rfl: 0   Lancet Devices (ONE TOUCH DELICA LANCING DEV) MISC, Please use to check blood sugar up to three times daily. E11.65, Disp: 1 each, Rfl: 0   lidocaine (LIDODERM) 5 %, Place 1 patch onto the skin daily. Remove & Discard patch within 12 hours or as directed by MD, Disp: 30 patch, Rfl: 0   OneTouch Delica Lancets 12Q MISC, Please use to check blood sugar up to three times daily. E11.65, Disp: 100 each, Rfl: 12   pantoprazole (PROTONIX) 40 MG tablet, Take 1 tablet (40 mg total) by mouth daily., Disp: 90 tablet, Rfl: 0   polyethylene glycol (MIRALAX / GLYCOLAX) 17 g packet, Take 17 g by mouth 2 (two) times daily., Disp: 14  each, Rfl: 0  No orders of the defined types were placed in this encounter.   There are no Patient Instructions on file for this visit.   --Continue cardiac medications as reconciled in final medication list. --Return in about 4 weeks (around 11/10/2020) for Follow up, heart failure management.. Or sooner if needed. --Continue follow-up with your primary care physician regarding the management of your other chronic comorbid conditions.  Patient's questions and concerns were addressed to her satisfaction. She voices understanding of the instructions provided during this encounter.   This note was created using a voice recognition software as a result there may be grammatical errors inadvertently enclosed that do not reflect the nature of this encounter. Every attempt is made to correct such errors.  Rex Kras, [place], [employer] [facility]  Pager: [phone] Office: [phone]

## 2020-10-17 ENCOUNTER — Other Ambulatory Visit: Payer: Self-pay | Admitting: Cardiology

## 2020-10-20 ENCOUNTER — Other Ambulatory Visit: Payer: Self-pay | Admitting: Family Medicine

## 2020-10-22 ENCOUNTER — Other Ambulatory Visit: Payer: Self-pay | Admitting: *Deleted

## 2020-10-22 NOTE — Telephone Encounter (Signed)
error 

## 2020-10-26 ENCOUNTER — Other Ambulatory Visit: Payer: Self-pay | Admitting: Family Medicine

## 2020-10-30 ENCOUNTER — Other Ambulatory Visit: Payer: Self-pay | Admitting: Family Medicine

## 2020-11-05 ENCOUNTER — Ambulatory Visit (INDEPENDENT_AMBULATORY_CARE_PROVIDER_SITE_OTHER): Payer: Medicare Other | Admitting: Pharmacist

## 2020-11-05 ENCOUNTER — Other Ambulatory Visit: Payer: Self-pay

## 2020-11-05 DIAGNOSIS — E119 Type 2 diabetes mellitus without complications: Secondary | ICD-10-CM | POA: Diagnosis not present

## 2020-11-05 MED ORDER — OZEMPIC (0.25 OR 0.5 MG/DOSE) 2 MG/1.5ML ~~LOC~~ SOPN: 0.2500 mg | PEN_INJECTOR | SUBCUTANEOUS | 0 refills | Status: DC

## 2020-11-05 NOTE — Patient Instructions (Signed)
Miss Kukla it was a pleasure seeing you today.   Please do the following:  Take 18 units of Lantus and start Ozempic 0.25mg  once a week as directed today during your appointment. If you have any questions or if you believe something has occurred because of this change, call me or your doctor to let one of [place] know.  Continue checking blood sugars at home. It's really important that you record these and bring these in to your next doctor's appointment.  Continue making the lifestyle changes we've discussed together during our visit. Diet and exercise play a significant role in improving your blood sugars.  Follow-up with me in four weeks.    Hypoglycemia or low blood sugar:   Low blood sugar can happen quickly and may become an emergency if not treated right away.   While this shouldn't happen often, it can be brought upon if you skip a meal or do not eat enough. Also, if your insulin or other diabetes medications are dosed too high, this can cause your blood sugar to go to low.   Warning signs of low blood sugar include: Feeling shaky or dizzy Feeling weak or tired  Excessive hunger Feeling anxious or upset  Sweating even when you aren't exercising  What to do if I experience low blood sugar? Follow the Rule of 15 Check your blood sugar with your meter. If lower than 70, proceed to step 2.  Treat with 15 grams of fast acting carbs which is found in 3-4 glucose tablets. If none are available you can try hard candy, 1 tablespoon of sugar or honey,4 ounces of fruit juice, or 6 ounces of REGULAR soda.  Re-check your sugar in 15 minutes. If it is still below 70, do what you did in step 2 again. If your blood sugar has come back up, go ahead and eat a snack or small meal made up of complex carbs (ex. Whole grains) and protein at this time to avoid recurrence of low blood sugar.

## 2020-11-05 NOTE — Progress Notes (Signed)
Subjective:    Patient ID: [name] [name], female    DOB: 04/23/1950, 70 y.o.   MRN: [id number]  HPI Patient is a 70 y.o. female who presents for diabetes management. She is in good spirits and presents with assistance of walker. Patient was referred and last seen by Primary Care Provider on 09/03/20. Last seen in pharmacy clinic on 10/09/20.  Patient reports she was unable to pick up Trulicity from the pharmacy as they did not have it, but that her blood glucose readings have been great from the Lantus alone.   Patient reports diabetes was diagnosed around 17 years ago.   Insurance coverage/medication affordability: Medicare/Medicaid, no Part D  Current diabetes medications include: Lantus 20 units every night Current hypertension medications include: amlodipine 5mg , isosorbide-hydralazine 20-37.5mg  2 tablets TID Current hyperlipidemia medications include: atorvastatin 80mg  Patient states that She is taking her medications as prescribed. Patient reports adherence with medications.    Do you feel that your medications are working for you?  yes  Have you been experiencing any side effects to the medications prescribed? No   Do you have any problems obtaining medications due to transportation or finances?  no    Patient reports that she loves diet coke without caffeine and drinks water. She has been eating more fruits and veggies.  Patient reports hypoglycemic event of 49 which she states was due to lack of eating. She appropriately treated and monitored improvement in blood glucose. Patient denies polyuria (increased urination). Patient denies polyphagia (increased appetite). Patient denies polydipsia (increased thirst).  Patient denies neuropathy (nerve pain) Patient denies visual changes. Patient reports self foot exams.  Mid to high 100's; none in the 200's  Objective:   Labs:   Physical Exam Neurological:     Mental Status: She is alert and oriented to person, place,  and time.    Review of Systems  Gastrointestinal:  Negative for nausea and vomiting.   Lab Results  Component Value Date   HGBA1C 7.6 (A) 07/23/2020   HGBA1C 10.5 (H) 05/13/2020   HGBA1C 10.4 (H) 02/04/2020    Lab Results  Component Value Date   MICRALBCREAT 30-300 09/06/2017    Lipid Panel     Component Value Date/Time   CHOL 87 05/13/2020 0527   CHOL 132 06/08/2017 1110   TRIG 46 05/13/2020 0527   HDL 17 (L) 05/13/2020 0527   HDL 35 (L) 06/08/2017 1110   CHOLHDL 5.1 05/13/2020 0527   VLDL 9 05/13/2020 0527   LDLCALC 61 05/13/2020 0527   LDLCALC 67 06/08/2017 1110    Clinical Atherosclerotic Cardiovascular Disease (ASCVD): Yes  The ASCVD Risk score (Arnett DK, et al., 2019) failed to calculate for the following reasons:   The patient has a prior MI or stroke diagnosis    Assessment/Plan:   T2DM is mostly controlled likely due to optimal adherence to therapy, however, patient has not presented to visit with glucometer to confirm readings. Ozempic now covered on Medicaid formulary therefore will switch patient back to Ozempic due to difficulties obtaining Trulicity.  Will continue to discuss SGLT2 addition at future visits. Following instruction patient verbalized understanding of treatment plan.    Decreased basal insulin Lantus to 18 units once daily.   Restarted Ozempic 0.25mg  once weekly Extensively discussed pathophysiology of diabetes, dietary effects on blood sugar control, and recommended lifestyle interventions. Counseled on s/sx of and management of hypoglycemia Next A1C anticipated at next office visit  ASCVD risk - secondary prevention in patient with diabetes.  Last LDL is controlled.   Continued atorvastatin 80 mg.   Follow-up appointment four weeks to review sugar readings. Written patient instructions provided.  This appointment required 20 minutes of direct patient care.  Thank you for involving pharmacy to assist in providing this patient's  care.  Patient seen with Ruben Im, PharmD Candidate.

## 2020-11-05 NOTE — Assessment & Plan Note (Signed)
T2DM is mostly controlled likely due to optimal adherence to therapy, however, patient has not presented to visit with glucometer to confirm readings. Ozempic now covered on Medicaid formulary therefore will switch patient back to Ozempic due to difficulties obtaining Trulicity.  Will continue to discuss SGLT2 addition at future visits. Following instruction patient verbalized understanding of treatment plan.    1. Decreased basal insulin Lantus to 18 units once daily.   2. Restarted Ozempic 0.25mg  once weekly 3. Extensively discussed pathophysiology of diabetes, dietary effects on blood sugar control, and recommended lifestyle interventions. 4. Counseled on s/sx of and management of hypoglycemia 5. Next A1C anticipated at next office visit

## 2020-11-16 ENCOUNTER — Other Ambulatory Visit: Payer: Self-pay | Admitting: Family Medicine

## 2020-11-24 ENCOUNTER — Ambulatory Visit: Payer: Medicare Other | Admitting: Cardiology

## 2020-11-24 ENCOUNTER — Other Ambulatory Visit: Payer: Self-pay

## 2020-11-24 ENCOUNTER — Telehealth: Payer: Self-pay

## 2020-11-24 ENCOUNTER — Encounter: Payer: Self-pay | Admitting: Cardiology

## 2020-11-24 VITALS — BP 149/64 | HR 64 | Resp 16 | Ht 63.0 in | Wt 173.2 lb

## 2020-11-24 DIAGNOSIS — Z7901 Long term (current) use of anticoagulants: Secondary | ICD-10-CM

## 2020-11-24 DIAGNOSIS — E1159 Type 2 diabetes mellitus with other circulatory complications: Secondary | ICD-10-CM

## 2020-11-24 DIAGNOSIS — I10 Essential (primary) hypertension: Secondary | ICD-10-CM

## 2020-11-24 DIAGNOSIS — I5032 Chronic diastolic (congestive) heart failure: Secondary | ICD-10-CM

## 2020-11-24 DIAGNOSIS — E782 Mixed hyperlipidemia: Secondary | ICD-10-CM

## 2020-11-24 DIAGNOSIS — I480 Paroxysmal atrial fibrillation: Secondary | ICD-10-CM

## 2020-11-24 DIAGNOSIS — Z9889 Other specified postprocedural states: Secondary | ICD-10-CM

## 2020-11-24 DIAGNOSIS — Z8673 Personal history of transient ischemic attack (TIA), and cerebral infarction without residual deficits: Secondary | ICD-10-CM

## 2020-11-24 DIAGNOSIS — Z794 Long term (current) use of insulin: Secondary | ICD-10-CM

## 2020-11-24 DIAGNOSIS — Z79899 Other long term (current) drug therapy: Secondary | ICD-10-CM

## 2020-11-24 MED ORDER — AMIODARONE HCL 100 MG PO TABS: 100.0000 mg | ORAL_TABLET | Freq: Every day | ORAL | 0 refills | Status: DC

## 2020-11-24 NOTE — Telephone Encounter (Signed)
[facility] called and stated that amiodarone 100 mg is not covered by pts insurance. They wanted to know if we could switch it to 200 mg and have the pt cut it in half.  That is the only way its covered by insurance.

## 2020-11-24 NOTE — Progress Notes (Signed)
Date:  11/24/2020   ID:  [name] [name], DOB 05-07-1950, MRN [id number]  PCP:  Carollee Leitz, MD  Cardiologist:  Rex Kras, DO, [facility] (established care 08/28/2020) Former Cardiology Providers: Dr. Minus Breeding, Dr. Berniece Salines Advanced heart failure specialist: Dr. Haroldine Laws and Dr. Aundra Dubin Cardiac electrophysiologist: Dr. Caryl Comes  Date: 11/24/20 Last Office Visit: 10/13/2020  Chief Complaint  Patient presents with   heart failure management   Follow-up    HPI  [name] [name] is a 70 y.o. female who presents to the office with a chief complaint of " heart failure management and discuss test results." Patient's past medical history and cardiovascular risk factors include: Recovered cardiomyopathy (prior LVEF less than 20% now 55-60% 09/22), chronic HFpEF, paroxysmal atrial fibrillation s/p TEE/cardioversion on oral anticoagulation, large pericardial effusion, hypertension, hyperlipidemia, insulin-dependent diabetes mellitus type 2, history of embolic stroke requiring tPA and mechanical thrombectomy, history of cardiogenic shock/PEA cardiac arrest immediately following thrombectomy, large left multi loculated extensive pleural effusion, advanced age, postmenopausal female.  She is referred to the office at the request of Carollee Leitz, MD for evaluation of establish care given her chronic heart failure.  Patient's daughter Hulen Skains was present via FaceTime during today's encounter.  Patient was initially seen in August 2022 as a second opinion with regards to the management of congestive heart failure.  She was following up with [facility] and was referred to the office to reestablish care.  Please refer to the initial consultation note for additional details.  At last office visit I reconfirmed that she was being treated appropriately given her diagnosis of congestive heart failure and that she is more than welcome to follow-up with [facility]; however, patient chooses to transfer care to  [facility] cardiovascular.  Recent echo notes improvement in LVEF; however, notable for grade 3 diastolic impairment with elevated left atrial pressures.  Medication reconciliation has been difficult at [facility] visit as she does not know what she takes and does not bring in her cardiac medication bottles for review. Per vital signs her weight is 5# up but denies shortness of breath, LE swelling, orthopnea, PND. She says the 5# weight gain is due to clothes and her morning weight was 166#.   In the past spironolactone was discontinued secondary to hyperkalemia, ARB/Arni were discontinued due to history of hyperkalemia, patient has not been tried on SGL 2 inhibitors.   FUNCTIONAL STATUS: No structured exercise program or daily routine.   ALLERGIES: Allergies  Allergen Reactions   Amiodarone Shortness Of Breath and Other (See Comments)    Soreness, wheezing, and weakness also Update:  tolerating Amio PO 06/2020   Fruit & Vegetable Daily [Nutritional Supplements] Shortness Of Breath, Swelling and Other (See Comments)    All fruits cause tongue swelling and numbness Organic fruits are okay to eat   Metoprolol Shortness Of Breath, Other (See Comments) and Cough    Soreness, wheezing, and weakness also   Other Shortness Of Breath, Swelling and Other (See Comments)    Tree nuts cause tongue swelling and numbness   Peanut-Containing Drug Products Shortness Of Breath, Swelling and Other (See Comments)    Tongue swelling and numbness   Aspirin Nausea And Vomiting and Other (See Comments)    Cannot take due to liver issues   Tylenol [Acetaminophen] Other (See Comments)    Cannot take due to liver issues- tolerating 650 mg tablets in 2022   Lactose Intolerance (Gi) Diarrhea and Nausea And Vomiting    MEDICATION LIST PRIOR TO  VISIT: Current Meds  Medication Sig   acetaminophen (TYLENOL) 325 MG tablet Take 750 mg by mouth 2 (two) times daily.   amLODipine (NORVASC) 5 MG tablet TAKE 1 TABLET(5 MG) BY  MOUTH DAILY   apixaban (ELIQUIS) 5 MG TABS tablet TAKE 1 TABLET(5 MG) BY MOUTH TWICE DAILY   atorvastatin (LIPITOR) 80 MG tablet Take 1 tablet (80 mg total) by mouth daily.   B-D UF III MINI PEN NEEDLES 31G X 5 MM MISC INJECT WTH LANTUS PEN DAILY AS DIRECTED   bisacodyl (DULCOLAX) 10 MG suppository Place 1 suppository (10 mg total) rectally daily as needed for moderate constipation.   Blood Glucose Monitoring Suppl (ONETOUCH VERIO) w/Device KIT Please use to check blood sugar up to three times daily. E11.65   Blood Pressure KIT Monitor blood pressure twice a day   digoxin (LANOXIN) 0.125 MG tablet TAKE 1 TABLET(0.125 MG) BY MOUTH DAILY   glucose blood (ONETOUCH VERIO) test strip Please use to check blood sugar up to three times daily. E11.65   insulin glargine (LANTUS SOLOSTAR) 100 UNIT/ML Solostar Pen ADMINISTER 18 UNITS UNDER THE SKIN DAILY   isosorbide-hydrALAZINE (BIDIL) 20-37.5 MG tablet TAKE 2 TABLETS BY MOUTH THREE TIMES DAILY   Lancet Devices (ONE TOUCH DELICA LANCING DEV) MISC Please use to check blood sugar up to three times daily. E11.65   lidocaine (LIDODERM) 5 % Place 1 patch onto the skin daily. Remove & Discard patch within 12 hours or as directed by MD   OneTouch Delica Lancets 84K MISC Please use to check blood sugar up to three times daily. E11.65   pantoprazole (PROTONIX) 40 MG tablet TAKE 1 TABLET(40 MG) BY MOUTH DAILY   [DISCONTINUED] amiodarone (PACERONE) 200 MG tablet Take 1 tablet (200 mg total) by mouth daily.     PAST MEDICAL HISTORY: Past Medical History:  Diagnosis Date   Abdominal pain    Acute respiratory failure with hypoxia (HCC)    Atrial flutter (HCC)    Atrial tachycardia (HCC) 07/29/2019   Back pain    Chronic anticoagulation - on Eliquis for chronic afib 02/04/2020   Chronic combined systolic and diastolic CHF (congestive heart failure) ([place]) 02/04/2020   Chronic heart failure with preserved ejection fraction (HFpEF) ([place]) 03/18/2020   LVEF less than  20% (May 2022), LVEF 55 to 60% (September 2022)   Constipation    COVID-19 02/04/2020   Diabetes mellitus without complication ([place])    Dysphagia 02/03/2020   Epigastric pain 02/03/2020   Fatigue    Full dentures    Hemorrhoids    History of esophageal stricture 07/29/2019   History of noncompliance with medical treatment, presenting hazards to health    Hyperlipidemia    Hypertension    Lack of adequate sleep    Lack of adequate sleep    Liver mass    Nonrheumatic tricuspid valve regurgitation 03/18/2020   Oral thrush 02/04/2020   PAF (paroxysmal atrial fibrillation) (HCC)    Pericardial effusion 05/12/2010   Pleural effusion 06/07/2020   Pleural effusion on left    Rash    on right breast   Stroke (cerebrum) ([place]) 05/12/2020   Uncontrolled type 2 diabetes mellitus with hyperglycemia ([place]) 06/08/2017   Wears glasses     PAST SURGICAL HISTORY: Past Surgical History:  Procedure Laterality Date   BREAST DUCTAL SYSTEM EXCISION     BREAST EXCISIONAL BIOPSY Left    CARDIOVERSION N/A 07/31/2019   Procedure: CARDIOVERSION;  Surgeon: Pixie Casino, MD;  Location: [facility] ENDOSCOPY;  Service: Cardiovascular;  Laterality: N/A;   CARDIOVERSION N/A 05/19/2020   Procedure: CARDIOVERSION;  Surgeon: Jolaine Artist, MD;  Location: [place];  Service: Cardiovascular;  Laterality: N/A;   CHOLECYSTECTOMY     CHOLECYSTECTOMY, LAPAROSCOPIC  07/24/2010   ESOPHAGEAL DILATION     IR CT HEAD LTD  05/12/2020   IR PERCUTANEOUS ART THROMBECTOMY/INFUSION INTRACRANIAL INC DIAG ANGIO  05/12/2020       IR PERCUTANEOUS ART THROMBECTOMY/INFUSION INTRACRANIAL INC DIAG ANGIO  05/12/2020   IR US GUIDE VASC ACCESS RIGHT  05/12/2020   RADIOLOGY WITH ANESTHESIA N/A 05/12/2020   Procedure: IR WITH ANESTHESIA;  Surgeon: Radiologist, Medication, MD;  Location: [place];  Service: Radiology;  Laterality: N/A;   TEE WITHOUT CARDIOVERSION N/A 07/31/2019   Procedure: TRANSESOPHAGEAL ECHOCARDIOGRAM (TEE);  Surgeon: Pixie Casino, MD;  Location: [facility] ENDOSCOPY;  Service: Cardiovascular;  Laterality: N/A;   TEE WITHOUT CARDIOVERSION N/A 05/19/2020   Procedure: TRANSESOPHAGEAL ECHOCARDIOGRAM (TEE);  Surgeon: Jolaine Artist, MD;  Location: [facility] ENDOSCOPY;  Service: Cardiovascular;  Laterality: N/A;    FAMILY HISTORY: The patient family history includes Alcohol abuse in an other family member; Diabetes in her sister, sister and another family member; Hypertension in her sister; Multiple sclerosis in her sister; Other in her father; Stroke in her mother.  SOCIAL HISTORY:  The patient  reports that she has never smoked. She has never used smokeless tobacco. She reports that she does not drink alcohol and does not use drugs.  REVIEW OF SYSTEMS: Review of Systems  Constitutional: Negative for chills, fever and malaise/fatigue.  HENT:  Negative for hoarse voice and nosebleeds.   Eyes:  Negative for discharge, double vision and pain.  Cardiovascular:  Negative for chest pain, claudication, dyspnea on exertion, leg swelling, near-syncope, orthopnea, palpitations, paroxysmal nocturnal dyspnea and syncope.  Respiratory:  Negative for hemoptysis and shortness of breath.   Musculoskeletal:  Negative for muscle cramps and myalgias.  Gastrointestinal:  Negative for abdominal pain, constipation, diarrhea, hematemesis, hematochezia, melena, nausea and vomiting.  Neurological:  Negative for dizziness and light-headedness.   PHYSICAL EXAM: Vitals with BMI 11/24/2020 10/13/2020 09/03/2020  Height _0  _1  _2   Weight 173 lbs 3 oz 168 lbs 167 lbs 6 oz  BMI 30.69 01.60 10.93  Systolic 235 573 220  Diastolic 64 88 60  Pulse 64 69 75    CONSTITUTIONAL: Well-developed and well-nourished. No acute distress.  SKIN: Skin is warm and dry. No rash noted. No cyanosis. No pallor. No jaundice HEAD: Normocephalic and atraumatic.  EYES: No scleral icterus MOUTH/THROAT: Moist oral membranes.  NECK: No JVD present. No thyromegaly  noted. No carotid bruits  LYMPHATIC: No visible cervical adenopathy.  CHEST Normal respiratory effort. No intercostal retractions  LUNGS: Clear to auscultation bilaterally.  No stridor. No wheezes. No rales.  CARDIOVASCULAR: Regular, positive U5-K2, soft holosystolic murmur heard at the left lower sternal border, no gallops or rubs. ABDOMINAL: Soft, nontender, nondistended, positive bowel sounds in all 4 quadrants, no apparent ascites.  EXTREMITIES: No peripheral edema, warm to touch bilaterally, 2+ bilateral posterior tibial pulses. HEMATOLOGIC: No significant bruising NEUROLOGIC: Oriented to person, place, and time. Nonfocal. Normal muscle tone.  PSYCHIATRIC: Normal mood and affect. Normal behavior. Cooperative  CARDIAC DATABASE: EKG: 08/28/2020: Normal sinus rhythm, 74 bpm, diffuse ST-T changes cannot rule out underlying ischemia.  Similar findings on prior EKG dated 06/07/2020.  Echocardiogram: 05/12/20:  LVEF <20%, severely reduced LVEF, global hypokinesis, severe LVH, no LV thrombus visualized, diastolic dysfunction is indeterminate  due to atrial fibrillation, RV size mildly dilated, RV systolic function severely reduced RVSP 40.6 mmHg, large circumferential pericardial effusion, IVC dilated and not collapsible, mild MR, moderate to severe TR, trivial AR aortic valve sclerosis without stenosis left atrium mildly dilated.    09/30/2020: 1. Normal LV systolic function with visual EF 55-60%. Left ventricle cavity is normal in size. Moderate left ventricular hypertrophy. Normal global wall motion.Doppler evidence of grade III (restrictive) diastolic dysfunction, elevated LAP. 2. Left atrial cavity is mildly dilated. 3. Mild (Grade I) mitral regurgitation. 4. Mild tricuspid regurgitation. No evidence of pulmonary hypertension. RVSP measures 33 mmHg. 5. Mild pulmonic regurgitation. 6. Small pericardial effusion, located posteriorly, without hemodynamic significance. 7. IVC is normal with a  respiratory response of <50%. 8. Compared to study 05/12/2020 LVEF improved from <20% to 55-60%, RV function has improved, pulmonary hypertension has resolved prior RVSP 40.54mHg and now 370mg, pericardial effusion was large and now small in size, Moderate / severe TR is now mild. Otherwise, no significant change.   LABORATORY DATA: CBC Latest Ref Rng & Units 07/23/2020 06/20/2020 06/19/2020  WBC 3.4 - 10.8 x10E3/uL 6.8 - 7.1  Hemoglobin 11.1 - 15.9 g/dL 13.0 9.9(L) 9.9(L)  Hematocrit 34.0 - 46.6 % 43.7 32.9(L) 32.4(L)  Platelets 150 - 450 x10E3/uL 291 - 491(H)    CMP Latest Ref Rng & Units 09/10/2020 07/23/2020 06/20/2020  Glucose 65 - 99 mg/dL 198(H) 214(H) 105(H)  BUN 8 - 27 mg/dL _0 Creatinine 0.57 - 1.00 mg/dL 0.82 0.78 0.80  Sodium 134 - 144 mmol/L 142 137 134(L)  Potassium 3.5 - 5.2 mmol/L 4.3 4.3 4.8  Chloride 96 - 106 mmol/L 100 97 100  CO2 20 - 29 mmol/L 28 18(L) 27  Calcium 8.7 - 10.3 mg/dL 9.7 9.8 8.6(L)  Total Protein 6.0 - 8.5 g/dL - 7.8 -  Total Bilirubin 0.0 - 1.2 mg/dL - <0.2 -  Alkaline Phos 44 - 121 IU/L - 89 -  AST 0 - 40 IU/L - 37 -  ALT 0 - 32 IU/L - 25 -    Lipid Panel  Lab Results  Component Value Date   CHOL 87 05/13/2020   HDL 17 (L) 05/13/2020   LDLCALC 61 05/13/2020   TRIG 46 05/13/2020   CHOLHDL 5.1 05/13/2020     No components found for: NTPROBNP No results for input(s): PROBNP in the last 8760 hours. Recent Labs    02/04/20 0657 05/13/20 0527 09/10/20 1543  TSH 2.436 1.420 3.080    BMP Recent Labs    03/03/20 1559 03/05/20 1524 03/18/20 1645 06/16/20 0504 06/19/20 0613 06/20/20 0510 07/23/20 1556 09/10/20 1543  NA 138 139   < > 135 134* 134* 137 142  K 5.1 4.8   < > 4.3 4.4 4.8 4.3 4.3  CL 104 102   < > 102 102 100 97 100  CO2 21 24   < > _1 18* 28  GLUCOSE 161* 197*   < > 92 90 105* 214* 198*  BUN 16 17   < > 11 7* _2 CREATININE 0.96 1.08*   < > 0.46 0.64 0.80 0.78 0.82  CALCIUM 9.1 8.9   < > 8.1* 8.5* 8.6*  9.8 9.7  GFRNONAA 61 53*   < > >60 >60 >60  --   --   GFRAA 70 61  --   --   --   --   --   --    < > =  values in this interval not displayed.    HEMOGLOBIN A1C Lab Results  Component Value Date   HGBA1C 7.6 (A) 07/23/2020   MPG 254.65 05/13/2020    IMPRESSION:    ICD-10-CM   1. Chronic heart failure with preserved ejection fraction (HFpEF) (HCC)  I50.32 CMP14+EGFR    Pro b natriuretic peptide (BNP)    2. Paroxysmal A-fib (HCC)  I48.0 Digoxin level    3. History of cardioversion  Z98.890     4. Long term (current) use of anticoagulants  Z79.01     5. Long term current use of antiarrhythmic drug  Z79.899 amiodarone (PACERONE) 100 MG tablet    DG Chest Portable 2 Views    Pulmonary function test    6. Benign hypertension  I10     7. Mixed hyperlipidemia  E78.2     8. Type 2 diabetes mellitus with other circulatory complication, with long-term current use of insulin (HCC)  E11.59    Z79.4     9. Long-term insulin use (HCC)  Z79.4     10. Hx of TIA (transient ischemic attack) and stroke  Z86.73        RECOMMENDATIONS: [name] [name] is a 70 y.o. female whose past medical history and cardiac risk factors include: Recovered cardiomyopathy (prior LVEF less than 20%), chronic HFpEF, paroxysmal atrial fibrillation s/p TEE/cardioversion on oral anticoagulation, large pericardial effusion, hypertension, hyperlipidemia, insulin-dependent diabetes mellitus type 2, history of embolic stroke requiring tPA and mechanical thrombectomy, history of cardiogenic shock/PEA cardiac arrest immediately following thrombectomy, large left multi loculated extensive pleural effusion, advanced age, postmenopausal female.  Chronic HFpEF (heart failure with preserved ejection fraction) ([place]) Echo May 2022 LVEF less than 20%, followed by 09/2020  LVEF 55-60% with grade 3 diastolic impairment  Difficult to uptitrate GDMT as the patient does not recall exactly what medication she is taking and she has  not brought in her medication bottles for reference despite multiple office visits. Based on EMR patient has been intolerant to spironolactone due to hyperkalemia, ARB/Arni was discontinued due to hyperkalemia, and SGLT2 inhibitors were not attempted. Spoke to the patient's daughter Jamas Lav over the phone and highly encouraged her to bring her medication bottles along with her weight and blood pressure log to the office visit.  And if possible to have her or her sister accompanied the patient as well. Check labs prior to the next office visit so medications can be further uptitrated  Paroxysmal A-fib ([place]) Rate control: Digoxin. Rhythm control: Amiodarone Thromboembolic prophylaxis: Eliquis CHA2DS2-VASc SCORE is 7 which correlates to 9.6 % risk of stroke per year (CHF, hypertension, age, diabetes, prior stroke, female) History of TEE guided cardioversion 05/19/2020 Patient and daughter are made aware that amiodarone is off label use for management of paroxysmal atrial fibrillation.  It has a potential to affect multiple organs and therefore she will need follow-up evaluation with regards to thyroid function, liver function, ophthalmological evaluation, PFTs/chest x-ray. Check digoxin levels.  Long-term antiarrhythmic medications: Will reduce the dose of amiodarone 200 mg p.o. daily TSH within normal limits (08/2020), LFTs within normal limits (07/2020),  Will check PFTs and chest x-ray Patient and her daughter are informed of undergoing an eye exam as well.  Long term (current) use of anticoagulants Indication: Paroxysmal atrial fibrillation Reiterated the risks, benefits, and alternatives to oral anticoagulation.  Both patient and daughter are very of the complications of being on oral anticoagulation which has a potential to increase morbidity mortality.  They are aware that if  she has an injury despite the mechanism she should be evaluated by going to the closest ER via EMS to rule out  intracranial and/or GI bleed.  Benign hypertension Within acceptable range  Medications reconciled. Asked to keep a log of her blood pressures and to bring it in at the next office visit. Low-salt diet recommended  Mixed hyperlipidemia Continue atorvastatin 80 mg p.o. nightly. Most recent lipid profile notes an LDL <70 mg/dL Does not endorse myalgias. Continue to monitor  Type 2 diabetes mellitus with other circulatory complication, with long-term current use of insulin ([place]) Educated on the importance of glycemic control. Most recent hemoglobin A1c 7.6% Currently managed per primary team.  Hx of embolic stroke Status post tPA and mechanical thrombectomy Educated on the importance of secondary prevention.  Patient has poor insight on her chronic comorbid conditions and therefore medication titration has been difficult despite being seen at multiple office visits.  Again I reached out to the patient's daughter Jamas Lav during the encounter to discuss HPI, disease management, and the importance of following through.  FINAL MEDICATION LIST END OF ENCOUNTER: Meds ordered this encounter  Medications   amiodarone (PACERONE) 100 MG tablet    Sig: Take 1 tablet (100 mg total) by mouth daily.    Dispense:  90 tablet    Refill:  0     Current Outpatient Medications:    acetaminophen (TYLENOL) 325 MG tablet, Take 750 mg by mouth 2 (two) times daily., Disp: , Rfl:    amLODipine (NORVASC) 5 MG tablet, TAKE 1 TABLET(5 MG) BY MOUTH DAILY, Disp: 90 tablet, Rfl: 1   apixaban (ELIQUIS) 5 MG TABS tablet, TAKE 1 TABLET(5 MG) BY MOUTH TWICE DAILY, Disp: 180 tablet, Rfl: 1   atorvastatin (LIPITOR) 80 MG tablet, Take 1 tablet (80 mg total) by mouth daily., Disp: 90 tablet, Rfl: 1   B-D UF III MINI PEN NEEDLES 31G X 5 MM MISC, INJECT WTH LANTUS PEN DAILY AS DIRECTED, Disp: 100 each, Rfl: 1   bisacodyl (DULCOLAX) 10 MG suppository, Place 1 suppository (10 mg total) rectally daily as needed for moderate  constipation., Disp: 12 suppository, Rfl: 0   Blood Glucose Monitoring Suppl (ONETOUCH VERIO) w/Device KIT, Please use to check blood sugar up to three times daily. E11.65, Disp: 1 kit, Rfl: 0   Blood Pressure KIT, Monitor blood pressure twice a day, Disp: 1 kit, Rfl: 0   digoxin (LANOXIN) 0.125 MG tablet, TAKE 1 TABLET(0.125 MG) BY MOUTH DAILY, Disp: 90 tablet, Rfl: 1   glucose blood (ONETOUCH VERIO) test strip, Please use to check blood sugar up to three times daily. E11.65, Disp: 100 each, Rfl: 12   insulin glargine (LANTUS SOLOSTAR) 100 UNIT/ML Solostar Pen, ADMINISTER 18 UNITS UNDER THE SKIN DAILY, Disp: 15 mL, Rfl: 0   isosorbide-hydrALAZINE (BIDIL) 20-37.5 MG tablet, TAKE 2 TABLETS BY MOUTH THREE TIMES DAILY, Disp: 180 tablet, Rfl: 0   Lancet Devices (ONE TOUCH DELICA LANCING DEV) MISC, Please use to check blood sugar up to three times daily. E11.65, Disp: 1 each, Rfl: 0   lidocaine (LIDODERM) 5 %, Place 1 patch onto the skin daily. Remove & Discard patch within 12 hours or as directed by MD, Disp: 30 patch, Rfl: 0   OneTouch Delica Lancets 61Q MISC, Please use to check blood sugar up to three times daily. E11.65, Disp: 100 each, Rfl: 12   pantoprazole (PROTONIX) 40 MG tablet, TAKE 1 TABLET(40 MG) BY MOUTH DAILY, Disp: 90 tablet, Rfl: 0   amiodarone (  PACERONE) 100 MG tablet, Take 1 tablet (100 mg total) by mouth daily., Disp: 90 tablet, Rfl: 0  Orders Placed This Encounter  Procedures   DG Chest Portable 2 Views   CMP14+EGFR   Pro b natriuretic peptide (BNP)   Digoxin level   Pulmonary function test     There are no Patient Instructions on file for this visit.   --Continue cardiac medications as reconciled in final medication list. --Return in about 5 weeks (around 12/29/2020) for Follow up, heart failure management, review CXR, PFTs, and Lab. Or sooner if needed. --Continue follow-up with your primary care physician regarding the management of your other chronic comorbid  conditions.  Patient's questions and concerns were addressed to her satisfaction. She voices understanding of the instructions provided during this encounter.   This note was created using a voice recognition software as a result there may be grammatical errors inadvertently enclosed that do not reflect the nature of this encounter. Every attempt is made to correct such errors.  Rex Kras, [place], [facility]  Pager: [phone] Office: [phone]

## 2020-11-24 NOTE — Telephone Encounter (Signed)
Okay.   ST

## 2020-11-26 ENCOUNTER — Other Ambulatory Visit: Payer: Self-pay

## 2020-11-26 DIAGNOSIS — Z79899 Other long term (current) drug therapy: Secondary | ICD-10-CM

## 2020-11-26 DIAGNOSIS — I5032 Chronic diastolic (congestive) heart failure: Secondary | ICD-10-CM

## 2020-11-26 MED ORDER — AMIODARONE HCL 200 MG PO TABS: 100.0000 mg | ORAL_TABLET | Freq: Every day | ORAL | 0 refills | Status: DC

## 2020-11-26 NOTE — Telephone Encounter (Signed)
Sent in 200 mg amiodarone for patient to take half a tab (100 mg) once daily.

## 2020-12-06 ENCOUNTER — Other Ambulatory Visit: Payer: Self-pay | Admitting: Family Medicine

## 2020-12-06 DIAGNOSIS — E1165 Type 2 diabetes mellitus with hyperglycemia: Secondary | ICD-10-CM

## 2020-12-10 ENCOUNTER — Other Ambulatory Visit: Payer: Self-pay

## 2020-12-10 ENCOUNTER — Ambulatory Visit (INDEPENDENT_AMBULATORY_CARE_PROVIDER_SITE_OTHER): Payer: Medicare Other | Admitting: Pharmacist

## 2020-12-10 DIAGNOSIS — E119 Type 2 diabetes mellitus without complications: Secondary | ICD-10-CM | POA: Diagnosis not present

## 2020-12-10 DIAGNOSIS — E1165 Type 2 diabetes mellitus with hyperglycemia: Secondary | ICD-10-CM | POA: Diagnosis not present

## 2020-12-10 MED ORDER — OZEMPIC (0.25 OR 0.5 MG/DOSE) 2 MG/1.5ML ~~LOC~~ SOPN
0.2500 mg | PEN_INJECTOR | SUBCUTANEOUS | 0 refills | Status: DC
Start: 2020-12-10 — End: 2020-12-30

## 2020-12-10 MED ORDER — LANTUS SOLOSTAR 100 UNIT/ML ~~LOC~~ SOPN
18.0000 [IU] | PEN_INJECTOR | Freq: Every day | SUBCUTANEOUS | 0 refills | Status: DC
Start: 2020-12-10 — End: 2021-02-05

## 2020-12-10 NOTE — Assessment & Plan Note (Signed)
T2DM is mostly controlled likely due to optimal adherence to therapy, however, patient has never presented to pharmacy visit with glucometer to confirm readings. Will again try to help patient obtain GLP1 as well as CGM. Will re-send in prescription for Ozempic, which patient has previously taken and tolerated well. Patient educated on purpose, proper use and potential adverse effects of Ozempic. Will re-visit discussion of SGLT2 as patient is slightly apprehensive to medication changes. Patient was amendable to decrease Lantus back down from 20 units to 18 units after discussion that it is better for glucose to be slightly higher than it is right now as opposed to frequently on the lower end, such as the 70's. Following instruction patient verbalized understanding of treatment plan.   1. Decreased basal insulin Lantus to 18 units once daily.   2. Sent in prescription for Ozempic 0.25mg  once weekly 3. Extensively discussed pathophysiology of diabetes, dietary effects on blood sugar control, and recommended lifestyle interventions. 4. Counseled on s/sx of and management of hypoglycemia 5. Next A1C anticipated February 2023.

## 2020-12-10 NOTE — Progress Notes (Signed)
Subjective:    Patient ID: [name] [name], female    DOB: 13-Oct-1950, 70 y.o.   MRN: [id number]  HPI Patient is a 70 y.o. female who presents for diabetes management. She is in good spirits and presents with assistance of walker. Patient was referred and last seen by Primary Care Provider on 09/03/20. Last seen in pharmacy clinic on 11/05/20.  Patient still unable to obtain GLP therefore she increased her Lantus back to 20 units from 18 units as we previously discussed.   Patient reports diabetes was diagnosed around 17 years ago.   Insurance coverage/medication affordability: Medicare/Medicaid, no Part D  Current diabetes medications include: Lantus 20 units every night Current hypertension medications include: amlodipine 5mg , isosorbide-hydralazine 20-37.5mg  2 tablets TID Current hyperlipidemia medications include: atorvastatin 80mg  Patient states that She is taking her medications as prescribed. Patient reports adherence with medications.    Do you feel that your medications are working for you?  yes  Have you been experiencing any side effects to the medications prescribed? No   Do you have any problems obtaining medications due to transportation or finances?  no    Patient reports that she loves diet coke without caffeine and drinks water. She has been eating more fruits and veggies.  Patient reports hypoglycemic event of 42 which she states was due to lack of eating. She appropriately treated and monitored improvement in blood glucose. Patient denies polyuria (increased urination). Patient denies polyphagia (increased appetite). Patient denies polydipsia (increased thirst).  Patient denies neuropathy (nerve pain) Patient denies visual changes. Patient reports self foot exams.  Self-reported fasting blood glucose: 70-110 Self-reported post-prandial/random blood glucose: 150-180  Objective:   Labs:   Physical Exam Neurological:     Mental Status: She is alert and  oriented to person, place, and time.    Review of Systems  Gastrointestinal:  Negative for nausea and vomiting.   Lab Results  Component Value Date   HGBA1C 7.6 (A) 07/23/2020   HGBA1C 10.5 (H) 05/13/2020   HGBA1C 10.4 (H) 02/04/2020    Lab Results  Component Value Date   MICRALBCREAT 30-300 09/06/2017    Lipid Panel     Component Value Date/Time   CHOL 87 05/13/2020 0527   CHOL 132 06/08/2017 1110   TRIG 46 05/13/2020 0527   HDL 17 (L) 05/13/2020 0527   HDL 35 (L) 06/08/2017 1110   CHOLHDL 5.1 05/13/2020 0527   VLDL 9 05/13/2020 0527   LDLCALC 61 05/13/2020 0527   LDLCALC 67 06/08/2017 1110    Clinical Atherosclerotic Cardiovascular Disease (ASCVD): Yes  The ASCVD Risk score (Arnett DK, et al., 2019) failed to calculate for the following reasons:   The patient has a prior MI or stroke diagnosis    Assessment/Plan:   T2DM is mostly controlled likely due to optimal adherence to therapy, however, patient has never presented to pharmacy visit with glucometer to confirm readings. Will again try to help patient obtain GLP1 as well as CGM. Will re-send in prescription for Ozempic, which patient has previously taken and tolerated well. Patient educated on purpose, proper use and potential adverse effects of Ozempic. Will re-visit discussion of SGLT2 as patient is slightly apprehensive to medication changes. Patient was amendable to decrease Lantus back down from 20 units to 18 units after discussion that it is better for glucose to be slightly higher than it is right now as opposed to frequently on the lower end, such as the 70's. Following instruction patient verbalized understanding of treatment  plan.   Decreased basal insulin Lantus to 18 units once daily.   Sent in prescription for Ozempic 0.25mg  once weekly Extensively discussed pathophysiology of diabetes, dietary effects on blood sugar control, and recommended lifestyle interventions. Counseled on s/sx of and management of  hypoglycemia Next A1C anticipated February 2023.  ASCVD risk - secondary prevention in patient with diabetes. Last LDL is controlled.   Continued atorvastatin 80 mg.   Follow-up appointment four weeks to review sugar readings. Written patient instructions provided.  This appointment required 30 minutes of direct patient care.  Thank you for involving pharmacy to assist in providing this patient's care.

## 2020-12-10 NOTE — Patient Instructions (Addendum)
Miss Trapani it was a pleasure seeing you today.   Please do the following:  Decrease your Lantus to 18 units once daily as directed today during your appointment. If you have any questions or if you believe something has occurred because of this change, call me or your doctor to let one of [place] know.  Continue checking blood sugars at home. It's really important that you record these and bring these in to your next doctor's appointment.  Continue making the lifestyle changes we've discussed together during our visit. Diet and exercise play a significant role in improving your blood sugars.  Follow-up with me in five weeks.    Hypoglycemia or low blood sugar:   Low blood sugar can happen quickly and may become an emergency if not treated right away.   While this shouldn't happen often, it can be brought upon if you skip a meal or do not eat enough. Also, if your insulin or other diabetes medications are dosed too high, this can cause your blood sugar to go to low.   Warning signs of low blood sugar include: Feeling shaky or dizzy Feeling weak or tired  Excessive hunger Feeling anxious or upset  Sweating even when you aren't exercising  What to do if I experience low blood sugar? Follow the Rule of 15 Check your blood sugar with your meter. If lower than 70, proceed to step 2.  Treat with 15 grams of fast acting carbs which is found in 3-4 glucose tablets. If none are available you can try hard candy, 1 tablespoon of sugar or honey,4 ounces of fruit juice, or 6 ounces of REGULAR soda.  Re-check your sugar in 15 minutes. If it is still below 70, do what you did in step 2 again. If your blood sugar has come back up, go ahead and eat a snack or small meal made up of complex carbs (ex. Whole grains) and protein at this time to avoid recurrence of low blood sugar.

## 2020-12-11 LAB — CMP14+EGFR
ALT: 30 IU/L (ref 0–32)
AST: 15 IU/L (ref 0–40)
Albumin/Globulin Ratio: 1.4 (ref 1.2–2.2)
Albumin: 4.2 g/dL (ref 3.8–4.8)
Alkaline Phosphatase: 81 IU/L (ref 44–121)
BUN/Creatinine Ratio: 17 (ref 12–28)
BUN: 20 mg/dL (ref 8–27)
Bilirubin Total: 0.2 mg/dL (ref 0.0–1.2)
CO2: 24 mmol/L (ref 20–29)
Calcium: 10.1 mg/dL (ref 8.7–10.3)
Chloride: 99 mmol/L (ref 96–106)
Creatinine, Ser: 1.2 mg/dL — ABNORMAL HIGH (ref 0.57–1.00)
Globulin, Total: 3 g/dL (ref 1.5–4.5)
Glucose: 231 mg/dL — ABNORMAL HIGH (ref 70–99)
Potassium: 5 mmol/L (ref 3.5–5.2)
Sodium: 139 mmol/L (ref 134–144)
Total Protein: 7.2 g/dL (ref 6.0–8.5)
eGFR: 49 mL/min/{1.73_m2} — ABNORMAL LOW (ref >59)

## 2020-12-11 LAB — PRO B NATRIURETIC PEPTIDE: NT-Pro BNP: 536 pg/mL — ABNORMAL HIGH (ref 0–301)

## 2020-12-11 LAB — HEMOGLOBIN A1C
Est. average glucose Bld gHb Est-mCnc: 220 mg/dL
Hgb A1c MFr Bld: 9.3 % — ABNORMAL HIGH (ref 4.8–5.6)

## 2020-12-11 LAB — DIGOXIN LEVEL: Digoxin, Serum: 1 ng/mL — ABNORMAL HIGH (ref 0.5–0.9)

## 2020-12-18 ENCOUNTER — Other Ambulatory Visit: Payer: Self-pay | Admitting: Family Medicine

## 2020-12-19 NOTE — Progress Notes (Signed)
Spoke to patient she voiced understanding she will make her PCP aware and patient mention she went to the eye doctor already as you instructed her.

## 2020-12-30 ENCOUNTER — Ambulatory Visit: Payer: Medicare Other | Admitting: Cardiology

## 2020-12-30 ENCOUNTER — Other Ambulatory Visit: Payer: Self-pay

## 2020-12-30 ENCOUNTER — Encounter: Payer: Self-pay | Admitting: Cardiology

## 2020-12-30 VITALS — BP 146/71 | HR 64 | Resp 16 | Ht 63.0 in | Wt 173.2 lb

## 2020-12-30 DIAGNOSIS — Z794 Long term (current) use of insulin: Secondary | ICD-10-CM

## 2020-12-30 DIAGNOSIS — I480 Paroxysmal atrial fibrillation: Secondary | ICD-10-CM

## 2020-12-30 DIAGNOSIS — I10 Essential (primary) hypertension: Secondary | ICD-10-CM

## 2020-12-30 DIAGNOSIS — E782 Mixed hyperlipidemia: Secondary | ICD-10-CM

## 2020-12-30 DIAGNOSIS — Z8673 Personal history of transient ischemic attack (TIA), and cerebral infarction without residual deficits: Secondary | ICD-10-CM

## 2020-12-30 DIAGNOSIS — I5032 Chronic diastolic (congestive) heart failure: Secondary | ICD-10-CM

## 2020-12-30 DIAGNOSIS — Z7901 Long term (current) use of anticoagulants: Secondary | ICD-10-CM

## 2020-12-30 DIAGNOSIS — Z9889 Other specified postprocedural states: Secondary | ICD-10-CM

## 2020-12-30 DIAGNOSIS — Z79899 Other long term (current) drug therapy: Secondary | ICD-10-CM

## 2020-12-30 MED ORDER — DAPAGLIFLOZIN PROPANEDIOL 10 MG PO TABS: 10.0000 mg | ORAL_TABLET | Freq: Every day | ORAL | 0 refills | Status: DC

## 2020-12-30 MED ORDER — CARVEDILOL 3.125 MG PO TABS: 3.1250 mg | ORAL_TABLET | Freq: Two times a day (BID) | ORAL | 0 refills | Status: DC

## 2020-12-30 NOTE — Progress Notes (Signed)
Date:  12/30/2020   ID:  [name] [name], DOB 11-Jul-1950, MRN [id number]  PCP:  [name] Leitz, MD  Cardiologist:  [name] Kras, DO, [facility] (established care 08/28/2020) Former Cardiology Providers: Dr. Minus [name], Dr. Berniece [name] Advanced heart failure specialist: Dr. Haroldine [name] and Dr. Aundra [name] Cardiac electrophysiologist: Dr. Caryl [name]  Date: 12/30/20 Last Office Visit: 11/24/2020  Chief Complaint  Patient presents with   heart failure management   Results   Follow-up    HPI  [name] [name] is a 70 y.o. female who presents to the office with a chief complaint of " heart failure management." Patient's past medical history and cardiovascular risk factors include: Recovered cardiomyopathy (prior LVEF less than 20% now 55-60% 09/22), chronic HFpEF, paroxysmal atrial fibrillation s/p TEE/cardioversion on oral anticoagulation, large pericardial effusion, hypertension, hyperlipidemia, insulin-dependent diabetes mellitus type 2, history of embolic stroke requiring tPA and mechanical thrombectomy, history of cardiogenic shock/PEA cardiac arrest immediately following thrombectomy, large left multi loculated extensive pleural effusion, advanced age, postmenopausal female.  She is referred to the office at the request of [name] Leitz, MD for evaluation of establish care given her chronic heart failure.  Patient transition her care from [facility] to [facility] cardiovascular as of August 2022.  Prior medical management has been difficult to uptitrate due to poor insight into her disease process and unable to verify her daily medications and multiple office visits.  Today she is accompanied by her oldest daughter [name] [name] (works at [employer] heart failure clinic).  She did also bring in her blood pressure and weight log for review.  Patient is blood pressures at home are consistently more than 130 mmHg.  And her home weight is between 163-167 pounds.  Patient denies orthopnea, paroxysmal nocturnal dyspnea or  lower extremity swelling.  In the past spironolactone was discontinued secondary to hyperkalemia, ARB/Arni was also discontinued due to hyperkalemia, and SGLT2 inhibitors still needed to be started.  FUNCTIONAL STATUS: No structured exercise program or daily routine.   ALLERGIES: Allergies  Allergen Reactions   Amiodarone Shortness Of Breath and Other (See Comments)    Soreness, wheezing, and weakness also Update:  tolerating Amio PO 06/2020   Fruit & Vegetable Daily [Nutritional Supplements] Shortness Of Breath, Swelling and Other (See Comments)    All fruits cause tongue swelling and numbness Organic fruits are okay to eat   Metoprolol Shortness Of Breath, Other (See Comments) and Cough    Soreness, wheezing, and weakness also   Other Shortness Of Breath, Swelling and Other (See Comments)    Tree nuts cause tongue swelling and numbness   Peanut-Containing Drug Products Shortness Of Breath, Swelling and Other (See Comments)    Tongue swelling and numbness   Aspirin Nausea And Vomiting and Other (See Comments)    Cannot take due to liver issues   Tylenol [Acetaminophen] Other (See Comments)    Cannot take due to liver issues- tolerating 650 mg tablets in 2022   Lactose Intolerance (Gi) Diarrhea and Nausea And Vomiting    MEDICATION LIST PRIOR TO VISIT: Current Meds  Medication Sig   acetaminophen (TYLENOL) 500 MG tablet Take 500 mg by mouth 3 (three) times daily. Morning and evening   amiodarone (PACERONE) 200 MG tablet Take 0.5 tablets (100 mg total) by mouth daily.   amLODipine (NORVASC) 5 MG tablet TAKE 1 TABLET(5 MG) BY MOUTH DAILY (Patient taking differently: Take 5 mg by mouth daily.)   apixaban (ELIQUIS) 5 MG TABS tablet TAKE 1 TABLET(5 MG) BY MOUTH TWICE DAILY  atorvastatin (LIPITOR) 80 MG tablet Take 1 tablet (80 mg total) by mouth daily.   B-D UF III MINI PEN NEEDLES 31G X 5 MM MISC INJECT WTH LANTUS PEN DAILY AS DIRECTED   bisacodyl (DULCOLAX) 10 MG suppository Place  10 mg rectally as needed for moderate constipation.   Blood Glucose Monitoring Suppl (ONETOUCH VERIO) w/Device KIT Please use to check blood sugar up to three times daily. E11.65   Blood Pressure KIT Monitor blood pressure twice a day   carvedilol (COREG) 3.125 MG tablet Take 1 tablet (3.125 mg total) by mouth 2 (two) times daily with a meal.   dapagliflozin propanediol (FARXIGA) 10 MG TABS tablet Take 1 tablet (10 mg total) by mouth daily.   digoxin (LANOXIN) 0.125 MG tablet TAKE 1 TABLET(0.125 MG) BY MOUTH DAILY   glucose blood (ONETOUCH VERIO) test strip Please use to check blood sugar up to three times daily. E11.65   insulin glargine (LANTUS SOLOSTAR) 100 UNIT/ML Solostar Pen Inject 18 Units into the skin daily.   isosorbide-hydrALAZINE (BIDIL) 20-37.5 MG tablet TAKE 2 TABLETS BY MOUTH THREE TIMES DAILY   Lancet Devices (ONE TOUCH DELICA LANCING DEV) MISC Please use to check blood sugar up to three times daily. E11.65   lidocaine (LIDODERM) 5 % Place 1 patch onto the skin daily. Remove & Discard patch within 12 hours or as directed by MD   Nutritional Supplements (EQUATE PO) Take 100 mg by mouth in the morning and at bedtime. Stool softener   OneTouch Delica Lancets 33G MISC Please use to check blood sugar up to three times daily. E11.65   pantoprazole (PROTONIX) 40 MG tablet TAKE 1 TABLET(40 MG) BY MOUTH DAILY     PAST MEDICAL HISTORY: Past Medical History:  Diagnosis Date   Abdominal pain    Acute respiratory failure with hypoxia (HCC)    Atrial flutter (HCC)    Atrial tachycardia (HCC) 07/29/2019   Back pain    Chronic anticoagulation - on Eliquis for chronic afib 02/04/2020   Chronic combined systolic and diastolic CHF (congestive heart failure) (HCC) 02/04/2020   Chronic heart failure with preserved ejection fraction (HFpEF) (HCC) 03/18/2020   LVEF less than 20% (May 2022), LVEF 55 to 60% (September 2022)   Constipation    COVID-19 02/04/2020   Diabetes mellitus without  complication (HCC)    Dysphagia 02/03/2020   Epigastric pain 02/03/2020   Fatigue    Full dentures    Hemorrhoids    History of esophageal stricture 07/29/2019   History of noncompliance with medical treatment, presenting hazards to health    Hyperlipidemia    Hypertension    Lack of adequate sleep    Lack of adequate sleep    Liver mass    Nonrheumatic tricuspid valve regurgitation 03/18/2020   Oral thrush 02/04/2020   PAF (paroxysmal atrial fibrillation) (HCC)    Pericardial effusion 05/12/2010   Pleural effusion 06/07/2020   Pleural effusion on left    Rash    on right breast   Stroke (cerebrum) (HCC) 05/12/2020   Uncontrolled type 2 diabetes mellitus with hyperglycemia (HCC) 06/08/2017   Wears glasses     PAST SURGICAL HISTORY: Past Surgical History:  Procedure Laterality Date   BREAST DUCTAL SYSTEM EXCISION     BREAST EXCISIONAL BIOPSY Left    CARDIOVERSION N/A 07/31/2019   Procedure: CARDIOVERSION;  Surgeon: Chrystie Nose, MD;  Location: [facility] ENDOSCOPY;  Service: Cardiovascular;  Laterality: N/A;   CARDIOVERSION N/A 05/19/2020   Procedure: CARDIOVERSION;  Surgeon: Jolaine Artist, MD;  Location: [place];  Service: Cardiovascular;  Laterality: N/A;   CHOLECYSTECTOMY     CHOLECYSTECTOMY, LAPAROSCOPIC  07/24/2010   ESOPHAGEAL DILATION     IR CT HEAD LTD  05/12/2020   IR PERCUTANEOUS ART THROMBECTOMY/INFUSION INTRACRANIAL INC DIAG ANGIO  05/12/2020       IR PERCUTANEOUS ART THROMBECTOMY/INFUSION INTRACRANIAL INC DIAG ANGIO  05/12/2020   IR US GUIDE VASC ACCESS RIGHT  05/12/2020   RADIOLOGY WITH ANESTHESIA N/A 05/12/2020   Procedure: IR WITH ANESTHESIA;  Surgeon: Radiologist, Medication, MD;  Location: [facility];  Service: Radiology;  Laterality: N/A;   TEE WITHOUT CARDIOVERSION N/A 07/31/2019   Procedure: TRANSESOPHAGEAL ECHOCARDIOGRAM (TEE);  Surgeon: Pixie Casino, MD;  Location: [facility] ENDOSCOPY;  Service: Cardiovascular;  Laterality: N/A;   TEE WITHOUT CARDIOVERSION N/A  05/19/2020   Procedure: TRANSESOPHAGEAL ECHOCARDIOGRAM (TEE);  Surgeon: Jolaine Artist, MD;  Location: [facility] ENDOSCOPY;  Service: Cardiovascular;  Laterality: N/A;    FAMILY HISTORY: The patient family history includes Alcohol abuse in an other family member; Diabetes in her sister, sister and another family member; Hypertension in her sister; Multiple sclerosis in her sister; Other in her father; Stroke in her mother.  SOCIAL HISTORY:  The patient  reports that she has never smoked. She has never used smokeless tobacco. She reports that she does not drink alcohol and does not use drugs.  REVIEW OF SYSTEMS: Review of Systems  Constitutional: Negative for chills, fever and malaise/fatigue.  HENT:  Negative for hoarse voice and nosebleeds.   Eyes:  Negative for discharge, double vision and pain.  Cardiovascular:  Negative for chest pain, claudication, dyspnea on exertion, leg swelling, near-syncope, orthopnea, palpitations, paroxysmal nocturnal dyspnea and syncope.  Respiratory:  Negative for hemoptysis and shortness of breath.   Musculoskeletal:  Negative for muscle cramps and myalgias.  Gastrointestinal:  Negative for abdominal pain, constipation, diarrhea, hematemesis, hematochezia, melena, nausea and vomiting.  Neurological:  Negative for dizziness and light-headedness.   PHYSICAL EXAM: Vitals with BMI 12/30/2020 11/24/2020 10/13/2020  Height $Remov'5\' 3"'yGTtdl$  $Remove'5\' 3"'oIXsrLV$  $RemoveB'5\' 3"'YxCuoZrr$   Weight 173 lbs 3 oz 173 lbs 3 oz 168 lbs  BMI 30.69 75.17 00.17  Systolic 494 496 759  Diastolic 71 64 88  Pulse 64 64 69    CONSTITUTIONAL: Well-developed and well-nourished. No acute distress.  SKIN: Skin is warm and dry. No rash noted. No cyanosis. No pallor. No jaundice HEAD: Normocephalic and atraumatic.  EYES: No scleral icterus MOUTH/THROAT: Moist oral membranes.  NECK: No JVD present. No thyromegaly noted. No carotid bruits  LYMPHATIC: No visible cervical adenopathy.  CHEST Normal respiratory effort. No  intercostal retractions  LUNGS: Clear to auscultation bilaterally.  No stridor. No wheezes. No rales.  CARDIOVASCULAR: Regular, positive F6-B8, soft holosystolic murmur heard at the left lower sternal border, no gallops or rubs. ABDOMINAL: Soft, nontender, nondistended, positive bowel sounds in all 4 quadrants, no apparent ascites.  EXTREMITIES: No peripheral edema, warm to touch bilaterally, 2+ bilateral posterior tibial pulses. HEMATOLOGIC: No significant bruising NEUROLOGIC: Oriented to person, place, and time. Nonfocal. Normal muscle tone.  PSYCHIATRIC: Normal mood and affect. Normal behavior. Cooperative  CARDIAC DATABASE: EKG: 08/28/2020: Normal sinus rhythm, 74 bpm, diffuse ST-T changes cannot rule out underlying ischemia.  Similar findings on prior EKG dated 06/07/2020.  Echocardiogram: 05/12/20:  LVEF <20%, severely reduced LVEF, global hypokinesis, severe LVH, no LV thrombus visualized, diastolic dysfunction is indeterminate due to atrial fibrillation, RV size mildly dilated, RV systolic function severely reduced RVSP 40.6  mmHg, large circumferential pericardial effusion, IVC dilated and not collapsible, mild MR, moderate to severe TR, trivial AR aortic valve sclerosis without stenosis left atrium mildly dilated.    09/30/2020: 1. Normal LV systolic function with visual EF 55-60%. Left ventricle cavity is normal in size. Moderate left ventricular hypertrophy. Normal global wall motion.Doppler evidence of grade III (restrictive) diastolic dysfunction, elevated LAP. 2. Left atrial cavity is mildly dilated. 3. Mild (Grade I) mitral regurgitation. 4. Mild tricuspid regurgitation. No evidence of pulmonary hypertension. RVSP measures 33 mmHg. 5. Mild pulmonic regurgitation. 6. Small pericardial effusion, located posteriorly, without hemodynamic significance. 7. IVC is normal with a respiratory response of <50%. 8. Compared to study 05/12/2020 LVEF improved from <20% to 55-60%, RV function  has improved, pulmonary hypertension has resolved prior RVSP 40.2mmHg and now 83mmHg, pericardial effusion was large and now small in size, Moderate / severe TR is now mild. Otherwise, no significant change.   LABORATORY DATA: CBC Latest Ref Rng & Units 07/23/2020 06/20/2020 06/19/2020  WBC 3.4 - 10.8 x10E3/uL 6.8 - 7.1  Hemoglobin 11.1 - 15.9 g/dL 13.0 9.9(L) 9.9(L)  Hematocrit 34.0 - 46.6 % 43.7 32.9(L) 32.4(L)  Platelets 150 - 450 x10E3/uL 291 - 491(H)    CMP Latest Ref Rng & Units 12/10/2020 09/10/2020 07/23/2020  Glucose 70 - 99 mg/dL 231(H) 198(H) 214(H)  BUN 8 - 27 mg/dL $Remove'20 10 13  'XLXdyAH$ Creatinine 0.57 - 1.00 mg/dL 1.20(H) 0.82 0.78  Sodium 134 - 144 mmol/L 139 142 137  Potassium 3.5 - 5.2 mmol/L 5.0 4.3 4.3  Chloride 96 - 106 mmol/L 99 100 97  CO2 20 - 29 mmol/L 24 28 18(L)  Calcium 8.7 - 10.3 mg/dL 10.1 9.7 9.8  Total Protein 6.0 - 8.5 g/dL 7.2 - 7.8  Total Bilirubin 0.0 - 1.2 mg/dL 0.2 - <0.2  Alkaline Phos 44 - 121 IU/L 81 - 89  AST 0 - 40 IU/L 15 - 37  ALT 0 - 32 IU/L 30 - 25    Lipid Panel  Lab Results  Component Value Date   CHOL 87 05/13/2020   HDL 17 (L) 05/13/2020   LDLCALC 61 05/13/2020   TRIG 46 05/13/2020   CHOLHDL 5.1 05/13/2020     No components found for: NTPROBNP Recent Labs    12/10/20 1210  PROBNP 536*   Recent Labs    02/04/20 0657 05/13/20 0527 09/10/20 1543  TSH 2.436 1.420 3.080    BMP Recent Labs    03/03/20 1559 03/05/20 1524 03/18/20 1645 06/16/20 0504 06/19/20 0613 06/20/20 0510 07/23/20 1556 09/10/20 1543 12/10/20 1211  NA 138 139   < > 135 134* 134* 137 142 139  K 5.1 4.8   < > 4.3 4.4 4.8 4.3 4.3 5.0  CL 104 102   < > 102 102 100 97 100 99  CO2 21 24   < > $R'29 26 27 'xA$ 18* 28 24  GLUCOSE 161* 197*   < > 92 90 105* 214* 198* 231*  BUN 16 17   < > 11 7* $Rem'8 13 10 20  'ExUN$ CREATININE 0.96 1.08*   < > 0.46 0.64 0.80 0.78 0.82 1.20*  CALCIUM 9.1 8.9   < > 8.1* 8.5* 8.6* 9.8 9.7 10.1  GFRNONAA 61 53*   < > >60 >60 >60  --   --   --    GFRAA 70 61  --   --   --   --   --   --   --    < > =  values in this interval not displayed.    HEMOGLOBIN A1C Lab Results  Component Value Date   HGBA1C 9.3 (H) 12/10/2020   MPG 254.65 05/13/2020    IMPRESSION:    ICD-10-CM   1. Chronic heart failure with preserved ejection fraction (HFpEF) (HCC)  I50.32 carvedilol (COREG) 3.125 MG tablet    dapagliflozin propanediol (FARXIGA) 10 MG TABS tablet    Basic metabolic panel    Magnesium    Pro b natriuretic peptide (BNP)    2. Paroxysmal A-fib (HCC)  I48.0     3. History of cardioversion  Z98.890     4. Long term current use of antiarrhythmic drug  Z79.899     5. Long term (current) use of anticoagulants  Z79.01     6. Benign hypertension  I10     7. Mixed hyperlipidemia  E78.2     8. Type 2 diabetes mellitus with other circulatory complication, with long-term current use of insulin (HCC)  E11.59    Z79.4     9. Long-term insulin use (HCC)  Z79.4     10. Hx of TIA (transient ischemic attack) and stroke  Z86.73        RECOMMENDATIONS: HALINA ASANO is a 70 y.o. female whose past medical history and cardiac risk factors include: Recovered cardiomyopathy (prior LVEF less than 20%), chronic HFpEF, paroxysmal atrial fibrillation s/p TEE/cardioversion on oral anticoagulation, large pericardial effusion, hypertension, hyperlipidemia, insulin-dependent diabetes mellitus type 2, history of embolic stroke requiring tPA and mechanical thrombectomy, history of cardiogenic shock/PEA cardiac arrest immediately following thrombectomy, large left multi loculated extensive pleural effusion, advanced age, postmenopausal female.  Chronic HFpEF (heart failure with preserved ejection fraction) ([place]) Echo May 2022 LVEF less than 20%, followed by 09/2020  LVEF 55-60% with grade 3 diastolic impairment  Medications reviewed.  Will add Coreg 3.$RemoveBef'125mg'cIBQmyNHzo$  po bid (did review her intolerance w/ metoprolol).  Will add Farxiga $RemoveBef'10mg'WkEcpVJdXa$  po qday & labs one  week later.  Based on EMR patient has been intolerant to spironolactone due to hyperkalemia, ARB/Arni was discontinued due to hyperkalemia, and SGLT2 inhibitors were not attempted in the past. Continue logging her BP, daily weights.   Paroxysmal A-fib (HCC) Rate control: Digoxin and Coreg. Rhythm control: Amiodarone Thromboembolic prophylaxis: Eliquis CHA2DS2-VASc SCORE is 7 which correlates to 9.6 % risk of stroke per year (CHF, hypertension, age, diabetes, prior stroke, female) History of TEE guided cardioversion 05/19/2020 Patient and daughter are made aware that amiodarone is off label use for management of paroxysmal atrial fibrillation.  It has a potential to effect multiple organs and therefore she will need follow-up evaluation with regards to thyroid function, liver function, ophthalmological evaluation, PFTs/chest x-ray. If she tolerates Coreg will d/c digoxin at the next visit.   Long-term antiarrhythmic medications: Will reduce the dose of amiodarone 200 mg p.o. daily TSH within normal limits (08/2020), LFTs within normal limits (07/2020),  CXR and Pfts were done at the last office visit - but not done as of today (daughter made aware).  Patient and her daughter are informed of undergoing an eye exam as well.  Long term (current) use of anticoagulants Indication: Paroxysmal atrial fibrillation Reiterated the risks, benefits, and alternatives to oral anticoagulation.  Both patient and daughter are very of the complications of being on oral anticoagulation which has a potential to increase morbidity mortality.  They are aware that if she has an injury despite the mechanism she should be evaluated by going to the closest ER via EMS to rule  out intracranial and/or GI bleed.  Benign hypertension Within acceptable range  Medications reconciled. Asked to keep a log of her blood pressures and to bring it in at the next office visit. Low-salt diet recommended  Mixed  hyperlipidemia Continue atorvastatin 80 mg p.o. nightly. Most recent lipid profile notes an LDL <70 mg/dL Does not endorse myalgias. Continue to monitor  Type 2 diabetes mellitus with other circulatory complication, with long-term current use of insulin ([place]) Educated on the importance of glycemic control. Most recent hemoglobin A1c 9.3% Currently managed per primary team.  Hx of embolic stroke Status post tPA and mechanical thrombectomy Educated on the importance of secondary prevention.  Patient has poor insight on her chronic comorbid conditions and therefore medication titration has been difficult despite being seen at multiple office visits.  Again I reached out to the patient's daughter Jamas Lav during the encounter to discuss HPI, disease management, and the importance of following through.  FINAL MEDICATION LIST END OF ENCOUNTER: Meds ordered this encounter  Medications   carvedilol (COREG) 3.125 MG tablet    Sig: Take 1 tablet (3.125 mg total) by mouth 2 (two) times daily with a meal.    Dispense:  180 tablet    Refill:  0   dapagliflozin propanediol (FARXIGA) 10 MG TABS tablet    Sig: Take 1 tablet (10 mg total) by mouth daily.    Dispense:  90 tablet    Refill:  0     Current Outpatient Medications:    acetaminophen (TYLENOL) 500 MG tablet, Take 500 mg by mouth 3 (three) times daily. Morning and evening, Disp: , Rfl:    amiodarone (PACERONE) 200 MG tablet, Take 0.5 tablets (100 mg total) by mouth daily., Disp: 90 tablet, Rfl: 0   amLODipine (NORVASC) 5 MG tablet, TAKE 1 TABLET(5 MG) BY MOUTH DAILY (Patient taking differently: Take 5 mg by mouth daily.), Disp: 90 tablet, Rfl: 1   apixaban (ELIQUIS) 5 MG TABS tablet, TAKE 1 TABLET(5 MG) BY MOUTH TWICE DAILY, Disp: 180 tablet, Rfl: 1   atorvastatin (LIPITOR) 80 MG tablet, Take 1 tablet (80 mg total) by mouth daily., Disp: 90 tablet, Rfl: 1   B-D UF III MINI PEN NEEDLES 31G X 5 MM MISC, INJECT WTH LANTUS PEN DAILY AS  DIRECTED, Disp: 100 each, Rfl: 1   bisacodyl (DULCOLAX) 10 MG suppository, Place 10 mg rectally as needed for moderate constipation., Disp: , Rfl:    Blood Glucose Monitoring Suppl (ONETOUCH VERIO) w/Device KIT, Please use to check blood sugar up to three times daily. E11.65, Disp: 1 kit, Rfl: 0   Blood Pressure KIT, Monitor blood pressure twice a day, Disp: 1 kit, Rfl: 0   carvedilol (COREG) 3.125 MG tablet, Take 1 tablet (3.125 mg total) by mouth 2 (two) times daily with a meal., Disp: 180 tablet, Rfl: 0   dapagliflozin propanediol (FARXIGA) 10 MG TABS tablet, Take 1 tablet (10 mg total) by mouth daily., Disp: 90 tablet, Rfl: 0   digoxin (LANOXIN) 0.125 MG tablet, TAKE 1 TABLET(0.125 MG) BY MOUTH DAILY, Disp: 90 tablet, Rfl: 1   glucose blood (ONETOUCH VERIO) test strip, Please use to check blood sugar up to three times daily. E11.65, Disp: 100 each, Rfl: 12   insulin glargine (LANTUS SOLOSTAR) 100 UNIT/ML Solostar Pen, Inject 18 Units into the skin daily., Disp: 15 mL, Rfl: 0   isosorbide-hydrALAZINE (BIDIL) 20-37.5 MG tablet, TAKE 2 TABLETS BY MOUTH THREE TIMES DAILY, Disp: 180 tablet, Rfl: 0   Lancet Devices (ONE  TOUCH DELICA LANCING DEV) MISC, Please use to check blood sugar up to three times daily. E11.65, Disp: 1 each, Rfl: 0   lidocaine (LIDODERM) 5 %, Place 1 patch onto the skin daily. Remove & Discard patch within 12 hours or as directed by MD, Disp: 30 patch, Rfl: 0   Nutritional Supplements (EQUATE PO), Take 100 mg by mouth in the morning and at bedtime. Stool softener, Disp: , Rfl:    OneTouch Delica Lancets 89H MISC, Please use to check blood sugar up to three times daily. E11.65, Disp: 100 each, Rfl: 12   pantoprazole (PROTONIX) 40 MG tablet, TAKE 1 TABLET(40 MG) BY MOUTH DAILY, Disp: 90 tablet, Rfl: 0  Orders Placed This Encounter  Procedures   Basic metabolic panel   Magnesium   Pro b natriuretic peptide (BNP)   There are no Patient Instructions on file for this visit.    --Continue cardiac medications as reconciled in final medication list. --Return in about 6 weeks (around 02/10/2021) for heart failure management.. Or sooner if needed. --Continue follow-up with your primary care physician regarding the management of your other chronic comorbid conditions.  Patient's questions and concerns were addressed to her satisfaction. She voices understanding of the instructions provided during this encounter.   This note was created using a voice recognition software as a result there may be grammatical errors inadvertently enclosed that do not reflect the nature of this encounter. Every attempt is made to correct such errors.  [name] [name], [place], [facility]  Pager: [phone] Office: [phone]

## 2021-01-02 ENCOUNTER — Other Ambulatory Visit: Payer: Self-pay

## 2021-01-02 ENCOUNTER — Ambulatory Visit (HOSPITAL_COMMUNITY)
Admission: RE | Admit: 2021-01-02 | Discharge: 2021-01-02 | Disposition: A | Discharge status: Home / Self Care | Payer: Medicare Other | Source: Ambulatory Visit | Attending: Cardiology | Admitting: Cardiology

## 2021-01-02 DIAGNOSIS — Z79899 Other long term (current) drug therapy: Secondary | ICD-10-CM | POA: Diagnosis present

## 2021-01-05 ENCOUNTER — Other Ambulatory Visit: Payer: Self-pay | Admitting: Family Medicine

## 2021-01-06 ENCOUNTER — Other Ambulatory Visit: Payer: Self-pay

## 2021-01-06 DIAGNOSIS — I5032 Chronic diastolic (congestive) heart failure: Secondary | ICD-10-CM

## 2021-01-07 LAB — BASIC METABOLIC PANEL
BUN/Creatinine Ratio: 16 (ref 12–28)
BUN: 21 mg/dL (ref 8–27)
CO2: 24 mmol/L (ref 20–29)
Calcium: 9.4 mg/dL (ref 8.7–10.3)
Chloride: 99 mmol/L (ref 96–106)
Creatinine, Ser: 1.28 mg/dL — ABNORMAL HIGH (ref 0.57–1.00)
Glucose: 342 mg/dL — ABNORMAL HIGH (ref 70–99)
Potassium: 5.2 mmol/L (ref 3.5–5.2)
Sodium: 136 mmol/L (ref 134–144)
eGFR: 45 mL/min/{1.73_m2} — ABNORMAL LOW (ref >59)

## 2021-01-07 LAB — PRO B NATRIURETIC PEPTIDE: NT-Pro BNP: 428 pg/mL — ABNORMAL HIGH (ref 0–301)

## 2021-01-07 LAB — MAGNESIUM: Magnesium: 1.6 mg/dL (ref 1.6–2.3)

## 2021-01-09 NOTE — Progress Notes (Signed)
Called pt daughter no answer, left a vm

## 2021-01-13 NOTE — Progress Notes (Signed)
(  Patient also has another message in queue.)

## 2021-01-13 NOTE — Progress Notes (Signed)
2nd attempt : Called patient daughter [place], [place], [place].

## 2021-01-13 NOTE — Progress Notes (Signed)
Called and spoke with patient regarding her CXR results.

## 2021-01-14 ENCOUNTER — Ambulatory Visit (INDEPENDENT_AMBULATORY_CARE_PROVIDER_SITE_OTHER): Payer: Medicare Other | Admitting: Pharmacist

## 2021-01-14 ENCOUNTER — Other Ambulatory Visit: Payer: Self-pay

## 2021-01-14 DIAGNOSIS — E119 Type 2 diabetes mellitus without complications: Secondary | ICD-10-CM

## 2021-01-14 NOTE — Patient Instructions (Signed)
Miss Torry it was a pleasure seeing you today.   The sensor is small waterproof disc that is placed on the back of the upper arm.  There is a very thin filament that is inserted under the surface of the skin and measures the amount of glucose in the interstitial fluid.  This system collects your sugar levels for up to 14 days and it automatically records the glucose level every 15 minutes. This will show your provider any patterns in your glucose levels.  Please remember... 1. Sensor will last 14 days 2. Sensor should be applied to area away from scarring, tattoos, irritation, and bones. 3. Starting the sensor: 1 hour warm up before BG readings available   4. Scan the sensor at least every 8 hours 5. Hold reader within 1.5 inches of sensor to scan 6. When the blood drop and magnifying glass symbol appears, test fingerstick blood glucose prior to making treatment decisions 7. Do a fingerstick blood glucose test if the sensor readings do not match how you feel 8. Remove sensor prior to magnetic resonance imaging (MRI), computed tomography (CT) scan, or high-frequency electrical heat (diathermy) treatment. 9. Freestyle Libre may be worn through a Environmental education officer. It may not be exposed to an advanced Imaging Technology (AIT) body scanner (also called a millimeter wave scanner) or the baggage x-ray machine. Instead, ask for hand-wanding or full-body pat-down and visual inspection.  10. Doses of vitamin C (ascorbic acid) >500 mg every day may cause false high readings. 11. Do not submerge more than 3 feet or keep underwater longer than 30 minutes at a time. Gently pat to dry.  12. Store sensor kit between 39 and 77 degrees Farenheit. Can be refrigerated within this temperature range.  Problems with Freestyle Libre sticking? 1. Order Tegaderm I.V. films to place directly over [facility] sensor on arm. 2. May also order Skin Tac from [facility]. Alcohol swab area you plan to administer  Freestyle Libre then let dry. Once dry, apply Skin Tac in a circular motion (with a spot in the middle for sensor without skin tac) and let dry. Once dry you can apply Freestyle Libre   Problems taking off Freestyle [place]? 1. Remember to try to shower before removing Freestyle Libre 2. Order Tac Away to help remove any extra adhesive left on your skin once you remove Freestyle Libre 3. May also try baby oil to loosen adhesive  [place] Phone number: [phone] Available 7 days a week; excluding holidays 8 AM to 8PM EST  [email or web address]  Please do the following:  Start Lantus 12 units twice a day as directed today during your appointment. If you have any questions or if you believe something has occurred because of this change, call me or your doctor to let one of [place] know.  Continue checking blood sugars at home. It's really important that you record these and bring these in to your next doctor's appointment.  Continue making the lifestyle changes we've discussed together during our visit. Diet and exercise play a significant role in improving your blood sugars.  Please start dapagliflozin Wilder Glade) 33m once daily Follow-up with me in four weeks  Hypoglycemia or low blood sugar:   Low blood sugar can happen quickly and may become an emergency if not treated right away.   While this shouldn't happen often, it can be brought upon if you skip a meal or do not eat enough. Also, if your insulin or other diabetes medications are dosed  too high, this can cause your blood sugar to go to low.   Warning signs of low blood sugar include: Feeling shaky or dizzy Feeling weak or tired  Excessive hunger Feeling anxious or upset  Sweating even when you aren't exercising  What to do if I experience low blood sugar? Follow the Rule of 15 Check your blood sugar. If lower than 70, proceed to step 2.  Treat with 15 grams of fast acting carbs which is found in 3-4 glucose  tablets. If none are available you can try hard candy, 1 tablespoon of sugar or honey,4 ounces of fruit juice, or 6 ounces of REGULAR soda.  Re-check your sugar in 15 minutes. If it is still below 70, do what you did in step 2 again. If your blood sugar has come back up, go ahead and eat a snack or small meal made up of complex carbs (ex. Whole grains) and protein at this time to avoid recurrence of low blood sugar.

## 2021-01-14 NOTE — Assessment & Plan Note (Signed)
T2DM is not controlled likely due to apprehension in therapy changes. Based on patient's CGM data she has no reported hypoglycemic events and is mainly hyperglycemic. Discussed extensively with patient benefits of Farxiga and recommended patient initiate medication. Patient apprehensive and would not commit to whether or not she will or will not trial medication. In the meantime will increase Lantus from 20 units to 12 units twice daily (24 units total) as patient would like to split up dosing. Following instruction patient verbalized understanding of treatment plan.   1. Increase dose of basal insulin Lantus to 12 units twice daily (24 units total) 2. Recommended patient start Marcelline Deist 5mg  once daily 3. Extensively discussed pathophysiology of diabetes, dietary effects on blood sugar control, and recommended lifestyle interventions. 4. Counseled on s/sx of and management of hypoglycemia 5. Next A1C anticipated February 2023.

## 2021-01-14 NOTE — Progress Notes (Signed)
Subjective:    Patient ID: [name] [name], female    DOB: [birth year], 71 y.o.   MRN: [id number]  HPI Patient is a 71 y.o. female who presents for diabetes management. She is in good spirits and presents with assistance of walker. Patient was referred and last seen by Primary Care Provider on 09/03/20. Last seen in pharmacy clinic on 12/10/20.  Patient reports she was able to obtain her Libre CGM supplies and is happy to be using device again. She also reports that she does not want to continue taking any additional medications for her diabetes besides her Lantus. She states the Lantus works well for her and that when she was on Ozempic she had a lump in her throat that she did not tell anyone about but after stopping Ozempic the lump went away.   Patient reports diabetes was diagnosed around 17 years ago.   Insurance coverage/medication affordability: Medicare/Medicaid, no Part D  Current diabetes medications include: Lantus 20 units every night Current hypertension medications include: amlodipine 5mg , isosorbide-hydralazine 20-37.5mg  2 tablets TID Current hyperlipidemia medications include: atorvastatin 80mg  Patient states that She is taking her medications as prescribed. Patient reports adherence with medications.    Do you feel that your medications are working for you?  yes  Have you been experiencing any side effects to the medications prescribed? No   Do you have any problems obtaining medications due to transportation or finances?  no    Patient reports that she loves diet coke without caffeine and drinks water. She has been eating more fruits and veggies.  Patient reports hypoglycemic event of 62 which she states was due to lack of eating. She appropriately treated and monitored improvement in blood glucose. Patient denies polyuria (increased urination). Patient denies polyphagia (increased appetite). Patient denies polydipsia (increased thirst).  Patient denies neuropathy  (nerve pain) Patient denies visual changes. Patient reports self foot exams.  Self-reported fasting blood glucose: 70-110 Self-reported post-prandial/random blood glucose: 150-180  Objective:   CGM 14 day data:    Labs:   Physical Exam Neurological:     Mental Status: She is alert and oriented to person, place, and time.    Review of Systems  Gastrointestinal:  Negative for nausea and vomiting.   Lab Results  Component Value Date   HGBA1C 9.3 (H) 12/10/2020   HGBA1C 7.6 (A) 07/23/2020   HGBA1C 10.5 (H) 05/13/2020    Lab Results  Component Value Date   MICRALBCREAT 30-300 09/06/2017    Lipid Panel     Component Value Date/Time   CHOL 87 05/13/2020 0527   CHOL 132 06/08/2017 1110   TRIG 46 05/13/2020 0527   HDL 17 (L) 05/13/2020 0527   HDL 35 (L) 06/08/2017 1110   CHOLHDL 5.1 05/13/2020 0527   VLDL 9 05/13/2020 0527   LDLCALC 61 05/13/2020 0527   LDLCALC 67 06/08/2017 1110    Clinical Atherosclerotic Cardiovascular Disease (ASCVD): Yes  The ASCVD Risk score (Arnett DK, et al., 2019) failed to calculate for the following reasons:   The patient has a prior MI or stroke diagnosis    Assessment/Plan:   T2DM is not controlled likely due to apprehension in therapy changes. Based on patient's CGM data she has no reported hypoglycemic events and is mainly hyperglycemic. Discussed extensively with patient benefits of Farxiga and recommended patient initiate medication. Patient apprehensive and would not commit to whether or not she will or will not trial medication. In the meantime will increase Lantus from 20 units  to 12 units twice daily (24 units total) as patient would like to split up dosing. Following instruction patient verbalized understanding of treatment plan.   Increase dose of basal insulin Lantus to 12 units twice daily (24 units total) Recommended patient start Wilder Glade 5mg  once daily Extensively discussed pathophysiology of diabetes, dietary effects on  blood sugar control, and recommended lifestyle interventions. Counseled on s/sx of and management of hypoglycemia Next A1C anticipated February 2023.  ASCVD risk - secondary prevention in patient with diabetes. Last LDL is controlled.   Continued atorvastatin 80 mg.   Follow-up appointment four weeks to review sugar readings. Written patient instructions provided.  This appointment required 30 minutes of direct patient care.  Thank you for involving pharmacy to assist in providing this patient's care.

## 2021-01-14 NOTE — Progress Notes (Signed)
Called pts daughter. No answer. Left vm requesting call back.

## 2021-01-19 ENCOUNTER — Other Ambulatory Visit: Payer: Self-pay | Admitting: Family Medicine

## 2021-01-21 NOTE — Progress Notes (Signed)
Please call to schedule appointment for evaluation.

## 2021-02-02 ENCOUNTER — Other Ambulatory Visit: Payer: Self-pay

## 2021-02-02 MED ORDER — MAGNESIUM 400 MG PO TABS: 1.0000 | ORAL_TABLET | Freq: Two times a day (BID) | ORAL | 0 refills | Status: DC

## 2021-02-04 NOTE — Patient Instructions (Addendum)
Thank you for coming to see me today. It was a pleasure.   I have placed an order for chest xray.  Please go to [facility] at [employer] or at [facility] to have this completed.  You do not need an appointment, but if you would like to call them beforehand, their number is [phone].  We will contact you with your results afterwards.   Please schedule an appointment with Dr Raymondo Band for Pulmonary function testing  Follow up with Dr Tresa Endo in February as scheduled for Diabetes management  Please follow-up with PCP as needed  If you have any questions or concerns, please do not hesitate to call the office at [phone].  Best,   Dana Allan, MD

## 2021-02-04 NOTE — Progress Notes (Signed)
° ° °  SUBJECTIVE:   CHIEF COMPLAINT / HPI: check up diabetes  No acute concerns  Type 2 DM Asymptomatic.  Following with pharmacy clinic and had recent visit. Now on Lantus 12 units BID.  CBG at home 103-154.  Most recent A1c 9.3.  Currently asymptomatic.  HTN Followed by Cardiology. Needing refill of Amlodipine.  Denies any chest pain, shortness of breath, headaches, visual changes or lower extremity edema. .  Follow up xray results Recently had chest xray for long term Amiodarone use.  Notable for bronchitic changes vs atelectasis. Denies any history of tobacco use, cough or shortness of breath.   PERTINENT  PMH / PSH:  HTN DM Type 2 HFrEF PE arrest s/p thrombectomy Afib on anticoagulation  OBJECTIVE:   BP (!) 152/69    Pulse 75    Ht 5\' 3"  (1.6 m)    Wt 176 lb 6.4 oz (80 kg)    SpO2 97%    BMI 31.25 kg/m    General: Alert, no acute distress Cardio: Normal S1 and S2, RRR, no r/m/g Pulm: CTAB, normal work of breathing Abdomen: Bowel sounds normal. Abdomen soft and non-tender.  Extremities: No peripheral edema.    ASSESSMENT/PLAN:   Hypertension Refill Amlodipine Continue current medications Follow up with Cardiology as scheduled  Type 2 diabetes mellitus with hyperglycemia (HCC) A1c 9.3. Asymptomatic -Follow up with Pharm clinic as scheduled -Continue current medications -Refill Glargine 12 u BID -Next A1c 02/23  Abnormal chest x-ray Repeat chest xray Patient to schedule appointment with Dr 3/23 for PFT's Consider Pulm referral pending results     Raymondo Band, MD [facility]

## 2021-02-05 ENCOUNTER — Other Ambulatory Visit: Payer: Self-pay

## 2021-02-05 ENCOUNTER — Encounter: Payer: Self-pay | Admitting: Family Medicine

## 2021-02-05 ENCOUNTER — Ambulatory Visit (INDEPENDENT_AMBULATORY_CARE_PROVIDER_SITE_OTHER): Payer: Medicare Other | Admitting: Family Medicine

## 2021-02-05 VITALS — BP 152/69 | HR 75 | Ht 63.0 in | Wt 176.4 lb

## 2021-02-05 DIAGNOSIS — E1165 Type 2 diabetes mellitus with hyperglycemia: Secondary | ICD-10-CM | POA: Diagnosis not present

## 2021-02-05 DIAGNOSIS — E1169 Type 2 diabetes mellitus with other specified complication: Secondary | ICD-10-CM

## 2021-02-05 DIAGNOSIS — R9389 Abnormal findings on diagnostic imaging of other specified body structures: Secondary | ICD-10-CM | POA: Diagnosis not present

## 2021-02-05 DIAGNOSIS — I10 Essential (primary) hypertension: Secondary | ICD-10-CM

## 2021-02-05 MED ORDER — LANTUS SOLOSTAR 100 UNIT/ML ~~LOC~~ SOPN: 12.0000 [IU] | PEN_INJECTOR | Freq: Two times a day (BID) | SUBCUTANEOUS | 2 refills | Status: DC

## 2021-02-05 MED ORDER — AMLODIPINE BESYLATE 5 MG PO TABS: 5.0000 mg | ORAL_TABLET | Freq: Every day | ORAL | 1 refills | Status: DC

## 2021-02-08 ENCOUNTER — Encounter: Payer: Self-pay | Admitting: Family Medicine

## 2021-02-08 DIAGNOSIS — R9389 Abnormal findings on diagnostic imaging of other specified body structures: Secondary | ICD-10-CM

## 2021-02-08 HISTORY — DX: Abnormal findings on diagnostic imaging of other specified body structures: R93.89

## 2021-02-08 NOTE — Assessment & Plan Note (Signed)
Refill Amlodipine Continue current medications Follow up with Cardiology as scheduled

## 2021-02-08 NOTE — Assessment & Plan Note (Signed)
A1c 9.3. Asymptomatic -Follow up with Pharm clinic as scheduled -Continue current medications -Refill Glargine 12 u BID -Next A1c 02/23

## 2021-02-08 NOTE — Assessment & Plan Note (Signed)
Repeat chest xray Patient to schedule appointment with Dr Raymondo Band for PFT's Consider Pulm referral pending results

## 2021-02-09 ENCOUNTER — Ambulatory Visit (INDEPENDENT_AMBULATORY_CARE_PROVIDER_SITE_OTHER): Payer: Medicare Other | Admitting: Pharmacist

## 2021-02-09 ENCOUNTER — Encounter: Payer: Self-pay | Admitting: Pharmacist

## 2021-02-09 ENCOUNTER — Other Ambulatory Visit: Payer: Self-pay

## 2021-02-09 DIAGNOSIS — I4891 Unspecified atrial fibrillation: Secondary | ICD-10-CM

## 2021-02-09 NOTE — Assessment & Plan Note (Signed)
Spirometry evaluation reveals possible restrictive lung disease. Post nebulized albuterol tx revealed no significant Pre/Post change.  Patient has taken amiodarone for approximately 1 year.   -Patient referred back to PCP for potential treatment options. -Patient instructed to follow up with [place] if she feels any shortness of breath while on her amiodarone.

## 2021-02-09 NOTE — Progress Notes (Signed)
Reviewed: I agree with Dr. Koval's documentation and management. 

## 2021-02-09 NOTE — Progress Notes (Signed)
° °  S:    Patient arrives in good spirits ambulating with use of a walker. Presents for lung function evaluation.    Patient was referred and last seen by Primary Care Provider, Dr. Volanda Napoleon, on 02/05/2022.   Patient reports breathing has been good. Patient reports no history of asthma or COPD. Patient has been on Amiodarone since January 2022. Patient endorses feeling tired after climbing a set of stairs but not winded.  No PFT history.   Medication adherence reported good. Patient is not on any medications for COPD or Asthma.  O: Physical Exam Constitutional:      Appearance: Normal appearance. She is normal weight.  Pulmonary:     Effort: Pulmonary effort is normal.  Neurological:     Mental Status: She is alert.  Psychiatric:        Mood and Affect: Mood normal.        Behavior: Behavior normal.        Thought Content: Thought content normal.        Judgment: Judgment normal.    Review of Systems  All other systems reviewed and are negative.  Vitals:   02/09/21 0905  BP: 130/84  Pulse: 65  SpO2: 98%   CAT score= 5 See Documentation Flowsheet - CAT/COPD for complete symptom scoring.  See "scanned report" or Documentation Flowsheet (discrete results - PFTs) for Spirometry results. Patient provided good effort while attempting spirometry.   Lung Age = 89 Albuterol Neb  Lot# [id number]     Exp. 04/10/2022  A/P: Spirometry evaluation reveals possible restrictive lung disease. Post nebulized albuterol tx revealed no significant Pre/Post change.  Patient has taken amiodarone for approximately 1 year.   -Patient referred back to PCP for potential treatment options. -Patient instructed to follow up with [place] if she feels any shortness of breath while on her amiodarone.  Written pt instructions provided.    Total time in face to face counseling 45 minutes.  Patient seen with Gala Murdoch, PharmD Candidate.

## 2021-02-09 NOTE — Patient Instructions (Signed)
Hello [name] [name],  Thank you for coming in. Your lung function looked pretty good today. We recommend coming back in a year to evaluate your lung function again.  Remember, if you ever feel short of breath while taking your amiodarone to let us, Dr. Clent Ridges, or your heart doctor know.  Thanks

## 2021-02-10 ENCOUNTER — Encounter: Payer: Self-pay | Admitting: Cardiology

## 2021-02-10 ENCOUNTER — Ambulatory Visit: Payer: Medicare Other | Admitting: Cardiology

## 2021-02-10 VITALS — BP 153/62 | HR 70 | Temp 97.4°F | Resp 16 | Ht 63.0 in | Wt 176.0 lb

## 2021-02-10 DIAGNOSIS — E782 Mixed hyperlipidemia: Secondary | ICD-10-CM

## 2021-02-10 DIAGNOSIS — Z8673 Personal history of transient ischemic attack (TIA), and cerebral infarction without residual deficits: Secondary | ICD-10-CM

## 2021-02-10 DIAGNOSIS — I10 Essential (primary) hypertension: Secondary | ICD-10-CM

## 2021-02-10 DIAGNOSIS — I480 Paroxysmal atrial fibrillation: Secondary | ICD-10-CM

## 2021-02-10 DIAGNOSIS — Z9889 Other specified postprocedural states: Secondary | ICD-10-CM

## 2021-02-10 DIAGNOSIS — Z794 Long term (current) use of insulin: Secondary | ICD-10-CM

## 2021-02-10 DIAGNOSIS — I5032 Chronic diastolic (congestive) heart failure: Secondary | ICD-10-CM

## 2021-02-10 DIAGNOSIS — E1159 Type 2 diabetes mellitus with other circulatory complications: Secondary | ICD-10-CM

## 2021-02-10 DIAGNOSIS — Z7901 Long term (current) use of anticoagulants: Secondary | ICD-10-CM

## 2021-02-10 DIAGNOSIS — Z79899 Other long term (current) drug therapy: Secondary | ICD-10-CM

## 2021-02-10 NOTE — Progress Notes (Signed)
Date:  02/10/2021   ID:  Manfred Arch, DOB 03/20/1950, MRN [id number]  PCP:  Carollee Leitz, MD  Cardiologist:  Rex Kras, DO, [facility] (established care 08/28/2020) Former Cardiology Providers: Dr. Minus Breeding, Dr. Berniece Salines Advanced heart failure specialist: Dr. Haroldine Laws and Dr. Aundra Dubin Cardiac electrophysiologist: Dr. Caryl Comes  Date: 02/10/21 Last Office Visit: 12/30/2020  Chief Complaint  Patient presents with   heart failure management   Follow-up    HPI  [name] [name] is a 71 y.o. female who presents to the office with a chief complaint of " heart failure management." Patient's past medical history and cardiovascular risk factors include: Recovered cardiomyopathy (prior LVEF less than 20% now 55-60% 09/22), chronic HFpEF, paroxysmal atrial fibrillation s/p TEE/cardioversion on oral anticoagulation, large pericardial effusion, hypertension, hyperlipidemia, insulin-dependent diabetes mellitus type 2, history of embolic stroke requiring tPA and mechanical thrombectomy, history of cardiogenic shock/PEA cardiac arrest immediately following thrombectomy, large left multi loculated extensive pleural effusion, advanced age, postmenopausal female.  She is referred to the office at the request of Carollee Leitz, MD for evaluation of establish care given her chronic heart failure.  In August 2022 patient transition her care from [facility] heart care to [facility] cardiovascular as she did not believe that she has congestive heart failure.  Echocardiogram was repeated which notes improvement in LVEF but she continues to have diastolic dysfunction and encouraged her to uptitrate her GDMT to maximally tolerated doses.  At the last office visit she was started on carvedilol and [place].  Patient states that she did not take this medication as she read the side effects and claims " these medications can kill you."  In the past that she has been intolerant to spironolactone, ARB, Arni due to  hyperkalemia.  Patient has gained approximately 3 pound since last office visit likely due to increased caloric intake.  She denies angina pectoris, orthopnea, paroxysmal nocturnal dyspnea or lower extremity swelling.   FUNCTIONAL STATUS: No structured exercise program or daily routine.   ALLERGIES: Allergies  Allergen Reactions   Amiodarone Shortness Of Breath and Other (See Comments)    Soreness, wheezing, and weakness also Update:  tolerating Amio PO 06/2020   Fruit & Vegetable Daily [Nutritional Supplements] Shortness Of Breath, Swelling and Other (See Comments)    All fruits cause tongue swelling and numbness Organic fruits are okay to eat   Metoprolol Shortness Of Breath, Other (See Comments) and Cough    Soreness, wheezing, and weakness also   Other Shortness Of Breath, Swelling and Other (See Comments)    Tree nuts cause tongue swelling and numbness   Peanut-Containing Drug Products Shortness Of Breath, Swelling and Other (See Comments)    Tongue swelling and numbness   Aspirin Nausea And Vomiting and Other (See Comments)    Cannot take due to liver issues   Lactose Intolerance (Gi) Diarrhea and Nausea And Vomiting    MEDICATION LIST PRIOR TO VISIT: Current Meds  Medication Sig   acetaminophen (TYLENOL) 500 MG tablet Take 500 mg by mouth 3 (three) times daily. Morning and evening   amiodarone (PACERONE) 200 MG tablet Take 0.5 tablets (100 mg total) by mouth daily.   amLODipine (NORVASC) 5 MG tablet Take 1 tablet (5 mg total) by mouth daily.   apixaban (ELIQUIS) 5 MG TABS tablet TAKE 1 TABLET(5 MG) BY MOUTH TWICE DAILY   atorvastatin (LIPITOR) 80 MG tablet TAKE 1 TABLET(80 MG) BY MOUTH DAILY   B-D UF III MINI PEN NEEDLES 31G X 5  MM MISC INJECT WTH LANTUS PEN DAILY AS DIRECTED   Blood Glucose Monitoring Suppl (ONETOUCH VERIO) w/Device KIT Please use to check blood sugar up to three times daily. E11.65   Blood Pressure KIT Monitor blood pressure twice a day   digoxin  (LANOXIN) 0.125 MG tablet TAKE 1 TABLET(0.125 MG) BY MOUTH DAILY   glucose blood (ONETOUCH VERIO) test strip Please use to check blood sugar up to three times daily. E11.65   insulin glargine (LANTUS SOLOSTAR) 100 UNIT/ML Solostar Pen Inject 12 Units into the skin 2 (two) times daily.   isosorbide-hydrALAZINE (BIDIL) 20-37.5 MG tablet TAKE 2 TABLETS BY MOUTH THREE TIMES DAILY   Lancet Devices (ONE TOUCH DELICA LANCING DEV) MISC Please use to check blood sugar up to three times daily. E11.65   Magnesium 400 MG TABS Take 1 tablet by mouth in the morning and at bedtime.   OneTouch Delica Lancets 02O MISC Please use to check blood sugar up to three times daily. E11.65   pantoprazole (PROTONIX) 40 MG tablet TAKE 1 TABLET(40 MG) BY MOUTH DAILY     PAST MEDICAL HISTORY: Past Medical History:  Diagnosis Date   Abdominal pain    Acute respiratory failure with hypoxia (HCC)    Atrial flutter (HCC)    Atrial tachycardia (HCC) 07/29/2019   Back pain    Chronic anticoagulation - on Eliquis for chronic afib 02/04/2020   Chronic combined systolic and diastolic CHF (congestive heart failure) ([place]) 02/04/2020   Chronic heart failure with preserved ejection fraction (HFpEF) ([place]) 03/18/2020   LVEF less than 20% (May 2022), LVEF 55 to 60% (September 2022)   Constipation    COVID-19 02/04/2020   Diabetes mellitus without complication ([place])    Dysphagia 02/03/2020   Epigastric pain 02/03/2020   Fatigue    Full dentures    Hemorrhoids    History of esophageal stricture 07/29/2019   History of noncompliance with medical treatment, presenting hazards to health    Hyperlipidemia    Hypertension    Lack of adequate sleep    Lack of adequate sleep    Liver mass    Nonrheumatic tricuspid valve regurgitation 03/18/2020   Oral thrush 02/04/2020   PAF (paroxysmal atrial fibrillation) (HCC)    Pericardial effusion 05/12/2010   Pleural effusion 06/07/2020   Pleural effusion on left    Rash    on right  breast   Stroke (cerebrum) ([place]) 05/12/2020   Uncontrolled type 2 diabetes mellitus with hyperglycemia ([place]) 06/08/2017   Wears glasses     PAST SURGICAL HISTORY: Past Surgical History:  Procedure Laterality Date   BREAST DUCTAL SYSTEM EXCISION     BREAST EXCISIONAL BIOPSY Left    CARDIOVERSION N/A 07/31/2019   Procedure: CARDIOVERSION;  Surgeon: Pixie Casino, MD;  Location: [place] [facility];  Service: Cardiovascular;  Laterality: N/A;   CARDIOVERSION N/A 05/19/2020   Procedure: CARDIOVERSION;  Surgeon: Jolaine Artist, MD;  Location: [place];  Service: Cardiovascular;  Laterality: N/A;   CHOLECYSTECTOMY     CHOLECYSTECTOMY, LAPAROSCOPIC  07/24/2010   ESOPHAGEAL DILATION     IR CT HEAD LTD  05/12/2020   IR PERCUTANEOUS ART THROMBECTOMY/INFUSION INTRACRANIAL INC DIAG ANGIO  05/12/2020       IR PERCUTANEOUS ART THROMBECTOMY/INFUSION INTRACRANIAL INC DIAG ANGIO  05/12/2020   IR US GUIDE VASC ACCESS RIGHT  05/12/2020   RADIOLOGY WITH ANESTHESIA N/A 05/12/2020   Procedure: IR WITH ANESTHESIA;  Surgeon: Radiologist, Medication, MD;  Location: [place];  Service: Radiology;  Laterality:  N/A;   TEE WITHOUT CARDIOVERSION N/A 07/31/2019   Procedure: TRANSESOPHAGEAL ECHOCARDIOGRAM (TEE);  Surgeon: Pixie Casino, MD;  Location: [facility] END[facility];  Service: Cardiovascular;  Laterality: N/A;   TEE WITHOUT CARDIOVERSION N/A 05/19/2020   Procedure: TRANSESOPHAGEAL ECHOCARDIOGRAM (TEE);  Surgeon: Jolaine Artist, MD;  Location: [facility] ENDOSCOPY;  Service: Cardiovascular;  Laterality: N/A;    FAMILY HISTORY: The patient family history includes Alcohol abuse in an other family member; Diabetes in her sister, sister and another family member; Hypertension in her sister; Multiple sclerosis in her sister; Other in her father; Stroke in her mother.  SOCIAL HISTORY:  The patient  reports that she has never smoked. She has never used smokeless tobacco. She reports that she does not drink alcohol and does not use  drugs.  REVIEW OF SYSTEMS: Review of Systems  Constitutional: Negative for chills, fever and malaise/fatigue.  HENT:  Negative for hoarse voice and nosebleeds.   Eyes:  Negative for discharge, double vision and pain.  Cardiovascular:  Negative for chest pain, claudication, dyspnea on exertion, leg swelling, near-syncope, orthopnea, palpitations, paroxysmal nocturnal dyspnea and syncope.  Respiratory:  Negative for hemoptysis and shortness of breath.   Musculoskeletal:  Negative for muscle cramps and myalgias.  Gastrointestinal:  Negative for abdominal pain, constipation, diarrhea, hematemesis, hematochezia, melena, nausea and vomiting.  Neurological:  Negative for dizziness and light-headedness.   PHYSICAL EXAM: Vitals with BMI 02/10/2021 02/10/2021 02/09/2021  Height - $Remove'5\' 3"'XrjbGiv$  -  Weight - 176 lbs 173 lbs 13 oz  BMI - 24.82 50.03  Systolic 704 888 916  Diastolic 62 68 84  Pulse 70 74 65    CONSTITUTIONAL: Well-developed and well-nourished. No acute distress.  SKIN: Skin is warm and dry. No rash noted. No cyanosis. No pallor. No jaundice HEAD: Normocephalic and atraumatic.  EYES: No scleral icterus MOUTH/THROAT: Moist oral membranes.  NECK: No JVD present. No thyromegaly noted. No carotid bruits  LYMPHATIC: No visible cervical adenopathy.  CHEST Normal respiratory effort. No intercostal retractions  LUNGS: Clear to auscultation bilaterally.  No stridor. No wheezes. No rales.  CARDIOVASCULAR: Regular, positive X4-H0, soft holosystolic murmur heard at the left lower sternal border, no gallops or rubs. ABDOMINAL: Soft, nontender, nondistended, positive bowel sounds in all 4 quadrants, no apparent ascites.  EXTREMITIES: No peripheral edema, warm to touch bilaterally, 2+ bilateral posterior tibial pulses.  CARDIAC DATABASE: EKG: 08/28/2020: Normal sinus rhythm, 74 bpm, diffuse ST-T changes cannot rule out underlying ischemia.  Similar findings on prior EKG dated  06/07/2020.  Echocardiogram: 05/12/20:  LVEF <20%, severely reduced LVEF, global hypokinesis, severe LVH, no LV thrombus visualized, diastolic dysfunction is indeterminate due to atrial fibrillation, RV size mildly dilated, RV systolic function severely reduced RVSP 40.6 mmHg, large circumferential pericardial effusion, IVC dilated and not collapsible, mild MR, moderate to severe TR, trivial AR aortic valve sclerosis without stenosis left atrium mildly dilated.    09/30/2020: 1. Normal LV systolic function with visual EF 55-60%. Left ventricle cavity is normal in size. Moderate left ventricular hypertrophy. Normal global wall motion.Doppler evidence of grade III (restrictive) diastolic dysfunction, elevated LAP. 2. Left atrial cavity is mildly dilated. 3. Mild (Grade I) mitral regurgitation. 4. Mild tricuspid regurgitation. No evidence of pulmonary hypertension. RVSP measures 33 mmHg. 5. Mild pulmonic regurgitation. 6. Small pericardial effusion, located posteriorly, without hemodynamic significance. 7. IVC is normal with a respiratory response of <50%. 8. Compared to study 05/12/2020 LVEF improved from <20% to 55-60%, RV function has improved, pulmonary hypertension has resolved prior RVSP  40.71mmHg and now 67mmHg, pericardial effusion was large and now small in size, Moderate / severe TR is now mild. Otherwise, no significant change.   LABORATORY DATA: CBC Latest Ref Rng & Units 07/23/2020 06/20/2020 06/19/2020  WBC 3.4 - 10.8 x10E3/uL 6.8 - 7.1  Hemoglobin 11.1 - 15.9 g/dL 13.0 9.9(L) 9.9(L)  Hematocrit 34.0 - 46.6 % 43.7 32.9(L) 32.4(L)  Platelets 150 - 450 x10E3/uL 291 - 491(H)    CMP Latest Ref Rng & Units 01/06/2021 12/10/2020 09/10/2020  Glucose 70 - 99 mg/dL 342(H) 231(H) 198(H)  BUN 8 - 27 mg/dL $Remove'21 20 10  'okOinrF$ Creatinine 0.57 - 1.00 mg/dL 1.28(H) 1.20(H) 0.82  Sodium 134 - 144 mmol/L 136 139 142  Potassium 3.5 - 5.2 mmol/L 5.2 5.0 4.3  Chloride 96 - 106 mmol/L 99 99 100  CO2 20 - 29  mmol/L $Remov'24 24 28  'BbVVfw$ Calcium 8.7 - 10.3 mg/dL 9.4 10.1 9.7  Total Protein 6.0 - 8.5 g/dL - 7.2 -  Total Bilirubin 0.0 - 1.2 mg/dL - 0.2 -  Alkaline Phos 44 - 121 IU/L - 81 -  AST 0 - 40 IU/L - 15 -  ALT 0 - 32 IU/L - 30 -    Lipid Panel  Lab Results  Component Value Date   CHOL 87 05/13/2020   HDL 17 (L) 05/13/2020   LDLCALC 61 05/13/2020   TRIG 46 05/13/2020   CHOLHDL 5.1 05/13/2020     No components found for: NTPROBNP Recent Labs    12/10/20 1210 01/06/21 1147  PROBNP 536* 428*   Recent Labs    05/13/20 0527 09/10/20 1543  TSH 1.420 3.080    BMP Recent Labs    03/03/20 1559 03/05/20 1524 03/18/20 1645 06/16/20 0504 06/19/20 0613 06/20/20 0510 07/23/20 1556 09/10/20 1543 12/10/20 1211 01/06/21 1146  NA 138 139   < > 135 134* 134*   < > 142 139 136  K 5.1 4.8   < > 4.3 4.4 4.8   < > 4.3 5.0 5.2  CL 104 102   < > 102 102 100   < > 100 99 99  CO2 21 24   < > $R'29 26 27   'fh$ < > $R'28 24 24  'Dp$ GLUCOSE 161* 197*   < > 92 90 105*   < > 198* 231* 342*  BUN 16 17   < > 11 7* 8   < > $R'10 20 21  'hJ$ CREATININE 0.96 1.08*   < > 0.46 0.64 0.80   < > 0.82 1.20* 1.28*  CALCIUM 9.1 8.9   < > 8.1* 8.5* 8.6*   < > 9.7 10.1 9.4  GFRNONAA 61 53*   < > >60 >60 >60  --   --   --   --   GFRAA 70 61  --   --   --   --   --   --   --   --    < > = values in this interval not displayed.    HEMOGLOBIN A1C Lab Results  Component Value Date   HGBA1C 9.3 (H) 12/10/2020   MPG 254.65 05/13/2020    IMPRESSION:    ICD-10-CM   1. Chronic heart failure with preserved ejection fraction (HFpEF) (HCC)  I50.32     2. Paroxysmal A-fib (HCC)  I48.0     3. History of cardioversion  Z98.890     4. Long term current use of antiarrhythmic drug  Z79.899     5. Long  term (current) use of anticoagulants  Z79.01 Hemoglobin and hematocrit, blood    Basic metabolic panel    6. Benign hypertension  I10     7. Mixed hyperlipidemia  E78.2     8. Type 2 diabetes mellitus with other circulatory  complication, with long-term current use of insulin (HCC)  E11.59    Z79.4     9. Long-term insulin use (HCC)  Z79.4     10. Hx of TIA (transient ischemic attack) and stroke  Z86.73        RECOMMENDATIONS: MCKINZI ERIKSEN is a 71 y.o. female whose past medical history and cardiac risk factors include: Recovered cardiomyopathy (prior LVEF less than 20%), chronic HFpEF, paroxysmal atrial fibrillation s/p TEE/cardioversion on oral anticoagulation, large pericardial effusion, hypertension, hyperlipidemia, insulin-dependent diabetes mellitus type 2, history of embolic stroke requiring tPA and mechanical thrombectomy, history of cardiogenic shock/PEA cardiac arrest immediately following thrombectomy, large left multi loculated extensive pleural effusion, advanced age, postmenopausal female.  Chronic HFpEF (heart failure with preserved ejection fraction) ([place]) Echo May 2022 LVEF <20%, followed echo in 09/2020  LVEF 55-60% with grade 3 diastolic impairment  Past has been intolerant to spironolactone, ARB, Arni due to hyperkalemia. At the last office visit in December 2022 added carvedilol and Farxiga. Patient states that she did not take this medication as she read the side effects and claims " these medications can kill you." Providing care has been challenging as she does not follow recommendations. After the office visit I spoke to her daughter [name] [name] (who works at the heart failure clinic) with regards to my concerns and providing the patient optimal cardiovascular care given her comorbid conditions.  Her daughter states that she has bipolar and the patient does not listen to advice/guidance of her daughters either.   To provide safe and effective care I requested that either one of her daughters accompany her to the office visit so that the recommendations are conveyed clearly, questions are answered, and concerns were addressed.  I am more than happy to help her in the transition if she would  like to change providers within the practice or to a different practice in the meantime. Her daughter was very concerned and thankful for the phone call and the concern given her mother's care.  Paroxysmal A-fib (HCC) Rate control: Digoxin (metoprolol listed as allergies, and patient refuses to be on carvedilol). Rhythm control: Amiodarone Thromboembolic prophylaxis: Eliquis CHA2DS2-VASc SCORE is 7 which correlates to 9.6 % risk of stroke per year (CHF, hypertension, age, diabetes, prior stroke, female) History of TEE guided cardioversion 05/19/2020 Patient and daughter have been educated that amiodarone is off label use for management of paroxysmal atrial fibrillation.  It has a potential to effect multiple organs and therefore she will need follow-up evaluation with regards to thyroid function, liver function, ophthalmological evaluation, PFTs/chest x-ray.  Long-term antiarrhythmic medications: Continue amiodarone to 100 mg p.o. daily TSH within normal limits (08/2020), LFTs within normal limits (07/2020), CXR (12/2020), Eye exam (11/2020) PFT pending.   Long term (current) use of anticoagulants Indication: Paroxysmal atrial fibrillation Reiterated the risks, benefits, and alternatives to oral anticoagulation.  Both patient and daughter are very of the complications of being on oral anticoagulation which has a potential to increase morbidity mortality.  They are aware that if she has an injury despite the mechanism she should be evaluated by going to the closest ER via EMS to rule out intracranial and/or GI bleed.  Mixed hyperlipidemia Continue atorvastatin 80 mg p.o.  nightly. Most recent lipid profile notes an LDL <70 mg/dL Does not endorse myalgias. Continue to monitor  Type 2 diabetes mellitus with other circulatory complication, with long-term current use of insulin ([place]) Educated on the importance of glycemic control. Most recent hemoglobin A1c 9.3% as on Nov 2022 Currently managed  per primary team.  Hx of embolic stroke Status post tPA and mechanical thrombectomy Educated on the importance of secondary prevention.  Total time spent: 38 minutes.  FINAL MEDICATION LIST END OF ENCOUNTER: No orders of the defined types were placed in this encounter.    Current Outpatient Medications:    acetaminophen (TYLENOL) 500 MG tablet, Take 500 mg by mouth 3 (three) times daily. Morning and evening, Disp: , Rfl:    amiodarone (PACERONE) 200 MG tablet, Take 0.5 tablets (100 mg total) by mouth daily., Disp: 90 tablet, Rfl: 0   amLODipine (NORVASC) 5 MG tablet, Take 1 tablet (5 mg total) by mouth daily., Disp: 90 tablet, Rfl: 1   apixaban (ELIQUIS) 5 MG TABS tablet, TAKE 1 TABLET(5 MG) BY MOUTH TWICE DAILY, Disp: 180 tablet, Rfl: 1   atorvastatin (LIPITOR) 80 MG tablet, TAKE 1 TABLET(80 MG) BY MOUTH DAILY, Disp: 90 tablet, Rfl: 1   B-D UF III MINI PEN NEEDLES 31G X 5 MM MISC, INJECT WTH LANTUS PEN DAILY AS DIRECTED, Disp: 100 each, Rfl: 1   Blood Glucose Monitoring Suppl (ONETOUCH VERIO) w/Device KIT, Please use to check blood sugar up to three times daily. E11.65, Disp: 1 kit, Rfl: 0   Blood Pressure KIT, Monitor blood pressure twice a day, Disp: 1 kit, Rfl: 0   digoxin (LANOXIN) 0.125 MG tablet, TAKE 1 TABLET(0.125 MG) BY MOUTH DAILY, Disp: 90 tablet, Rfl: 1   glucose blood (ONETOUCH VERIO) test strip, Please use to check blood sugar up to three times daily. E11.65, Disp: 100 each, Rfl: 12   insulin glargine (LANTUS SOLOSTAR) 100 UNIT/ML Solostar Pen, Inject 12 Units into the skin 2 (two) times daily., Disp: 15 mL, Rfl: 2   isosorbide-hydrALAZINE (BIDIL) 20-37.5 MG tablet, TAKE 2 TABLETS BY MOUTH THREE TIMES DAILY, Disp: 180 tablet, Rfl: 0   Lancet Devices (ONE TOUCH DELICA LANCING DEV) MISC, Please use to check blood sugar up to three times daily. E11.65, Disp: 1 each, Rfl: 0   Magnesium 400 MG TABS, Take 1 tablet by mouth in the morning and at bedtime., Disp: 90 tablet, Rfl: 0    OneTouch Delica Lancets 34V MISC, Please use to check blood sugar up to three times daily. E11.65, Disp: 100 each, Rfl: 12   pantoprazole (PROTONIX) 40 MG tablet, TAKE 1 TABLET(40 MG) BY MOUTH DAILY, Disp: 90 tablet, Rfl: 0   Orders Placed This Encounter  Procedures   Hemoglobin and hematocrit, blood   Basic metabolic panel   There are no Patient Instructions on file for this visit.   --Continue cardiac medications as reconciled in final medication list. --Return in about 6 months (around 08/10/2021) for Follow up, heart failure management.. Or sooner if needed. --Continue follow-up with your primary care physician regarding the management of your other chronic comorbid conditions.  Patient's questions and concerns were addressed to her satisfaction. She voices understanding of the instructions provided during this encounter.   This note was created using a voice recognition software as a result there may be grammatical errors inadvertently enclosed that do not reflect the nature of this encounter. Every attempt is made to correct such errors.  Rex Kras, [place], [facility]  Pager: [phone]  Office: [phone]

## 2021-02-11 ENCOUNTER — Ambulatory Visit (INDEPENDENT_AMBULATORY_CARE_PROVIDER_SITE_OTHER): Payer: Medicare Other | Admitting: Pharmacist

## 2021-02-11 ENCOUNTER — Other Ambulatory Visit: Payer: Self-pay

## 2021-02-11 DIAGNOSIS — E1165 Type 2 diabetes mellitus with hyperglycemia: Secondary | ICD-10-CM | POA: Diagnosis not present

## 2021-02-11 NOTE — Progress Notes (Signed)
Subjective:    Patient ID: [name] [name], female    DOB: [birth year], 71 y.o.   MRN: [id number]  HPI Patient is a 71 y.o. female who presents for diabetes management. She is in good spirits and presents with assistance of walker. Patient was referred on 09/03/20 and last seen by Primary Care Provider on 02/05/21. Last seen in pharmacy clinic on 01/14/21.  Patient reports her blood glucose readings have been good. She states she saw Dr. Volanda Napoleon one week ago and she reported her blood glucose readings to be between 103-154. Her CGM data was not downloaded at that time. No changes to her regimen were made at that time.   Patient reports diabetes was diagnosed around 17 years ago.   Insurance coverage/medication affordability: Medicare/Medicaid, no Part D  Current diabetes medications include: Lantus 12 units twice a day Current hypertension medications include: amlodipine 5mg , isosorbide-hydralazine 20-37.5mg  2 tablets TID Current hyperlipidemia medications include: atorvastatin 80mg  Patient states that She is taking her medications as prescribed. Patient reports adherence with medications.    Do you feel that your medications are working for you?  yes  Have you been experiencing any side effects to the medications prescribed? No   Do you have any problems obtaining medications due to transportation or finances?  no    Patient reports that she loves diet coke without caffeine and drinks water. She has been eating more fruits and veggies.  Patient reports she walks around her apartment complex several times every day.   Patient denies hypoglycemic events.  Patient denies polyuria (increased urination). Patient denies polyphagia (increased appetite). Patient denies polydipsia (increased thirst).  Patient denies neuropathy (nerve pain) Patient denies visual changes. Patient reports self foot exams.  Objective:   CGM 14 day data:    Labs:   Physical Exam Neurological:     Mental  Status: She is alert and oriented to person, place, and time.    Review of Systems  Gastrointestinal:  Negative for nausea and vomiting.   Lab Results  Component Value Date   HGBA1C 9.3 (H) 12/10/2020   HGBA1C 7.6 (A) 07/23/2020   HGBA1C 10.5 (H) 05/13/2020    Lab Results  Component Value Date   MICRALBCREAT 30-300 09/06/2017    Lipid Panel     Component Value Date/Time   CHOL 87 05/13/2020 0527   CHOL 132 06/08/2017 1110   TRIG 46 05/13/2020 0527   HDL 17 (L) 05/13/2020 0527   HDL 35 (L) 06/08/2017 1110   CHOLHDL 5.1 05/13/2020 0527   VLDL 9 05/13/2020 0527   LDLCALC 61 05/13/2020 0527   LDLCALC 67 06/08/2017 1110    Clinical Atherosclerotic Cardiovascular Disease (ASCVD): Yes  The ASCVD Risk score (Arnett DK, et al., 2019) failed to calculate for the following reasons:   The patient has a prior MI or stroke diagnosis    Assessment/Plan:   T2DM is not controlled likely due to apprehension in therapy changes and patient's recall of blood glucose readings varying drastically from CGM data. Patient has been wearing CGM data for one month now and since that time she has almost always been hyperglycemic. Discussed these findings with patient and she feels CGM is inaccurate and that her blood glucose is well controlled. Discussed extensively with patient CGM results and their accuracy and at this time believe that patient is very hyperglycemic. Patient unwilling to add on additional medication but is agreeable to increasing Lantus. Previously discussed SGLT2 therapy with patient to which she was apprehensive,  but when glucose readings improve may revisit discussion. Patient previously tolerating GLP1 well (Trulicity and Ozempic) but at last visit stated Ozempic caused her to develop a lump in her throat that she did not tell anyone about, but after stopping Ozempic the lump went away. At this time will increase Lantus to 15 units twice daily. Patient prefers twice daily dosing.  Following instruction patient verbalized understanding of treatment plan.   Increase dose of basal insulin Lantus to 15 units twice daily (30 units total) Extensively discussed pathophysiology of diabetes, dietary effects on blood sugar control, and recommended lifestyle interventions. Counseled on s/sx of and management of hypoglycemia Next A1C anticipated at next appointment  ASCVD risk - secondary prevention in patient with diabetes. Last LDL is controlled.   Continued atorvastatin 80 mg.   Follow-up appointment four weeks to review sugar readings. Written patient instructions provided.  This appointment required 30 minutes of direct patient care.  Thank you for involving pharmacy to assist in providing this patient's care.

## 2021-02-11 NOTE — Assessment & Plan Note (Signed)
T2DM is not controlled likely due to apprehension in therapy changes and patient's recall of blood glucose readings varying drastically from CGM data. Patient has been wearing CGM data for one month now and since that time she has almost always been hyperglycemic. Discussed these findings with patient and she feels CGM is inaccurate and that her blood glucose is well controlled. Discussed extensively with patient CGM results and their accuracy and at this time believe that patient is very hyperglycemic. Patient unwilling to add on additional medication but is agreeable to increasing Lantus. Previously discussed SGLT2 therapy with patient to which she was apprehensive, but when glucose readings improve may revisit discussion. Patient previously tolerating GLP1 well (Trulicity and Ozempic) but at last visit stated Ozempic caused her to develop a lump in her throat that she did not tell anyone about, but after stopping Ozempic the lump went away. At this time will increase Lantus to 15 units twice daily. Patient prefers twice daily dosing. Following instruction patient verbalized understanding of treatment plan.   1. Increase dose of basal insulin Lantus to 15 units twice daily (30 units total) 2. Extensively discussed pathophysiology of diabetes, dietary effects on blood sugar control, and recommended lifestyle interventions. 3. Counseled on s/sx of and management of hypoglycemia 4. Next A1C anticipated at next appointment

## 2021-02-11 NOTE — Patient Instructions (Signed)
[name] [name] it was a pleasure seeing you today.   Please do the following:  Increase your Lantus to 15 units twice a day as directed today during your appointment. If you have any questions or if you believe something has occurred because of this change, call me or your doctor to let one of [place] know.  Continue checking blood sugars at home. It's really important that you record these and bring these in to your next doctor's appointment.  Continue making the lifestyle changes we've discussed together during our visit. Diet and exercise play a significant role in improving your blood sugars.  Follow-up with Dr. Valentina Lucks on 2/1  Hypoglycemia or low blood sugar:   Low blood sugar can happen quickly and may become an emergency if not treated right away.   While this shouldn't happen often, it can be brought upon if you skip a meal or do not eat enough. Also, if your insulin or other diabetes medications are dosed too high, this can cause your blood sugar to go to low.   Warning signs of low blood sugar include: Feeling shaky or dizzy Feeling weak or tired  Excessive hunger Feeling anxious or upset  Sweating even when you aren't exercising  What to do if I experience low blood sugar? Follow the Rule of 15 Check your blood sugar with your meter. If lower than 70, proceed to step 2.  Treat with 15 grams of fast acting carbs which is found in 3-4 glucose tablets. If none are available you can try hard candy, 1 tablespoon of sugar or honey,4 ounces of fruit juice, or 6 ounces of REGULAR soda.  Re-check your sugar in 15 minutes. If it is still below 70, do what you did in step 2 again. If your blood sugar has come back up, go ahead and eat a snack or small meal made up of complex carbs (ex. Whole grains) and protein at this time to avoid recurrence of low blood sugar.

## 2021-02-19 ENCOUNTER — Other Ambulatory Visit: Payer: Self-pay | Admitting: Family Medicine

## 2021-03-04 ENCOUNTER — Other Ambulatory Visit: Payer: Self-pay | Admitting: Family Medicine

## 2021-03-04 ENCOUNTER — Ambulatory Visit
Admission: RE | Admit: 2021-03-04 | Discharge: 2021-03-04 | Disposition: A | Discharge status: Home / Self Care | Payer: Medicare Other | Source: Ambulatory Visit | Attending: Family Medicine | Admitting: Family Medicine

## 2021-03-04 DIAGNOSIS — R9389 Abnormal findings on diagnostic imaging of other specified body structures: Secondary | ICD-10-CM

## 2021-03-09 ENCOUNTER — Other Ambulatory Visit: Payer: Self-pay

## 2021-03-12 ENCOUNTER — Encounter: Payer: Self-pay | Admitting: Pharmacist

## 2021-03-12 ENCOUNTER — Ambulatory Visit (INDEPENDENT_AMBULATORY_CARE_PROVIDER_SITE_OTHER): Payer: Medicare Other | Admitting: Pharmacist

## 2021-03-12 ENCOUNTER — Other Ambulatory Visit: Payer: Medicare Other

## 2021-03-12 ENCOUNTER — Other Ambulatory Visit: Payer: Self-pay

## 2021-03-12 DIAGNOSIS — E1165 Type 2 diabetes mellitus with hyperglycemia: Secondary | ICD-10-CM

## 2021-03-12 DIAGNOSIS — M8580 Other specified disorders of bone density and structure, unspecified site: Secondary | ICD-10-CM

## 2021-03-12 MED ORDER — LANTUS SOLOSTAR 100 UNIT/ML ~~LOC~~ SOPN: 14.0000 [IU] | PEN_INJECTOR | Freq: Two times a day (BID) | SUBCUTANEOUS | 2 refills | Status: DC

## 2021-03-12 NOTE — Assessment & Plan Note (Signed)
Diabetes longstanding currently under poor control. Patient is able to verbalize appropriate hypoglycemia management plan. Medication adherence appears good. Control is suboptimal due to suboptimal insulin dose.  Patient unwilling to reconsider use of Ozempic (semaglutide) due to side effect of throat/lump/swelling with previous exposure ?-Increased dose of basal insulin Lantus (insulin glargine) from 12 to 14 units BID. ?

## 2021-03-12 NOTE — Progress Notes (Signed)
Reviewed: I agree with Dr. Koval's documentation and management. 

## 2021-03-12 NOTE — Patient Instructions (Signed)
Nice to see you today.  ? ?Keep up the great work with your exercise.  ? ?Increase your Lantus to 14 units TWICE daily.  ? ? ?

## 2021-03-12 NOTE — Progress Notes (Signed)
? ? ?S:    ? ?Chief Complaint  ?Patient presents with  ? Medication Management  ?  Diabetes  ? ? ?[name] [name] is a 71 y.o. female who presents for diabetes evaluation, education, and management. PMH is significant for CHF. Patient was referred and last seen by Primary Care Provider, Dr. Clent Ridges, on 02/05/2021 and was seen by Dr. Cheral Almas, PharmD 02/11/2021.  ? ?Today, She arrives in good spirits and presents with assistance of a rolling walker.  ? ?Current diabetes medications include: Lantus (insulin glargine) 12 units BID ? ?Patient reports taking medications as prescribed.  ?Do you feel that your medications are working for you? yes ?Have you been experiencing any side effects to the medications prescribed? no ?Do you have any problems obtaining medications due to transportation or finances? Yes, reports losing transportation support next month.  ? ?Insurance coverage:  ?Patient denies hypoglycemic events. ? ?Date of Download: 3/2 ?% Time CGM is active: 84% ?Average Glucose: 340 mg/dL ?Glucose Management Indicator: 11.4  ?Glucose Variability: 15.8 (goal <36%) ?Time in Goal:  ?- Time in range 70-180: 0% ?- Time above range: 100%% ?- Time below range: 0% ?Observed patterns: Consistently elevated> 250 ? ?Patient reports nocturia (nighttime urination 1-2 times per night.  ? ?Patient-reported exercise habits: daily 4-5 miles per day (~1 hour in duration) ? ? ?O:  ?Physical Exam ?Constitutional:   ?   General: She is not in acute distress. ?   Appearance: Normal appearance.  ?Pulmonary:  ?   Effort: Pulmonary effort is normal.  ?Musculoskeletal:  ?   Right lower leg: No edema.  ?   Left lower leg: No edema.  ?Neurological:  ?   Mental Status: She is alert.  ?Psychiatric:     ?   Mood and Affect: Mood normal.     ?   Behavior: Behavior normal.  ? ? ?Review of Systems  ?Genitourinary:  Positive for frequency (nocturia 1-2 x per night).  ?All other systems reviewed and are negative. ? ? ?Lab Results  ?Component  Value Date  ? HGBA1C 9.3 (H) 12/10/2020  ? ?Vitals:  ? 03/12/21 1121  ?BP: (!) 142/60  ?Pulse: 69  ?SpO2: 96%  ? ? ?Lipid Panel  ?   ?Component Value Date/Time  ? CHOL 87 05/13/2020 0527  ? CHOL 132 06/08/2017 1110  ? TRIG 46 05/13/2020 0527  ? HDL 17 (L) 05/13/2020 0527  ? HDL 35 (L) 06/08/2017 1110  ? CHOLHDL 5.1 05/13/2020 0527  ? VLDL 9 05/13/2020 0527  ? LDLCALC 61 05/13/2020 0527  ? LDLCALC 67 06/08/2017 1110  ? ? ? ?A/P: ?Diabetes longstanding currently under poor control. Patient is able to verbalize appropriate hypoglycemia management plan. Medication adherence appears good. Control is suboptimal due to suboptimal insulin dose.  Patient unwilling to reconsider use of Ozempic (semaglutide) due to side effect of throat/lump/swelling with previous exposure ?-Increased dose of basal insulin Lantus (insulin glargine) from 12 to 14 units BID. ?-Patient educated on purpose, proper use, and potential adverse effects.  ?-Extensively discussed pathophysiology of diabetes, recommended lifestyle interventions, dietary effects on blood sugar control.  ?-Counseled on s/sx of and management of hypoglycemia.  ?-Next A1c anticipated in 2-3 months.  ? ?Written patient instructions provided. Patient verbalized understanding of treatment plan. Total time in face to face counseling 34 minutes.   ? ?Follow up pharmacist 1 month for glucose reevaluation . Asked to bring CGM. Patient seen with Bartolo Darter PharmD Candidate and Lissa Merlin, PharmD, PGY  1 pharmacy resident.  ?. ? ?

## 2021-03-13 LAB — BASIC METABOLIC PANEL
BUN/Creatinine Ratio: 19 (ref 12–28)
BUN: 20 mg/dL (ref 8–27)
CO2: 25 mmol/L (ref 20–29)
Calcium: 9.8 mg/dL (ref 8.7–10.3)
Chloride: 102 mmol/L (ref 96–106)
Creatinine, Ser: 1.07 mg/dL — ABNORMAL HIGH (ref 0.57–1.00)
Glucose: 368 mg/dL — ABNORMAL HIGH (ref 70–99)
Potassium: 4.7 mmol/L (ref 3.5–5.2)
Sodium: 141 mmol/L (ref 134–144)
eGFR: 56 mL/min/{1.73_m2} — ABNORMAL LOW (ref >59)

## 2021-03-13 LAB — HEMOGLOBIN AND HEMATOCRIT, BLOOD
Hematocrit: 39.5 % (ref 34.0–46.6)
Hemoglobin: 12.1 g/dL (ref 11.1–15.9)

## 2021-03-13 MED ORDER — DIGOXIN 125 MCG PO TABS: ORAL_TABLET | ORAL | 1 refills | Status: DC

## 2021-03-16 ENCOUNTER — Other Ambulatory Visit: Payer: Self-pay | Admitting: Family Medicine

## 2021-03-16 ENCOUNTER — Other Ambulatory Visit (HOSPITAL_COMMUNITY): Payer: Self-pay

## 2021-03-17 ENCOUNTER — Encounter: Payer: Self-pay | Admitting: Family Medicine

## 2021-03-19 ENCOUNTER — Other Ambulatory Visit: Payer: Self-pay | Admitting: Nurse Practitioner

## 2021-03-19 DIAGNOSIS — Z1231 Encounter for screening mammogram for malignant neoplasm of breast: Secondary | ICD-10-CM

## 2021-03-20 ENCOUNTER — Telehealth: Payer: Self-pay

## 2021-03-20 NOTE — Telephone Encounter (Signed)
Patient calls nurse line regarding request for assistance with transportation for upcoming mammogram on 3/14. Advised patient that [facility] only provides rides for offices in [facility] network.  ? ?Patient became upset stating that we provided an "override" in the past for her to go to [facility]. I recommended that patient contact insurance company, as they often are able to assist with transportation services. Patient still upset stating that "[place] provides my rides not [facility], I'm tired of getting the run around and I should be able to get a ride."  ? ?Patient may benefit from CCM referral to help her facilitate transportation services outside of [facility].  ? ?Forwarding to PCP.  ? ?Veronda Prude, RN ? ? ? ?

## 2021-03-24 ENCOUNTER — Ambulatory Visit
Admission: RE | Admit: 2021-03-24 | Discharge: 2021-03-24 | Disposition: A | Discharge status: Home / Self Care | Payer: Medicare Other | Source: Ambulatory Visit | Attending: Nurse Practitioner | Admitting: Nurse Practitioner

## 2021-03-24 ENCOUNTER — Other Ambulatory Visit: Payer: Self-pay

## 2021-03-24 DIAGNOSIS — Z1231 Encounter for screening mammogram for malignant neoplasm of breast: Secondary | ICD-10-CM

## 2021-03-26 ENCOUNTER — Telehealth: Payer: Self-pay | Admitting: *Deleted

## 2021-03-26 NOTE — Telephone Encounter (Signed)
Received request for pts Digoxin, looks like you filled but it was set to print, please resend so that it goes electronically.Annison Birchard Zimmerman Rumple, CMA ? ?

## 2021-03-27 ENCOUNTER — Other Ambulatory Visit: Payer: Self-pay | Admitting: Family Medicine

## 2021-03-27 MED ORDER — DIGOXIN 125 MCG PO TABS: ORAL_TABLET | ORAL | 1 refills | Status: DC

## 2021-03-27 NOTE — Telephone Encounter (Signed)
Resent prescription.  Please let patient know that she needs to have her Cardiologist refill her Amiodarone and Digoxin. ? ?Thanks ?Carollee Leitz, MD ?Family Medicine Residency  ?

## 2021-03-27 NOTE — Telephone Encounter (Signed)
I would favor beta blockers as oppose to digoxin given the possibility of dig toxicity. Her GDMT needs to be uptitrated but providing her care has been difficult and I have expressed my concerns to her and her daughters multiple times. Amiodarone 100mg  po qday is acceptable.  ? ?Regards,  ? ?Rocio Wolak

## 2021-03-27 NOTE — Telephone Encounter (Signed)
Contacted pt and informed her of below and she said that because she would not take a new medicine that the cardiologist wanted her to take due to side effects she said that they told her they were not going to refill her medications. Routing to PCP as an Financial planner. Rayana Geurin Zimmerman Rumple, CMA ? ?

## 2021-03-30 ENCOUNTER — Other Ambulatory Visit: Payer: Self-pay | Admitting: Family Medicine

## 2021-03-30 DIAGNOSIS — I5042 Chronic combined systolic (congestive) and diastolic (congestive) heart failure: Secondary | ICD-10-CM

## 2021-04-06 ENCOUNTER — Telehealth: Payer: Self-pay

## 2021-04-06 NOTE — Telephone Encounter (Signed)
-----   Message from Dana Allan, MD sent at 04/05/2021  9:40 AM EDT ----- ?Yes that would be fine. ? ?Thank you ?----- Message ----- ?From: Sunday Spillers, CMA ?Sent: 04/03/2021   2:24 PM EDT ?To: Dana Allan, MD, [facility] ? ?Pt has an appt on 3/30. Can she have the labs drawn then? ?Shari ?----- Message ----- ?From: Dana Allan, MD ?Sent: 03/30/2021   6:08 PM EDT ?To: [facility] ? ?Please schedule patient for future lab work.  Order already placed.  Need to check Digoxin level.  She will need to schedule appointment after lab has been drawn to discuss medication and if needing to continue. ? ?Thanks ? ?Dana Allan, MD ?Family Medicine Residency  ?  ? ? ? ?

## 2021-04-06 NOTE — Telephone Encounter (Signed)
Spoke to pt. Pt after appropriate verification. Pt agreed to have labs drawn while she is here to see Dr. Raymondo Band. Pt will also schedule an appt to see Dr. Clent Ridges the first week of April. Sunday Spillers, CMA ? ?

## 2021-04-09 ENCOUNTER — Other Ambulatory Visit: Payer: Medicare Other

## 2021-04-09 ENCOUNTER — Ambulatory Visit (INDEPENDENT_AMBULATORY_CARE_PROVIDER_SITE_OTHER): Payer: Medicare Other | Admitting: Pharmacist

## 2021-04-09 ENCOUNTER — Encounter: Payer: Self-pay | Admitting: Pharmacist

## 2021-04-09 DIAGNOSIS — I10 Essential (primary) hypertension: Secondary | ICD-10-CM

## 2021-04-09 DIAGNOSIS — E119 Type 2 diabetes mellitus without complications: Secondary | ICD-10-CM | POA: Diagnosis not present

## 2021-04-09 DIAGNOSIS — E1165 Type 2 diabetes mellitus with hyperglycemia: Secondary | ICD-10-CM | POA: Diagnosis not present

## 2021-04-09 DIAGNOSIS — I5042 Chronic combined systolic (congestive) and diastolic (congestive) heart failure: Secondary | ICD-10-CM

## 2021-04-09 MED ORDER — LANTUS SOLOSTAR 100 UNIT/ML ~~LOC~~ SOPN: 20.0000 [IU] | PEN_INJECTOR | Freq: Two times a day (BID) | SUBCUTANEOUS | 2 refills | Status: DC

## 2021-04-09 NOTE — Patient Instructions (Addendum)
Nice to see you today! Keep up the great work with exercise. ? ?We are increasing your Lantus (insulin glargine) dose to 20 units twice daily. ? ?Start taking Bidil (isosorbide-hydralazine) 20-37.5 mg 2 tablets 3 times daily. ? ?You will follow up with Dr. Clent Ridges in 1 month. ?

## 2021-04-09 NOTE — Assessment & Plan Note (Signed)
Hypertension longstanding currently uncontrolled. Blood pressure goal of <130/80 mmHg. Medication adherence sub-optimal. Blood pressure control is suboptimal due to medication non-adherence. ?-Re-initiated isosorbide-hydralazine (Bidil) 20-37.5 mg tablet ?

## 2021-04-09 NOTE — Assessment & Plan Note (Signed)
Diabetes longstanding currently very uncontrolled. Patient is able to verbalize appropriate hypoglycemia management plan. Medication adherence appears sub-optimal. Control is suboptimal due to non-optimized medication regimen and patient hesitancy with medications. ?-Increased dose of basal insulin Lantus (insulin glargine) to 20 units BID.  ?-Patient educated on purpose, proper use, and potential adverse effects of hypoglycemia.  ?-Extensively discussed pathophysiology of diabetes. Consequences of long-term high blood sugar with diabetes, recommended lifestyle interventions, dietary effects on blood sugar control.  ?-Counseled on s/sx of and management of hypoglycemia.  ?-Next A1c anticipated 04/2021.  ? ?

## 2021-04-09 NOTE — Progress Notes (Signed)
? ? ?S:    ? ?Chief Complaint  ?Patient presents with  ? Medication Management  ?  Diabetes f/u  ? ? ?[name] [name] is a 71 y.o. female who presents for diabetes evaluation, education, and management. PMH is significant for CHF, diabetes. Patient was referred and last seen by Primary Care Provider, Dr. Clent Ridges, on 02/02/21 and was last seen by Dr. Raymondo Band, PharmD on 03/12/21. At last visit, Lantus (insulin glargine) to 14 units BID.  ? ?Today, She arrives in good spirits and presents with assistance of a walker.  ? ?Current diabetes medications include: Lantus (insulin glargine) 14 units BID ?Current hypertension medications include: amlodipine 5 mg daily ?Current hyperlipidemia medications include: atorvastatin 80 mg daily ? ?Patient states that She is taking her medications as prescribed. Patient reports adherence with medications. ? ?Patient reports hypoglycemic events "every once in a while." Patient reports getting sweats. ? ?Patient reports seeing blood sugars ~150 with blood glucose meters.  ? ?Date of Download: 04/09/21 ?% Time CGM is active: 83% ?Average Glucose: 347 mg/dL ?Glucose Management Indicator: 11.6%  ?Glucose Variability: 14.1% (goal <36%) ?Time in Goal:  ?- Time in range 70-180: 0% ?- Time above range: 100% ?- Time below range: 0% ?Observed patterns: Patient is consistently elevated with 97% of time > 250 mg/dL ? ?Patient reports nocturia (nighttime urination) 1-2x per night.  ?Patient denies neuropathy (nerve pain). ?Patient denies visual changes. ?Patient reports self foot exams.  ? ?Patient reported dietary habits: Eats 3 meals/day ?Breakfast: eggs ?Lunch: vegetables, fish ?Dinner: meatballs, green beans ?Snacks: apple sauce, crackers and peanut butter ?Drinks: water, coffee in the morning  ? ?Patient reports constantly eating to keep blood sugar > 70.  ? ?Patient-reported exercise habits: elliptical/skier 20 minutes/day 7 days ? ?O:  ?Physical Exam ?Constitutional:   ?   Appearance: Normal  appearance. She is normal weight.  ?Pulmonary:  ?   Effort: Pulmonary effort is normal.  ?Neurological:  ?   Mental Status: She is alert.  ?Psychiatric:     ?   Mood and Affect: Mood normal.     ?   Behavior: Behavior normal.     ?   Thought Content: Thought content normal.     ?   Judgment: Judgment normal.  ? ? ?Review of Systems  ?All other systems reviewed and are negative. ? ? ? ?Lab Results  ?Component Value Date  ? HGBA1C 9.3 (H) 12/10/2020  ? ?Vitals:  ? 04/09/21 1053  ?BP: (!) 160/73  ?Pulse: 67  ?SpO2: 100%  ? ? ?Lipid Panel  ?   ?Component Value Date/Time  ? CHOL 87 05/13/2020 0527  ? CHOL 132 06/08/2017 1110  ? TRIG 46 05/13/2020 0527  ? HDL 17 (L) 05/13/2020 0527  ? HDL 35 (L) 06/08/2017 1110  ? CHOLHDL 5.1 05/13/2020 0527  ? VLDL 9 05/13/2020 0527  ? LDLCALC 61 05/13/2020 0527  ? LDLCALC 67 06/08/2017 1110  ? ? ?A/P: ?Diabetes longstanding currently very uncontrolled. Patient is able to verbalize appropriate hypoglycemia management plan. Medication adherence appears sub-optimal. Control is suboptimal due to non-optimized medication regimen and patient hesitancy with medications. ?-Increased dose of basal insulin Lantus (insulin glargine) to 20 units BID.  ?-Patient educated on purpose, proper use, and potential adverse effects of hypoglycemia.  ?-Extensively discussed pathophysiology of diabetes. Consequences of long-term high blood sugar with diabetes, recommended lifestyle interventions, dietary effects on blood sugar control.  ?-Counseled on s/sx of and management of hypoglycemia.  ?-Next A1c anticipated 04/2021.  ? ?  Hypertension longstanding currently uncontrolled. Blood pressure goal of <130/80 mmHg. Medication adherence sub-optimal. Blood pressure control is suboptimal due to medication non-adherence. ?-Re-initiated isosorbide-hydralazine (Bidil) 20-37.5 mg tablet ? ?Written patient instructions provided. Patient verbalized understanding of treatment plan. Total time in face to face counseling 32  minutes.   ? ?Follow up PCP clinic visit in 1 month.  ?Then return to Rx clinic in 6-8 weeks from today. (Make visit at time of PCP visit).  ?Patient seen with Bartolo Darter PharmD Candidate, Lissa Merlin, PharmD, PGY 1 Pharmacy Resident and Pervis Hocking, PharmD - PGY2 Pharmacy Resident.  ? ?

## 2021-04-09 NOTE — Assessment & Plan Note (Signed)
Diabetes longstanding currently very uncontrolled. Patient is able to verbalize appropriate hypoglycemia management plan. Medication adherence appears sub-optimal. Control is suboptimal due to non-optimized medication regimen and patient hesitancy with medications. ?-Increased dose of basal insulin Lantus (insulin glargine) to 20 units BID.  ?-Patient educated on purpose, proper use, and potential adverse effects of hypoglycemia.  ?-Extensively discussed pathophysiology of diabetes. Consequences of long-term high blood sugar with diabetes, recommended lifestyle interventions, dietary effects on blood sugar control.  ?-Counseled on s/sx of and management of hypoglycemia.  ?-Next A1c anticipated 04/2021.  ? ?

## 2021-04-09 NOTE — Progress Notes (Signed)
Reviewed: I agree with Dr. Koval's documentation and management. 

## 2021-05-07 ENCOUNTER — Telehealth: Payer: Self-pay
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
## 2021-05-11 ENCOUNTER — Encounter: Payer: Self-pay | Admitting: Family Medicine

## 2021-05-11 ENCOUNTER — Ambulatory Visit (INDEPENDENT_AMBULATORY_CARE_PROVIDER_SITE_OTHER): Payer: Medicare Other | Admitting: Family Medicine

## 2021-05-11 VITALS — BP 150/83 | HR 73 | Ht 63.0 in | Wt 181.6 lb

## 2021-05-11 DIAGNOSIS — I10 Essential (primary) hypertension: Secondary | ICD-10-CM | POA: Diagnosis not present

## 2021-05-11 DIAGNOSIS — I5042 Chronic combined systolic (congestive) and diastolic (congestive) heart failure: Secondary | ICD-10-CM

## 2021-05-11 DIAGNOSIS — E1165 Type 2 diabetes mellitus with hyperglycemia: Secondary | ICD-10-CM

## 2021-05-11 DIAGNOSIS — Z77120 Contact with and (suspected) exposure to mold (toxic): Secondary | ICD-10-CM | POA: Diagnosis not present

## 2021-05-11 LAB — POCT GLYCOSYLATED HEMOGLOBIN (HGB A1C): HbA1c, POC (controlled diabetic range): 12.5 % — AB (ref 0.0–7.0)

## 2021-05-11 MED ORDER — ISOSORB DINITRATE-HYDRALAZINE 20-37.5 MG PO TABS: 2.0000 | ORAL_TABLET | Freq: Three times a day (TID) | ORAL | 2 refills | Status: DC

## 2021-05-11 MED ORDER — LANTUS SOLOSTAR 100 UNIT/ML ~~LOC~~ SOPN
20.0000 [IU] | PEN_INJECTOR | Freq: Two times a day (BID) | SUBCUTANEOUS | 2 refills | Status: DC
Start: 2021-05-11 — End: 2021-05-11

## 2021-05-11 MED ORDER — LANTUS SOLOSTAR 100 UNIT/ML ~~LOC~~ SOPN: 25.0000 [IU] | PEN_INJECTOR | Freq: Two times a day (BID) | SUBCUTANEOUS | 2 refills | Status: DC

## 2021-05-11 NOTE — Patient Instructions (Addendum)
Thank you for coming to see me today. It was a pleasure.  ? ?Restart Bidil today.   ? ?Refill Lantus, increase to 25 units twice a day.  Check your blood glucose daily. ?Refill Bidil ? ?We will get some labs today.  If they are abnormal or we need to do something about them, I will call you.  If they are normal, I will send you a message on MyChart (if it is active) or a letter in the mail.  If you don't hear from [place] in 2 weeks, please call the office at the number below.  ? ?You should pay attention to your hemoglobin A1C.  It is a three month test about your average blood sugar. ?If the A1C is ?- <7.0 is great.  That is our goal for treating you. ?- Between 7.0 and 9.0 is not so good.  We would need to work to do better. ?- Above 9.0 is terrible.  You would really need to work with [place] to get it under control.   ? ?Today's A1C = 12.5 ? ? ?Please follow-up with PCP in 2 weeks ? ?If you have any questions or concerns, please do not hesitate to call the office at [phone]. ? ?Best,  ? ?Carollee Leitz, MD   ?
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
## 2021-05-12 ENCOUNTER — Other Ambulatory Visit: Payer: Self-pay | Admitting: *Deleted

## 2021-05-12 LAB — BASIC METABOLIC PANEL
BUN/Creatinine Ratio: 17 (ref 12–28)
BUN: 18 mg/dL (ref 8–27)
CO2: 24 mmol/L (ref 20–29)
Calcium: 9.7 mg/dL (ref 8.7–10.3)
Chloride: 101 mmol/L (ref 96–106)
Creatinine, Ser: 1.03 mg/dL — ABNORMAL HIGH (ref 0.57–1.00)
Glucose: 275 mg/dL — ABNORMAL HIGH (ref 70–99)
Potassium: 4.3 mmol/L (ref 3.5–5.2)
Sodium: 140 mmol/L (ref 134–144)
eGFR: 59 mL/min/{1.73_m2} — ABNORMAL LOW (ref >59)

## 2021-05-12 LAB — LIPID PANEL
Chol/HDL Ratio: 3.6 ratio (ref 0.0–4.4)
Cholesterol, Total: 147 mg/dL (ref 100–199)
HDL: 41 mg/dL (ref >39)
LDL Chol Calc (NIH): 84 mg/dL (ref 0–99)
Triglycerides: 125 mg/dL (ref 0–149)
VLDL Cholesterol Cal: 22 mg/dL (ref 5–40)

## 2021-05-12 LAB — MAGNESIUM: Magnesium: 1.7 mg/dL (ref 1.6–2.3)

## 2021-05-12 LAB — DIGOXIN LEVEL: Digoxin, Serum: 0.8 ng/mL (ref 0.5–0.9)

## 2021-05-12 MED ORDER — ONETOUCH VERIO VI STRP: ORAL_STRIP | 12 refills | Status: DC

## 2021-05-13 ENCOUNTER — Other Ambulatory Visit: Payer: Self-pay

## 2021-05-13 MED ORDER — BD PEN NEEDLE MINI U/F 31G X 5 MM MISC: 1 refills | Status: DC

## 2021-05-13 NOTE — Telephone Encounter (Signed)
Patient calls nurse line checking on the status of letter for housing. ? ?Patient reports she was seen on 5/1 to discuss.  ? ?Will forward to PCP.  ?

## 2021-05-13 NOTE — Telephone Encounter (Signed)
Patient calls nurse line requesting an updated prescription for pen needles.  ? ?Patient does have 1 refill on March prescription, however she uses 2 per day. Therefore, insurance is flagging too soon to fill.  ? ?Please update sig on pen needles and resend.  ?

## 2021-05-14 ENCOUNTER — Encounter: Payer: Self-pay | Admitting: Family Medicine

## 2021-05-14 DIAGNOSIS — Z77120 Contact with and (suspected) exposure to mold (toxic): Secondary | ICD-10-CM | POA: Insufficient documentation

## 2021-05-14 NOTE — Assessment & Plan Note (Signed)
Patient aware that she needs to follow up with Cardiology for Amiodarone and Digoxin refills ?Digoxin level today ?

## 2021-05-14 NOTE — Assessment & Plan Note (Signed)
Unsure if building has been tested.  Is currently asymptomatic and exam bening ?Letter for housing provided ?

## 2021-05-14 NOTE — Assessment & Plan Note (Signed)
A1c 12.5 ?Increase Lantus to 25 u BID ?Would benefit from SGLT2, will discuss at next visit in 2 weeks ?Patient is reluctant to start new medications due to sensitivities ?

## 2021-05-14 NOTE — Assessment & Plan Note (Signed)
Elevated again at repeat check. ?Refill Bidil ?BMet ?Follow up in 2 weeks ?

## 2021-05-25 ENCOUNTER — Ambulatory Visit (INDEPENDENT_AMBULATORY_CARE_PROVIDER_SITE_OTHER): Payer: Medicare Other | Admitting: Family Medicine

## 2021-05-25 ENCOUNTER — Encounter: Payer: Self-pay | Admitting: Family Medicine

## 2021-05-25 VITALS — BP 146/74 | HR 84 | Wt 187.0 lb

## 2021-05-25 DIAGNOSIS — E1165 Type 2 diabetes mellitus with hyperglycemia: Secondary | ICD-10-CM

## 2021-05-25 DIAGNOSIS — I10 Essential (primary) hypertension: Secondary | ICD-10-CM

## 2021-05-25 MED ORDER — AMLODIPINE BESYLATE 10 MG PO TABS: 10.0000 mg | ORAL_TABLET | Freq: Every day | ORAL | 0 refills | Status: DC

## 2021-05-25 NOTE — Progress Notes (Signed)
    SUBJECTIVE:   CHIEF COMPLAINT / HPI: follow up blood pressure  Presents for follow up for elevated blood pressure. Seen in clinic on 05/01 and refill of Bidil sent.  Since then patient reports home BP 140/60's-70's.  Denies any headaches, visual changes, chest pain, worsening shortness of breath or lower extremity edema.   DM Type 2 CBG's 103->300's.  Takes Lantus mid day and dinner.  Reports that her Josephine Igo sensor may not be working as she doesn't fell her sugars are as high as what is reading on the monitor.  Denies any polyuria, abdominal pain.  PERTINENT  PMH / PSH:  HTN DM Type 2 Afib on Eliquis Combined CHF HLD  OBJECTIVE:   BP (!) 146/74   Pulse 84   Wt 187 lb (84.8 kg)   SpO2 98%   BMI 33.13 kg/m    General: Alert, no acute distress Cardio: Normal S1 and S2, RRR, no r/m/g Pulm: CTAB, normal work of breathing Extremities: No peripheral edema.   ASSESSMENT/PLAN:   Hypertension Not at goal Increase Amlodipine to 10 mg qhs Continue Bidil 20-37.5 mg 2 tabs  TID Monitor BP at home  Follow up in 2 weeks Strict return precautions provided  Type 2 diabetes mellitus with hyperglycemia (HCC) Patient not interested in starting new medications Discussed using lantus appropriately and space dosing 12 hrs apart. She is agreeable to this and will start. Recommend she schedule an appointment with Dr Raymondo Band to review Josephine Igo results and to ensure equipment is working accurately Follow up A1c due Aug     Dana Allan, MD [facility]

## 2021-05-25 NOTE — Patient Instructions (Signed)
Thank you for coming to see me today. It was a pleasure.  ? ?Increase amlodipine to 10 mg at night ? ?Take Lantus 25 units in the morning and 25 units at night ? ?Recommend scheduling an appointment with Dr. Valentina Lucks I will to review libre sensor.  If not working properly can order you another ? ?Follow-up with cardiology as scheduled. ? ?Please follow-up with PCP in 2 weeks ? ?If you have any questions or concerns, please do not hesitate to call the office at [phone]. ? ?Best,  ? ?Carollee Leitz, MD   ?

## 2021-05-29 ENCOUNTER — Encounter: Payer: Self-pay | Admitting: Family Medicine

## 2021-05-29 NOTE — Assessment & Plan Note (Signed)
Not at goal Increase Amlodipine to 10 mg qhs Continue Bidil 20-37.5 mg 2 tabs  TID Monitor BP at home  Follow up in 2 weeks Strict return precautions provided

## 2021-05-29 NOTE — Assessment & Plan Note (Signed)
Patient not interested in starting new medications Discussed using lantus appropriately and space dosing 12 hrs apart. She is agreeable to this and will start. Recommend she schedule an appointment with Dr Raymondo Band to review [name] [name] results and to ensure equipment is working accurately Follow up A1c due Aug

## 2021-06-09 ENCOUNTER — Encounter: Payer: Self-pay | Admitting: Family Medicine

## 2021-06-09 ENCOUNTER — Ambulatory Visit (INDEPENDENT_AMBULATORY_CARE_PROVIDER_SITE_OTHER): Payer: Medicare Other | Admitting: Family Medicine

## 2021-06-09 VITALS — BP 130/60 | HR 81 | Wt 186.0 lb

## 2021-06-09 DIAGNOSIS — I10 Essential (primary) hypertension: Secondary | ICD-10-CM | POA: Diagnosis present

## 2021-06-09 DIAGNOSIS — E1165 Type 2 diabetes mellitus with hyperglycemia: Secondary | ICD-10-CM

## 2021-06-09 MED ORDER — ISOSORB DINITRATE-HYDRALAZINE 20-37.5 MG PO TABS: 2.0000 | ORAL_TABLET | Freq: Three times a day (TID) | ORAL | 0 refills | Status: AC

## 2021-06-09 NOTE — Patient Instructions (Addendum)
Thank you for coming to see me today. It was a pleasure.   Continue current medications for your blood pressure. Recommend scheduling appointment with your cardiologist soon as possible  Recommend scheduling appointment with Dr. Raymondo Band here at the clinic to review your libre sensor if you think it is not working.  Change Lantus 27 units in the morning and 23 units in the evening.  This should be given 12 hours apart for better glucose control.  You are due for a colonoscopy.  Please use the form that we have given you to schedule this at your convenience.    You are due for an eye exam.  Please make sure that you call your eye doctor to have this scheduled and have them fax the results to our office.   Please follow-up with PCP in 1 week  If you have any questions or concerns, please do not hesitate to call the office at [phone].  Best,   [name] Allan, MD

## 2021-06-09 NOTE — Progress Notes (Unsigned)
    SUBJECTIVE:   CHIEF COMPLAINT / HPI: follow up blood pressure  Presents for follow up for elevated blood pressure. Seen in clinic on 05/15 and Amlodipine was increased to 10 mg.  Since then patient reports blood pressure has been good at home in 130's.  Denies any headaches, visual changes, shortness of breath, dizziness, weakness, chest pain or lower extremity swelling.  DM Type 2 Reports better control with blood sugars since switching Lantus to every 12 hours.  Has had some nighttime low glucose at 63-67 but was asymptomatic.  Took candy or peanut butter and sugar increased.  Sugars appear to be mostly in range on [place] sensor with few that have been in 200's.    PERTINENT  PMH / PSH:  DM Type 2 HTN PAFib on Eliquis  OBJECTIVE:   BP 130/60   Pulse 81   Wt 186 lb (84.4 kg)   SpO2 96%   BMI 32.95 kg/m    General: Alert, no acute distress Cardio: Normal S1 and S2, RRR, no r/m/g Pulm: CTAB, normal work of breathing Extremities: No peripheral edema.  Diabetic foot exam: No deformities, ulcerations, or other skin breakdown on feet bilaterally.  Sensation intact to monofilament and light touch.  PT and DP pulses intact BL.    ASSESSMENT/PLAN:   Hypertension Now at goal Will continue Amlodipine 10 mg daily Refill Bidil 20-37.5mg  TID Recommend she follow up with Cardiology to have medications reviewed given her cardiac history  Follow up as needed   Type 2 diabetes mellitus with hyperglycemia (HCC) Appears to have better control of glucose with proper dosing of Lantus.  Having some low glycemic readings at night without symptoms Will increase Lantus to 27 units am and decrease pm dose Lantus to 23 units Urine ACR today Follow up in 1 week Strict return precautions provided    HCM Patient reports eye exam earlier this year Will need to obtain records to confirm Continued discussion for further HCM at next visit   Dana Allan, MD [facility]

## 2021-06-10 LAB — MICROALBUMIN / CREATININE URINE RATIO
Creatinine, Urine: 79.7 mg/dL
Microalb/Creat Ratio: 35 mg/g creat — ABNORMAL HIGH (ref 0–29)
Microalbumin, Urine: 28.2 ug/mL

## 2021-06-11 ENCOUNTER — Encounter: Payer: Self-pay | Admitting: Family Medicine

## 2021-06-11 MED ORDER — LANTUS SOLOSTAR 100 UNIT/ML ~~LOC~~ SOPN: PEN_INJECTOR | SUBCUTANEOUS | 2 refills | Status: DC

## 2021-06-11 NOTE — Assessment & Plan Note (Addendum)
Appears to have better control of glucose with proper dosing of Lantus.  Having some low glycemic readings at night without symptoms Will increase Lantus to 27 units am and decrease pm dose Lantus to 23 units Urine ACR today Follow up in 1 week Strict return precautions provided

## 2021-06-11 NOTE — Assessment & Plan Note (Addendum)
Now at goal Will continue Amlodipine 10 mg daily Refill Bidil 20-37.5mg  TID Recommend she follow up with Cardiology to have medications reviewed given her cardiac history  Follow up as needed

## 2021-06-16 ENCOUNTER — Encounter: Payer: Self-pay | Admitting: Family Medicine

## 2021-06-16 NOTE — Patient Instructions (Signed)
Thank you for coming to see me today. It was a pleasure.   For your blood sugar Continue Lantus 27 units in the morning and 23 units at night  For your high blood pressure  Continue Amlodipine 10 mg daily Continue Bidil three times a day Follow up with Cardiology   Please follow-up with PCP as***  If you have any questions or concerns, please do not hesitate to call the office at [phone].  Best,   Dana Allan, MD

## 2021-06-16 NOTE — Progress Notes (Signed)
SUBJECTIVE:   CHIEF COMPLAINT / HPI: Follow-up blood pressure and diabetes  Diabetes type 2 Patient reports feeling well since change in insulin.  Not taking Lantus 27 units a.m. and 23 units p.m, but is still somewhat inconsistent with times and doses.  Blood glucose has been a little bit better but does have some low sugars primarily early a.m. and low 60s, high sugars during the day 200s.  Denies any chest pain, shortness of breath, abdominal pain, nausea, vomiting, polyuria or polydipsia.  She reports that she is feeling better and when she has her low sugars in the morning she will just eat some peanut butter and she feels better.  Hypertension Tolerating amlodipine 10 mg daily.  This was recently increased 2 weeks ago.  She denies any headaches, visual changes, shortness of breath, chest pain, dizziness, weakness, abdominal pain, nausea, vomiting or lower extremity edema.  Reports has been compliant with BiDil 2 tablets 3 times daily.  PERTINENT  PMH / PSH:  Combined heart failure Hypertension A-fib on Eliquis Diabetes type 2 Hypercholesterolemia  OBJECTIVE:   BP (!) 150/62   Pulse 82   Ht 5\' 3"  (1.6 m)   Wt 189 lb 2 oz (85.8 kg)   SpO2 97%   BMI 33.50 kg/m    General: Alert, no acute distress Cardio: Normal S1 and S2, RRR, no r/m/g Pulm: CTAB, normal work of breathing Abdomen: Bowel sounds normal. Abdomen soft and non-tender.  Extremities: No peripheral edema.    ASSESSMENT/PLAN:   Type 2 diabetes mellitus with hyperglycemia (HCC) Remains uncontrolled with some glucose readings in the low 60s early a.m. she has not been completely compliant with twice daily dosing of her insulin.  I have discussed with her again possibility of initiating SGLT2 or GLP-1 she is now agreeable to any changes in her medications.  Reports that her body is very sensitive to medications and does not want any adjustments. -Will continue Lantus 27 units in a.m. -Decrease Lantus to 20 units  in p.m. -Continue to monitor CBGs at home -Follow-up with PCP in 1 week. -Strict return precautions provided  Atrial fibrillation with RVR (HCC) Currently rate controlled, and in regular rhythm. Continue Eliquis 5 mg twice daily She is followed by cardiology. I have recommended that she schedule an appointment with Dr. at the recommended follow-up time around August 10, 2021.   Chronic combined systolic and diastolic CHF (congestive heart failure) (HCC) Stable.  Was on digoxin 0.125 mg.  Recent digoxin level 1.4.  Currently asymptomatic.  Most recent TSH within normal limits Current medications Amiodarone 200 mg daily and digoxin 0.125 mg daily I have asked the patient to schedule an appoint with Dr. August 12, 2021 her cardiologist to discuss discontinuation or decrease in dosage of digoxin.  Patient is aware that I will not reorder this medication or her amiodarone as these medications require cardiology monitoring.  Message was sent to inform Dr. Odis Hollingshead of same.      Hypertension Blood pressure elevated today.  Goal of less than 130/80 Recently increased amlodipine to 10 mg nightly.  Patient reports compliancy with amlodipine and BiDil 3 times daily.  Discussed initiation of another antihypertensive to control blood pressure but patient does not want to start another medication as she reports that her body is too sensitive to medications. -Monitor blood pressures at home -Continue amlodipine 10 mg nightly -Continue BiDil 20-to 37.5 mg 2 tablets p.o. 3 times daily -Follow-up with PCP in 1 week, ideally I would like  to initiate an ARB for her diabetes as well as for kidney protection -Strict return precautions provided  Healthcare maintenance Declined colonoscopy Reports had eye exam earlier this year.  Have requested that patient get her eye exam results faxed to the clinic.     Dana Allan, MD [facility]

## 2021-06-17 ENCOUNTER — Encounter: Payer: Self-pay | Admitting: Family Medicine

## 2021-06-17 ENCOUNTER — Ambulatory Visit (INDEPENDENT_AMBULATORY_CARE_PROVIDER_SITE_OTHER): Payer: Medicare Other | Admitting: Family Medicine

## 2021-06-17 VITALS — BP 150/62 | HR 82 | Ht 63.0 in | Wt 189.1 lb

## 2021-06-17 DIAGNOSIS — I5042 Chronic combined systolic (congestive) and diastolic (congestive) heart failure: Secondary | ICD-10-CM

## 2021-06-17 DIAGNOSIS — E1165 Type 2 diabetes mellitus with hyperglycemia: Secondary | ICD-10-CM

## 2021-06-17 DIAGNOSIS — I10 Essential (primary) hypertension: Secondary | ICD-10-CM | POA: Diagnosis not present

## 2021-06-17 DIAGNOSIS — Z0000 Encounter for general adult medical examination without abnormal findings: Secondary | ICD-10-CM

## 2021-06-17 DIAGNOSIS — I4891 Unspecified atrial fibrillation: Secondary | ICD-10-CM | POA: Diagnosis not present

## 2021-06-25 ENCOUNTER — Encounter: Payer: Self-pay | Admitting: Family Medicine

## 2021-06-25 NOTE — Assessment & Plan Note (Signed)
Currently rate controlled, and in regular rhythm. Continue Eliquis 5 mg twice daily She is followed by cardiology. I have recommended that she schedule an appointment with Dr. Odis Hollingshead at the recommended follow-up time around August 10, 2021.

## 2021-06-25 NOTE — Assessment & Plan Note (Addendum)
Stable.  Was on digoxin 0.125 mg.  Recent digoxin level 1.4.  Currently asymptomatic.  Most recent TSH within normal limits Current medications Amiodarone 200 mg daily and digoxin 0.125 mg daily I have asked the patient to schedule an appoint with Dr. Odis Hollingshead her cardiologist to discuss discontinuation or decrease in dosage of digoxin.  Patient is aware that I will not reorder this medication or her amiodarone as these medications require cardiology monitoring.  Message was sent to inform Dr. Odis Hollingshead of same.

## 2021-06-25 NOTE — Assessment & Plan Note (Signed)
Remains uncontrolled with some glucose readings in the low 60s early a.m. she has not been completely compliant with twice daily dosing of her insulin.  I have discussed with her again possibility of initiating SGLT2 or GLP-1 she is now agreeable to any changes in her medications.  Reports that her body is very sensitive to medications and does not want any adjustments. -Will continue Lantus 27 units in a.m. -Decrease Lantus to 20 units in p.m. -Continue to monitor CBGs at home -Follow-up with PCP in 1 week. -Strict return precautions provided

## 2021-06-25 NOTE — Assessment & Plan Note (Signed)
Declined colonoscopy Reports had eye exam earlier this year.  Have requested that patient get her eye exam results faxed to the clinic.

## 2021-06-25 NOTE — Assessment & Plan Note (Signed)
Blood pressure elevated today.  Goal of less than 130/80 Recently increased amlodipine to 10 mg nightly.  Patient reports compliancy with amlodipine and BiDil 3 times daily.  Discussed initiation of another antihypertensive to control blood pressure but patient does not want to start another medication as she reports that her body is too sensitive to medications. -Monitor blood pressures at home -Continue amlodipine 10 mg nightly -Continue BiDil 20-to 37.5 mg 2 tablets p.o. 3 times daily -Follow-up with PCP in 1 week, ideally I would like to initiate an ARB for her diabetes as well as for kidney protection -Strict return precautions provided

## 2021-06-26 ENCOUNTER — Other Ambulatory Visit: Payer: Self-pay | Admitting: Family Medicine

## 2021-06-29 ENCOUNTER — Other Ambulatory Visit: Payer: Self-pay | Admitting: *Deleted

## 2021-06-29 MED ORDER — PANTOPRAZOLE SODIUM 40 MG PO TBEC: DELAYED_RELEASE_TABLET | ORAL | 0 refills | Status: DC

## 2021-07-03 ENCOUNTER — Ambulatory Visit (INDEPENDENT_AMBULATORY_CARE_PROVIDER_SITE_OTHER): Payer: Medicare Other | Admitting: Family Medicine

## 2021-07-03 ENCOUNTER — Encounter: Payer: Self-pay | Admitting: Family Medicine

## 2021-07-03 VITALS — BP 138/68 | HR 77 | Wt 185.0 lb

## 2021-07-03 DIAGNOSIS — I5042 Chronic combined systolic (congestive) and diastolic (congestive) heart failure: Secondary | ICD-10-CM | POA: Diagnosis not present

## 2021-07-03 DIAGNOSIS — I10 Essential (primary) hypertension: Secondary | ICD-10-CM | POA: Diagnosis not present

## 2021-07-03 DIAGNOSIS — E1165 Type 2 diabetes mellitus with hyperglycemia: Secondary | ICD-10-CM

## 2021-07-07 ENCOUNTER — Encounter: Payer: Self-pay | Admitting: Family Medicine

## 2021-09-28 ENCOUNTER — Telehealth: Payer: Self-pay

## 2021-09-28 ENCOUNTER — Other Ambulatory Visit: Payer: Self-pay

## 2021-09-28 MED ORDER — BD PEN NEEDLE MINI U/F 31G X 5 MM MISC: 1 refills | Status: DC

## 2021-09-28 MED ORDER — AMLODIPINE BESYLATE 10 MG PO TABS: 10.0000 mg | ORAL_TABLET | Freq: Every day | ORAL | 0 refills | Status: DC

## 2021-09-28 MED ORDER — ATORVASTATIN CALCIUM 80 MG PO TABS: ORAL_TABLET | ORAL | 1 refills | Status: DC

## 2021-09-28 MED ORDER — PANTOPRAZOLE SODIUM 40 MG PO TBEC: DELAYED_RELEASE_TABLET | ORAL | 0 refills | Status: DC

## 2021-09-28 NOTE — Telephone Encounter (Signed)
Will do!

## 2021-09-28 NOTE — Telephone Encounter (Signed)
Patient has scheduled a new patient appointment with Dr. Carollee Leitz for 01/06/2022.  Patient states she has been seeing Dr. Volanda Napoleon in [place].  Patient states she is out of the Insulin Pen Needle (B-D UF III MINI PEN NEEDLES) 31G X 5 MM MISC.  Patient states she needs a 50-month refill for her atorvastatin (LIPITOR) 80 MG tablet, amLODipine (NORVASC) 10 MG tablet, and pantoprazole (PROTONIX) 40 MG tablet.  Patient preferred pharmacy is [facility] at [place].

## 2021-09-28 NOTE — Telephone Encounter (Signed)
Prescriptions refilled.

## 2021-10-03 ENCOUNTER — Telehealth: Payer: Self-pay | Admitting: Family Medicine

## 2021-10-12 NOTE — Telephone Encounter (Signed)
Spoke with patient.  She has refills that were prescribed by Dr Arby Barrette at [facility] residency.  She has an appointment with me in Dec.  She is to continue Lantus 27 units in am and 20 units pm until then.  We plan to discuss oral medications at that time as well.

## 2021-10-12 NOTE — Telephone Encounter (Signed)
Patient is calling about the refill on her insulin. She states she saw Dr Volanda Napoleon at her other office. Tried to explain that she isn't a patient at this location until she comes for her new patient appointment in December. Patient states she only wants Dr Volanda Napoleon to refill her insulin.

## 2021-12-18 ENCOUNTER — Emergency Department (HOSPITAL_COMMUNITY): Payer: Medicare Other

## 2021-12-18 ENCOUNTER — Observation Stay (HOSPITAL_COMMUNITY)
Admission: EM | Admit: 2021-12-18 | Discharge: 2021-12-20 | Disposition: A | Discharge status: Home / Self Care | Payer: Medicare Other | Attending: Family Medicine | Admitting: Family Medicine

## 2021-12-18 ENCOUNTER — Other Ambulatory Visit: Payer: Self-pay

## 2021-12-18 ENCOUNTER — Encounter (HOSPITAL_COMMUNITY): Payer: Self-pay

## 2021-12-18 DIAGNOSIS — I4891 Unspecified atrial fibrillation: Secondary | ICD-10-CM | POA: Diagnosis present

## 2021-12-18 DIAGNOSIS — Z7901 Long term (current) use of anticoagulants: Secondary | ICD-10-CM | POA: Insufficient documentation

## 2021-12-18 DIAGNOSIS — I480 Paroxysmal atrial fibrillation: Principal | ICD-10-CM | POA: Insufficient documentation

## 2021-12-18 DIAGNOSIS — R7989 Other specified abnormal findings of blood chemistry: Secondary | ICD-10-CM

## 2021-12-18 DIAGNOSIS — I110 Hypertensive heart disease with heart failure: Secondary | ICD-10-CM | POA: Diagnosis not present

## 2021-12-18 DIAGNOSIS — Z794 Long term (current) use of insulin: Secondary | ICD-10-CM | POA: Insufficient documentation

## 2021-12-18 DIAGNOSIS — Z8673 Personal history of transient ischemic attack (TIA), and cerebral infarction without residual deficits: Secondary | ICD-10-CM | POA: Diagnosis not present

## 2021-12-18 DIAGNOSIS — E119 Type 2 diabetes mellitus without complications: Secondary | ICD-10-CM | POA: Diagnosis not present

## 2021-12-18 DIAGNOSIS — Z79899 Other long term (current) drug therapy: Secondary | ICD-10-CM | POA: Diagnosis not present

## 2021-12-18 DIAGNOSIS — I5032 Chronic diastolic (congestive) heart failure: Secondary | ICD-10-CM | POA: Diagnosis not present

## 2021-12-18 DIAGNOSIS — Z8616 Personal history of COVID-19: Secondary | ICD-10-CM | POA: Diagnosis not present

## 2021-12-18 DIAGNOSIS — R42 Dizziness and giddiness: Secondary | ICD-10-CM | POA: Diagnosis present

## 2021-12-18 LAB — CBC WITH DIFFERENTIAL/PLATELET
Abs Immature Granulocytes: 0.03 10*3/uL (ref 0.00–0.07)
Basophils Absolute: 0.1 10*3/uL (ref 0.0–0.1)
Basophils Relative: 1 %
Eosinophils Absolute: 0.2 10*3/uL (ref 0.0–0.5)
Eosinophils Relative: 2 %
HCT: 39.7 % (ref 36.0–46.0)
Hemoglobin: 12.6 g/dL (ref 12.0–15.0)
Immature Granulocytes: 0 %
Lymphocytes Relative: 40 %
Lymphs Abs: 3.4 10*3/uL (ref 0.7–4.0)
MCH: 25.9 pg — ABNORMAL LOW (ref 26.0–34.0)
MCHC: 31.7 g/dL (ref 30.0–36.0)
MCV: 81.5 fL (ref 80.0–100.0)
Monocytes Absolute: 0.7 10*3/uL (ref 0.1–1.0)
Monocytes Relative: 8 %
Neutro Abs: 4.3 10*3/uL (ref 1.7–7.7)
Neutrophils Relative %: 49 %
Platelets: 188 10*3/uL (ref 150–400)
RBC: 4.87 MIL/uL (ref 3.87–5.11)
RDW: 14.9 % (ref 11.5–15.5)
WBC: 8.6 10*3/uL (ref 4.0–10.5)
nRBC: 0 % (ref 0.0–0.2)

## 2021-12-18 LAB — I-STAT CHEM 8, ED
BUN: 33 mg/dL — ABNORMAL HIGH (ref 8–23)
Calcium, Ion: 1.09 mmol/L — ABNORMAL LOW (ref 1.15–1.40)
Chloride: 102 mmol/L (ref 98–111)
Creatinine, Ser: 1.1 mg/dL — ABNORMAL HIGH (ref 0.44–1.00)
Glucose, Bld: 382 mg/dL — ABNORMAL HIGH (ref 70–99)
HCT: 40 % (ref 36.0–46.0)
Hemoglobin: 13.6 g/dL (ref 12.0–15.0)
Potassium: 5.6 mmol/L — ABNORMAL HIGH (ref 3.5–5.1)
Sodium: 137 mmol/L (ref 135–145)
TCO2: 27 mmol/L (ref 22–32)

## 2021-12-18 NOTE — ED Triage Notes (Signed)
Patient bib [facility] from home with complaints of tachycardia. [facility] reports heart rate of 181. Syncope episode x2 today daughter caught her she did not have a fall associated with the syncope. She was diaphoretic and vomited also. Patient has a hx of CHF, diabetes, HTN and has been cardioverted in the past. Patient took 20 units Lantus before coming to the emergency room prior to the lantus her CBG was 461.

## 2021-12-18 NOTE — ED Provider Notes (Signed)
[facility] Provider Note   CSN: [id number] Arrival date & time: 12/18/21  2204     History {Add pertinent medical, surgical, social history, OB history to HPI:1} Chief Complaint  Patient presents with   tachycardic    [name] [name] is a 71 y.o. female who presents via EMS for palpitations with question of A-fib with RVR and route.  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  Formerly and ejection but recovered cardiomyopathy no longer on digoxin since in June of this year.  Patient states that less than 1 hour prior to arrival to the ED she had sudden onset of "funny feeling" in her chest, lightheadedness, near syncope, nausea with single episode of NBNB emesis, and generalized weakness prompting EMS dispatched by her Granddaughter.  At time my arrival to the bedside patient is converted to normal sinus rhythm and is feeling much improved generally fatigued.  In addition to the above listed history she has GERD and hypertension.  HPI     Home Medications Prior to Admission medications   Medication Sig Start Date End Date Taking? Authorizing Provider  acetaminophen (TYLENOL) 500 MG tablet Take 500 mg by mouth 3 (three) times daily. Morning and evening    [provider]  amiodarone (PACERONE) 200 MG tablet Take 100 mg by mouth daily.    [provider]  amLODipine (NORVASC) 10 MG tablet Take 1 tablet (10 mg total) by mouth at bedtime. 09/28/21   Carollee Leitz, MD  apixaban (ELIQUIS) 5 MG TABS tablet TAKE 1 TABLET(5 MG) BY MOUTH TWICE DAILY 07/23/20   Carollee Leitz, MD  atorvastatin  (LIPITOR) 80 MG tablet TAKE 1 TABLET(80 MG) BY MOUTH DAILY 09/28/21   Carollee Leitz, MD  Blood Glucose Monitoring Suppl (ONETOUCH VERIO) w/Device KIT Please use to check blood sugar up to three times daily. E11.65 07/17/20   Carollee Leitz, MD  Blood Pressure KIT Monitor blood pressure twice a day 09/28/19   Carollee Leitz, MD  glucose blood [facility] VERIO) test strip Please use to check blood sugar up to three times daily. E11.65 05/12/21   Carollee Leitz, MD  insulin glargine (LANTUS SOLOSTAR) 100 UNIT/ML Solostar Pen ADMINISTER 25 UNITS UNDER THE SKIN TWICE DAILY 10/05/21   Shary Key, DO  Insulin Pen Needle (B-D UF III MINI PEN NEEDLES) 31G X 5 MM MISC Use for insulin injections 2x per day 09/28/21   Carollee Leitz, MD  Lancet Devices (ONE TOUCH DELICA LANCING DEV) MISC Please use to check blood sugar up to three times daily. E11.65 07/17/20   Carollee Leitz, MD  OneTouch Delica Lancets 07W MISC Please use to check blood sugar up to three times daily. E11.65 07/17/20   Carollee Leitz, MD  pantoprazole (PROTONIX) 40 MG tablet TAKE 1 TABLET(40 MG) BY MOUTH DAILY 09/28/21   Carollee Leitz, MD      Allergies    Amiodarone, Fruit & vegetable daily [nutritional supplements], Metoprolol, Other, Peanut-containing drug products, Aspirin, and Lactose intolerance (gi)    Review of Systems   Review of Systems  Respiratory:  Positive for shortness of breath.   Cardiovascular:  Positive for palpitations.  Gastrointestinal:  Positive for nausea.  Neurological:  Positive  for weakness and light-headedness.    Physical Exam Updated Vital Signs There were no vitals taken for this visit. Physical Exam Vitals and nursing note reviewed.  Constitutional:      Appearance: She is obese. She is not ill-appearing or toxic-appearing.  HENT:     Head: Normocephalic and atraumatic.     Mouth/Throat:     Mouth: Mucous membranes are moist.     Pharynx: No oropharyngeal exudate or posterior oropharyngeal erythema.  Eyes:      General:        Right eye: No discharge.        Left eye: No discharge.     Extraocular Movements: Extraocular movements intact.     Conjunctiva/sclera: Conjunctivae normal.     Pupils: Pupils are equal, round, and reactive to light.  Cardiovascular:     Rate and Rhythm: Normal rate and regular rhythm.     Pulses: Normal pulses.     Heart sounds: Normal heart sounds. No murmur heard. Pulmonary:     Effort: Pulmonary effort is normal. No respiratory distress.     Breath sounds: Normal breath sounds. No wheezing or rales.  Abdominal:     General: Bowel sounds are normal. There is no distension.     Palpations: Abdomen is soft.     Tenderness: There is no abdominal tenderness. There is no guarding or rebound.  Musculoskeletal:        General: No deformity.     Cervical back: Neck supple.     Right lower leg: No edema.     Left lower leg: No edema.  Skin:    General: Skin is warm and dry.     Capillary Refill: Capillary refill takes less than 2 seconds.  Neurological:     General: No focal deficit present.     Mental Status: She is alert and oriented to person, place, and time. Mental status is at baseline.  Psychiatric:        Mood and Affect: Mood normal.     ED Results / Procedures / Treatments   Labs (all labs ordered are listed, but only abnormal results are displayed) Labs Reviewed - No data to display  EKG None  Radiology No results found.  Procedures Procedures  {Document cardiac monitor, telemetry assessment procedure when appropriate:1}  Medications Ordered in ED Medications - No data to display  ED Course/ Medical Decision Making/ A&P                           Medical Decision Making 71 year old female who presents with concern for palpitations and transient weakness now resolved.  Hypertensive on intake, vital signs otherwise normal.  Cardiopulmonary exam is unremarkable at time of my arrival to the bedside.  Abdominal exam is benign.  Patient is well  in sinus rhythm on her EKG cardiac monitoring.  Differential diagnosis includes is not limited to atrial fibrillation with rapid ventricular response, ventricular tachycardia, SVT, drug effect, block derangement.   Amount and/or Complexity of Data Reviewed ECG/medicine tests:     Details: Rhythm strip provided by EMS with tachycardic rate in the 180s with undetermined rhythm.  EKG in the ED with normal sinus rhythm but with new ischemic changes in the inferior lateral leads new from previous EKG in the system   ***  {Document critical care time when appropriate:1} {Document review of labs and clinical decision tools ie heart score, Chads2Vasc2 etc:1}  {Document your independent review of  radiology images, and any outside records:1} {Document your discussion with family members, caretakers, and with consultants:1} {Document social determinants of health affecting pt's care:1} {Document your decision making why or why not admission, treatments were needed:1} Final Clinical Impression(s) / ED Diagnoses Final diagnoses:  None    Rx / DC Orders ED Discharge Orders     None

## 2021-12-19 ENCOUNTER — Observation Stay (HOSPITAL_BASED_OUTPATIENT_CLINIC_OR_DEPARTMENT_OTHER): Payer: Medicare Other

## 2021-12-19 DIAGNOSIS — I480 Paroxysmal atrial fibrillation: Secondary | ICD-10-CM | POA: Diagnosis not present

## 2021-12-19 DIAGNOSIS — R9431 Abnormal electrocardiogram [ECG] [EKG]: Secondary | ICD-10-CM

## 2021-12-19 DIAGNOSIS — I4891 Unspecified atrial fibrillation: Secondary | ICD-10-CM | POA: Diagnosis not present

## 2021-12-19 LAB — HIV ANTIBODY (ROUTINE TESTING W REFLEX): HIV Screen 4th Generation wRfx: NONREACTIVE

## 2021-12-19 LAB — ECHOCARDIOGRAM COMPLETE
AR max vel: 2.31 cm2
AV Area VTI: 2.27 cm2
AV Area mean vel: 2.28 cm2
AV Mean grad: 4 mmHg
AV Peak grad: 7.5 mmHg
Ao pk vel: 1.37 m/s
Area-P 1/2: 4.06 cm2
Height: 63 in
S' Lateral: 2.7 cm
Weight: 2880 oz

## 2021-12-19 LAB — BASIC METABOLIC PANEL
Anion gap: 10 (ref 5–15)
BUN: 20 mg/dL (ref 8–23)
CO2: 23 mmol/L (ref 22–32)
Calcium: 9.2 mg/dL (ref 8.9–10.3)
Chloride: 106 mmol/L (ref 98–111)
Creatinine, Ser: 1.04 mg/dL — ABNORMAL HIGH (ref 0.44–1.00)
GFR, Estimated: 58 mL/min — ABNORMAL LOW (ref >60)
Glucose, Bld: 279 mg/dL — ABNORMAL HIGH (ref 70–99)
Potassium: 4.3 mmol/L (ref 3.5–5.1)
Sodium: 139 mmol/L (ref 135–145)

## 2021-12-19 LAB — GLUCOSE, CAPILLARY
Glucose-Capillary: 206 mg/dL — ABNORMAL HIGH (ref 70–99)
Glucose-Capillary: 197 mg/dL — ABNORMAL HIGH (ref 70–99)

## 2021-12-19 LAB — CBG MONITORING, ED
Glucose-Capillary: 165 mg/dL — ABNORMAL HIGH (ref 70–99)
Glucose-Capillary: 166 mg/dL — ABNORMAL HIGH (ref 70–99)

## 2021-12-19 LAB — TROPONIN I (HIGH SENSITIVITY)
Troponin I (High Sensitivity): 18 ng/L — ABNORMAL HIGH (ref <18)
Troponin I (High Sensitivity): 28 ng/L — ABNORMAL HIGH (ref <18)
Troponin I (High Sensitivity): 31 ng/L — ABNORMAL HIGH (ref <18)
Troponin I (High Sensitivity): 29 ng/L — ABNORMAL HIGH (ref <18)
Troponin I (High Sensitivity): 25 ng/L — ABNORMAL HIGH (ref <18)

## 2021-12-19 LAB — BRAIN NATRIURETIC PEPTIDE: B Natriuretic Peptide: 91.4 pg/mL (ref 0.0–100.0)

## 2021-12-19 MED ORDER — ACETAMINOPHEN 650 MG RE SUPP: 650.0000 mg | Freq: Four times a day (QID) | RECTAL | Status: DC | PRN

## 2021-12-19 MED ORDER — INSULIN GLARGINE-YFGN 100 UNIT/ML ~~LOC~~ SOLN
20.0000 [IU] | Freq: Every day | SUBCUTANEOUS | Status: DC
  Administered 2021-12-19: 20 [IU] via SUBCUTANEOUS
  Filled 2021-12-19 (×2): qty 0.2

## 2021-12-19 MED ORDER — ORAL CARE MOUTH RINSE: 15.0000 mL | OROMUCOSAL | Status: DC | PRN

## 2021-12-19 MED ORDER — AMLODIPINE BESYLATE 10 MG PO TABS
10.0000 mg | ORAL_TABLET | Freq: Every day | ORAL | Status: DC
  Administered 2021-12-19: 10 mg via ORAL
  Filled 2021-12-19: qty 1

## 2021-12-19 MED ORDER — ENOXAPARIN SODIUM 40 MG/0.4ML IJ SOSY
40.0000 mg | PREFILLED_SYRINGE | INTRAMUSCULAR | Status: DC
  Administered 2021-12-19 – 2021-12-20 (×2): 40 mg via SUBCUTANEOUS
  Filled 2021-12-19 (×2): qty 0.4

## 2021-12-19 MED ORDER — ATORVASTATIN CALCIUM 80 MG PO TABS
80.0000 mg | ORAL_TABLET | Freq: Every day | ORAL | Status: DC
  Administered 2021-12-20: 80 mg via ORAL
  Filled 2021-12-19: qty 1

## 2021-12-19 MED ORDER — PANTOPRAZOLE SODIUM 40 MG PO TBEC
40.0000 mg | DELAYED_RELEASE_TABLET | Freq: Every day | ORAL | Status: DC
  Administered 2021-12-19 – 2021-12-20 (×2): 40 mg via ORAL
  Filled 2021-12-19 (×2): qty 1

## 2021-12-19 MED ORDER — INSULIN ASPART 100 UNIT/ML IJ SOLN
0.0000 [IU] | Freq: Three times a day (TID) | INTRAMUSCULAR | Status: DC
  Administered 2021-12-19: 2 [IU] via SUBCUTANEOUS
  Administered 2021-12-19: 3 [IU] via SUBCUTANEOUS
  Administered 2021-12-20: 1 [IU] via SUBCUTANEOUS
  Administered 2021-12-20: 5 [IU] via SUBCUTANEOUS
  Administered 2021-12-20: 3 [IU] via SUBCUTANEOUS

## 2021-12-19 MED ORDER — ACETAMINOPHEN 325 MG PO TABS
650.0000 mg | ORAL_TABLET | Freq: Four times a day (QID) | ORAL | Status: DC | PRN
  Administered 2021-12-19 (×2): 650 mg via ORAL
  Filled 2021-12-19 (×2): qty 2

## 2021-12-19 NOTE — ED Notes (Signed)
WHEELED PATIENT TO THE BATHROOM PATIENT DID WELL 

## 2021-12-19 NOTE — Hospital Course (Signed)
[name] [name] is a 71 y.o. female with history of afib/flutter no longer on Eliquis admitted for Afib w/ RVR.PMH significant for Afib/aflutter, HFimpEF, grade 3 distolic dysfunction, diabetes, htn, tricuspid regurg, prior embolic stroke requiring tpa, mechanical thrombectomy resulting in cardiogenic shock with PEA arrest.   Atrial fibrillation with RVR Pt was likely in A fib w/ RVR with EMS but self-converted to NSR in the ED. Patient was no longer taking eliquis. Cardiology was consulted. Patient was previously on digoxin and amiodorone but had not been taking those either.

## 2021-12-19 NOTE — H&P (Deleted)
error 

## 2021-12-19 NOTE — H&P (Signed)
FMTS Attending Admission Note: [name] Netters MD Personal pager:  [phone] FPTS Service Pager:  [phone]  I have seen and examined this patient, and reviewed their chart. I have discussed this patient with the resident. I agree with the resident's findings, assessment and care plan.  Additionally:  Briefly, 71 y.o. female with a history of afib/flutter no longer on eliquis, HFimpEF (EF May 2022 was 20-25%, in Oct 2022 was 55-60%), grade III diastolic dysfunction, diabetes, hypertension, history of esophageal stricture, morbid obesity, tricuspid regurg, prior stroke presenting with tachycardia, syncope, diaphoresis, vomiting. Notably prior stroke was embolic, needed TPA, mechanical thrombectomy and developed cardiogenic shock and PEA arrest.   EMS rhythm strip demonstrating likely afib with heart rate in 180s, but she had spontaneously converted to NSR upon arrival here. Cardiology fellow consulted overnight and recommended trop cycling (18>28>31).  Echo this AM demonstrates EF 55-60%, grade III diastolic dysfunction. Patient reports feeling well at this time, remains in NSR. Only concern she identified today is pain in RLE radiating down from back; history of the same improved with heating pad in the past. Asked RN to give her a heating pad. Full strength RLE with sensation intact on exam.  Reviewed records - history of PAF, last seen by cardiology in January 2023, at which time it appears she was to continue her digoxin, amiodarone, and eliquis. Since then, digoxin was stopped at cardiology's recommendation (see [place] office visit 07/03/21). Of note patient plans to transfer her care to see Dr. Volanda Napoleon (prior resident PCP) at her new attending practice, has upcoming appointment to establish there.  Given complex cardiac history (including DCCV for afib in past) will consult cardiology for formal recommendations. Greatly appreciate cardiology recommendations.   Leeanne Rio, MD 12/19/2021   -----------------------------------------------------------------------------------------------------      Hospital Admission History and Physical Service Pager: [phone]  Patient name: [name] [name] Medical record number: [id number] Date of Birth: [birth year] Age: 71 y.o. Gender: female  Primary Care Provider: Shary Key, DO Consultants: Cardiology Code Status: Full code Preferred Emergency Contact:  Contact Information     Name Relation Home Work [place] Daughter [phone]  [phone]   Concepcion Living Daughter [phone]          Chief Complaint: lightheadedness, N/V  Assessment and Plan: [name] [name] is a 71 y.o. female presenting with an episode of light headedness, near syncope, N/V likely 2/2 to Afib with RVR.  PMH chronic heart failure with preserved ejection fraction with diastolic dysfunction, paroxysmal A-fib not on Eliquis, type 2 diabetes with insulin dependence and history embolic stroke requiring tPA and mechanical thrombectomy at which time she had cardiogenic shock and PEA arrest following thrombectomy.   * Atrial fibrillation with RVR (HCC) Symptomatic this am with lightheadedness, near syncope, nausea, emesis. HR elevated at home checked by daughter who then called EMS, and was potentially in A fib with RVR en route. Self converted out of A-fib while in the ED. EKG showing NSR. Repolarization abnormality indicating severe global ischemia. New ischemic changes in inferior and lateral leads. Patient no longer taking Eliquis. Cardiology consulted who recommended observing over night and trending Troponins. - admit to [facility] attending Dr. Ardelia Mems - Cardiology following, appreciate recommendations - vitals per floor - continue to trend Trops - echo - cardiac monitoring  - am BMP - start DOAC prior to d/c    Other stable chronic conditions  DM2- takes Lantus 27 units in a.m., 20 units in the  p.m. Added  20 units Lantus tonight plus sSSI  HTN- amlodipine 10 mg nightly, BiDil 20-37.5 mg 3 times daily Chronic combined systolic and diastolic CHF- Last echo 1 year ago with EF 55-60%, moderate LVH, Grade III DD   FEN/GI: Heart healthy/carb modified  VTE Prophylaxis: Lovenox   Disposition: Likely home tomorrow pending cardiac work up   History of Present Illness:  [name] [name] is a 71 y.o. female presenting with  Around 5-6PM yesterday (12/8) patient's granddaughter came over and she started feeling sweaty, upset stomach, dizzy and felt like passing out. Started vomiting. Granddaughter took pulse after vomiting and felt like it was racing and called EMS. Denies chest pain. + weakness. Granddaughter said she passed out but she doesn't remember. This has happened before about 1.5 year ago. Denies illness, SOB prior to this episode.  Not on any medications. Was on Eliquis but stopped taking. Patient states Dr. Jerald Kief stopped it. Taking insulin for diabetes, 26U in AM and 20U in PM.  Currently, denies stomach pain, urination difficulties. Drink one cup coffee per day. Chronic hip and back pain. Denies all symptoms currently.  Patient presented via EMS for palpitations with possible A-fib with RVR en route to the hospital. Had a "funny feeling" in her chest about an hour prior to arrival with lightheadedness, near syncope, nausea and single episode of emesis.   In the ED, by the time provider arrived bedside she had converted to normal sinus rhythm and felt better. EKG performed showing NSR. Repolarization abnormality indicating severe global ischemia. New ischemic changes in inferior and lateral leads. CBC, BNP normal. BMP with elevated glucose of 279  Review Of Systems: Per HPI with the following additions: Hip pain, back pain  Pertinent Past Medical History: Paroxysmal afib Remainder reviewed in history tab.   Pertinent Past Surgical History: Cardioversion x 2 Remainder  reviewed in history tab.   Pertinent Social History: Tobacco use: No Alcohol use: No Other Substance use: No Lives with alone  Pertinent Family History: Mother: stroke  Remainder reviewed in history tab.   Important Outpatient Medications: Insulin 26U in AM and 20U in PM Remainder reviewed in medication history.   Objective: BP (!) 158/81   Pulse 74   Temp 98 F (36.7 C) (Oral)   Resp 16   Ht 5\' 3"  (1.6 m)   Wt 81.6 kg   SpO2 95%   BMI 31.89 kg/m  Exam: General: alert, pleasant, NAD Eyes: EOMI. Normal conjunctiva  ENTM: NCAT. MMM Neck: normal Cardiovascular: RRR no murmurs Respiratory: CTAB normal WOB Gastrointestinal: soft, non tender, non distended MSK: moves spontaneously and equally Derm: warm, dry. No visible rashes or lesions.  Neuro: alert and oriented. No focal deficits  Psych: mood and affect normal. Speech non tangential.   Labs:  CBC BMET  Recent Labs  Lab 12/18/21 2215 12/18/21 2225  WBC 8.6  --   HGB 12.6 13.6  HCT 39.7 40.0  PLT 188  --    Recent Labs  Lab 12/19/21 0321  NA 139  K 4.3  CL 106  CO2 23  BUN 20  CREATININE 1.04*  GLUCOSE 279*  CALCIUM 9.2     Other labs: Trop 18> 28  EKG: NSR. Repolarization abnormality indicating severe global ischemia  Imaging Studies Performed:  CXR: Cardiomegaly. Central vascular congestoin.   14/09/23, DO 12/19/2021, 5:38 AM PGY-3, [facility]  FPTS Intern pager: [phone], text pages welcome Secure chat group [facility] Teaching Service

## 2021-12-19 NOTE — Progress Notes (Addendum)
Error - see separate H&P

## 2021-12-19 NOTE — ED Notes (Signed)
ECHO at bedside.

## 2021-12-19 NOTE — Assessment & Plan Note (Addendum)
Symptomatic this am with lightheadedness, near syncope, nausea, emesis. HR elevated at home checked by daughter who then called EMS, and was potentially in A fib with RVR en route. Self converted out of A-fib while in the ED. EKG showing NSR. Repolarization abnormality indicating severe global ischemia. New ischemic changes in inferior and lateral leads. Patient no longer taking Eliquis. Cardiology consulted who recommended observing over night and trending Troponins. - admit to [facility] attending Dr. Pollie Meyer - Cardiology following, appreciate recommendations - vitals per floor - continue to trend Trops - echo - cardiac monitoring  - am BMP - start DOAC prior to d/c

## 2021-12-19 NOTE — Progress Notes (Signed)
PT Cancellation Note  Patient Details Name: [name] [name] MRN: [id number] DOB: 1950-02-10   Cancelled Treatment:    Reason Eval/Treat Not Completed: Medical issues which prohibited therapy.  Wait for cardiology due to global ischemia and to see if walking limited by MD.  Retry as time and pt allow.   Ivar Drape 12/19/2021, 12:05 PM  Samul Dada, PT PhD Acute Rehab Dept. Number: [facility] [id number] and [facility] [phone]

## 2021-12-19 NOTE — Evaluation (Signed)
Occupational Therapy Evaluation/Discharge Patient Details Name: [name] [name] MRN: [id number] DOB: Nov 02, 1950 Today's Date: 12/19/2021   History of Present Illness Pt is a 71 y/o female who presented w/ near syncope in setting of a fib with RVR. PMH: CHF, a fib, DM2, hx of embolic stroke requiring mechanical thrombectomy w/ cardiogenic shock and PEA arrest.   Clinical Impression   PTA, pt lives in a senior apartment, typically Modified Independent with ADLs, IADLs and mobility using Rollator. Pt presents now with minor deficits in RLE function (reports this is from a prior hospitalization). Overall, pt able to mobilize in hallway to bathroom and manage ADLs at or near baseline. No overt LOB noted, orthostatics negative. If pt to remain admitted, would benefit from mobility specialist's service though no further skilled OT services needed at this time. Pt functionally appropriate for DC home once deemed medically stable. Please reconsult if needs change.      Recommendations for follow up therapy are one component of a multi-disciplinary discharge planning process, led by the attending physician.  Recommendations may be updated based on patient status, additional functional criteria and insurance authorization.   Follow Up Recommendations  No OT follow up     Assistance Recommended at Discharge PRN  Patient can return home with the following      Functional Status Assessment  Patient has not had a recent decline in their functional status  Equipment Recommendations  None recommended by OT    Recommendations for Other Services       Precautions / Restrictions Precautions Precautions: Fall;Other (comment) Precaution Comments: monitor HR Restrictions Weight Bearing Restrictions: No      Mobility Bed Mobility Overal bed mobility: Modified Independent                  Transfers Overall transfer level: Modified independent Equipment used: Rolling walker (2  wheels)                      Balance Overall balance assessment: Needs assistance Sitting-balance support: No upper extremity supported, Feet supported Sitting balance-Leahy Scale: Good     Standing balance support: Bilateral upper extremity supported, No upper extremity supported, During functional activity Standing balance-Leahy Scale: Fair                             ADL either performed or assessed with clinical judgement   ADL Overall ADL's : Needs assistance/impaired Eating/Feeding: Independent   Grooming: Modified independent;Standing;Wash/dry hands   Upper Body Bathing: Modified independent   Lower Body Bathing: Modified independent   Upper Body Dressing : Modified independent   Lower Body Dressing: Minimal assistance;Sit to/from stand Lower Body Dressing Details (indicate cue type and reason): assist due to high stretcher height to doff/don pants around feet. pt able to easily stand and manage around waist Toilet Transfer: Supervision/safety;Ambulation;Regular Glass blower/designer Details (indicate cue type and reason): pt hovering over toilet without issue and without LOB Toileting- Clothing Manipulation and Hygiene: Modified independent;Sitting/lateral lean;Sit to/from stand       Functional mobility during ADLs: Supervision/safety;Rolling walker (2 wheels) General ADL Comments: appears to be at baseline for ADLs/mobility w/ light assist only to maximize safety with room/DME contrainsts. able to mobilize out in hall to/from bathroom using RW without LOB though does endorse recent RLE deficits     Vision Baseline Vision/History: 1 Wears glasses Ability to See in Adequate Light: 0 Adequate Patient Visual Report: No change  from baseline Vision Assessment?: No apparent visual deficits     Perception     Praxis      Pertinent Vitals/Pain Pain Assessment Pain Assessment: No/denies pain     Hand Dominance Right   Extremity/Trunk  Assessment Upper Extremity Assessment Upper Extremity Assessment: Overall WFL for tasks assessed   Lower Extremity Assessment Lower Extremity Assessment: Defer to PT evaluation   Cervical / Trunk Assessment Cervical / Trunk Assessment: Normal   Communication Communication Communication: No difficulties   Cognition Arousal/Alertness: Awake/alert Behavior During Therapy: WFL for tasks assessed/performed Overall Cognitive Status: Within Functional Limits for tasks assessed                                       General Comments       Exercises     Shoulder Instructions      Home Living Family/patient expects to be discharged to:: Private residence Living Arrangements: Alone Available Help at Discharge: Family;Available PRN/intermittently Type of Home: Apartment Home Access: Elevator     Home Layout: One level               Home Equipment: Rollator (4 wheels);Grab bars - tub/shower;Grab bars - toilet   Additional Comments: senior apt w/ call lights in bathroom and bedroom      Prior Functioning/Environment Prior Level of Function : Independent/Modified Independent             Mobility Comments: use of rollator for mobility ADLs Comments: MOD I for ADLs, enjoys being active/social and enjoys fashion        OT Problem List: Impaired balance (sitting and/or standing)      OT Treatment/Interventions:      OT Goals(Current goals can be found in the care plan section) Acute Rehab OT Goals Patient Stated Goal: get back home, get ready for christmas OT Goal Formulation: All assessment and education complete, DC therapy  OT Frequency:      Co-evaluation              AM-PAC OT "6 Clicks" Daily Activity     Outcome Measure Help from another person eating meals?: None Help from another person taking care of personal grooming?: None Help from another person toileting, which includes using toliet, bedpan, or urinal?: None Help from  another person bathing (including washing, rinsing, drying)?: None Help from another person to put on and taking off regular upper body clothing?: None Help from another person to put on and taking off regular lower body clothing?: A Little 6 Click Score: 23   End of Session Equipment Utilized During Treatment: Rolling walker (2 wheels);Gait belt Nurse Communication: Mobility status  Activity Tolerance: Patient tolerated treatment well Patient left: Other (comment) (to ambulate with PT)  OT Visit Diagnosis: Other abnormalities of gait and mobility (R26.89)                Time: 7564-3329 OT Time Calculation (min): 20 min Charges:  OT General Charges $OT Visit: 1 Visit OT Evaluation $OT Eval Low Complexity: 1 Low  Bradd Canary, OTR/L Acute Rehab Services Office: [phone]   Lorre Munroe 12/19/2021, 12:28 PM

## 2021-12-19 NOTE — Progress Notes (Signed)
Physical Therapy Evaluation Patient Details Name: Devorah Llamas MRN: [id number] DOB: 1950/03/25 Today's Date: 12/19/2021  History of Present Illness  Pt is a 71 y/o female who presented 12/8 w/ near syncope in setting of a fib with RVR. PMH: CHF, a fib, DM2, hx of embolic stroke requiring mechanical thrombectomy w/ cardiogenic shock and PEA arrest.  Clinical Impression  Pt was seen for mobility after initiating gait to BR on RW but is quite energetic considering cardiac capacity.  Pt is likely close to baseline and will recommend her to follow acutely with PT to work on increasing her mobility as well as with MT's, and will progress to transition care to PCP.  Pt is not demonstrating a difference that will require follow up with home therapy, and so will just follow acutely with goals on POC below.  Encourage her to walk with staff as well to reduce her fall risk and promote stability with less reliance on an AD.       Recommendations for follow up therapy are one component of a multi-disciplinary discharge planning process, led by the attending physician.  Recommendations may be updated based on patient status, additional functional criteria and insurance authorization.  Follow Up Recommendations No PT follow up      Assistance Recommended at Discharge Set up Supervision/Assistance  Patient can return home with the following  Assistance with cooking/housework;Assist for transportation    Equipment Recommendations None recommended by PT  Recommendations for Other Services       Functional Status Assessment Patient has had a recent decline in their functional status and demonstrates the ability to make significant improvements in function in a reasonable and predictable amount of time.     Precautions / Restrictions Precautions Precautions: Fall;Other (comment) Precaution Comments: monitor HR Restrictions Weight Bearing Restrictions: No      Mobility  Bed  Mobility Overal bed mobility: Modified Independent                  Transfers Overall transfer level: Modified independent Equipment used: Rolling walker (2 wheels)               General transfer comment: pt is aware of safety on RW    Ambulation/Gait Ambulation/Gait assistance: Supervision Gait Distance (Feet): 80 Feet Assistive device: Rolling walker (2 wheels) Gait Pattern/deviations: Step-through pattern, Decreased stride length, Narrow base of support Gait velocity: controlled Gait velocity interpretation: <1.31 ft/sec, indicative of household ambulator Pre-gait activities: standing balance ck General Gait Details: pt is walking with fairly good control of balance and tends to set walker aside near the chair  Stairs            Wheelchair Mobility    Modified Rankin (Stroke Patients Only)       Balance Overall balance assessment: Needs assistance Sitting-balance support: Feet supported Sitting balance-Leahy Scale: Good     Standing balance support: Bilateral upper extremity supported, No upper extremity supported, During functional activity Standing balance-Leahy Scale: Fair Standing balance comment: fair static, fair- dynamic                             Pertinent Vitals/Pain Pain Assessment Pain Assessment: No/denies pain    Home Living Family/patient expects to be discharged to:: Private residence Living Arrangements: Alone Available Help at Discharge: Family;Available PRN/intermittently Type of Home: Apartment Home Access: Elevator       Home Layout: One level Home Equipment: Rollator (4 wheels);Grab bars -  tub/shower;Grab bars - toilet Additional Comments: senior apt w/ call lights in bathroom and bedroom    Prior Function Prior Level of Function : Independent/Modified Independent             Mobility Comments: rollator at baseline       Hand Dominance   Dominant Hand: Right    Extremity/Trunk Assessment    Upper Extremity Assessment Upper Extremity Assessment: Defer to OT evaluation    Lower Extremity Assessment Lower Extremity Assessment: Overall WFL for tasks assessed    Cervical / Trunk Assessment Cervical / Trunk Assessment: Normal  Communication   Communication: No difficulties  Cognition Arousal/Alertness: Awake/alert Behavior During Therapy: WFL for tasks assessed/performed Overall Cognitive Status: Within Functional Limits for tasks assessed                                          General Comments General comments (skin integrity, edema, etc.): Pt was seen for progressing her mobility on walker and limited her due to monitoring vitals, but HR was in normal range as was O2 sat    Exercises     Assessment/Plan    PT Assessment Patient needs continued PT services  PT Problem List Decreased balance;Decreased coordination       PT Treatment Interventions DME instruction;Gait training;Functional mobility training;Therapeutic activities;Therapeutic exercise;Balance training;Neuromuscular re-education;Patient/family education    PT Goals (Current goals can be found in the Care Plan section)  Acute Rehab PT Goals Patient Stated Goal: to feel stronger and get home PT Goal Formulation: With patient Time For Goal Achievement: 12/26/21 Potential to Achieve Goals: Good    Frequency Min 3X/week     Co-evaluation               AM-PAC PT "6 Clicks" Mobility  Outcome Measure Help needed turning from your back to your side while in a flat bed without using bedrails?: None Help needed moving from lying on your back to sitting on the side of a flat bed without using bedrails?: A Little Help needed moving to and from a bed to a chair (including a wheelchair)?: A Little Help needed standing up from a chair using your arms (e.g., wheelchair or bedside chair)?: A Little Help needed to walk in hospital room?: A Little Help needed climbing 3-5 steps with a  railing? : A Little 6 Click Score: 19    End of Session Equipment Utilized During Treatment: Gait belt Activity Tolerance: Patient limited by fatigue Patient left: in bed;with call bell/phone within reach Nurse Communication: Mobility status PT Visit Diagnosis: Unsteadiness on feet (R26.81);Difficulty in walking, not elsewhere classified (R26.2)    Time: 1210-1226 PT Time Calculation (min) (ACUTE ONLY): 16 min   Charges:   PT Evaluation $PT Eval Moderate Complexity: 1 Mod         Ivar Drape 12/19/2021, 5:50 PM  Samul Dada, PT PhD Acute Rehab Dept. Number: [facility] [id number] and [facility] [phone]

## 2021-12-19 NOTE — Progress Notes (Incomplete)
Echocardiogram 2D Echocardiogram has been performed.  [name] [name] 12/19/2021, 8:28 AM

## 2021-12-19 NOTE — ED Notes (Signed)
Patient provided family phone numbers and room phone to call per family request.

## 2021-12-19 NOTE — Care Management Obs Status (Signed)
MEDICARE OBSERVATION STATUS NOTIFICATION   Patient Details  Name: [name] [name] MRN: [id number] Date of Birth: 07-Oct-1950   Medicare Observation Status Notification Given:  Yes    Merryn Thaker G., RN 12/19/2021, 2:57 PM

## 2021-12-20 DIAGNOSIS — I480 Paroxysmal atrial fibrillation: Secondary | ICD-10-CM | POA: Diagnosis not present

## 2021-12-20 DIAGNOSIS — I4891 Unspecified atrial fibrillation: Secondary | ICD-10-CM | POA: Diagnosis not present

## 2021-12-20 LAB — GLUCOSE, CAPILLARY
Glucose-Capillary: 254 mg/dL — ABNORMAL HIGH (ref 70–99)
Glucose-Capillary: 130 mg/dL — ABNORMAL HIGH (ref 70–99)
Glucose-Capillary: 236 mg/dL — ABNORMAL HIGH (ref 70–99)

## 2021-12-20 LAB — BASIC METABOLIC PANEL
Anion gap: 10 (ref 5–15)
BUN: 16 mg/dL (ref 8–23)
CO2: 22 mmol/L (ref 22–32)
Calcium: 9 mg/dL (ref 8.9–10.3)
Chloride: 107 mmol/L (ref 98–111)
Creatinine, Ser: 0.98 mg/dL (ref 0.44–1.00)
GFR, Estimated: >60 mL/min (ref >60)
Glucose, Bld: 182 mg/dL — ABNORMAL HIGH (ref 70–99)
Potassium: 4.1 mmol/L (ref 3.5–5.1)
Sodium: 139 mmol/L (ref 135–145)

## 2021-12-20 MED ORDER — ACETAMINOPHEN ER 650 MG PO TBCR: 650.0000 mg | EXTENDED_RELEASE_TABLET | Freq: Three times a day (TID) | ORAL | Status: DC | PRN

## 2021-12-20 MED ORDER — METOPROLOL SUCCINATE ER 50 MG PO TB24: 50.0000 mg | ORAL_TABLET | Freq: Every day | ORAL | 0 refills | Status: DC

## 2021-12-20 MED ORDER — AMIODARONE HCL 200 MG PO TABS: 200.0000 mg | ORAL_TABLET | Freq: Every day | ORAL | 0 refills | Status: DC

## 2021-12-20 MED ORDER — APIXABAN 5 MG PO TABS: 5.0000 mg | ORAL_TABLET | Freq: Two times a day (BID) | ORAL | 0 refills | Status: DC

## 2021-12-20 NOTE — Consult Note (Signed)
CARDIOLOGY CONSULT NOTE  Patient ID: Filippa Yarbough MRN: [id number] DOB/AGE: 03/24/1950 71 y.o.  Admit date: 12/18/2021 Referring Physician  Dr Pollie Meyer Primary Physician:  Cora Collum, DO Reason for Consultation  Afib RVR  Patient ID: Roby Spalla, female    DOB: 1950-10-26, 71 y.o.   MRN: [id number]  Chief Complaint  Patient presents with   tachycardic   HPI:    Liara Holm  is a 71 y.o. female who presented to the hospital via EMS in A-fib with RVR but she spontaneously converted to NSR once reaching the ED.  Patient has a history of chronic heart failure with preserved ejection fraction with diastolic dysfunction, paroxysmal A-fib, patient states she is no longer on her Eliquis, type 2 diabetes with insulin dependence and history embolic stroke requiring tPA and mechanical thrombectomy at which time she had cardiogenic shock and PEA arrest following thrombectomy.  Formerly and ejection but recovered cardiomyopathy no longer on digoxin since in June of this year.  Patient's granddaughter called EMS after the patient stated she had a funny feeling in her chest. She also endorsed feeling light-headed and weak at that time. Patient feels well this morning and she is ready to go home. She denies chest pain, shortness of breath, palpitations, diaphoresis, syncope.  Past Medical History:  Diagnosis Date   Abdominal pain    Abnormal chest x-ray 02/08/2021   Acute respiratory failure with hypoxia (HCC)    Atrial flutter (HCC)    Atrial tachycardia 07/29/2019   Back pain    Back pain 11/15/2014   Bilateral leg edema 03/18/2020   Chronic anticoagulation - on Eliquis for chronic afib 02/04/2020   Chronic combined systolic and diastolic CHF (congestive heart failure) (HCC) 02/04/2020   Chronic heart failure with preserved ejection fraction (HFpEF) (HCC) 03/18/2020   LVEF less than 20% (May 2022), LVEF 55 to 60% (September 2022)   Constipation    Contact  dermatitis 08/12/2020   COVID-19 02/04/2020   Diabetes mellitus without complication (HCC)    Diabetes mellitus without complication (HCC)    Dysphagia 02/03/2020   Epigastric pain 02/03/2020   Fatigue    Fatigue    Full dentures    Full dentures    Hemorrhoids    History of esophageal stricture 07/29/2019   History of noncompliance with medical treatment, presenting hazards to health    Hospital discharge follow-up 07/27/2020   Hyperlipidemia    Hypertension    Intertrigo 09/04/2020   Lack of adequate sleep    Lack of adequate sleep    Liver mass    Liver mass    Morbid obesity (HCC) 02/03/2020   Nonrheumatic tricuspid valve regurgitation 03/18/2020   Oral thrush 02/04/2020   Osteopenia after menopause 06/09/2017   PAF (paroxysmal atrial fibrillation) (HCC)    Pericardial effusion 05/12/2010   Pleural effusion 06/07/2020   Pleural effusion on left    QT prolongation    Rash    on right breast   Right upper quadrant abdominal pain    Stroke (cerebrum) (HCC) 05/12/2020   Uncontrolled type 2 diabetes mellitus with hyperglycemia (HCC) 06/08/2017   Wears glasses    Wears glasses    Past Surgical History:  Procedure Laterality Date   BREAST DUCTAL SYSTEM EXCISION     BREAST EXCISIONAL BIOPSY Left    CARDIOVERSION N/A 07/31/2019   Procedure: CARDIOVERSION;  Surgeon: Chrystie Nose, MD;  Location: [facility] ENDOSCOPY;  Service: Cardiovascular;  Laterality: N/A;   CARDIOVERSION N/A 05/19/2020  Procedure: CARDIOVERSION;  Surgeon: Jolaine Artist, MD;  Location: [place];  Service: Cardiovascular;  Laterality: N/A;   CHOLECYSTECTOMY     CHOLECYSTECTOMY, LAPAROSCOPIC  07/24/2010   ESOPHAGEAL DILATION     IR CT HEAD LTD  05/12/2020   IR PERCUTANEOUS ART THROMBECTOMY/INFUSION INTRACRANIAL INC DIAG ANGIO  05/12/2020       IR PERCUTANEOUS ART THROMBECTOMY/INFUSION INTRACRANIAL INC DIAG ANGIO  05/12/2020   IR US GUIDE VASC ACCESS RIGHT  05/12/2020   RADIOLOGY WITH ANESTHESIA N/A 05/12/2020    Procedure: IR WITH ANESTHESIA;  Surgeon: Radiologist, Medication, MD;  Location: [place];  Service: Radiology;  Laterality: N/A;   TEE WITHOUT CARDIOVERSION N/A 07/31/2019   Procedure: TRANSESOPHAGEAL ECHOCARDIOGRAM (TEE);  Surgeon: Pixie Casino, MD;  Location: [facility] ENDOSCOPY;  Service: Cardiovascular;  Laterality: N/A;   TEE WITHOUT CARDIOVERSION N/A 05/19/2020   Procedure: TRANSESOPHAGEAL ECHOCARDIOGRAM (TEE);  Surgeon: Jolaine Artist, MD;  Location: [facility] ENDOSCOPY;  Service: Cardiovascular;  Laterality: N/A;   Social History   Tobacco Use   Smoking status: Never    Passive exposure: Never   Smokeless tobacco: Never  Substance Use Topics   Alcohol use: No    Alcohol/week: 0.0 standard drinks of alcohol    Family History  Problem Relation Age of Onset   Stroke Mother    Other Father        broken heart after spouse spouse   Diabetes Sister    Hypertension Sister    Diabetes Sister    Multiple sclerosis Sister    Alcohol abuse Other    Diabetes Other    Breast cancer Neg Hx     Marital Status: Single  ROS  Review of Systems  Cardiovascular:  Negative for chest pain, claudication, cyanosis, dyspnea on exertion, irregular heartbeat, leg swelling, near-syncope, orthopnea, palpitations, paroxysmal nocturnal dyspnea and syncope.   Objective      12/20/2021   11:23 AM 12/20/2021    7:41 AM 12/19/2021   11:00 PM  Vitals with BMI  Systolic [id number]  Diastolic 89 82 77  Pulse  74 70    Blood pressure (!) 161/89, pulse 74, temperature 98.2 F (36.8 C), temperature source Oral, resp. rate 20, height 5\' 3"  (1.6 m), weight 81.6 kg, SpO2 94 %.    Physical Exam Vitals reviewed.  HENT:     Head: Normocephalic and atraumatic.  Cardiovascular:     Rate and Rhythm: Normal rate and regular rhythm.     Pulses: Normal pulses.     Heart sounds: Normal heart sounds.  Pulmonary:     Effort: Pulmonary effort is normal.     Breath sounds: Normal breath sounds.  Abdominal:      General: Bowel sounds are normal.  Musculoskeletal:     Right lower leg: No edema.     Left lower leg: No edema.  Skin:    General: Skin is warm and dry.  Neurological:     Mental Status: She is alert.    Laboratory examination:   Recent Labs    05/11/21 1635 12/18/21 2225 12/19/21 0321 12/20/21 0014  NA 140 137 139 139  K 4.3 5.6* 4.3 4.1  CL 101 102 106 107  CO2 24  --  23 22  GLUCOSE 275* 382* 279* 182*  BUN 18 33* 20 16  CREATININE 1.03* 1.10* 1.04* 0.98  CALCIUM 9.7  --  9.2 9.0  GFRNONAA  --   --  58* >60   estimated creatinine clearance is  54.1 mL/min (by C-G formula based on SCr of 0.98 mg/dL).     Latest Ref Rng & Units 12/20/2021   12:14 AM 12/19/2021    3:21 AM 12/18/2021   10:25 PM  CMP  Glucose 70 - 99 mg/dL 182  279  382   BUN 8 - 23 mg/dL 16  20  33   Creatinine 0.44 - 1.00 mg/dL 0.98  1.04  1.10   Sodium 135 - 145 mmol/L 139  139  137   Potassium 3.5 - 5.1 mmol/L 4.1  4.3  5.6   Chloride 98 - 111 mmol/L 107  106  102   CO2 22 - 32 mmol/L 22  23    Calcium 8.9 - 10.3 mg/dL 9.0  9.2        Latest Ref Rng & Units 12/18/2021   10:25 PM 12/18/2021   10:15 PM 03/12/2021   11:00 AM  CBC  WBC 4.0 - 10.5 K/uL  8.6    Hemoglobin 12.0 - 15.0 g/dL 13.6  12.6  12.1   Hematocrit 36.0 - 46.0 % 40.0  39.7  39.5   Platelets 150 - 400 K/uL  188     Lipid Panel Recent Labs    05/11/21 1635  CHOL 147  TRIG 125  LDLCALC 84  HDL 41  CHOLHDL 3.6    HEMOGLOBIN A1C Lab Results  Component Value Date   HGBA1C 12.5 (A) 05/11/2021   MPG 254.65 05/13/2020   TSH No results for input(s): "TSH" in the last 8760 hours. BNP (last 3 results) Recent Labs    12/18/21 2215  BNP 91.4   Cardiac Panel (last 3 results) Recent Labs    12/19/21 0541 12/19/21 1245 12/19/21 1518  TROPONINIHS 31* 29* 25*     Medications and allergies   Allergies  Allergen Reactions   Food Shortness Of Breath, Swelling and Other (See Comments)    Fruit - tongue swelling/numbness  (ok with organic fruit) Tree nuts - tongue swelling/numbness (ok with organic nuts)   Pacerone [Amiodarone] Shortness Of Breath and Other (See Comments)    Myalgias  Wheezing  Asthenia    Peanut (Diagnostic) Shortness Of Breath and Swelling    Tongue swelling/numbness   Toprol Xl [Metoprolol] Shortness Of Breath, Other (See Comments) and Cough    Myalgias Wheezing Asthenia    Asa [Aspirin] Nausea And Vomiting and Other (See Comments)    Told to avoid due to liver issues   Lactose Intolerance (Gi) Diarrhea and Nausea And Vomiting     Current Meds  Medication Sig   acetaminophen (TYLENOL) 650 MG CR tablet Take 650 mg by mouth 3 (three) times daily.   amLODipine (NORVASC) 10 MG tablet Take 1 tablet (10 mg total) by mouth at bedtime.   atorvastatin (LIPITOR) 80 MG tablet TAKE 1 TABLET(80 MG) BY MOUTH DAILY (Patient taking differently: Take 80 mg by mouth in the morning.)   docusate sodium (COLACE) 100 MG capsule Take 400 mg by mouth in the morning.   insulin glargine (LANTUS SOLOSTAR) 100 UNIT/ML Solostar Pen ADMINISTER 25 UNITS UNDER THE SKIN TWICE DAILY (Patient taking differently: Inject 20-26 Units into the skin See admin instructions. Inject 26 units every morning, 20 units at bedtime)   pantoprazole (PROTONIX) 40 MG tablet TAKE 1 TABLET(40 MG) BY MOUTH DAILY (Patient taking differently: Take 40 mg by mouth in the morning.)    Scheduled Meds:  amLODipine  10 mg Oral QHS   atorvastatin  80 mg Oral Daily  enoxaparin (LOVENOX) injection  40 mg Subcutaneous Q24H   insulin aspart  0-9 Units Subcutaneous TID WC   insulin glargine-yfgn  20 Units Subcutaneous QHS   pantoprazole  40 mg Oral Daily   Continuous Infusions: PRN Meds:.acetaminophen **OR** acetaminophen, mouth rinse   I/O last 3 completed shifts: In: 360 [P.O.:360] Out: -  No intake/output data recorded.  Net IO Since Admission: 360 mL [12/20/21 1154]   Radiology:   Imaging results have been reviewed and  ECHOCARDIOGRAM COMPLETE  Result Date: 12/19/2021    ECHOCARDIOGRAM REPORT   Patient Name:   LUCY LITTY Date of Exam: 12/19/2021 Medical Rec #:  [id number]               Height:       63.0 in Accession #:    [id number]              Weight:       180.0 lb Date of Birth:  1950-05-04              BSA:          1.849 m Patient Age:    17 years                BP:           140/80 mmHg Patient Gender: F                       HR:           80 bpm. Exam Location:  Inpatient Procedure: 2D Echo, Cardiac Doppler and Color Doppler Indications:    Abnormal ECG R94.31  History:        Patient has prior history of Echocardiogram examinations, most                 recent 10/19/2020. CHF, Arrythmias:Atrial Fibrillation; Risk                 Factors:Hypertension, Diabetes and Dyslipidemia.  Sonographer:    Ronny Flurry Referring Phys: [place]  1. Left ventricular ejection fraction, by estimation, is 55 to 60%. The left ventricle has normal function. The left ventricle has no regional wall motion abnormalities. There is moderate concentric left ventricular hypertrophy. Left ventricular diastolic parameters are consistent with Grade III diastolic dysfunction (restrictive).  2. Right ventricular systolic function is normal. The right ventricular size is normal. Tricuspid regurgitation signal is inadequate for assessing PA pressure.  3. A small pericardial effusion is present. There is no evidence of cardiac tamponade.  4. The mitral valve is grossly normal. Trivial mitral valve regurgitation. No evidence of mitral stenosis.  5. The aortic valve is tricuspid. Aortic valve regurgitation is not visualized. No aortic stenosis is present.  6. There is mild dilatation of the aortic root, measuring 41 mm.  7. The inferior vena cava is normal in size with greater than 50% respiratory variability, suggesting right atrial pressure of 3 mmHg. Comparison(s): No significant change from prior study.  FINDINGS  Left Ventricle: Left ventricular ejection fraction, by estimation, is 55 to 60%. The left ventricle has normal function. The left ventricle has no regional wall motion abnormalities. The left ventricular internal cavity size was normal in size. There is  moderate concentric left ventricular hypertrophy. Left ventricular diastolic parameters are consistent with Grade III diastolic dysfunction (restrictive). Right Ventricle: The right ventricular size is normal. No increase in right ventricular wall thickness. Right ventricular systolic function is  normal. Tricuspid regurgitation signal is inadequate for assessing PA pressure. Left Atrium: Left atrial size was normal in size. Right Atrium: Right atrial size was normal in size. Pericardium: A small pericardial effusion is present. There is no evidence of cardiac tamponade. Mitral Valve: The mitral valve is grossly normal. Trivial mitral valve regurgitation. No evidence of mitral valve stenosis. Tricuspid Valve: The tricuspid valve is grossly normal. Tricuspid valve regurgitation is trivial. No evidence of tricuspid stenosis. Aortic Valve: The aortic valve is tricuspid. Aortic valve regurgitation is not visualized. No aortic stenosis is present. Aortic valve mean gradient measures 4.0 mmHg. Aortic valve peak gradient measures 7.5 mmHg. Aortic valve area, by VTI measures 2.27 cm. Pulmonic Valve: The pulmonic valve was grossly normal. Pulmonic valve regurgitation is trivial. No evidence of pulmonic stenosis. Aorta: The aortic root and ascending aorta are structurally normal, with no evidence of dilitation. There is mild dilatation of the aortic root, measuring 41 mm. Venous: The inferior vena cava is normal in size with greater than 50% respiratory variability, suggesting right atrial pressure of 3 mmHg. IAS/Shunts: The atrial septum is grossly normal.  LEFT VENTRICLE PLAX 2D LVIDd:         4.00 cm   Diastology LVIDs:         2.70 cm   LV e' medial:    5.17  cm/s LV PW:         1.50 cm   LV E/e' medial:  23.6 LV IVS:        1.60 cm   LV e' lateral:   8.76 cm/s LVOT diam:     2.10 cm   LV E/e' lateral: 13.9 LV SV:         64 LV SV Index:   35 LVOT Area:     3.46 cm  RIGHT VENTRICLE             IVC RV S prime:     11.70 cm/s  IVC diam: 1.70 cm TAPSE (M-mode): 1.4 cm LEFT ATRIUM             Index        RIGHT ATRIUM           Index LA diam:        4.30 cm 2.33 cm/m   RA Area:     11.30 cm LA Vol (A2C):   43.6 ml 23.58 ml/m  RA Volume:   22.50 ml  12.17 ml/m LA Vol (A4C):   44.4 ml 24.01 ml/m LA Biplane Vol: 48.4 ml 26.18 ml/m  AORTIC VALVE AV Area (Vmax):    2.31 cm AV Area (Vmean):   2.28 cm AV Area (VTI):     2.27 cm AV Vmax:           137.00 cm/s AV Vmean:          96.300 cm/s AV VTI:            0.284 m AV Peak Grad:      7.5 mmHg AV Mean Grad:      4.0 mmHg LVOT Vmax:         91.20 cm/s LVOT Vmean:        63.400 cm/s LVOT VTI:          0.186 m LVOT/AV VTI ratio: 0.65  AORTA Ao Root diam: 4.10 cm Ao Asc diam:  3.10 cm MITRAL VALVE MV Area (PHT): 4.06 cm     SHUNTS MV Decel Time: 187 msec  Systemic VTI:  0.19 m MV E velocity: 122.00 cm/s  Systemic Diam: 2.10 cm MV A velocity: 49.30 cm/s MV E/A ratio:  2.47 Eleonore Chiquito MD Electronically signed by Eleonore Chiquito MD Signature Date/Time: 12/19/2021/11:16:30 AM    Final    DG Chest Portable 1 View  Result Date: 12/18/2021 CLINICAL DATA:  Chest pain, tachycardia EXAM: PORTABLE CHEST 1 VIEW COMPARISON:  03/04/2021 FINDINGS: Single frontal view of the chest demonstrates an enlarged cardiac silhouette, stable. No acute airspace disease, effusion, or pneumothorax. Chronic central vascular congestion. No acute bony abnormalities. IMPRESSION: 1. Stable enlarged cardiac silhouette and chronic central vascular congestion. No acute airspace disease. Electronically Signed   By: Randa Ngo M.D.   On: 12/18/2021 23:07    Cardiac Studies:   EMS rhythm strip demonstrating Afib RVR with heart rate in 180s with  spontaneous conversion to NSR upon arrival to ED.   EKG: 08/28/2020: Normal sinus rhythm, 74 bpm, diffuse ST-T changes cannot rule out underlying ischemia.  Similar findings on prior EKG dated 06/07/2020.   Echocardiogram: 05/12/20:  LVEF <20%, severely reduced LVEF, global hypokinesis, severe LVH, no LV thrombus visualized, diastolic dysfunction is indeterminate due to atrial fibrillation, RV size mildly dilated, RV systolic function severely reduced RVSP 40.6 mmHg, large circumferential pericardial effusion, IVC dilated and not collapsible, mild MR, moderate to severe TR, trivial AR aortic valve sclerosis without stenosis left atrium mildly dilated.     09/30/2020: 1. Normal LV systolic function with visual EF 55-60%. Left ventricle cavity is normal in size. Moderate left ventricular hypertrophy. Normal global wall motion.Doppler evidence of grade III (restrictive) diastolic dysfunction, elevated LAP. 2. Left atrial cavity is mildly dilated. 3. Mild (Grade I) mitral regurgitation. 4. Mild tricuspid regurgitation. No evidence of pulmonary hypertension. RVSP measures 33 mmHg. 5. Mild pulmonic regurgitation. 6. Small pericardial effusion, located posteriorly, without hemodynamic significance. 7. IVC is normal with a respiratory response of <50%. 8. Compared to study 05/12/2020 LVEF improved from <20% to 55-60%, RV function has improved, pulmonary hypertension has resolved prior RVSP 40.68mmHg and now 11mmHg, pericardial effusion was large and now small in size, Moderate / severe TR is now mild. Otherwise, no significant change.   EKG: 12/18/2021: normal sinus rhythm with LVH, ST-T wave changes secondary to hypertrophy   Assessment & Recommendations:   Kandiss Holyoak is a 71 y.o. female who presented with A-fib RVR. Pertinent PMH/PSH includes A-fib/a flutter, HFimpEF, grade 3 diastolic dysfunction, diabetes, hypertension, tricuspid regurg, esophageal stricture, and prior embolic stroke  which required tPA, mechanical thrombectomy and resulted in cardiogenic shock with PEA arrest.    Afib RVR, currently normal sinus rhythm Continue PO amio, Eliquis, and Toprol XL Patient stable for discharge from cardiac standpoint Discussed with primary team [place] flat.  Echo unchanged from prior.  Patient to follow-up with me in office within 1-2 weeks  EMS rhythm strip demonstrating Afib RVR, heart rate in 180s, with spontaneous conversion to NSR upon arrival to ED.         Floydene Flock, DO, [facility] 12/20/2021, 11:54 AM Office: [phone]

## 2021-12-20 NOTE — Discharge Instructions (Addendum)
Dear [name],   Thank you for letting us participate in your care! In this section, you will find a brief summary of why you were admitted to the hospital, what happened during your admission, your diagnosis/diagnoses, and recommended follow up.  You were admitted because you were experiencing dizziness, sweating, and nearly passing out.  Your testing revealed atrial fibrillation with a fast heart rate.  You were also seen by the cardiology team. They recommended you resume Eliquis and amiodarone and also start a beta blocker.  Your heart rate improved and you were discharged from the hospital for meeting this goal.    POST-HOSPITAL & CARE INSTRUCTIONS Your cardiology office should be reaching out about an appointment next week. If you don't hear in 2 days please call them. Please see medications section of this packet for any medication changes. Please let PCP/Specialists know of any changes in medications that were made.    DOCTOR'S APPOINTMENTS & FOLLOW UP Future Appointments  Date Time Provider Department Center  01/06/2022  3:00 PM Dana Allan, MD [facility]     Thank you for choosing [facility]! Take care and be well!  Family Medicine Teaching Service Inpatient Team [place]  [facility]  [address] [place], [place] [address] [phone]

## 2021-12-20 NOTE — Discharge Summary (Addendum)
Family Medicine Teaching Service [facility] Discharge Summary  Patient name: [name] [name] Medical record number: [id number] Date of birth: May 09, 1950 Age: 71 y.o. Gender: female Date of Admission: 12/18/2021  Date of Discharge: 12/20/2021 Admitting Physician: Cora Collum, DO  Primary Care Provider: Cora Collum, DO Consultants: None  Indication for Hospitalization: A-fib with RVR  Discharge Diagnoses/Problem List:  Principal Problem:   Atrial fibrillation with RVR [facility])   Brief Hospital Course:  [name] [name] is a 71 y.o. female with history of afib/flutter no longer on Eliquis admitted for Afib w/ RVR. Other pertinent PMH includes HFimpEF, G3DD, diabetes, hypertension, tricuspid regurg, h/o esophageal stricture, and prior stroke. Notably, prior stroke was embolic, required TPA, mechanical thrombectomy and developed cardiogenic shock and PEA arrest  Atrial fibrillation with RVR Patient presented due to episode of near syncope where she felt sweaty, dizzy. Granddaughter checked pulse and called EMS as it was elevated.  EMS rhythm strip showed A-fib with heart rate in 180s but patient spontaneously converted to NSR upon arrival.  She remained in NSR overnight.  Trops trended and were flat, echo with EF 55 to 60%, G3DD (unchanged from prior). Cardiology was consulted due to patient's complex history and they recommended resuming Amiodarone 200mg  daily and Eliquis 5mg  BID (both of which patient self-discontinued previously). Cardiology also recommended starting Toprol 50mg  daily. They will arrange follow up next week.  Disposition: Home  Discharge Condition: Stable, improved  Discharge Exam:  Vitals:   12/20/21 0741 12/20/21 1123  BP: 139/82 (!) 161/89  Pulse: 74   Resp: 17 20  Temp: 98.1 F (36.7 C) 98.2 F (36.8 C)  SpO2: 92% 94%   Gen: NAD, pleasant, able to participate in exam CV: RRR, normal S1/S2, no murmur Resp: Normal effort on room air,  lungs CTAB Extremities: no edema or cyanosis Neuro: alert, no obvious focal deficits, ambulating in room Psych: Normal affect and mood  Significant Procedures: None  Significant Labs and Imaging:  Recent Labs  Lab 12/18/21 2215 12/18/21 2225  WBC 8.6  --   HGB 12.6 13.6  HCT 39.7 40.0  PLT 188  --    Recent Labs  Lab 12/18/21 2225 12/19/21 0321 12/20/21 0014  NA 137 139 139  K 5.6* 4.3 4.1  CL 102 106 107  CO2  --  23 22  GLUCOSE 382* 279* 182*  BUN 33* 20 16  CREATININE 1.10* 1.04* 0.98  CALCIUM  --  9.2 9.0   ECHOCARDIOGRAM COMPLETE Result Date: 12/19/2021 IMPRESSIONS   1. Left ventricular ejection fraction, by estimation, is 55 to 60%. The left ventricle has normal function. The left ventricle has no regional wall motion abnormalities. There is moderate concentric left ventricular hypertrophy. Left ventricular diastolic parameters are consistent with Grade III diastolic dysfunction (restrictive).   2. Right ventricular systolic function is normal. The right ventricular size is normal. Tricuspid regurgitation signal is inadequate for assessing PA pressure.   3. A small pericardial effusion is present. There is no evidence of cardiac tamponade.   4. The mitral valve is grossly normal. Trivial mitral valve regurgitation. No evidence of mitral stenosis.   5. The aortic valve is tricuspid. Aortic valve regurgitation is not visualized. No aortic stenosis is present.  6. There is mild dilatation of the aortic root, measuring 41 mm.   7. The inferior vena cava is normal in size with greater than 50% respiratory variability, suggesting right atrial pressure of 3 mmHg. Comparison(s): No significant change from prior study.  Electronically signed by Eleonore Chiquito MD Signature Date/Time: 12/19/2021/11:16:30 AM    Final    DG Chest Portable 1 View Result Date: 12/18/2021 IMPRESSION: 1. Stable enlarged cardiac silhouette and chronic central vascular congestion. No acute airspace  disease. Electronically Signed   By: Randa Ngo M.D.   On: 12/18/2021 23:07     Results/Tests Pending at Time of Discharge: Hemoglobin A1c  Discharge Medications:  Allergies as of 12/20/2021       Reactions   Food Shortness Of Breath, Swelling, Other (See Comments)   Fruit - tongue swelling/numbness (ok with organic fruit) Tree nuts - tongue swelling/numbness (ok with organic nuts)   Pacerone [amiodarone] Shortness Of Breath, Other (See Comments)   Myalgias  Wheezing  Asthenia    Peanut (diagnostic) Shortness Of Breath, Swelling   Tongue swelling/numbness   Toprol Xl [metoprolol] Shortness Of Breath, Other (See Comments), Cough   Myalgias Wheezing Asthenia    Asa [aspirin] Nausea And Vomiting, Other (See Comments)   Told to avoid due to liver issues   Lactose Intolerance (gi) Diarrhea, Nausea And Vomiting        Medication List     TAKE these medications    acetaminophen 650 MG CR tablet Commonly known as: TYLENOL Take 1 tablet (650 mg total) by mouth every 8 (eight) hours as needed for pain. What changed:  when to take this reasons to take this   amiodarone 200 MG tablet Commonly known as: PACERONE Take 1 tablet (200 mg total) by mouth daily.   amLODipine 10 MG tablet Commonly known as: NORVASC Take 1 tablet (10 mg total) by mouth at bedtime.   apixaban 5 MG Tabs tablet Commonly known as: ELIQUIS Take 1 tablet (5 mg total) by mouth 2 (two) times daily.   atorvastatin 80 MG tablet Commonly known as: LIPITOR TAKE 1 TABLET(80 MG) BY MOUTH DAILY What changed:  how much to take how to take this when to take this additional instructions   docusate sodium 100 MG capsule Commonly known as: COLACE Take 400 mg by mouth in the morning.   Lantus SoloStar 100 UNIT/ML Solostar Pen Generic drug: insulin glargine ADMINISTER 25 UNITS UNDER THE SKIN TWICE DAILY What changed:  how much to take how to take this when to take this additional instructions    metoprolol succinate 50 MG 24 hr tablet Commonly known as: TOPROL-XL Take 1 tablet (50 mg total) by mouth daily. Take with or immediately following a meal.   pantoprazole 40 MG tablet Commonly known as: PROTONIX TAKE 1 TABLET(40 MG) BY MOUTH DAILY What changed:  how much to take how to take this when to take this additional instructions        Discharge Instructions: Please refer to Patient Instructions section of EMR for full details.  Patient was counseled important signs and symptoms that should prompt return to medical care, changes in medications, dietary instructions, activity restrictions, and follow up appointments.   Follow-Up Appointments:  Future Appointments  Date Time Provider [place]  01/06/2022  3:00 PM Carollee Leitz, MD [id number]     Alcus Dad, MD 12/20/2021, 1:06 PM PGY-3, [place]

## 2021-12-20 NOTE — Progress Notes (Signed)
     Daily Progress Note Intern Pager: [phone]  Patient name: [name] [name] Medical record number: [id number] Date of birth: 10-31-1950 Age: 71 y.o. Gender: female  Primary Care Provider: Cora Collum, DO Consultants: Cardiology Code Status: Full  Pt Overview and Major Events to Date:  12/9: Admitted  Assessment and Plan:  [name] [name] is a 71 y.o. female who presented with A-fib RVR. Pertinent PMH/PSH includes A-fib/a flutter, HFimpEF, grade 3 diastolic dysfunction, diabetes, hypertension, tricuspid regurg, esophageal stricture, and prior embolic stroke which required tPA, mechanical thrombectomy and resulted in cardiogenic shock with PEA arrest.    * Atrial fibrillation with RVR (HCC) Remains in NSR this morning.  Trops flat.  Echo unchanged from prior. -Cardiology consulted, appreciate recommendations -Restart Eliquis at discharge -Previously on digoxin and amiodarone-- holding for now, await cards recs -Anticipate discharge home today    FEN/GI: heart healthy diet PPx: Lovenox Dispo:Home today. Barriers include: cardiology evaluation.   Subjective:  No acute events overnight. Patient feels good today and is hoping to discharge home.  Objective: Temp:  [98.1 F (36.7 C)-98.2 F (36.8 C)] 98.1 F (36.7 C) (12/10 0741) Pulse Rate:  [70-85] 74 (12/10 0741) Resp:  [17-19] 17 (12/10 0741) BP: (139-178)/(77-93) 139/82 (12/10 0741) SpO2:  [92 %-94 %] 92 % (12/10 0741) Physical Exam: General: alert, NAD Cardiovascular: RRR, normal S1/S2 Respiratory: normal effort, lungs CTAB Abdomen: soft, NTND Extremities: no edema  Laboratory: Most recent CBC Lab Results  Component Value Date   WBC 8.6 12/18/2021   HGB 13.6 12/18/2021   HCT 40.0 12/18/2021   MCV 81.5 12/18/2021   PLT 188 12/18/2021   Most recent BMP    Latest Ref Rng & Units 12/20/2021   12:14 AM  BMP  Glucose 70 - 99 mg/dL 277   BUN 8 - 23 mg/dL 16   Creatinine 4.12 - 1.00  mg/dL 8.78   Sodium 676 - 720 mmol/L 139   Potassium 3.5 - 5.1 mmol/L 4.1   Chloride 98 - 111 mmol/L 107   CO2 22 - 32 mmol/L 22   Calcium 8.9 - 10.3 mg/dL 9.0     Imaging/Diagnostic Tests: No new results in past 24h.  Maury Dus, MD 12/20/2021, 11:19 AM  PGY-3, [facility] Family Medicine FPTS Intern pager: [phone], text pages welcome Secure chat group [facility] Teaching Service

## 2021-12-20 NOTE — Plan of Care (Signed)

## 2021-12-20 NOTE — Progress Notes (Signed)
Pt requested insulin coverage for Blood sugar 236 before being discharged. It was explained to the pt that she could get insulin coverage but she would be given crackers to take with her incase she felt like her blood sugar was dropping. She stated she understood the risk of being given insulin and not staying to be monitored after.

## 2021-12-21 ENCOUNTER — Telehealth: Payer: Self-pay

## 2021-12-21 LAB — HEMOGLOBIN A1C
Hgb A1c MFr Bld: 10.2 % — ABNORMAL HIGH (ref 4.8–5.6)
Mean Plasma Glucose: 246 mg/dL

## 2021-12-21 NOTE — Telephone Encounter (Signed)
Transition Care Management Unsuccessful Follow-up Telephone Call  Date of discharge and from where:  [facility] 12/20/2021  Attempts:  1st Attempt  Reason for unsuccessful TCM follow-up call:  Left voice message Karena Addison, LPN [facility] Nurse Health Advisor Direct Dial [phone]

## 2021-12-22 NOTE — Telephone Encounter (Signed)
Transition Care Management Follow-up Telephone Call Date of discharge and from where: [facility] 12/20/2021 How have you been since you were released from the hospital? good Any questions or concerns? No  Items Reviewed: Did the pt receive and understand the discharge instructions provided? Yes  Medications obtained and verified? Yes  Other? No  Any new allergies since your discharge? Yes  Dietary orders reviewed? Yes Do you have support at home? Yes   Home Care and Equipment/Supplies: Were home health services ordered? no If so, what is the name of the agency? N/a  Has the agency set up a time to come to the patient's home? not applicable Were any new equipment or medical supplies ordered?  No What is the name of the medical supply agency? N/a Were you able to get the supplies/equipment? not applicable Do you have any questions related to the use of the equipment or supplies? No  Functional Questionnaire: (I = Independent and D = Dependent) ADLs: I  Bathing/Dressing- I  Meal Prep- I  Eating- I  Maintaining continence- I  Transferring/Ambulation- I  Managing Meds- I  Follow up appointments reviewed:  PCP Hospital f/u appt confirmed? Yes  Scheduled to see Dr Idalia Needle on 01/06/2022 @ 3:00. Specialist Hospital f/u appt confirmed? Yes  Scheduled to see Cardio on 01/06/2022 @ 10:00. Are transportation arrangements needed? No  If their condition worsens, is the pt aware to call PCP or go to the Emergency Dept.? Yes Was the patient provided with contact information for the PCP's office or ED? Yes Was to pt encouraged to call back with questions or concerns? Yes Karena Addison, LPN [facility] Nurse Health Advisor Direct Dial [phone]

## 2021-12-30 ENCOUNTER — Ambulatory Visit: Payer: Medicare Other | Admitting: Internal Medicine

## 2021-12-30 ENCOUNTER — Encounter: Payer: Self-pay | Admitting: Internal Medicine

## 2021-12-30 VITALS — BP 183/88 | HR 77 | Ht 63.0 in | Wt 206.2 lb

## 2021-12-30 DIAGNOSIS — I5032 Chronic diastolic (congestive) heart failure: Secondary | ICD-10-CM

## 2021-12-30 DIAGNOSIS — I10 Essential (primary) hypertension: Secondary | ICD-10-CM

## 2021-12-30 DIAGNOSIS — I480 Paroxysmal atrial fibrillation: Secondary | ICD-10-CM

## 2021-12-30 NOTE — Progress Notes (Unsigned)
Primary Physician/Referring:  Shary Key, DO  Patient ID: [name] [name], female    DOB: 05/20/1950, 71 y.o.   MRN: [id number]  Chief Complaint  Patient presents with   Congestive Heart Failure   Follow-up   Hospitalization Follow-up   HPI:    [name] [name]  is a 71 y.o. female with past medical history significant for A-fib, hypertension, and diabetes who is here for hospital follow-up.  Patient was admitted to the hospital for A-fib with RVR and shortness of breath.  Very soon after admission she converted to normal sinus rhythm with medication and blood pressure control.  Her blood pressure is very elevated at today's visit but patient states she is always very very high when she goes to the doctor.  In the hospital every time I saw her her blood pressure was very well-controlled and never this high.  Patient states she has all of her medications and she is compliant with them.  She will keep a blood pressure log and we will enroll her in our RPM program.  She has been doing really well since being discharged from the hospital. Patient denies chest pain, shortness of breath, palpitations, diaphoresis, syncope, edema, PND, orthopnea.   Past Medical History:  Diagnosis Date   Abdominal pain    Abnormal chest x-ray 02/08/2021   Acute respiratory failure with hypoxia (HCC)    Atrial flutter (HCC)    Atrial tachycardia 07/29/2019   Back pain    Back pain 11/15/2014   Bilateral leg edema 03/18/2020   Chronic anticoagulation - on Eliquis for chronic afib 02/04/2020   Chronic combined systolic and diastolic CHF (congestive heart failure) ([place]) 02/04/2020   Chronic heart failure with preserved ejection fraction (HFpEF) ([place]) 03/18/2020   LVEF less than 20% (May 2022), LVEF 55 to 60% (September 2022)   Constipation    Contact dermatitis 08/12/2020   COVID-19 02/04/2020   Diabetes mellitus without complication ([place])    Diabetes mellitus without complication ([place])     Dysphagia 02/03/2020   Epigastric pain 02/03/2020   Fatigue    Fatigue    Full dentures    Full dentures    Hemorrhoids    History of esophageal stricture 07/29/2019   History of noncompliance with medical treatment, presenting hazards to health    Hospital discharge follow-up 07/27/2020   Hyperlipidemia    Hypertension    Intertrigo 09/04/2020   Lack of adequate sleep    Lack of adequate sleep    Liver mass    Liver mass    Morbid obesity ([place]) 02/03/2020   Nonrheumatic tricuspid valve regurgitation 03/18/2020   Oral thrush 02/04/2020   Osteopenia after menopause 06/09/2017   PAF (paroxysmal atrial fibrillation) (HCC)    Pericardial effusion 05/12/2010   Pleural effusion 06/07/2020   Pleural effusion on left    QT prolongation    Rash    on right breast   Right upper quadrant abdominal pain    Stroke (cerebrum) ([place]) 05/12/2020   Uncontrolled type 2 diabetes mellitus with hyperglycemia ([place]) 06/08/2017   Wears glasses    Wears glasses    Past Surgical History:  Procedure Laterality Date   BREAST DUCTAL SYSTEM EXCISION     BREAST EXCISIONAL BIOPSY Left    CARDIOVERSION N/A 07/31/2019   Procedure: CARDIOVERSION;  Surgeon: Pixie Casino, MD;  Location: [facility] ENDOSCOPY;  Service: Cardiovascular;  Laterality: N/A;   CARDIOVERSION N/A 05/19/2020   Procedure: CARDIOVERSION;  Surgeon: Jolaine Artist, MD;  Location: [place];  Service: Cardiovascular;  Laterality: N/A;   CHOLECYSTECTOMY     CHOLECYSTECTOMY, LAPAROSCOPIC  07/24/2010   ESOPHAGEAL DILATION     IR CT HEAD LTD  05/12/2020   IR PERCUTANEOUS ART THROMBECTOMY/INFUSION INTRACRANIAL INC DIAG ANGIO  05/12/2020       IR PERCUTANEOUS ART THROMBECTOMY/INFUSION INTRACRANIAL INC DIAG ANGIO  05/12/2020   IR US GUIDE VASC ACCESS RIGHT  05/12/2020   RADIOLOGY WITH ANESTHESIA N/A 05/12/2020   Procedure: IR WITH ANESTHESIA;  Surgeon: Radiologist, Medication, MD;  Location: [place];  Service: Radiology;  Laterality: N/A;   TEE WITHOUT  CARDIOVERSION N/A 07/31/2019   Procedure: TRANSESOPHAGEAL ECHOCARDIOGRAM (TEE);  Surgeon: Pixie Casino, MD;  Location: [facility] ENDOSCOPY;  Service: Cardiovascular;  Laterality: N/A;   TEE WITHOUT CARDIOVERSION N/A 05/19/2020   Procedure: TRANSESOPHAGEAL ECHOCARDIOGRAM (TEE);  Surgeon: Jolaine Artist, MD;  Location: [facility] ENDOSCOPY;  Service: Cardiovascular;  Laterality: N/A;   Family History  Problem Relation Age of Onset   Stroke Mother    Other Father        broken heart after spouse spouse   Diabetes Sister    Hypertension Sister    Diabetes Sister    Multiple sclerosis Sister    Alcohol abuse Other    Diabetes Other    Breast cancer Neg Hx     Social History   Tobacco Use   Smoking status: Never    Passive exposure: Never   Smokeless tobacco: Never  Substance Use Topics   Alcohol use: No    Alcohol/week: 0.0 standard drinks of alcohol   Marital Status: Single  ROS  Review of Systems  Cardiovascular:  Negative for chest pain, claudication, cyanosis, dyspnea on exertion, irregular heartbeat, leg swelling, orthopnea and palpitations.   Objective  Blood pressure (!) 183/88, pulse 77, height _0  (1.6 m), weight 206 lb 3.2 oz (93.5 kg), SpO2 95 %. Body mass index is 36.53 kg/m.     12/30/2021    3:11 PM 12/30/2021    3:04 PM 12/20/2021   11:23 AM  Vitals with BMI  Height  _1    Weight  206 lbs 3 oz   BMI  93.23   Systolic 557 322 025  Diastolic 88 427 89  Pulse 77 88     Physical Exam Vitals reviewed.  HENT:     Head: Normocephalic and atraumatic.  Cardiovascular:     Rate and Rhythm: Normal rate and regular rhythm.     Pulses: Normal pulses.     Heart sounds: Normal heart sounds. No murmur heard. Pulmonary:     Effort: Pulmonary effort is normal.     Breath sounds: Normal breath sounds.  Abdominal:     General: Bowel sounds are normal.  Musculoskeletal:     Right lower leg: No edema.     Left lower leg: No edema.  Skin:    General: Skin is warm and  dry.  Neurological:     Mental Status: She is alert.     Medications and allergies   Allergies  Allergen Reactions   Food Shortness Of Breath, Swelling and Other (See Comments)    Fruit - tongue swelling/numbness (ok with organic fruit) Tree nuts - tongue swelling/numbness (ok with organic nuts)   Pacerone [Amiodarone] Shortness Of Breath and Other (See Comments)    Myalgias  Wheezing  Asthenia    Peanut (Diagnostic) Shortness Of Breath and Swelling    Tongue swelling/numbness   Toprol Xl [Metoprolol] Shortness Of  Breath, Other (See Comments) and Cough    Myalgias Wheezing Asthenia    Asa [Aspirin] Nausea And Vomiting and Other (See Comments)    Told to avoid due to liver issues   Lactose Intolerance (Gi) Diarrhea and Nausea And Vomiting     Medication list after today's encounter   Current Outpatient Medications:    acetaminophen (TYLENOL) 650 MG CR tablet, Take 1 tablet (650 mg total) by mouth every 8 (eight) hours as needed for pain., Disp: , Rfl:    amLODipine (NORVASC) 10 MG tablet, Take 1 tablet (10 mg total) by mouth at bedtime., Disp: 90 tablet, Rfl: 0   atorvastatin (LIPITOR) 80 MG tablet, TAKE 1 TABLET(80 MG) BY MOUTH DAILY (Patient taking differently: Take 80 mg by mouth in the morning.), Disp: 90 tablet, Rfl: 1   bisacodyl (DULCOLAX) 5 MG EC tablet, Take 5 mg by mouth daily as needed for moderate constipation., Disp: , Rfl:    docusate sodium (COLACE) 100 MG capsule, Take 400 mg by mouth in the morning., Disp: , Rfl:    insulin glargine (LANTUS SOLOSTAR) 100 UNIT/ML Solostar Pen, ADMINISTER 25 UNITS UNDER THE SKIN TWICE DAILY (Patient taking differently: Inject 20-26 Units into the skin See admin instructions. Inject 26 units every morning, 20 units at bedtime), Disp: 15 mL, Rfl: 2   pantoprazole (PROTONIX) 40 MG tablet, TAKE 1 TABLET(40 MG) BY MOUTH DAILY (Patient taking differently: Take 40 mg by mouth in the morning.), Disp: 90 tablet, Rfl: 0   amiodarone  (PACERONE) 200 MG tablet, Take 1 tablet (200 mg total) by mouth daily. (Patient not taking: Reported on 12/30/2021), Disp: 30 tablet, Rfl: 0   apixaban (ELIQUIS) 5 MG TABS tablet, Take 1 tablet (5 mg total) by mouth 2 (two) times daily. (Patient not taking: Reported on 12/30/2021), Disp: 60 tablet, Rfl: 0   metoprolol succinate (TOPROL-XL) 50 MG 24 hr tablet, Take 1 tablet (50 mg total) by mouth daily. Take with or immediately following a meal. (Patient not taking: Reported on 12/30/2021), Disp: 30 tablet, Rfl: 0  Laboratory examination:   Lab Results  Component Value Date   NA 139 12/20/2021   K 4.1 12/20/2021   CO2 22 12/20/2021   GLUCOSE 182 (H) 12/20/2021   BUN 16 12/20/2021   CREATININE 0.98 12/20/2021   CALCIUM 9.0 12/20/2021   EGFR 59 (L) 05/11/2021   GFRNONAA >60 12/20/2021       Latest Ref Rng & Units 12/20/2021   12:14 AM 12/19/2021    3:21 AM 12/18/2021   10:25 PM  CMP  Glucose 70 - 99 mg/dL 182  279  382   BUN 8 - 23 mg/dL 16  20  33   Creatinine 0.44 - 1.00 mg/dL 0.98  1.04  1.10   Sodium 135 - 145 mmol/L 139  139  137   Potassium 3.5 - 5.1 mmol/L 4.1  4.3  5.6   Chloride 98 - 111 mmol/L 107  106  102   CO2 22 - 32 mmol/L 22  23    Calcium 8.9 - 10.3 mg/dL 9.0  9.2        Latest Ref Rng & Units 12/18/2021   10:25 PM 12/18/2021   10:15 PM 03/12/2021   11:00 AM  CBC  WBC 4.0 - 10.5 K/uL  8.6    Hemoglobin 12.0 - 15.0 g/dL 13.6  12.6  12.1   Hematocrit 36.0 - 46.0 % 40.0  39.7  39.5   Platelets 150 - 400 K/uL  188      Lipid Panel Recent Labs    05/11/21 1635  CHOL 147  TRIG 125  LDLCALC 84  HDL 41  CHOLHDL 3.6    HEMOGLOBIN A1C Lab Results  Component Value Date   HGBA1C 10.2 (H) 12/19/2021   MPG 246 12/19/2021   TSH No results for input(s): "TSH" in the last 8760 hours.  External labs:     Radiology:    Cardiac Studies:   PCV ECHOCARDIOGRAM COMPLETE 10/15/2020 Echocardiogram 09/30/2020: 1. Normal LV systolic function with visual EF  55-60%. Left ventricle cavity is normal in size. Moderate left ventricular hypertrophy. Normal global wall motion.Doppler evidence of grade III (restrictive) diastolic dysfunction, elevated LAP. 2. Left atrial cavity is mildly dilated. 3. Mild (Grade I) mitral regurgitation. 4. Mild tricuspid regurgitation. No evidence of pulmonary hypertension. RVSP measures 33 mmHg. 5. Mild pulmonic regurgitation. 6. Small pericardial effusion, located posteriorly, without hemodynamic significance. 7. IVC is normal with a respiratory response of <50%. 8. Compared to study 05/12/2020 LVEF improved from <20% to 55-60%, RV function has improved, pulmonary hypertension has resolved prior RVSP 40.35mHg and now 367mg, pericardial effusion was large and now small in size, Moderate / severe TR is now mild. Otherwise, no significant change.    ECHO COMPLETE WITH IMAGING ENHANCING AGENT 02/04/2020 1. Since the last study on 07/27/2019 LVEF has decreased, now severely impaired at 25-30% and diffuse hypokinesis. Severe concentric LVH. RVEF is at least moderately decreased. Evaluation for infiltrative cardiomyopathies such as cardiac amyloidosis should be considered. 2. Left ventricular ejection fraction, by estimation, is 25 to 30%. The left ventricle has severely decreased function. The left ventricle has no regional wall motion abnormalities. There is severe concentric left ventricular hypertrophy. Left ventricular diastolic function could not be evaluated. 3. Right ventricular systolic function is moderately reduced. The right ventricular size is moderately enlarged. There is moderately elevated pulmonary artery systolic pressure. The estimated right ventricular systolic pressure is 5050.3mHg. 4. Left atrial size was mildly dilated. 5. The mitral valve is normal in structure. Mild mitral valve regurgitation. No evidence of mitral stenosis. 6. Tricuspid valve regurgitation is moderate to severe. 7. The aortic valve is  normal in structure. Aortic valve regurgitation is not visualized. Mild to moderate aortic valve sclerosis/calcification is present, without any evidence of aortic stenosis. 8. The inferior vena cava is normal in size with greater than 50% respiratory variability, suggesting right atrial pressure of 3 mmHg.     EKG:   12/30/2021: Sinus Rhythm, LVH. ST-T wave changes 2/2 hypertrophy.   Assessment     ICD-10-CM   1. Chronic heart failure with preserved ejection fraction (HFpEF) (HCC)  I50.32 EKG 12-Lead    2. Essential hypertension  I10     3. Paroxysmal A-fib ([place] I48.0        Orders Placed This Encounter  Procedures   EKG 12-Lead    No orders of the defined types were placed in this encounter.   There are no discontinued medications.   Recommendations:   [name] Foldens a [age].o.  female who is here for hospital follow-up   Chronic heart failure with preserved ejection fraction (HFpEF)  Continue Toprol 5066maily Patient not requiring diuretic   Essential hypertension Continue current cardiac medications. Encourage low-sodium diet, less than 2000 mg daily. BP very high today however in hospital and at home she is very well controlled Patient states she is always very high at the doctors office We will enroll her  in RPM program Follow-up with our pharmacist in 1 week   Paroxysmal A-fib  Continue Amio and Eliquis Patient in NSR this visit      Floydene Flock, DO, [facility]  12/31/2021, 8:52 AM Office: [phone] Pager: [phone]

## 2022-01-06 ENCOUNTER — Ambulatory Visit (INDEPENDENT_AMBULATORY_CARE_PROVIDER_SITE_OTHER): Payer: Medicare Other | Admitting: Family Medicine

## 2022-01-06 ENCOUNTER — Encounter: Payer: Self-pay | Admitting: Family Medicine

## 2022-01-06 VITALS — BP 132/84 | HR 85 | Temp 97.9°F | Ht 63.0 in | Wt 200.0 lb

## 2022-01-06 DIAGNOSIS — I4891 Unspecified atrial fibrillation: Secondary | ICD-10-CM | POA: Diagnosis not present

## 2022-01-06 DIAGNOSIS — Z0000 Encounter for general adult medical examination without abnormal findings: Secondary | ICD-10-CM

## 2022-01-06 DIAGNOSIS — E785 Hyperlipidemia, unspecified: Secondary | ICD-10-CM

## 2022-01-06 DIAGNOSIS — K219 Gastro-esophageal reflux disease without esophagitis: Secondary | ICD-10-CM

## 2022-01-06 DIAGNOSIS — E1165 Type 2 diabetes mellitus with hyperglycemia: Secondary | ICD-10-CM

## 2022-01-06 DIAGNOSIS — Z1211 Encounter for screening for malignant neoplasm of colon: Secondary | ICD-10-CM

## 2022-01-06 DIAGNOSIS — I10 Essential (primary) hypertension: Secondary | ICD-10-CM

## 2022-01-06 DIAGNOSIS — I5042 Chronic combined systolic (congestive) and diastolic (congestive) heart failure: Secondary | ICD-10-CM

## 2022-01-06 DIAGNOSIS — E1159 Type 2 diabetes mellitus with other circulatory complications: Secondary | ICD-10-CM | POA: Diagnosis not present

## 2022-01-06 DIAGNOSIS — E1169 Type 2 diabetes mellitus with other specified complication: Secondary | ICD-10-CM

## 2022-01-06 DIAGNOSIS — I152 Hypertension secondary to endocrine disorders: Secondary | ICD-10-CM

## 2022-01-06 MED ORDER — AMLODIPINE BESYLATE 10 MG PO TABS: 10.0000 mg | ORAL_TABLET | Freq: Every day | ORAL | 1 refills | Status: DC

## 2022-01-06 MED ORDER — BLOOD GLUCOSE METER KIT: PACK | 0 refills | Status: AC

## 2022-01-06 MED ORDER — RYBELSUS 3 MG PO TABS: 3.0000 mg | ORAL_TABLET | Freq: Every day | ORAL | 0 refills | Status: DC

## 2022-01-06 MED ORDER — PANTOPRAZOLE SODIUM 40 MG PO TBEC: DELAYED_RELEASE_TABLET | ORAL | 2 refills | Status: DC

## 2022-01-06 MED ORDER — ATORVASTATIN CALCIUM 80 MG PO TABS: ORAL_TABLET | ORAL | 2 refills | Status: DC

## 2022-01-06 MED ORDER — LANTUS SOLOSTAR 100 UNIT/ML ~~LOC~~ SOPN: PEN_INJECTOR | SUBCUTANEOUS | 2 refills | Status: DC

## 2022-01-06 NOTE — Patient Instructions (Addendum)
It was a pleasure meeting you today. Thank you for allowing me to take part in your health care.  Our goals for today as we discussed include:  Your Cardiologist had recommended to continue Eliquis, Amiodarone and Metoprolol Please call her office for refills  Refill sent for Norvasc, Protonix, Lipitor  Continue Insulin 27 units in am and 20 units in evening Start Rybelsus 3 mg daily Start Libre 2 sensor to monitor sugars  Referrals sent for Colonoscopy, Eye exam and Foot exam   If you have any questions or concerns, please do not hesitate to call the office at [phone].  I look forward to our next visit and until then take care and stay safe.  Regards,   Dana Allan, MD   [facility]

## 2022-01-07 ENCOUNTER — Telehealth: Payer: Self-pay | Admitting: Family Medicine

## 2022-01-07 ENCOUNTER — Other Ambulatory Visit (HOSPITAL_COMMUNITY): Payer: Self-pay

## 2022-01-07 ENCOUNTER — Telehealth: Payer: Self-pay

## 2022-01-07 DIAGNOSIS — I4891 Unspecified atrial fibrillation: Secondary | ICD-10-CM

## 2022-01-07 MED ORDER — AMIODARONE HCL 200 MG PO TABS: 200.0000 mg | ORAL_TABLET | Freq: Every day | ORAL | 3 refills | Status: DC

## 2022-01-07 MED ORDER — METOPROLOL SUCCINATE ER 50 MG PO TB24: 50.0000 mg | ORAL_TABLET | Freq: Every day | ORAL | 0 refills | Status: DC

## 2022-01-07 MED ORDER — APIXABAN 5 MG PO TABS: 5.0000 mg | ORAL_TABLET | Freq: Two times a day (BID) | ORAL | 0 refills | Status: DC

## 2022-01-07 NOTE — Telephone Encounter (Signed)
Patient Advocate Encounter   Received notification from [facility] Medicaid that prior authorization for Rybelsus 3MG  is required.   PA submitted on 01/07/2022 PA # 01/09/2022 W Submitted on [place] [employer] Status is pending

## 2022-01-07 NOTE — Telephone Encounter (Signed)
Spoke to Patient to let her know that Dr. Clent Ridges called in the refills she was requesting but the next refills will need to be called in by the Cardiologist. Patient verbalized understanding and is agreeable.

## 2022-01-07 NOTE — Telephone Encounter (Signed)
Please call patient to let her know that I have refilled this as well as her Amiodarone 200 mg daily and Eliquis 5 mg two times a day.  I spoke with her Cardiologist who recommended that she be taking these medications.  She will need to call them for her next refills. If she has any questions please have her call her cardiologist.

## 2022-01-07 NOTE — Telephone Encounter (Signed)
Pharmacy Patient Advocate Encounter  Prior Authorization for Rybelsus 3mg  has been approved.    PA# W

## 2022-01-08 ENCOUNTER — Other Ambulatory Visit: Payer: Self-pay

## 2022-01-08 ENCOUNTER — Encounter: Payer: Self-pay | Admitting: Family Medicine

## 2022-01-08 DIAGNOSIS — E1165 Type 2 diabetes mellitus with hyperglycemia: Secondary | ICD-10-CM

## 2022-01-08 MED ORDER — PEN NEEDLES 32G X 6 MM MISC: 0 refills | Status: DC

## 2022-01-08 MED ORDER — LANTUS SOLOSTAR 100 UNIT/ML ~~LOC~~ SOPN: PEN_INJECTOR | SUBCUTANEOUS | 2 refills | Status: DC

## 2022-01-08 NOTE — Telephone Encounter (Signed)
Lantus refill and pen needles prescriptions sent to [facility]

## 2022-01-08 NOTE — Telephone Encounter (Signed)
Pt need refill on lantus pen and needles sent to [facility] on [address]

## 2022-01-18 ENCOUNTER — Ambulatory Visit (INDEPENDENT_AMBULATORY_CARE_PROVIDER_SITE_OTHER): Payer: Medicare Other | Admitting: Podiatry

## 2022-01-18 ENCOUNTER — Telehealth: Payer: Self-pay | Admitting: Family Medicine

## 2022-01-18 ENCOUNTER — Encounter: Payer: Self-pay | Admitting: Podiatry

## 2022-01-18 DIAGNOSIS — M79674 Pain in right toe(s): Secondary | ICD-10-CM

## 2022-01-18 DIAGNOSIS — Z7901 Long term (current) use of anticoagulants: Secondary | ICD-10-CM

## 2022-01-18 DIAGNOSIS — M79675 Pain in left toe(s): Secondary | ICD-10-CM

## 2022-01-18 DIAGNOSIS — B351 Tinea unguium: Secondary | ICD-10-CM | POA: Diagnosis not present

## 2022-01-18 DIAGNOSIS — E1165 Type 2 diabetes mellitus with hyperglycemia: Secondary | ICD-10-CM

## 2022-01-18 MED ORDER — FREESTYLE LIBRE 2 SENSOR MISC: 5 refills | Status: DC

## 2022-01-18 NOTE — Telephone Encounter (Signed)
Prescription Request  01/18/2022  Is this a "Controlled Substance" medicine? No  LOV: 01/06/2022  What is the name of the medication or equipment? Libre 2 The sensor will run out in 2 days and needs a prescription.  Have you contacted your pharmacy to request a refill? No   Which pharmacy would you like this sent to?  [id number] - [address], [address] [place] [address] Phone: [phone] Fax: [phone]    Patient notified that their request is being sent to the clinical staff for review and that they should receive a response within 2 business days.   Please advise at Mobile [phone] (mobile)

## 2022-01-18 NOTE — Progress Notes (Signed)
This patient presents to the office with chief complaint of long thick nails and diabetic feet.  This patient  says there  is  no pain and discomfort in their feet.  This patient says there are long thick painful big toenails   These nails are painful walking and wearing shoes.  Patient has no history of infection or drainage from both feet.  Patient is unable to  self treat his own nails . This patient presents  to the office today for treatment of the  long nails and a foot evaluation due to history of  diabetes.  General Appearance  Alert, conversant and in no acute stress.  Vascular  Dorsalis pedis and posterior tibial  pulses are palpable  bilaterally.  Capillary return is within normal limits  bilaterally. Temperature is within normal limits  bilaterally.  Neurologic  Senn-Weinstein monofilament wire test within normal limits  bilaterally. Muscle power within normal limits bilaterally.  Nails Thick disfigured discolored nails with subungual debris  hallux nails bilaterally. No evidence of bacterial infection or drainage bilaterally.  Orthopedic  No limitations of motion of motion feet .  No crepitus or effusions noted.  Mild  HAV  B/L.  Skin  normotropic skin with no porokeratosis noted bilaterally.  No signs of infections or ulcers noted.     Onychomycosis  Diabetes with no foot complications  IE  Debride nails x 10.  A diabetic foot exam was performed and there is no evidence of any vascular or neurologic pathology.   RTC 14 weeks    Gardiner Barefoot DPM

## 2022-01-18 NOTE — Telephone Encounter (Signed)
Pt called stating she need a prescription for libre sensor because her insurance will pay for it now

## 2022-01-18 NOTE — Telephone Encounter (Signed)
Prescription sent to Pharmacy.  

## 2022-01-20 ENCOUNTER — Encounter: Payer: Self-pay | Admitting: Family Medicine

## 2022-01-20 DIAGNOSIS — Z1211 Encounter for screening for malignant neoplasm of colon: Secondary | ICD-10-CM | POA: Insufficient documentation

## 2022-01-20 MED ORDER — AMIODARONE HCL 200 MG PO TABS: 200.0000 mg | ORAL_TABLET | Freq: Every day | ORAL | 0 refills | Status: DC

## 2022-01-20 MED ORDER — APIXABAN 5 MG PO TABS: 5.0000 mg | ORAL_TABLET | Freq: Two times a day (BID) | ORAL | 0 refills | Status: DC

## 2022-01-20 MED ORDER — METOPROLOL SUCCINATE ER 50 MG PO TB24: 50.0000 mg | ORAL_TABLET | Freq: Every day | ORAL | 0 refills | Status: DC

## 2022-01-20 NOTE — Progress Notes (Addendum)
SUBJECTIVE:   Chief Complaint  Patient presents with   Establish Care   HPI Patient presents to clinic to establish care.    No acute concerns.  Diabetes type 2 Asymptomatic.  Not been able to check blood glucose at home.  Was previously using libre sensor but has not continued.  She reports compliancy with her medication.  Takes insulin glargine 27 units in a.m. and 20 units at night.  She was previously having some hypoglycemic events in the early hours of the morning.  She had previously declined other medications as she reports her body is sensitive to medications.  She is willing to try oral semaglutide.  To help lower her glucose levels.  Recent A1c 10.2.  She denies any visual changes, headaches, chest pain, shortness of breath, abdominal pain, nausea/vomiting or lower extremity edema.  HFrEF She has recently switched to a new cardiologist.  Currently asymptomatic.  Denies any symptoms of volume overload.  Not currently on diuretics or GDMT.    A-fib Does not appear to be in A-fib currently.  Asymptomatic.  Recently hospitalized for A-fib RVR and restarted on Eliquis now at 5 mg twice daily.  She was also started on amiodarone as well as metoprolol.  Patient reports that she has not taken amiodarone or metoprolol since being discharged from hospital 4/10.  Hypertension Asymptomatic.  Currently taking amlodipine 10 mg daily and metoprolol 25 mg daily.  Previously taking BiDil 3 times daily, unclear why medication was discontinued.    PERTINENT PMH / PSH: Diabetes type 2 Hypertension HFrEF Grade 3 diastolic dysfunction A-fib on Eliquis CVA status post tPA PEA arrest status post mechanical thrombectomy  OBJECTIVE:  BP 132/84   Pulse 85   Temp 97.9 F (36.6 C)   Ht 5\' 3"  (1.6 m)   Wt 200 lb (90.7 kg)   SpO2 96%   BMI 35.43 kg/m    Physical Exam Vitals reviewed.  Constitutional:      General: She is not in acute distress.    Appearance: She is not ill-appearing.   HENT:     Head: Normocephalic.     Right Ear: Tympanic membrane, ear canal and external ear normal.     Left Ear: Tympanic membrane, ear canal and external ear normal.     Nose: Nose normal.     Mouth/Throat:     Mouth: Mucous membranes are moist.  Eyes:     Extraocular Movements: Extraocular movements intact.     Conjunctiva/sclera: Conjunctivae normal.     Pupils: Pupils are equal, round, and reactive to light.  Neck:     Vascular: No carotid bruit.  Cardiovascular:     Rate and Rhythm: Normal rate and regular rhythm.     Pulses: Normal pulses.     Heart sounds: Normal heart sounds.  Pulmonary:     Effort: Pulmonary effort is normal.     Breath sounds: Normal breath sounds.  Abdominal:     General: Bowel sounds are normal. There is no distension.     Palpations: Abdomen is soft.     Tenderness: There is no abdominal tenderness. There is no right CVA tenderness, left CVA tenderness, guarding or rebound.  Musculoskeletal:        General: Normal range of motion.     Cervical back: Normal range of motion.     Right lower leg: No edema.     Left lower leg: No edema.  Lymphadenopathy:     Cervical: No cervical adenopathy.  Skin:    Capillary Refill: Capillary refill takes less than 2 seconds.  Neurological:     General: No focal deficit present.     Mental Status: She is alert and oriented to person, place, and time. Mental status is at baseline.     Motor: No weakness.  Psychiatric:        Mood and Affect: Mood normal.        Behavior: Behavior normal.        Thought Content: Thought content normal.        Judgment: Judgment normal.     ASSESSMENT/PLAN:  Type 2 diabetes mellitus with hyperglycemia, unspecified whether long term insulin use (HCC) Assessment & Plan: Last A1c 10.2 on 12/9.  Currently taking Lantus 27 units a.m. and 20 units p.m for previous history of hypoglycemic events in early morning.   -Refill Lantus 27 units a.m. and 27 units p.m. -Start Rybelsus 3  mg daily x 4 weeks -Follow-up in 4 weeks -Strict return precautions provided.  Orders: -     Ambulatory referral to Ophthalmology -     Ambulatory referral to Podiatry  Hypertension associated with diabetes [facility]) Assessment & Plan: Chronic.  Stable.  Would like blood pressure less than 140/90 per JNC 8 guidelines.  Previously attempted multiple times starting ACE or ARB.  Patient declines starting new medications due to multiple sensitivities.   Continue to monitor blood pressure Refill amlodipine 10 mg nightly  Refill metoprolol 50 mg daily Follow-up with cardiology    Atrial fibrillation with RVR (HCC) Assessment & Plan: Chronic.  Stable.  Appears to be in sinus rhythm.  Compliant with Eliquis.  No signs of bleeding. Continue Eliquis 5 mg twice daily Continue amiodarone 200 mg daily Continue metoprolol XL 50 mg daily I reached out to Dr. Melton Alar to confirm above medications and dosing Refill x 1 month for above medications Dr. Melton Alar will continue further refill of medications. Patient to follow with cardiology    Chronic combined systolic and diastolic CHF (congestive heart failure) (HCC) Assessment & Plan: Chronic.  Stable. Appears euvolemic on exam. Continue metoprolol XL 50 mg daily GLP-1 initiated Consider SGLT 2 however patient declined as did not tolerate Jardiance in the past. Continue follow-up with cardiology     Gastroesophageal reflux disease without esophagitis Assessment & Plan: Chronic.  Stable. Refill Protonix.   Hyperlipidemia associated with type 2 diabetes mellitus (HCC)  Colon cancer screening -     Ambulatory referral to Gastroenterology  Healthcare maintenance Assessment & Plan: Previously declined HCM.  Now willing to initiate primary screening. Refer for colonoscopy Refer to opthalmology for diabetic eye exam Refer to podiatry for diabetic foot exam  Declined flu vaccine Pneumonia vaccine up-to-date Shingles vaccine  up-to-date     Other orders -     blood glucose meter kit and supplies; Dispense based on patient and insurance preference. Use up to four times daily as directed. (FOR ICD-10 E10.9, E11.9).  Dispense: 1 each; Refill: 0   PDMP reviewed  Return in about 4 weeks (around 02/03/2022) for PCP.  Dana Allan, MD

## 2022-01-20 NOTE — Assessment & Plan Note (Signed)
Last A1c 10.2 on 12/9.  Currently taking Lantus 27 units a.m. and 20 units p.m for previous history of hypoglycemic events in early morning.   -Refill Lantus 27 units a.m. and 27 units p.m. -Start Rybelsus 3 mg daily x 4 weeks -Follow-up in 4 weeks -Strict return precautions provided.

## 2022-01-20 NOTE — Assessment & Plan Note (Addendum)
Previously declined HCM.  Now willing to initiate primary screening. Refer for colonoscopy Refer to opthalmology for diabetic eye exam Refer to podiatry for diabetic foot exam  Declined flu vaccine Pneumonia vaccine up-to-date Shingles vaccine up-to-date

## 2022-01-20 NOTE — Assessment & Plan Note (Signed)
Chronic.  Stable.  Appears to be in sinus rhythm.  Compliant with Eliquis.  No signs of bleeding. Continue Eliquis 5 mg twice daily Continue amiodarone 200 mg daily Continue metoprolol XL 50 mg daily I reached out to Dr. Shellia Carwin to confirm above medications and dosing Refill x 1 month for above medications Dr. Shellia Carwin will continue further refill of medications. Patient to follow with cardiology

## 2022-01-20 NOTE — Addendum Note (Signed)
Addended by: Lanice Shirts on: 01/20/2022 08:05 AM   Modules accepted: Level of Service

## 2022-01-20 NOTE — Telephone Encounter (Signed)
Pt called in staying that her pharmacy([facility]) still need authorization from Dr. Volanda Napoleon in order to release the med to pt.

## 2022-01-20 NOTE — Assessment & Plan Note (Addendum)
Chronic.  Stable.  Would like blood pressure less than 140/90 per JNC 8 guidelines.  Previously attempted multiple times starting ACE or ARB.  Patient declines starting new medications due to multiple sensitivities.   Continue to monitor blood pressure Refill amlodipine 10 mg nightly  Refill metoprolol 50 mg daily Follow-up with cardiology

## 2022-01-20 NOTE — Assessment & Plan Note (Signed)
Chronic.  Stable. Appears euvolemic on exam. Continue metoprolol XL 50 mg daily GLP-1 initiated Consider SGLT 2 however patient declined as did not tolerate Jardiance in the past. Continue follow-up with cardiology

## 2022-01-20 NOTE — Assessment & Plan Note (Signed)
Chronic.  Stable. Refill Protonix.

## 2022-01-21 NOTE — Telephone Encounter (Signed)
Pt called in again today staying that she needs prior authorization from provider for her sensor. She getting a little bit frustrated, and that if this not going to get done to let her know so she can change provider.

## 2022-01-25 ENCOUNTER — Telehealth: Payer: Self-pay

## 2022-01-25 ENCOUNTER — Other Ambulatory Visit (HOSPITAL_COMMUNITY): Payer: Self-pay

## 2022-01-25 NOTE — Telephone Encounter (Signed)
Spoke with Patient and [facility] to ensure that she receives her Freestyle Philip 2 sensor today because [facility] was telling her that Dr. Volanda Napoleon had to approve the PA.

## 2022-01-25 NOTE — Telephone Encounter (Signed)
Patient called stating [facility] stated that Dr. Volanda Napoleon needed to call them for a PA approval when it had already been approved so I called [facility] to let them know that we received the Approval this morning so now the Pharmacy is getting the [facility] 2 sensor ready for the Patient. Patient was very concerned and stated that she needed to check her sugar. I called the Patient back and let her know that the Pharmacy stated they were getting it ready now.

## 2022-01-25 NOTE — Telephone Encounter (Signed)
Was patient notified?

## 2022-01-25 NOTE — Telephone Encounter (Signed)
Patient Advocate Encounter  Prior Authorization for [employer] 2 sensors has been approved.    PA# [id number] Effective dates: 01/25/22 through 07/24/22

## 2022-01-25 NOTE — Telephone Encounter (Signed)
Noted  

## 2022-02-05 ENCOUNTER — Other Ambulatory Visit: Payer: Self-pay

## 2022-02-05 ENCOUNTER — Emergency Department (HOSPITAL_COMMUNITY)
Admission: EM | Admit: 2022-02-05 | Discharge: 2022-02-06 | Disposition: A | Discharge status: Home / Self Care | Payer: Medicare Other | Attending: Emergency Medicine | Admitting: Emergency Medicine

## 2022-02-05 ENCOUNTER — Emergency Department (HOSPITAL_COMMUNITY): Payer: Medicare Other

## 2022-02-05 ENCOUNTER — Encounter (HOSPITAL_COMMUNITY): Payer: Self-pay | Admitting: *Deleted

## 2022-02-05 DIAGNOSIS — R0602 Shortness of breath: Secondary | ICD-10-CM | POA: Diagnosis present

## 2022-02-05 DIAGNOSIS — E1165 Type 2 diabetes mellitus with hyperglycemia: Secondary | ICD-10-CM | POA: Diagnosis not present

## 2022-02-05 DIAGNOSIS — Z794 Long term (current) use of insulin: Secondary | ICD-10-CM | POA: Insufficient documentation

## 2022-02-05 DIAGNOSIS — I4891 Unspecified atrial fibrillation: Secondary | ICD-10-CM | POA: Insufficient documentation

## 2022-02-05 DIAGNOSIS — Z91010 Allergy to peanuts: Secondary | ICD-10-CM | POA: Insufficient documentation

## 2022-02-05 DIAGNOSIS — Z7901 Long term (current) use of anticoagulants: Secondary | ICD-10-CM | POA: Insufficient documentation

## 2022-02-05 DIAGNOSIS — Z79899 Other long term (current) drug therapy: Secondary | ICD-10-CM | POA: Diagnosis not present

## 2022-02-05 DIAGNOSIS — M549 Dorsalgia, unspecified: Secondary | ICD-10-CM | POA: Insufficient documentation

## 2022-02-05 LAB — TSH: TSH: 2.727 u[IU]/mL (ref 0.350–4.500)

## 2022-02-05 LAB — BASIC METABOLIC PANEL
Anion gap: 9 (ref 5–15)
BUN: 23 mg/dL (ref 8–23)
CO2: 22 mmol/L (ref 22–32)
Calcium: 8.9 mg/dL (ref 8.9–10.3)
Chloride: 104 mmol/L (ref 98–111)
Creatinine, Ser: 1.04 mg/dL — ABNORMAL HIGH (ref 0.44–1.00)
GFR, Estimated: 58 mL/min — ABNORMAL LOW (ref >60)
Glucose, Bld: 254 mg/dL — ABNORMAL HIGH (ref 70–99)
Potassium: 3.7 mmol/L (ref 3.5–5.1)
Sodium: 135 mmol/L (ref 135–145)

## 2022-02-05 LAB — CBC
HCT: 42.8 % (ref 36.0–46.0)
Hemoglobin: 12.9 g/dL (ref 12.0–15.0)
MCH: 25 pg — ABNORMAL LOW (ref 26.0–34.0)
MCHC: 30.1 g/dL (ref 30.0–36.0)
MCV: 83.1 fL (ref 80.0–100.0)
Platelets: 214 10*3/uL (ref 150–400)
RBC: 5.15 MIL/uL — ABNORMAL HIGH (ref 3.87–5.11)
RDW: 14.5 % (ref 11.5–15.5)
WBC: 10.2 10*3/uL (ref 4.0–10.5)
nRBC: 0 % (ref 0.0–0.2)

## 2022-02-05 LAB — MAGNESIUM: Magnesium: 1.5 mg/dL — ABNORMAL LOW (ref 1.7–2.4)

## 2022-02-05 MED ORDER — ETOMIDATE 2 MG/ML IV SOLN
INTRAVENOUS | Status: AC | PRN
  Administered 2022-02-05: 10 mg via INTRAVENOUS

## 2022-02-05 MED ORDER — DILTIAZEM HCL-DEXTROSE 125-5 MG/125ML-% IV SOLN (PREMIX)
5.0000 mg/h | INTRAVENOUS | Status: DC
  Filled 2022-02-05: qty 125

## 2022-02-05 MED ORDER — DILTIAZEM LOAD VIA INFUSION
20.0000 mg | Freq: Once | INTRAVENOUS | Status: DC
  Filled 2022-02-05: qty 20

## 2022-02-05 MED ORDER — ETOMIDATE 2 MG/ML IV SOLN
10.0000 mg | Freq: Once | INTRAVENOUS | Status: AC
  Administered 2022-02-05: 10 mg via INTRAVENOUS
  Filled 2022-02-05: qty 10

## 2022-02-05 MED ORDER — MIDAZOLAM HCL 2 MG/2ML IJ SOLN
0.5000 mg | Freq: Once | INTRAMUSCULAR | Status: DC
  Filled 2022-02-05: qty 2

## 2022-02-05 NOTE — ED Notes (Signed)
Etomidate 10mg

## 2022-02-05 NOTE — ED Triage Notes (Signed)
The pt arrived by gems from home back pain today and diaphoresis when ems arrived  they found the pt to be in af  they gave her 10mg  od  cardizen that stppped the af but the af returnee minutes later  at present her back pain is better.  Iv lt a-c by ems a and o x 4

## 2022-02-06 DIAGNOSIS — I4891 Unspecified atrial fibrillation: Secondary | ICD-10-CM | POA: Diagnosis not present

## 2022-02-06 MED ORDER — MAGNESIUM SULFATE 2 GM/50ML IV SOLN
2.0000 g | Freq: Once | INTRAVENOUS | Status: AC
  Administered 2022-02-06: 2 g via INTRAVENOUS
  Filled 2022-02-06: qty 50

## 2022-02-06 NOTE — ED Notes (Signed)
The pt is in  nsr with an occassional pvc

## 2022-02-06 NOTE — ED Provider Notes (Signed)
I assumed care of this patient.  Please see previous provider note for further details of Hx, PE.  Briefly patient is a 72 y.o. female who presented A-fib RVR compliant with her anticoagulation who was cardioverted by Dr. Delila Spence.  Noted to have mild hypomagnesemia and currently getting IV repletion.  Plan is to monitor patient and ensure she does not go back into A-fib.  If she remains stable patient will be discharged home..    1:56 AM Patient remained hemodynamically stable without recurrence of her A-fib.  The patient appears reasonably screened and/or stabilized for discharge and I doubt any other medical condition or other [facility] requiring further screening, evaluation, or treatment in the ED at this time. I have discussed the findings, Dx and Tx plan with the patient/family who expressed understanding and agree(s) with the plan. Discharge instructions discussed at length. The patient/family was given strict return precautions who verbalized understanding of the instructions. No further questions at time of discharge.  Disposition: Discharge  Condition: Good  ED Discharge Orders     None        Follow Up: Floydene Flock, [place] [id number] [phone]  Schedule an appointment as soon as possible for a visit        Kyarah Enamorado, Grayce Sessions, MD 02/06/22 0157

## 2022-02-06 NOTE — ED Notes (Signed)
The pt reports that she is comfortable no pain  nsr on the monintor

## 2022-02-06 NOTE — ED Provider Notes (Incomplete)
[facility] Provider Note   CSN: [id number] Arrival date & time: 02/05/22  2232     History {Add pertinent medical, surgical, social history, OB history to HPI:1} Chief Complaint  Patient presents with  . Back Pain    [name] [name] is a 72 y.o. female.  Pt is a 72 yo female with pmhx significant for afib on Eliquis, hld, dm, and gerd.  Pt said she felt sob and had some back pain and felt her heart racing.  She called her daughter who told her to call EMS.  EMS found pt to be in afib with rvr.  They gave her 10 mg IV and HR went down, but it came back up.  Pt denies f/c.  She has been compliant with her eliquis.       Home Medications Prior to Admission medications   Medication Sig Start Date End Date Taking? Authorizing Provider  acetaminophen (TYLENOL) 650 MG CR tablet Take 1 tablet (650 mg total) by mouth every 8 (eight) hours as needed for pain. 12/20/21   Alcus Dad, MD  amiodarone (PACERONE) 200 MG tablet Take 1 tablet (200 mg total) by mouth daily. 01/06/22 02/06/22  Carollee Leitz, MD  amLODipine (NORVASC) 10 MG tablet Take 1 tablet (10 mg total) by mouth at bedtime. 01/06/22   Carollee Leitz, MD  apixaban (ELIQUIS) 5 MG TABS tablet Take 1 tablet (5 mg total) by mouth 2 (two) times daily. 01/06/22 02/06/22  Carollee Leitz, MD  atorvastatin (LIPITOR) 80 MG tablet TAKE 1 TABLET(80 MG) BY MOUTH DAILY 01/06/22   Carollee Leitz, MD  blood glucose meter kit and supplies Dispense based on patient and insurance preference. Use up to four times daily as directed. (FOR ICD-10 E10.9, E11.9). 01/06/22   Carollee Leitz, MD  Continuous Blood Gluc Sensor (FREESTYLE LIBRE 2 SENSOR) MISC Apply 1 to skin every 14 days 01/18/22   Carollee Leitz, MD  insulin glargine (LANTUS SOLOSTAR) 100 UNIT/ML Solostar Pen ADMINISTER 27 UNITS UNDER THE SKIN in am and 20 units at night 01/08/22   Carollee Leitz, MD  Insulin Pen Needle (PEN NEEDLES) 32G X 6 MM MISC 32G  x 6 MM pen needles 01/08/22   Carollee Leitz, MD  metoprolol succinate (TOPROL XL) 50 MG 24 hr tablet Take 1 tablet (50 mg total) by mouth daily. Take with or immediately following a meal. 01/06/22 02/06/22  Carollee Leitz, MD  pantoprazole (PROTONIX) 40 MG tablet TAKE 1 TABLET(40 MG) BY MOUTH DAILY 01/06/22   Carollee Leitz, MD  Semaglutide (RYBELSUS) 3 MG TABS Take 3 mg by mouth daily. 01/06/22   Carollee Leitz, MD      Allergies    Food, Pacerone [amiodarone], Peanut (diagnostic), Toprol xl [metoprolol], Asa [aspirin], and Lactose intolerance (gi)    Review of Systems   Review of Systems  Cardiovascular:  Positive for palpitations.    Physical Exam Updated Vital Signs BP (!) 150/85   Pulse 76   Resp 20   Ht 5\' 3"  (1.6 m)   Wt 90.7 kg   SpO2 100%   BMI 35.42 kg/m  Physical Exam Vitals and nursing note reviewed.  Constitutional:      Appearance: Normal appearance. She is obese.  HENT:     Head: Normocephalic and atraumatic.     Right Ear: External ear normal.     Left Ear: External ear normal.     Nose: Nose normal.     Mouth/Throat:  Mouth: Mucous membranes are moist.     Pharynx: Oropharynx is clear.  Eyes:     Extraocular Movements: Extraocular movements intact.     Conjunctiva/sclera: Conjunctivae normal.     Pupils: Pupils are equal, round, and reactive to light.  Cardiovascular:     Rate and Rhythm: Tachycardia present. Rhythm irregular.     Pulses: Normal pulses.     Heart sounds: Normal heart sounds.  Pulmonary:     Effort: Pulmonary effort is normal.     Breath sounds: Normal breath sounds.  Abdominal:     General: Abdomen is flat. Bowel sounds are normal.     Palpations: Abdomen is soft.  Musculoskeletal:        General: Normal range of motion.     Cervical back: Normal range of motion and neck supple.  Skin:    General: Skin is warm.     Capillary Refill: Capillary refill takes less than 2 seconds.  Neurological:     General: No focal deficit present.      Mental Status: She is alert and oriented to person, place, and time.  Psychiatric:        Mood and Affect: Mood normal.        Behavior: Behavior normal.     ED Results / Procedures / Treatments   Labs (all labs ordered are listed, but only abnormal results are displayed) Labs Reviewed  BASIC METABOLIC PANEL - Abnormal; Notable for the following components:      Result Value   Glucose, Bld 254 (*)    Creatinine, Ser 1.04 (*)    GFR, Estimated 58 (*)    All other components within normal limits  MAGNESIUM - Abnormal; Notable for the following components:   Magnesium 1.5 (*)    All other components within normal limits  CBC - Abnormal; Notable for the following components:   RBC 5.15 (*)    MCH 25.0 (*)    All other components within normal limits  TSH  URINALYSIS, ROUTINE W REFLEX MICROSCOPIC    EKG None  Radiology DG Chest Port 1 View  Result Date: 02/05/2022 CLINICAL DATA:  Back pain and shortness of breath, initial encounter EXAM: PORTABLE CHEST 1 VIEW COMPARISON:  12/18/2021 FINDINGS: The heart size and mediastinal contours are within normal limits. Both lungs are clear. The visualized skeletal structures are unremarkable. IMPRESSION: No active disease. Electronically Signed   By: Alcide Clever M.D.   On: 02/05/2022 23:14    Procedures Procedures  {Document cardiac monitor, telemetry assessment procedure when appropriate:1}  Medications Ordered in ED Medications  diltiazem (CARDIZEM) 1 mg/mL load via infusion 20 mg (has no administration in time range)    And  diltiazem (CARDIZEM) 125 mg in dextrose 5% 125 mL (1 mg/mL) infusion (has no administration in time range)  midazolam (VERSED) injection 0.5 mg (has no administration in time range)  etomidate (AMIDATE) injection (10 mg Intravenous Given 02/05/22 2348)  etomidate (AMIDATE) injection 10 mg (10 mg Intravenous Given 02/05/22 2344)    ED Course/ Medical Decision Making/ A&P   {   Click here for ABCD2, HEART  and other calculatorsREFRESH Note before signing :1}                          Medical Decision Making Amount and/or Complexity of Data Reviewed Labs: ordered. Radiology: ordered.  Risk Prescription drug management.   ***  {Document critical care time when appropriate:1} {Document review of labs  and clinical decision tools ie heart score, Chads2Vasc2 etc:1}  {Document your independent review of radiology images, and any outside records:1} {Document your discussion with family members, caretakers, and with consultants:1} {Document social determinants of health affecting pt's care:1} {Document your decision making why or why not admission, treatments were needed:1} Final Clinical Impression(s) / ED Diagnoses Final diagnoses:  Atrial fibrillation with RVR ([place])    Rx / DC Orders ED Discharge Orders     None

## 2022-02-06 NOTE — ED Notes (Signed)
The pt reports that she is ready to go home  msr on the monitor

## 2022-02-06 NOTE — ED Provider Notes (Signed)
[place] EMERGENCY DEPARTMENT AT [facility] Provider Note   CSN: [id number] Arrival date & time: 02/05/22  2232     History  Chief Complaint  Patient presents with   Back Pain    [name] [name] is a 72 y.o. female.  Pt is a 72 yo female with pmhx significant for afib on Eliquis, hld, dm, and gerd.  Pt said she felt sob and had some back pain and felt her heart racing.  She called her daughter who told her to call EMS.  EMS found pt to be in afib with rvr.  They gave her 10 mg IV and HR went down, but it came back up.  Pt denies f/c.  She has been compliant with her eliquis.       Home Medications Prior to Admission medications   Medication Sig Start Date End Date Taking? Authorizing Provider  acetaminophen (TYLENOL) 650 MG CR tablet Take 1 tablet (650 mg total) by mouth every 8 (eight) hours as needed for pain. 12/20/21   Maury Dus, MD  amiodarone (PACERONE) 200 MG tablet Take 1 tablet (200 mg total) by mouth daily. 01/06/22 02/06/22  Dana Allan, MD  amLODipine (NORVASC) 10 MG tablet Take 1 tablet (10 mg total) by mouth at bedtime. 01/06/22   Dana Allan, MD  apixaban (ELIQUIS) 5 MG TABS tablet Take 1 tablet (5 mg total) by mouth 2 (two) times daily. 01/06/22 02/06/22  Dana Allan, MD  atorvastatin (LIPITOR) 80 MG tablet TAKE 1 TABLET(80 MG) BY MOUTH DAILY 01/06/22   Dana Allan, MD  blood glucose meter kit and supplies Dispense based on patient and insurance preference. Use up to four times daily as directed. (FOR ICD-10 E10.9, E11.9). 01/06/22   Dana Allan, MD  Continuous Blood Gluc Sensor (FREESTYLE LIBRE 2 SENSOR) MISC Apply 1 to skin every 14 days 01/18/22   Dana Allan, MD  insulin glargine (LANTUS SOLOSTAR) 100 UNIT/ML Solostar Pen ADMINISTER 27 UNITS UNDER THE SKIN in am and 20 units at night 01/08/22   Dana Allan, MD  Insulin Pen Needle (PEN NEEDLES) 32G X 6 MM MISC 32G x 6 MM pen needles 01/08/22   Dana Allan, MD  metoprolol succinate  (TOPROL XL) 50 MG 24 hr tablet Take 1 tablet (50 mg total) by mouth daily. Take with or immediately following a meal. 01/06/22 02/06/22  Dana Allan, MD  pantoprazole (PROTONIX) 40 MG tablet TAKE 1 TABLET(40 MG) BY MOUTH DAILY 01/06/22   Dana Allan, MD  Semaglutide (RYBELSUS) 3 MG TABS Take 3 mg by mouth daily. 01/06/22   Dana Allan, MD      Allergies    Food, Pacerone [amiodarone], Peanut (diagnostic), Toprol xl [metoprolol], Asa [aspirin], and Lactose intolerance (gi)    Review of Systems   Review of Systems  Cardiovascular:  Positive for palpitations.    Physical Exam Updated Vital Signs BP (!) 146/88   Pulse 73   Resp (!) 22   Ht 5\' 3"  (1.6 m)   Wt 90.7 kg   SpO2 100%   BMI 35.42 kg/m  Physical Exam Vitals and nursing note reviewed.  Constitutional:      Appearance: Normal appearance. She is obese.  HENT:     Head: Normocephalic and atraumatic.     Right Ear: External ear normal.     Left Ear: External ear normal.     Nose: Nose normal.     Mouth/Throat:     Mouth: Mucous membranes are moist.  Pharynx: Oropharynx is clear.  Eyes:     Extraocular Movements: Extraocular movements intact.     Conjunctiva/sclera: Conjunctivae normal.     Pupils: Pupils are equal, round, and reactive to light.  Cardiovascular:     Rate and Rhythm: Tachycardia present. Rhythm irregular.     Pulses: Normal pulses.     Heart sounds: Normal heart sounds.  Pulmonary:     Effort: Pulmonary effort is normal.     Breath sounds: Normal breath sounds.  Abdominal:     General: Abdomen is flat. Bowel sounds are normal.     Palpations: Abdomen is soft.  Musculoskeletal:        General: Normal range of motion.     Cervical back: Normal range of motion and neck supple.  Skin:    General: Skin is warm.     Capillary Refill: Capillary refill takes less than 2 seconds.  Neurological:     General: No focal deficit present.     Mental Status: She is alert and oriented to person, place, and  time.  Psychiatric:        Mood and Affect: Mood normal.        Behavior: Behavior normal.     ED Results / Procedures / Treatments   Labs (all labs ordered are listed, but only abnormal results are displayed) Labs Reviewed  BASIC METABOLIC PANEL - Abnormal; Notable for the following components:      Result Value   Glucose, Bld 254 (*)    Creatinine, Ser 1.04 (*)    GFR, Estimated 58 (*)    All other components within normal limits  MAGNESIUM - Abnormal; Notable for the following components:   Magnesium 1.5 (*)    All other components within normal limits  CBC - Abnormal; Notable for the following components:   RBC 5.15 (*)    MCH 25.0 (*)    All other components within normal limits  TSH  URINALYSIS, ROUTINE W REFLEX MICROSCOPIC    EKG EKG Interpretation  Date/Time:  Friday February 05 2022 23:50:40 EST Ventricular Rate:  87 PR Interval:  155 QRS Duration: 97 QT Interval:  356 QTC Calculation: 429 R Axis:   74 Text Interpretation: Sinus rhythm Abnormal R-wave progression, early transition Minimal ST depression, diffuse leads now in NSR after 200J Confirmed by Jacalyn Lefevre [phone]) on 02/06/2022 12:05:04 AM  Radiology DG Chest Port 1 View  Result Date: 02/05/2022 CLINICAL DATA:  Back pain and shortness of breath, initial encounter EXAM: PORTABLE CHEST 1 VIEW COMPARISON:  12/18/2021 FINDINGS: The heart size and mediastinal contours are within normal limits. Both lungs are clear. The visualized skeletal structures are unremarkable. IMPRESSION: No active disease. Electronically Signed   By: Alcide Clever M.D.   On: 02/05/2022 23:14    Procedures .Cardioversion  Date/Time: 02/06/2022 12:08 AM  Performed by: Jacalyn Lefevre, MD Authorized by: Jacalyn Lefevre, MD   Consent:    Consent obtained:  Written   Consent given by:  Patient   Alternatives discussed:  No treatment Pre-procedure details:    Cardioversion basis:  Emergent   Rhythm:  Atrial fibrillation    Electrode placement:  Anterior-posterior Attempt one:    Waveform:  Biphasic   Shock (Joules):  200   Shock outcome:  Conversion to normal sinus rhythm Post-procedure details:    Patient tolerance of procedure:  Tolerated well, no immediate complications .Sedation  Date/Time: 02/06/2022 12:08 AM  Performed by: Jacalyn Lefevre, MD Authorized by: Jacalyn Lefevre, MD  Consent:    Consent obtained:  Written   Consent given by:  Patient Universal protocol:    Immediately prior to procedure, a time out was called: yes     Patient identity confirmed:  Verbally with patient Indications:    Procedure performed:  Cardioversion Pre-sedation assessment:    Time since last food or drink:  5   ASA classification: class 3 - patient with severe systemic disease     Mallampati score:  III - soft palate, base of uvula visible   Pre-sedation assessments completed and reviewed: airway patency, cardiovascular function, hydration status, mental status, nausea/vomiting and pain level   Immediate pre-procedure details:    Reassessment: Patient reassessed immediately prior to procedure     Reviewed: vital signs, relevant labs/tests and NPO status     Verified: bag valve mask available, emergency equipment available, intubation equipment available and IV patency confirmed   Procedure details (see MAR for exact dosages):    Preoxygenation:  Room air   Sedation:  Etomidate   Intended level of sedation: deep   Intra-procedure monitoring:  Blood pressure monitoring, cardiac monitor, continuous capnometry, continuous pulse oximetry, frequent LOC assessments and frequent vital sign checks   Intra-procedure events: none     Total Provider sedation time (minutes):  30 Post-procedure details:    Post-sedation assessment completed:  02/06/2022 12:09 AM   Attendance: Constant attendance by certified staff until patient recovered     Recovery: Patient returned to pre-procedure baseline     Patient is stable for  discharge or admission: yes     Procedure completion:  Tolerated well, no immediate complications     Medications Ordered in ED Medications  midazolam (VERSED) injection 0.5 mg (has no administration in time range)  magnesium sulfate IVPB 2 g 50 mL (has no administration in time range)  etomidate (AMIDATE) injection 10 mg (10 mg Intravenous Given 02/05/22 2344)  etomidate (AMIDATE) injection (10 mg Intravenous Given 02/05/22 2348)    ED Course/ Medical Decision Making/ A&P                             Medical Decision Making Amount and/or Complexity of Data Reviewed Labs: ordered. Radiology: ordered.  Risk Prescription drug management.   This patient presents to the ED for concern of palpitations, this involves an extensive number of treatment options, and is a complaint that carries with it a high risk of complications and morbidity.  The differential diagnosis includes afib, svt, electrolyte abn   Co morbidities that complicate the patient evaluation  afib on Eliquis, hld, dm, and gerd   Additional history obtained:  Additional history obtained from epic chart review External records from outside source obtained and reviewed including EMS report, daughter   Lab Tests:  I Ordered, and personally interpreted labs.  The pertinent results include:  cbc nl, bmp nl other than glucose elevated at 254; mg low at 1.5   Imaging Studies ordered:  I ordered imaging studies including cxr  I independently visualized and interpreted imaging which showed No active disease.  I agree with the radiologist interpretation   Cardiac Monitoring:  The patient was maintained on a cardiac monitor.  I personally viewed and interpreted the cardiac monitored which showed an underlying rhythm of: afib with rvr; now nsr   Medicines ordered and prescription drug management:  I ordered medication including magnesium  for low magnesium  Reevaluation of the patient after these medicines  showed  that the patient improved I have reviewed the patients home medicines and have made adjustments as needed   Critical Interventions:  cardioversion   Problem List / ED Course:  Afib with RVR:  pt had a successful direct cardioversion.  She is now in NSR. Hypomag:  pt given 2 g Mg   Reevaluation:  After the interventions noted above, I reevaluated the patient and found that they have :improved   Social Determinants of Health:  Lives at home   Dispostion:  After consideration of the diagnostic results and the patients response to treatment, I feel that the patent would benefit from discharge with outpatient f/u.          Final Clinical Impression(s) / ED Diagnoses Final diagnoses:  Atrial fibrillation with RVR ([place])  Hypomagnesemia    Rx / DC Orders ED Discharge Orders     None         Isla Pence, MD 02/06/22 0009

## 2022-04-02 ENCOUNTER — Other Ambulatory Visit: Payer: Self-pay | Admitting: Family Medicine

## 2022-04-02 DIAGNOSIS — I4891 Unspecified atrial fibrillation: Secondary | ICD-10-CM

## 2022-04-13 ENCOUNTER — Encounter: Payer: Self-pay | Admitting: Family Medicine

## 2022-04-13 ENCOUNTER — Ambulatory Visit
Admission: RE | Admit: 2022-04-13 | Discharge: 2022-04-13 | Disposition: A | Discharge status: Home / Self Care | Payer: Medicare Other | Attending: Family Medicine | Admitting: Family Medicine

## 2022-04-13 ENCOUNTER — Ambulatory Visit
Admission: RE | Admit: 2022-04-13 | Discharge: 2022-04-13 | Disposition: A | Discharge status: Home / Self Care | Payer: Medicare Other | Source: Ambulatory Visit | Attending: Family Medicine | Admitting: Family Medicine

## 2022-04-13 ENCOUNTER — Ambulatory Visit (INDEPENDENT_AMBULATORY_CARE_PROVIDER_SITE_OTHER): Payer: Medicare Other | Admitting: Family Medicine

## 2022-04-13 VITALS — BP 140/80 | HR 91 | Temp 98.6°F | Ht 62.0 in | Wt 203.6 lb

## 2022-04-13 DIAGNOSIS — R0609 Other forms of dyspnea: Secondary | ICD-10-CM | POA: Insufficient documentation

## 2022-04-13 DIAGNOSIS — I152 Hypertension secondary to endocrine disorders: Secondary | ICD-10-CM

## 2022-04-13 DIAGNOSIS — I5042 Chronic combined systolic (congestive) and diastolic (congestive) heart failure: Secondary | ICD-10-CM

## 2022-04-13 DIAGNOSIS — Z1211 Encounter for screening for malignant neoplasm of colon: Secondary | ICD-10-CM

## 2022-04-13 DIAGNOSIS — R600 Localized edema: Secondary | ICD-10-CM

## 2022-04-13 DIAGNOSIS — K222 Esophageal obstruction: Secondary | ICD-10-CM | POA: Insufficient documentation

## 2022-04-13 DIAGNOSIS — E1165 Type 2 diabetes mellitus with hyperglycemia: Secondary | ICD-10-CM

## 2022-04-13 DIAGNOSIS — E1159 Type 2 diabetes mellitus with other circulatory complications: Secondary | ICD-10-CM | POA: Diagnosis not present

## 2022-04-13 DIAGNOSIS — K219 Gastro-esophageal reflux disease without esophagitis: Secondary | ICD-10-CM

## 2022-04-13 DIAGNOSIS — E785 Hyperlipidemia, unspecified: Secondary | ICD-10-CM

## 2022-04-13 DIAGNOSIS — E1169 Type 2 diabetes mellitus with other specified complication: Secondary | ICD-10-CM

## 2022-04-13 DIAGNOSIS — I4891 Unspecified atrial fibrillation: Secondary | ICD-10-CM

## 2022-04-13 MED ORDER — ATORVASTATIN CALCIUM 80 MG PO TABS: ORAL_TABLET | ORAL | 2 refills | Status: DC

## 2022-04-13 MED ORDER — OZEMPIC (0.25 OR 0.5 MG/DOSE) 2 MG/1.5ML ~~LOC~~ SOPN: 0.2500 mg | PEN_INJECTOR | SUBCUTANEOUS | 0 refills | Status: DC

## 2022-04-13 MED ORDER — LANTUS SOLOSTAR 100 UNIT/ML ~~LOC~~ SOPN: PEN_INJECTOR | SUBCUTANEOUS | 2 refills | Status: DC

## 2022-04-13 MED ORDER — FUROSEMIDE 40 MG PO TABS: 40.0000 mg | ORAL_TABLET | Freq: Every day | ORAL | 3 refills | Status: DC

## 2022-04-13 MED ORDER — PANTOPRAZOLE SODIUM 40 MG PO TBEC: DELAYED_RELEASE_TABLET | ORAL | 2 refills | Status: DC

## 2022-04-13 MED ORDER — METOPROLOL SUCCINATE ER 50 MG PO TB24: 50.0000 mg | ORAL_TABLET | Freq: Every day | ORAL | 0 refills | Status: DC

## 2022-04-13 MED ORDER — AMLODIPINE BESYLATE 10 MG PO TABS: 10.0000 mg | ORAL_TABLET | Freq: Every day | ORAL | 1 refills | Status: DC

## 2022-04-13 MED ORDER — PEN NEEDLES 32G X 6 MM MISC: 0 refills | Status: DC

## 2022-04-13 MED ORDER — FREESTYLE LIBRE 2 SENSOR MISC: 5 refills | Status: DC

## 2022-04-13 NOTE — Patient Instructions (Addendum)
It was a pleasure meeting you today. Thank you for allowing me to take part in your health care.  Our goals for today as we discussed include:   Take Lasix 40 mg two times a day for 3 days   Refills sent to pharmacy for medications requested  Will get blood work today  Start Ozempic 0.25 mg weekly once able to get medication Continue to monitor blood sugar  Follow up in 2 days   Call cardiology office for refill of Amiodarone  If you have any questions or concerns, please do not hesitate to call the office at [phone].  I look forward to our next visit and until then take care and stay safe.  Regards,   Carollee Leitz, MD   [facility]

## 2022-04-13 NOTE — Progress Notes (Signed)
SUBJECTIVE:   Chief Complaint  Patient presents with   Medical Management of Chronic Issues    Swelling in legs/ send all refills to new pharmcay   HPI Patient presents to clinic for follow-up chronic disease management.  Lower extremity edema. Was recently visiting grandson in [place] and reports increased take out meals.  Had not been watching sodium intake.  Not currently on any diuretics.  Denies any chest pain, heart palpitations or shortness of breath at rest.  Endorses exertional shortness of breath and walking short distances.  Has not followed up with her cardiologist since December 2023.  Endorses having an appointment scheduled for June.  DM type II. Currently has freestyle libre 2.  Seems to think that the sensor is not working.  Review of glucose ranges 250-350.  Patient reports this is inaccurate but does not do fingerstick to confirm accuracy.  She recently increased her insulin Lantus from 27 units a.m. to 30 units a.m. and continues to use 20 units at night.  Had not started Rybelsus as previously prescribed.  Did not tolerate metformin, Jardiance, Januvia in the past.  Today she is open to initiating Ozempic weekly injectable.  Hypertension Blood pressure not well-controlled.  Does not check blood pressure at home.  Current medications include Norvasc 10 mg nightly, metoprolol XL 50 mg daily.  Was previously prescribed Entresto for CHF, patient self discontinued medication.  Was started on losartan but discontinued secondary to persistent hyperkalemia.  Denies any headaches, visual changes, chest pain or heart palpitations.  A-fib Takes metoprolol XL 50 mg daily, amiodarone 200 mg daily and Eliquis 5 mg twice daily.  Reports compliancy with medications.  Denies any increase in heart rate, palpitations or chest pain.  No signs of bleeding, hemoptysis, hematuria or hematochezia.    PERTINENT PMH / PSH: DM type II HFrEF Hypertension   OBJECTIVE:  BP (!) 140/80    Pulse 91   Temp 98.6 F (37 C) (Oral)   Ht 5\' 2"  (1.575 m)   Wt 203 lb 9.6 oz (92.4 kg)   SpO2 95%   BMI 37.24 kg/m    Physical Exam Vitals reviewed.  Constitutional:      General: She is not in acute distress.    Appearance: She is not ill-appearing.  HENT:     Head: Normocephalic.     Nose: Nose normal.  Eyes:     Conjunctiva/sclera: Conjunctivae normal.  Neck:     Thyroid: No thyromegaly or thyroid tenderness.     Vascular: No carotid bruit or JVD.  Cardiovascular:     Rate and Rhythm: Normal rate and regular rhythm.     Chest Wall: PMI is not displaced.     Pulses: Normal pulses.     Heart sounds: No murmur heard. Pulmonary:     Effort: Pulmonary effort is normal. No respiratory distress.     Breath sounds: Normal breath sounds. No wheezing, rhonchi or rales.  Abdominal:     General: Bowel sounds are normal. There is distension. There is no abdominal bruit.     Palpations: Abdomen is soft. There is no shifting dullness, fluid wave, mass or pulsatile mass.  Musculoskeletal:        General: Swelling present. Normal range of motion.     Cervical back: Normal range of motion.     Right lower leg: 3+ Pitting Edema present.     Left lower leg: 3+ Pitting Edema present.  Skin:    General: Skin is warm.  Coloration: Skin is not jaundiced.  Neurological:     Mental Status: She is alert and oriented to person, place, and time. Mental status is at baseline.  Psychiatric:        Mood and Affect: Mood normal.        Behavior: Behavior normal.        Thought Content: Thought content normal.        Judgment: Judgment normal.     ASSESSMENT/PLAN:  Bilateral lower extremity edema Assessment & Plan: Chronic.  Low suspicion for PE/DVT given no asymmetry, tachycardia or chest pain.  Less likely CHF exacerbation given lungs clear on exam.  My interpretation of chest x-ray did not show any increased interstitial prominent findings.  Likely increased takeout food, salt intake  and uncontrolled diabetes Start Lasix 40 mg twice daily x 3 days then daily CMET, BNP, CBC today. Follow-up in 2 days. Strict return precautions provided.    Colon cancer screening Assessment & Plan: No previous colonoscopy on file Patient agreeable to referral for colonoscopy today.  Orders: -     Ambulatory referral to Gastroenterology  Type 2 diabetes mellitus with hyperglycemia, unspecified whether long term insulin use Assessment & Plan: Chronic.  Uncontrolled.  Last A1c 10.2.  Self increased Lantus from 27 units a.m to 30 units a.m. and 20 units p.m secondary to increased takeout food over the last 2 weeks.  Had not started Rybelsus as previously prescribed Check A1c today Continue Lantus 30 units a.m. and 27 units p.m. Start Ozempic 0.25 mg weekly, plan to increase to 2.5 mg in 4 weeks.    Orders: -     Pen Needles; 32G x 6 MM pen needles  Dispense: 100 each; Refill: 0 -     Lantus SoloStar; ADMINISTER 30 UNITS UNDER THE SKIN in am and 20 units at night  Dispense: 15 mL; Refill: 2 -     FreeStyle Libre 2 Sensor; Apply 1 to skin every 14 days  Dispense: 1 each; Refill: 5 -     Ozempic (0.25 or 0.5 MG/DOSE); Inject 0.25 mg into the skin once a week.  Dispense: 1.5 mL; Refill: 0 -     Hemoglobin A1c -     CBC with Differential/Platelet  Gastroesophageal reflux disease without esophagitis Assessment & Plan: Chronic.  Stable. Refill Protonix.  Orders: -     Pantoprazole Sodium; TAKE 1 TABLET(40 MG) BY MOUTH DAILY  Dispense: 90 tablet; Refill: 2  Atrial fibrillation with RVR Assessment & Plan: Chronic.  Controlled rate. Continue amiodarone 200 mg daily Continue Eliquis 5 mg twice daily Continue metoprolol XL 50 mg daily Follow-up with cardiology in June as scheduled.  Orders: -     Metoprolol Succinate ER; Take 1 tablet (50 mg total) by mouth daily. Take with or immediately following a meal.  Dispense: 30 tablet; Refill: 0  Hyperlipidemia associated with type 2  diabetes mellitus -     Atorvastatin Calcium; TAKE 1 TABLET(80 MG) BY MOUTH DAILY  Dispense: 90 tablet; Refill: 2  Hypertension associated with diabetes Assessment & Plan: Chronic.  Not at goal.  Likely secondary to increased sodium intake while on holidays. Patient continues to decline new medications secondary to multiple sensitivities. Continue amlodipine 10 mg nightly Continue metoprolol XL 50 mg daily, could consider switching to carvedilol Follow-up with cardiology as scheduled   Orders: -     amLODIPine Besylate; Take 1 tablet (10 mg total) by mouth at bedtime.  Dispense: 90 tablet; Refill: 1 -  Comprehensive metabolic panel  Dyspnea on exertion Assessment & Plan: Suspect deconditioning and increased weight. Chest x-ray negative for acute cardiopulmonary disease.   BNP pending  Orders: -     DG Chest 2 View; Future -     Brain natriuretic peptide  Chronic combined systolic and diastolic CHF (congestive heart failure) Assessment & Plan: Chronic.  Bilateral lower extremity edema extending upward to the thighs. Chest x-ray BNP, CMET, CBC, TSH today Reiterated the patient needs to follow-up with cardiology for medication refills of amiodarone, Eliquis and metoprolol.   Orders: -     Furosemide; Take 1 tablet (40 mg total) by mouth daily.  Dispense: 30 tablet; Refill: 3  HCM Colonoscopy referral resent today.  Previously sent 12/23. Medicare annual wellness due. Ophthalmology referral sent 12/23.  Patient aware of yearly eye exams. Diabetic foot exam annually.  Referral has been sent 12/23 to podiatry.  PDMP reviewed  Return in about 2 days (around 04/15/2022) for PCP.  Dana Allan, MD

## 2022-04-14 ENCOUNTER — Other Ambulatory Visit (HOSPITAL_COMMUNITY): Payer: Self-pay

## 2022-04-14 ENCOUNTER — Telehealth: Payer: Self-pay

## 2022-04-14 LAB — CBC WITH DIFFERENTIAL/PLATELET
Basophils Absolute: 0.1 10*3/uL (ref 0.0–0.1)
Basophils Relative: 1.3 % (ref 0.0–3.0)
Eosinophils Absolute: 0.3 10*3/uL (ref 0.0–0.7)
Eosinophils Relative: 4 % (ref 0.0–5.0)
HCT: 40.9 % (ref 36.0–46.0)
Hemoglobin: 13.2 g/dL (ref 12.0–15.0)
Lymphocytes Relative: 30.1 % (ref 12.0–46.0)
Lymphs Abs: 2.5 10*3/uL (ref 0.7–4.0)
MCHC: 32.4 g/dL (ref 30.0–36.0)
MCV: 77.9 fl — ABNORMAL LOW (ref 78.0–100.0)
Monocytes Absolute: 0.8 10*3/uL (ref 0.1–1.0)
Monocytes Relative: 9 % (ref 3.0–12.0)
Neutro Abs: 4.7 10*3/uL (ref 1.4–7.7)
Neutrophils Relative %: 55.6 % (ref 43.0–77.0)
Platelets: 219 10*3/uL (ref 150.0–400.0)
RBC: 5.24 Mil/uL — ABNORMAL HIGH (ref 3.87–5.11)
RDW: 15.1 % (ref 11.5–15.5)
WBC: 8.4 10*3/uL (ref 4.0–10.5)

## 2022-04-14 LAB — COMPREHENSIVE METABOLIC PANEL
ALT: 15 U/L (ref 0–35)
AST: 13 U/L (ref 0–37)
Albumin: 4.1 g/dL (ref 3.5–5.2)
Alkaline Phosphatase: 108 U/L (ref 39–117)
BUN: 18 mg/dL (ref 6–23)
CO2: 26 mEq/L (ref 19–32)
Calcium: 9.6 mg/dL (ref 8.4–10.5)
Chloride: 101 mEq/L (ref 96–112)
Creatinine, Ser: 1.01 mg/dL (ref 0.40–1.20)
GFR: 56.46 mL/min — ABNORMAL LOW (ref >60.00)
Glucose, Bld: 276 mg/dL — ABNORMAL HIGH (ref 70–99)
Potassium: 4.3 mEq/L (ref 3.5–5.1)
Sodium: 134 mEq/L — ABNORMAL LOW (ref 135–145)
Total Bilirubin: 0.3 mg/dL (ref 0.2–1.2)
Total Protein: 7.6 g/dL (ref 6.0–8.3)

## 2022-04-14 LAB — BRAIN NATRIURETIC PEPTIDE: Brain Natriuretic Peptide: 64 pg/mL (ref <100)

## 2022-04-14 LAB — HEMOGLOBIN A1C: Hgb A1c MFr Bld: 10.9 % — ABNORMAL HIGH (ref 4.6–6.5)

## 2022-04-14 NOTE — Telephone Encounter (Signed)
Pharmacy Patient Advocate Encounter   Received notification from [place] that prior authorization for Ozempic (0.25 or 0.5 MG/DOSE) 2MG /3ML pen-injectors is required/requested.  Per Test Claim: PA required   PA submitted on 04/14/22 to (ins) [place] Medicaid via [employer] or [facility]) confirmation # [id number] W Status is pending

## 2022-04-14 NOTE — Telephone Encounter (Signed)
I called  and informed the pharmacy that the patients ozempic was approved.  Tephanie Escorcia,cma

## 2022-04-14 NOTE — Telephone Encounter (Signed)
Patient Advocate Encounter  Prior Authorization for Ozempic (0.25 or 0.5 MG/DOSE) 2MG /3ML pen-injectors  has been approved.    PA# [id number] Effective dates: 04/14/22 through 04/14/23

## 2022-04-15 ENCOUNTER — Ambulatory Visit (INDEPENDENT_AMBULATORY_CARE_PROVIDER_SITE_OTHER): Payer: Medicare Other | Admitting: Family Medicine

## 2022-04-15 VITALS — BP 150/70 | HR 91 | Temp 98.8°F | Ht 63.0 in | Wt 205.8 lb

## 2022-04-15 DIAGNOSIS — N1831 Chronic kidney disease, stage 3a: Secondary | ICD-10-CM

## 2022-04-15 DIAGNOSIS — Z5181 Encounter for therapeutic drug level monitoring: Secondary | ICD-10-CM

## 2022-04-15 DIAGNOSIS — Z794 Long term (current) use of insulin: Secondary | ICD-10-CM

## 2022-04-15 DIAGNOSIS — E1165 Type 2 diabetes mellitus with hyperglycemia: Secondary | ICD-10-CM

## 2022-04-15 DIAGNOSIS — R0609 Other forms of dyspnea: Secondary | ICD-10-CM

## 2022-04-15 DIAGNOSIS — R601 Generalized edema: Secondary | ICD-10-CM

## 2022-04-15 DIAGNOSIS — R600 Localized edema: Secondary | ICD-10-CM

## 2022-04-15 DIAGNOSIS — E1159 Type 2 diabetes mellitus with other circulatory complications: Secondary | ICD-10-CM | POA: Diagnosis not present

## 2022-04-15 DIAGNOSIS — N182 Chronic kidney disease, stage 2 (mild): Secondary | ICD-10-CM

## 2022-04-15 DIAGNOSIS — E1122 Type 2 diabetes mellitus with diabetic chronic kidney disease: Secondary | ICD-10-CM

## 2022-04-15 DIAGNOSIS — Z79899 Other long term (current) drug therapy: Secondary | ICD-10-CM

## 2022-04-15 DIAGNOSIS — I152 Hypertension secondary to endocrine disorders: Secondary | ICD-10-CM

## 2022-04-15 MED ORDER — POTASSIUM CHLORIDE ER 10 MEQ PO TBCR: 10.0000 meq | EXTENDED_RELEASE_TABLET | Freq: Every day | ORAL | 0 refills | Status: DC

## 2022-04-15 NOTE — Progress Notes (Signed)
SUBJECTIVE:   Chief Complaint  Patient presents with   Medical Management of Chronic Issues   HPI Patient presents to clinic for follow-up lower extremity edema.  Significantly improved since initiation of Lasix 40 mg twice daily.  Recent labs reviewed significant for mild hyponatremia, hyperglycemia, normal BNP normal.  Denies any chest pain, heart palpitations.  Endorses adequate urinary output.  Weight remains unchanged.  Patient reports weight at home lower than current weight at clinic.  Shortness of breath has improved since fluid has decreased   Diabetes type 2 Recent A1c 10.9.  Remains elevated.  Previous check 10.2.  Patient reports compliancy with medication.  Currently on Lantus 30 units a.m. and 20 units p.m.  Ozempic was approved and patient was able to pick up medication.  Received first dose of 0.25 mg.  Hypertension Asymptomatic.  Currently on amlodipine 10 mg, metoprolol XL 25 mg daily.  Reports compliancy.  Discussed initiation of ARB.  Patient resistant to addition of new medications.  Given reports increased sensitivity to medications.  Marland Kitchen PERTINENT PMH / PSH: Diabetes type 2, uncontrolled Hypertension HFrEF CKD stage III a Paroxysmal A-fib Hyperlipidemia   OBJECTIVE:  BP (!) 150/70   Pulse 91   Temp 98.8 F (37.1 C)   Ht 5\' 3"  (1.6 m)   Wt 205 lb 12.8 oz (93.4 kg)   SpO2 93%   BMI 36.46 kg/m    Physical Exam Vitals reviewed.  Constitutional:      General: She is not in acute distress.    Appearance: Normal appearance. She is normal weight. She is not ill-appearing, toxic-appearing or diaphoretic.  Eyes:     General:        Right eye: No discharge.        Left eye: No discharge.     Conjunctiva/sclera: Conjunctivae normal.  Cardiovascular:     Rate and Rhythm: Normal rate and regular rhythm.     Heart sounds: Normal heart sounds.  Pulmonary:     Effort: Pulmonary effort is normal.     Breath sounds: Normal breath sounds. No wheezing or  rhonchi.  Abdominal:     General: Bowel sounds are normal.  Musculoskeletal:        General: Normal range of motion.     Right lower leg: Edema present.     Left lower leg: Edema present.  Skin:    General: Skin is warm and dry.  Neurological:     General: No focal deficit present.     Mental Status: She is alert and oriented to person, place, and time. Mental status is at baseline.  Psychiatric:        Mood and Affect: Mood normal.        Behavior: Behavior normal.        Thought Content: Thought content normal.        Judgment: Judgment normal.     ASSESSMENT/PLAN:  Type 2 diabetes mellitus with hyperglycemia, with long-term current use of insulin Assessment & Plan: Chronic.  Uncontrolled.  Recent A1c increased from 10.2-10.9.  Reports compliancy with medications.  Had increased a.m. Lantus dose. Increase Lantus to 35 units a.m. and 27 units p.m. Continue Ozempic to 0.25 mg weekly, plan for increasing 0.5 mg if continues to tolerate On statin Blood pressure not well-controlled.  Declines ACEi/ARB Referred to endocrinology Follow-up in 1 week  Orders: -     Ambulatory referral to Endocrinology -     Lantus SoloStar; ADMINISTER 35 UNITS UNDER THE SKIN  in am and 20 units at night  Dispense: 15 mL; Refill: 2  CKD stage 2 due to type 2 diabetes mellitus Assessment & Plan: Suspect bilateral lower extremity edema secondary to uncontrolled hypertension and type II DM Continue Lasix 40 mg daily for lower extremity edema Renal ultrasound Follow-up in 1 week.  Consider nephrology consult.  Orders: -     US RENAL; Future  Encounter for monitoring diuretic therapy Assessment & Plan: Loop diuretics initiated.  And currently on insulin recent dosing.  Add potassium 10 mEq daily    Hypertension associated with diabetes Assessment & Plan: Chronic.  Not at goal.  Patient continues to decline new medications secondary to multiple sensitivities. Continue amlodipine 10 mg  nightly Continue metoprolol XL 50 mg daily, could consider switching to carvedilol Follow-up with cardiology as scheduled   Bilateral lower extremity edema Assessment & Plan: Improved since addition of twice daily Lasix.  Still having 2+ pitting edema bilaterally now up to mid thigh.  Is clear on exam.  Recent creatinine 1.01 with GFR 56.46. Decrease Lasix to 40 mg daily. Add potassium 20 mEq daily Plan for renal ultrasound Follow-up in 1 week.  Will repeat BMP at that time   Stage 3a chronic kidney disease Assessment & Plan: Suspect bilateral lower extremity edema secondary to uncontrolled hypertension and type II DM Continue Lasix 40 mg daily for lower extremity edema Renal ultrasound Follow-up in 1 week.  Consider nephrology consult.   Dyspnea on exertion Assessment & Plan: Has improved with addition of diuretic and decreasing anasarca. Lung exam clear today, recent BNP 67 and chest x-ray did not indicate cardiology etiology. Recommend she follow-up with cardiology, currently on amiodarone. Recommend blood pressure control, diabetes control.      PDMP reviewed  Return in about 1 week (around 04/22/2022) for PCP.  Dana Allan, MD

## 2022-04-15 NOTE — Patient Instructions (Addendum)
It was a pleasure meeting you today. Thank you for allowing me to take part in your health care.  Our goals for today as we discussed include:  Increase Lantus morning dose to 35 units Continue Lantus eneving dose 20 units  Continue Ozempic 0.25 mg weekly.  Plan to increase in 4 weeks   Continue Lasix 40 mg two times a day until Sunday then take 40 mg one time a day Start Potassium 10 meq daily  Kidney ultrasound ordered.  There will call you with appointment   If you have any questions or concerns, please do not hesitate to call the office at [phone].  I look forward to our next visit and until then take care and stay safe.  Regards,   Carollee Leitz, MD   [facility]

## 2022-04-18 ENCOUNTER — Encounter: Payer: Self-pay | Admitting: Family Medicine

## 2022-04-18 DIAGNOSIS — R600 Localized edema: Secondary | ICD-10-CM | POA: Insufficient documentation

## 2022-04-18 DIAGNOSIS — R0609 Other forms of dyspnea: Secondary | ICD-10-CM | POA: Insufficient documentation

## 2022-04-18 NOTE — Assessment & Plan Note (Signed)
Chronic.  Controlled rate. Continue amiodarone 200 mg daily Continue Eliquis 5 mg twice daily Continue metoprolol XL 50 mg daily Follow-up with cardiology in June as scheduled.

## 2022-04-18 NOTE — Assessment & Plan Note (Signed)
No previous colonoscopy on file Patient agreeable to referral for colonoscopy today.

## 2022-04-18 NOTE — Assessment & Plan Note (Signed)
Chronic.  Bilateral lower extremity edema extending upward to the thighs. Chest x-ray BNP, CMET, CBC, TSH today Reiterated the patient needs to follow-up with cardiology for medication refills of amiodarone, Eliquis and metoprolol.

## 2022-04-18 NOTE — Assessment & Plan Note (Signed)
Chronic.  Not at goal.  Likely secondary to increased sodium intake while on holidays. Patient continues to decline new medications secondary to multiple sensitivities. Continue amlodipine 10 mg nightly Continue metoprolol XL 50 mg daily, could consider switching to carvedilol Follow-up with cardiology as scheduled

## 2022-04-18 NOTE — Assessment & Plan Note (Signed)
Chronic.  Uncontrolled.  Last A1c 10.2.  Self increased Lantus from 27 units a.m to 30 units a.m. and 20 units p.m secondary to increased takeout food over the last 2 weeks.  Had not started Rybelsus as previously prescribed Check A1c today Continue Lantus 30 units a.m. and 27 units p.m. Start Ozempic 0.25 mg weekly, plan to increase to 2.5 mg in 4 weeks.

## 2022-04-18 NOTE — Assessment & Plan Note (Signed)
Chronic.  Low suspicion for PE/DVT given no asymmetry, tachycardia or chest pain.  Less likely CHF exacerbation given lungs clear on exam.  My interpretation of chest x-ray did not show any increased interstitial prominent findings.  Likely increased takeout food, salt intake and uncontrolled diabetes Start Lasix 40 mg twice daily x 3 days then daily CMET, BNP, CBC today. Follow-up in 2 days. Strict return precautions provided.

## 2022-04-18 NOTE — Assessment & Plan Note (Signed)
Suspect deconditioning and increased weight. Chest x-ray negative for acute cardiopulmonary disease.   BNP pending

## 2022-04-18 NOTE — Assessment & Plan Note (Signed)
Chronic.  Stable. Refill Protonix. 

## 2022-04-21 ENCOUNTER — Encounter: Payer: Self-pay | Admitting: *Deleted

## 2022-04-21 LAB — HM DIABETES EYE EXAM

## 2022-04-22 ENCOUNTER — Encounter: Payer: Self-pay | Admitting: Family Medicine

## 2022-04-22 ENCOUNTER — Ambulatory Visit (INDEPENDENT_AMBULATORY_CARE_PROVIDER_SITE_OTHER): Payer: Medicare Other | Admitting: Family Medicine

## 2022-04-22 VITALS — BP 140/78 | HR 88 | Temp 98.6°F | Ht 63.0 in | Wt 203.8 lb

## 2022-04-22 DIAGNOSIS — I152 Hypertension secondary to endocrine disorders: Secondary | ICD-10-CM

## 2022-04-22 DIAGNOSIS — E11319 Type 2 diabetes mellitus with unspecified diabetic retinopathy without macular edema: Secondary | ICD-10-CM

## 2022-04-22 DIAGNOSIS — Z79899 Other long term (current) drug therapy: Secondary | ICD-10-CM | POA: Diagnosis not present

## 2022-04-22 DIAGNOSIS — E1165 Type 2 diabetes mellitus with hyperglycemia: Secondary | ICD-10-CM

## 2022-04-22 DIAGNOSIS — N1831 Chronic kidney disease, stage 3a: Secondary | ICD-10-CM

## 2022-04-22 DIAGNOSIS — R600 Localized edema: Secondary | ICD-10-CM

## 2022-04-22 DIAGNOSIS — Z794 Long term (current) use of insulin: Secondary | ICD-10-CM

## 2022-04-22 DIAGNOSIS — E1159 Type 2 diabetes mellitus with other circulatory complications: Secondary | ICD-10-CM

## 2022-04-22 DIAGNOSIS — Z5181 Encounter for therapeutic drug level monitoring: Secondary | ICD-10-CM

## 2022-04-22 MED ORDER — POTASSIUM CHLORIDE ER 10 MEQ PO TBCR
20.0000 meq | EXTENDED_RELEASE_TABLET | Freq: Every day | ORAL | 3 refills | Status: DC
Start: 2022-04-22 — End: 2022-06-03

## 2022-04-22 NOTE — Progress Notes (Signed)
SUBJECTIVE:   Chief Complaint  Patient presents with   Medical Management of Chronic Issues    1 mo follow up   HPI Patient presents to clinic for follow-up hypertension, diabetes management and lower extremity edema  Bilateral lower extremity edema Has significantly improved since initiation of Lasix 40 mg daily.  Denies any chest pain, shortness of breath.  She reports she is now able to fit in her boots as normal.  Hypertension Continues to remain uncontrolled despite compliancy with current medications.  Remains resistant to addition of antihypertensives reported multiple sensitivities.  Diabetes type 2 Reports CBGs at home slightly improved.  140s 150s now.  Currently taking Lantus 35 units a.m. and 20 units p.m.  Reports tolerating semaglutide 0.25 mg weekly.  Has had 2 doses thus far.  Plan to increase to 0.5 mg on week 5.  Patient agreeable to increasing medication.  PERTINENT PMH / PSH: Type 2 diabetes Hypertension Hyperlipidemia HFrEF   OBJECTIVE:  BP (!) 140/78   Pulse 88   Temp 98.6 F (37 C) (Oral)   Ht  (1.6 m)   Wt 203 lb 12.8 oz (92.4 kg)   SpO2 98%   BMI 36.10 kg/m    Physical Exam Vitals reviewed.  Constitutional:      General: She is not in acute distress.    Appearance: Normal appearance. She is normal weight. She is not ill-appearing, toxic-appearing or diaphoretic.  Eyes:     General:        Right eye: No discharge.        Left eye: No discharge.     Conjunctiva/sclera: Conjunctivae normal.  Cardiovascular:     Rate and Rhythm: Normal rate and regular rhythm.     Pulses: Normal pulses.     Heart sounds: Normal heart sounds.  Pulmonary:     Effort: Pulmonary effort is normal.     Breath sounds: Normal breath sounds.  Abdominal:     General: Bowel sounds are normal.  Musculoskeletal:        General: Normal range of motion.     Right lower leg: Edema present.     Left lower leg: Edema present.  Skin:    General: Skin is warm and  dry.  Neurological:     General: No focal deficit present.     Mental Status: She is alert and oriented to person, place, and time. Mental status is at baseline.  Psychiatric:        Mood and Affect: Mood normal.        Behavior: Behavior normal.        Thought Content: Thought content normal.        Judgment: Judgment normal.     ASSESSMENT/PLAN:  Hypertension associated with diabetes Assessment & Plan: Chronic.  Not at goal.  Patient continues to decline new medications secondary to multiple sensitivities.  Would like to start patient on ARB Continue amlodipine 10 mg nightly Continue metoprolol XL 50 mg daily, could consider switching to carvedilol Follow-up with cardiology as scheduled  Orders: -     Basic metabolic panel  Encounter for monitoring diuretic therapy -     Basic metabolic panel -     Potassium Chloride ER; Take 2 tablets (20 mEq total) by mouth daily.  Dispense: 90 tablet; Refill: 3  Diabetic retinopathy associated with type 2 diabetes mellitus, macular edema presence unspecified, unspecified laterality, unspecified retinopathy severity Assessment & Plan: Diabetic eye exam completed 04/26/22 Diabetic retinopathy noted  Type 2 diabetes mellitus with hyperglycemia, with long-term current use of insulin Assessment & Plan: Chronic.  Uncontrolled.  Recent A1c increased from 10.2-10.9.   Continue Lantus to 35 units a.m. and 20 units p.m. Continue Ozempic to 0.25 mg weekly, plan for increasing 0.5 mg if continues to tolerate On statin Blood pressure not well-controlled.  Declines ACEi/ARB Endocrinology appointment has been scheduled Follow-up in 4-5 weeks   Stage 3a chronic kidney disease Assessment & Plan: Recent renal ultrasound normal.  No hydronephrosis. Continue Lasix 40 mg daily Continue potassium 10 mEq daily Bmet today Consider nephrology consult. Follow-up with results     Bilateral lower extremity edema Assessment & Plan: Improved since  addition of twice daily Lasix.  Bilateral lower extremity edema has decreased now to calf and 1+ pitting.  Lungs clear on exam.   Continue Lasix to 40 mg daily. Continue potassium 10 mEq daily Check labs     PDMP reviewed  Return in about 5 weeks (around 05/27/2022).  Dana Allan, MD

## 2022-04-22 NOTE — Patient Instructions (Addendum)
It was a pleasure meeting you today. Thank you for allowing me to take part in your health care.  Our goals for today as we discussed include:  Schedule Medicare Annual Wellness Visit    Continue Lasix 40 mg daily Continue Potassium 10 mEq daily Checking blood work today  Continue Semaglutide 0.25 mg weekly.   Follow up with Cardiology for BP  Follow up with Endocrinology   If you have any questions or concerns, please do not hesitate to call the office at [phone].  I look forward to our next visit and until then take care and stay safe.  Regards,   Dana Allan, MD   [facility]

## 2022-04-23 ENCOUNTER — Encounter: Payer: Self-pay | Admitting: Family Medicine

## 2022-04-23 ENCOUNTER — Ambulatory Visit (HOSPITAL_COMMUNITY)
Admission: RE | Admit: 2022-04-23 | Discharge: 2022-04-23 | Disposition: A | Discharge status: Home / Self Care | Payer: Medicare Other | Source: Ambulatory Visit | Attending: Family Medicine | Admitting: Family Medicine

## 2022-04-23 DIAGNOSIS — Z79899 Other long term (current) drug therapy: Secondary | ICD-10-CM | POA: Insufficient documentation

## 2022-04-23 DIAGNOSIS — N182 Chronic kidney disease, stage 2 (mild): Secondary | ICD-10-CM | POA: Diagnosis present

## 2022-04-23 DIAGNOSIS — E1122 Type 2 diabetes mellitus with diabetic chronic kidney disease: Secondary | ICD-10-CM | POA: Diagnosis present

## 2022-04-23 DIAGNOSIS — N183 Chronic kidney disease, stage 3 unspecified: Secondary | ICD-10-CM | POA: Insufficient documentation

## 2022-04-23 LAB — BASIC METABOLIC PANEL
BUN: 26 mg/dL — ABNORMAL HIGH (ref 6–23)
CO2: 27 mEq/L (ref 19–32)
Calcium: 9.3 mg/dL (ref 8.4–10.5)
Chloride: 100 mEq/L (ref 96–112)
Creatinine, Ser: 1.22 mg/dL — ABNORMAL HIGH (ref 0.40–1.20)
GFR: 45 mL/min — ABNORMAL LOW (ref >60.00)
Glucose, Bld: 171 mg/dL — ABNORMAL HIGH (ref 70–99)
Potassium: 4.2 mEq/L (ref 3.5–5.1)
Sodium: 140 mEq/L (ref 135–145)

## 2022-04-23 MED ORDER — LANTUS SOLOSTAR 100 UNIT/ML ~~LOC~~ SOPN: PEN_INJECTOR | SUBCUTANEOUS | 2 refills | Status: DC

## 2022-04-23 NOTE — Assessment & Plan Note (Signed)
Loop diuretics initiated.  And currently on insulin recent dosing.  Add potassium 10 mEq daily

## 2022-04-23 NOTE — Assessment & Plan Note (Signed)
Improved since addition of twice daily Lasix.  Still having 2+ pitting edema bilaterally now up to mid thigh.  Is clear on exam.  Recent creatinine 1.01 with GFR 56.46. Decrease Lasix to 40 mg daily. Add potassium 20 mEq daily Plan for renal ultrasound Follow-up in 1 week.  Will repeat BMP at that time

## 2022-04-23 NOTE — Assessment & Plan Note (Addendum)
Chronic.  Not at goal.  Patient continues to decline new medications secondary to multiple sensitivities. Continue amlodipine 10 mg nightly Continue metoprolol XL 50 mg daily, could consider switching to carvedilol Follow-up with cardiology as scheduled

## 2022-04-23 NOTE — Assessment & Plan Note (Signed)
Suspect bilateral lower extremity edema secondary to uncontrolled hypertension and type II DM Continue Lasix 40 mg daily for lower extremity edema Renal ultrasound Follow-up in 1 week.  Consider nephrology consult.

## 2022-04-23 NOTE — Assessment & Plan Note (Addendum)
Chronic.  Uncontrolled.  Recent A1c increased from 10.2-10.9.  Reports compliancy with medications.  Had increased a.m. Lantus dose. Increase Lantus to 35 units a.m. and 27 units p.m. Continue Ozempic to 0.25 mg weekly, plan for increasing 0.5 mg if continues to tolerate On statin Blood pressure not well-controlled.  Declines ACEi/ARB Referred to endocrinology Follow-up in 1 week

## 2022-04-23 NOTE — Assessment & Plan Note (Signed)
Has improved with addition of diuretic and decreasing anasarca. Lung exam clear today, recent BNP 67 and chest x-ray did not indicate cardiology etiology. Recommend she follow-up with cardiology, currently on amiodarone. Recommend blood pressure control, diabetes control.

## 2022-04-26 ENCOUNTER — Ambulatory Visit (INDEPENDENT_AMBULATORY_CARE_PROVIDER_SITE_OTHER): Payer: Medicare Other | Admitting: Podiatry

## 2022-04-26 ENCOUNTER — Other Ambulatory Visit: Payer: Self-pay | Admitting: Family Medicine

## 2022-04-26 ENCOUNTER — Encounter: Payer: Self-pay | Admitting: Podiatry

## 2022-04-26 DIAGNOSIS — E11319 Type 2 diabetes mellitus with unspecified diabetic retinopathy without macular edema: Secondary | ICD-10-CM | POA: Insufficient documentation

## 2022-04-26 DIAGNOSIS — B351 Tinea unguium: Secondary | ICD-10-CM | POA: Diagnosis not present

## 2022-04-26 DIAGNOSIS — E1165 Type 2 diabetes mellitus with hyperglycemia: Secondary | ICD-10-CM

## 2022-04-26 DIAGNOSIS — M79675 Pain in left toe(s): Secondary | ICD-10-CM

## 2022-04-26 DIAGNOSIS — M79674 Pain in right toe(s): Secondary | ICD-10-CM

## 2022-04-26 NOTE — Progress Notes (Signed)
This patient presents to the office with chief complaint of long thick nails.   This patient  says there  is  no pain and discomfort in her feet.  This patient says there are long thick painful big toenails   These nails are painful walking and wearing shoes.  Patient has no history of infection or drainage from both feet.  Patient is unable to  self treat his own nails . This patient presents  to the office today for treatment of the  long nails.  Patient is diabetic.  General Appearance  Alert, conversant and in no acute stress.  Vascular  Dorsalis pedis and posterior tibial  pulses are palpable  bilaterally.  Capillary return is within normal limits  bilaterally. Temperature is within normal limits  bilaterally.  Neurologic  Senn-Weinstein monofilament wire test within normal limits  bilaterally. Muscle power within normal limits bilaterally.  Nails Thick disfigured discolored nails with subungual debris  hallux nails bilaterally. No evidence of bacterial infection or drainage bilaterally.  Orthopedic  No limitations of motion of motion feet .  No crepitus or effusions noted.  Mild  HAV  B/L.  Skin  normotropic skin with no porokeratosis noted bilaterally.  No signs of infections or ulcers noted.     Onychomycosis  Diabetes with no foot complications  Debride nails x 10.  A diabetic foot exam was performed and there is no evidence of any vascular or neurologic pathology.   RTC 12 weeks    Helane Gunther DPM

## 2022-04-28 ENCOUNTER — Other Ambulatory Visit: Payer: Self-pay | Admitting: Family Medicine

## 2022-04-28 DIAGNOSIS — Z1231 Encounter for screening mammogram for malignant neoplasm of breast: Secondary | ICD-10-CM

## 2022-05-01 ENCOUNTER — Other Ambulatory Visit: Payer: Self-pay | Admitting: Family Medicine

## 2022-05-01 ENCOUNTER — Telehealth: Payer: Self-pay | Admitting: Family Medicine

## 2022-05-01 ENCOUNTER — Encounter: Payer: Self-pay | Admitting: Family Medicine

## 2022-05-01 DIAGNOSIS — E1165 Type 2 diabetes mellitus with hyperglycemia: Secondary | ICD-10-CM

## 2022-05-01 MED ORDER — OZEMPIC (0.25 OR 0.5 MG/DOSE) 2 MG/1.5ML ~~LOC~~ SOPN: 0.5000 mg | PEN_INJECTOR | SUBCUTANEOUS | 0 refills | Status: DC

## 2022-05-01 NOTE — Progress Notes (Signed)
Increase Semaglutide to 0.5 mg weekly on 05/11/2022 Should have completed 4 weeks of the 0.25 mg weekly on 05/04/2022  Dana Allan, MD

## 2022-05-01 NOTE — Assessment & Plan Note (Signed)
Recent renal ultrasound normal.  No hydronephrosis. Continue Lasix 40 mg daily Continue potassium 10 mEq daily Bmet today Consider nephrology consult. Follow-up with results

## 2022-05-01 NOTE — Assessment & Plan Note (Signed)
Diabetic eye exam completed 04/26/22 Diabetic retinopathy noted

## 2022-05-01 NOTE — Assessment & Plan Note (Addendum)
Chronic.  Uncontrolled.  Recent A1c increased from 10.2-10.9.   Continue Lantus to 35 units a.m. and 20 units p.m. Continue Ozempic to 0.25 mg weekly, plan for increasing 0.5 mg if continues to tolerate On statin Blood pressure not well-controlled.  Declines ACEi/ARB Endocrinology appointment has been scheduled Follow-up in 4-5 weeks

## 2022-05-01 NOTE — Assessment & Plan Note (Signed)
Chronic.  Not at goal.  Patient continues to decline new medications secondary to multiple sensitivities.  Would like to start patient on ARB Continue amlodipine 10 mg nightly Continue metoprolol XL 50 mg daily, could consider switching to carvedilol Follow-up with cardiology as scheduled

## 2022-05-01 NOTE — Assessment & Plan Note (Signed)
Improved since addition of twice daily Lasix.  Bilateral lower extremity edema has decreased now to calf and 1+ pitting.  Lungs clear on exam.   Continue Lasix to 40 mg daily. Continue potassium 10 mEq daily Check labs

## 2022-05-03 NOTE — Telephone Encounter (Signed)
Called and informed pt of this information. She said she understood and would call back if she had any further questions.

## 2022-05-04 ENCOUNTER — Other Ambulatory Visit: Payer: Self-pay

## 2022-05-04 DIAGNOSIS — I4891 Unspecified atrial fibrillation: Secondary | ICD-10-CM

## 2022-05-04 DIAGNOSIS — E1165 Type 2 diabetes mellitus with hyperglycemia: Secondary | ICD-10-CM

## 2022-05-04 MED ORDER — APIXABAN 5 MG PO TABS: ORAL_TABLET | ORAL | 0 refills | Status: DC

## 2022-05-04 MED ORDER — FREESTYLE LIBRE 3 SENSOR MISC: 5 refills | Status: DC

## 2022-05-04 NOTE — Telephone Encounter (Signed)
Called pt to let her know that I've completed her Medicaid Transportation Exception Verification form and faxed to [phone] as requested.  While on the phone pt reported that she needed refills for Eliquis and Dr. Clent Ridges wanted her to have Freestyle Libre 3.  She reported that her iPhone is coming in today.  She would like both sent to [facility]. Prescriptions pended for Dr. Claris Che signature.

## 2022-05-06 ENCOUNTER — Telehealth: Payer: Self-pay | Admitting: Family Medicine

## 2022-05-06 ENCOUNTER — Encounter: Payer: Self-pay | Admitting: Nurse Practitioner

## 2022-05-06 NOTE — Telephone Encounter (Signed)
Contacted [name] [name] to schedule their annual wellness visit. Appointment made for 05/14/2022.  Thank you,  [facility] Support [facility] Direct dial  [phone]

## 2022-05-13 ENCOUNTER — Emergency Department (HOSPITAL_COMMUNITY)
Admission: EM | Admit: 2022-05-13 | Discharge: 2022-05-14 | Discharge status: Against Medical Advice | Payer: Medicare Other | Attending: Emergency Medicine | Admitting: Emergency Medicine

## 2022-05-13 ENCOUNTER — Encounter (HOSPITAL_COMMUNITY): Payer: Self-pay | Admitting: Emergency Medicine

## 2022-05-13 ENCOUNTER — Telehealth: Payer: Self-pay

## 2022-05-13 ENCOUNTER — Emergency Department (HOSPITAL_COMMUNITY): Payer: Medicare Other

## 2022-05-13 DIAGNOSIS — I509 Heart failure, unspecified: Secondary | ICD-10-CM | POA: Diagnosis not present

## 2022-05-13 DIAGNOSIS — I110 Hypertensive heart disease with heart failure: Secondary | ICD-10-CM | POA: Insufficient documentation

## 2022-05-13 DIAGNOSIS — Z5321 Procedure and treatment not carried out due to patient leaving prior to being seen by health care provider: Secondary | ICD-10-CM | POA: Insufficient documentation

## 2022-05-13 DIAGNOSIS — R0602 Shortness of breath: Secondary | ICD-10-CM | POA: Insufficient documentation

## 2022-05-13 DIAGNOSIS — E119 Type 2 diabetes mellitus without complications: Secondary | ICD-10-CM | POA: Insufficient documentation

## 2022-05-13 LAB — CBC WITH DIFFERENTIAL/PLATELET
Abs Immature Granulocytes: 0.03 10*3/uL (ref 0.00–0.07)
Basophils Absolute: 0.1 10*3/uL (ref 0.0–0.1)
Basophils Relative: 1 %
Eosinophils Absolute: 0.3 10*3/uL (ref 0.0–0.5)
Eosinophils Relative: 4 %
HCT: 43.1 % (ref 36.0–46.0)
Hemoglobin: 13.2 g/dL (ref 12.0–15.0)
Immature Granulocytes: 0 %
Lymphocytes Relative: 37 %
Lymphs Abs: 3.5 10*3/uL (ref 0.7–4.0)
MCH: 24.4 pg — ABNORMAL LOW (ref 26.0–34.0)
MCHC: 30.6 g/dL (ref 30.0–36.0)
MCV: 79.7 fL — ABNORMAL LOW (ref 80.0–100.0)
Monocytes Absolute: 0.8 10*3/uL (ref 0.1–1.0)
Monocytes Relative: 8 %
Neutro Abs: 4.8 10*3/uL (ref 1.7–7.7)
Neutrophils Relative %: 50 %
Platelets: 242 10*3/uL (ref 150–400)
RBC: 5.41 MIL/uL — ABNORMAL HIGH (ref 3.87–5.11)
RDW: 15.4 % (ref 11.5–15.5)
WBC: 9.6 10*3/uL (ref 4.0–10.5)
nRBC: 0 % (ref 0.0–0.2)

## 2022-05-13 LAB — TROPONIN I (HIGH SENSITIVITY)
Troponin I (High Sensitivity): 12 ng/L (ref <18)
Troponin I (High Sensitivity): 12 ng/L (ref <18)

## 2022-05-13 LAB — COMPREHENSIVE METABOLIC PANEL
ALT: 19 U/L (ref 0–44)
AST: 16 U/L (ref 15–41)
Albumin: 2.9 g/dL — ABNORMAL LOW (ref 3.5–5.0)
Alkaline Phosphatase: 96 U/L (ref 38–126)
Anion gap: 9 (ref 5–15)
BUN: 26 mg/dL — ABNORMAL HIGH (ref 8–23)
CO2: 25 mmol/L (ref 22–32)
Calcium: 8.9 mg/dL (ref 8.9–10.3)
Chloride: 103 mmol/L (ref 98–111)
Creatinine, Ser: 1.19 mg/dL — ABNORMAL HIGH (ref 0.44–1.00)
GFR, Estimated: 49 mL/min — ABNORMAL LOW (ref >60)
Glucose, Bld: 105 mg/dL — ABNORMAL HIGH (ref 70–99)
Potassium: 3.6 mmol/L (ref 3.5–5.1)
Sodium: 137 mmol/L (ref 135–145)
Total Bilirubin: 0.4 mg/dL (ref 0.3–1.2)
Total Protein: 7.1 g/dL (ref 6.5–8.1)

## 2022-05-13 LAB — BRAIN NATRIURETIC PEPTIDE: B Natriuretic Peptide: 91.2 pg/mL (ref 0.0–100.0)

## 2022-05-13 IMAGING — DX DG CHEST 1V PORT
1 series · 1 of 1 positions shown · non-contrast
Comparison: 09/11/2013

CLINICAL DATA: Atrial fibrillation

EXAM:
PORTABLE CHEST 1 VIEW

[chest]
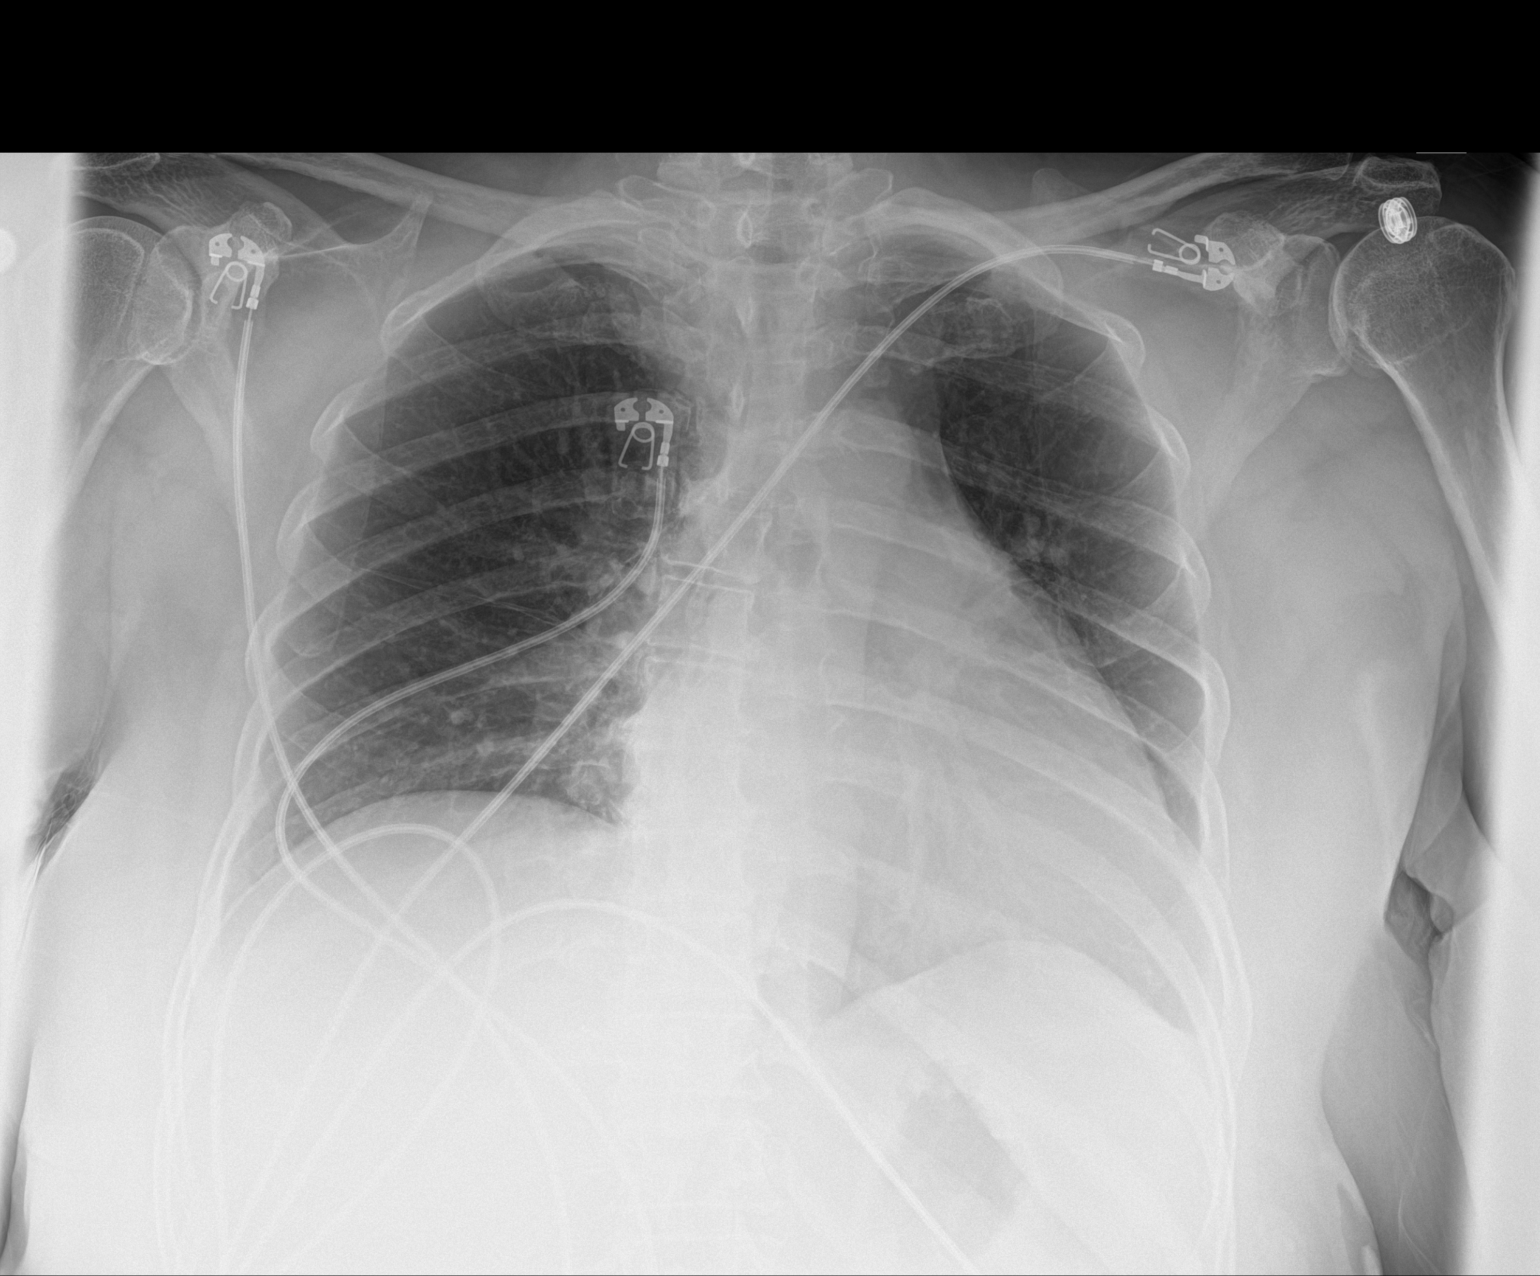

[1 of 1 positions shown; findings below may reference images not displayed]

FINDINGS: Mild cardiomegaly. No focal airspace consolidation, pleural
effusion, or pneumothorax. Osseous structures are within normal
limits.
IMPRESSION: No active disease.

## 2022-05-13 NOTE — Telephone Encounter (Signed)
Called patient to check if went to ED for above.  She reports feeling better.  Reports having a bruise on head. Doesn't know how got there.  No LOC, or fall. Reports feeling fatigued for the past 3 days.  Denies chest pain, abdominal pain, diarrhea, dysuria, any lower extremity swelling.  Exertional SOB, increased HR.   Recommend she go the Emergency room for further evaluation given significant history of PE, HFrEF, Afib on Eliquis, DM Type 2, HTN, CKD.   Patient stated that she would call her daughter to see if they would take her to ED.  Also stated that her son comes to check on her as she lives alone.

## 2022-05-13 NOTE — Patient Instructions (Signed)

## 2022-05-13 NOTE — Progress Notes (Signed)
I connected with  [name] on 05/14/2022 by a audio enabled telemedicine application and verified that I am speaking with the correct person using two identifiers.  Patient Location: Home  Provider Location: Home Office  I discussed the limitations of evaluation and management by telemedicine. The patient expressed understanding and agreed to proceed.  Subjective:   [name] [name] is a 72 y.o. female who presents for Medicare Annual (Subsequent) preventive examination.  Review of Systems    Per HPI unless specifically indicated below. Cardiac Risk Factors include: advanced age (>55men, >64 women);female gender, Hypertension, CHF, and Hyperlipidemia           Objective:       05/14/2022    8:24 AM 05/13/2022   10:41 PM 05/13/2022    7:21 PM  Vitals with BMI  Systolic 111 165 161  Diastolic 70 97 101  Pulse  120 118    Today's Vitals   05/14/22 0824  BP: 111/70   There is no height or weight on file to calculate BMI.     05/14/2022    8:42 AM 02/05/2022   10:37 PM 12/18/2021   10:20 PM 07/03/2021   10:23 AM 06/17/2021   10:46 AM 06/09/2021    3:51 PM 05/25/2021    3:08 PM  Advanced Directives  Does Patient Have a Medical Advance Directive? No No No No No No No  Would patient like information on creating a medical advance directive?    No - Patient declined No - Patient declined No - Patient declined No - Patient declined    Current Medications (verified) Outpatient Encounter Medications as of 05/14/2022  Medication Sig   acetaminophen (TYLENOL) 650 MG CR tablet Take 1 tablet (650 mg total) by mouth every 8 (eight) hours as needed for pain.   amLODipine (NORVASC) 10 MG tablet Take 1 tablet (10 mg total) by mouth at bedtime.   apixaban (ELIQUIS) 5 MG TABS tablet TAKE 1 TABLET(5 MG) BY MOUTH TWICE DAILY   atorvastatin (LIPITOR) 80 MG tablet TAKE 1 TABLET(80 MG) BY MOUTH DAILY   blood glucose meter kit and supplies Dispense based on patient and  insurance preference. Use up to four times daily as directed. (FOR ICD-10 E10.9, E11.9).   Continuous Blood Gluc Sensor (FREESTYLE LIBRE 2 SENSOR) MISC Apply 1 to skin every 14 days   Continuous Glucose Sensor (FREESTYLE LIBRE 3 SENSOR) MISC Place 1 sensor on the skin every 14 days. Use to check glucose continuously   fluticasone (FLONASE) 50 MCG/ACT nasal spray 2 sprays Once Daily.   furosemide (LASIX) 40 MG tablet Take 1 tablet (40 mg total) by mouth daily.   insulin glargine (LANTUS SOLOSTAR) 100 UNIT/ML Solostar Pen ADMINISTER 35 UNITS UNDER THE SKIN in am and 20 units at night   Insulin Pen Needle (PEN NEEDLES) 32G X 6 MM MISC 32G x 6 MM pen needles   magnesium oxide (MAG-OX) 400 MG tablet Take by mouth.   metoprolol succinate (TOPROL XL) 50 MG 24 hr tablet Take 1 tablet (50 mg total) by mouth daily. Take with or immediately following a meal.   pantoprazole (PROTONIX) 40 MG tablet TAKE 1 TABLET(40 MG) BY MOUTH DAILY   potassium chloride (KLOR-CON) 10 MEQ tablet Take 2 tablets (20 mEq total) by mouth daily.   Semaglutide,0.25 or 0.5MG /DOS, (OZEMPIC, 0.25 OR 0.5 MG/DOSE,) 2 MG/1.5ML SOPN Inject 0.5 mg into the skin once a week.   amiodarone (PACERONE) 200 MG tablet Take 1 tablet (  200 mg total) by mouth daily.   No facility-administered encounter medications on file as of 05/14/2022.    Allergies (verified) Food, Pacerone [amiodarone], Peanut (diagnostic), Toprol xl [metoprolol], Asa [aspirin], and Lactose intolerance (gi)   History: Past Medical History:  Diagnosis Date   Abdominal pain    Abnormal chest x-ray 02/08/2021   Acute respiratory failure with hypoxia (HCC)    Arthritis    Atrial flutter (HCC)    Atrial tachycardia 07/29/2019   Back pain    Back pain 11/15/2014   Bilateral leg edema 03/18/2020   Blood transfusion without reported diagnosis    Chronic anticoagulation - on Eliquis for chronic afib 02/04/2020   Chronic combined systolic and diastolic CHF (congestive heart  failure) (HCC) 02/04/2020   Chronic heart failure with preserved ejection fraction (HFpEF) (HCC) 03/18/2020   LVEF less than 20% (May 2022), LVEF 55 to 60% (September 2022)   Constipation    Contact dermatitis 08/12/2020   COVID-19 02/04/2020   Diabetes mellitus without complication (HCC)    Diabetes mellitus without complication (HCC)    Dysphagia 02/03/2020   Epigastric pain 02/03/2020   Fatigue    Fatigue    Full dentures    Full dentures    GERD (gastroesophageal reflux disease)    Hemorrhoids    History of esophageal stricture 07/29/2019   History of noncompliance with medical treatment, presenting hazards to health    Hospital discharge follow-up 07/27/2020   Hyperlipidemia    Hypertension    Intertrigo 09/04/2020   Lack of adequate sleep    Lack of adequate sleep    Liver mass    Liver mass    Morbid obesity (HCC) 02/03/2020   Nonrheumatic tricuspid valve regurgitation 03/18/2020   Oral thrush 02/04/2020   Osteopenia after menopause 06/09/2017   PAF (paroxysmal atrial fibrillation) (HCC)    Pericardial effusion 05/12/2010   Pleural effusion 06/07/2020   Pleural effusion on left    QT prolongation    Rash    on right breast   Right upper quadrant abdominal pain    Stroke (cerebrum) (HCC) 05/12/2020   Uncontrolled type 2 diabetes mellitus with hyperglycemia (HCC) 06/08/2017   Wears glasses    Wears glasses    Past Surgical History:  Procedure Laterality Date   BREAST DUCTAL SYSTEM EXCISION     BREAST EXCISIONAL BIOPSY Left    CARDIAC SURGERY     CARDIOVERSION N/A 07/31/2019   Procedure: CARDIOVERSION;  Surgeon: Chrystie Nose, MD;  Location: [facility] ENDOSCOPY;  Service: Cardiovascular;  Laterality: N/A;   CARDIOVERSION N/A 05/19/2020   Procedure: CARDIOVERSION;  Surgeon: Dolores Patty, MD;  Location: [facility] ENDOSCOPY;  Service: Cardiovascular;  Laterality: N/A;   CESAREAN SECTION     CHOLECYSTECTOMY     CHOLECYSTECTOMY, LAPAROSCOPIC  07/24/2010   ESOPHAGEAL  DILATION     IR CT HEAD LTD  05/12/2020   IR PERCUTANEOUS ART THROMBECTOMY/INFUSION INTRACRANIAL INC DIAG ANGIO  05/12/2020       IR PERCUTANEOUS ART THROMBECTOMY/INFUSION INTRACRANIAL INC DIAG ANGIO  05/12/2020   IR US GUIDE VASC ACCESS RIGHT  05/12/2020   RADIOLOGY WITH ANESTHESIA N/A 05/12/2020   Procedure: IR WITH ANESTHESIA;  Surgeon: Radiologist, Medication, MD;  Location: [facility] OR;  Service: Radiology;  Laterality: N/A;   TEE WITHOUT CARDIOVERSION N/A 07/31/2019   Procedure: TRANSESOPHAGEAL ECHOCARDIOGRAM (TEE);  Surgeon: Chrystie Nose, MD;  Location: [facility] ENDOSCOPY;  Service: Cardiovascular;  Laterality: N/A;   TEE WITHOUT CARDIOVERSION N/A 05/19/2020   Procedure: TRANSESOPHAGEAL  ECHOCARDIOGRAM (TEE);  Surgeon: Dolores Patty, MD;  Location: [facility] ENDOSCOPY;  Service: Cardiovascular;  Laterality: N/A;   Family History  Problem Relation Age of Onset   Stroke Mother    Alcohol abuse Father    Other Father        broken heart after spouse spouse   Diabetes Sister    Hypertension Sister    Diabetes Sister    Multiple sclerosis Sister    Miscarriages / [place] Daughter    Drug abuse Daughter    Alcohol abuse Other    Diabetes Other    Breast cancer Neg Hx    Social History   Socioeconomic History   Marital status: Single    Spouse name: Not on file   Number of children: 3   Years of education: Not on file   Highest education level: High school graduate  Occupational History   Occupation: Retired    Associate Professor: OTHER    Comment: CNA  Tobacco Use   Smoking status: Never    Passive exposure: Never   Smokeless tobacco: Never  Vaping Use   Vaping Use: Never used  Substance and Sexual Activity   Alcohol use: No    Alcohol/week: 0.0 standard drinks of alcohol   Drug use: No   Sexual activity: Not Currently  Other Topics Concern   Not on file  Social History Narrative   Patient lives alone in an apartment complex for seniors.   Patient does not drive. She walks to  surrounding places near her complex or her children run errands for her. Patient never goes without.   Patient enjoys sewing, crafts, shopping, and walking.    Patient has 3 children and she is close with all of them.        Social Determinants of Health   Financial Resource Strain: Low Risk  (05/14/2022)   Overall Financial Resource Strain (CARDIA)    Difficulty of Paying Living Expenses: Not hard at all  Food Insecurity: No Food Insecurity (05/14/2022)   Hunger Vital Sign    Worried About Running Out of Food in the Last Year: Never true    Ran Out of Food in the Last Year: Never true  Transportation Needs: No Transportation Needs (05/14/2022)   PRAPARE - Administrator, Civil Service (Medical): No    Lack of Transportation (Non-Medical): No  Physical Activity: Insufficiently Active (05/14/2022)   Exercise Vital Sign    Days of Exercise per Week: 2 days    Minutes of Exercise per Session: 30 min  Stress: No Stress Concern Present (05/14/2022)   [employer] of Occupational Health - Occupational Stress Questionnaire    Feeling of Stress : Not at all  Social Connections: Socially Isolated (05/14/2022)   Social Connection and Isolation Panel [NHANES]    Frequency of Communication with Friends and Family: More than three times a week    Frequency of Social Gatherings with Friends and Family: Once a week    Attends Religious Services: Never    Database administrator or Organizations: No    Attends Engineer, structural: Never    Marital Status: Never married    Tobacco Counseling Counseling given: Not Answered   Clinical Intake:  Pre-visit preparation completed: No  Pain : No/denies pain Faces Pain Scale: No hurt  Faces Pain Scale: No hurt  Nutritional Status: BMI > 30  Obese Nutritional Risks: None Diabetes: Yes CBG done?: Yes CBG resulted in Enter/ Edit results?: Yes Did  pt. bring in CBG monitor from home?: No  How often do you need to have someone  help you when you read instructions, pamphlets, or other written materials from your doctor or pharmacy?: 1 - Never  Diabetic?Nutrition Risk Assessment:  Has the patient had any N/V/D within the last 2 months?  No  Does the patient have any non-healing wounds?  No  Has the patient had any unintentional weight loss or weight gain?  No   Diabetes:  Is the patient diabetic?  Yes  If diabetic, was a CBG obtained today?  Yes  Did the patient bring in their glucometer from home?  No  How often do you monitor your CBG's? Continuously .   Financial Strains and Diabetes Management:  Are you having any financial strains with the device, your supplies or your medication? No .  Does the patient want to be seen by Chronic Care Management for management of their diabetes?  No  Would the patient like to be referred to a Nutritionist or for Diabetic Management?  No   Diabetic Exams:  Diabetic Eye Exam: Completed 04/21/2022 Diabetic Foot Exam: Completed 06/09/2021   Interpreter Needed?: No  Information entered by :: Laurel Dimmer, CMA   Activities of Daily Living    05/14/2022    8:32 AM 12/20/2021    2:14 PM  In your present state of health, do you have any difficulty performing the following activities:  Hearing? 0   Vision? 1   Comment Blurry vision because cataract, Dr. Dione Booze   Difficulty concentrating or making decisions? 0   Walking or climbing stairs? 1   Dressing or bathing? 0   Doing errands, shopping? 1 0    Patient Care Team: Dana Allan, MD as PCP - General (Family Medicine) Thomasene Ripple, DO as PCP - Cardiology (Cardiology)  Indicate any recent Medical Services you may have received from other than [facility] providers in the past year (date may be approximate).     Assessment:   This is a routine wellness examination for [name] [name].   Hearing/Vision screen Denies any hearing issues. Denies any change to her vision. Wear glasses. Annual Eye Exam, Dr. Dione Booze   Dietary  issues and exercise activities discussed: Current Exercise Habits: Structured exercise class, Type of exercise: treadmill, Time (Minutes): 30, Frequency (Times/Week): 2, Weekly Exercise (Minutes/Week): 60, Intensity: Moderate, Exercise limited by: orthopedic condition(s)   Goals Addressed   None    Depression Screen    05/14/2022    8:29 AM 04/13/2022    1:41 PM 01/06/2022    3:05 PM 07/03/2021   10:23 AM 06/17/2021   10:51 AM 06/09/2021    3:51 PM 05/25/2021    3:08 PM  PHQ 2/9 Scores  PHQ - 2 Score 0 0 0 0 0 0 0  PHQ- 9 Score   0 0 0 0 0    Fall Risk    05/14/2022    8:31 AM 04/13/2022    1:41 PM 01/06/2022    3:05 PM 07/03/2021   10:23 AM 06/17/2021   10:51 AM  Fall Risk   Falls in the past year? 0 0 0 0 0  Number falls in past yr: 0 0 0 0 0  Injury with Fall? 0 0 0 1 0  Risk for fall due to : No Fall Risks No Fall Risks No Fall Risks Impaired balance/gait   Follow up Falls evaluation completed Falls evaluation completed Falls evaluation completed Falls prevention discussed     FALL  RISK PREVENTION PERTAINING TO THE HOME:  Any stairs in or around the home? Yes  If so, are there any without handrails? No  Home free of loose throw rugs in walkways, pet beds, electrical cords, etc? Yes  Adequate lighting in your home to reduce risk of falls? Yes   ASSISTIVE DEVICES UTILIZED TO PREVENT FALLS:  Life alert? Yes  Use of a cane, walker or w/c? Yes  Grab bars in the bathroom? Yes  Shower chair or bench in shower? Yes  Elevated toilet seat or a handicapped toilet? Yes   TIMED UP AND GO:  Was the test performed?Unable to perform, virtual appointment     Cognitive Function:        05/14/2022    8:35 AM 01/24/2019    1:35 PM  6CIT Screen  What Year? 0 points 0 points  What month? 0 points 0 points  What time? 0 points 0 points  Count back from 20 0 points 0 points  Months in reverse 0 points 0 points  Repeat phrase 0 points 0 points  Total Score 0 points 0 points     Immunizations Immunization History  Administered Date(s) Administered   Fluad Quad(high Dose 65+) 09/28/2019   Influenza,inj,Quad PF,6+ Mos 08/26/2016, 09/06/2017   Moderna Sars-Covid-2 Vaccination 07/10/2019, 08/07/2019   Pneumococcal Conjugate-13 05/13/2017   Pneumococcal Polysaccharide-23 11/09/2019   Tdap 05/13/2017   Zoster Recombinat (Shingrix) 05/16/2017, 07/16/2017    TDAP status: Up to date  Flu Vaccine status: Up to date  Pneumococcal vaccine status: Up to date  Covid-19 vaccine status: Information provided on how to obtain vaccines.   Qualifies for Shingles Vaccine? Yes   Zostavax completed Yes   Shingrix Completed?: Yes  Screening Tests Health Maintenance  Topic Date Due   COLONOSCOPY (Pts 45-67yrs Insurance coverage will need to be confirmed)  Never done   COVID-19 Vaccine (3 - Moderna risk series) 09/04/2019   Diabetic kidney evaluation - Urine ACR  06/10/2022   FOOT EXAM  06/10/2022   INFLUENZA VACCINE  08/12/2022   HEMOGLOBIN A1C  10/13/2022   MAMMOGRAM  03/25/2023   OPHTHALMOLOGY EXAM  04/21/2023   Diabetic kidney evaluation - eGFR measurement  05/13/2023   Medicare Annual Wellness (AWV)  05/14/2023   DTaP/Tdap/Td (2 - Td or Tdap) 05/14/2027   Pneumonia Vaccine 20+ Years old  Completed   DEXA SCAN  Completed   Hepatitis C Screening  Completed   Zoster Vaccines- Shingrix  Completed   HPV VACCINES  Aged Out    Health Maintenance  Health Maintenance Due  Topic Date Due   COLONOSCOPY (Pts 45-76yrs Insurance coverage will need to be confirmed)  Never done   COVID-19 Vaccine (3 - Moderna risk series) 09/04/2019   Diabetic kidney evaluation - Urine ACR  06/10/2022    Colorectal cancer screening: Referral to GI placed to [facility], the patient is awaiting a clearance from cardiology. Pt aware the office will call re: appt.  Mammogram status: Completed 03/24/2021. Repeat every year  DEXA Scan: 09/06/2017  Lung Cancer Screening:  (Low Dose CT Chest recommended if Age 62-80 years, 30 pack-year currently smoking OR have quit w/in 15years.) does not qualify.   Lung Cancer Screening Referral: not applicable   Additional Screening:  Hepatitis C Screening: does qualify; Completed 09/06/2017  Vision Screening: Recommended annual ophthalmology exams for early detection of glaucoma and other disorders of the eye. Is the patient up to date with their annual eye exam?  Yes  Who is the  provider or what is the name of the office in which the patient attends annual eye exams? Dr. Dione Booze If pt is not established with a provider, would they like to be referred to a provider to establish care? No .   Dental Screening: Recommended annual dental exams for proper oral hygiene  Community Resource Referral / Chronic Care Management: CRR required this visit?  No   CCM required this visit?  No      Plan:     I have personally reviewed and noted the following in the patient's chart:   Medical and social history Use of alcohol, tobacco or illicit drugs  Current medications and supplements including opioid prescriptions. Patient is not currently taking opioid prescriptions. Functional ability and status Nutritional status Physical activity Advanced directives List of other physicians Hospitalizations, surgeries, and ER visits in previous 12 months Vitals Screenings to include cognitive, depression, and falls Referrals and appointments  In addition, I have reviewed and discussed with patient certain preventive protocols, quality metrics, and best practice recommendations. A written personalized care plan for preventive services as well as general preventive health recommendations were provided to patient.    [name] [name] , Thank you for taking time to come for your Medicare Wellness Visit. I appreciate your ongoing commitment to your health goals. Please review the following plan we discussed and let me know if I can assist you  in the future.   These are the goals we discussed:  Goals      Patient Stated     Maintain lifestyle        This is a list of the screening recommended for you and due dates:  Health Maintenance  Topic Date Due   Colon Cancer Screening  Never done   COVID-19 Vaccine (3 - Moderna risk series) 09/04/2019   Yearly kidney health urinalysis for diabetes  06/10/2022   Complete foot exam   06/10/2022   Flu Shot  08/12/2022   Hemoglobin A1C  10/13/2022   Mammogram  03/25/2023   Eye exam for diabetics  04/21/2023   Yearly kidney function blood test for diabetes  05/13/2023   Medicare Annual Wellness Visit  05/14/2023   DTaP/Tdap/Td vaccine (2 - Td or Tdap) 05/14/2027   Pneumonia Vaccine  Completed   DEXA scan (bone density measurement)  Completed   Hepatitis C Screening: USPSTF Recommendation to screen - Ages 44-79 yo.  Completed   Zoster (Shingles) Vaccine  Completed   HPV Vaccine  Aged Out    [email or web address] [name] [name], [facility]   05/14/2022  Nurse Notes: Approximately 30 minute Non-Face -To-Face Medicare Wellness Visit ]

## 2022-05-13 NOTE — ED Triage Notes (Addendum)
Pt reports labile blood sugars, SOB, generalized weakness starting Monday- not able to get out of the bed for the past few days. Today she states she felt better and was able to wash dishes. She has bruise on side/top head that she doesn't know where it came from.Hx CHF- weighs self but not everyday so unsure if weight is up or not.

## 2022-05-13 NOTE — Telephone Encounter (Signed)
Spoke to pt and encouraged her to go to ED  after reading message below from pt daughter and get checked out just in case even though pt stated that she feels better today. Pt states that she has to get one of her kids to take her to Ed. I stated to pt that she really needs to go and that I would check back on her tomorrow to see how she feels

## 2022-05-13 NOTE — Telephone Encounter (Signed)
Patient's daughter, Mariel Craft, called to state patient's blood sugar was yesterday 229-239 and last night went down to 60.  [name] states patient's thing on her arm is going off.  [name] states patient is starting to breathe heavy and she was short of breath yesterday after 6pm.  [name] states patient's blood sugar went up to 110 this morning.  [name] states patient's breathing was heavy around 10am this morning.  [name] states she is not with patient, she is at work, but patient's granddaughter was with her earlier today, but she has left already.  [name] states patient is by herself right now, but her brother is supposed to be going to see patient at some point today.  [name] states patient has heart failure, and this causes her to have shortness of breath.  [name] states patient will not go to the hospital unless Dr. Dana Allan states she really needs to go.  I let [name] know that if she were with the patient, then we would let her speak with the triage nurse, but since the patient is alone, we cannot do that.  I let her know that patient needs to be seen by someone if she is having shortness of breath, even an Urgent Care or ED.  [name] states patient has seen a heart specialist in the past but she isn't sure who it is.  I looked up the cardiologist Rozell Searing Custovic, DO) and gave [name] the name of the practice and phone number [facility] - [phone]).  [name] states she will call patient's cariology office.  The patient's cardiologist is in [place] and patient lives in [place].

## 2022-05-13 NOTE — ED Notes (Signed)
Pt stated

## 2022-05-13 NOTE — ED Provider Triage Note (Signed)
Emergency Medicine Provider Triage Evaluation Note  [name] [name] , a 72 y.o. female  was evaluated in triage.  Pt complains of weakness.  Patient reports that she began experiencing some shortness of breath and weakness starting on Monday.  She does report a prior history of irregular heart rhythm, type 2 diabetes, hypertension, congestive heart failure, A-fib with RVR.  She reports that she feels that her heart may be beating slightly irregular at this time.  Denies any headache, congestion, abdominal pain, dysuria, hematuria.  Review of Systems  Positive: As above Negative: As above  Physical Exam  BP (!) 158/101   Pulse (!) 118   Temp 98.3 F (36.8 C) (Oral)   Resp 16   SpO2 100%  Gen:   Awake, no distress   Resp:  Normal effort MSK:   Moves extremities without difficulty  Other:  Patient is tachycardic  Medical Decision Making  Medically screening exam initiated at 7:31 PM.  Appropriate orders placed.  [name] [name] was informed that the remainder of the evaluation will be completed by another provider, this initial triage assessment does not replace that evaluation, and the importance of remaining in the ED until their evaluation is complete.     [name] Knudsen, PA-C 05/13/22 1932

## 2022-05-14 ENCOUNTER — Ambulatory Visit (INDEPENDENT_AMBULATORY_CARE_PROVIDER_SITE_OTHER): Payer: Medicare Other

## 2022-05-14 ENCOUNTER — Telehealth: Payer: Self-pay | Admitting: Family Medicine

## 2022-05-14 VITALS — BP 111/70

## 2022-05-14 DIAGNOSIS — I4891 Unspecified atrial fibrillation: Secondary | ICD-10-CM

## 2022-05-14 DIAGNOSIS — Z0000 Encounter for general adult medical examination without abnormal findings: Secondary | ICD-10-CM

## 2022-05-14 NOTE — ED Notes (Signed)
Patient called to go back to be seen by doctor, no response

## 2022-05-14 NOTE — Telephone Encounter (Signed)
Pt went to ED on 05/13/22

## 2022-05-14 NOTE — Telephone Encounter (Signed)
Noted thank you

## 2022-05-14 NOTE — Telephone Encounter (Signed)
Prescription Request  05/14/2022  LOV: 04/22/2022  What is the name of the medication or equipment? apixaban (ELIQUIS) 5 MG TABS tablet   Have you contacted your pharmacy to request a refill? Yes   Which pharmacy would you like this sent to?  [id number] - [place], [place] - [address] [place] [address] Phone: [phone] Fax: [phone]    Patient notified that their request is being sent to the clinical staff for review and that they should receive a response within 2 business days.   Please advise at Mobile [phone] (mobile)

## 2022-05-17 MED ORDER — APIXABAN 5 MG PO TABS
ORAL_TABLET | ORAL | 5 refills | Status: DC
Start: 2022-05-17 — End: 2022-06-03

## 2022-05-17 NOTE — Telephone Encounter (Signed)
Rx sent to pharmacy   

## 2022-05-17 NOTE — Addendum Note (Signed)
Addended by: Dorna Mai R on: 05/17/2022 10:50 AM   Modules accepted: Orders

## 2022-05-27 ENCOUNTER — Other Ambulatory Visit: Payer: Self-pay

## 2022-05-27 ENCOUNTER — Emergency Department (HOSPITAL_COMMUNITY): Payer: Medicare Other

## 2022-05-27 ENCOUNTER — Emergency Department (HOSPITAL_COMMUNITY)
Admission: EM | Admit: 2022-05-27 | Discharge: 2022-05-27 | Disposition: A | Discharge status: Home / Self Care | Payer: Medicare Other | Attending: Emergency Medicine | Admitting: Emergency Medicine

## 2022-05-27 ENCOUNTER — Ambulatory Visit (INDEPENDENT_AMBULATORY_CARE_PROVIDER_SITE_OTHER): Payer: Medicare Other | Admitting: Family Medicine

## 2022-05-27 ENCOUNTER — Encounter: Payer: Self-pay | Admitting: Family Medicine

## 2022-05-27 VITALS — BP 118/78 | HR 93 | Ht 63.0 in | Wt 201.0 lb

## 2022-05-27 DIAGNOSIS — N1831 Chronic kidney disease, stage 3a: Secondary | ICD-10-CM | POA: Diagnosis not present

## 2022-05-27 DIAGNOSIS — E1159 Type 2 diabetes mellitus with other circulatory complications: Secondary | ICD-10-CM

## 2022-05-27 DIAGNOSIS — Z8616 Personal history of COVID-19: Secondary | ICD-10-CM | POA: Insufficient documentation

## 2022-05-27 DIAGNOSIS — I110 Hypertensive heart disease with heart failure: Secondary | ICD-10-CM | POA: Diagnosis not present

## 2022-05-27 DIAGNOSIS — I5042 Chronic combined systolic (congestive) and diastolic (congestive) heart failure: Secondary | ICD-10-CM | POA: Insufficient documentation

## 2022-05-27 DIAGNOSIS — E119 Type 2 diabetes mellitus without complications: Secondary | ICD-10-CM | POA: Insufficient documentation

## 2022-05-27 DIAGNOSIS — E1169 Type 2 diabetes mellitus with other specified complication: Secondary | ICD-10-CM | POA: Diagnosis not present

## 2022-05-27 DIAGNOSIS — I509 Heart failure, unspecified: Secondary | ICD-10-CM | POA: Diagnosis not present

## 2022-05-27 DIAGNOSIS — E1165 Type 2 diabetes mellitus with hyperglycemia: Secondary | ICD-10-CM | POA: Diagnosis not present

## 2022-05-27 DIAGNOSIS — Z7901 Long term (current) use of anticoagulants: Secondary | ICD-10-CM | POA: Diagnosis not present

## 2022-05-27 DIAGNOSIS — E1122 Type 2 diabetes mellitus with diabetic chronic kidney disease: Secondary | ICD-10-CM

## 2022-05-27 DIAGNOSIS — I152 Hypertension secondary to endocrine disorders: Secondary | ICD-10-CM

## 2022-05-27 DIAGNOSIS — Z7985 Long-term (current) use of injectable non-insulin antidiabetic drugs: Secondary | ICD-10-CM

## 2022-05-27 DIAGNOSIS — I4891 Unspecified atrial fibrillation: Secondary | ICD-10-CM | POA: Diagnosis not present

## 2022-05-27 DIAGNOSIS — E785 Hyperlipidemia, unspecified: Secondary | ICD-10-CM

## 2022-05-27 DIAGNOSIS — Z794 Long term (current) use of insulin: Secondary | ICD-10-CM

## 2022-05-27 DIAGNOSIS — R000 Tachycardia, unspecified: Secondary | ICD-10-CM | POA: Diagnosis present

## 2022-05-27 LAB — URINALYSIS, ROUTINE W REFLEX MICROSCOPIC
Bacteria, UA: NONE SEEN
Bilirubin Urine: NEGATIVE
Glucose, UA: NEGATIVE mg/dL
Ketones, ur: NEGATIVE mg/dL
Leukocytes,Ua: NEGATIVE
Nitrite: NEGATIVE
Protein, ur: 30 mg/dL — AB
Specific Gravity, Urine: 1.005 (ref 1.005–1.030)
pH: 6 (ref 5.0–8.0)

## 2022-05-27 LAB — COMPREHENSIVE METABOLIC PANEL
ALT: 49 U/L — ABNORMAL HIGH (ref 0–44)
AST: 27 U/L (ref 15–41)
Albumin: 3.3 g/dL — ABNORMAL LOW (ref 3.5–5.0)
Alkaline Phosphatase: 104 U/L (ref 38–126)
Anion gap: 11 (ref 5–15)
BUN: 19 mg/dL (ref 8–23)
CO2: 19 mmol/L — ABNORMAL LOW (ref 22–32)
Calcium: 8.8 mg/dL — ABNORMAL LOW (ref 8.9–10.3)
Chloride: 103 mmol/L (ref 98–111)
Creatinine, Ser: 1.03 mg/dL — ABNORMAL HIGH (ref 0.44–1.00)
GFR, Estimated: 58 mL/min — ABNORMAL LOW (ref >60)
Glucose, Bld: 179 mg/dL — ABNORMAL HIGH (ref 70–99)
Potassium: 4.5 mmol/L (ref 3.5–5.1)
Sodium: 133 mmol/L — ABNORMAL LOW (ref 135–145)
Total Bilirubin: 0.5 mg/dL (ref 0.3–1.2)
Total Protein: 7.9 g/dL (ref 6.5–8.1)

## 2022-05-27 LAB — CBC
HCT: 43.4 % (ref 36.0–46.0)
Hemoglobin: 13.3 g/dL (ref 12.0–15.0)
MCH: 23.9 pg — ABNORMAL LOW (ref 26.0–34.0)
MCHC: 30.6 g/dL (ref 30.0–36.0)
MCV: 78.1 fL — ABNORMAL LOW (ref 80.0–100.0)
Platelets: 231 10*3/uL (ref 150–400)
RBC: 5.56 MIL/uL — ABNORMAL HIGH (ref 3.87–5.11)
RDW: 15.9 % — ABNORMAL HIGH (ref 11.5–15.5)
WBC: 10.3 10*3/uL (ref 4.0–10.5)
nRBC: 0 % (ref 0.0–0.2)

## 2022-05-27 LAB — PROTIME-INR
INR: 1.1 (ref 0.8–1.2)
Prothrombin Time: 13.9 seconds (ref 11.4–15.2)

## 2022-05-27 MED ORDER — DILTIAZEM HCL 25 MG/5ML IV SOLN
5.0000 mg | Freq: Once | INTRAVENOUS | Status: AC
  Administered 2022-05-27: 5 mg via INTRAVENOUS

## 2022-05-27 MED ORDER — ETOMIDATE 2 MG/ML IV SOLN
7.0000 mg | Freq: Once | INTRAVENOUS | Status: AC
  Administered 2022-05-27: 7 mg via INTRAVENOUS
  Filled 2022-05-27: qty 10

## 2022-05-27 MED ORDER — DILTIAZEM LOAD VIA INFUSION
20.0000 mg | Freq: Once | INTRAVENOUS | Status: AC
  Administered 2022-05-27: 20 mg via INTRAVENOUS
  Filled 2022-05-27: qty 20

## 2022-05-27 MED ORDER — DILTIAZEM HCL-DEXTROSE 125-5 MG/125ML-% IV SOLN (PREMIX)
INTRAVENOUS | Status: AC
  Administered 2022-05-27: 5 mg/h via INTRAVENOUS
  Filled 2022-05-27: qty 125

## 2022-05-27 MED ORDER — SODIUM CHLORIDE 0.9 % IV BOLUS
500.0000 mL | Freq: Once | INTRAVENOUS | Status: AC
  Administered 2022-05-27: 500 mL via INTRAVENOUS

## 2022-05-27 MED ORDER — DILTIAZEM HCL ER COATED BEADS 120 MG PO CP24
120.0000 mg | ORAL_CAPSULE | Freq: Once | ORAL | Status: AC
  Administered 2022-05-27: 120 mg via ORAL
  Filled 2022-05-27: qty 1

## 2022-05-27 MED ORDER — ETOMIDATE 2 MG/ML IV SOLN: 10.0000 mg | Freq: Once | INTRAVENOUS | Status: DC

## 2022-05-27 MED ORDER — CARVEDILOL 6.25 MG PO TABS: 6.2500 mg | ORAL_TABLET | Freq: Two times a day (BID) | ORAL | 0 refills | Status: DC

## 2022-05-27 MED ORDER — DILTIAZEM HCL-DEXTROSE 125-5 MG/125ML-% IV SOLN (PREMIX)
5.0000 mg/h | INTRAVENOUS | Status: DC
  Administered 2022-05-27: 5 mg/h via INTRAVENOUS

## 2022-05-27 MED ORDER — CARVEDILOL 3.125 MG PO TABS
6.2500 mg | ORAL_TABLET | Freq: Once | ORAL | Status: AC
  Administered 2022-05-27: 6.25 mg via ORAL
  Filled 2022-05-27: qty 2

## 2022-05-27 NOTE — Progress Notes (Signed)
RT called to bedside for a cardioversion. Pt tolerating procedure well, stable VS at this time.

## 2022-05-27 NOTE — ED Triage Notes (Signed)
Pt arrives to ED c/o a-fib AVR with rates to 170's per EMS. Pt states feeling unwell since yesterday and started feeling more fatigued today. Pt was given 10mg  of cardizem with EMS and rates lowered to 120's. Pt denies CP

## 2022-05-27 NOTE — ED Provider Notes (Addendum)
Patient handed off to me at 7 AM.  Question if she is converted back to sinus tachycardia after being in atrial flutter.  Dr. Joaquim Nam with cardiology has come to evaluate the patient and does think that this is still a atrial flutter.  Will go ahead with cardioversion which patient has consented to.  Will use etomidate.  Patient appeared to have gone back into atrial flutter with RVR.  Was restarted on diltiazem bolus and infusion while we awaited for cardiology.  But now recommended cardioversion.  Patient cardioverted back to normal sinus rhythm by Dr. Joaquim Nam while I performed sedation.  Patient will be started on Coreg twice daily and follow-up with cardiology outpatient.  .Sedation  Date/Time: 05/27/2022 8:25 AM  Performed by: Virgina Norfolk, DO Authorized by: Virgina Norfolk, DO   Consent:    Consent obtained:  Verbal and written   Consent given by:  Patient   Risks discussed:  Allergic reaction, dysrhythmia, inadequate sedation, nausea, prolonged hypoxia resulting in organ damage, prolonged sedation necessitating reversal, respiratory compromise necessitating ventilatory assistance and intubation and vomiting   Alternatives discussed:  Analgesia without sedation Universal protocol:    Procedure explained and questions answered to patient or proxy's satisfaction: yes     Relevant documents present and verified: yes     Immediately prior to procedure, a time out was called: yes     Patient identity confirmed:  Arm band and verbally with patient Indications:    Procedure performed:  Cardioversion Pre-sedation assessment:    Time since last food or drink:  6 hours   ASA classification: class 2 - patient with mild systemic disease     Mouth opening:  3 or more finger widths   Thyromental distance:  4 finger widths   Mallampati score:  II - soft palate, uvula, fauces visible   Neck mobility: normal     Pre-sedation assessments completed and reviewed: airway patency, cardiovascular  function, hydration status, mental status, nausea/vomiting, pain level, respiratory function and temperature     History of difficult intubation: yes     Pre-sedation assessment completed:  05/27/2022 8:26 AM Immediate pre-procedure details:    Reassessment: Patient reassessed immediately prior to procedure     Reviewed: vital signs, relevant labs/tests and NPO status     Verified: bag valve mask available, emergency equipment available, intubation equipment available, IV patency confirmed, oxygen available and reversal medications available   Procedure details (see MAR for exact dosages):    Preoxygenation:  Nasal cannula   Sedation:  Etomidate   Intended level of sedation: moderate (conscious sedation)   Intra-procedure monitoring:  Blood pressure monitoring, cardiac monitor, continuous capnometry, frequent LOC assessments, frequent vital sign checks and continuous pulse oximetry   Intra-procedure events: none     Total Provider sedation time (minutes):  20 Post-procedure details:    Post-sedation assessment completed:  05/27/2022 9:10 AM   Attendance: Constant attendance by certified staff until patient recovered     Recovery: Patient returned to pre-procedure baseline     Post-sedation assessments completed and reviewed: airway patency, cardiovascular function, hydration status, mental status, nausea/vomiting, pain level, respiratory function and temperature     Patient is stable for discharge or admission: yes     Procedure completion:  Tolerated well, no immediate complications .Critical Care  Performed by: Virgina Norfolk, DO Authorized by: Virgina Norfolk, DO   Critical care provider statement:    Critical care time (minutes):  35   Critical care was necessary to treat or prevent  imminent or life-threatening deterioration of the following conditions: atrial flutter with rvr.   Critical care was time spent personally by me on the following activities:  Blood draw for specimens,  development of treatment plan with patient or surrogate, discussions with primary provider, evaluation of patient's response to treatment, examination of patient, obtaining history from patient or surrogate, ordering and performing treatments and interventions, ordering and review of laboratory studies, ordering and review of radiographic studies, pulse oximetry, re-evaluation of patient's condition and review of old charts       Virgina Norfolk, DO 05/27/22 0910    Virgina Norfolk, DO 05/27/22 0910    Virgina Norfolk, DO 06/20/22 1629

## 2022-05-27 NOTE — Consult Note (Addendum)
CARDIOLOGY CONSULT NOTE  Patient ID: [name] [name] MRN: [id number] DOB/AGE: 1950/03/30 72 y.o.  Admit date: 05/27/2022 Referring Physician: [facility] Reason for Consultation:  Afib  HPI:   72 y.o. African American female  with hypertension, type 2 DM, paroxysmal Afib/flutter  Patient presented to [facility] ER this morning with two days of generalized weakness. She denies chest pain, shortness of breath, palpitations, leg edema, orthopnea, PND, TIA/syncope. She has known PAF< last cardioverted in the ER in 01/2022. She has been compliant with Eliquis. She was in the ER two weeks ago with somewhat similar complaints. EKG then showed narrow complex tachycardia in 110s that was thought to be sinus tachycardia. On personal review, it appears similar to today's EKG with most likely atypical atrial flutter with 2:1 AV conduction. CXR today does not show any large pericardial/pleural effusion.    Past Medical History:  Diagnosis Date   Abdominal pain    Abnormal chest x-ray 02/08/2021   Acute respiratory failure with hypoxia (HCC)    Arthritis    Atrial flutter (HCC)    Atrial tachycardia 07/29/2019   Back pain    Back pain 11/15/2014   Bilateral leg edema 03/18/2020   Blood transfusion without reported diagnosis    Chronic anticoagulation - on Eliquis for chronic afib 02/04/2020   Chronic combined systolic and diastolic CHF (congestive heart failure) (HCC) 02/04/2020   Chronic heart failure with preserved ejection fraction (HFpEF) (HCC) 03/18/2020   LVEF less than 20% (May 2022), LVEF 55 to 60% (September 2022)   Constipation    Contact dermatitis 08/12/2020   COVID-19 02/04/2020   Diabetes mellitus without complication (HCC)    Diabetes mellitus without complication (HCC)    Dysphagia 02/03/2020   Epigastric pain 02/03/2020   Fatigue    Fatigue    Full dentures    Full dentures    GERD (gastroesophageal reflux disease)    Hemorrhoids    History of esophageal  stricture 07/29/2019   History of noncompliance with medical treatment, presenting hazards to health    Hospital discharge follow-up 07/27/2020   Hyperlipidemia    Hypertension    Intertrigo 09/04/2020   Lack of adequate sleep    Lack of adequate sleep    Liver mass    Liver mass    Morbid obesity (HCC) 02/03/2020   Nonrheumatic tricuspid valve regurgitation 03/18/2020   Oral thrush 02/04/2020   Osteopenia after menopause 06/09/2017   PAF (paroxysmal atrial fibrillation) (HCC)    Pericardial effusion 05/12/2010   Pleural effusion 06/07/2020   Pleural effusion on left    QT prolongation    Rash    on right breast   Right upper quadrant abdominal pain    Stroke (cerebrum) (HCC) 05/12/2020   Uncontrolled type 2 diabetes mellitus with hyperglycemia (HCC) 06/08/2017   Wears glasses    Wears glasses      Past Surgical History:  Procedure Laterality Date   BREAST DUCTAL SYSTEM EXCISION     BREAST EXCISIONAL BIOPSY Left    CARDIAC SURGERY     CARDIOVERSION N/A 07/31/2019   Procedure: CARDIOVERSION;  Surgeon: Chrystie Nose, MD;  Location: [facility] ENDOSCOPY;  Service: Cardiovascular;  Laterality: N/A;   CARDIOVERSION N/A 05/19/2020   Procedure: CARDIOVERSION;  Surgeon: Dolores Patty, MD;  Location: [facility] ENDOSCOPY;  Service: Cardiovascular;  Laterality: N/A;   CESAREAN SECTION     CHOLECYSTECTOMY     CHOLECYSTECTOMY, LAPAROSCOPIC  07/24/2010   ESOPHAGEAL DILATION     IR  CT HEAD LTD  05/12/2020   IR PERCUTANEOUS ART THROMBECTOMY/INFUSION INTRACRANIAL INC DIAG ANGIO  05/12/2020       IR PERCUTANEOUS ART THROMBECTOMY/INFUSION INTRACRANIAL INC DIAG ANGIO  05/12/2020   IR US GUIDE VASC ACCESS RIGHT  05/12/2020   RADIOLOGY WITH ANESTHESIA N/A 05/12/2020   Procedure: IR WITH ANESTHESIA;  Surgeon: Radiologist, Medication, MD;  Location: [facility] OR;  Service: Radiology;  Laterality: N/A;   TEE WITHOUT CARDIOVERSION N/A 07/31/2019   Procedure: TRANSESOPHAGEAL ECHOCARDIOGRAM (TEE);  Surgeon:  Chrystie Nose, MD;  Location: [facility] ENDOSCOPY;  Service: Cardiovascular;  Laterality: N/A;   TEE WITHOUT CARDIOVERSION N/A 05/19/2020   Procedure: TRANSESOPHAGEAL ECHOCARDIOGRAM (TEE);  Surgeon: Dolores Patty, MD;  Location: [facility] ENDOSCOPY;  Service: Cardiovascular;  Laterality: N/A;      Family History  Problem Relation Age of Onset   Stroke Mother    Alcohol abuse Father    Other Father        broken heart after spouse spouse   Diabetes Sister    Hypertension Sister    Diabetes Sister    Multiple sclerosis Sister    Miscarriages / [place] Daughter    Drug abuse Daughter    Alcohol abuse Other    Diabetes Other    Breast cancer Neg Hx      Social History: Social History   Socioeconomic History   Marital status: Single    Spouse name: Not on file   Number of children: 3   Years of education: Not on file   Highest education level: High school graduate  Occupational History   Occupation: Retired    Associate Professor: OTHER    Comment: CNA  Tobacco Use   Smoking status: Never    Passive exposure: Never   Smokeless tobacco: Never  Vaping Use   Vaping Use: Never used  Substance and Sexual Activity   Alcohol use: No    Alcohol/week: 0.0 standard drinks of alcohol   Drug use: No   Sexual activity: Not Currently  Other Topics Concern   Not on file  Social History Narrative   Patient lives alone in an apartment complex for seniors.   Patient does not drive. She walks to surrounding places near her complex or her children run errands for her. Patient never goes without.   Patient enjoys sewing, crafts, shopping, and walking.    Patient has 3 children and she is close with all of them.        Social Determinants of Health   Financial Resource Strain: Low Risk  (05/14/2022)   Overall Financial Resource Strain (CARDIA)    Difficulty of Paying Living Expenses: Not hard at all  Food Insecurity: No Food Insecurity (05/14/2022)   Hunger Vital Sign    Worried About Running Out  of Food in the Last Year: Never true    Ran Out of Food in the Last Year: Never true  Transportation Needs: No Transportation Needs (05/14/2022)   PRAPARE - Administrator, Civil Service (Medical): No    Lack of Transportation (Non-Medical): No  Physical Activity: Insufficiently Active (05/14/2022)   Exercise Vital Sign    Days of Exercise per Week: 2 days    Minutes of Exercise per Session: 30 min  Stress: No Stress Concern Present (05/14/2022)   [employer] of Occupational Health - Occupational Stress Questionnaire    Feeling of Stress : Not at all  Social Connections: Socially Isolated (05/14/2022)   Social Connection and Isolation Panel [NHANES]  Frequency of Communication with Friends and Family: More than three times a week    Frequency of Social Gatherings with Friends and Family: Once a week    Attends Religious Services: Never    Database administrator or Organizations: No    Attends Banker Meetings: Never    Marital Status: Never married  Intimate Partner Violence: Not At Risk (05/14/2022)   Humiliation, Afraid, Rape, and Kick questionnaire    Fear of Current or Ex-Partner: No    Emotionally Abused: No    Physically Abused: No    Sexually Abused: No     (Not in a hospital admission)   Review of Systems  Constitutional: Positive for malaise/fatigue.  Cardiovascular:  Negative for chest pain, dyspnea on exertion, leg swelling, palpitations and syncope.      Physical Exam: Physical Exam Vitals and nursing note reviewed.  Constitutional:      General: She is not in acute distress. Neck:     Vascular: No JVD.  Cardiovascular:     Rate and Rhythm: Tachycardia present. Rhythm irregular.     Heart sounds: Normal heart sounds. No murmur heard. Pulmonary:     Effort: Pulmonary effort is normal.     Breath sounds: Normal breath sounds. No wheezing or rales.  Musculoskeletal:     Right lower leg: No edema.     Left lower leg: No edema.         Imaging/tests reviewed and independently interpreted: Lab Results: CBC, BMP, trop HS  Cardiac Studies: Telemetry 05/27/2022: Afib/atypical atrial flutter  EKG 05/27/2022: Afib/atypical atrial flutter with RVR  Echocardiogram 12/19/2021:  1. Left ventricular ejection fraction, by estimation, is 55 to 60%. The  left ventricle has normal function. The left ventricle has no regional  wall motion abnormalities. There is moderate concentric left ventricular  hypertrophy. Left ventricular  diastolic parameters are consistent with Grade III diastolic dysfunction  (restrictive).   2. Right ventricular systolic function is normal. The right ventricular  size is normal. Tricuspid regurgitation signal is inadequate for assessing  PA pressure.   3. A small pericardial effusion is present. There is no evidence of  cardiac tamponade.   4. The mitral valve is grossly normal. Trivial mitral valve  regurgitation. No evidence of mitral stenosis.   5. The aortic valve is tricuspid. Aortic valve regurgitation is not  visualized. No aortic stenosis is present.   6. There is mild dilatation of the aortic root, measuring 41 mm.   7. The inferior vena cava is normal in size with greater than 50%  respiratory variability, suggesting right atrial pressure of 3 mmHg.   Comparison(s): No significant change from prior study.   Assessment & Recommendations:  72 y.o. African American female  with hypertension, type 2 DM, paroxysmal Afib/flutter  Afib/flutter: While EKG could be misleading appearing like sinus tachycardia, I believe she has atypical flutter.fib with RVR. She has likely been in this rhythm for at least two weeks. She has been compliant with Eliquis. Given poorly controlled rate, I recommend cardioversion in the ER. After restoration of sinus rhythm, she can be discharged home on continued Eliquis 5 mg bid. It appears that she had some rash with metoprolol, and was not taking either  metoprolol or amiodarone. We can discharge her home on carvedilol 6.25 mg bid. We will arrange outpatient follow up and further discuss rhythm control options with AAD vs ablation.   Discussed interpretation of tests and management recommendations with the primary team  Nigel Mormon, MD Pager: [phone] Office: [phone]

## 2022-05-27 NOTE — Progress Notes (Signed)
SUBJECTIVE:   Chief Complaint  Patient presents with   Medical Management of Chronic Issues   Atrial Fibrillation    Pt seen in ED today, had to be cardioverted into NSR, discharged with cardiology follow up next Tues   Referral    Would like CNA for assistance with ADLs   HPI Patient presents to clinic for follow-up chronic disease management  A-fib Cardioverted earlier today at [facility], ED.  She reports feeling fatigued but doing well.  Denies any chest pain or heart palpitations.  Has follow-up with cardiologist 05/23.  DM type II Tolerating Ozempic 0.5 milligrams weekly.  Continues to take Lantus 35 units a.m. and 10 units at night.  Denies any hypoglycemic events.  Requesting assistance for home aid.  Lives alone. Having difficulty with completing ADL's given increasing SOB and current cardiac issues. Requires assistance with ambulation, walker.    PERTINENT PMH / PSH: A-fib DM type II Hypertension Hyperlipidemia   OBJECTIVE:  BP 118/78   Pulse 93   Ht 5\' 3"  (1.6 m)   Wt 201 lb (91.2 kg)   SpO2 96%   BMI 35.61 kg/m    Physical Exam Vitals reviewed.  Constitutional:      General: She is not in acute distress.    Appearance: She is obese. She is ill-appearing. She is not toxic-appearing or diaphoretic.  Eyes:     General:        Right eye: No discharge.        Left eye: No discharge.     Conjunctiva/sclera: Conjunctivae normal.  Cardiovascular:     Rate and Rhythm: Normal rate and regular rhythm.     Pulses: Normal pulses.     Heart sounds: Normal heart sounds.  Pulmonary:     Effort: Pulmonary effort is normal.     Breath sounds: Normal breath sounds.  Abdominal:     General: Bowel sounds are normal.  Musculoskeletal:        General: Normal range of motion.  Skin:    General: Skin is warm and dry.  Neurological:     General: No focal deficit present.     Mental Status: She is alert and oriented to person, place, and time. Mental status is at  baseline.  Psychiatric:        Mood and Affect: Mood normal.        Behavior: Behavior normal.        Thought Content: Thought content normal.        Judgment: Judgment normal.     ASSESSMENT/PLAN:  Type 2 diabetes mellitus with hyperglycemia, unspecified whether long term insulin use (HCC) Assessment & Plan: Chronic. No hypoglycemic events.  Tolerating Ozemoic at current dose Continue Lantus to 35 units a.m. and 10 units p.m. Continue Ozempic to 0.5 mg weekly, plan for increasing 1 mg in 4 weeks On statin Blood pressure not well-controlled.  Declines ACEi/ARB Endocrinology appointment has been scheduled Follow up in 2 weeks  Orders: -     AMB Referral to [facility] Coordinaton (ACO Patients)  Hypertension associated with diabetes (HCC) -     AMB Referral to [facility] ([facility] Patients)  Stage 3a chronic kidney disease (HCC) -     AMB Referral to [facility] ([facility] Patients)  Hyperlipidemia associated with type 2 diabetes mellitus (HCC) -     AMB Referral to [facility] ([facility] Patients)  CKD stage 2 due to type 2 diabetes mellitus (HCC)  Atrial fibrillation  with RVR [facility]) Assessment & Plan: Status post cardioversion today.  Fatigued but otherwise doing well.  Rate controlled and regular. Continue current medications per cardiology Discontinued metoprolol Start carvedilol 6.25 mg BID as recommended  Follow-up with cardiology as scheduled  Orders: -     AMB Referral to [facility]ordinaton (ACO Patients)   PDMP reviewed  Return in about 2 weeks (around 06/10/2022) for PCP.  Dana Allan, MD

## 2022-05-27 NOTE — CV Procedure (Signed)
Direct current cardioversion:  Indication symptomatic: Symptomatic atrial fib/flutter  Procedure: Under deep sedation administered and monitored by ERP, synchronized direct current cardioversion performed. Patient was delivered with 120 Joules of electricity X 1 with success to NSR. Patient tolerated the procedure well. No immediate complication noted.   Elder Negus, MD [facility]. PA Pager: [phone] Office: [phone]

## 2022-05-27 NOTE — ED Notes (Signed)
Put patient on the bedpan patient is now off with call bell in reach got patient some warm blankets

## 2022-05-27 NOTE — ED Notes (Signed)
Dr. Felipe Drone present to perform synchronized cardioversion

## 2022-05-27 NOTE — Patient Instructions (Signed)
It was a pleasure meeting you today. Thank you for allowing me to take part in your health care.  Our goals for today as we discussed include:  Decrease night time Lantus to 10 units Continue morning Lantus 35 units  Monitor blood sugars and let me know next week if any increase in sugars  Start the Carvedilol that was ordered today  Follow up with Cardiology as schedule  Referral sent for assistance at home  Follow up in 2 weeks  If you have any questions or concerns, please do not hesitate to call the office at [phone].  I look forward to our next visit and until then take care and stay safe.  Regards,   Dana Allan, MD   [facility]

## 2022-05-27 NOTE — ED Provider Notes (Signed)
[facility] Emergency Department Provider Note MRN:  [id number]  Arrival date & time: 05/27/22     Chief Complaint   Tachycardia   History of Present Illness   [name] [name] is a 72 y.o. year-old female with a history of A-fib, CHF, diabetes, stroke presenting to the ED with chief complaint of tachycardia.  Malaise fatigue and intermittent tachycardia for several days.  Was here in the emergency department few days ago but left due to wait times.  Stayed in bed all day yesterday due to decreased energy.  Denies fever, no chest pain, does endorse dyspnea on exertion.  Found to have A-fib with RVR with EMS this evening.  Review of Systems  A thorough review of systems was obtained and all systems are negative except as noted in the HPI and PMH.   Patient's Health History    Past Medical History:  Diagnosis Date   Abdominal pain    Abnormal chest x-ray 02/08/2021   Acute respiratory failure with hypoxia (HCC)    Arthritis    Atrial flutter (HCC)    Atrial tachycardia 07/29/2019   Back pain    Back pain 11/15/2014   Bilateral leg edema 03/18/2020   Blood transfusion without reported diagnosis    Chronic anticoagulation - on Eliquis for chronic afib 02/04/2020   Chronic combined systolic and diastolic CHF (congestive heart failure) (HCC) 02/04/2020   Chronic heart failure with preserved ejection fraction (HFpEF) (HCC) 03/18/2020   LVEF less than 20% (May 2022), LVEF 55 to 60% (September 2022)   Constipation    Contact dermatitis 08/12/2020   COVID-19 02/04/2020   Diabetes mellitus without complication (HCC)    Diabetes mellitus without complication (HCC)    Dysphagia 02/03/2020   Epigastric pain 02/03/2020   Fatigue    Fatigue    Full dentures    Full dentures    GERD (gastroesophageal reflux disease)    Hemorrhoids    History of esophageal stricture 07/29/2019   History of noncompliance with medical treatment, presenting hazards to  health    Hospital discharge follow-up 07/27/2020   Hyperlipidemia    Hypertension    Intertrigo 09/04/2020   Lack of adequate sleep    Lack of adequate sleep    Liver mass    Liver mass    Morbid obesity (HCC) 02/03/2020   Nonrheumatic tricuspid valve regurgitation 03/18/2020   Oral thrush 02/04/2020   Osteopenia after menopause 06/09/2017   PAF (paroxysmal atrial fibrillation) (HCC)    Pericardial effusion 05/12/2010   Pleural effusion 06/07/2020   Pleural effusion on left    QT prolongation    Rash    on right breast   Right upper quadrant abdominal pain    Stroke (cerebrum) (HCC) 05/12/2020   Uncontrolled type 2 diabetes mellitus with hyperglycemia (HCC) 06/08/2017   Wears glasses    Wears glasses     Past Surgical History:  Procedure Laterality Date   BREAST DUCTAL SYSTEM EXCISION     BREAST EXCISIONAL BIOPSY Left    CARDIAC SURGERY     CARDIOVERSION N/A 07/31/2019   Procedure: CARDIOVERSION;  Surgeon: Chrystie Nose, MD;  Location: [facility] ENDOSCOPY;  Service: Cardiovascular;  Laterality: N/A;   CARDIOVERSION N/A 05/19/2020   Procedure: CARDIOVERSION;  Surgeon: Dolores Patty, MD;  Location: [facility] ENDOSCOPY;  Service: Cardiovascular;  Laterality: N/A;   CESAREAN SECTION     CHOLECYSTECTOMY     CHOLECYSTECTOMY, LAPAROSCOPIC  07/24/2010   ESOPHAGEAL DILATION  IR CT HEAD LTD  05/12/2020   IR PERCUTANEOUS ART THROMBECTOMY/INFUSION INTRACRANIAL INC DIAG ANGIO  05/12/2020       IR PERCUTANEOUS ART THROMBECTOMY/INFUSION INTRACRANIAL INC DIAG ANGIO  05/12/2020   IR US GUIDE VASC ACCESS RIGHT  05/12/2020   RADIOLOGY WITH ANESTHESIA N/A 05/12/2020   Procedure: IR WITH ANESTHESIA;  Surgeon: Radiologist, Medication, MD;  [facility];  Service: Radiology;  Laterality: N/A;   TEE WITHOUT CARDIOVERSION N/A 07/31/2019   Procedure: TRANSESOPHAGEAL ECHOCARDIOGRAM (TEE);  Surgeon: Chrystie Nose, MD;  Location: [facility] ENDOSCOPY;  Service: Cardiovascular;  Laterality: N/A;   TEE  WITHOUT CARDIOVERSION N/A 05/19/2020   Procedure: TRANSESOPHAGEAL ECHOCARDIOGRAM (TEE);  Surgeon: Dolores Patty, MD;  Location: [facility] ENDOSCOPY;  Service: Cardiovascular;  Laterality: N/A;    Family History  Problem Relation Age of Onset   Stroke Mother    Alcohol abuse Father    Other Father        broken heart after spouse spouse   Diabetes Sister    Hypertension Sister    Diabetes Sister    Multiple sclerosis Sister    Miscarriages / [place] Daughter    Drug abuse Daughter    Alcohol abuse Other    Diabetes Other    Breast cancer Neg Hx     Social History   Socioeconomic History   Marital status: Single    Spouse name: Not on file   Number of children: 3   Years of education: Not on file   Highest education level: High school graduate  Occupational History   Occupation: Retired    Associate Professor: OTHER    Comment: CNA  Tobacco Use   Smoking status: Never    Passive exposure: Never   Smokeless tobacco: Never  Vaping Use   Vaping Use: Never used  Substance and Sexual Activity   Alcohol use: No    Alcohol/week: 0.0 standard drinks of alcohol   Drug use: No   Sexual activity: Not Currently  Other Topics Concern   Not on file  Social History Narrative   Patient lives alone in an apartment complex for seniors.   Patient does not drive. She walks to surrounding places near her complex or her children run errands for her. Patient never goes without.   Patient enjoys sewing, crafts, shopping, and walking.    Patient has 3 children and she is close with all of them.        Social Determinants of Health   Financial Resource Strain: Low Risk  (05/14/2022)   Overall Financial Resource Strain (CARDIA)    Difficulty of Paying Living Expenses: Not hard at all  Food Insecurity: No Food Insecurity (05/14/2022)   Hunger Vital Sign    Worried About Running Out of Food in the Last Year: Never true    Ran Out of Food in the Last Year: Never true  Transportation Needs: No  Transportation Needs (05/14/2022)   PRAPARE - Administrator, Civil Service (Medical): No    Lack of Transportation (Non-Medical): No  Physical Activity: Insufficiently Active (05/14/2022)   Exercise Vital Sign    Days of Exercise per Week: 2 days    Minutes of Exercise per Session: 30 min  Stress: No Stress Concern Present (05/14/2022)   [employer] of Occupational Health - Occupational Stress Questionnaire    Feeling of Stress : Not at all  Social Connections: Socially Isolated (05/14/2022)   Social Connection and Isolation Panel [NHANES]    Frequency of Communication  with Friends and Family: More than three times a week    Frequency of Social Gatherings with Friends and Family: Once a week    Attends Religious Services: Never    Database administrator or Organizations: No    Attends Banker Meetings: Never    Marital Status: Never married  Intimate Partner Violence: Not At Risk (05/14/2022)   Humiliation, Afraid, Rape, and Kick questionnaire    Fear of Current or Ex-Partner: No    Emotionally Abused: No    Physically Abused: No    Sexually Abused: No     Physical Exam   Vitals:   05/27/22 0650 05/27/22 0703  BP:  (!) 141/98  Pulse:  (!) 116  Resp:  (!) 23  Temp: 99.3 F (37.4 C)   SpO2:  96%    CONSTITUTIONAL: Well-appearing, NAD NEURO/PSYCH:  Alert and oriented x 3, no focal deficits EYES:  eyes equal and reactive ENT/NECK:  no LAD, no JVD CARDIO: Tachycardic and irregular rate, well-perfused, normal S1 and S2 PULM:  CTAB no wheezing or rhonchi GI/GU:  non-distended, non-tender MSK/SPINE:  No gross deformities, no edema SKIN:  no rash, atraumatic   *Additional and/or pertinent findings included in MDM below  Diagnostic and Interventional Summary    EKG Interpretation  Date/Time:  Thursday May 27 2022 07:04:05 EDT Ventricular Rate:  117 PR Interval:  150 QRS Duration: 81 QT Interval:  310 QTC Calculation: 433 R Axis:   81 Text  Interpretation: Sinus tachycardia Borderline right axis deviation Confirmed by Lockie Mola, Adam ([id number]) on 05/27/2022 7:24:53 AM       Labs Reviewed  CBC - Abnormal; Notable for the following components:      Result Value   RBC 5.56 (*)    MCV 78.1 (*)    MCH 23.9 (*)    RDW 15.9 (*)    All other components within normal limits  COMPREHENSIVE METABOLIC PANEL  PROTIME-INR  URINALYSIS, ROUTINE W REFLEX MICROSCOPIC    DG Chest Port 1 View  Final Result      Medications  diltiazem (CARDIZEM) 1 mg/mL load via infusion 20 mg (20 mg Intravenous Bolus from Bag 05/27/22 0640)    And  diltiazem (CARDIZEM) 125 mg in dextrose 5% 125 mL (1 mg/mL) infusion (5 mg/hr Intravenous New Bag/Given 05/27/22 0723)  etomidate (AMIDATE) injection 10 mg (has no administration in time range)  diltiazem (CARDIZEM CD) 24 hr capsule 120 mg (has no administration in time range)  sodium chloride 0.9 % bolus 500 mL (500 mLs Intravenous New Bag/Given 05/27/22 0645)     Procedures  /  Critical Care .Critical Care  Performed by: Sabas Sous, MD Authorized by: Sabas Sous, MD   Critical care provider statement:    Critical care time (minutes):  32   Critical care was necessary to treat or prevent imminent or life-threatening deterioration of the following conditions: A-fib with RVR.   Critical care was time spent personally by me on the following activities:  Development of treatment plan with patient or surrogate, discussions with consultants, evaluation of patient's response to treatment, examination of patient, ordering and review of laboratory studies, ordering and review of radiographic studies, ordering and performing treatments and interventions, pulse oximetry, re-evaluation of patient's condition and review of old charts   ED Course and Medical Decision Making  Initial Impression and Ddx A-fib with RVR, possibly has been in A-fib for several days.  Case discussed with Dr. Melvenia Needles of being a  cardiology.  Given that she has been fully compliant with Eliquis over the past month cardioversion is indicated.  Past medical/surgical history that increases complexity of ED encounter: History of A-fib/flutter  Interpretation of Diagnostics I personally reviewed the EKG and my interpretation is as follows: A-fib with RVR  Labs, chest x-ray pending  Patient Reassessment and Ultimate Disposition/Management     On reassessment, when setting up for cardioversion patient's heart rate improved to 118 and appears to be in sinus tachycardia, no longer in A-fib.  Will hold off on cardioversion, cardiology will come to evaluate the patient.  Signed out to oncoming provider at shift change.  Patient management required discussion with the following services or consulting groups:  Cardiology  Complexity of Problems Addressed Acute illness or injury that poses threat of life of bodily function  Additional Data Reviewed and Analyzed Further history obtained from: Further history from spouse/family member and Prior labs/imaging results  Additional Factors Impacting ED Encounter Risk Consideration of hospitalization  Elmer Sow. Pilar Plate, MD [facility] Emergency Medicine [facility] [email or web address]  Final Clinical Impressions(s) / ED Diagnoses     ICD-10-CM   1. Atrial fibrillation with RVR (HCC)  I48.91       ED Discharge Orders     None        Discharge Instructions Discussed with and Provided to Patient:   Discharge Instructions   None      Sabas Sous, MD 05/27/22 0730

## 2022-05-28 ENCOUNTER — Telehealth: Payer: Self-pay

## 2022-05-28 ENCOUNTER — Telehealth: Payer: Self-pay | Admitting: *Deleted

## 2022-05-28 NOTE — Progress Notes (Unsigned)
   Care Guide Note  05/28/2022 Name: Mersaydes Obst MRN: [id number] DOB: 11-24-1950  Referred by: Dana Allan, MD Reason for referral : Care Coordination (Outreach to schedule with Pharm d )   Dionysia Pieroni is a 72 y.o. year old female who is a primary care patient of Dana Allan, MD. Faithann Vallese was referred to the pharmacist for assistance related to DM.    An unsuccessful telephone outreach was attempted today to contact the patient who was referred to the pharmacy team for assistance with medication management. Additional attempts will be made to contact the patient.   Penne Lash, RMA Care Guide [facility]  [place], [place] [address] Direct Dial: [phone] [email or web address]

## 2022-05-28 NOTE — Telephone Encounter (Signed)
   Telephone encounter was:  Successful.  05/28/2022 Name: [name] [name] MRN: [id number] DOB: 1951/01/04  [name] is a 72 y.o. year old female who is a primary care patient of Dana Allan, MD . The community resource team was consulted for assistance with Patient needs PCS services and copayment combined is higher than she can pay. Food stamps is only 70 is not enough , Referal to RN casemanagement  Will mail [facility] access application  Care guide performed the following interventions: Patient provided with information about care guide support team and interviewed to confirm resource needs.  Follow Up Plan:  No further follow up planned at this time. The patient has been provided with needed resources.  Yehuda Mao Greenauer -[facility] [place], Population Health [phone] [address] [place] , [place] [address] Email : [email or web address]

## 2022-05-28 NOTE — Progress Notes (Signed)
  Care Coordination   Note   05/28/2022 Name: Gabrialle Divito MRN: [id number] DOB: 09-06-1950  Shalah Marian Sasso is a 72 y.o. year old female who sees Dana Allan, MD for primary care. I reached out to [name] by phone today to offer care coordination services.  Ms. Kilmer was given information about Care Coordination services today including:   The Care Coordination services include support from the care team which includes your Nurse Coordinator, Clinical Social Worker, or Pharmacist.  The Care Coordination team is here to help remove barriers to the health concerns and goals most important to you. Care Coordination services are voluntary, and the patient may decline or stop services at any time by request to their care team member.   Care Coordination Consent Status: Patient agreed to services and verbal consent obtained.   Follow up plan:  Telephone appointment with care coordination team member scheduled for:  06/07/2022  Encounter Outcome:  Pt. Scheduled  Burman Nieves, CCMA Care Coordination Care Guide Direct Dial: [phone]

## 2022-06-01 ENCOUNTER — Telehealth: Payer: Self-pay | Admitting: *Deleted

## 2022-06-01 NOTE — Progress Notes (Signed)
   Care Guide Note  06/01/2022 Name: [name] [name] MRN: [id number] DOB: [birth year]  Referred by: Dana Allan, MD Reason for referral : Care Coordination (Outreach to schedule with Pharm d )   [name] [name] is a 72 y.o. year old female who is a primary care patient of Dana Allan, MD. [name] [name] was referred to the pharmacist for assistance related to DM.    Successful contact was made with the patient to discuss pharmacy services. Patient declines engagement at this time. Contact information was provided to the patient should they wish to reach out for assistance at a later time.  [name] [name], RMA Care Guide [facility]  [place], [place] [address] Direct Dial: [phone] [name]

## 2022-06-01 NOTE — Telephone Encounter (Signed)
Transition Care Management Unsuccessful Follow-up Telephone Call  Date of discharge and from where:  [place]  5/16  Attempts:  1st Attempt  Reason for unsuccessful TCM follow-up call:  Missing or invalid number

## 2022-06-03 ENCOUNTER — Telehealth: Payer: Self-pay | Admitting: *Deleted

## 2022-06-03 ENCOUNTER — Ambulatory Visit: Payer: Medicare Other | Admitting: Internal Medicine

## 2022-06-03 ENCOUNTER — Encounter: Payer: Self-pay | Admitting: Internal Medicine

## 2022-06-03 VITALS — BP 116/79 | HR 115 | Resp 16 | Ht 63.0 in | Wt 204.0 lb

## 2022-06-03 DIAGNOSIS — I4891 Unspecified atrial fibrillation: Secondary | ICD-10-CM

## 2022-06-03 DIAGNOSIS — I5032 Chronic diastolic (congestive) heart failure: Secondary | ICD-10-CM

## 2022-06-03 DIAGNOSIS — I10 Essential (primary) hypertension: Secondary | ICD-10-CM

## 2022-06-03 DIAGNOSIS — I480 Paroxysmal atrial fibrillation: Secondary | ICD-10-CM

## 2022-06-03 MED ORDER — RIVAROXABAN 20 MG PO TABS: 20.0000 mg | ORAL_TABLET | Freq: Every day | ORAL | 10 refills | Status: DC

## 2022-06-03 MED ORDER — DRONEDARONE HCL 400 MG PO TABS: 400.0000 mg | ORAL_TABLET | Freq: Two times a day (BID) | ORAL | 10 refills | Status: DC

## 2022-06-03 NOTE — Progress Notes (Signed)
Primary Physician/Referring:  Dana Allan, MD  Patient ID: [name] [name], female    DOB: February 13, 1950, 72 y.o.   MRN: [id number]  Chief Complaint  Patient presents with   Follow-up    1 week   Atrial Fibrillation   HPI:    [name] [name]  is a 72 y.o. female with past medical history significant for A-fib, hypertension, and diabetes who is here for hospital follow-up.  Patient was recently in the hospital for Afib with RVR and she was cardioverted back into NSR. She states she cannot tolerate Eliquis and she would like another blood thinner instead. She feels short of breath from it and she does not want to take it. She does not feel it when she is in Afib. Patient denies chest pain, shortness of breath, palpitations, diaphoresis, syncope, edema, PND, orthopnea.   Past Medical History:  Diagnosis Date   Abdominal pain    Abnormal chest x-ray 02/08/2021   Acute respiratory failure with hypoxia (HCC)    Arthritis    Atrial flutter (HCC)    Atrial tachycardia 07/29/2019   Back pain    Back pain 11/15/2014   Bilateral leg edema 03/18/2020   Blood transfusion without reported diagnosis    Chronic anticoagulation - on Eliquis for chronic afib 02/04/2020   Chronic combined systolic and diastolic CHF (congestive heart failure) (HCC) 02/04/2020   Chronic heart failure with preserved ejection fraction (HFpEF) (HCC) 03/18/2020   LVEF less than 20% (May 2022), LVEF 55 to 60% (September 2022)   Constipation    Contact dermatitis 08/12/2020   COVID-19 02/04/2020   Diabetes mellitus without complication (HCC)    Diabetes mellitus without complication (HCC)    Dysphagia 02/03/2020   Epigastric pain 02/03/2020   Fatigue    Fatigue    Full dentures    Full dentures    GERD (gastroesophageal reflux disease)    Hemorrhoids    History of esophageal stricture 07/29/2019   History of noncompliance with medical treatment, presenting hazards to health    Hospital discharge  follow-up 07/27/2020   Hyperlipidemia    Hypertension    Intertrigo 09/04/2020   Lack of adequate sleep    Lack of adequate sleep    Liver mass    Liver mass    Morbid obesity (HCC) 02/03/2020   Nonrheumatic tricuspid valve regurgitation 03/18/2020   Oral thrush 02/04/2020   Osteopenia after menopause 06/09/2017   PAF (paroxysmal atrial fibrillation) (HCC)    Pericardial effusion 05/12/2010   Pleural effusion 06/07/2020   Pleural effusion on left    QT prolongation    Rash    on right breast   Right upper quadrant abdominal pain    Stroke (cerebrum) (HCC) 05/12/2020   Uncontrolled type 2 diabetes mellitus with hyperglycemia (HCC) 06/08/2017   Wears glasses    Wears glasses    Past Surgical History:  Procedure Laterality Date   BREAST DUCTAL SYSTEM EXCISION     BREAST EXCISIONAL BIOPSY Left    CARDIAC SURGERY     CARDIOVERSION N/A 07/31/2019   Procedure: CARDIOVERSION;  Surgeon: Chrystie Nose, MD;  Location: [facility] ENDOSCOPY;  Service: Cardiovascular;  Laterality: N/A;   CARDIOVERSION N/A 05/19/2020   Procedure: CARDIOVERSION;  Surgeon: Dolores Patty, MD;  Location: [facility] ENDOSCOPY;  Service: Cardiovascular;  Laterality: N/A;   CESAREAN SECTION     CHOLECYSTECTOMY     CHOLECYSTECTOMY, LAPAROSCOPIC  07/24/2010   ESOPHAGEAL DILATION     IR CT HEAD LTD  05/12/2020   IR PERCUTANEOUS ART THROMBECTOMY/INFUSION INTRACRANIAL INC DIAG ANGIO  05/12/2020       IR PERCUTANEOUS ART THROMBECTOMY/INFUSION INTRACRANIAL INC DIAG ANGIO  05/12/2020   IR US GUIDE VASC ACCESS RIGHT  05/12/2020   RADIOLOGY WITH ANESTHESIA N/A 05/12/2020   Procedure: IR WITH ANESTHESIA;  Surgeon: Radiologist, Medication, MD;  Location: [facility] OR;  Service: Radiology;  Laterality: N/A;   TEE WITHOUT CARDIOVERSION N/A 07/31/2019   Procedure: TRANSESOPHAGEAL ECHOCARDIOGRAM (TEE);  Surgeon: Chrystie Nose, MD;  Location: [facility] ENDOSCOPY;  Service: Cardiovascular;  Laterality: N/A;   TEE WITHOUT CARDIOVERSION N/A  05/19/2020   Procedure: TRANSESOPHAGEAL ECHOCARDIOGRAM (TEE);  Surgeon: Dolores Patty, MD;  Location: [facility] ENDOSCOPY;  Service: Cardiovascular;  Laterality: N/A;   Family History  Problem Relation Age of Onset   Stroke Mother    Alcohol abuse Father    Other Father        broken heart after spouse spouse   Diabetes Sister    Hypertension Sister    Diabetes Sister    Multiple sclerosis Sister    Miscarriages / [place] Daughter    Drug abuse Daughter    Alcohol abuse Other    Diabetes Other    Breast cancer Neg Hx     Social History   Tobacco Use   Smoking status: Never    Passive exposure: Never   Smokeless tobacco: Never  Substance Use Topics   Alcohol use: No    Alcohol/week: 0.0 standard drinks of alcohol   Marital Status: Single  ROS  Review of Systems  Cardiovascular:  Negative for chest pain, claudication, cyanosis, dyspnea on exertion, irregular heartbeat, leg swelling, orthopnea and palpitations.   Objective  Blood pressure 116/79, pulse (!) 115, resp. rate 16, height 5\' 3"  (1.6 m), weight 204 lb (92.5 kg), SpO2 95 %. Body mass index is 36.14 kg/m.     06/03/2022   10:24 AM 05/27/2022    4:13 PM 05/27/2022    9:45 AM  Vitals with BMI  Height 5\' 3"  5\' 3"    Weight 204 lbs 201 lbs   BMI 36.15 35.61   Systolic 116 118 161  Diastolic 79 78 85  Pulse 115 93 93    Physical Exam Vitals reviewed.  HENT:     Head: Normocephalic and atraumatic.  Cardiovascular:     Rate and Rhythm: Normal rate. Rhythm irregular.     Pulses: Normal pulses.     Heart sounds: Normal heart sounds. No murmur heard. Pulmonary:     Effort: Pulmonary effort is normal.     Breath sounds: Normal breath sounds.  Abdominal:     General: Bowel sounds are normal.  Musculoskeletal:     Right lower leg: No edema.     Left lower leg: No edema.  Skin:    General: Skin is warm and dry.  Neurological:     Mental Status: She is alert.     Medications and allergies   Allergies   Allergen Reactions   Food Shortness Of Breath, Swelling and Other (See Comments)    Fruit - tongue swelling/numbness (ok with organic fruit) Tree nuts - tongue swelling/numbness (ok with organic nuts)   Pacerone [Amiodarone] Shortness Of Breath and Other (See Comments)    Myalgias  Wheezing  Asthenia    Peanut (Diagnostic) Shortness Of Breath and Swelling    Tongue swelling/numbness   Toprol Xl [Metoprolol] Shortness Of Breath, Other (See Comments) and Cough    Myalgias Wheezing Asthenia  Asa [Aspirin] Nausea And Vomiting and Other (See Comments)    Told to avoid due to liver issues   Lactose Intolerance (Gi) Diarrhea and Nausea And Vomiting     Medication list after today's encounter   Current Outpatient Medications:    acetaminophen (TYLENOL) 650 MG CR tablet, Take 1 tablet (650 mg total) by mouth every 8 (eight) hours as needed for pain., Disp: , Rfl:    amLODipine (NORVASC) 10 MG tablet, Take 1 tablet (10 mg total) by mouth at bedtime., Disp: 90 tablet, Rfl: 1   atorvastatin (LIPITOR) 80 MG tablet, TAKE 1 TABLET(80 MG) BY MOUTH DAILY, Disp: 90 tablet, Rfl: 2   blood glucose meter kit and supplies, Dispense based on patient and insurance preference. Use up to four times daily as directed. (FOR ICD-10 E10.9, E11.9)., Disp: 1 each, Rfl: 0   carvedilol (COREG) 6.25 MG tablet, Take 1 tablet (6.25 mg total) by mouth 2 (two) times daily with a meal., Disp: 60 tablet, Rfl: 0   Continuous Blood Gluc Sensor (FREESTYLE LIBRE 2 SENSOR) MISC, Apply 1 to skin every 14 days, Disp: 1 each, Rfl: 5   Continuous Glucose Sensor (FREESTYLE LIBRE 3 SENSOR) MISC, Place 1 sensor on the skin every 14 days. Use to check glucose continuously, Disp: 2 each, Rfl: 5   dronedarone (MULTAQ) 400 MG tablet, Take 1 tablet (400 mg total) by mouth 2 (two) times daily with a meal., Disp: 60 tablet, Rfl: 10   insulin glargine (LANTUS SOLOSTAR) 100 UNIT/ML Solostar Pen, ADMINISTER 35 UNITS UNDER THE SKIN in am and 20  units at night, Disp: 15 mL, Rfl: 2   Insulin Pen Needle (PEN NEEDLES) 32G X 6 MM MISC, 32G x 6 MM pen needles, Disp: 100 each, Rfl: 0   magnesium oxide (MAG-OX) 400 MG tablet, Take by mouth., Disp: , Rfl:    pantoprazole (PROTONIX) 40 MG tablet, TAKE 1 TABLET(40 MG) BY MOUTH DAILY, Disp: 90 tablet, Rfl: 2   rivaroxaban (XARELTO) 20 MG TABS tablet, Take 1 tablet (20 mg total) by mouth daily with supper., Disp: 30 tablet, Rfl: 10   Semaglutide,0.25 or 0.5MG /DOS, (OZEMPIC, 0.25 OR 0.5 MG/DOSE,) 2 MG/1.5ML SOPN, Inject 0.5 mg into the skin once a week., Disp: 1.5 mL, Rfl: 0  Laboratory examination:   Lab Results  Component Value Date   NA 133 (L) 05/27/2022   K 4.5 05/27/2022   CO2 19 (L) 05/27/2022   GLUCOSE 179 (H) 05/27/2022   BUN 19 05/27/2022   CREATININE 1.03 (H) 05/27/2022   CALCIUM 8.8 (L) 05/27/2022   EGFR 59 (L) 05/11/2021   GFRNONAA 58 (L) 05/27/2022       Latest Ref Rng & Units 05/27/2022    6:40 AM 05/13/2022    7:24 PM 04/22/2022    4:48 PM  CMP  Glucose 70 - 99 mg/dL 161  096  045   BUN 8 - 23 mg/dL 19  26  26    Creatinine 0.44 - 1.00 mg/dL 4.09  8.11  9.14   Sodium 135 - 145 mmol/L 133  137  140   Potassium 3.5 - 5.1 mmol/L 4.5  3.6  4.2   Chloride 98 - 111 mmol/L 103  103  100   CO2 22 - 32 mmol/L 19  25  27    Calcium 8.9 - 10.3 mg/dL 8.8  8.9  9.3   Total Protein 6.5 - 8.1 g/dL 7.9  7.1    Total Bilirubin 0.3 - 1.2 mg/dL 0.5  0.4  Alkaline Phos 38 - 126 U/L 104  96    AST 15 - 41 U/L 27  16    ALT 0 - 44 U/L 49  19        Latest Ref Rng & Units 05/27/2022    6:40 AM 05/13/2022    7:24 PM 04/13/2022    2:49 PM  CBC  WBC 4.0 - 10.5 K/uL 10.3  9.6  8.4   Hemoglobin 12.0 - 15.0 g/dL 16.1  09.6  04.5   Hematocrit 36.0 - 46.0 % 43.4  43.1  40.9   Platelets 150 - 400 K/uL 231  242  219.0     Lipid Panel No results for input(s): "CHOL", "TRIG", "LDLCALC", "VLDL", "HDL", "CHOLHDL", "LDLDIRECT" in the last 8760 hours.   HEMOGLOBIN A1C Lab Results  Component  Value Date   HGBA1C 10.9 (H) 04/13/2022   MPG 246 12/19/2021   TSH Recent Labs    02/05/22 2255  TSH 2.727    External labs:     Radiology:    Cardiac Studies:   PCV ECHOCARDIOGRAM COMPLETE 10/15/2020 Echocardiogram 09/30/2020: 1. Normal LV systolic function with visual EF 55-60%. Left ventricle cavity is normal in size. Moderate left ventricular hypertrophy. Normal global wall motion.Doppler evidence of grade III (restrictive) diastolic dysfunction, elevated LAP. 2. Left atrial cavity is mildly dilated. 3. Mild (Grade I) mitral regurgitation. 4. Mild tricuspid regurgitation. No evidence of pulmonary hypertension. RVSP measures 33 mmHg. 5. Mild pulmonic regurgitation. 6. Small pericardial effusion, located posteriorly, without hemodynamic significance. 7. IVC is normal with a respiratory response of <50%. 8. Compared to study 05/12/2020 LVEF improved from <20% to 55-60%, RV function has improved, pulmonary hypertension has resolved prior RVSP 40.68mmHg and now , pericardial effusion was large and now small in size, Moderate / severe TR is now mild. Otherwise, no significant change.    ECHO COMPLETE WITH IMAGING ENHANCING AGENT 02/04/2020 1. Since the last study on 07/27/2019 LVEF has decreased, now severely impaired at 25-30% and diffuse hypokinesis. Severe concentric LVH. RVEF is at least moderately decreased. Evaluation for infiltrative cardiomyopathies such as cardiac amyloidosis should be considered. 2. Left ventricular ejection fraction, by estimation, is 25 to 30%. The left ventricle has severely decreased function. The left ventricle has no regional wall motion abnormalities. There is severe concentric left ventricular hypertrophy. Left ventricular diastolic function could not be evaluated. 3. Right ventricular systolic function is moderately reduced. The right ventricular size is moderately enlarged. There is moderately elevated pulmonary artery systolic pressure.  The estimated right ventricular systolic pressure is 50.3 mmHg. 4. Left atrial size was mildly dilated. 5. The mitral valve is normal in structure. Mild mitral valve regurgitation. No evidence of mitral stenosis. 6. Tricuspid valve regurgitation is moderate to severe. 7. The aortic valve is normal in structure. Aortic valve regurgitation is not visualized. Mild to moderate aortic valve sclerosis/calcification is present, without any evidence of aortic stenosis. 8. The inferior vena cava is normal in size with greater than 50% respiratory variability, suggesting right atrial pressure of 3 mmHg.     ECHO 12/19/2021  1. Left ventricular ejection fraction, by estimation, is 55 to 60%. The  left ventricle has normal function. The left ventricle has no regional  wall motion abnormalities. There is moderate concentric left ventricular  hypertrophy. Left ventricular  diastolic parameters are consistent with Grade III diastolic dysfunction  (restrictive).   2. Right ventricular systolic function is normal. The right ventricular  size is normal. Tricuspid regurgitation signal is inadequate  for assessing  PA pressure.   3. A small pericardial effusion is present. There is no evidence of  cardiac tamponade.   4. The mitral valve is grossly normal. Trivial mitral valve  regurgitation. No evidence of mitral stenosis.   5. The aortic valve is tricuspid. Aortic valve regurgitation is not  visualized. No aortic stenosis is present.   6. There is mild dilatation of the aortic root, measuring 41 mm.   7. The inferior vena cava is normal in size with greater than 50%  respiratory variability, suggesting right atrial pressure of 3 mmHg.    EKG:   12/30/2021: Sinus Rhythm, LVH. ST-T wave changes 2/2 hypertrophy.  06/03/2022: atrial flutter with rate of 115 bpm   Assessment     ICD-10-CM   1. Paroxysmal A-fib (HCC)  I48.0 EKG 12-Lead    Ambulatory referral to Cardiac Electrophysiology    2. Atrial  fibrillation with RVR (HCC)  I48.91 Ambulatory referral to Cardiac Electrophysiology    3. Chronic heart failure with preserved ejection fraction (HFpEF) (HCC)  I50.32     4. Essential hypertension  I10        Orders Placed This Encounter  Procedures   Ambulatory referral to Cardiac Electrophysiology    Referral Priority:   Routine    Referral Type:   Consultation    Referral Reason:   Specialty Services Required    Referred to Provider:   Duke Salvia, MD    Requested Specialty:   Cardiology    Number of Visits Requested:   1   EKG 12-Lead    Meds ordered this encounter  Medications   dronedarone (MULTAQ) 400 MG tablet    Sig: Take 1 tablet (400 mg total) by mouth 2 (two) times daily with a meal.    Dispense:  60 tablet    Refill:  10   rivaroxaban (XARELTO) 20 MG TABS tablet    Sig: Take 1 tablet (20 mg total) by mouth daily with supper.    Dispense:  30 tablet    Refill:  10    Medications Discontinued During This Encounter  Medication Reason   potassium chloride (KLOR-CON) 10 MEQ tablet    metoprolol succinate (TOPROL XL) 50 MG 24 hr tablet    furosemide (LASIX) 40 MG tablet    fluticasone (FLONASE) 50 MCG/ACT nasal spray    amiodarone (PACERONE) 200 MG tablet    apixaban (ELIQUIS) 5 MG TABS tablet      Recommendations:   [name] [name] is a 72 y.o.  female who is here for hospital follow-up   Chronic heart failure with preserved ejection fraction (HFpEF)  Continue Toprol 50mg  daily She remains euvolemic without HF exacerbation   Essential hypertension Continue current cardiac medications. Encourage low-sodium diet, less than 2000 mg daily. Her blood pressure is very well controlled today   Paroxysmal A-fib  She does not tolerate amio or Eliquis I will switch to Multaq and Xarelto I will also refer her to EP for possible ablation     [name] Dieter, DO, [facility]  06/04/2022, 1:24 PM Office: [phone] Pager: [phone]

## 2022-06-03 NOTE — Telephone Encounter (Signed)
Transition Care Management Follow-up Telephone Call Date of discharge and from where: 05/27/2022  [facility]  How have you been since you were released from the hospital? 8days Any questions or concerns? No  Items Reviewed: Did the pt receive and understand the discharge instructions provided? Yes  Medications obtained and verified? Yes  Other? No  Any new allergies since your discharge? No  Dietary orders reviewed? No Do you have support at home? Yes    Follow up appointments reviewed:  PCP Hospital f/u appt confirmed? Yes  Saw already Specialist Hospital f/u appt confirmed? Yes  Saw cardiologist today  Are transportation arrangements needed? No  If their condition worsens, is the pt aware to call PCP or go to the Emergency Dept.? Yes Was the patient provided with contact information for the PCP's office or ED? Yes Was to pt encouraged to call back with questions or concerns? Yes

## 2022-06-04 ENCOUNTER — Ambulatory Visit
Admission: RE | Admit: 2022-06-04 | Discharge: 2022-06-04 | Disposition: A | Discharge status: Home / Self Care | Payer: Medicare Other | Source: Ambulatory Visit | Attending: Family Medicine | Admitting: Family Medicine

## 2022-06-04 ENCOUNTER — Telehealth: Payer: Self-pay | Admitting: Family Medicine

## 2022-06-04 DIAGNOSIS — Z1231 Encounter for screening mammogram for malignant neoplasm of breast: Secondary | ICD-10-CM

## 2022-06-04 NOTE — Telephone Encounter (Signed)
Discussed with Dr Melton Alar, cardiology, for clarifying recent medication changes BB Continue Carvedilol 6.25 mg BID Discontinued Metoprolol  Dana Allan, MD

## 2022-06-08 ENCOUNTER — Ambulatory Visit: Payer: Self-pay

## 2022-06-08 NOTE — Patient Outreach (Signed)
  Care Coordination   06/08/2022 Name: [name] [name] MRN: [id number] DOB: 22-Nov-1950   Care Coordination Outreach Attempts:  An unsuccessful telephone outreach was attempted today to offer the patient information about available care coordination services.  Follow Up Plan:  Additional outreach attempts will be made to offer the patient care coordination information and services.   Encounter Outcome:  No Answer   Care Coordination Interventions:  No, not indicated    SIG Lysle Morales, BSW Social Worker [facility] Care Management  [phone]

## 2022-06-09 ENCOUNTER — Encounter: Payer: Self-pay | Admitting: Family Medicine

## 2022-06-10 ENCOUNTER — Ambulatory Visit (INDEPENDENT_AMBULATORY_CARE_PROVIDER_SITE_OTHER): Payer: Medicare Other | Admitting: Family Medicine

## 2022-06-10 ENCOUNTER — Encounter: Payer: Self-pay | Admitting: Family Medicine

## 2022-06-10 VITALS — BP 126/68 | HR 104 | Ht 63.0 in | Wt 200.0 lb

## 2022-06-10 DIAGNOSIS — I4891 Unspecified atrial fibrillation: Secondary | ICD-10-CM

## 2022-06-10 DIAGNOSIS — E1169 Type 2 diabetes mellitus with other specified complication: Secondary | ICD-10-CM | POA: Diagnosis not present

## 2022-06-10 DIAGNOSIS — Z1211 Encounter for screening for malignant neoplasm of colon: Secondary | ICD-10-CM

## 2022-06-10 DIAGNOSIS — E1165 Type 2 diabetes mellitus with hyperglycemia: Secondary | ICD-10-CM | POA: Diagnosis not present

## 2022-06-10 DIAGNOSIS — I152 Hypertension secondary to endocrine disorders: Secondary | ICD-10-CM

## 2022-06-10 DIAGNOSIS — Z794 Long term (current) use of insulin: Secondary | ICD-10-CM | POA: Diagnosis not present

## 2022-06-10 DIAGNOSIS — E785 Hyperlipidemia, unspecified: Secondary | ICD-10-CM | POA: Diagnosis not present

## 2022-06-10 DIAGNOSIS — E1159 Type 2 diabetes mellitus with other circulatory complications: Secondary | ICD-10-CM

## 2022-06-10 MED ORDER — CARVEDILOL 6.25 MG PO TABS
6.2500 mg | ORAL_TABLET | Freq: Two times a day (BID) | ORAL | 3 refills | Status: DC
Start: 2022-06-10 — End: 2022-06-22

## 2022-06-10 MED ORDER — LANTUS SOLOSTAR 100 UNIT/ML ~~LOC~~ SOPN: PEN_INJECTOR | SUBCUTANEOUS | 2 refills | Status: DC

## 2022-06-10 NOTE — Progress Notes (Signed)
SUBJECTIVE:   Chief Complaint  Patient presents with   Medical Management of Chronic Issues   HPI Presents to clinic for follow up chronic disease management  DM Type 2 Asymptomatic.  CBGs 150's.  Takes Lantus 35 units in am only.  Has self discontinue pm dose.  Now on Ozempic 0.5 mg weekly and tolerating well.  Denies any polyuria, polyphagia or polydipsia. No hypoglycemic episodes.  Afib Switched from eliquis to Xarelto 20 mg daily.  Metoprolol and Amiodarone recently discontinued after cardioversion.  Now on Carvedilol 6.25 mg BID and feeling much better.  Awaiting PA to start Multaq.  Denies any chest pain, heart flutter or increased heart rate.  No signs of bleeding.  Tolerating new medications well.  Requesting refill for carvedilol.  HTN Asymptomatic. Requesting refill of Amlodipine.   PERTINENT PMH / PSH: DM Type 2 HTN HFrEF Afib   OBJECTIVE:  BP 126/68   Pulse (!) 104   Ht 5\' 3"  (1.6 m)   Wt 200 lb (90.7 kg)   SpO2 99%   BMI 35.43 kg/m    Physical Exam Vitals reviewed.  Constitutional:      General: She is not in acute distress.    Appearance: Normal appearance. She is normal weight. She is not ill-appearing, toxic-appearing or diaphoretic.  Eyes:     General:        Right eye: No discharge.        Left eye: No discharge.     Conjunctiva/sclera: Conjunctivae normal.  Cardiovascular:     Rate and Rhythm: Normal rate and regular rhythm.     Heart sounds: Normal heart sounds.  Pulmonary:     Effort: Pulmonary effort is normal.     Breath sounds: Normal breath sounds.  Abdominal:     General: There is no distension.     Palpations: Abdomen is soft.  Musculoskeletal:        General: Normal range of motion.     Right lower leg: No edema.     Left lower leg: No edema.  Skin:    General: Skin is warm and dry.  Neurological:     General: No focal deficit present.     Mental Status: She is alert and oriented to person, place, and time. Mental status is  at baseline.  Psychiatric:        Mood and Affect: Mood normal.        Behavior: Behavior normal.        Thought Content: Thought content normal.        Judgment: Judgment normal.     ASSESSMENT/PLAN:  Type 2 diabetes mellitus with hyperglycemia, with long-term current use of insulin (HCC) Assessment & Plan: Chronic. No hypoglycemic events.   Continue Lantus to 35 units a.m. self discontinued pm dose 10 units Continue Ozempic to 0.5 mg weekly, likely will need increase A1c due in July Endocrinology appointment Jun 10   Orders: -     Lantus SoloStar; ADMINISTER 35 UNITS UNDER THE SKIN in am  Dispense: 15 mL; Refill: 2 -     Microalbumin / creatinine urine ratio; Future  Hyperlipidemia associated with type 2 diabetes mellitus (HCC) Assessment & Plan: Chronic On statin and tolerating well Continue Lipitor 80 mg daily Check fasting lipids at next visit   Hypertension associated with diabetes [facility]) Assessment & Plan: Chronic.  Well controlled. Refill amlodipine 10 mg nightly Refill Carvedilol 6.25 mg BID Metoprolol XL 50 mg discontinued Follow-up with cardiology as scheduled  Orders: -     Carvedilol; Take 1 tablet (6.25 mg total) by mouth 2 (two) times daily with a meal.  Dispense: 180 tablet; Refill: 3  Colon cancer screening -     Ambulatory referral to Gastroenterology  Atrial fibrillation with RVR [facility]) Assessment & Plan: Chronic.  Rate controlled Amiodarone, Eliquis and Metoprolol recently discontinued by Cardiology Now on Xarelto 20 mg daily Refill Carvedilol 6.25 mg BID  Awaiting PA for Multaq Follow-up with cardiology as scheduled  Orders: -     Carvedilol; Take 1 tablet (6.25 mg total) by mouth 2 (two) times daily with a meal.  Dispense: 180 tablet; Refill: 3   PDMP reviewed  Return in about 3 months (around 09/10/2022) for PCP.  Dana Allan, MD

## 2022-06-10 NOTE — Patient Instructions (Addendum)
It was a pleasure meeting you today. Thank you for allowing me to take part in your health care.  Our goals for today as we discussed include:  Continue Ozempic 0.5 mg weekly Call me when you have taken 3 weeks of the 0.5 mg to let me know if you are tolerating medication Continue Lantus 35 units daily  Continue Carvedilol 6.25 mg 1 tablet 2 times a day Stop Metoprolol Continue Amlodipine 10 mg 1 tablet once a day Follow up with Cardiology for the new medication approval Multaq.  Continue Xarelto 20 mg daily  If you have any questions or concerns, please do not hesitate to call the office at [phone].  I look forward to our next visit and until then take care and stay safe.  Regards,   Dana Allan, MD   [facility]

## 2022-06-12 DIAGNOSIS — E1122 Type 2 diabetes mellitus with diabetic chronic kidney disease: Secondary | ICD-10-CM | POA: Insufficient documentation

## 2022-06-12 NOTE — Assessment & Plan Note (Addendum)
Status post cardioversion today.  Fatigued but otherwise doing well.  Rate controlled and regular. Continue current medications per cardiology Discontinued metoprolol Start carvedilol 6.25 mg BID as recommended  Follow-up with cardiology as scheduled

## 2022-06-12 NOTE — Assessment & Plan Note (Signed)
Chronic. No hypoglycemic events.  Tolerating Ozemoic at current dose Continue Lantus to 35 units a.m. and 10 units p.m. Continue Ozempic to 0.5 mg weekly, plan for increasing 1 mg in 4 weeks On statin Blood pressure not well-controlled.  Declines ACEi/ARB Endocrinology appointment has been scheduled Follow up in 2 weeks

## 2022-06-13 NOTE — Assessment & Plan Note (Signed)
Chronic On statin and tolerating well Continue Lipitor 80 mg daily Check fasting lipids at next visit

## 2022-06-13 NOTE — Assessment & Plan Note (Signed)
Chronic. No hypoglycemic events.   Continue Lantus to 35 units a.m. self discontinued pm dose 10 units Continue Ozempic to 0.5 mg weekly, likely will need increase A1c due in July Endocrinology appointment Jun 10

## 2022-06-13 NOTE — Assessment & Plan Note (Signed)
Chronic.  Well controlled. Refill amlodipine 10 mg nightly Refill Carvedilol 6.25 mg BID Metoprolol XL 50 mg discontinued Follow-up with cardiology as scheduled

## 2022-06-13 NOTE — Assessment & Plan Note (Signed)
Chronic.  Rate controlled Amiodarone, Eliquis and Metoprolol recently discontinued by Cardiology Now on Xarelto 20 mg daily Refill Carvedilol 6.25 mg BID  Awaiting PA for Multaq Follow-up with cardiology as scheduled

## 2022-06-14 ENCOUNTER — Other Ambulatory Visit: Payer: Self-pay | Admitting: Family Medicine

## 2022-06-14 ENCOUNTER — Telehealth: Payer: Self-pay | Admitting: Family Medicine

## 2022-06-14 ENCOUNTER — Telehealth: Payer: Self-pay | Admitting: *Deleted

## 2022-06-14 DIAGNOSIS — E1165 Type 2 diabetes mellitus with hyperglycemia: Secondary | ICD-10-CM

## 2022-06-14 NOTE — Telephone Encounter (Signed)
Patient needs the follow  medications transferred to new pharmacy;  Prescription Request  06/14/2022  LOV: 06/10/2022  What is the name of the medication or equipment? pantoprazole (PROTONIX) 40 MG tablet, amLODipine (NORVASC) 10 MG tablet, and  atorvastatin (LIPITOR) 80 MG tablet., She needs all new refills .     Have you contacted your pharmacy to request a refill? No   Which pharmacy would you like this sent to?  [id number] - [place], [place] - [address] [place] [address] Phone: [phone] Fax: [phone]    Patient notified that their request is being sent to the clinical staff for review and that they should receive a response within 2 business days.   Please advise at [facility] [phone]

## 2022-06-14 NOTE — Telephone Encounter (Signed)
Pt as been informed that her prescriptions were sent on 04/13/22 90 supply with refills to [facility] she should be able to reach out to her pharmacy to get them filled. Pt verbalized understanding and stated she will reach out to them.

## 2022-06-14 NOTE — Progress Notes (Signed)
  Care Coordination Note  06/14/2022 Name: [name] [name] MRN: [id number] DOB: [birth year]  [name] [name] is a 72 y.o. year old female who is a primary care patient of Dana Allan, MD and is actively engaged with the care management team. I reached out to [name] by phone today to assist with re-scheduling an initial visit with the BSW  Follow up plan: Patient declines further follow up and engagement by the care management team. Appropriate care team members and provider have been notified via electronic communication.   [facility]  Care Coordination Care Guide  Direct Dial: [phone]

## 2022-06-18 ENCOUNTER — Telehealth: Payer: Self-pay | Admitting: Family Medicine

## 2022-06-18 NOTE — Telephone Encounter (Signed)
Amira from [employer] transportation called stating pt ned [place] of [place] form for [place] that was sent filled out. Amira will fax the form to [place]

## 2022-06-18 NOTE — Patient Instructions (Signed)

## 2022-06-18 NOTE — Telephone Encounter (Signed)
Form printed & placed in Dr. Clent Ridges folder for signature

## 2022-06-21 ENCOUNTER — Ambulatory Visit (INDEPENDENT_AMBULATORY_CARE_PROVIDER_SITE_OTHER): Payer: Medicare Other | Admitting: Nurse Practitioner

## 2022-06-21 ENCOUNTER — Encounter: Payer: Self-pay | Admitting: Nurse Practitioner

## 2022-06-21 VITALS — BP 126/82 | HR 116 | Ht 63.0 in | Wt 199.6 lb

## 2022-06-21 DIAGNOSIS — E1165 Type 2 diabetes mellitus with hyperglycemia: Secondary | ICD-10-CM

## 2022-06-21 DIAGNOSIS — Z7985 Long-term (current) use of injectable non-insulin antidiabetic drugs: Secondary | ICD-10-CM

## 2022-06-21 DIAGNOSIS — Z794 Long term (current) use of insulin: Secondary | ICD-10-CM | POA: Diagnosis not present

## 2022-06-21 MED ORDER — FREESTYLE LIBRE 3 SENSOR MISC: 3 refills | Status: DC

## 2022-06-21 MED ORDER — BD PEN NEEDLE MICRO U/F 32G X 6 MM MISC
3 refills | Status: DC
Start: 2022-06-21 — End: 2023-02-01

## 2022-06-21 MED ORDER — OZEMPIC (0.25 OR 0.5 MG/DOSE) 2 MG/1.5ML ~~LOC~~ SOPN
0.5000 mg | PEN_INJECTOR | SUBCUTANEOUS | 3 refills | Status: DC
Start: 2022-06-21 — End: 2022-08-10

## 2022-06-21 MED ORDER — FREESTYLE LIBRE 3 READER DEVI: 1.0000 | Freq: Once | 0 refills | Status: AC

## 2022-06-21 MED ORDER — LANTUS SOLOSTAR 100 UNIT/ML ~~LOC~~ SOPN: 20.0000 [IU] | PEN_INJECTOR | Freq: Every day | SUBCUTANEOUS | 3 refills | Status: DC

## 2022-06-21 MED ORDER — ACCU-CHEK GUIDE ME W/DEVICE KIT: PACK | 0 refills | Status: DC

## 2022-06-21 NOTE — Progress Notes (Signed)
Endocrinology Consult Note       06/21/2022, 10:23 AM   Subjective:    Patient ID: [name] [name], female    DOB: [birth year].  [name] [name] is being seen in consultation for management of currently uncontrolled symptomatic diabetes requested by  Dana Allan, MD.   Past Medical History:  Diagnosis Date   Abdominal pain    Abnormal chest x-ray 02/08/2021   Acute respiratory failure with hypoxia (HCC)    Arthritis    Atrial flutter (HCC)    Atrial tachycardia 07/29/2019   Back pain    Back pain 11/15/2014   Bilateral leg edema 03/18/2020   Blood transfusion without reported diagnosis    Chronic anticoagulation - on Eliquis for chronic afib 02/04/2020   Chronic combined systolic and diastolic CHF (congestive heart failure) (HCC) 02/04/2020   Chronic heart failure with preserved ejection fraction (HFpEF) (HCC) 03/18/2020   LVEF less than 20% (May 2022), LVEF 55 to 60% (September 2022)   Constipation    Contact dermatitis 08/12/2020   COVID-19 02/04/2020   Diabetes mellitus without complication (HCC)    Diabetes mellitus without complication (HCC)    Dysphagia 02/03/2020   Epigastric pain 02/03/2020   Fatigue    Fatigue    Full dentures    Full dentures    GERD (gastroesophageal reflux disease)    Hemorrhoids    History of esophageal stricture 07/29/2019   History of noncompliance with medical treatment, presenting hazards to health    Hospital discharge follow-up 07/27/2020   Hyperlipidemia    Hypertension    Intertrigo 09/04/2020   Lack of adequate sleep    Lack of adequate sleep    Liver mass    Liver mass    Morbid obesity (HCC) 02/03/2020   Nonrheumatic tricuspid valve regurgitation 03/18/2020   Oral thrush 02/04/2020   Osteopenia after menopause 06/09/2017   PAF (paroxysmal atrial fibrillation) (HCC)    Pericardial effusion 05/12/2010   Pleural effusion 06/07/2020    Pleural effusion on left    QT prolongation    Rash    on right breast   Right upper quadrant abdominal pain    Stroke (cerebrum) (HCC) 05/12/2020   Uncontrolled type 2 diabetes mellitus with hyperglycemia (HCC) 06/08/2017   Wears glasses    Wears glasses     Past Surgical History:  Procedure Laterality Date   BREAST DUCTAL SYSTEM EXCISION     BREAST EXCISIONAL BIOPSY Left    CARDIAC SURGERY     CARDIOVERSION N/A 07/31/2019   Procedure: CARDIOVERSION;  Surgeon: Chrystie Nose, MD;  Location: [facility] ENDOSCOPY;  Service: Cardiovascular;  Laterality: N/A;   CARDIOVERSION N/A 05/19/2020   Procedure: CARDIOVERSION;  Surgeon: Dolores Patty, MD;  Location: [facility] ENDOSCOPY;  Service: Cardiovascular;  Laterality: N/A;   CESAREAN SECTION     CHOLECYSTECTOMY     CHOLECYSTECTOMY, LAPAROSCOPIC  07/24/2010   ESOPHAGEAL DILATION     IR CT HEAD LTD  05/12/2020   IR PERCUTANEOUS ART THROMBECTOMY/INFUSION INTRACRANIAL INC DIAG ANGIO  05/12/2020       IR PERCUTANEOUS ART THROMBECTOMY/INFUSION INTRACRANIAL INC DIAG ANGIO  05/12/2020   IR US GUIDE VASC ACCESS RIGHT  05/12/2020  RADIOLOGY WITH ANESTHESIA N/A 05/12/2020   Procedure: IR WITH ANESTHESIA;  Surgeon: Radiologist, Medication, MD;  Location: [facility] OR;  Service: Radiology;  Laterality: N/A;   TEE WITHOUT CARDIOVERSION N/A 07/31/2019   Procedure: TRANSESOPHAGEAL ECHOCARDIOGRAM (TEE);  Surgeon: Chrystie Nose, MD;  Location: [facility] ENDOSCOPY;  Service: Cardiovascular;  Laterality: N/A;   TEE WITHOUT CARDIOVERSION N/A 05/19/2020   Procedure: TRANSESOPHAGEAL ECHOCARDIOGRAM (TEE);  Surgeon: Dolores Patty, MD;  Location: [facility] ENDOSCOPY;  Service: Cardiovascular;  Laterality: N/A;    Social History   Socioeconomic History   Marital status: Single    Spouse name: Not on file   Number of children: 3   Years of education: Not on file   Highest education level: High school graduate  Occupational History   Occupation: Retired    Associate Professor: OTHER     Comment: CNA  Tobacco Use   Smoking status: Never    Passive exposure: Never   Smokeless tobacco: Never  Vaping Use   Vaping Use: Never used  Substance and Sexual Activity   Alcohol use: No    Alcohol/week: 0.0 standard drinks of alcohol   Drug use: No   Sexual activity: Not Currently  Other Topics Concern   Not on file  Social History Narrative   Patient lives alone in an apartment complex for seniors.   Patient does not drive. She walks to surrounding places near her complex or her children run errands for her. Patient never goes without.   Patient enjoys sewing, crafts, shopping, and walking.    Patient has 3 children and she is close with all of them.        Social Determinants of Health   Financial Resource Strain: Low Risk  (05/14/2022)   Overall Financial Resource Strain (CARDIA)    Difficulty of Paying Living Expenses: Not hard at all  Food Insecurity: No Food Insecurity (05/14/2022)   Hunger Vital Sign    Worried About Running Out of Food in the Last Year: Never true    Ran Out of Food in the Last Year: Never true  Transportation Needs: No Transportation Needs (05/14/2022)   PRAPARE - Administrator, Civil Service (Medical): No    Lack of Transportation (Non-Medical): No  Physical Activity: Insufficiently Active (05/14/2022)   Exercise Vital Sign    Days of Exercise per Week: 2 days    Minutes of Exercise per Session: 30 min  Stress: No Stress Concern Present (05/14/2022)   [employer] of Occupational Health - Occupational Stress Questionnaire    Feeling of Stress : Not at all  Social Connections: Socially Isolated (05/14/2022)   Social Connection and Isolation Panel [NHANES]    Frequency of Communication with Friends and Family: More than three times a week    Frequency of Social Gatherings with Friends and Family: Once a week    Attends Religious Services: Never    Database administrator or Organizations: No    Attends Engineer, structural:  Never    Marital Status: Never married    Family History  Problem Relation Age of Onset   Stroke Mother    Alcohol abuse Father    Other Father        broken heart after spouse spouse   Diabetes Sister    Hypertension Sister    Diabetes Sister    Multiple sclerosis Sister    Miscarriages / [place] Daughter    Drug abuse Daughter    Alcohol abuse Other  Diabetes Other    Breast cancer Neg Hx     Outpatient Encounter Medications as of 06/21/2022  Medication Sig   acetaminophen (TYLENOL) 650 MG CR tablet Take 1 tablet (650 mg total) by mouth every 8 (eight) hours as needed for pain.   amLODipine (NORVASC) 10 MG tablet Take 1 tablet (10 mg total) by mouth at bedtime.   atorvastatin (LIPITOR) 80 MG tablet TAKE 1 TABLET(80 MG) BY MOUTH DAILY   blood glucose meter kit and supplies Dispense based on patient and insurance preference. Use up to four times daily as directed. (FOR ICD-10 E10.9, E11.9).   Blood Glucose Monitoring Suppl (ACCU-CHEK GUIDE ME) w/Device KIT Use to check glucose twice daily   carvedilol (COREG) 6.25 MG tablet Take 1 tablet (6.25 mg total) by mouth 2 (two) times daily with a meal.   Continuous Glucose Receiver (FREESTYLE LIBRE 3 READER) DEVI 1 Device by Does not apply route once for 1 dose.   magnesium oxide (MAG-OX) 400 MG tablet Take by mouth.   pantoprazole (PROTONIX) 40 MG tablet TAKE 1 TABLET(40 MG) BY MOUTH DAILY   rivaroxaban (XARELTO) 20 MG TABS tablet Take 1 tablet (20 mg total) by mouth daily with supper.   [DISCONTINUED] BD PEN NEEDLE MICRO U/F 32G X 6 MM MISC USE AS DIRECTED   [DISCONTINUED] insulin glargine (LANTUS SOLOSTAR) 100 UNIT/ML Solostar Pen ADMINISTER 35 UNITS UNDER THE SKIN in am   [DISCONTINUED] Semaglutide,0.25 or 0.5MG /DOS, (OZEMPIC, 0.25 OR 0.5 MG/DOSE,) 2 MG/1.5ML SOPN Inject 0.5 mg into the skin once a week.   Continuous Blood Gluc Sensor (FREESTYLE LIBRE 2 SENSOR) MISC Apply 1 to skin every 14 days (Patient not taking: Reported on  06/21/2022)   Continuous Glucose Sensor (FREESTYLE LIBRE 3 SENSOR) MISC Place 1 sensor on the skin every 14 days. Use to check glucose continuously   dronedarone (MULTAQ) 400 MG tablet Take 1 tablet (400 mg total) by mouth 2 (two) times daily with a meal. (Patient not taking: Reported on 06/10/2022)   insulin glargine (LANTUS SOLOSTAR) 100 UNIT/ML Solostar Pen Inject 20 Units into the skin at bedtime. ADMINISTER 35 UNITS UNDER THE SKIN in am   Insulin Pen Needle (BD PEN NEEDLE MICRO U/F) 32G X 6 MM MISC Use to inject insulin daily   Semaglutide,0.25 or 0.5MG /DOS, (OZEMPIC, 0.25 OR 0.5 MG/DOSE,) 2 MG/1.5ML SOPN Inject 0.5 mg into the skin once a week.   [DISCONTINUED] Continuous Glucose Sensor (FREESTYLE LIBRE 3 SENSOR) MISC Place 1 sensor on the skin every 14 days. Use to check glucose continuously (Patient not taking: Reported on 06/21/2022)   No facility-administered encounter medications on file as of 06/21/2022.    ALLERGIES: Allergies  Allergen Reactions   Food Shortness Of Breath, Swelling and Other (See Comments)    Fruit - tongue swelling/numbness (ok with organic fruit) Tree nuts - tongue swelling/numbness (ok with organic nuts)   Pacerone [Amiodarone] Shortness Of Breath and Other (See Comments)    Myalgias  Wheezing  Asthenia    Peanut (Diagnostic) Shortness Of Breath and Swelling    Tongue swelling/numbness   Toprol Xl [Metoprolol] Shortness Of Breath, Other (See Comments) and Cough    Myalgias Wheezing Asthenia    Asa [Aspirin] Nausea And Vomiting and Other (See Comments)    Told to avoid due to liver issues   Lactose Intolerance (Gi) Diarrhea and Nausea And Vomiting    VACCINATION STATUS: Immunization History  Administered Date(s) Administered   Fluad Quad(high Dose 65+) 09/28/2019   Influenza,inj,Quad PF,6+ Mos  08/26/2016, 09/06/2017   Moderna Sars-Covid-2 Vaccination 07/10/2019, 08/07/2019   Pneumococcal Conjugate-13 05/13/2017   Pneumococcal Polysaccharide-23  11/09/2019   Tdap 05/13/2017   Zoster Recombinat (Shingrix) 05/16/2017, 07/16/2017    Diabetes She presents for her initial diabetic visit. She has type 2 diabetes mellitus. Onset time: diagnosed at approx age of 40. Hypoglycemia symptoms include hunger, nervousness/anxiousness, sweats and tremors. Associated symptoms include blurred vision and weight loss. Hypoglycemia complications include nocturnal hypoglycemia. Symptoms are stable. Diabetic complications include heart disease, nephropathy and retinopathy. Risk factors for coronary artery disease include diabetes mellitus, dyslipidemia, family history, hypertension, sedentary lifestyle and post-menopausal. Current diabetic treatment includes insulin injections (and Ozempic (recently started)). She is compliant with treatment most of the time. Her weight is decreasing steadily. She is following a generally unhealthy diet. When asked about meal planning, she reported none. She has not had a previous visit with a dietitian. She rarely participates in exercise. (She presents today for her consultation with no meter or logs to review.  She has script for [facility] 3 but no reader to go with it and has not been checking fingersticks at home.  Her most recent A1c on 4/2 was 10.9%.  She drinks coffee (with sugar), SF Gatorade, and diet soda along with her water.  She eats 3 meals per day with snacks between.  She does not engage in routine exercise but does have plans to start stationary bike.  She is UTD on eye exam, sees podiatry routinely.) An ACE inhibitor/angiotensin II receptor blocker is not being taken. She sees a podiatrist.Eye exam is current.     Review of systems  Constitutional: + decreasing body weight, current Body mass index is 35.36 kg/m., no fatigue, no subjective hyperthermia, no subjective hypothermia Eyes: + blurry vision (having cataracts corrected soon), no xerophthalmia ENT: no sore throat, no nodules palpated in throat, no  dysphagia/odynophagia, no hoarseness Cardiovascular: no chest pain, no shortness of breath, + intermittent palpitations- hx of afib, no leg swelling Respiratory: no cough, no shortness of breath Gastrointestinal: no nausea/vomiting/diarrhea Musculoskeletal: no muscle/joint aches, walks with walker Skin: no rashes, no hyperemia Neurological: no tremors, no numbness, no tingling, no dizziness Psychiatric: no depression, no anxiety  Objective:     BP 126/82 (BP Location: Right Arm, Patient Position: Sitting, Cuff Size: Large)   Pulse (!) 116   Ht 5\' 3"  (1.6 m)   Wt 199 lb 9.6 oz (90.5 kg)   BMI 35.36 kg/m   Wt Readings from Last 3 Encounters:  06/21/22 199 lb 9.6 oz (90.5 kg)  06/10/22 200 lb (90.7 kg)  06/03/22 204 lb (92.5 kg)     BP Readings from Last 3 Encounters:  06/21/22 126/82  06/10/22 126/68  06/03/22 116/79     Physical Exam- Limited  Constitutional:  Body mass index is 35.36 kg/m. , not in acute distress, normal state of mind Eyes:  EOMI, no exophthalmos Neck: Supple Cardiovascular: tachycardic, normal rhythm, no murmurs, rubs, or gallops, no edema Respiratory: Adequate breathing efforts, no crackles, rales, rhonchi, or wheezing Musculoskeletal: no gross deformities, strength intact in all four extremities, no gross restriction of joint movements, walks with walker Skin:  no rashes, no hyperemia Neurological: no tremor with outstretched hands    CMP ( most recent) CMP     Component Value Date/Time   NA 133 (L) 05/27/2022 0640   NA 140 05/11/2021 1635   K 4.5 05/27/2022 0640   CL 103 05/27/2022 0640   CO2 19 (L) 05/27/2022 1610  GLUCOSE 179 (H) 05/27/2022 0640   BUN 19 05/27/2022 0640   BUN 18 05/11/2021 1635   CREATININE 1.03 (H) 05/27/2022 0640   CALCIUM 8.8 (L) 05/27/2022 0640   PROT 7.9 05/27/2022 0640   PROT 7.2 12/10/2020 1211   ALBUMIN 3.3 (L) 05/27/2022 0640   ALBUMIN 4.2 12/10/2020 1211   AST 27 05/27/2022 0640   ALT 49 (H) 05/27/2022  0640   ALKPHOS 104 05/27/2022 0640   BILITOT 0.5 05/27/2022 0640   BILITOT 0.2 12/10/2020 1211   GFR 45.00 (L) 04/22/2022 1648   EGFR 59 (L) 05/11/2021 1635   GFRNONAA 58 (L) 05/27/2022 0640     Diabetic Labs (most recent): Lab Results  Component Value Date   HGBA1C 10.9 (H) 04/13/2022   HGBA1C 10.2 (H) 12/19/2021   HGBA1C 12.5 (A) 05/11/2021   MICROALBUR 80 09/06/2017     Lipid Panel ( most recent) Lipid Panel     Component Value Date/Time   CHOL 147 05/11/2021 1635   TRIG 125 05/11/2021 1635   HDL 41 05/11/2021 1635   CHOLHDL 3.6 05/11/2021 1635   CHOLHDL 5.1 05/13/2020 0527   VLDL 9 05/13/2020 0527   LDLCALC 84 05/11/2021 1635   LABVLDL 22 05/11/2021 1635      Lab Results  Component Value Date   TSH 2.727 02/05/2022   TSH 3.080 09/10/2020   TSH 1.420 05/13/2020   TSH 2.436 02/04/2020   TSH 2.320 07/26/2019           Assessment & Plan:   1) Type 2 diabetes mellitus with hyperglycemia, with long-term current use of insulin (HCC)  She presents today for her consultation with no meter or logs to review.  She has script for [facility] 3 but no reader to go with it and has not been checking fingersticks at home.  Her most recent A1c on 4/2 was 10.9%.  She drinks coffee (with sugar), SF Gatorade, and diet soda along with her water.  She eats 3 meals per day with snacks between.  She does not engage in routine exercise but does have plans to start stationary bike.  She is UTD on eye exam, sees podiatry routinely.  - [name] [name] has currently uncontrolled symptomatic type 2 DM since 72 years of age, with most recent A1c of 10.9 %.   -Recent labs reviewed.  - I had a long discussion with her about the progressive nature of diabetes and the pathology behind its complications. -her diabetes is complicated by retinopathy, CKD, Afib and she remains at a high risk for more acute and chronic complications which include CAD, CVA, CKD, retinopathy, and neuropathy.  These are all discussed in detail with her.  The following Lifestyle Medicine recommendations according to American College of Lifestyle Medicine [facility]) were discussed and offered to patient and she agrees to start the journey:  A. Whole Foods, Plant-based plate comprising of fruits and vegetables, plant-based proteins, whole-grain carbohydrates was discussed in detail with the patient.   A list for source of those nutrients were also provided to the patient.  Patient will use only water or unsweetened tea for hydration. B.  The need to stay away from risky substances including alcohol, smoking; obtaining 7 to 9 hours of restorative sleep, at least 150 minutes of moderate intensity exercise weekly, the importance of healthy social connections,  and stress reduction techniques were discussed. C.  A full color page of  Calorie density of various food groups per pound showing examples of each food  groups was provided to the patient.  - I have counseled her on diet and weight management by adopting a carbohydrate restricted/protein rich diet. Patient is encouraged to switch to unprocessed or minimally processed complex starch and increased protein intake (animal or plant source), fruits, and vegetables. -  she is advised to stick to a routine mealtimes to eat 3 meals a day and avoid unnecessary snacks (to snack only to correct hypoglycemia).   - she acknowledges that there is a room for improvement in her food and drink choices. - Suggestion is made for her to avoid simple carbohydrates from her diet including Cakes, Sweet Desserts, Ice Cream, Soda (diet and regular), Sweet Tea, Candies, Chips, Cookies, Store Bought Juices, Alcohol in Excess of 1-2 drinks a day, Artificial Sweeteners, Coffee Creamer, and "Sugar-free" Products. This will help patient to have more stable blood glucose profile and potentially avoid unintended weight gain.  - I have approached her with the following individualized plan to  manage her diabetes and patient agrees:   -She is advised to lower her Lantus to 20 units SQ nightly (changing from am administration).  She can continue her Ozempic 0.5 mg SQ weekly, not only will help with her DM and weight but also prevention of CV events.  -she is encouraged to start monitoring glucose at least 2 times daily (using her CGM- I did send in reader for her), before breakfast and before bed.  I will also send in back up meter for her.  - she is warned not to take insulin without proper monitoring per orders. - Adjustment parameters are given to her for hypo and hyperglycemia in writing. - she is encouraged to call clinic for blood glucose levels less than 70 or above 300 mg /dl.  - she is not a candidate for Metformin due to concurrent renal insufficiency.  - Specific targets for  A1c; LDL, HDL, and Triglycerides were discussed with the patient.  2) Blood Pressure /Hypertension:  her blood pressure is controlled to target.   she is advised to continue her current medications including Norvasc 10 mg p.o. daily with breakfast, Coreg 6.25 mg po twice daily.  3) Lipids/Hyperlipidemia:    Review of her recent lipid panel from 05/11/21 showed controlled LDL at 84 .  she is advised to continue Lipitor 80 mg daily at bedtime.  Side effects and precautions discussed with her.  4)  Weight/Diet:  her Body mass index is 35.36 kg/m.  -  clearly complicating her diabetes care.   she is a candidate for weight loss. I discussed with her the fact that loss of 5 - 10% of her  current body weight will have the most impact on her diabetes management.  Exercise, and detailed carbohydrates information provided  -  detailed on discharge instructions.  5) Chronic Care/Health Maintenance: -she is not on ACEI/ARB and is on Statin medications and is encouraged to initiate and continue to follow up with Ophthalmology, Dentist, Podiatrist at least yearly or according to recommendations, and advised to stay  away from smoking. I have recommended yearly flu vaccine and pneumonia vaccine at least every 5 years; moderate intensity exercise for up to 150 minutes weekly; and sleep for at least 7 hours a day.  - she is advised to maintain close follow up with Dana Allan, MD for primary care needs, as well as her other providers for optimal and coordinated care.   - Time spent in this patient care: 60 min, of which > 50% was  spent in counseling her about her diabetes and the rest reviewing her blood glucose logs, discussing her hypoglycemia and hyperglycemia episodes, reviewing her current and previous labs/studies (including abstraction from other facilities) and medications doses and developing a long term treatment plan based on the latest standards of care/guidelines; and documenting her care.    Please refer to Patient Instructions for Blood Glucose Monitoring and Insulin/Medications Dosing Guide" in media tab for additional information. Please also refer to "Patient Self Inventory" in the Media tab for reviewed elements of pertinent patient history.  [name] participated in the discussions, expressed understanding, and voiced agreement with the above plans.  All questions were answered to her satisfaction. she is encouraged to contact clinic should she have any questions or concerns prior to her return visit.     Follow up plan: - Return in about 3 months (around 09/21/2022) for Diabetes F/U with A1c in office, No previsit labs, Bring meter and logs.    Ronny Bacon, [facility] [address] [place], [place] [address] Phone: [phone] Fax: [phone]  06/21/2022, 10:23 AM

## 2022-06-22 ENCOUNTER — Encounter: Payer: Self-pay | Admitting: Cardiology

## 2022-06-22 ENCOUNTER — Ambulatory Visit: Payer: Medicare Other | Admitting: Cardiology

## 2022-06-22 VITALS — BP 132/76 | HR 105 | Resp 16 | Ht 63.0 in | Wt 200.0 lb

## 2022-06-22 DIAGNOSIS — I10 Essential (primary) hypertension: Secondary | ICD-10-CM

## 2022-06-22 DIAGNOSIS — I4819 Other persistent atrial fibrillation: Secondary | ICD-10-CM

## 2022-06-22 DIAGNOSIS — Z7901 Long term (current) use of anticoagulants: Secondary | ICD-10-CM

## 2022-06-22 MED ORDER — CARVEDILOL 12.5 MG PO TABS
12.5000 mg | ORAL_TABLET | Freq: Two times a day (BID) | ORAL | 1 refills | Status: DC
Start: 2022-06-22 — End: 2022-08-10

## 2022-06-22 NOTE — Progress Notes (Signed)
Primary Physician/Referring:  Dana Allan, MD  Patient ID: [name] [name], female    DOB: 01-Sep-1950, 72 y.o.   MRN: [id number]  Chief Complaint  Patient presents with   Paroxysmal A-fib    Chronic heart failure with preserved ejection fraction (HFp   Follow-up    6 months   HPI:    [name] [name]  is a 72 y.o. African-American female patient with regard cardiomyopathy from EF 20% sometime in the 2022, normalized on medical therapy by September 2 EF 55 to 60%, persistent atrial fibrillation/atypical atrial flutter, history of embolic stroke SP tPA and thrombectomy 05/12/2020, Patient was sent sinus rhythm last in December 2022, developed recurrence of atrial fibrillation in January 2023.  Past medical history also significant for primary hypertension, hypercholesterolemia, diabetes mellitus presents here for 6-week office visit.  Patient states that since starting Ozempic she is feeling the best she has in quite a while.  She has lost about 20 pounds in weight over the past 4 to 6 weeks.  She denies palpitations, dizziness or syncope, chest pain or or dyspnea.  She is planning on a cruise next week.  Past Medical History:  Diagnosis Date   Abdominal pain    Abnormal chest x-ray 02/08/2021   Acute respiratory failure with hypoxia (HCC)    Arthritis    Atrial flutter (HCC)    Atrial tachycardia 07/29/2019   Back pain    Back pain 11/15/2014   Bilateral leg edema 03/18/2020   Blood transfusion without reported diagnosis    Chronic anticoagulation - on Eliquis for chronic afib 02/04/2020   Chronic combined systolic and diastolic CHF (congestive heart failure) (HCC) 02/04/2020   Chronic heart failure with preserved ejection fraction (HFpEF) (HCC) 03/18/2020   LVEF less than 20% (May 2022), LVEF 55 to 60% (September 2022)   Constipation    Contact dermatitis 08/12/2020   COVID-19 02/04/2020   Diabetes mellitus without complication (HCC)    Diabetes mellitus without  complication (HCC)    Dysphagia 02/03/2020   Epigastric pain 02/03/2020   Fatigue    Fatigue    Full dentures    Full dentures    GERD (gastroesophageal reflux disease)    Hemorrhoids    History of esophageal stricture 07/29/2019   History of noncompliance with medical treatment, presenting hazards to health    Hospital discharge follow-up 07/27/2020   Hyperlipidemia    Hypertension    Intertrigo 09/04/2020   Lack of adequate sleep    Lack of adequate sleep    Liver mass    Liver mass    Morbid obesity (HCC) 02/03/2020   Nonrheumatic tricuspid valve regurgitation 03/18/2020   Oral thrush 02/04/2020   Osteopenia after menopause 06/09/2017   PAF (paroxysmal atrial fibrillation) (HCC)    Pericardial effusion 05/12/2010   Pleural effusion 06/07/2020   Pleural effusion on left    QT prolongation    Rash    on right breast   Right upper quadrant abdominal pain    Stroke (cerebrum) (HCC) 05/12/2020   Uncontrolled type 2 diabetes mellitus with hyperglycemia (HCC) 06/08/2017   Wears glasses    Wears glasses    Past Surgical History:  Procedure Laterality Date   BREAST DUCTAL SYSTEM EXCISION     BREAST EXCISIONAL BIOPSY Left    CARDIAC SURGERY     CARDIOVERSION N/A 07/31/2019   Procedure: CARDIOVERSION;  Surgeon: Chrystie Nose, MD;  Location: [facility] ENDOSCOPY;  Service: Cardiovascular;  Laterality: N/A;   CARDIOVERSION N/A  05/19/2020   Procedure: CARDIOVERSION;  Surgeon: Dolores Patty, MD;  Location: [facility] ENDOSCOPY;  Service: Cardiovascular;  Laterality: N/A;   CESAREAN SECTION     CHOLECYSTECTOMY     CHOLECYSTECTOMY, LAPAROSCOPIC  07/24/2010   ESOPHAGEAL DILATION     IR CT HEAD LTD  05/12/2020   IR PERCUTANEOUS ART THROMBECTOMY/INFUSION INTRACRANIAL INC DIAG ANGIO  05/12/2020       IR PERCUTANEOUS ART THROMBECTOMY/INFUSION INTRACRANIAL INC DIAG ANGIO  05/12/2020   IR US GUIDE VASC ACCESS RIGHT  05/12/2020   RADIOLOGY WITH ANESTHESIA N/A 05/12/2020   Procedure: IR WITH  ANESTHESIA;  Surgeon: Radiologist, Medication, MD;  Location: [facility] OR;  Service: Radiology;  Laterality: N/A;   TEE WITHOUT CARDIOVERSION N/A 07/31/2019   Procedure: TRANSESOPHAGEAL ECHOCARDIOGRAM (TEE);  Surgeon: Chrystie Nose, MD;  Location: [facility] ENDOSCOPY;  Service: Cardiovascular;  Laterality: N/A;   TEE WITHOUT CARDIOVERSION N/A 05/19/2020   Procedure: TRANSESOPHAGEAL ECHOCARDIOGRAM (TEE);  Surgeon: Dolores Patty, MD;  Location: [facility] ENDOSCOPY;  Service: Cardiovascular;  Laterality: N/A;   Family History  Problem Relation Age of Onset   Stroke Mother    Alcohol abuse Father    Other Father        broken heart after spouse spouse   Diabetes Sister    Hypertension Sister    Diabetes Sister    Multiple sclerosis Sister    Miscarriages / [place] Daughter    Drug abuse Daughter    Alcohol abuse Other    Diabetes Other    Breast cancer Neg Hx     Social History   Tobacco Use   Smoking status: Never    Passive exposure: Never   Smokeless tobacco: Never  Substance Use Topics   Alcohol use: No    Alcohol/week: 0.0 standard drinks of alcohol   Marital Status: Single  ROS  Review of Systems  Cardiovascular:  Negative for chest pain, dyspnea on exertion and leg swelling.   Objective      06/22/2022    3:09 PM 06/21/2022    9:22 AM 06/10/2022    4:13 PM  Vitals with BMI  Height 5\' 3"  5\' 3"  5\' 3"   Weight 200 lbs 199 lbs 10 oz 200 lbs  BMI 35.44 35.37 35.44  Systolic 132 126 409  Diastolic 76 82 68  Pulse 105 116 104   Blood pressure 132/76, pulse (!) 105, resp. rate 16, height 5\' 3"  (1.6 m), weight 200 lb (90.7 kg), SpO2 93 %.   Physical Exam Neck:     Vascular: No carotid bruit or JVD.  Cardiovascular:     Rate and Rhythm: Normal rate. Rhythm irregular.     Pulses: Normal pulses and intact distal pulses.     Heart sounds: No murmur heard. Pulmonary:     Effort: Pulmonary effort is normal.     Breath sounds: Normal breath sounds.  Abdominal:     General:  Bowel sounds are normal.     Palpations: Abdomen is soft.  Musculoskeletal:     Right lower leg: No edema.     Left lower leg: No edema.  Skin:    Capillary Refill: Capillary refill takes less than 2 seconds.     Laboratory examination:   Recent Labs    02/05/22 2255 04/13/22 1449 04/22/22 1648 05/13/22 1924 05/27/22 0640  NA 135   < > 140 137 133*  K 3.7   < > 4.2 3.6 4.5  CL 104   < > 100 103 103  CO2 22   < > 27 25 19*  GLUCOSE 254*   < > 171* 105* 179*  BUN 23   < > 26* 26* 19  CREATININE 1.04*   < > 1.22* 1.19* 1.03*  CALCIUM 8.9   < > 9.3 8.9 8.8*  GFRNONAA 58*  --   --  49* 58*   < > = values in this interval not displayed.    Lab Results  Component Value Date   GLUCOSE 179 (H) 05/27/2022   NA 133 (L) 05/27/2022   K 4.5 05/27/2022   CL 103 05/27/2022   CO2 19 (L) 05/27/2022   BUN 19 05/27/2022   CREATININE 1.03 (H) 05/27/2022   GFRNONAA 58 (L) 05/27/2022   CALCIUM 8.8 (L) 05/27/2022   PHOS 3.4 06/19/2020   PROT 7.9 05/27/2022   ALBUMIN 3.3 (L) 05/27/2022   LABGLOB 3.0 12/10/2020   AGRATIO 1.4 12/10/2020   BILITOT 0.5 05/27/2022   ALKPHOS 104 05/27/2022   AST 27 05/27/2022   ALT 49 (H) 05/27/2022   ANIONGAP 11 05/27/2022      Lab Results  Component Value Date   ALT 49 (H) 05/27/2022   AST 27 05/27/2022   ALKPHOS 104 05/27/2022   BILITOT 0.5 05/27/2022       Latest Ref Rng & Units 05/27/2022    6:40 AM 05/13/2022    7:24 PM 04/13/2022    2:49 PM  Hepatic Function  Total Protein 6.5 - 8.1 g/dL 7.9  7.1  7.6   Albumin 3.5 - 5.0 g/dL 3.3  2.9  4.1   AST 15 - 41 U/L 27  16  13    ALT 0 - 44 U/L 49  19  15   Alk Phosphatase 38 - 126 U/L 104  96  108   Total Bilirubin 0.3 - 1.2 mg/dL 0.5  0.4  0.3    Lab Results  Component Value Date   CHOL 147 05/11/2021   HDL 41 05/11/2021   LDLCALC 84 05/11/2021   TRIG 125 05/11/2021   CHOLHDL 3.6 05/11/2021     HEMOGLOBIN A1C Lab Results  Component Value Date   HGBA1C 10.9 (H) 04/13/2022   MPG 246  12/19/2021   TSH Recent Labs    02/05/22 2255  TSH 2.727   Radiology:    Cardiac Studies:    ECHOCARDIOGRAM COMPLETE 12/29/2021:  1. Left ventricular ejection fraction, by estimation, is 55 to 60%. The left ventricle has normal function. The left ventricle has no regional wall motion abnormalities. There is moderate concentric left ventricular hypertrophy. Left ventricular  diastolic parameters are consistent with Grade III diastolic dysfunction (restrictive).  2. Right ventricular systolic function is normal. The right ventricular size is normal. Tricuspid regurgitation signal is inadequate for assessing PA pressure.  3. A small pericardial effusion is present. There is no evidence of cardiac tamponade.  4. The mitral valve is grossly normal. Trivial mitral valve regurgitation. No evidence of mitral stenosis.  5. The aortic valve is tricuspid. Aortic valve regurgitation is not visualized. No aortic stenosis is present.  6. There is mild dilatation of the aortic root, measuring 41 mm.  7. The inferior vena cava is normal in size with greater than 50% respiratory variability, suggesting right atrial pressure of 3 mmHg.  EKG:   EKG 06/22/2022: Atrial tachycardia/atypical atrial flutter with 2: 1 conduction, ventricular rate 115 bpm, normal axis, nonspecific T abnormality.  Compared to 06/03/2022, no significant change.    EKG 08/28/2020, normal sinus rhythm  at rate of 74 bpm, diffuse inferolateral sagging ST segment depressions.   Medications and allergies   Allergies  Allergen Reactions   Food Shortness Of Breath, Swelling and Other (See Comments)    Fruit - tongue swelling/numbness (ok with organic fruit) Tree nuts - tongue swelling/numbness (ok with organic nuts)   Pacerone [Amiodarone] Shortness Of Breath and Other (See Comments)    Myalgias  Wheezing  Asthenia    Peanut (Diagnostic) Shortness Of Breath and Swelling    Tongue swelling/numbness   Toprol Xl [Metoprolol]  Shortness Of Breath, Other (See Comments) and Cough    Myalgias Wheezing Asthenia    Asa [Aspirin] Nausea And Vomiting and Other (See Comments)    Told to avoid due to liver issues   Lactose Intolerance (Gi) Diarrhea and Nausea And Vomiting     Medication list   Current Outpatient Medications:    acetaminophen (TYLENOL) 650 MG CR tablet, Take 1 tablet (650 mg total) by mouth every 8 (eight) hours as needed for pain., Disp: , Rfl:    amLODipine (NORVASC) 10 MG tablet, Take 1 tablet (10 mg total) by mouth at bedtime., Disp: 90 tablet, Rfl: 1   atorvastatin (LIPITOR) 80 MG tablet, TAKE 1 TABLET(80 MG) BY MOUTH DAILY, Disp: 90 tablet, Rfl: 2   blood glucose meter kit and supplies, Dispense based on patient and insurance preference. Use up to four times daily as directed. (FOR ICD-10 E10.9, E11.9)., Disp: 1 each, Rfl: 0   Blood Glucose Monitoring Suppl (ACCU-CHEK GUIDE ME) w/Device KIT, Use to check glucose twice daily, Disp: 1 kit, Rfl: 0   Continuous Blood Gluc Sensor (FREESTYLE LIBRE 2 SENSOR) MISC, Apply 1 to skin every 14 days, Disp: 1 each, Rfl: 5   Continuous Glucose Sensor (FREESTYLE LIBRE 3 SENSOR) MISC, Place 1 sensor on the skin every 14 days. Use to check glucose continuously, Disp: 6 each, Rfl: 3   insulin glargine (LANTUS SOLOSTAR) 100 UNIT/ML Solostar Pen, Inject 20 Units into the skin at bedtime. ADMINISTER 35 UNITS UNDER THE SKIN in am, Disp: 18 mL, Rfl: 3   Insulin Pen Needle (BD PEN NEEDLE MICRO U/F) 32G X 6 MM MISC, Use to inject insulin daily, Disp: 100 each, Rfl: 3   magnesium oxide (MAG-OX) 400 MG tablet, Take by mouth., Disp: , Rfl:    pantoprazole (PROTONIX) 40 MG tablet, TAKE 1 TABLET(40 MG) BY MOUTH DAILY, Disp: 90 tablet, Rfl: 2   rivaroxaban (XARELTO) 20 MG TABS tablet, Take 1 tablet (20 mg total) by mouth daily with supper., Disp: 30 tablet, Rfl: 10   Semaglutide,0.25 or 0.5MG /DOS, (OZEMPIC, 0.25 OR 0.5 MG/DOSE,) 2 MG/1.5ML SOPN, Inject 0.5 mg into the skin once a  week., Disp: 3 mL, Rfl: 3   carvedilol (COREG) 12.5 MG tablet, Take 1 tablet (12.5 mg total) by mouth 2 (two) times daily with a meal., Disp: 180 tablet, Rfl: 1  Assessment     ICD-10-CM   1. Persistent atrial fibrillation (HCC)  I48.19 EKG 12-Lead    carvedilol (COREG) 12.5 MG tablet    PCV MYOCARDIAL PERFUSION WITH LEXISCAN    2. Essential hypertension  I10     3. Long term (current) use of anticoagulants  Z79.01        Orders Placed This Encounter  Procedures   PCV MYOCARDIAL PERFUSION WITH LEXISCAN    Standing Status:   Future    Standing Expiration Date:   08/22/2022   EKG 12-Lead    Meds ordered this encounter  Medications  carvedilol (COREG) 12.5 MG tablet    Sig: Take 1 tablet (12.5 mg total) by mouth 2 (two) times daily with a meal.    Dispense:  180 tablet    Refill:  1    Medications Discontinued During This Encounter  Medication Reason   dronedarone (MULTAQ) 400 MG tablet Ineffective   carvedilol (COREG) 6.25 MG tablet Reorder     Recommendations:   Tanice Petre is a 72 y.o. African-American female patient with regard cardiomyopathy from EF 20% sometime in the 2022, normalized on medical therapy by September 2 EF 55 to 60%, persistent atrial fibrillation/atypical atrial flutter, history of embolic stroke SP tPA and thrombectomy 05/12/2020, Patient was sent sinus rhythm last in December 2022, developed recurrence of atrial fibrillation in January 2023.  Past medical history also significant for primary hypertension, hypercholesterolemia, diabetes mellitus presents here for 6-week office visit.   1. Persistent atrial fibrillation (HCC) Patient last seen in the emergency room with A-fib with RVR at 05/27/2022 underwent direct-current cardioversion x 1 with 120 J to sinus rhythm.  She is now back in atypical atrial flutter.  I discussed with the patient regarding rate control versus rhythm control.  Patient is presently doing well and remains asymptomatic.   She is also lost about 20 pounds in weight since starting Ozempic.  However she has known history of cardiomyopathy that occurred with A-fib with RVR and when she presented with stroke, hence we need to closely monitor her LVEF. She was on Multaq before, obviously not effective, has been discontinued.  I will increase carvedilol from 6.25 mg to 12.5 mg twice daily.  I will set up for Lexiscan nuclear stress test and if nonischemic, could consider flecainide as an alternative for maintaining sinus rhythm.  This is in view of prior cardiomyopathy.  She will continue with Xarelto which she is tolerating without side effects. - EKG 12-Lead  2. Essential hypertension Blood pressure is now well-controlled on amlodipine and carvedilol however she is a diabetic and she needs to be on an ARB which will also reduce risk of recurrence of atrial fibrillation.  I will address this on her next office visit.  3. Long term (current) use of anticoagulants She could not tolerate Eliquis however she is able to tolerate rivaroxaban without any side effects.  Continue the same.  Office visit in 4 to 6 weeks for follow-up.  This is a 40-minute office visit encounter, I reviewed her chart from recent hospitalization, I reviewed her extensive charts from prior hospitalizations and updated them.    Yates Decamp, MD, [facility] 06/22/2022, 7:01 PM Office: [phone]

## 2022-06-23 ENCOUNTER — Other Ambulatory Visit: Payer: Self-pay

## 2022-06-23 MED ORDER — ACCU-CHEK SOFTCLIX LANCETS MISC: 1 refills | Status: DC

## 2022-06-23 MED ORDER — ACCU-CHEK GUIDE VI STRP: 1.0000 | ORAL_STRIP | Freq: Two times a day (BID) | 1 refills | Status: AC

## 2022-06-23 NOTE — Telephone Encounter (Signed)
Amira called back stating that they haven't received the transportation form still. She stated that its better if we email back to her [email or web address]  Because the fax its not coming through.

## 2022-06-24 ENCOUNTER — Telehealth: Payer: Self-pay

## 2022-06-24 NOTE — Telephone Encounter (Signed)
Pt called stating she needs a prior authorization for her Freestyle Libre 3 reader.

## 2022-06-24 NOTE — Telephone Encounter (Signed)
Left voicemail to notify Amira that I have tried to refax from & I also emailed it to her as requested.

## 2022-06-25 ENCOUNTER — Other Ambulatory Visit (HOSPITAL_COMMUNITY): Payer: Self-pay

## 2022-06-25 NOTE — Telephone Encounter (Signed)
Patients Medicaid is showing an alternative primary pharmacy benefits insurance. Patient may have to contact Medicaid if this is not correct.

## 2022-06-28 NOTE — Telephone Encounter (Signed)
Made pt aware

## 2022-07-05 ENCOUNTER — Other Ambulatory Visit: Payer: Medicare Other

## 2022-07-12 ENCOUNTER — Ambulatory Visit: Payer: Medicare Other

## 2022-07-12 DIAGNOSIS — I4819 Other persistent atrial fibrillation: Secondary | ICD-10-CM

## 2022-07-13 ENCOUNTER — Telehealth: Payer: Self-pay | Admitting: Family Medicine

## 2022-07-13 ENCOUNTER — Other Ambulatory Visit: Payer: Self-pay | Admitting: Family Medicine

## 2022-07-13 DIAGNOSIS — E1169 Type 2 diabetes mellitus with other specified complication: Secondary | ICD-10-CM

## 2022-07-13 MED ORDER — ATORVASTATIN CALCIUM 80 MG PO TABS: ORAL_TABLET | ORAL | 3 refills | Status: DC

## 2022-07-13 NOTE — Telephone Encounter (Signed)
Prescription Request  07/13/2022  LOV: 06/10/2022  What is the name of the medication or equipment? atorvastatin   Have you contacted your pharmacy to request a refill? Yes   Which pharmacy would you like this sent to?  [id number] - [place], [place] - [address] [place] [address] Phone: [phone] Fax: [phone]    Patient notified that their request is being sent to the clinical staff for review and that they should receive a response within 2 business days.   Please advise at Mobile [phone] (mobile)   Pt need a libre reader sent to [facility] in [place]. Pt stated medicare will pay for one

## 2022-07-18 NOTE — Progress Notes (Signed)
Lexiscan Tetrofosmin stress test 07/12/2022: Lexiscan stress test performed using 1 day protocol.  Mild decreased in tracer uptake at both rest and stress, likely due to breast attenuation artifact, with imaging performed in sitting position. Stress EF 33%. High risk stress test due to low stress LVEF. No definite evidence of myocardial perfusion deficit. Recommend clinical correlation.

## 2022-07-19 ENCOUNTER — Encounter: Payer: Self-pay | Admitting: Podiatry

## 2022-07-19 ENCOUNTER — Ambulatory Visit (INDEPENDENT_AMBULATORY_CARE_PROVIDER_SITE_OTHER): Payer: Medicare Other | Admitting: Podiatry

## 2022-07-19 ENCOUNTER — Telehealth: Payer: Self-pay | Admitting: Nurse Practitioner

## 2022-07-19 ENCOUNTER — Telehealth: Payer: Self-pay | Admitting: Family Medicine

## 2022-07-19 DIAGNOSIS — M79675 Pain in left toe(s): Secondary | ICD-10-CM

## 2022-07-19 DIAGNOSIS — M79674 Pain in right toe(s): Secondary | ICD-10-CM

## 2022-07-19 DIAGNOSIS — B351 Tinea unguium: Secondary | ICD-10-CM

## 2022-07-19 DIAGNOSIS — E1165 Type 2 diabetes mellitus with hyperglycemia: Secondary | ICD-10-CM

## 2022-07-19 DIAGNOSIS — Z7901 Long term (current) use of anticoagulants: Secondary | ICD-10-CM

## 2022-07-19 MED ORDER — FREESTYLE LIBRE 3 SENSOR MISC: 3 refills | Status: DC

## 2022-07-19 NOTE — Telephone Encounter (Signed)
done

## 2022-07-19 NOTE — Telephone Encounter (Signed)
Pt is needed new prescription for [facility] 3 sensors sent into [facility] on [address] [place], [place] [address]

## 2022-07-19 NOTE — Progress Notes (Signed)
This patient presents to the office with chief complaint of long thick nails.   This patient  says there  is  no pain and discomfort in her feet.  This patient says there are long thick painful big toenails   These nails are painful walking and wearing shoes.  Patient has no history of infection or drainage from both feet.  Patient is unable to  self treat his own nails . This patient presents  to the office today for treatment of the  long nails.  Patient is diabetic.  General Appearance  Alert, conversant and in no acute stress.  Vascular  Dorsalis pedis and posterior tibial  pulses are palpable  bilaterally.  Capillary return is within normal limits  bilaterally. Temperature is within normal limits  bilaterally.  Neurologic  Senn-Weinstein monofilament wire test within normal limits  bilaterally. Muscle power within normal limits bilaterally.  Nails Thick disfigured discolored nails with subungual debris  hallux nails bilaterally. No evidence of bacterial infection or drainage bilaterally.  Orthopedic  No limitations of motion of motion feet .  No crepitus or effusions noted.  Mild  HAV  B/L.  Skin  normotropic skin with no porokeratosis noted bilaterally.  No signs of infections or ulcers noted.     Onychomycosis  Diabetes with no foot complications  Debride nails x 10.  A diabetic foot exam was performed and there is no evidence of any vascular or neurologic pathology.   RTC 12 weeks    Khalil Belote DPM   

## 2022-07-22 ENCOUNTER — Ambulatory Visit: Payer: Medicare Other | Admitting: Cardiology

## 2022-07-27 ENCOUNTER — Other Ambulatory Visit: Payer: Self-pay | Admitting: *Deleted

## 2022-07-27 DIAGNOSIS — E1165 Type 2 diabetes mellitus with hyperglycemia: Secondary | ICD-10-CM

## 2022-07-27 DIAGNOSIS — Z794 Long term (current) use of insulin: Secondary | ICD-10-CM

## 2022-07-27 MED ORDER — FREESTYLE LIBRE 3 READER DEVI
0 refills | Status: DC
Start: 2022-07-27 — End: 2022-08-09

## 2022-07-27 MED ORDER — FREESTYLE LIBRE 3 SENSOR MISC
3 refills | Status: DC
Start: 2022-07-27 — End: 2022-08-09

## 2022-07-27 MED ORDER — ACCU-CHEK SOFTCLIX LANCETS MISC
1 refills | Status: DC
Start: 2022-07-27 — End: 2022-10-26

## 2022-07-29 ENCOUNTER — Other Ambulatory Visit (HOSPITAL_COMMUNITY): Payer: Self-pay

## 2022-07-29 ENCOUNTER — Telehealth: Payer: Self-pay | Admitting: *Deleted

## 2022-07-29 NOTE — Telephone Encounter (Signed)
Patient left a message that she was having trouble getting her [name] [name] 3 reader and the sensors. [facility] cannot get past the insurance company. Patient was using the [place] 2 reader and the sensors for this one. She is in need of the upgrade.  We can see that it is in Cover My Meds. We will follow up with the RX PA team , to see if they can help get this done. Patient will be called and updated.

## 2022-07-29 NOTE — Telephone Encounter (Signed)
Patients Medicaid is showing an alternative primary pharmacy benefits insurance. Patient may have to contact Medicaid if this is not correct.   It looks like this message was sent 06/24/22 by another member of the prior auth team. Per test claim, the same issue is occurring. Please have pt check with her insurance

## 2022-07-29 NOTE — Telephone Encounter (Signed)
Patient was called and I attempted to leave the message on the patient's voicemail.

## 2022-08-09 ENCOUNTER — Other Ambulatory Visit: Payer: Self-pay | Admitting: *Deleted

## 2022-08-09 DIAGNOSIS — Z794 Long term (current) use of insulin: Secondary | ICD-10-CM

## 2022-08-09 DIAGNOSIS — E1165 Type 2 diabetes mellitus with hyperglycemia: Secondary | ICD-10-CM

## 2022-08-09 MED ORDER — FREESTYLE LIBRE 2 SENSOR MISC
3 refills | Status: DC
Start: 2022-08-09 — End: 2022-09-15

## 2022-08-09 MED ORDER — FREESTYLE LIBRE 2 READER DEVI
0 refills | Status: DC
Start: 2022-08-09 — End: 2022-11-15

## 2022-08-10 ENCOUNTER — Encounter: Payer: Self-pay | Admitting: Cardiology

## 2022-08-10 ENCOUNTER — Ambulatory Visit: Payer: Medicare Other | Admitting: Cardiology

## 2022-08-10 VITALS — BP 128/83 | HR 111 | Resp 16 | Ht 63.0 in | Wt 193.8 lb

## 2022-08-10 DIAGNOSIS — I484 Atypical atrial flutter: Secondary | ICD-10-CM

## 2022-08-10 DIAGNOSIS — I428 Other cardiomyopathies: Secondary | ICD-10-CM

## 2022-08-10 DIAGNOSIS — E1165 Type 2 diabetes mellitus with hyperglycemia: Secondary | ICD-10-CM

## 2022-08-10 DIAGNOSIS — I10 Essential (primary) hypertension: Secondary | ICD-10-CM

## 2022-08-10 MED ORDER — SEMAGLUTIDE (1 MG/DOSE) 4 MG/3ML ~~LOC~~ SOPN
1.0000 mg | PEN_INJECTOR | SUBCUTANEOUS | 3 refills | Status: DC
Start: 2022-08-10 — End: 2022-09-15

## 2022-08-10 MED ORDER — CARVEDILOL 25 MG PO TABS
25.0000 mg | ORAL_TABLET | Freq: Two times a day (BID) | ORAL | 3 refills | Status: DC
Start: 2022-08-10 — End: 2023-02-01

## 2022-08-10 MED ORDER — LOSARTAN POTASSIUM 25 MG PO TABS: 25.0000 mg | ORAL_TABLET | Freq: Every evening | ORAL | 3 refills | Status: DC

## 2022-08-10 NOTE — Progress Notes (Signed)
Primary Physician/Referring:  Dana Allan, MD  Patient ID: [name] [name], female    DOB: [birth year], 72 y.o.   MRN: [id number]  Chief Complaint  Patient presents with   Hypertension   Persistent atrial fibrillation   Follow-up   HPI:    Arybella Lemmo  is a 72 y.o. African-American female patient with non ischemic-cardiomyopathy from EF 20% sometime in the 2022, normalized on medical therapy by September 2 EF 55 to 60%, paroxysmal atrial fibrillation/atypical atrial flutter, history of embolic stroke SP tPA and thrombectomy 05/12/2020, developed recurrence of atrial fibrillation in January 2023.  Past medical history also significant for primary hypertension, hypercholesterolemia, diabetes mellitus presents here for 6-week office visit.  Patient states that since starting Ozempic she is feeling the best she has in quite a while.  She has lost about 20 pounds in weight over the past 4 to 6 weeks.  She denies palpitations, dizziness or syncope, chest pain or or dyspnea.  She is planning on a cruise next week.  Past Medical History:  Diagnosis Date   Abdominal pain    Abnormal chest x-ray 02/08/2021   Acute respiratory failure with hypoxia (HCC)    Arthritis    Atrial flutter (HCC)    Atrial tachycardia 07/29/2019   Back pain    Back pain 11/15/2014   Bilateral leg edema 03/18/2020   Blood transfusion without reported diagnosis    Chronic anticoagulation - on Eliquis for chronic afib 02/04/2020   Chronic combined systolic and diastolic CHF (congestive heart failure) (HCC) 02/04/2020   Chronic heart failure with preserved ejection fraction (HFpEF) (HCC) 03/18/2020   LVEF less than 20% (May 2022), LVEF 55 to 60% (September 2022)   Constipation    Contact dermatitis 08/12/2020   COVID-19 02/04/2020   Diabetes mellitus without complication (HCC)    Diabetes mellitus without complication (HCC)    Dysphagia 02/03/2020   Epigastric pain 02/03/2020   Fatigue     Fatigue    Full dentures    Full dentures    GERD (gastroesophageal reflux disease)    Hemorrhoids    History of esophageal stricture 07/29/2019   History of noncompliance with medical treatment, presenting hazards to health    Hospital discharge follow-up 07/27/2020   Hyperlipidemia    Hypertension    Intertrigo 09/04/2020   Lack of adequate sleep    Lack of adequate sleep    Liver mass    Liver mass    Morbid obesity (HCC) 02/03/2020   Nonrheumatic tricuspid valve regurgitation 03/18/2020   Oral thrush 02/04/2020   Osteopenia after menopause 06/09/2017   PAF (paroxysmal atrial fibrillation) (HCC)    Pericardial effusion 05/12/2010   Pleural effusion 06/07/2020   Pleural effusion on left    QT prolongation    Rash    on right breast   Right upper quadrant abdominal pain    Stroke (cerebrum) (HCC) 05/12/2020   Uncontrolled type 2 diabetes mellitus with hyperglycemia (HCC) 06/08/2017   Wears glasses    Wears glasses    Past Surgical History:  Procedure Laterality Date   BREAST DUCTAL SYSTEM EXCISION     BREAST EXCISIONAL BIOPSY Left    CARDIAC SURGERY     CARDIOVERSION N/A 07/31/2019   Procedure: CARDIOVERSION;  Surgeon: Chrystie Nose, MD;  Location: [facility] ENDOSCOPY;  Service: Cardiovascular;  Laterality: N/A;   CARDIOVERSION N/A 05/19/2020   Procedure: CARDIOVERSION;  Surgeon: Dolores Patty, MD;  Location: [facility] ENDOSCOPY;  Service: Cardiovascular;  Laterality: N/A;  CATARACT EXTRACTION Bilateral    CESAREAN SECTION     CHOLECYSTECTOMY     CHOLECYSTECTOMY, LAPAROSCOPIC  07/24/2010   ESOPHAGEAL DILATION     IR CT HEAD LTD  05/12/2020   IR PERCUTANEOUS ART THROMBECTOMY/INFUSION INTRACRANIAL INC DIAG ANGIO  05/12/2020       IR PERCUTANEOUS ART THROMBECTOMY/INFUSION INTRACRANIAL INC DIAG ANGIO  05/12/2020   IR US GUIDE VASC ACCESS RIGHT  05/12/2020   RADIOLOGY WITH ANESTHESIA N/A 05/12/2020   Procedure: IR WITH ANESTHESIA;  Surgeon: Radiologist, Medication, MD;   Location: [facility] OR;  Service: Radiology;  Laterality: N/A;   TEE WITHOUT CARDIOVERSION N/A 07/31/2019   Procedure: TRANSESOPHAGEAL ECHOCARDIOGRAM (TEE);  Surgeon: Chrystie Nose, MD;  Location: [facility] ENDOSCOPY;  Service: Cardiovascular;  Laterality: N/A;   TEE WITHOUT CARDIOVERSION N/A 05/19/2020   Procedure: TRANSESOPHAGEAL ECHOCARDIOGRAM (TEE);  Surgeon: Dolores Patty, MD;  Location: [facility] ENDOSCOPY;  Service: Cardiovascular;  Laterality: N/A;   Family History  Problem Relation Age of Onset   Stroke Mother    Alcohol abuse Father    Other Father        broken heart after spouse spouse   Diabetes Sister    Hypertension Sister    Diabetes Sister    Multiple sclerosis Sister    Miscarriages / [place] Daughter    Drug abuse Daughter    Alcohol abuse Other    Diabetes Other    Breast cancer Neg Hx     Social History   Tobacco Use   Smoking status: Never    Passive exposure: Never   Smokeless tobacco: Never  Substance Use Topics   Alcohol use: No    Alcohol/week: 0.0 standard drinks of alcohol   Marital Status: Single  ROS  Review of Systems  Cardiovascular:  Negative for chest pain, dyspnea on exertion and leg swelling.   Objective      08/10/2022    9:58 AM 06/22/2022    3:09 PM 06/21/2022    9:22 AM  Vitals with BMI  Height 5\' 3"  5\' 3"  5\' 3"   Weight 193 lbs 13 oz 200 lbs 199 lbs 10 oz  BMI 34.34 35.44 35.37  Systolic 128 132 841  Diastolic 83 76 82  Pulse 111 105 116   Blood pressure 128/83, pulse (!) 111, resp. rate 16, height 5\' 3"  (1.6 m), weight 193 lb 12.8 oz (87.9 kg), SpO2 94%.   Physical Exam Neck:     Vascular: No carotid bruit or JVD.  Cardiovascular:     Rate and Rhythm: Normal rate. Rhythm irregular.     Pulses: Normal pulses and intact distal pulses.     Heart sounds: No murmur heard. Pulmonary:     Effort: Pulmonary effort is normal.     Breath sounds: Normal breath sounds.  Abdominal:     General: Bowel sounds are normal.     Palpations:  Abdomen is soft.  Musculoskeletal:     Right lower leg: No edema.     Left lower leg: No edema.  Skin:    Capillary Refill: Capillary refill takes less than 2 seconds.     Laboratory examination:   Recent Labs    02/05/22 2255 04/13/22 1449 04/22/22 1648 05/13/22 1924 05/27/22 0640  NA 135   < > 140 137 133*  K 3.7   < > 4.2 3.6 4.5  CL 104   < > 100 103 103  CO2 22   < > 27 25 19*  GLUCOSE 254*   < >  171* 105* 179*  BUN 23   < > 26* 26* 19  CREATININE 1.04*   < > 1.22* 1.19* 1.03*  CALCIUM 8.9   < > 9.3 8.9 8.8*  GFRNONAA 58*  --   --  49* 58*   < > = values in this interval not displayed.    Lab Results  Component Value Date   GLUCOSE 179 (H) 05/27/2022   NA 133 (L) 05/27/2022   K 4.5 05/27/2022   CL 103 05/27/2022   CO2 19 (L) 05/27/2022   BUN 19 05/27/2022   CREATININE 1.03 (H) 05/27/2022   GFRNONAA 58 (L) 05/27/2022   CALCIUM 8.8 (L) 05/27/2022   PHOS 3.4 06/19/2020   PROT 7.9 05/27/2022   ALBUMIN 3.3 (L) 05/27/2022   LABGLOB 3.0 12/10/2020   AGRATIO 1.4 12/10/2020   BILITOT 0.5 05/27/2022   ALKPHOS 104 05/27/2022   AST 27 05/27/2022   ALT 49 (H) 05/27/2022   ANIONGAP 11 05/27/2022      Lab Results  Component Value Date   ALT 49 (H) 05/27/2022   AST 27 05/27/2022   ALKPHOS 104 05/27/2022   BILITOT 0.5 05/27/2022       Latest Ref Rng & Units 05/27/2022    6:40 AM 05/13/2022    7:24 PM 04/13/2022    2:49 PM  Hepatic Function  Total Protein 6.5 - 8.1 g/dL 7.9  7.1  7.6   Albumin 3.5 - 5.0 g/dL 3.3  2.9  4.1   AST 15 - 41 U/L 27  16  13    ALT 0 - 44 U/L 49  19  15   Alk Phosphatase 38 - 126 U/L 104  96  108   Total Bilirubin 0.3 - 1.2 mg/dL 0.5  0.4  0.3    Lab Results  Component Value Date   CHOL 147 05/11/2021   HDL 41 05/11/2021   LDLCALC 84 05/11/2021   TRIG 125 05/11/2021   CHOLHDL 3.6 05/11/2021     HEMOGLOBIN A1C Lab Results  Component Value Date   HGBA1C 10.9 (H) 04/13/2022   MPG 246 12/19/2021   TSH Recent Labs     02/05/22 2255  TSH 2.727   Radiology:   Chest x-ray 05/27/2022: Portable AP semi upright view at 0643 hours. Stable cardiomegaly  mediastinal contours. Mildly improved lung volumes. Allowing for portable technique the lungs are clear. No pneumothorax or pleural effusion.   Cardiac Studies:    ECHOCARDIOGRAM COMPLETE 12/29/2021:  1. Left ventricular ejection fraction, by estimation, is 55 to 60%. The left ventricle has normal function. The left ventricle has no regional wall motion abnormalities. There is moderate concentric left ventricular hypertrophy. Left ventricular  diastolic parameters are consistent with Grade III diastolic dysfunction (restrictive).  2. Right ventricular systolic function is normal. The right ventricular size is normal. Tricuspid regurgitation signal is inadequate for assessing PA pressure.  3. A small pericardial effusion is present. There is no evidence of cardiac tamponade.  4. The mitral valve is grossly normal. Trivial mitral valve regurgitation. No evidence of mitral stenosis.  5. The aortic valve is tricuspid. Aortic valve regurgitation is not visualized. No aortic stenosis is present.  6. There is mild dilatation of the aortic root, measuring 41 mm.  7. The inferior vena cava is normal in size with greater than 50% respiratory variability, suggesting right atrial pressure of 3 mmHg.  Lexiscan Tetrofosmin stress test 07/12/2022: Lexiscan stress test performed using 1 day protocol. Mild decreased in tracer uptake at  both rest and stress, likely due to breast attenuation artifact, with imaging performed in sitting position. Stress EF 33%. High risk stress test due to low stress LVEF. No definite evidence of myocardial perfusion deficit. Recommend clinical correlation.    EKG:   EKG 06/22/2022: Atrial tachycardia/atypical atrial flutter with 2: 1 conduction, ventricular rate 115 bpm, normal axis, nonspecific T abnormality.  Compared to 06/03/2022, no significant  change.    EKG 08/28/2020, normal sinus rhythm at rate of 74 bpm, diffuse inferolateral sagging ST segment depressions.    Medications and allergies   Allergies  Allergen Reactions   Food Shortness Of Breath, Swelling and Other (See Comments)    Fruit - tongue swelling/numbness (ok with organic fruit) Tree nuts - tongue swelling/numbness (ok with organic nuts)   Pacerone [Amiodarone] Shortness Of Breath and Other (See Comments)    Myalgias  Wheezing  Asthenia    Peanut (Diagnostic) Shortness Of Breath and Swelling    Tongue swelling/numbness   Toprol Xl [Metoprolol] Shortness Of Breath, Other (See Comments) and Cough    Myalgias Wheezing Asthenia    Asa [Aspirin] Nausea And Vomiting and Other (See Comments)    Told to avoid due to liver issues   Lactose Intolerance (Gi) Diarrhea and Nausea And Vomiting     Medication list   Current Outpatient Medications:    Accu-Chek Softclix Lancets lancets, Use as instructed bid. E11.65, Disp: 200 each, Rfl: 1   acetaminophen (TYLENOL) 650 MG CR tablet, Take 1 tablet (650 mg total) by mouth every 8 (eight) hours as needed for pain., Disp: , Rfl:    atorvastatin (LIPITOR) 80 MG tablet, TAKE 1 TABLET(80 MG) BY MOUTH DAILY, Disp: 90 tablet, Rfl: 3   blood glucose meter kit and supplies, Dispense based on patient and insurance preference. Use up to four times daily as directed. (FOR ICD-10 E10.9, E11.9)., Disp: 1 each, Rfl: 0   Blood Glucose Monitoring Suppl (ACCU-CHEK GUIDE ME) w/Device KIT, Use to check glucose twice daily, Disp: 1 kit, Rfl: 0   Continuous Glucose Receiver (FREESTYLE LIBRE 2 READER) DEVI, Patient is to monitor blood glucose readings as instructed by the physician, Disp: 1 each, Rfl: 0   Continuous Glucose Sensor (FREESTYLE LIBRE 2 SENSOR) MISC, Patient is to change the sensor every 14 days as been directed, Disp: 2 each, Rfl: 3   furosemide (LASIX) 40 MG tablet, Take 40 mg by mouth daily as needed for fluid., Disp: , Rfl:     glucose blood (ACCU-CHEK GUIDE) test strip, 1 each by Other route 2 (two) times daily. Use as instructed bid E11.65, Disp: 200 each, Rfl: 1   Insulin Pen Needle (BD PEN NEEDLE MICRO U/F) 32G X 6 MM MISC, Use to inject insulin daily, Disp: 100 each, Rfl: 3   losartan (COZAAR) 25 MG tablet, Take 1 tablet (25 mg total) by mouth every evening., Disp: 90 tablet, Rfl: 3   magnesium oxide (MAG-OX) 400 MG tablet, Take by mouth., Disp: , Rfl:    pantoprazole (PROTONIX) 40 MG tablet, TAKE 1 TABLET(40 MG) BY MOUTH DAILY, Disp: 90 tablet, Rfl: 2   potassium chloride (MICRO-K) 10 MEQ CR capsule, Take 10 mEq by mouth daily as needed (With Furosemide)., Disp: , Rfl:    rivaroxaban (XARELTO) 20 MG TABS tablet, Take 1 tablet (20 mg total) by mouth daily with supper., Disp: 30 tablet, Rfl: 10   Semaglutide, 1 MG/DOSE, 4 MG/3ML SOPN, Inject 1 mg into the skin once a week., Disp: 9 mL, Rfl: 3  carvedilol (COREG) 25 MG tablet, Take 1 tablet (25 mg total) by mouth 2 (two) times daily with a meal., Disp: 180 tablet, Rfl: 3   insulin glargine (LANTUS SOLOSTAR) 100 UNIT/ML Solostar Pen, Inject 20 Units into the skin at bedtime. ADMINISTER 35 UNITS UNDER THE SKIN in am (Patient not taking: Reported on 08/10/2022), Disp: 18 mL, Rfl: 3  Assessment     ICD-10-CM   1. Atypical atrial flutter (HCC)  I48.4 PCV ECHOCARDIOGRAM COMPLETE    carvedilol (COREG) 25 MG tablet    CBC    Basic metabolic panel    2. Essential hypertension  I10     3. Type 2 diabetes mellitus with hyperglycemia, with long-term current use of insulin (HCC)  E11.65 Semaglutide, 1 MG/DOSE, 4 MG/3ML SOPN   Z79.4 losartan (COZAAR) 25 MG tablet    Hgb A1c w/o eAG    4. Non-ischemic cardiomyopathy (HCC)  I42.8 PCV ECHOCARDIOGRAM COMPLETE    carvedilol (COREG) 25 MG tablet        Orders Placed This Encounter  Procedures   CBC   Basic metabolic panel   Hgb A1c w/o eAG   PCV ECHOCARDIOGRAM COMPLETE    Standing Status:   Future    Standing Expiration  Date:   08/10/2023    Meds ordered this encounter  Medications   Semaglutide, 1 MG/DOSE, 4 MG/3ML SOPN    Sig: Inject 1 mg into the skin once a week.    Dispense:  9 mL    Refill:  3   carvedilol (COREG) 25 MG tablet    Sig: Take 1 tablet (25 mg total) by mouth 2 (two) times daily with a meal.    Dispense:  180 tablet    Refill:  3   losartan (COZAAR) 25 MG tablet    Sig: Take 1 tablet (25 mg total) by mouth every evening.    Dispense:  90 tablet    Refill:  3    Medications Discontinued During This Encounter  Medication Reason   Semaglutide,0.25 or 0.5MG /DOS, (OZEMPIC, 0.25 OR 0.5 MG/DOSE,) 2 MG/1.5ML SOPN Dose change   amLODipine (NORVASC) 10 MG tablet Change in therapy   carvedilol (COREG) 12.5 MG tablet Reorder      Recommendations:   Nicolly Hamid is a 72 y.o. African-American female patient with non ischemic-cardiomyopathy from EF 20% sometime in the 2022, normalized on medical therapy by September 2 EF 55 to 60%, paroxysmal atrial fibrillation/atypical atrial flutter, history of embolic stroke SP tPA and thrombectomy 05/12/2020, developed recurrence of atrial fibrillation in January 2023.    Past medical history also significant for primary hypertension, hypercholesterolemia, diabetes mellitus presents here for 6-week office visit.  1. Atypical atrial flutter (HCC) Patient has been persistent atypical atrial flutter with 2: 1 conduction since January 2023, I still believe that we should proceed with direct-current cardioversion in view of prior cardiomyopathy related to atrial fibrillation.  She is presently on anticoagulation, continue the same.  Previously did not tolerate metoprolol succinate however is tolerating carvedilol, will increase the dose from 12.5 mg to 25 mg twice daily.  Will also add losartan 25 mg in the evening in view of hypertension and also diabetes mellitus.  Schedule for Direct current cardioversion. I have discussed regarding risks benefits  rate control vs rhythm control with the patient. Patient understands cardiac arrest and need for CPR, aspiration pneumonia, but not limited to these. Patient is willing.   - PCV ECHOCARDIOGRAM COMPLETE; Future - carvedilol (COREG) 25 MG tablet;  Take 1 tablet (25 mg total) by mouth 2 (two) times daily with a meal.  Dispense: 180 tablet; Refill: 3  2. Essential hypertension Blood pressure is well-controlled, I will stop the amlodipine as I am starting her on losartan and also increasing the dose of the carvedilol.  3. Type 2 diabetes mellitus with hyperglycemia with long-term current use of insulin (HCC) Patient has discontinued using insulin as her blood sugars have been very low.  She is tolerating semaglutide 0.5 mg dose, will uptitrate this to 1 mg dose for maximal cardiovascular protection and also for diabetes control.  She will need A1c. Since initiation of Ozempic, in June 2024, she has lost approximately 78 pounds in weight.  Her goal is to lose at least additional 20 pounds.  She is feeling the best she has in quite a while.  - Semaglutide, 1 MG/DOSE, 4 MG/3ML SOPN; Inject 1 mg into the skin once a week.  Dispense: 9 mL; Refill: 3 - losartan (COZAAR) 25 MG tablet; Take 1 tablet (25 mg total) by mouth every evening.  Dispense: 90 tablet; Refill: 3 - Hgb A1c w/o eAG  4.  Non-ischemic cardiomyopathy (HCC) Patient previously has had nonischemic cardiomyopathy, recovered LVEF.  I am concerned about her presence of atrial fibrillation and the nuclear stress test reveals markedly decreased LVEF.  I will schedule her for an echocardiogram.  In the interim I will go ahead and schedule her for direct-current cardioversion.  I would like to see her back in 6 weeks for follow-up.  - PCV ECHOCARDIOGRAM COMPLETE; Future - carvedilol (COREG) 25 MG tablet; Take 1 tablet (25 mg total) by mouth 2 (two) times daily with a meal.  Dispense: 180 tablet; Refill: 3     Yates Decamp, MD, [facility] 08/10/2022, 10:59  AM Office: [phone]

## 2022-08-25 ENCOUNTER — Telehealth: Payer: Self-pay | Admitting: *Deleted

## 2022-08-25 ENCOUNTER — Other Ambulatory Visit (HOSPITAL_COMMUNITY): Payer: Self-pay

## 2022-08-25 ENCOUNTER — Telehealth: Payer: Self-pay

## 2022-08-25 NOTE — Telephone Encounter (Signed)
Pharmacy Patient Advocate Encounter   Received notification from Pt Calls Messages that prior authorization for Freestyle libre 2 sensor is required/requested.   Insurance verification completed.   The patient is insured through [facility] MEDICAID .   Per test claim:BILL TO OTHER PAYOR  Per patient: there is no other insurance.  PA submitted through [facility] [employer]  Confirmation: [id number]  Status is pending

## 2022-08-25 NOTE — Telephone Encounter (Signed)
Patient has called again, sharing that [employer] is blaming [place] because she does not have her Freestyle [employer]. Patient states that she has called medicaid and they have verified with her that she does not have another insurance, she also got a new card last week. She gave me this number foe verification - [id number]. She ask that we make sure that we are sending in her name as [name] [name] or [name] [name] as there is another person with the same name.Our patient's birthday is 10-21-1950. I ask the patient if there was any other insurance at all, she shared that she had [place] [employer].She also shared that she had check with them and they told her the same thing, she does not have any other insurance. Patient was advised that this information would be sent to the PA team and we would keep her informed.

## 2022-08-25 NOTE — Telephone Encounter (Signed)
Patient was called and updated

## 2022-08-26 NOTE — H&P (View-Only) (Signed)
 Just forwarding you the labs. A1c still super high

## 2022-08-26 NOTE — Progress Notes (Signed)
Just forwarding you the labs. A1c still super high

## 2022-08-26 NOTE — Progress Notes (Signed)
Spoke to pt and instructed them to come at 0915 and to be NPO after 0000. Confirmed no missed doses of AC and instructed to take in AM with a small sip of water.   Confirmed that pt will have a ride home and someone to stay with them for 24 hours after the procedure.

## 2022-08-27 ENCOUNTER — Encounter (HOSPITAL_COMMUNITY)
Admission: RE | Disposition: A | Discharge status: Home / Self Care | Payer: Self-pay | Source: Home / Self Care | Attending: Cardiology

## 2022-08-27 ENCOUNTER — Other Ambulatory Visit: Payer: Self-pay

## 2022-08-27 ENCOUNTER — Ambulatory Visit (HOSPITAL_COMMUNITY)
Admission: RE | Admit: 2022-08-27 | Discharge: 2022-08-27 | Disposition: A | Discharge status: Home / Self Care | Payer: Medicare Other | Attending: Cardiology | Admitting: Cardiology

## 2022-08-27 ENCOUNTER — Ambulatory Visit (HOSPITAL_BASED_OUTPATIENT_CLINIC_OR_DEPARTMENT_OTHER): Payer: Medicare Other | Admitting: Anesthesiology

## 2022-08-27 ENCOUNTER — Ambulatory Visit (HOSPITAL_COMMUNITY): Payer: Medicare Other | Admitting: Anesthesiology

## 2022-08-27 ENCOUNTER — Encounter (HOSPITAL_COMMUNITY): Payer: Self-pay | Admitting: Cardiology

## 2022-08-27 DIAGNOSIS — N183 Chronic kidney disease, stage 3 unspecified: Secondary | ICD-10-CM | POA: Diagnosis not present

## 2022-08-27 DIAGNOSIS — K219 Gastro-esophageal reflux disease without esophagitis: Secondary | ICD-10-CM | POA: Insufficient documentation

## 2022-08-27 DIAGNOSIS — I5042 Chronic combined systolic (congestive) and diastolic (congestive) heart failure: Secondary | ICD-10-CM

## 2022-08-27 DIAGNOSIS — I4891 Unspecified atrial fibrillation: Secondary | ICD-10-CM | POA: Insufficient documentation

## 2022-08-27 DIAGNOSIS — I110 Hypertensive heart disease with heart failure: Secondary | ICD-10-CM | POA: Insufficient documentation

## 2022-08-27 DIAGNOSIS — I509 Heart failure, unspecified: Secondary | ICD-10-CM | POA: Insufficient documentation

## 2022-08-27 DIAGNOSIS — E119 Type 2 diabetes mellitus without complications: Secondary | ICD-10-CM | POA: Diagnosis not present

## 2022-08-27 DIAGNOSIS — I130 Hypertensive heart and chronic kidney disease with heart failure and stage 1 through stage 4 chronic kidney disease, or unspecified chronic kidney disease: Secondary | ICD-10-CM | POA: Diagnosis not present

## 2022-08-27 HISTORY — PX: CARDIOVERSION: SHX1299

## 2022-08-27 LAB — GLUCOSE, CAPILLARY: Glucose-Capillary: 315 mg/dL — ABNORMAL HIGH (ref 70–99)

## 2022-08-27 SURGERY — CARDIOVERSION
Anesthesia: General

## 2022-08-27 MED ORDER — PROPOFOL 10 MG/ML IV BOLUS
INTRAVENOUS | Status: DC | PRN
  Administered 2022-08-27: 80 mg via INTRAVENOUS

## 2022-08-27 MED ORDER — SODIUM CHLORIDE 0.9 % IV SOLN: INTRAVENOUS | Status: DC

## 2022-08-27 MED ORDER — LIDOCAINE 2% (20 MG/ML) 5 ML SYRINGE
INTRAMUSCULAR | Status: DC | PRN
  Administered 2022-08-27: 80 mg via INTRAVENOUS

## 2022-08-27 SURGICAL SUPPLY — 1 items: ELECT DEFIB PAD ADLT CADENCE (PAD) ×1 IMPLANT

## 2022-08-27 NOTE — Anesthesia Postprocedure Evaluation (Signed)
Anesthesia Post Note  Patient: [name] [name]  Procedure(s) Performed: CARDIOVERSION     Patient location during evaluation: PACU Anesthesia Type: General Level of consciousness: awake and alert Pain management: pain level controlled Vital Signs Assessment: post-procedure vital signs reviewed and stable Respiratory status: spontaneous breathing, nonlabored ventilation and respiratory function stable Cardiovascular status: blood pressure returned to baseline and stable Postop Assessment: no apparent nausea or vomiting Anesthetic complications: no   There were no known notable events for this encounter.  Last Vitals:  Vitals:   08/27/22 1151 08/27/22 1200  BP: 122/81 (!) 136/90  Pulse: 88 85  Resp: (!) 24 (!) 21  Temp: 36.8 C   SpO2: 98% 95%    Last Pain:  Vitals:   08/27/22 1151  TempSrc: Temporal  PainSc: 0-No pain                 Lowella Curb

## 2022-08-27 NOTE — CV Procedure (Signed)
Direct current cardioversion 08/27/2022 11:47 AM  Indication symptomatic A. Fibrillation.  Procedure: Using 80 mg of IV Propofol and 80 IV Lidocaine (for reducing venous pain) for achieving deep sedation, synchronized direct current cardioversion performed. Patient was delivered with 120 Joules of electricity X 1 with success to NSR. Patient tolerated the procedure well. No immediate complication noted.   Allergies as of 08/27/2022       Reactions   Food Shortness Of Breath, Swelling, Other (See Comments)   Fruit - tongue swelling/numbness (ok with organic fruit) Tree nuts - tongue swelling/numbness (ok with organic nuts)   Pacerone [amiodarone] Shortness Of Breath, Other (See Comments)   Myalgias  Wheezing  Asthenia    Peanut (diagnostic) Shortness Of Breath, Swelling   Tongue swelling/numbness   Toprol Xl [metoprolol] Shortness Of Breath, Other (See Comments), Cough   Myalgias Wheezing Asthenia    Asa [aspirin] Nausea And Vomiting, Other (See Comments)   Told to avoid due to liver issues   Lactose Intolerance (gi) Diarrhea, Nausea And Vomiting        Medication List     TAKE these medications    Accu-Chek Guide Me w/Device Kit Use to check glucose twice daily   Accu-Chek Guide test strip Generic drug: glucose blood 1 each by Other route 2 (two) times daily. Use as instructed bid E11.65   Accu-Chek Softclix Lancets lancets Use as instructed bid. E11.65   Allergy Relief 25 mg capsule Generic drug: diphenhydrAMINE Take 25 mg by mouth at bedtime.   amLODipine 10 MG tablet Commonly known as: NORVASC Take 10 mg by mouth at bedtime.   atorvastatin 80 MG tablet Commonly known as: LIPITOR TAKE 1 TABLET(80 MG) BY MOUTH DAILY   BD Pen Needle Micro U/F 32G X 6 MM Misc Generic drug: Insulin Pen Needle Use to inject insulin daily   blood glucose meter kit and supplies Dispense based on patient and insurance preference. Use up to four times daily as directed. (FOR ICD-10  E10.9, E11.9).   carvedilol 25 MG tablet Commonly known as: Coreg Take 1 tablet (25 mg total) by mouth 2 (two) times daily with a meal.   FreeStyle Libre 2 Reader [facility] Patient is to monitor blood glucose readings as instructed by the physician   FreeStyle Libre 2 Sensor Misc Patient is to change the sensor every 14 days as been directed   furosemide 40 MG tablet Commonly known as: LASIX Take 40 mg by mouth daily as needed for fluid.   Lantus SoloStar 100 UNIT/ML Solostar Pen Generic drug: insulin glargine Inject 20 Units into the skin at bedtime. ADMINISTER 35 UNITS UNDER THE SKIN in am What changed: additional instructions   losartan 25 MG tablet Commonly known as: COZAAR Take 1 tablet (25 mg total) by mouth every evening.   magnesium oxide 400 MG tablet Commonly known as: MAG-OX Take 400 mg by mouth in the morning.   pantoprazole 40 MG tablet Commonly known as: PROTONIX TAKE 1 TABLET(40 MG) BY MOUTH DAILY   prednisoLONE acetate 1 % ophthalmic suspension Commonly known as: PRED FORTE Place 1 drop into both eyes in the morning and at bedtime.   rivaroxaban 20 MG Tabs tablet Commonly known as: XARELTO Take 1 tablet (20 mg total) by mouth daily with supper.   OZEMPIC (0.25 OR 0.5 MG/DOSE) [place] Inject 0.5 mg into the skin every Monday.   Semaglutide (1 MG/DOSE) 4 MG/3ML Sopn Inject 1 mg into the skin once a week.   vitamin E 180 MG (  400 UNITS) capsule Take 400 Units by mouth in the morning.          Yates Decamp, MD, [facility] 08/27/2022, 11:47 AM Office: [phone] Fax: [phone] Pager: [phone]

## 2022-08-27 NOTE — Anesthesia Preprocedure Evaluation (Signed)
Anesthesia Evaluation  Patient identified by MRN, date of birth, ID band Patient awake    Reviewed: Allergy & Precautions, NPO status , Patient's Chart, lab work & pertinent test results  Airway Mallampati: II  TM Distance: >3 FB Neck ROM: Full    Dental no notable dental hx. (+) Upper Dentures, Lower Dentures   Pulmonary neg pulmonary ROS   Pulmonary exam normal breath sounds clear to auscultation       Cardiovascular hypertension, Pt. on medications +CHF  Normal cardiovascular exam+ dysrhythmias Atrial Fibrillation  Rhythm:Irregular Rate:Normal     Neuro/Psych CVA  negative psych ROS   GI/Hepatic Neg liver ROS,GERD  ,,  Endo/Other  diabetes, Type 2    Renal/GU Renal InsufficiencyRenal disease  negative genitourinary   Musculoskeletal  (+) Arthritis , Osteoarthritis,    Abdominal  (+) + obese  Peds negative pediatric ROS (+)  Hematology negative hematology ROS (+)   Anesthesia Other Findings   Reproductive/Obstetrics negative OB ROS                             Anesthesia Physical Anesthesia Plan  ASA: III  Anesthesia Plan: General   Post-op Pain Management: Minimal or no pain anticipated   Induction: Intravenous  PONV Risk Score and Plan: 3 and Treatment may vary due to age or medical condition  Airway Management Planned: Mask  Additional Equipment:   Intra-op Plan:   Post-operative Plan:   Informed Consent: I have reviewed the patients History and Physical, chart, labs and discussed the procedure including the risks, benefits and alternatives for the proposed anesthesia with the patient or authorized representative who has indicated his/her understanding and acceptance.     Dental advisory given  Plan Discussed with: CRNA and Surgeon  Anesthesia Plan Comments:         Anesthesia Quick Evaluation

## 2022-08-27 NOTE — Transfer of Care (Signed)
Immediate Anesthesia Transfer of Care Note  Patient: Carmin Orta  Procedure(s) Performed: CARDIOVERSION  Patient Location: PACU  Anesthesia Type:MAC  Level of Consciousness: drowsy  Airway & Oxygen Therapy: Patient connected to nasal cannula oxygen  Post-op Assessment: Report given to RN and Post -op Vital signs reviewed and stable  Post vital signs: Reviewed and stable  Last Vitals:  Vitals Value Taken Time  BP 158/106 08/27/22 1136  Temp    Pulse 84   Resp 24 08/27/22 1136  SpO2 99   Vitals shown include unfiled device data.  Last Pain:  Vitals:   08/27/22 0857  TempSrc: Temporal         Complications: There were no known notable events for this encounter.

## 2022-08-27 NOTE — Discharge Instructions (Signed)

## 2022-08-27 NOTE — Interval H&P Note (Signed)
History and Physical Interval Note:  08/27/2022 10:24 AM  [name]  has presented today for surgery, with the diagnosis of AFIB.  The various methods of treatment have been discussed with the patient and family. After consideration of risks, benefits and other options for treatment, the patient has consented to  Procedure(s): CARDIOVERSION (N/A) as a surgical intervention.  The patient's history has been reviewed, patient examined, no change in status, stable for surgery.  I have reviewed the patient's chart and labs.  Questions were answered to the patient's satisfaction.     Yates Decamp

## 2022-08-30 ENCOUNTER — Encounter (HOSPITAL_COMMUNITY): Payer: Self-pay | Admitting: Cardiology

## 2022-09-01 ENCOUNTER — Telehealth: Payer: Self-pay | Admitting: *Deleted

## 2022-09-01 NOTE — Progress Notes (Signed)
  Care Coordination  Health Equity Plan   Note   09/01/2022 Name: [name] [name] MRN: [id number] DOB: January 14, 1950  [name] [name] is a 71 y.o. year old female who sees Dana Allan, MD for primary care. I reached out to [name] by phone today to offer care coordination services.  [name] was given information about Care Coordination services today including:   The Care Coordination services include support from the care team which includes your Nurse Coordinator, Clinical Social Worker, or Pharmacist.  The Care Coordination team is here to help remove barriers to the health concerns and goals most important to you. Care Coordination services are voluntary, and the patient may decline or stop services at any time by request to their care team member.   Care Coordination Consent Status: Patient agreed to services and verbal consent obtained.   Follow up plan:  Telephone appointment with care coordination team member scheduled for:  09/06/22  Encounter Outcome:  Pt. Scheduled  [facility] Coordination Care Guide  Direct Dial: [phone]

## 2022-09-06 ENCOUNTER — Ambulatory Visit: Payer: Self-pay | Admitting: *Deleted

## 2022-09-06 NOTE — Patient Outreach (Signed)
  Care Coordination   Initial Visit Note   09/06/2022 Name: Soriah Denholm MRN: [id number] DOB: Oct 08, 1950  Kristye Bruhl Brueck is a 72 y.o. year old female who sees Dana Allan, MD for primary care. I spoke with  [name] by phone today.  What matters to the patients health and wellness today?  Program explained, but patient declines to participate, stating she will only be involved with anything that is with [facility]. Explained that [facility] (which is the hospital) as well as her PCP office are under the [facility] system, she does not agree and declines to participate.   Declines offer to have education mailed as well.     SDOH assessments and interventions completed:  No     Care Coordination Interventions:  No, not indicated   Follow up plan:  Will have PCP office reach out to patient regarding ACO Reach HEP program.     Encounter Outcome:  Pt. Visit Completed   Kemper Durie, RN, MSN, [facility] Care Management Care Management Coordinator [phone]

## 2022-09-15 ENCOUNTER — Encounter: Payer: Self-pay | Admitting: Family Medicine

## 2022-09-15 ENCOUNTER — Ambulatory Visit (INDEPENDENT_AMBULATORY_CARE_PROVIDER_SITE_OTHER): Payer: Medicare Other | Admitting: Family Medicine

## 2022-09-15 VITALS — BP 134/68 | HR 81 | Temp 98.2°F | Resp 16 | Ht 63.0 in | Wt 191.2 lb

## 2022-09-15 DIAGNOSIS — E1159 Type 2 diabetes mellitus with other circulatory complications: Secondary | ICD-10-CM

## 2022-09-15 DIAGNOSIS — Z794 Long term (current) use of insulin: Secondary | ICD-10-CM

## 2022-09-15 DIAGNOSIS — Z23 Encounter for immunization: Secondary | ICD-10-CM

## 2022-09-15 DIAGNOSIS — I4891 Unspecified atrial fibrillation: Secondary | ICD-10-CM | POA: Diagnosis not present

## 2022-09-15 DIAGNOSIS — E1165 Type 2 diabetes mellitus with hyperglycemia: Secondary | ICD-10-CM

## 2022-09-15 DIAGNOSIS — I152 Hypertension secondary to endocrine disorders: Secondary | ICD-10-CM

## 2022-09-15 MED ORDER — FREESTYLE LIBRE 2 SENSOR MISC
3 refills | Status: DC
Start: 2022-09-15 — End: 2022-10-26

## 2022-09-15 NOTE — Progress Notes (Signed)
SUBJECTIVE:   Chief Complaint  Patient presents with   Diabetes   HPI Presents to clinic for follow up chronic disease management  DM Type 2 Now follows with Endocrinology. Requesting refill for [place] 2 sensor.  Asymptomatic.  CBGs 150's.  Takes Lantus 20 units in am only.  Remains on Ozempic 0.5 mg weekly and tolerating well.  Denies any polyuria, polyphagia or polydipsia. No hypoglycemic episodes.  Afib Tolerating Xarelto and Carvedilol increased to 25 mg BID.  Has not started Multaq.  Denies any chest pain, heart flutter or increased heart rate.  No signs of bleeding.  Tolerating new medications well.    HTN/HFrEF Asymptomatic. Amlodipine was recently discontinued by cardiology and patient was started on Losartan.  Reports has not started Losartan as feels that this will worsen symptoms.  Carvedilol was increased to 25 mg BID and she is tolerating well.     PERTINENT PMH / PSH: DM Type 2 HTN HFrEF Afib   OBJECTIVE:  BP 134/68   Pulse 81   Temp 98.2 F (36.8 C)   Resp 16   Ht 5\' 3"  (1.6 m)   Wt 191 lb 4 oz (86.8 kg)   SpO2 100%   BMI 33.88 kg/m    Physical Exam Vitals reviewed.  Constitutional:      General: She is not in acute distress.    Appearance: Normal appearance. She is obese. She is not ill-appearing, toxic-appearing or diaphoretic.  Eyes:     General:        Right eye: No discharge.        Left eye: No discharge.     Conjunctiva/sclera: Conjunctivae normal.  Cardiovascular:     Rate and Rhythm: Normal rate and regular rhythm.     Heart sounds: Normal heart sounds.  Pulmonary:     Effort: Pulmonary effort is normal.     Breath sounds: Normal breath sounds.  Abdominal:     General: There is no distension.     Palpations: Abdomen is soft.  Musculoskeletal:        General: Normal range of motion.     Right lower leg: No edema.     Left lower leg: No edema.  Skin:    General: Skin is warm and dry.  Neurological:     General: No focal deficit  present.     Mental Status: She is alert and oriented to person, place, and time. Mental status is at baseline.  Psychiatric:        Mood and Affect: Mood normal.        Behavior: Behavior normal.        Thought Content: Thought content normal.        Judgment: Judgment normal.     ASSESSMENT/PLAN:  Type 2 diabetes mellitus with hyperglycemia, with long-term current use of insulin (HCC) Assessment & Plan: Chronic. No hypoglycemic events.   Currently on Lantus 20 units Continue Ozempic to 0.5 mg weekly Will defer futrher management to Endocrinology and follow peripherally   Orders: -     FreeStyle Libre 2 Sensor; Patient is to change the sensor every 14 days as been directed  Dispense: 2 each; Refill: 3  Encounter for immunization -     Flu Vaccine Trivalent High Dose (Fluad)  Current use of insulin (HCC) -     FreeStyle Libre 2 Sensor; Patient is to change the sensor every 14 days as been directed  Dispense: 2 each; Refill: 3  Atrial fibrillation with RVR (  HCC) Assessment & Plan: Chronic.  Rate controlled Now on Xarelto 20 mg daily Carvedilol 25 mg BID  Will defer continued management to Cardiology and follow peripherally   Hypertension associated with diabetes [facility]) Assessment & Plan: Chronic.  Well controlled. Amlodipine discontinued by cardiology Carvedilol 25 mg BID Losartan started by cards but patient not taking Will defer management to Cardiology and follow peripherally    PDMP reviewed  Return in about 6 months (around 03/15/2023) for PCP.  Dana Allan, MD

## 2022-09-15 NOTE — Patient Instructions (Addendum)
It was a pleasure meeting you today. Thank you for allowing me to take part in your health care.  Our goals for today as we discussed include:  Your Cardiologist wants you to stop the Amlodipine and start Losartan  He also increased your dose of Carvedilol. Please follow up with your Cardiologist for future discussions on medications as well as refills  Refill sent for [facility] Follow up with Endocrinology Continue Ozempic 0.5 mg weekly Continue Lantus 20 units in the morning   You received the Flu vaccine today  Follow up in 6 months  If you have any questions or concerns, please do not hesitate to call the office at [phone].  I look forward to our next visit and until then take care and stay safe.  Regards,   Dana Allan, MD   [facility]

## 2022-09-16 ENCOUNTER — Ambulatory Visit: Payer: Medicare Other | Admitting: Family Medicine

## 2022-09-21 ENCOUNTER — Ambulatory Visit: Payer: Medicare Other | Admitting: Nurse Practitioner

## 2022-09-22 ENCOUNTER — Ambulatory Visit: Payer: Medicare Other | Admitting: Cardiology

## 2022-09-22 ENCOUNTER — Telehealth (HOSPITAL_BASED_OUTPATIENT_CLINIC_OR_DEPARTMENT_OTHER): Payer: Self-pay | Admitting: *Deleted

## 2022-09-22 NOTE — Telephone Encounter (Signed)
   Pre-operative Risk Assessment    Patient Name: [name] [name]  DOB: March 10, 1950 MRN: [id number]      Request for Surgical Clearance    Procedure:   Colonoscopy  Date of Surgery:  Clearance 10/20/22                                 Surgeon:  Dr. Charlott Rakes Surgeon's Group or Practice Name:  [facility] Phone number:  [phone] Fax number:  [phone]   Type of Clearance Requested:   - Medical  - Pharmacy:  Hold Rivaroxaban (Xarelto) Not Indicated   Type of Anesthesia:   Propofol   Additional requests/questions:  Patient has upcoming appt with Dr. Jacinto Halim on 10/13/2022. Pre op added to appt notes.   Signed, Emmit Pomfret   09/22/2022, 9:32 AM

## 2022-09-22 NOTE — Telephone Encounter (Signed)
Will update all parties involved.

## 2022-09-22 NOTE — Telephone Encounter (Signed)
Patient with diagnosis of afib on Xarelto for anticoagulation.    Procedure: colonoscopy Date of procedure: 10/20/22  CHA2DS2-VASc Score = 7  This indicates a 11.2% annual risk of stroke. The patient's score is based upon: CHF History: 1 HTN History: 1 Diabetes History: 1 Stroke History: 2 Vascular Disease History: 0 Age Score: 1 Gender Score: 1   Underwent cardioversion on 08/27/22 Stroke 05/2020  CrCl 23mL/min using adjusted body weight Platelet count 223K  Per office protocol, patient can hold Xarelto for 1 day prior to procedure. Resume as soon as safely possible after given elevated CV risk.  **This guidance is not considered finalized until pre-operative APP has relayed final recommendations.**

## 2022-09-22 NOTE — Telephone Encounter (Signed)
Patient has upcoming appointment with Dr. Jacinto Halim on 10/13/22.  He also underwent recent DCCV.  I will defer preoperative cardiac evaluation to Dr. Jacinto Halim at follow-up visit.  Please add preoperative cardiac evaluation to appointment note.  Thank you for your help.  Thomasene Ripple. Britania Shreeve NP-C     09/22/2022, 1:57 PM [facility] [address] Office [phone] Fax [phone]

## 2022-09-29 ENCOUNTER — Encounter: Payer: Self-pay | Admitting: Family Medicine

## 2022-09-29 NOTE — Assessment & Plan Note (Signed)
Chronic.  Rate controlled Now on Xarelto 20 mg daily Carvedilol 25 mg BID  Will defer continued management to Cardiology and follow peripherally

## 2022-09-29 NOTE — Assessment & Plan Note (Signed)
Chronic. No hypoglycemic events.   Currently on Lantus 20 units Continue Ozempic to 0.5 mg weekly Will defer futrher management to Endocrinology and follow peripherally

## 2022-09-29 NOTE — Assessment & Plan Note (Signed)
Chronic.  Well controlled. Amlodipine discontinued by cardiology Carvedilol 25 mg BID Losartan started by cards but patient not taking Will defer management to Cardiology and follow peripherally

## 2022-10-13 ENCOUNTER — Ambulatory Visit: Payer: Medicare Other | Admitting: Cardiology

## 2022-10-15 ENCOUNTER — Telehealth: Payer: Self-pay | Admitting: Cardiology

## 2022-10-15 NOTE — Telephone Encounter (Signed)
Patient was never schedule a f/u or echo when she was a patient at [facility]. I have left her a voicemail to c/b to r/s .

## 2022-10-19 ENCOUNTER — Encounter (HOSPITAL_COMMUNITY): Payer: Self-pay | Admitting: Cardiology

## 2022-10-19 ENCOUNTER — Encounter: Payer: Self-pay | Admitting: Podiatry

## 2022-10-19 ENCOUNTER — Ambulatory Visit (INDEPENDENT_AMBULATORY_CARE_PROVIDER_SITE_OTHER): Payer: Medicare Other | Admitting: Podiatry

## 2022-10-19 DIAGNOSIS — Z7901 Long term (current) use of anticoagulants: Secondary | ICD-10-CM

## 2022-10-19 DIAGNOSIS — B351 Tinea unguium: Secondary | ICD-10-CM | POA: Diagnosis not present

## 2022-10-19 DIAGNOSIS — M79675 Pain in left toe(s): Secondary | ICD-10-CM

## 2022-10-19 DIAGNOSIS — M79674 Pain in right toe(s): Secondary | ICD-10-CM | POA: Diagnosis not present

## 2022-10-19 DIAGNOSIS — E1165 Type 2 diabetes mellitus with hyperglycemia: Secondary | ICD-10-CM

## 2022-10-19 NOTE — Progress Notes (Signed)
This patient presents to the office with chief complaint of long thick nails.   This patient  says there  is  no pain and discomfort in her feet.  This patient says there are long thick painful big toenails   These nails are painful walking and wearing shoes.  . This patient presents  to the office today for treatment of the  long nails.  Patient is diabetic.  General Appearance  Alert, conversant and in no acute stress.  Vascular  Dorsalis pedis and posterior tibial  pulses are palpable  bilaterally.  Capillary return is within normal limits  bilaterally. Temperature is within normal limits  bilaterally.  Neurologic  Senn-Weinstein monofilament wire test within normal limits  bilaterally. Muscle power within normal limits bilaterally.  Nails Thick disfigured discolored nails with subungual debris  hallux nails bilaterally. No evidence of bacterial infection or drainage bilaterally.  Orthopedic  No limitations of motion of motion feet .  No crepitus or effusions noted.  Mild  HAV  B/L.  Skin  normotropic skin with no porokeratosis noted bilaterally.  No signs of infections or ulcers noted.     Onychomycosis  Diabetes with no foot complications  Debride nails x 10.  A diabetic foot exam was performed and there is no evidence of any vascular or neurologic pathology.   RTC 12 weeks    Helane Gunther DPM

## 2022-10-26 ENCOUNTER — Other Ambulatory Visit: Payer: Self-pay | Admitting: Nurse Practitioner

## 2022-10-26 ENCOUNTER — Ambulatory Visit: Payer: Medicare Other | Admitting: Nurse Practitioner

## 2022-10-26 DIAGNOSIS — Z794 Long term (current) use of insulin: Secondary | ICD-10-CM

## 2022-10-26 DIAGNOSIS — E1165 Type 2 diabetes mellitus with hyperglycemia: Secondary | ICD-10-CM

## 2022-10-26 DIAGNOSIS — Z7985 Long-term (current) use of injectable non-insulin antidiabetic drugs: Secondary | ICD-10-CM

## 2022-10-26 MED ORDER — ONETOUCH DELICA PLUS LANCET30G MISC: 6 refills | Status: DC

## 2022-10-26 MED ORDER — FREESTYLE LIBRE 2 SENSOR MISC
3 refills | Status: DC
Start: 2022-10-26 — End: 2022-11-09

## 2022-11-01 ENCOUNTER — Telehealth (HOSPITAL_COMMUNITY): Payer: Self-pay | Admitting: Cardiology

## 2022-11-01 NOTE — Telephone Encounter (Signed)
Just an FYI. We have made several attempts to contact this patient including sending a letter to schedule or reschedule their echocardiogram. We will be removing the patient from the echo WQ.  10/19/22 MAILED LETTER [facility] 10/19/22 [facility] to schedule @ 10:45/[facility] 10/18/22 [facility] to schedule @ 8:47/[facility]W 10/12/22 Patient never called to schedule OV or echo -inbasket sent to [facility] needs done prior to 10/13/22 OV (PCV Closing. Pt called & vm left on 09/29/22. recall entered)      Thank you

## 2022-11-09 ENCOUNTER — Other Ambulatory Visit: Payer: Self-pay

## 2022-11-09 DIAGNOSIS — Z794 Long term (current) use of insulin: Secondary | ICD-10-CM

## 2022-11-09 MED ORDER — FREESTYLE LIBRE 2 SENSOR MISC
3 refills | Status: DC
Start: 2022-11-09 — End: 2022-11-15

## 2022-11-12 ENCOUNTER — Other Ambulatory Visit: Payer: Self-pay | Admitting: Nurse Practitioner

## 2022-11-12 ENCOUNTER — Other Ambulatory Visit: Payer: Self-pay | Admitting: *Deleted

## 2022-11-12 MED ORDER — ACCU-CHEK SOFTCLIX LANCET DEV KIT: PACK | 0 refills | Status: DC

## 2022-11-12 MED ORDER — ACCU-CHEK SOFTCLIX LANCETS MISC: 12 refills | Status: DC

## 2022-11-15 ENCOUNTER — Other Ambulatory Visit: Payer: Self-pay | Admitting: "Endocrinology

## 2022-11-15 ENCOUNTER — Telehealth: Payer: Self-pay | Admitting: Nurse Practitioner

## 2022-11-15 DIAGNOSIS — E1165 Type 2 diabetes mellitus with hyperglycemia: Secondary | ICD-10-CM

## 2022-11-15 DIAGNOSIS — Z794 Long term (current) use of insulin: Secondary | ICD-10-CM

## 2022-11-15 MED ORDER — FREESTYLE LIBRE 3 READER DEVI: 0 refills | Status: DC

## 2022-11-15 MED ORDER — ACCU-CHEK SOFTCLIX LANCETS MISC: 12 refills | Status: DC

## 2022-11-15 NOTE — Telephone Encounter (Signed)
Resent

## 2022-11-15 NOTE — Telephone Encounter (Signed)
Patient said the pharmacy is still showing that her script says 4 times a day, I see that it was sent in for 3. Can you re send for 3x a day

## 2022-11-18 ENCOUNTER — Telehealth: Payer: Self-pay | Admitting: Family Medicine

## 2022-11-18 NOTE — Telephone Encounter (Signed)
Pt called wanting to see if she can get a portable breathing machine because she has a hard time breathing. Pt stated she goes to the ER when this happens but she needs a machine for on the go

## 2022-11-18 NOTE — Telephone Encounter (Signed)
Called pt and scheduled an appointment for 12/07/2022 @ 11:20

## 2022-11-29 ENCOUNTER — Ambulatory Visit: Payer: Medicare Other | Admitting: Cardiology

## 2022-12-07 ENCOUNTER — Ambulatory Visit: Payer: Medicare Other | Admitting: Family Medicine

## 2022-12-17 ENCOUNTER — Encounter: Payer: Self-pay | Admitting: Family Medicine

## 2022-12-17 ENCOUNTER — Other Ambulatory Visit (HOSPITAL_COMMUNITY): Payer: Self-pay

## 2022-12-17 ENCOUNTER — Ambulatory Visit (INDEPENDENT_AMBULATORY_CARE_PROVIDER_SITE_OTHER): Payer: Medicare Other | Admitting: Family Medicine

## 2022-12-17 VITALS — BP 122/66 | HR 74 | Temp 98.2°F | Resp 18 | Ht 63.0 in | Wt 193.1 lb

## 2022-12-17 DIAGNOSIS — Z794 Long term (current) use of insulin: Secondary | ICD-10-CM

## 2022-12-17 DIAGNOSIS — I5042 Chronic combined systolic (congestive) and diastolic (congestive) heart failure: Secondary | ICD-10-CM

## 2022-12-17 DIAGNOSIS — R0609 Other forms of dyspnea: Secondary | ICD-10-CM

## 2022-12-17 DIAGNOSIS — N1831 Chronic kidney disease, stage 3a: Secondary | ICD-10-CM | POA: Diagnosis not present

## 2022-12-17 DIAGNOSIS — D509 Iron deficiency anemia, unspecified: Secondary | ICD-10-CM

## 2022-12-17 DIAGNOSIS — E1159 Type 2 diabetes mellitus with other circulatory complications: Secondary | ICD-10-CM

## 2022-12-17 DIAGNOSIS — E1165 Type 2 diabetes mellitus with hyperglycemia: Secondary | ICD-10-CM | POA: Diagnosis not present

## 2022-12-17 DIAGNOSIS — E1169 Type 2 diabetes mellitus with other specified complication: Secondary | ICD-10-CM

## 2022-12-17 DIAGNOSIS — R6889 Other general symptoms and signs: Secondary | ICD-10-CM

## 2022-12-17 DIAGNOSIS — Z91148 Patient's other noncompliance with medication regimen for other reason: Secondary | ICD-10-CM

## 2022-12-17 DIAGNOSIS — I152 Hypertension secondary to endocrine disorders: Secondary | ICD-10-CM | POA: Diagnosis not present

## 2022-12-17 DIAGNOSIS — E785 Hyperlipidemia, unspecified: Secondary | ICD-10-CM

## 2022-12-17 LAB — CBC WITH DIFFERENTIAL/PLATELET
Basophils Absolute: 0.1 10*3/uL (ref 0.0–0.1)
Basophils Relative: 1 % (ref 0.0–3.0)
Eosinophils Absolute: 0.2 10*3/uL (ref 0.0–0.7)
Eosinophils Relative: 2.7 % (ref 0.0–5.0)
HCT: 46.8 % — ABNORMAL HIGH (ref 36.0–46.0)
Hemoglobin: 14.7 g/dL (ref 12.0–15.0)
Lymphocytes Relative: 35.9 % (ref 12.0–46.0)
Lymphs Abs: 2.3 10*3/uL (ref 0.7–4.0)
MCHC: 31.3 g/dL (ref 30.0–36.0)
MCV: 79.9 fL (ref 78.0–100.0)
Monocytes Absolute: 0.6 10*3/uL (ref 0.1–1.0)
Monocytes Relative: 8.6 % (ref 3.0–12.0)
Neutro Abs: 3.4 10*3/uL (ref 1.4–7.7)
Neutrophils Relative %: 51.8 % (ref 43.0–77.0)
Platelets: 167 10*3/uL (ref 150.0–400.0)
RBC: 5.86 Mil/uL — ABNORMAL HIGH (ref 3.87–5.11)
RDW: 16.9 % — ABNORMAL HIGH (ref 11.5–15.5)
WBC: 6.5 10*3/uL (ref 4.0–10.5)

## 2022-12-17 LAB — COMPREHENSIVE METABOLIC PANEL
ALT: 18 U/L (ref 0–35)
AST: 20 U/L (ref 0–37)
Albumin: 4 g/dL (ref 3.5–5.2)
Alkaline Phosphatase: 99 U/L (ref 39–117)
BUN: 21 mg/dL (ref 6–23)
CO2: 26 meq/L (ref 19–32)
Calcium: 9.8 mg/dL (ref 8.4–10.5)
Chloride: 98 meq/L (ref 96–112)
Creatinine, Ser: 0.96 mg/dL (ref 0.40–1.20)
GFR: 59.72 mL/min — ABNORMAL LOW (ref >60.00)
Glucose, Bld: 295 mg/dL — ABNORMAL HIGH (ref 70–99)
Potassium: 4.7 meq/L (ref 3.5–5.1)
Sodium: 134 meq/L — ABNORMAL LOW (ref 135–145)
Total Bilirubin: 0.5 mg/dL (ref 0.2–1.2)
Total Protein: 8.2 g/dL (ref 6.0–8.3)

## 2022-12-17 LAB — HEMOGLOBIN A1C: Hgb A1c MFr Bld: 14.4 % — ABNORMAL HIGH (ref 4.6–6.5)

## 2022-12-17 LAB — BRAIN NATRIURETIC PEPTIDE: Pro B Natriuretic peptide (BNP): 84 pg/mL (ref 0.0–100.0)

## 2022-12-17 NOTE — Progress Notes (Unsigned)
SUBJECTIVE:   Chief Complaint  Patient presents with  . Medical Management of Chronic Issues    Wants a portable O2 machine   HPI Presents to clinic for chronic disease management  Discussed the use of AI scribe software for clinical note transcription with the patient, who gave verbal consent to proceed.  History of Present Illness The patient presents with complaints of fatigue and difficulty breathing, particularly when ambulating. They attribute these symptoms to a lack of sufficient oxygen, rather than cardiac issues. They also report a history of sinus infections and frequent nosebleeds. The patient has previously used oxygen therapy, but notes that it tends to dry out their nasal passages. They express a desire to resume oxygen therapy, but only during periods of activity.  The patient also reports a history of wheezing, but is not currently under the care of a pulmonologist. They have a family member who works in cardiology and has suggested the use of an inhaler. The patient has considered this, particularly after an episode where they felt they could not breathe and contemplated going to the emergency room.  The patient is also experiencing difficulties with their medication regimen, specifically with obtaining their medications. They express frustration with local pharmacies and have been exploring the option of mail-order pharmacies. They are particularly concerned about obtaining their diabetes medications, as they have been told by their endocrinologist that they will not be seen until they have a sensor for their glucose monitoring system. The patient is currently using Lantus for their diabetes management, but has stopped using Ozempic due to personal concerns about the medication.  The patient has a history of blood clots and has previously been hospitalized. During their hospital stay, they were seen by a variety of specialists, but have not followed up with them since being  discharged. They have been managing their blood pressure with medication and report that their blood pressure is currently well-controlled.     PERTINENT PMH / PSH: As above  OBJECTIVE:  BP 122/66   Pulse 74   Temp 98.2 F (36.8 C)   Resp 18   Ht 5\' 3"  (1.6 m)   Wt 193 lb 2 oz (87.6 kg)   SpO2 97%   BMI 34.21 kg/m    Physical Exam Vitals reviewed.  Constitutional:      General: She is not in acute distress.    Appearance: Normal appearance. She is obese. She is not ill-appearing, toxic-appearing or diaphoretic.  Eyes:     General:        Right eye: No discharge.        Left eye: No discharge.     Conjunctiva/sclera: Conjunctivae normal.  Cardiovascular:     Rate and Rhythm: Normal rate and regular rhythm.     Heart sounds: Normal heart sounds.  Pulmonary:     Effort: Pulmonary effort is normal.     Breath sounds: Normal breath sounds. No wheezing or rhonchi.  Abdominal:     General: Bowel sounds are normal.  Musculoskeletal:        General: Normal range of motion.  Skin:    General: Skin is warm and dry.  Neurological:     General: No focal deficit present.     Mental Status: She is alert and oriented to person, place, and time. Mental status is at baseline.  Psychiatric:        Mood and Affect: Mood normal.        Behavior: Behavior normal.  Thought Content: Thought content normal.        Judgment: Judgment normal.       12/17/2022   10:03 AM 09/15/2022    2:51 PM 06/10/2022    4:16 PM 05/14/2022    8:29 AM 04/13/2022    1:41 PM  Depression screen PHQ 2/9  Decreased Interest 0 0 0 0 0  Down, Depressed, Hopeless 0 0 0 0 0  PHQ - 2 Score 0 0 0 0 0  Altered sleeping 0 1 1    Tired, decreased energy 0 0 1    Change in appetite 0 1 0    Feeling bad or failure about yourself  0 0 0    Trouble concentrating 0 0 0    Moving slowly or fidgety/restless 0  0    Suicidal thoughts 0 0 0    PHQ-9 Score 0 2 2    Difficult doing work/chores Not difficult at all Not  difficult at all Somewhat difficult        12/17/2022   10:03 AM 09/15/2022    2:52 PM 06/10/2022    4:17 PM 04/13/2022    1:41 PM  GAD 7 : Generalized Anxiety Score  Nervous, Anxious, on Edge 0 0 0 0  Control/stop worrying 0 0 0 0  Worry too much - different things 0 0 0 0  Trouble relaxing 0 0 1 0  Restless 0 0 0 0  Easily annoyed or irritable 0 0 1 0  Afraid - awful might happen 0 0 0 0  Total GAD 7 Score 0 0 2 0  Anxiety Difficulty Not difficult at all Not difficult at all Not difficult at all Not difficult at all    ASSESSMENT/PLAN:  Hypertension associated with diabetes (HCC) -     AMB Referral [facility] Care Management -     Comprehensive metabolic panel  Hyperlipidemia associated with type 2 diabetes mellitus (HCC) -     AMB Referral [facility] Care Management  Stage 3a chronic kidney disease (HCC) -     AMB Referral [facility] Care Management  Type 2 diabetes mellitus with hyperglycemia, with long-term current use of insulin (HCC) -     Hemoglobin A1c  Increased oxygen demand  Iron deficiency anemia, unspecified iron deficiency anemia type -     CBC with Differential/Platelet  Dyspnea on exertion -     Brain natriuretic peptide -     ECHOCARDIOGRAM COMPLETE; Future  Chronic combined systolic and diastolic CHF (congestive heart failure) (HCC) -     Brain natriuretic peptide -     ECHOCARDIOGRAM COMPLETE; Future      Medication Access Difficulty obtaining necessary medications, including diabetes supplies, from local pharmacies. Expressed interest in mail order pharmacy. -Refer to pharmacy [facility] or [facility]) for assistance in setting up mail order for medications. -Check patient's insurance coverage and clarify any issues with reported additional insurance.  Diabetes Management   General Health Maintenance -Check hemoglobin levels. -Continue current blood pressure management regimen.      PDMP reviewed***  No follow-ups on file.  Dana Allan, MD

## 2022-12-17 NOTE — Patient Instructions (Addendum)
It was a pleasure meeting you today. Thank you for allowing me to take part in your health care.  Our goals for today as we discussed include:  Will send in request for home oxygen  We will get some labs today.  If they are abnormal or we need to do something about them, I will call you.  If they are normal, I will send you a message on MyChart (if it is active) or a letter in the mail.  If you don't hear from [place] in 2 weeks, please call the office at the number below.   Referral sent for pharmacy to help with mail order and management of medications  Ultrasound ordered for your heart. They will call you with an appointment.   This is a list of the screening recommended for you and due dates:  Health Maintenance  Topic Date Due   Colon Cancer Screening  Never done   COVID-19 Vaccine (3 - Moderna risk series) 09/04/2019   Yearly kidney health urinalysis for diabetes  06/10/2022   Complete foot exam   06/10/2022   Hemoglobin A1C  02/25/2023   Eye exam for diabetics  04/21/2023   Medicare Annual Wellness Visit  05/14/2023   Yearly kidney function blood test for diabetes  08/25/2023   Mammogram  06/03/2024   DTaP/Tdap/Td vaccine (2 - Td or Tdap) 05/14/2027   Pneumonia Vaccine  Completed   Flu Shot  Completed   DEXA scan (bone density measurement)  Completed   Hepatitis C Screening  Completed   Zoster (Shingles) Vaccine  Completed   HPV Vaccine  Aged Out     Follow up in 6 months  If you have any questions or concerns, please do not hesitate to call the office at [phone].  I look forward to our next visit and until then take care and stay safe.  Regards,   Dana Allan, MD   [facility]

## 2022-12-19 NOTE — Assessment & Plan Note (Signed)
Reports fatigue and shortness of breath with ambulation. No current pulmonology follow-up. History of amiodarone use and blood around the lungs. No current wheezing noted on exam. -Perform ambulatory oxygen test to assess need for supplemental oxygen. Meets criteria for home oxygen -Consider referral to pulmonology for further evaluation. Patient declined -ECHO ordered

## 2022-12-19 NOTE — Assessment & Plan Note (Signed)
Reports difficulty obtaining necessary supplies for glucose monitoring. Currently not seeing endocrinologist due to issues obtaining glucose sensor. -Work with pharmacy to resolve issues with obtaining glucose monitoring supplies. -Consider re-establishing care with endocrinology for ongoing diabetes management. -Check labs today

## 2022-12-20 ENCOUNTER — Encounter: Payer: Self-pay | Admitting: Family Medicine

## 2022-12-20 ENCOUNTER — Telehealth: Payer: Self-pay

## 2022-12-20 DIAGNOSIS — R6889 Other general symptoms and signs: Secondary | ICD-10-CM | POA: Insufficient documentation

## 2022-12-20 DIAGNOSIS — Z91148 Patient's other noncompliance with medication regimen for other reason: Secondary | ICD-10-CM | POA: Insufficient documentation

## 2022-12-20 DIAGNOSIS — D509 Iron deficiency anemia, unspecified: Secondary | ICD-10-CM | POA: Insufficient documentation

## 2022-12-20 NOTE — Assessment & Plan Note (Signed)
Ambulatory oxygen assessment completed and meets criteria for home oxygen 2-3L

## 2022-12-20 NOTE — Progress Notes (Signed)
   Care Guide Note  12/20/2022 Name: [name] [name] MRN: [id number] DOB: 1950/06/10  Referred by: Dana Allan, MD Reason for referral : Care Coordination (Outreach to schedule with Pharm d )   [name] [name] is a 72 y.o. year old female who is a primary care patient of Dana Allan, MD. [name] [name] was referred to the pharmacist for assistance related to DM.    Successful contact was made with the patient to discuss pharmacy services including being ready for the pharmacist to call at least 5 minutes before the scheduled appointment time, to have medication bottles and any blood sugar or blood pressure readings ready for review. The patient agreed to meet with the pharmacist via with the pharmacist via telephone visit on (date/time).  12/21/2022  Penne Lash , RMA     [place]  [facility], [facility] Guide  Direct Dial: [phone]  Website: [email or web address]

## 2022-12-20 NOTE — Assessment & Plan Note (Signed)
Difficulty obtaining necessary medications, including diabetes supplies, from local pharmacies. Expressed interest in mail order pharmacy. -Refer to pharmacy [facility]) for assistance in setting up mail order for medications. -Check patient's insurance coverage and clarify any issues with reported additional insurance.

## 2022-12-21 ENCOUNTER — Other Ambulatory Visit: Payer: Medicare Other | Admitting: Pharmacist

## 2022-12-21 DIAGNOSIS — E1165 Type 2 diabetes mellitus with hyperglycemia: Secondary | ICD-10-CM

## 2022-12-21 MED ORDER — FREESTYLE LIBRE 3 PLUS SENSOR MISC
11 refills | Status: AC
Start: 2022-12-21 — End: ?
  Filled 2022-12-21 – 2023-02-04 (×3): qty 2, 30d supply, fill #0
  Filled 2023-03-09: qty 2, 28d supply, fill #0

## 2022-12-21 MED ORDER — BLOOD GLUCOSE TEST VI STRP
ORAL_STRIP | 6 refills | Status: AC
Start: 2022-12-21 — End: ?
  Filled 2022-12-21 – 2022-12-31 (×2): qty 100, 25d supply, fill #0
  Filled 2022-12-31: qty 100, 50d supply, fill #0
  Filled 2023-02-04: qty 100, 25d supply, fill #1
  Filled 2023-03-11: qty 100, 25d supply, fill #2
  Filled 2023-04-05: qty 100, 25d supply, fill #3
  Filled 2023-04-30: qty 100, 25d supply, fill #4

## 2022-12-21 MED ORDER — ACCU-CHEK SOFTCLIX LANCET DEV KIT
PACK | 0 refills | Status: AC
Start: 2022-12-21 — End: ?
  Filled 2022-12-21: qty 1, fill #0
  Filled 2023-01-03: qty 1, 30d supply, fill #0

## 2022-12-21 MED ORDER — FREESTYLE LIBRE 3 READER DEVI
1.0000 | Freq: Every day | 0 refills | Status: AC
  Filled 2022-12-21: qty 1, fill #0
  Filled 2023-02-04 – 2023-03-09 (×2): qty 1, 30d supply, fill #0

## 2022-12-21 MED ORDER — ACCU-CHEK SOFTCLIX LANCETS MISC
12 refills | Status: AC
Start: 2022-12-21 — End: ?
  Filled 2022-12-21: qty 100, 33d supply, fill #0
  Filled 2022-12-31: qty 100, 25d supply, fill #0
  Filled 2023-02-04: qty 100, 25d supply, fill #1
  Filled 2023-03-11: qty 100, 25d supply, fill #2
  Filled 2023-04-05: qty 100, 25d supply, fill #3
  Filled 2023-04-30: qty 100, 25d supply, fill #4

## 2022-12-21 NOTE — Progress Notes (Signed)
12/21/2022 Name: [name] [name] MRN: [id number] DOB: [birth year]  Subjective  Chief Complaint  Patient presents with   Medication Access    Reason for visit: [name] [name] is a 72 y.o. year old female who presented for a telephone visit.   They were referred to the pharmacist by their PCP for assistance in managing  medication reconciliation and comprehensive chronic disease management .   [place] Medicaid and Medicare (working on changing address to [place]) Care Team: Primary Care Provider: Dana Allan, MD  Reason for visit: ?  [name] [name] is a 72 y.o. female who presents today for a phone visit with the pharmacist for a medication review/reconciliation visit.    Medication Access/Adherence: ?  Prescription drug coverage: Payor: MEDICARE / Plan: MEDICARE PART A AND B / Product Type: *No Product type* / . No prescription drug benefit per patient report.   - Reports that all medications are affordable: Yes , taking generics - Medication packaging: Pill bottles (does not use adherence packaging). Reviewed adherence packing, patient did not have strong preference.  - Patient manages their own medications/refills: Yes   Patient reports that her main concern with medication access has been her BG testing supplies. Previously established on [place] CGM though they have stopped coming through the mail. She also reports that her pharmacy will not fill lancets/lancing device/test strips as they are 'not covered'. She reports presenting to various pharmacies (eg [facility], [facility], etc.) who have all told her something similar (that she has a prescription drug plan through [facility] and therefore they cannot bill through her Medicaid). Patient is frustrated and confused, states she only has Medicare A/B and Medicaid.    Patient currently HAS all medication pill bottles with her at the time of today's visit.    Current Outpatient Medications on File Prior to Visit Notes  Medication  Sig    Accu-Chek Softclix Lancets lancets Use as instructed to monitor glucose 3 times daily Reports pharmacy stated this was not covered despite changing instruction for TID checks (Medicaid will not pay for QID checks)   amLODipine (NORVASC) 10 MG tablet Take 10 mg by mouth at bedtime. Taking confirms dosing via medication bottle at home   atorvastatin (LIPITOR) 80 MG tablet TAKE 1 TABLET(80 MG) BY MOUTH DAILY Taking confirms dosing via medication bottle at home   carvedilol (COREG) 25 MG tablet Take 1 tablet (25 mg total) by mouth 2 (two) times daily with a meal. Taking confirms dosing via medication bottle at home   diphenhydrAMINE (ALLERGY RELIEF) 25 mg capsule Take 25 mg by mouth at bedtime. Taking  Reports she prefers to take Allegra but pharmacy would not cover it. Not aware this could also be purchased generically.    furosemide (LASIX) 40 MG tablet Take 40 mg by mouth daily as needed for fluid. Confirms only PRN use.  Has not used in the last 2 weeks.    glucose blood (ACCU-CHEK GUIDE) test strip 1 each by Other route 2 (two) times daily. Use as instructed bid E11.65 Reports pharmacy stated this was not covered despite changing instruction for TID checks (Medicaid will not pay for QID checks)   insulin glargine (LANTUS SOLOSTAR) 100 UNIT/ML Solostar Pen Inject 20 Units into the skin at bedtime. ADMINISTER 35 UNITS UNDER THE SKIN in am (Patient taking differently: Inject 20 Units into the skin at bedtime.) Taking Confirms 20 units daily. Does note lows occasionally. Also notes that when she has lows they are sometimes followed by vomiting.  Insulin Pen Needle (BD PEN NEEDLE MICRO U/F) 32G X 6 MM MISC Use to inject insulin daily Has 4 needles left from previous Rx Reports pharmacy stated this was not covered despite changing instruction for TID checks (Medicaid will not pay for QID checks)   Lancets Misc. (ACCU-CHEK SOFTCLIX LANCET DEV) KIT Use to check glucose 4 times daily Reports  pharmacy stated this was not covered despite changing instruction for TID checks (Medicaid will not pay for QID checks)   losartan (COZAAR) 25 MG tablet Take 1 tablet (25 mg total) by mouth every evening. (Patient not taking: Reported on 08/20/2022) Not Taking Patient reports that she never took this medication. Reports her cardiologist put her on this but never told her, so she never started it. Reports she was upset that she was not told or educated on this medication. Also denies having anything wrong with her heart. Also notes dislike that the cardiologist wanted to send her for "electric shock that will damage my heart".   Seems lack of disease state education is likely the main culprit rather than the medication itself.   No longer sees this cardiologist.  Has not established with another cardiologist at this time.    magnesium oxide (MAG-OX) 400 MG tablet Take 400 mg by mouth in the morning. Taking confirms dosing via medication bottle at home   Multiple Vitamins-Iron (DAILY-VITAMIN/IRON PO) Take 1 tablet by mouth daily. Taking 325 mg iron confirms dosing via medication bottle at home   pantoprazole (PROTONIX) 40 MG tablet TAKE 1 TABLET(40 MG) BY MOUTH DAILY Taking confirms dosing via medication bottle at home   rivaroxaban (XARELTO) 20 MG TABS tablet Take 1 tablet (20 mg total) by mouth daily with supper. Taking confirms dosing via medication bottle at home   vitamin E 180 MG (400 UNITS) capsule Take 400 Units by mouth in the morning. Taking confirms dosing via medication bottle at home    Assessment & Plan   Medication Access:  Insurance: Medicare A/B, [place] Medicaid (previously had [place] Medicaid). Denies part D coverage.  States that a couple community pharmacies have been telling her she "has a Lobbyist, AARP" which she denies. Multiple pharmacies have told her they are unable to fill BG Testing supplies with her insurance. She thinks this may have to do with her moving from [place] to  [place] (reports [place] Medicaid under her [place] address, Medicare through her [place] address) [place] Address for Medicare  -  [address] [place], [place] [address] [place] address for [place] Medicaid  Test strips/lancets at [facility] to see if/what rejection message is  No longer receiving CGM (Previously Libre 3 - Using SmartPhone not a reader) Libre 3 Plus to [facility] to see if covered   Upon getting medications settled, will schedule follow up with patient for chronic disease state management, discussed with PCP.   Future Appointments  Date Time Provider Department Center  01/20/2023  2:15 PM Helane Gunther, DPM [facility]  05/16/2023  8:45 AM [facility] ANNUAL WELLNESS VISIT [facility]  06/20/2023  8:40 AM Dana Allan, MD [facility]    Loree Fee, PharmD Clinical Pharmacist [facility] [phone]

## 2022-12-22 ENCOUNTER — Other Ambulatory Visit: Payer: Self-pay

## 2022-12-22 ENCOUNTER — Other Ambulatory Visit (HOSPITAL_COMMUNITY): Payer: Self-pay

## 2022-12-29 ENCOUNTER — Other Ambulatory Visit: Payer: Self-pay | Admitting: Family Medicine

## 2022-12-29 ENCOUNTER — Other Ambulatory Visit (HOSPITAL_COMMUNITY): Payer: Self-pay

## 2022-12-29 DIAGNOSIS — K219 Gastro-esophageal reflux disease without esophagitis: Secondary | ICD-10-CM

## 2022-12-30 ENCOUNTER — Other Ambulatory Visit (HOSPITAL_COMMUNITY): Payer: Self-pay

## 2022-12-30 ENCOUNTER — Telehealth: Payer: Self-pay

## 2022-12-30 NOTE — Telephone Encounter (Signed)
Pharmacy Patient Advocate Encounter   Received notification from Physician's Office that prior authorization for FREESTYLE LIBRE 3 PLUS is required/requested.   Insurance verification completed.   The patient is insured through [facility] MEDICAID .   Per test claim: PA required; PA submitted to above mentioned insurance via [facility] Tracks Key/confirmation #/EOC [id number] W Status is pending

## 2022-12-30 NOTE — Telephone Encounter (Signed)
Pharmacy Patient Advocate Encounter  Received notification from [facility] MEDICAID that Prior Authorization for FREESTYLE LIBRE 3 PLUS  has been APPROVED from 12/30/22 to 06/28/23   PA #/Case ID/Reference #: [id number]

## 2022-12-31 ENCOUNTER — Other Ambulatory Visit: Payer: Self-pay | Admitting: Pharmacist

## 2022-12-31 ENCOUNTER — Other Ambulatory Visit: Payer: Self-pay

## 2022-12-31 DIAGNOSIS — Z794 Long term (current) use of insulin: Secondary | ICD-10-CM

## 2022-12-31 MED ORDER — FREESTYLE LIBRE 3 SENSOR MISC
2 refills | Status: DC
Start: 2022-12-31 — End: 2023-03-21
  Filled 2022-12-31: qty 2, 28d supply, fill #0
  Filled 2023-02-01: qty 2, 28d supply, fill #1
  Filled 2023-02-22: qty 2, 28d supply, fill #2

## 2023-01-03 ENCOUNTER — Other Ambulatory Visit (HOSPITAL_COMMUNITY): Payer: Self-pay

## 2023-01-03 ENCOUNTER — Other Ambulatory Visit: Payer: Self-pay | Admitting: Pharmacist

## 2023-01-03 DIAGNOSIS — E1165 Type 2 diabetes mellitus with hyperglycemia: Secondary | ICD-10-CM

## 2023-01-03 MED ORDER — ACCU-CHEK GUIDE ME W/DEVICE KIT
PACK | 0 refills | Status: AC
Start: 2023-01-03 — End: ?
  Filled 2023-01-03 – 2023-02-04 (×2): qty 1, 30d supply, fill #0

## 2023-01-04 ENCOUNTER — Other Ambulatory Visit (HOSPITAL_COMMUNITY): Payer: Self-pay

## 2023-01-20 ENCOUNTER — Ambulatory Visit (INDEPENDENT_AMBULATORY_CARE_PROVIDER_SITE_OTHER): Payer: Medicare Other | Admitting: Podiatry

## 2023-01-20 ENCOUNTER — Encounter: Payer: Self-pay | Admitting: Podiatry

## 2023-01-20 DIAGNOSIS — E1165 Type 2 diabetes mellitus with hyperglycemia: Secondary | ICD-10-CM | POA: Diagnosis not present

## 2023-01-20 DIAGNOSIS — M79674 Pain in right toe(s): Secondary | ICD-10-CM

## 2023-01-20 DIAGNOSIS — M79675 Pain in left toe(s): Secondary | ICD-10-CM | POA: Diagnosis not present

## 2023-01-20 DIAGNOSIS — B351 Tinea unguium: Secondary | ICD-10-CM

## 2023-01-20 DIAGNOSIS — Z7901 Long term (current) use of anticoagulants: Secondary | ICD-10-CM | POA: Diagnosis not present

## 2023-01-20 NOTE — Progress Notes (Signed)
 This patient presents to the office with chief complaint of long thick nails.   This patient  says there  is  no pain and discomfort in her feet.  This patient says there are long thick painful big toenails   These nails are painful walking and wearing shoes.  . This patient presents  to the office today for treatment of the  long nails.  Patient is diabetic.  General Appearance  Alert, conversant and in no acute stress.  Vascular  Dorsalis pedis and posterior tibial  pulses are palpable  bilaterally.  Capillary return is within normal limits  bilaterally. Temperature is within normal limits  bilaterally.  Neurologic  Senn-Weinstein monofilament wire test within normal limits  bilaterally. Muscle power within normal limits bilaterally.  Nails Thick disfigured discolored nails with subungual debris  hallux nails bilaterally. No evidence of bacterial infection or drainage bilaterally.  Orthopedic  No limitations of motion of motion feet .  No crepitus or effusions noted.  Mild  HAV  B/L.  Skin  normotropic skin with no porokeratosis noted bilaterally.  No signs of infections or ulcers noted.     Onychomycosis  Diabetes with no foot complications  Debride nails x 10.     RTC 12 weeks    Helane Gunther DPM

## 2023-01-24 ENCOUNTER — Other Ambulatory Visit: Payer: Self-pay | Admitting: Family Medicine

## 2023-01-25 NOTE — Telephone Encounter (Signed)
 There is confusion about medications with the patient. I am not sure she should be on this. Latest PCP visit also does not show this. Hence no need to refill and patient to bring all her medications to visit. Also every time to take her meds to all appointments

## 2023-01-31 ENCOUNTER — Other Ambulatory Visit: Payer: Self-pay | Admitting: Family Medicine

## 2023-02-01 ENCOUNTER — Other Ambulatory Visit: Payer: Medicare Other

## 2023-02-01 ENCOUNTER — Other Ambulatory Visit (HOSPITAL_COMMUNITY): Payer: Self-pay

## 2023-02-01 DIAGNOSIS — I484 Atypical atrial flutter: Secondary | ICD-10-CM

## 2023-02-01 DIAGNOSIS — E1165 Type 2 diabetes mellitus with hyperglycemia: Secondary | ICD-10-CM

## 2023-02-01 DIAGNOSIS — E1169 Type 2 diabetes mellitus with other specified complication: Secondary | ICD-10-CM

## 2023-02-01 DIAGNOSIS — I428 Other cardiomyopathies: Secondary | ICD-10-CM

## 2023-02-01 DIAGNOSIS — Z794 Long term (current) use of insulin: Secondary | ICD-10-CM

## 2023-02-01 NOTE — Progress Notes (Signed)
02/01/2023 Name: [name] [name] MRN: [id number] DOB: Apr 22, 1950  Subjective  Chief Complaint  Patient presents with   Medication Access    Reason for visit: [name] [name] is a 73 y.o. year old female who presented for a telephone visit.   They were referred to the pharmacist by their PCP for assistance in managing  medication reconciliation and comprehensive chronic disease management .   Care Team: Primary Care Provider: Dana Allan, MD  Reason for visit: ?  [name] [name] is a 73 y.o. female who presents today for a phone visit with the pharmacist for a medication review/reconciliation visit.    Medication Access/Adherence: ?  Prescription drug coverage: Payor: MEDICARE / Plan: MEDICARE PART A AND B / Product Type: *No Product type* / . Coverage:  [place] Medicaid and Medicare (working on changing address to [place])  - Reports that all medications are affordable: Yes , taking generics - Medication packaging: Pill bottles (does not use adherence packaging). - Patient manages their own medications/refills: Yes   Since Last Visit: Patient confirms receiving her Josephine Igo supplies in the mail without issues. She is using a reader and therefore is not linked with clinic for data sharing. She notes she is out of sensors and needs a new prescription sent. Denies calling the pharmacy for refills. Was unaware he Rx has many refills on it.   She states preference today for having all of her medications switched to [facility] pharmacy for delivery to her home. She does have her pill bottles during our discussion today. Had patient read each pill bottle to me to ensure no old meds/dosing present in her possession. Reports taking 30 units of Lantus daily despite notes in med list suggesting dose reduction to 20 units (and her own report of 20 units earlier this month).   Denies issues with getting her Josephine Igo up and working again. Cannot recall specifics regarding her BG patterns.  She  recalls:  - Lowest 60s (usually at night, a couple times per week) - Highest 300  Not checking blood pressure at home though does have a cuff available to her. States she has checked once in the past few weeks and believes her reading was somewhere around 170/80 mmHg.    Assessment & Plan  Medication Access:  Insurance: Medicare A/B, [place] Medicaid (previously had [place] Medicaid). Denies part D coverage.  [place] Medicaid under her [employer] address, Medicare through her [place] address (moved to [place] from [place]) [place] Address for [employer]  -  [address] [place], [place] [address] [place] address for [facility] Medicaid   Patient would like remainder of her medications sent to [facility] for Mail Order.  Advised her to call [facility] to request refill on Lacey sensors. Provided pharmacy # Call [facility] to set up Mail Order for chronic meds Medications reviewed with patient. Refills pended to PCP ([facility]).   Blood Pressure - Has BP cuff though not checking regularly. Recalls a home reading around 170/80 mmHg though has been out of amlodipine. Thought her pharmacy would sent it automatically.  - Previously refused losartan. Removed from list (will have discussion w her about adding ACE/ARB at next visit once BP readings available) - Meds set up w [facility] pharmacy today.  - Start checking BP ~every other day. Phone visit to review in 2 weeks.   Follow up scheduled 2 weeks for a more comprehensive chronic disease state pharmacotherapy visit (CHF, HTN, DM, HLD). Patient agreeable.   Future Appointments  Date Time Provider Department Center  02/15/2023 10:00 AM [facility] PHARMACIST [facility]  04/21/2023  2:30 PM Helane Gunther, DPM [facility]  05/16/2023  8:45 AM [facility] ANNUAL WELLNESS VISIT [facility]  06/20/2023  8:40 AM Dana Allan, MD [facility]    Loree Fee, PharmD Clinical Pharmacist [facility] [phone]

## 2023-02-02 ENCOUNTER — Other Ambulatory Visit: Payer: Self-pay

## 2023-02-02 ENCOUNTER — Other Ambulatory Visit (HOSPITAL_COMMUNITY): Payer: Self-pay

## 2023-02-02 MED ORDER — LANTUS SOLOSTAR 100 UNIT/ML ~~LOC~~ SOPN
25.0000 [IU] | PEN_INJECTOR | Freq: Every day | SUBCUTANEOUS | 3 refills | Status: AC
Start: 2023-02-02 — End: ?
  Filled 2023-02-02 – 2023-02-03 (×2): qty 30, 85d supply, fill #0
  Filled 2023-04-22: qty 30, 85d supply, fill #1
  Filled 2023-05-02 – 2023-09-19 (×8): qty 30, 85d supply, fill #2
  Filled 2023-12-07: qty 30, 85d supply, fill #3

## 2023-02-02 MED ORDER — AMLODIPINE BESYLATE 10 MG PO TABS
10.0000 mg | ORAL_TABLET | Freq: Every day | ORAL | 3 refills | Status: DC
  Filled 2023-02-02 – 2023-02-03 (×2): qty 90, 90d supply, fill #0

## 2023-02-02 MED ORDER — RIVAROXABAN 20 MG PO TABS
20.0000 mg | ORAL_TABLET | Freq: Every day | ORAL | 3 refills | Status: DC
  Filled 2023-02-02 – 2023-02-03 (×2): qty 90, 90d supply, fill #0
  Filled 2023-05-16: qty 90, 90d supply, fill #1

## 2023-02-02 MED ORDER — ATORVASTATIN CALCIUM 80 MG PO TABS
80.0000 mg | ORAL_TABLET | Freq: Every day | ORAL | 3 refills | Status: AC
Start: 2023-02-02 — End: ?
  Filled 2023-02-02: qty 90, fill #0
  Filled 2023-02-03 – 2023-02-04 (×2): qty 90, 90d supply, fill #0

## 2023-02-02 MED ORDER — INSULIN PEN NEEDLE 32G X 6 MM MISC
3 refills | Status: AC
  Filled 2023-02-02: qty 100, fill #0
  Filled 2023-02-03: qty 100, 90d supply, fill #0
  Filled 2023-04-27: qty 100, 90d supply, fill #1
  Filled 2023-08-11: qty 100, 90d supply, fill #2
  Filled 2023-11-02: qty 100, 90d supply, fill #3

## 2023-02-02 MED ORDER — CARVEDILOL 25 MG PO TABS
25.0000 mg | ORAL_TABLET | Freq: Two times a day (BID) | ORAL | 3 refills | Status: AC
Start: 2023-02-02 — End: ?
  Filled 2023-02-02 – 2023-02-03 (×3): qty 180, 90d supply, fill #0
  Filled 2023-04-27: qty 180, 90d supply, fill #1
  Filled 2023-07-26: qty 180, 90d supply, fill #2
  Filled 2023-10-24 – 2023-10-31 (×8): qty 180, 90d supply, fill #3

## 2023-02-03 ENCOUNTER — Other Ambulatory Visit: Payer: Self-pay

## 2023-02-03 ENCOUNTER — Other Ambulatory Visit (HOSPITAL_BASED_OUTPATIENT_CLINIC_OR_DEPARTMENT_OTHER): Payer: Self-pay

## 2023-02-03 ENCOUNTER — Other Ambulatory Visit (HOSPITAL_COMMUNITY): Payer: Self-pay

## 2023-02-04 ENCOUNTER — Other Ambulatory Visit: Payer: Self-pay

## 2023-02-15 ENCOUNTER — Other Ambulatory Visit: Payer: Medicare Other | Admitting: Pharmacist

## 2023-02-15 DIAGNOSIS — E1165 Type 2 diabetes mellitus with hyperglycemia: Secondary | ICD-10-CM

## 2023-02-15 DIAGNOSIS — I4891 Unspecified atrial fibrillation: Secondary | ICD-10-CM

## 2023-02-15 DIAGNOSIS — E1159 Type 2 diabetes mellitus with other circulatory complications: Secondary | ICD-10-CM

## 2023-02-15 DIAGNOSIS — E1122 Type 2 diabetes mellitus with diabetic chronic kidney disease: Secondary | ICD-10-CM

## 2023-02-15 DIAGNOSIS — I5042 Chronic combined systolic (congestive) and diastolic (congestive) heart failure: Secondary | ICD-10-CM

## 2023-02-15 NOTE — Progress Notes (Signed)
 02/22/2023 Name: [name] [name] MRN: [id number] DOB: 09-24-1950  Subjective  Chief Complaint  Patient presents with   Congestive Heart Failure   Diabetes   Hypertension   Hyperlipidemia    Reason for visit: ?  [name] [name] is a 73 y.o. female with a history of diabetes (type 2), who presents today for an initial chronic disease pharmacotherapy visit.? Pertinent PMH also includes AF w RVR (hx embolic stroke s/p thrombectomy/tpa (2022), nonischemic-cardiomyopathy (EF 20% 2022, normalized on GDMT to 60%), HTN, HLD, CKD3, Hx IDA.  Care Team: Primary Care Provider: Hope Merle, MD  Date of Last Diabetes Related Visit:   Recent Summary of Change: 02/01/23: Not taking amlodipine  due to running out of medication. Medication delivery initiated through [facility] for all chronic medications  12/21/22: Unable to get DM Testing supplies for months - Resolved w [facility] pharmacy. Back on Libre CGM 12/17/22: Pt stopped Ozempic  (d/t personal concerns) Reports being unable to schedule endo f/u due to her difficulty getting CGM sensors approved at pharmacy  08/10/22 (Cardiology) ??carvedilol  25mg  BID; +losartan  25 mg qd, d/c amlodipine  (patient did not implement) Feeling the best she has in quite a while since Ozempic . ??Ozempic  1.0 mg Concerned about presence of AF and nuclear stress test reveals markedly decreased LVEF. Schedule her for DCCV 06/2022: Start Ozempic   Medication Access/Adherence: Prescription drug coverage: Payor: MEDICARE / Plan: MEDICARE PART A AND B / Product Type: *No Product type* / .   Since Last visit / History of Present Illness: ?  Patient reports implementing plan from last visit. Feels she is back on track with having all of her medications filled and delivered to her home. Denies missing any medications from her list at this time. Has resumed blood pressure medications without concerns.   Reported DM Regimen: ?  Lantus  (glargine) 30 units daily  (using around 3 pm)   DM medications tried in the past:?  Ozempic  (felt great for awhile though then reports hypoglycemia and 'lump in the throat') Jardiance  (Reports she never started) Rybelsus  (prescribed though patient never started, opted for Ozempic  instead) Metformin (held for renal function previously)  SMBG  Libre 3 : ?  CGM Report (Date Range 1/22 - 2/4) ABG 306 mg/dL; GMI 89.3%; Variability 17.9%; TIR 0%, high 16%, very high 84%, low 0%, very low 0%  Is under the impression that her glucose is very well controled (continues to report it shows doubled .[name] [name] If it were that high, I'd be gone by now.. My weight shows double too. I am not 200 lb, never have been.    Hypo/Hyperglycemia: ?  Symptoms of hypoglycemia since last visit:? no  If yes, it was treated by: previous -  Sandwich, eggs   Symptoms of hyperglycemia since last visit:? no -  Polyuria when sugars are higher - drinks water  which brings down quickly  per pt report  DM Prevention:  Statin: Taking; high intensity.?  History of chronic kidney disease? yes History of albuminuria? yes, last UACR on 06/09/21 = 35 mg/g ACE/ARB - Not taking (previously prescribed by cardiology though patient declined due to frustration with perceived lack of patient education/informed changes); Urine MA/CR Ratio - elevated urinary albumin excretion.  Last eye exam: 04/21/22; Retinopathy Present Last foot exam: No foot exam found Tobacco Use: Never  Immunizations:? Flu: Up to date (Last: 09/15/2022); Pneumococcal: PPSV23 (2021) PCV13 (2019); Shingrix: Up to date (Last: 07/16/2017, 05/16/2017) Diet: Breakfast (6am): 2 eggs + onion Lunch (12-1+: Always greens; Air fried chickens,  potatoes salad, turnip greens Snack (3pm): Crackers/peanut butter  Dinner (7-8 pm): Sandwich, crackers/BP, lean meat Drinks: Water , dilute diet soda (1/2 water , half soda), coffee w almond milk or black + sugar (am)    - Never out to eat (cramps/sick)     -  Avoids frozen food and canned foods    - Avoids greasy/fried goods (boil, bake or air fry).    - Rarely has red meat or pork  CV (CHF, HTN) Reports no active symptoms of heart failure. No dyspnea, orthopnea, paroxysmal nocturnal dyspnea. Does note LE edema though not persistent. Reports this is a daily occurrence, resolved w elevating feet.   No palpitations, chest pain, dizziness, pre-syncope or syncope. Notes a good appetite without bloating, abd distension or early satiety. Reports laying flat at night without difficulty.  Furosemide : Only uses when ankles/feet swell (if not resolved w elevating feet). Uses furosemide  1-2x/month or less.   AF Denies dx Aflutter. My heart beats fast. I don't have a bad heart, I just don't have enough oxygen. ... I always have fluid around my heart because I'm just built different. It's a protective mechanism for me, it protects my heart.  The blood clot did not come from the heart, it came from a fall. Denies hitting her head. Notes she felt dizzy while standing and went to the ground gently though she felt her symptoms were as if someone banged on her head 3 times and therefore is not related to the heart.  Reported CHF/HTN Regimen: ?  Amlodipine  10 mg daily Carvedilol  25 mg twice daily  Furosemide  40 mg daily PRN fluid (rare use 1-2 times per month) Xarelto  20 mg daily Magnesium  Oxide 400 mg daily - Taking PO Iron  (via multivitamin) - stopped taking     HTN/CHF medications tried in the past:?  12/2022: Prescribed losartan  by cardiologist recently though patient declined (felt that it was added w/o her consent) Metoprolol  succinate (reports intolerance) Entresto  (patient self discontinued) Losartan  (held for hyperkalemia)   AF medications tried in the past:?  Eliquis  (switched to Xarelto ) Amiodarone  Metoprolol  succinate   Cardiovascular Risk Reduction History of clinical ASCVD? no History of heart failure? yes (Stopped seeing former  cardiologist) Last ECHO: 12/29/2021 (LVEF 55-60%) History of AF? yes Persistent atypical atrial flutter - though never followed up with cards Cardiology recommended DCCV given previous CHF presumed 2/2 persistent AF though patient declined  History of hyperlipidemia? yes Current BMI: 34.2 kg/m2 (Ht 63 in, Wt 87.6 kg) Taking statin? yes; high intensity (atorvastatin  80 mg) Taking aspirin ? not indicated; Not taking   Taking SGLT-2i? no Taking GLP- 1 RA? no    Patient is not checking their blood pressure at home regularly. Does have a BP cuff at home.  Patient reports she believes pressures usually range 120s/70s mmHg (morning) though had been higher in the setting of running out of BP medication last month. Cannot recall readings since resuming BP medications though notes they have all been >130/80.  Patient denies hypotensive s/sx. No dizziness, lightheadedness.  Patient denies hypertensive symptoms. No headache, chest pain, shortness of breath, visual changes.      _______________________________________________  Objective    Review of Systems:? Limited in the setting of virtual visit Constitutional:? No fever, chills or unintentional weight loss  Cardiovascular:? No chest pain or pressure, shortness of breath, dyspnea on exertion, orthopnea or LE edema  Pulmonary:? No cough or shortness of breath  GI:? No nausea, vomiting, constipation, diarrhea, abdominal pain, dyspepsia, change in bowel habits  Endocrine:? No polyuria, polyphagia or blurred vision    Physical Examination:  Vitals:  Wt Readings from Last 3 Encounters:  12/17/22 193 lb 2 oz (87.6 kg)  09/15/22 191 lb 4 oz (86.8 kg)  08/27/22 175 lb (79.4 kg)   BP Readings from Last 3 Encounters:  12/17/22 122/66  09/15/22 134/68  08/27/22 (!) 136/90   Pulse Readings from Last 3 Encounters:  12/17/22 74  09/15/22 81  08/27/22 85    Labs:?  Lab Results  Component Value Date   HGBA1C 14.4 (H) 12/17/2022   HGBA1C 12.0  (H) 08/25/2022   HGBA1C 10.9 (H) 04/13/2022   GLUCOSE 295 (H) 12/17/2022   MICRALBCREAT 35 (H) 06/09/2021   MICRALBCREAT 30-300 09/06/2017   CREATININE 0.96 12/17/2022   CREATININE 0.98 08/25/2022   CREATININE 1.03 (H) 05/27/2022   GFR 59.72 (L) 12/17/2022   GFR 45.00 (L) 04/22/2022   GFR 56.46 (L) 04/13/2022    Lab Results  Component Value Date   CHOL 147 05/11/2021   LDLCALC 84 05/11/2021   LDLCALC 61 05/13/2020   LDLCALC 101 (H) 07/28/2019   HDL 41 05/11/2021   TRIG 125 05/11/2021   TRIG 46 05/13/2020   TRIG 116 07/28/2019   ALT 18 12/17/2022   ALT 49 (H) 05/27/2022   AST 20 12/17/2022   AST 27 05/27/2022      Chemistry      Component Value Date/Time   NA 134 (L) 12/17/2022 1120   NA 134 08/25/2022 0844   K 4.7 12/17/2022 1120   CL 98 12/17/2022 1120   CO2 26 12/17/2022 1120   BUN 21 12/17/2022 1120   BUN 19 08/25/2022 0844   CREATININE 0.96 12/17/2022 1120      Component Value Date/Time   CALCIUM  9.8 12/17/2022 1120   ALKPHOS 99 12/17/2022 1120   AST 20 12/17/2022 1120   ALT 18 12/17/2022 1120   BILITOT 0.5 12/17/2022 1120   BILITOT 0.2 12/10/2020 1211       The ASCVD Risk score (Arnett DK, et al., 2019) failed to calculate for the following reasons:   Risk score cannot be calculated because patient has a medical history suggesting prior/existing ASCVD  Assessment and Plan:   1. Diabetes, type 2: uncontrolled per last A1c of 14.4% (12/17/22), with goal <7% without hypoglycemia though less stringent goal reasonable if ongoing hypoglycemia despite medication optimization.  Worsened glycemic control seems to be 2/2 patient fear of lows/medication non-compliance. Patient also continues to adjust insulin  doses on her own accord.   Extensively discussed glycemic targets, and goal of maintaining BG in a more optimal range as to protect her kidneys/CV health/vision/nerves, etc though patient continuously denies that her sugars are elevated. Insulin  titration to  appropriate given c/f recurrent hypoglycemia. Patient adamant in adding no new medications to support glycemic control. Will continue discussion at follow up visits.   Current Regimen: Lantus  30 units daily (self-increased) Refusal of medication changes today.   Stop diphenhydramine d/t risk dizziness/sedation - Restart Cetirizine  (Rx sent, should be covered by [facility] Medicaid)  Discussed possible options, outlined below Reviewed s/sx/tx hypoglycemia. Emphasized treatment of lows with a quicker sugar such as juice then may follow with protein-rich snack.  Future Consideration: GLP1-RA: Previous success on Ozempic  0.5 mg (significant weight loss, tolerated very well. Patient preference to avoid). Discussed trial of weaker GLP1 such as Trulicity  though patient is adamant against using GLP1 agents. SGLT2i: Strong recommendation, though opt for RAASi first in the setting of CHF/HTN. Patient refused  discussion of SGLT2i stating there is nothing wrong with her heart/kidney/blood sugar Metformin: Reports previous intolerance to IR formulation. Would trial ER metformin if patient amenable given stable renal function. Patient denies this today.  SU: Not ideal given self-adjustment of her insulin  frequently despite instruction to avoid doing so. TZD: Avoid, Hx HF Insulin : Prandial insulin  not ideal in the setting of non-compliance and self-adjustment of current basal dose against provider advice. Though, refuses all other medications.   Possibly consider a combination product? GLP1+basal insulin  (Xultophy)    2. Cardiorenal risk reduction (CKD/CHF/HTN):   CKD Labs up to date. GFR up from previous, 59.7. K WNL w Na on low end of normal. No furosemide  use since. Hx UACR >30, not on ACE/ARB due to prior refusal.   HTN/AF controlled based on last clinic BP of 122/66 mmHg (12/17/22), goal <130/80 mmHg though home readings less clear. Does not monitor BP at home regularly though has started checking intermittently.  Denies lightheadedness, dizziness, SOB, CP, vision changes. No s/sx bleed/clot. Denies palpitations/flutters.  CHF Denies c/f edema at present though not weighing daily.  Pedal edema fluctuates through the day, usually resolved w elevation of feet. Ongoing suspicion of possible contribution from her amlodipine .  Today we discussed her heart failure and Afib in great detail with goal of reorienting her to her past diagnoses given her adamant denial of both. We discussed LVEF and her prior improvement 2/2 GDMT and thus the importance of maintaining these therapies to keep her heart strong to which she verbalized understanding.  Reviewed 4 pillars of GDMT and their benefits including heart function/morbidity/mortality benefits. After this discussion, we re-visited Cardiologist's recommendation of swapping amlodipine  for an ARB to which patient is agreeable.   Stop amlodipine  Start Valsartan  40 mg daily (starting low, inc based on potassium/BP after labs) Labs scheduled 2 weeks, Patient agreeable for lab visit. UACR, 2 wk BMP, Mg Monitor weights, blood pressure, and heart rate Avoid NSAIDs, caffeine Future Consideration: BB: Conitnue carvedilol . Heart rate WNL precludes further titration at this time.  RAAS: Strong recommendation - consider ARB alone vs Entresto  if covered (potassium on higher end in the past, monitor closely) ARA: Potassium may be barrier. Consider in the future if K WNL on established RAAS therapy.  SGLT2i: Strong recommendation esp in setting of uncontrolled DM and kidney disease   3. ASCVD (primary prevention): LDL elevated on last lipid panel at 84 mg/dL, TG 874 mg/dL (04/19/74 = overdue). LDL goal <70 mg/dL (primary prevention, diabetes). <55 not unreasonable given significant risk factors though addition of medication not ideal at this time, focus on DM and HTN meds today.  Key risk factors include: diabetes, hypertension, hyperlipidemia, BMI >30 kg/m2, and sedentary  lifestyle Current Regimen: atorvastatin  80 mg daily Continue medications today without changes.  Labs Due: Lipid panel, consider at PCP f/u   Follow Up Follow up with clinical pharmacist via phone in 2 weeks for lab results  Patient given direct line for questions regarding medication therapy  Future Appointments  Date Time Provider Department Center  03/11/2023 10:00 AM [facility] CCM PHARMACIST [facility]  04/21/2023  2:30 PM Loreda Hacker, DPM [facility]  05/16/2023  8:50 AM [facility] ANNUAL WELLNESS VISIT [facility]  06/20/2023  8:40 AM [name] Merle, MD [facility]    Manuelita FABIENE [name], PharmD Clinical Pharmacist [facility] [phone]

## 2023-02-18 ENCOUNTER — Other Ambulatory Visit: Payer: Self-pay

## 2023-02-22 ENCOUNTER — Other Ambulatory Visit (HOSPITAL_COMMUNITY): Payer: Self-pay

## 2023-02-22 MED ORDER — VALSARTAN 40 MG PO TABS
40.0000 mg | ORAL_TABLET | Freq: Every day | ORAL | 0 refills | Status: DC
  Filled 2023-02-22: qty 30, 30d supply, fill #0

## 2023-02-22 MED ORDER — CETIRIZINE HCL 10 MG PO TABS
10.0000 mg | ORAL_TABLET | Freq: Every day | ORAL | 11 refills | Status: AC
  Filled 2023-02-22: qty 30, 30d supply, fill #0
  Filled 2023-03-18: qty 30, 30d supply, fill #1
  Filled 2023-03-21: qty 30, 30d supply, fill #2
  Filled 2023-05-02 – 2023-05-10 (×2): qty 30, 30d supply, fill #3
  Filled 2023-06-09: qty 30, 30d supply, fill #4
  Filled 2023-07-02: qty 30, 30d supply, fill #5
  Filled 2023-08-01: qty 30, 30d supply, fill #6
  Filled 2023-08-31: qty 30, 30d supply, fill #7
  Filled 2023-09-26: qty 30, 30d supply, fill #8
  Filled 2023-10-24: qty 30, 30d supply, fill #9
  Filled 2023-11-22: qty 30, 30d supply, fill #10
  Filled 2023-12-19: qty 30, 30d supply, fill #11

## 2023-02-23 ENCOUNTER — Other Ambulatory Visit (HOSPITAL_COMMUNITY): Payer: Self-pay

## 2023-03-09 ENCOUNTER — Other Ambulatory Visit (HOSPITAL_COMMUNITY): Payer: Self-pay

## 2023-03-09 ENCOUNTER — Other Ambulatory Visit: Payer: Self-pay | Admitting: Pharmacist

## 2023-03-09 NOTE — Progress Notes (Signed)
 Called patient to follow up and remind her of routine labs for new valsartan start.   She is NOT taking valsartan at this time.   Patient confirms that pharmacy sent the prescription, though reports she Googled the side effects and she decided not to take it. Side effects she mentions include dizziness/syncope which we reviewed is any blood pressure medication if blood pressure becomes too low.   She also read that it is bad for the kidneys. We discussed this previously in significant detail. Re-reviewed that the medicine is actually protective for the kidneys. We get labs 2-4 weeks after starting to make sure the kidneys are still happy.   Lastly, she again reports that she read the medicine is for heart failure and she denies having history of heart failure despite our ~hour long conversation of this previously. Assured her it is for her blood pressure and that a lot of medicines have multiple possible uses. Still, she no longer wants to try this medication because she perceives that there is nothing wrong with her heart.   ______________________________________________________  Patient also reports today that she has been having trouble again at the pharmacy with her sensors being billed through her insurance. I did speak to pharmacy who deny this. Per fill history here at [facility], she has been getting sensors delivered every month for no copay.   She does also inquire about getting a [place] 3 Reader filled through insurance. [facility] pharmacy. Pharmacy reports they are still getting a PA required message (previously only submitted for the sensors possibly).  - PA request sent to Pharmacy Team - [place] 3 Reader

## 2023-03-10 ENCOUNTER — Telehealth: Payer: Self-pay

## 2023-03-10 ENCOUNTER — Other Ambulatory Visit (HOSPITAL_COMMUNITY): Payer: Self-pay

## 2023-03-10 NOTE — Telephone Encounter (Signed)
 Pharmacy Patient Advocate Encounter  Received notification from [facility] MEDICAID that Prior Authorization for FREESTYLE LIBRE READER  has been APPROVED from 03/10/23 to 09/06/23   PA #/Case ID/Reference #: [id number]

## 2023-03-10 NOTE — Telephone Encounter (Signed)
 Pharmacy Patient Advocate Encounter   Received notification from Physician's Office that prior authorization for FREESTYLE LIBRE READER is required/requested.   Insurance verification completed.   The patient is insured through [facility] MEDICAID .   Per test claim: PA required; PA submitted to above mentioned insurance via [facility] Tracks Key/confirmation #/EOC [id number] W Status is pending

## 2023-03-11 ENCOUNTER — Telehealth: Payer: Self-pay | Admitting: *Deleted

## 2023-03-11 ENCOUNTER — Other Ambulatory Visit: Payer: Medicare Other

## 2023-03-11 NOTE — Telephone Encounter (Signed)
   Name: Raidyn Breiner  DOB: 30-Jan-1950  MRN: [id number]  Primary Cardiologist: None  Chart reviewed as part of pre-operative protocol coverage. Because of Shawnette Augello Alderfer's past medical history and time since last visit, she will require a follow-up in-office visit in order to better assess preoperative cardiovascular risk. Pt is overdue for follow-up.   Pre-op covering staff: - Please schedule appointment and call patient to inform them. If patient already had an upcoming appointment within acceptable timeframe, please add "pre-op clearance" to the appointment notes so provider is aware. - Please contact requesting surgeon's office via preferred method (i.e, phone, fax) to inform them of need for appointment prior to surgery.  This message will also be routed to pharmacy pool for input on holding Xarelto as requested below so that this information is available to the clearing provider at time of patient's appointment.   Joylene Grapes, NP  03/11/2023, 8:08 AM

## 2023-03-11 NOTE — Telephone Encounter (Signed)
Left message to call back to schedule in office appt for pre op clearance.

## 2023-03-11 NOTE — Telephone Encounter (Signed)
 2nd attempt called patient to set up in-office visit for a pre-op clearance, no answer left a vm

## 2023-03-11 NOTE — Telephone Encounter (Signed)
   Pre-operative Risk Assessment    Patient Name: [name] [name]  DOB: 14-Mar-1950 MRN: [id number]   Date of last office visit: 7/30/224 Date of next office visit: NONE   Request for Surgical Clearance    Procedure:   COLONOSCOPY  Date of Surgery:  Clearance TBD                                Surgeon:  DR. Charlott Rakes Surgeon's Group or Practice Name:  [facility] Phone number:  [phone] Fax number:  [phone]   Type of Clearance Requested:   - Medical  - Pharmacy:  Hold Rivaroxaban (Xarelto) NOT INDICATED   Type of Anesthesia:   PROPOFOL   Additional requests/questions:    Wilhemina Cash   03/11/2023, 6:57 AM

## 2023-03-11 NOTE — Telephone Encounter (Signed)
 Patient with diagnosis of afib on Xarelto for anticoagulation.    Procedure: COLONOSCOPY  Date of procedure: TBD   CHA2DS2-VASc Score = 7   This indicates a 11.2% annual risk of stroke. The patient's score is based upon: CHF History: 1 HTN History: 1 Diabetes History: 1 Stroke History: 2 Vascular Disease History: 0 Age Score: 1 Gender Score: 1       Underwent cardioversion on 08/27/22 Stroke 05/2020  CrCl 56 ml/min Platelet count 167  Per office protocol, patient can hold Xarelto for 2 days prior to procedure.    **This guidance is not considered finalized until pre-operative APP has relayed final recommendations.**

## 2023-03-15 NOTE — Telephone Encounter (Signed)
 3rd attempt to reach the pt to schedule in office appt for preop clearance.   I will update the requesting office pt needs to call and schedule appt in office.   Will remove from the preop call back pool.

## 2023-03-16 ENCOUNTER — Ambulatory Visit: Payer: Medicare Other | Admitting: Family Medicine

## 2023-03-17 ENCOUNTER — Other Ambulatory Visit: Payer: Self-pay

## 2023-03-17 ENCOUNTER — Other Ambulatory Visit: Payer: Self-pay | Admitting: Pharmacist

## 2023-03-17 DIAGNOSIS — E1159 Type 2 diabetes mellitus with other circulatory complications: Secondary | ICD-10-CM

## 2023-03-20 MED ORDER — AMLODIPINE BESYLATE 10 MG PO TABS
10.0000 mg | ORAL_TABLET | Freq: Every day | ORAL | 0 refills | Status: DC
  Filled 2023-03-20 – 2023-05-10 (×4): qty 90, 90d supply, fill #0

## 2023-03-21 ENCOUNTER — Other Ambulatory Visit (HOSPITAL_COMMUNITY): Payer: Self-pay

## 2023-03-21 ENCOUNTER — Other Ambulatory Visit: Payer: Self-pay | Admitting: Family Medicine

## 2023-03-22 ENCOUNTER — Other Ambulatory Visit (HOSPITAL_COMMUNITY): Payer: Self-pay

## 2023-03-22 ENCOUNTER — Other Ambulatory Visit: Payer: Self-pay | Admitting: Gastroenterology

## 2023-03-22 ENCOUNTER — Other Ambulatory Visit: Payer: Self-pay

## 2023-03-22 DIAGNOSIS — Z860101 Personal history of adenomatous and serrated colon polyps: Secondary | ICD-10-CM | POA: Insufficient documentation

## 2023-03-22 MED ORDER — FREESTYLE LIBRE 3 SENSOR MISC
2 refills | Status: DC
  Filled 2023-03-22: qty 2, 28d supply, fill #0
  Filled 2023-04-13: qty 2, 28d supply, fill #1
  Filled 2023-05-10 (×2): qty 2, 28d supply, fill #2

## 2023-03-30 NOTE — Telephone Encounter (Signed)
 Requesting office sent a duplicate request only change is form has a date of 05/03/23. Please see all previous notes. From the original request sent to our office 03/11/23, it was noted that the pt needs an in office appt. We have attempted x 3 to reach the pt, who has not called back to schedule appt in the office. I will send notes to requesting office that if they s/w the pt let her know please she needs to call the office to schedule appt in office with Dr. Jacinto Halim or APP.

## 2023-04-01 ENCOUNTER — Other Ambulatory Visit (HOSPITAL_COMMUNITY): Payer: Self-pay

## 2023-04-08 ENCOUNTER — Other Ambulatory Visit: Payer: Medicare Other

## 2023-04-13 ENCOUNTER — Other Ambulatory Visit (HOSPITAL_COMMUNITY): Payer: Self-pay

## 2023-04-19 ENCOUNTER — Telehealth: Payer: Self-pay

## 2023-04-19 ENCOUNTER — Other Ambulatory Visit: Payer: Self-pay | Admitting: Gastroenterology

## 2023-04-19 NOTE — Telephone Encounter (Signed)
 We received a request for patient to stop taking medication prior to her colonoscopy.  I sent a copy to Dr. Astrid Divine folder on the S drive and hand delivered a copy to [employer], CMA.

## 2023-04-19 NOTE — Telephone Encounter (Signed)
 Copied from CRM [id number]. Topic: General - Other >> Apr 19, 2023  1:57 PM Abigail D wrote: Reason for CRM: [facility] calling for status of Cardiac Clearance faxed over a few times, originally sent on 2/27 and hasn't received any word back. will fax again.

## 2023-04-19 NOTE — Telephone Encounter (Signed)
 Called GI and waiting on a call back from Shakira to let them know that cardiology has been trying to reach pt to get her scheduled for surgical clearance.

## 2023-04-21 ENCOUNTER — Ambulatory Visit (INDEPENDENT_AMBULATORY_CARE_PROVIDER_SITE_OTHER): Payer: Medicare Other | Admitting: Podiatry

## 2023-04-21 ENCOUNTER — Encounter: Payer: Self-pay | Admitting: Podiatry

## 2023-04-21 VITALS — Ht 63.0 in | Wt 193.1 lb

## 2023-04-21 DIAGNOSIS — M79675 Pain in left toe(s): Secondary | ICD-10-CM

## 2023-04-21 DIAGNOSIS — E1165 Type 2 diabetes mellitus with hyperglycemia: Secondary | ICD-10-CM

## 2023-04-21 DIAGNOSIS — Z7901 Long term (current) use of anticoagulants: Secondary | ICD-10-CM

## 2023-04-21 DIAGNOSIS — B351 Tinea unguium: Secondary | ICD-10-CM

## 2023-04-21 DIAGNOSIS — M79674 Pain in right toe(s): Secondary | ICD-10-CM

## 2023-04-21 NOTE — Progress Notes (Signed)
 This patient presents to the office with chief complaint of long thick nails.   This patient  says there  is  no pain and discomfort in her feet.  This patient says there are long thick painful big toenails   These nails are painful walking and wearing shoes.  . This patient presents  to the office today for treatment of the  long nails.  Patient is diabetic.  General Appearance  Alert, conversant and in no acute stress.  Vascular  Dorsalis pedis and posterior tibial  pulses are palpable  bilaterally.  Capillary return is within normal limits  bilaterally. Temperature is within normal limits  bilaterally.  Neurologic  Senn-Weinstein monofilament wire test within normal limits  bilaterally. Muscle power within normal limits bilaterally.  Nails Thick disfigured discolored nails with subungual debris  hallux nails bilaterally. No evidence of bacterial infection or drainage bilaterally.  Orthopedic  No limitations of motion of motion feet .  No crepitus or effusions noted.  Mild  HAV  B/L.  Skin  normotropic skin with no porokeratosis noted bilaterally.  No signs of infections or ulcers noted.     Onychomycosis  Diabetes with no foot complications  Debride nails x 10.     RTC 12 weeks    Helane Gunther DPM

## 2023-04-22 ENCOUNTER — Telehealth: Payer: Self-pay | Admitting: Family Medicine

## 2023-04-22 NOTE — Telephone Encounter (Signed)
 Called pt and she said that is she not going to have it done because she stated that they changed dr for the colonoscopy and she did not like that.

## 2023-04-22 NOTE — Telephone Encounter (Unsigned)
 Copied from CRM [id number]. Topic: General - Call Back - No Documentation >> Apr 22, 2023  8:42 AM Darl Householder wrote: Reason for CRM: [name] [name] calling to see if surgical clearance was received- prefer to speak to nurse, callback number: [phone] ext [phone]

## 2023-04-27 ENCOUNTER — Other Ambulatory Visit (HOSPITAL_COMMUNITY): Payer: Self-pay

## 2023-04-30 ENCOUNTER — Other Ambulatory Visit (HOSPITAL_COMMUNITY): Payer: Self-pay

## 2023-05-02 ENCOUNTER — Other Ambulatory Visit (HOSPITAL_COMMUNITY): Payer: Self-pay

## 2023-05-03 ENCOUNTER — Ambulatory Visit (HOSPITAL_COMMUNITY): Admit: 2023-05-03 | Admitting: Gastroenterology

## 2023-05-03 ENCOUNTER — Other Ambulatory Visit: Payer: Self-pay

## 2023-05-03 ENCOUNTER — Ambulatory Visit (HOSPITAL_COMMUNITY): Admission: RE | Admit: 2023-05-03 | Source: Home / Self Care | Admitting: Gastroenterology

## 2023-05-03 ENCOUNTER — Encounter (HOSPITAL_COMMUNITY): Admission: RE | Payer: Self-pay | Source: Home / Self Care

## 2023-05-03 ENCOUNTER — Encounter (HOSPITAL_COMMUNITY): Payer: Self-pay

## 2023-05-03 DIAGNOSIS — Z860101 Personal history of adenomatous and serrated colon polyps: Secondary | ICD-10-CM

## 2023-05-03 SURGERY — COLONOSCOPY
Anesthesia: Monitor Anesthesia Care

## 2023-05-05 ENCOUNTER — Other Ambulatory Visit: Payer: Self-pay

## 2023-05-10 ENCOUNTER — Other Ambulatory Visit: Payer: Self-pay

## 2023-05-10 ENCOUNTER — Other Ambulatory Visit (HOSPITAL_COMMUNITY): Payer: Self-pay

## 2023-05-16 ENCOUNTER — Telehealth: Payer: Self-pay | Admitting: *Deleted

## 2023-05-16 NOTE — Telephone Encounter (Signed)
 Called patient to perform AWV. Patient stated  late Friday evening she had to to go to the hospital because she started feeling real bad. Patient stated that she was admitted because of elevated heart rate and blood pressure. Patient stated that she has fluid around her lungs. Patient stated that she is not sure when she will be going home but will call and schedule a follow-up with Dr. Sueanne Emerald when she is released. Patient stated that she is in the hospital in [place]. Patient was advised that we hope that she gets to feeling better soon and is able to go home.

## 2023-05-17 ENCOUNTER — Other Ambulatory Visit (HOSPITAL_COMMUNITY): Payer: Self-pay

## 2023-05-24 ENCOUNTER — Telehealth: Payer: Self-pay

## 2023-05-24 NOTE — Telephone Encounter (Signed)
 Copied from CRM [id number]. Topic: Clinical - Medical Advice >> May 24, 2023 12:51 PM [place] J wrote: Reason for CRM: Genevia Kern calling in from [facility] wanting to let Dr. Sueanne Emerald know that the patient is still having a cough and crackling in her left lung. They were also wondering if a breathing treatment is something to look into at the time of the patient's appointment on the 15th.  Please call Genevia Kern back at: [phone]

## 2023-05-24 NOTE — Telephone Encounter (Signed)
 Spoke to Dickinson at [facility] to let her know that Dr. Sueanne Emerald states the patient needs to be seen and evaluated. [name] [name] states she will pass this information to the patient.

## 2023-05-26 ENCOUNTER — Ambulatory Visit (INDEPENDENT_AMBULATORY_CARE_PROVIDER_SITE_OTHER): Admitting: Family Medicine

## 2023-05-26 ENCOUNTER — Other Ambulatory Visit: Payer: Self-pay

## 2023-05-26 ENCOUNTER — Encounter: Payer: Self-pay | Admitting: Family Medicine

## 2023-05-26 ENCOUNTER — Other Ambulatory Visit (HOSPITAL_COMMUNITY): Payer: Self-pay

## 2023-05-26 VITALS — BP 110/60 | HR 71 | Temp 98.3°F | Resp 20 | Ht 63.0 in | Wt 194.1 lb

## 2023-05-26 DIAGNOSIS — I5042 Chronic combined systolic (congestive) and diastolic (congestive) heart failure: Secondary | ICD-10-CM

## 2023-05-26 DIAGNOSIS — R059 Cough, unspecified: Secondary | ICD-10-CM

## 2023-05-26 DIAGNOSIS — E1159 Type 2 diabetes mellitus with other circulatory complications: Secondary | ICD-10-CM | POA: Diagnosis not present

## 2023-05-26 DIAGNOSIS — I4891 Unspecified atrial fibrillation: Secondary | ICD-10-CM

## 2023-05-26 DIAGNOSIS — Z794 Long term (current) use of insulin: Secondary | ICD-10-CM | POA: Diagnosis not present

## 2023-05-26 DIAGNOSIS — I152 Hypertension secondary to endocrine disorders: Secondary | ICD-10-CM

## 2023-05-26 DIAGNOSIS — E1169 Type 2 diabetes mellitus with other specified complication: Secondary | ICD-10-CM

## 2023-05-26 DIAGNOSIS — E1165 Type 2 diabetes mellitus with hyperglycemia: Secondary | ICD-10-CM

## 2023-05-26 DIAGNOSIS — R600 Localized edema: Secondary | ICD-10-CM

## 2023-05-26 DIAGNOSIS — E785 Hyperlipidemia, unspecified: Secondary | ICD-10-CM

## 2023-05-26 LAB — CBC WITH DIFFERENTIAL/PLATELET
Basophils Absolute: 0.1 10*3/uL (ref 0.0–0.1)
Basophils Relative: 0.9 % (ref 0.0–3.0)
Eosinophils Absolute: 0.2 10*3/uL (ref 0.0–0.7)
Eosinophils Relative: 2.5 % (ref 0.0–5.0)
HCT: 38.4 % (ref 36.0–46.0)
Hemoglobin: 12.1 g/dL (ref 12.0–15.0)
Lymphocytes Relative: 30.5 % (ref 12.0–46.0)
Lymphs Abs: 2.2 10*3/uL (ref 0.7–4.0)
MCHC: 31.6 g/dL (ref 30.0–36.0)
MCV: 78.8 fl (ref 78.0–100.0)
Monocytes Absolute: 0.7 10*3/uL (ref 0.1–1.0)
Monocytes Relative: 10.3 % (ref 3.0–12.0)
Neutro Abs: 4 10*3/uL (ref 1.4–7.7)
Neutrophils Relative %: 55.8 % (ref 43.0–77.0)
Platelets: 262 10*3/uL (ref 150.0–400.0)
RBC: 4.87 Mil/uL (ref 3.87–5.11)
RDW: 14.7 % (ref 11.5–15.5)
WBC: 7.2 10*3/uL (ref 4.0–10.5)

## 2023-05-26 LAB — COMPREHENSIVE METABOLIC PANEL WITH GFR
ALT: 14 U/L (ref 0–35)
AST: 18 U/L (ref 0–37)
Albumin: 3.5 g/dL (ref 3.5–5.2)
Alkaline Phosphatase: 68 U/L (ref 39–117)
BUN: 15 mg/dL (ref 6–23)
CO2: 33 meq/L — ABNORMAL HIGH (ref 19–32)
Calcium: 9.1 mg/dL (ref 8.4–10.5)
Chloride: 94 meq/L — ABNORMAL LOW (ref 96–112)
Creatinine, Ser: 0.77 mg/dL (ref 0.40–1.20)
GFR: 77.58 mL/min (ref >60.00)
Glucose, Bld: 253 mg/dL — ABNORMAL HIGH (ref 70–99)
Potassium: 4.6 meq/L (ref 3.5–5.1)
Sodium: 135 meq/L (ref 135–145)
Total Bilirubin: 0.4 mg/dL (ref 0.2–1.2)
Total Protein: 7.5 g/dL (ref 6.0–8.3)

## 2023-05-26 LAB — MICROALBUMIN / CREATININE URINE RATIO
Creatinine,U: 26.2 mg/dL
Microalb Creat Ratio: 87.3 mg/g — ABNORMAL HIGH (ref 0.0–30.0)
Microalb, Ur: 2.3 mg/dL — ABNORMAL HIGH (ref 0.0–1.9)

## 2023-05-26 LAB — HEMOGLOBIN A1C: Hgb A1c MFr Bld: 12.9 % — ABNORMAL HIGH (ref 4.6–6.5)

## 2023-05-26 MED ORDER — BENZONATATE 100 MG PO CAPS
200.0000 mg | ORAL_CAPSULE | Freq: Three times a day (TID) | ORAL | 0 refills | Status: AC | PRN
  Filled 2023-05-26: qty 20, 4d supply, fill #0

## 2023-05-26 MED ORDER — OMEPRAZOLE 20 MG PO CPDR
20.0000 mg | DELAYED_RELEASE_CAPSULE | Freq: Every day | ORAL | 3 refills | Status: AC
  Filled 2023-05-26: qty 90, 90d supply, fill #0

## 2023-05-26 MED ORDER — FREESTYLE LIBRE 3 SENSOR MISC
2 refills | Status: AC
  Filled 2023-05-26 – 2023-06-03 (×2): qty 2, 28d supply, fill #0
  Filled 2023-06-30: qty 2, 28d supply, fill #1
  Filled 2023-08-03: qty 2, 28d supply, fill #2

## 2023-05-26 NOTE — Patient Instructions (Signed)
 It was a pleasure meeting you today. Thank you for allowing me to take part in your health care.  Our goals for today as we discussed include:  We will get some labs today.  If they are abnormal or we need to do something about them, I will call you.  If they are normal, I will send you a message on MyChart (if it is active) or a letter in the mail.  If you don't hear from us  in 2 weeks, please call the office at the number below.   Tessalon perls for cough  You can take Tylenol  and/or Ibuprofen as needed for fever reduction and pain relief.   For cough: honey 1/2 to 1 teaspoon (you can dilute the honey in water  or another fluid).  You can also use guaifenesin  and dextromethorphan for cough. You can use a humidifier for chest congestion and cough.  If you don't have a humidifier, you can sit in the bathroom with the hot shower running.      For sore throat: try warm salt water  gargles, cepacol lozenges, throat spray, warm tea or water  with lemon/honey, popsicles or ice, or OTC cold relief medicine for throat discomfort.   For congestion: take a daily anti-histamine like Zyrtec , Claritin, and a oral decongestant, such as pseudoephedrine.  You can also use Flonase 1-2 sprays in each nostril daily.   It is important to stay hydrated: drink plenty of fluids (water , gatorade/powerade/pedialyte, juices, or teas) to keep your throat moisturized and help further relieve irritation/discomfort.   Will need records from [place] .  Please have them sent to PCP.   Will refer to Cardiology, Pulmonology and Pharmacy    This is a list of the screening recommended for you and due dates:  Health Maintenance  Topic Date Due   Colon Cancer Screening  Never done   COVID-19 Vaccine (3 - Moderna risk series) 09/04/2019   Yearly kidney health urinalysis for diabetes  06/10/2022   Complete foot exam   06/10/2022   Eye exam for diabetics  04/21/2023   Medicare Annual Wellness Visit  05/14/2023   Hemoglobin A1C   06/17/2023   Flu Shot  08/12/2023   Yearly kidney function blood test for diabetes  12/17/2023   Mammogram  06/03/2024   DTaP/Tdap/Td vaccine (2 - Td or Tdap) 05/14/2027   Pneumonia Vaccine  Completed   DEXA scan (bone density measurement)  Completed   Hepatitis C Screening  Completed   Zoster (Shingles) Vaccine  Completed   HPV Vaccine  Aged Out   Meningitis B Vaccine  Aged Out      If you have any questions or concerns, please do not hesitate to call the office at [phone].  I look forward to our next visit and until then take care and stay safe.  Regards,   Valli Gaw, MD   [facility]

## 2023-06-03 ENCOUNTER — Other Ambulatory Visit: Payer: Self-pay

## 2023-06-03 ENCOUNTER — Other Ambulatory Visit (HOSPITAL_COMMUNITY): Payer: Self-pay

## 2023-06-03 ENCOUNTER — Ambulatory Visit: Payer: Self-pay | Admitting: Family Medicine

## 2023-06-03 ENCOUNTER — Encounter: Payer: Self-pay | Admitting: Family Medicine

## 2023-06-03 DIAGNOSIS — R059 Cough, unspecified: Secondary | ICD-10-CM

## 2023-06-03 NOTE — Assessment & Plan Note (Signed)
 Hypertension exacerbated during hospitalization. Managed with Carvedilol  and Lasix . Monitoring and adherence critical to prevent complications. - Continue Carvedilol  25 mg BID  - Continue Lasix  40 mg  - Continue to follow with Cardiology, was previously seeing Dr Berry Bristol.  Patient reports seeing Cardiologist in [place] 

## 2023-06-03 NOTE — Assessment & Plan Note (Addendum)
 Atrial fibrillation with recent hospitalization for elevated heart rate, hypertension, and hyperglycemia. On Eliquis  and amiodarone  per cardiology. Cardiology follow-up needed for treatment clarification. - Switched from Xarelto  to Eliquis  from recent hospitalization in [place]  - Restarted on Amiodarone  as per cardiology recommendation. - Refer to cardiology for further evaluation and management.

## 2023-06-03 NOTE — Assessment & Plan Note (Signed)
 Diabetes managed with insulin . Previous Ozempic  use caused hypoglycemia.  Currently not seeing endocrinologist due to issues obtaining glucose sensor. - Continue insulin  injections at 30 units as prescribed. - Coordinate with endocrinology for diabetes management and sensor acquisition.

## 2023-06-03 NOTE — Assessment & Plan Note (Signed)
 Improved

## 2023-06-03 NOTE — Assessment & Plan Note (Signed)
Refer to Pulmonology.

## 2023-06-03 NOTE — Assessment & Plan Note (Signed)
Chronic On statin and tolerating well Continue Lipitor 80 mg daily Check fasting lipids at next visit

## 2023-06-03 NOTE — Assessment & Plan Note (Addendum)
 Chronic.   On Lasix  40 mg daily  Continue to follow up with Cardiology Dr. Ranny Bye MD in [place].

## 2023-06-03 NOTE — Progress Notes (Signed)
 SUBJECTIVE:   Chief Complaint  Patient presents with   Hospitalization Follow-up   HPI Presents to clinic for follow up recent hospitalization   Discussed the use of AI scribe software for clinical note transcription with the patient, who gave verbal consent to proceed.  History of Present Illness [name] [name] is a 73 year old female with atrial fibrillation and diabetes who presents with persistent cough and congestion.  She has been experiencing a persistent cough and congestion since her recent hospitalization in [place] . The cough is severe, causing gagging, and is accompanied by congestion. She received breathing treatments in the hospital, which were helpful, but symptoms persist.  She was hospitalized recently due to feeling extremely unwell, with symptoms including elevated heart rate, high blood pressure, and high blood sugar. During this episode, she was so weak that her grandson had to assist her. She was discharged from the hospital last Wednesday.  Her current medications include carvedilol  for blood pressure, Eliquis  for atrial fibrillation, Lasix  as a diuretic, and amiodarone , which she was previously on and then taken off. She is also on insulin , administering 30 units, and has had issues with her endocrinologist regarding her diabetes management. She was previously on Ozempic  but experienced hypoglycemia, leading to discontinuation.  She has a history of multiple changes in her cardiology care, with her heart doctor having moved, and she is currently unsure of her cardiologist's identity.  Her social history includes plans to move to [place]  to be closer to family, as she has no remaining family in [place]. She has a brother in [place]  who assists her, and she has been traveling back and forth between the two locations.     PERTINENT PMH / PSH: As above  OBJECTIVE:  BP 110/60   Pulse 71   Temp 98.3 F (36.8 C)   Resp 20   Ht 5\' 3"  (1.6 m)   Wt  194 lb 2 oz (88.1 kg)   SpO2 98%   BMI 34.39 kg/m    Physical Exam Vitals reviewed.  Constitutional:      General: She is not in acute distress.    Appearance: Normal appearance. She is obese. She is not ill-appearing, toxic-appearing or diaphoretic.  Eyes:     General:        Right eye: No discharge.        Left eye: No discharge.     Conjunctiva/sclera: Conjunctivae normal.  Cardiovascular:     Rate and Rhythm: Normal rate and regular rhythm.     Heart sounds: Normal heart sounds.  Pulmonary:     Effort: Pulmonary effort is normal.     Breath sounds: Normal breath sounds. No wheezing or rhonchi.  Abdominal:     General: Bowel sounds are normal.  Musculoskeletal:        General: Normal range of motion.  Skin:    General: Skin is warm and dry.  Neurological:     General: No focal deficit present.     Mental Status: She is alert and oriented to person, place, and time. Mental status is at baseline.  Psychiatric:        Mood and Affect: Mood normal.        Behavior: Behavior normal.        Thought Content: Thought content normal.        Judgment: Judgment normal.           05/26/2023   10:48 AM 12/17/2022   10:03 AM 09/15/2022  2:51 PM 06/10/2022    4:16 PM 05/14/2022    8:29 AM  Depression screen PHQ 2/9  Decreased Interest 0 0 0 0 0  Down, Depressed, Hopeless 0 0 0 0 0  PHQ - 2 Score 0 0 0 0 0  Altered sleeping 2 0 1 1   Tired, decreased energy 2 0 0 1   Change in appetite  0 1 0   Feeling bad or failure about yourself  0 0 0 0   Trouble concentrating 0 0 0 0   Moving slowly or fidgety/restless 1 0  0   Suicidal thoughts 0 0 0 0   PHQ-9 Score 5 0 2 2   Difficult doing work/chores Not difficult at all Not difficult at all Not difficult at all Somewhat difficult       05/26/2023   10:48 AM 12/17/2022   10:03 AM 09/15/2022    2:52 PM 06/10/2022    4:17 PM  GAD 7 : Generalized Anxiety Score  Nervous, Anxious, on Edge 0 0 0 0  Control/stop worrying 0 0 0 0   Worry too much - different things 0 0 0 0  Trouble relaxing 0 0 0 1  Restless 0 0 0 0  Easily annoyed or irritable 0 0 0 1  Afraid - awful might happen 0 0 0 0  Total GAD 7 Score 0 0 0 2  Anxiety Difficulty Not difficult at all Not difficult at all Not difficult at all Not difficult at all    ASSESSMENT/PLAN:  Type 2 diabetes mellitus with hyperglycemia, with long-term current use of insulin  [facility]) Assessment & Plan: Diabetes managed with insulin . Previous Ozempic  use caused hypoglycemia.  Currently not seeing endocrinologist due to issues obtaining glucose sensor. - Continue insulin  injections at 30 units as prescribed. - Coordinate with endocrinology for diabetes management and sensor acquisition.    Orders: -     Hemoglobin A1c -     Microalbumin / creatinine urine ratio  Hypertension associated with diabetes (HCC) Assessment & Plan: Hypertension exacerbated during hospitalization. Managed with Carvedilol  and Lasix . Monitoring and adherence critical to prevent complications. - Continue Carvedilol  25 mg BID  - Continue Lasix  40 mg  - Continue to follow with Cardiology, was previously seeing Dr Berry Bristol.  Patient reports seeing Cardiologist in [place]   Orders: -     Comprehensive metabolic panel with GFR  Atrial fibrillation with RVR (HCC) Assessment & Plan: Atrial fibrillation with recent hospitalization for elevated heart rate, hypertension, and hyperglycemia. On Eliquis  and amiodarone  per cardiology. Cardiology follow-up needed for treatment clarification. - Switched from Xarelto  to Eliquis  from recent hospitalization in [place]  - Restarted on Amiodarone  as per cardiology recommendation. - Refer to cardiology for further evaluation and management.  Orders: -     CBC with Differential/Platelet -     Ambulatory referral to Cardiology  Cough, unspecified type -     Benzonatate ; Take 2 capsules (200 mg total) by mouth 3 (three) times daily as needed.  Dispense: 20 capsule;  Refill: 0 -     Pulmonary Visit  Chronic combined systolic and diastolic CHF (congestive heart failure) (HCC) Assessment & Plan: Chronic.   On Lasix  40 mg daily  Continue to follow up with Cardiology Dr. Ranny Bye MD in [place].   Orders: -     Ambulatory referral to Cardiology -     Pulmonary Visit  Hyperlipidemia associated with type 2 diabetes mellitus (HCC) Assessment & Plan: Chronic On statin and tolerating well  Continue Lipitor  80 mg daily Check fasting lipids at next visit   Bilateral lower extremity edema Assessment & Plan: Improved    Other orders -     Omeprazole ; Take 1 capsule (20 mg total) by mouth daily.  Dispense: 90 capsule; Refill: 3 -     FreeStyle Libre 3 Sensor; Place 1 sensor on the skin every 14 days. Use to check glucose continuously.  E11.9, Z79.4  Dispense: 2 each; Refill: 2    PDMP reviewed  Return in about 3 months (around 08/26/2023) for PCP.  Valli Gaw, MD

## 2023-06-09 ENCOUNTER — Other Ambulatory Visit (HOSPITAL_COMMUNITY): Payer: Self-pay

## 2023-06-11 ENCOUNTER — Other Ambulatory Visit (HOSPITAL_COMMUNITY): Payer: Self-pay

## 2023-06-16 ENCOUNTER — Telehealth: Payer: Self-pay

## 2023-06-16 NOTE — Telephone Encounter (Signed)
 Spoke to pt and she advised he she can not be seen until she has her records sent over. I called the office and they let me know that they just needed the last office note sent to them. I have faxed over the last office visit with snapshot to office.

## 2023-06-16 NOTE — Telephone Encounter (Signed)
 Noted

## 2023-06-16 NOTE — Telephone Encounter (Signed)
 Copied from CRM [id number]. Topic: Medical Record Request - Other >> Jun 16, 2023  2:07 PM Jenice Mitts wrote: Reason for CRM: Patient says that her medical records request was sent to the office and the fax said it was received. She was transferred the the medical records department and they have not received anything. Patient is getting low on her medication   Please call her with an update at [phone] (M)

## 2023-06-16 NOTE — Telephone Encounter (Signed)
 Faxed over last office Visit to [facility] at fax number [phone]

## 2023-06-16 NOTE — Telephone Encounter (Signed)
 Copied from CRM [id number]. Topic: Medical Record Request - Records Request >> Jun 16, 2023 12:13 PM [name] [name] wrote: Reason for CRM: Patient requesting to speak to [facility] regarding medical records request, says the office in [place] requested the records from the office but still hasn't received them. They will not see her until they have her records and she needs medications refilled. Please call her with an update at [phone] (M)  I spoke with patient and transferred her to Medical Records.

## 2023-06-20 ENCOUNTER — Ambulatory Visit: Payer: Medicare Other | Admitting: Family Medicine

## 2023-06-30 ENCOUNTER — Other Ambulatory Visit: Payer: Self-pay

## 2023-07-02 ENCOUNTER — Other Ambulatory Visit (HOSPITAL_COMMUNITY): Payer: Self-pay

## 2023-07-07 ENCOUNTER — Other Ambulatory Visit (HOSPITAL_COMMUNITY): Payer: Self-pay

## 2023-07-13 ENCOUNTER — Other Ambulatory Visit (HOSPITAL_COMMUNITY): Payer: Self-pay

## 2023-07-16 ENCOUNTER — Other Ambulatory Visit (HOSPITAL_COMMUNITY): Payer: Self-pay

## 2023-07-21 ENCOUNTER — Ambulatory Visit: Admitting: Podiatry

## 2023-07-26 ENCOUNTER — Other Ambulatory Visit (HOSPITAL_COMMUNITY): Payer: Self-pay

## 2023-07-27 ENCOUNTER — Other Ambulatory Visit (HOSPITAL_COMMUNITY): Payer: Self-pay

## 2023-07-28 ENCOUNTER — Other Ambulatory Visit (HOSPITAL_COMMUNITY): Payer: Self-pay

## 2023-07-29 ENCOUNTER — Other Ambulatory Visit (HOSPITAL_COMMUNITY): Payer: Self-pay

## 2023-07-30 ENCOUNTER — Other Ambulatory Visit (HOSPITAL_COMMUNITY): Payer: Self-pay

## 2023-08-01 ENCOUNTER — Other Ambulatory Visit (HOSPITAL_COMMUNITY): Payer: Self-pay

## 2023-08-03 ENCOUNTER — Other Ambulatory Visit (HOSPITAL_COMMUNITY): Payer: Self-pay

## 2023-08-06 ENCOUNTER — Other Ambulatory Visit (HOSPITAL_COMMUNITY): Payer: Self-pay

## 2023-08-09 ENCOUNTER — Other Ambulatory Visit (HOSPITAL_COMMUNITY): Payer: Self-pay

## 2023-08-09 ENCOUNTER — Other Ambulatory Visit (HOSPITAL_BASED_OUTPATIENT_CLINIC_OR_DEPARTMENT_OTHER): Payer: Self-pay

## 2023-08-10 ENCOUNTER — Other Ambulatory Visit (HOSPITAL_COMMUNITY): Payer: Self-pay

## 2023-08-11 ENCOUNTER — Other Ambulatory Visit (HOSPITAL_COMMUNITY): Payer: Self-pay

## 2023-08-11 ENCOUNTER — Other Ambulatory Visit (HOSPITAL_BASED_OUTPATIENT_CLINIC_OR_DEPARTMENT_OTHER): Payer: Self-pay

## 2023-08-11 ENCOUNTER — Other Ambulatory Visit: Payer: Self-pay

## 2023-08-31 ENCOUNTER — Other Ambulatory Visit (HOSPITAL_COMMUNITY): Payer: Self-pay

## 2023-09-19 ENCOUNTER — Other Ambulatory Visit (HOSPITAL_COMMUNITY): Payer: Self-pay

## 2023-09-26 ENCOUNTER — Other Ambulatory Visit (HOSPITAL_COMMUNITY): Payer: Self-pay

## 2023-10-24 ENCOUNTER — Other Ambulatory Visit (HOSPITAL_COMMUNITY): Payer: Self-pay

## 2023-10-24 ENCOUNTER — Other Ambulatory Visit: Payer: Self-pay

## 2023-10-25 ENCOUNTER — Other Ambulatory Visit (HOSPITAL_COMMUNITY): Payer: Self-pay

## 2023-10-26 ENCOUNTER — Other Ambulatory Visit (HOSPITAL_COMMUNITY): Payer: Self-pay

## 2023-10-27 ENCOUNTER — Other Ambulatory Visit (HOSPITAL_COMMUNITY): Payer: Self-pay

## 2023-10-28 ENCOUNTER — Other Ambulatory Visit (HOSPITAL_COMMUNITY): Payer: Self-pay

## 2023-10-29 ENCOUNTER — Other Ambulatory Visit (HOSPITAL_COMMUNITY): Payer: Self-pay

## 2023-10-30 ENCOUNTER — Other Ambulatory Visit (HOSPITAL_COMMUNITY): Payer: Self-pay

## 2023-11-03 ENCOUNTER — Other Ambulatory Visit (HOSPITAL_COMMUNITY): Payer: Self-pay

## 2023-11-22 ENCOUNTER — Other Ambulatory Visit: Payer: Self-pay | Admitting: Nurse Practitioner

## 2023-11-22 DIAGNOSIS — Z794 Long term (current) use of insulin: Secondary | ICD-10-CM

## 2023-12-01 NOTE — Telephone Encounter (Signed)
 open in error

## 2024-01-15 ENCOUNTER — Other Ambulatory Visit: Payer: Self-pay

## 2024-02-03 ENCOUNTER — Other Ambulatory Visit: Payer: Self-pay
